# Patient Record
Sex: Female | Born: 1935
Health system: Southern US, Community
[De-identification: ages and names within clinical notes are randomized; demographics above are authoritative.]

## PROBLEM LIST (undated history)

## (undated) DIAGNOSIS — M545 Low back pain, unspecified: Secondary | ICD-10-CM

## (undated) DIAGNOSIS — K5790 Diverticulosis of intestine, part unspecified, without perforation or abscess without bleeding: Secondary | ICD-10-CM

## (undated) DIAGNOSIS — E039 Hypothyroidism, unspecified: Secondary | ICD-10-CM

## (undated) DIAGNOSIS — K219 Gastro-esophageal reflux disease without esophagitis: Secondary | ICD-10-CM

## (undated) DIAGNOSIS — H9193 Unspecified hearing loss, bilateral: Secondary | ICD-10-CM

## (undated) DIAGNOSIS — K7689 Other specified diseases of liver: Secondary | ICD-10-CM

## (undated) DIAGNOSIS — I5032 Chronic diastolic (congestive) heart failure: Secondary | ICD-10-CM

## (undated) DIAGNOSIS — I447 Left bundle-branch block, unspecified: Secondary | ICD-10-CM

## (undated) DIAGNOSIS — R0600 Dyspnea, unspecified: Secondary | ICD-10-CM

## (undated) DIAGNOSIS — J069 Acute upper respiratory infection, unspecified: Secondary | ICD-10-CM

## (undated) DIAGNOSIS — I509 Heart failure, unspecified: Secondary | ICD-10-CM

## (undated) DIAGNOSIS — K449 Diaphragmatic hernia without obstruction or gangrene: Secondary | ICD-10-CM

## (undated) DIAGNOSIS — I2510 Atherosclerotic heart disease of native coronary artery without angina pectoris: Secondary | ICD-10-CM

## (undated) DIAGNOSIS — K635 Polyp of colon: Secondary | ICD-10-CM

## (undated) DIAGNOSIS — R002 Palpitations: Secondary | ICD-10-CM

## (undated) DIAGNOSIS — I059 Rheumatic mitral valve disease, unspecified: Secondary | ICD-10-CM

## (undated) DIAGNOSIS — E785 Hyperlipidemia, unspecified: Secondary | ICD-10-CM

## (undated) DIAGNOSIS — I4891 Unspecified atrial fibrillation: Secondary | ICD-10-CM

## (undated) DIAGNOSIS — I10 Essential (primary) hypertension: Secondary | ICD-10-CM

## (undated) HISTORY — DX: Unspecified hearing loss, bilateral: H91.93

## (undated) HISTORY — DX: Low back pain: M54.5

## (undated) HISTORY — PX: TOTAL ABDOMINAL HYSTERECTOMY W/ BILATERAL SALPINGOOPHORECTOMY: SHX83

## (undated) HISTORY — DX: Hypothyroidism, unspecified: E03.9

## (undated) HISTORY — DX: Chronic diastolic (congestive) heart failure: I50.32

## (undated) HISTORY — DX: Low back pain, unspecified: M54.50

## (undated) HISTORY — PX: TRANSTHORACIC ECHOCARDIOGRAM: SHX275

## (undated) HISTORY — DX: Dyspnea, unspecified: R06.00

## (undated) HISTORY — DX: Hyperlipidemia, unspecified: E78.5

## (undated) HISTORY — DX: Palpitations: R00.2

## (undated) HISTORY — PX: THYROIDECTOMY: SHX17

## (undated) HISTORY — DX: Other specified diseases of liver: K76.89

## (undated) HISTORY — PX: HERNIA REPAIR: SHX51

## (undated) HISTORY — DX: Rheumatic mitral valve disease, unspecified: I05.9

## (undated) HISTORY — PX: TONSILLECTOMY AND ADENOIDECTOMY: SUR1326

## (undated) HISTORY — DX: Left bundle-branch block, unspecified: I44.7

## (undated) HISTORY — DX: Polyp of colon: K63.5

## (undated) HISTORY — DX: Diaphragmatic hernia without obstruction or gangrene: K44.9

## (undated) HISTORY — DX: Essential (primary) hypertension: I10

## (undated) HISTORY — PX: COLONOSCOPY: SHX174

## (undated) HISTORY — DX: Heart failure, unspecified: I50.9

## (undated) HISTORY — DX: Acute upper respiratory infection, unspecified: J06.9

## (undated) HISTORY — DX: Unspecified atrial fibrillation: I48.91

## (undated) HISTORY — DX: Diverticulosis of intestine, part unspecified, without perforation or abscess without bleeding: K57.90

## (undated) HISTORY — DX: Gastro-esophageal reflux disease without esophagitis: K21.9

## (undated) HISTORY — DX: Atherosclerotic heart disease of native coronary artery without angina pectoris: I25.10

---

## 1998-10-17 ENCOUNTER — Emergency Department (HOSPITAL_COMMUNITY): Admission: EM | Admit: 1998-10-17 | Discharge: 1998-10-17 | Payer: Self-pay | Admitting: Emergency Medicine

## 1998-10-17 ENCOUNTER — Encounter: Payer: Self-pay | Admitting: Emergency Medicine

## 1998-12-06 ENCOUNTER — Ambulatory Visit (HOSPITAL_COMMUNITY): Admission: RE | Admit: 1998-12-06 | Discharge: 1998-12-06 | Payer: Self-pay | Admitting: Gastroenterology

## 1998-12-06 ENCOUNTER — Encounter: Payer: Self-pay | Admitting: Gastroenterology

## 1998-12-06 ENCOUNTER — Encounter (INDEPENDENT_AMBULATORY_CARE_PROVIDER_SITE_OTHER): Payer: Self-pay | Admitting: *Deleted

## 1999-04-20 ENCOUNTER — Encounter: Payer: Self-pay | Admitting: Internal Medicine

## 1999-04-20 ENCOUNTER — Emergency Department (HOSPITAL_COMMUNITY): Admission: EM | Admit: 1999-04-20 | Discharge: 1999-04-20 | Payer: Self-pay | Admitting: *Deleted

## 1999-05-03 ENCOUNTER — Encounter: Admission: RE | Admit: 1999-05-03 | Discharge: 1999-06-28 | Payer: Self-pay | Admitting: Family Medicine

## 1999-05-04 ENCOUNTER — Encounter: Payer: Self-pay | Admitting: Family Medicine

## 1999-05-04 ENCOUNTER — Encounter: Admission: RE | Admit: 1999-05-04 | Discharge: 1999-05-04 | Payer: Self-pay | Admitting: Family Medicine

## 1999-06-23 ENCOUNTER — Ambulatory Visit (HOSPITAL_COMMUNITY): Admission: RE | Admit: 1999-06-23 | Discharge: 1999-06-23 | Payer: Self-pay | Admitting: Neurology

## 1999-06-23 ENCOUNTER — Encounter: Payer: Self-pay | Admitting: Neurology

## 1999-08-16 ENCOUNTER — Encounter: Admission: RE | Admit: 1999-08-16 | Discharge: 1999-08-16 | Payer: Self-pay | Admitting: Neurology

## 1999-08-16 ENCOUNTER — Encounter: Payer: Self-pay | Admitting: Neurology

## 1999-11-09 ENCOUNTER — Encounter: Admission: RE | Admit: 1999-11-09 | Discharge: 1999-12-07 | Payer: Self-pay | Admitting: Neurology

## 2000-04-17 ENCOUNTER — Encounter: Payer: Self-pay | Admitting: Orthopedic Surgery

## 2000-04-17 ENCOUNTER — Encounter: Admission: RE | Admit: 2000-04-17 | Discharge: 2000-04-17 | Payer: Self-pay | Admitting: Orthopedic Surgery

## 2001-04-05 DIAGNOSIS — I2510 Atherosclerotic heart disease of native coronary artery without angina pectoris: Secondary | ICD-10-CM

## 2001-04-05 HISTORY — DX: Atherosclerotic heart disease of native coronary artery without angina pectoris: I25.10

## 2001-04-15 ENCOUNTER — Encounter: Payer: Self-pay | Admitting: Cardiology

## 2001-04-15 ENCOUNTER — Ambulatory Visit (HOSPITAL_COMMUNITY): Admission: RE | Admit: 2001-04-15 | Discharge: 2001-04-15 | Payer: Self-pay | Admitting: Cardiology

## 2001-04-18 ENCOUNTER — Ambulatory Visit (HOSPITAL_COMMUNITY): Admission: RE | Admit: 2001-04-18 | Discharge: 2001-04-18 | Payer: Self-pay | Admitting: Cardiology

## 2001-04-18 HISTORY — PX: CARDIAC CATHETERIZATION: SHX172

## 2001-04-18 HISTORY — PX: LEFT HEART CATH AND CORONARY ANGIOGRAPHY: CATH118249

## 2002-08-05 ENCOUNTER — Encounter: Payer: Self-pay | Admitting: Internal Medicine

## 2002-10-13 ENCOUNTER — Encounter: Payer: Self-pay | Admitting: Internal Medicine

## 2002-11-17 ENCOUNTER — Encounter: Admission: RE | Admit: 2002-11-17 | Discharge: 2002-11-17 | Payer: Self-pay | Admitting: Family Medicine

## 2002-11-17 ENCOUNTER — Encounter: Payer: Self-pay | Admitting: Family Medicine

## 2003-05-04 ENCOUNTER — Emergency Department (HOSPITAL_COMMUNITY): Admission: EM | Admit: 2003-05-04 | Discharge: 2003-05-04 | Payer: Self-pay | Admitting: Emergency Medicine

## 2003-05-04 ENCOUNTER — Encounter (INDEPENDENT_AMBULATORY_CARE_PROVIDER_SITE_OTHER): Payer: Self-pay | Admitting: *Deleted

## 2003-11-19 ENCOUNTER — Encounter: Admission: RE | Admit: 2003-11-19 | Discharge: 2003-11-19 | Payer: Self-pay | Admitting: Family Medicine

## 2004-01-06 ENCOUNTER — Ambulatory Visit: Payer: Self-pay | Admitting: Family Medicine

## 2004-01-24 ENCOUNTER — Ambulatory Visit: Payer: Self-pay | Admitting: Family Medicine

## 2004-02-10 ENCOUNTER — Ambulatory Visit: Payer: Self-pay | Admitting: Family Medicine

## 2004-05-09 ENCOUNTER — Ambulatory Visit: Payer: Self-pay | Admitting: Family Medicine

## 2004-06-07 ENCOUNTER — Ambulatory Visit: Payer: Self-pay | Admitting: *Deleted

## 2004-06-14 ENCOUNTER — Ambulatory Visit: Payer: Self-pay | Admitting: *Deleted

## 2004-06-14 ENCOUNTER — Ambulatory Visit: Payer: Self-pay

## 2004-09-19 ENCOUNTER — Ambulatory Visit: Payer: Self-pay | Admitting: Family Medicine

## 2004-12-12 ENCOUNTER — Ambulatory Visit: Payer: Self-pay | Admitting: *Deleted

## 2004-12-18 ENCOUNTER — Ambulatory Visit: Payer: Self-pay | Admitting: *Deleted

## 2005-01-02 ENCOUNTER — Ambulatory Visit (HOSPITAL_COMMUNITY): Admission: RE | Admit: 2005-01-02 | Discharge: 2005-01-02 | Payer: Self-pay | Admitting: *Deleted

## 2005-01-02 ENCOUNTER — Ambulatory Visit: Payer: Self-pay | Admitting: Internal Medicine

## 2005-05-09 ENCOUNTER — Ambulatory Visit: Payer: Self-pay | Admitting: *Deleted

## 2005-05-23 ENCOUNTER — Ambulatory Visit: Payer: Self-pay | Admitting: Family Medicine

## 2005-11-21 ENCOUNTER — Ambulatory Visit: Payer: Self-pay | Admitting: *Deleted

## 2005-12-04 ENCOUNTER — Ambulatory Visit: Payer: Self-pay

## 2005-12-11 ENCOUNTER — Ambulatory Visit: Payer: Self-pay | Admitting: Family Medicine

## 2005-12-14 ENCOUNTER — Ambulatory Visit: Payer: Self-pay | Admitting: Family Medicine

## 2006-05-29 ENCOUNTER — Ambulatory Visit: Payer: Self-pay | Admitting: *Deleted

## 2006-05-29 LAB — CONVERTED CEMR LAB
AST: 23 units/L (ref 0–37)
Albumin: 4 g/dL (ref 3.5–5.2)
Bilirubin, Direct: 0.1 mg/dL (ref 0.0–0.3)
Chloride: 105 meq/L (ref 96–112)
Cholesterol: 235 mg/dL (ref 0–200)
GFR calc Af Amer: 104 mL/min
GFR calc non Af Amer: 86 mL/min
Glucose, Bld: 104 mg/dL — ABNORMAL HIGH (ref 70–99)
HDL: 40.9 mg/dL (ref >39.0)
Total Bilirubin: 0.8 mg/dL (ref 0.3–1.2)
Total Protein: 6.7 g/dL (ref 6.0–8.3)

## 2006-06-27 ENCOUNTER — Ambulatory Visit: Payer: Self-pay | Admitting: Family Medicine

## 2006-06-27 LAB — CONVERTED CEMR LAB
Basophils Relative: 0.3 % (ref 0.0–1.0)
Eosinophils Absolute: 0.1 10*3/uL (ref 0.0–0.6)
Hemoglobin: 14.1 g/dL (ref 12.0–15.0)
Monocytes Absolute: 0.4 10*3/uL (ref 0.2–0.7)
Monocytes Relative: 6.4 % (ref 3.0–11.0)
Neutro Abs: 2.7 10*3/uL (ref 1.4–7.7)
Platelets: 237 10*3/uL (ref 150–400)

## 2006-07-24 ENCOUNTER — Encounter: Admission: RE | Admit: 2006-07-24 | Discharge: 2006-07-24 | Payer: Self-pay | Admitting: Family Medicine

## 2006-07-30 ENCOUNTER — Ambulatory Visit: Payer: Self-pay | Admitting: *Deleted

## 2006-07-30 LAB — CONVERTED CEMR LAB
AST: 24 units/L (ref 0–37)
Albumin: 4.1 g/dL (ref 3.5–5.2)
Alkaline Phosphatase: 61 units/L (ref 39–117)
Total Bilirubin: 1.2 mg/dL (ref 0.3–1.2)

## 2006-10-24 ENCOUNTER — Ambulatory Visit: Payer: Self-pay | Admitting: Cardiology

## 2006-12-02 DIAGNOSIS — I5033 Acute on chronic diastolic (congestive) heart failure: Secondary | ICD-10-CM

## 2006-12-02 DIAGNOSIS — I5042 Chronic combined systolic (congestive) and diastolic (congestive) heart failure: Secondary | ICD-10-CM | POA: Insufficient documentation

## 2006-12-02 DIAGNOSIS — M545 Low back pain: Secondary | ICD-10-CM

## 2006-12-02 DIAGNOSIS — E039 Hypothyroidism, unspecified: Secondary | ICD-10-CM

## 2006-12-02 DIAGNOSIS — I2510 Atherosclerotic heart disease of native coronary artery without angina pectoris: Secondary | ICD-10-CM | POA: Insufficient documentation

## 2006-12-02 DIAGNOSIS — I5043 Acute on chronic combined systolic (congestive) and diastolic (congestive) heart failure: Secondary | ICD-10-CM | POA: Insufficient documentation

## 2007-02-21 ENCOUNTER — Telehealth (INDEPENDENT_AMBULATORY_CARE_PROVIDER_SITE_OTHER): Payer: Self-pay | Admitting: *Deleted

## 2007-02-25 ENCOUNTER — Ambulatory Visit: Payer: Self-pay | Admitting: Internal Medicine

## 2007-02-25 DIAGNOSIS — J069 Acute upper respiratory infection, unspecified: Secondary | ICD-10-CM | POA: Insufficient documentation

## 2007-04-22 ENCOUNTER — Ambulatory Visit: Payer: Self-pay | Admitting: Cardiology

## 2007-05-09 ENCOUNTER — Ambulatory Visit: Payer: Self-pay

## 2007-05-26 ENCOUNTER — Ambulatory Visit: Payer: Self-pay | Admitting: Cardiology

## 2007-05-29 ENCOUNTER — Ambulatory Visit: Payer: Self-pay

## 2007-08-12 ENCOUNTER — Ambulatory Visit: Payer: Self-pay | Admitting: Family Medicine

## 2007-08-12 DIAGNOSIS — E785 Hyperlipidemia, unspecified: Secondary | ICD-10-CM

## 2007-08-12 DIAGNOSIS — R0602 Shortness of breath: Secondary | ICD-10-CM | POA: Insufficient documentation

## 2007-08-12 LAB — CONVERTED CEMR LAB
ALT: 16 units/L (ref 0–35)
AST: 23 units/L (ref 0–37)
Alkaline Phosphatase: 61 units/L (ref 39–117)
Bilirubin, Direct: 0.1 mg/dL (ref 0.0–0.3)
CO2: 25 meq/L (ref 19–32)
Calcium: 9.5 mg/dL (ref 8.4–10.5)
Chloride: 106 meq/L (ref 96–112)
Cholesterol: 228 mg/dL (ref 0–200)
Glucose, Bld: 87 mg/dL (ref 70–99)
HDL: 42.7 mg/dL (ref >39.0)
Hemoglobin: 13.7 g/dL (ref 12.0–15.0)
Lymphocytes Relative: 35 % (ref 12.0–46.0)
Monocytes Relative: 6.4 % (ref 3.0–12.0)
Neutro Abs: 3.2 10*3/uL (ref 1.4–7.7)
Neutrophils Relative %: 55.6 % (ref 43.0–77.0)
RBC: 4.02 M/uL (ref 3.87–5.11)
RDW: 12.8 % (ref 11.5–14.6)
Sodium: 141 meq/L (ref 135–145)
TSH: 1.9 microintl units/mL (ref 0.35–5.50)
Total Bilirubin: 1.1 mg/dL (ref 0.3–1.2)
Total CHOL/HDL Ratio: 5.3
Total Protein: 6.9 g/dL (ref 6.0–8.3)
VLDL: 17 mg/dL (ref 0–40)

## 2007-08-14 ENCOUNTER — Telehealth: Payer: Self-pay | Admitting: Family Medicine

## 2007-09-04 ENCOUNTER — Encounter: Admission: RE | Admit: 2007-09-04 | Discharge: 2007-09-04 | Payer: Self-pay | Admitting: Family Medicine

## 2007-10-09 ENCOUNTER — Telehealth (INDEPENDENT_AMBULATORY_CARE_PROVIDER_SITE_OTHER): Payer: Self-pay | Admitting: *Deleted

## 2007-11-07 ENCOUNTER — Ambulatory Visit: Payer: Self-pay | Admitting: Cardiology

## 2008-05-06 ENCOUNTER — Ambulatory Visit: Payer: Self-pay | Admitting: Cardiology

## 2008-05-14 ENCOUNTER — Encounter: Payer: Self-pay | Admitting: Cardiology

## 2008-05-14 ENCOUNTER — Ambulatory Visit: Payer: Self-pay

## 2008-06-19 DIAGNOSIS — R002 Palpitations: Secondary | ICD-10-CM

## 2008-06-19 DIAGNOSIS — I059 Rheumatic mitral valve disease, unspecified: Secondary | ICD-10-CM | POA: Insufficient documentation

## 2008-06-19 DIAGNOSIS — I447 Left bundle-branch block, unspecified: Secondary | ICD-10-CM | POA: Insufficient documentation

## 2008-06-19 DIAGNOSIS — I10 Essential (primary) hypertension: Secondary | ICD-10-CM | POA: Insufficient documentation

## 2008-08-17 ENCOUNTER — Ambulatory Visit: Payer: Self-pay | Admitting: Internal Medicine

## 2008-08-17 ENCOUNTER — Encounter: Payer: Self-pay | Admitting: Family Medicine

## 2008-08-27 ENCOUNTER — Telehealth: Payer: Self-pay | Admitting: *Deleted

## 2008-09-15 ENCOUNTER — Telehealth: Payer: Self-pay | Admitting: Family Medicine

## 2008-10-01 ENCOUNTER — Encounter (INDEPENDENT_AMBULATORY_CARE_PROVIDER_SITE_OTHER): Payer: Self-pay | Admitting: *Deleted

## 2008-11-18 ENCOUNTER — Ambulatory Visit: Payer: Self-pay | Admitting: Cardiology

## 2008-12-13 ENCOUNTER — Ambulatory Visit: Payer: Self-pay | Admitting: Family Medicine

## 2008-12-13 DIAGNOSIS — H906 Mixed conductive and sensorineural hearing loss, bilateral: Secondary | ICD-10-CM | POA: Insufficient documentation

## 2008-12-13 LAB — CONVERTED CEMR LAB
Ketones, urine, test strip: NEGATIVE
Nitrite: NEGATIVE
Protein, U semiquant: NEGATIVE
Urobilinogen, UA: 0.2

## 2008-12-15 LAB — CONVERTED CEMR LAB
ALT: 18 units/L (ref 0–35)
AST: 23 units/L (ref 0–37)
Alkaline Phosphatase: 56 units/L (ref 39–117)
Basophils Absolute: 0 10*3/uL (ref 0.0–0.1)
Calcium: 9.4 mg/dL (ref 8.4–10.5)
Eosinophils Relative: 2.9 % (ref 0.0–5.0)
GFR calc non Af Amer: 63.68 mL/min (ref >60)
HCT: 39.6 % (ref 36.0–46.0)
HDL: 38.8 mg/dL — ABNORMAL LOW (ref >39.00)
Hemoglobin: 13.7 g/dL (ref 12.0–15.0)
Lymphocytes Relative: 37.1 % (ref 12.0–46.0)
Lymphs Abs: 1.9 10*3/uL (ref 0.7–4.0)
Monocytes Relative: 7.1 % (ref 3.0–12.0)
Neutro Abs: 2.7 10*3/uL (ref 1.4–7.7)
Platelets: 190 10*3/uL (ref 150.0–400.0)
Potassium: 4.3 meq/L (ref 3.5–5.1)
RDW: 12.7 % (ref 11.5–14.6)
Sodium: 141 meq/L (ref 135–145)
TSH: 3.8 microintl units/mL (ref 0.35–5.50)
Total Bilirubin: 0.9 mg/dL (ref 0.3–1.2)
Triglycerides: 142 mg/dL (ref 0.0–149.0)
VLDL: 28.4 mg/dL (ref 0.0–40.0)
WBC: 5.1 10*3/uL (ref 4.5–10.5)

## 2008-12-24 ENCOUNTER — Encounter: Admission: RE | Admit: 2008-12-24 | Discharge: 2008-12-24 | Payer: Self-pay | Admitting: Family Medicine

## 2008-12-30 ENCOUNTER — Telehealth (INDEPENDENT_AMBULATORY_CARE_PROVIDER_SITE_OTHER): Payer: Self-pay | Admitting: *Deleted

## 2009-01-18 ENCOUNTER — Telehealth (INDEPENDENT_AMBULATORY_CARE_PROVIDER_SITE_OTHER): Payer: Self-pay | Admitting: *Deleted

## 2009-01-19 ENCOUNTER — Ambulatory Visit: Payer: Self-pay

## 2009-01-19 ENCOUNTER — Encounter (HOSPITAL_COMMUNITY): Admission: RE | Admit: 2009-01-19 | Discharge: 2009-03-03 | Payer: Self-pay | Admitting: Cardiology

## 2009-01-19 ENCOUNTER — Ambulatory Visit: Payer: Self-pay | Admitting: Cardiology

## 2009-02-15 ENCOUNTER — Telehealth: Payer: Self-pay | Admitting: Cardiology

## 2009-05-20 ENCOUNTER — Telehealth (INDEPENDENT_AMBULATORY_CARE_PROVIDER_SITE_OTHER): Payer: Self-pay | Admitting: *Deleted

## 2009-06-27 ENCOUNTER — Ambulatory Visit (HOSPITAL_COMMUNITY): Admission: RE | Admit: 2009-06-27 | Discharge: 2009-06-27 | Payer: Self-pay | Admitting: Cardiology

## 2009-06-27 ENCOUNTER — Ambulatory Visit: Payer: Self-pay | Admitting: Cardiology

## 2009-06-27 ENCOUNTER — Encounter: Payer: Self-pay | Admitting: Cardiology

## 2009-06-27 ENCOUNTER — Ambulatory Visit: Payer: Self-pay

## 2009-06-28 ENCOUNTER — Ambulatory Visit: Payer: Self-pay | Admitting: Family Medicine

## 2009-06-28 DIAGNOSIS — R1033 Periumbilical pain: Secondary | ICD-10-CM | POA: Insufficient documentation

## 2009-06-29 ENCOUNTER — Encounter (INDEPENDENT_AMBULATORY_CARE_PROVIDER_SITE_OTHER): Payer: Self-pay | Admitting: *Deleted

## 2009-06-29 ENCOUNTER — Telehealth: Payer: Self-pay | Admitting: Internal Medicine

## 2009-07-06 DIAGNOSIS — K224 Dyskinesia of esophagus: Secondary | ICD-10-CM | POA: Insufficient documentation

## 2009-07-06 DIAGNOSIS — Z860100 Personal history of colon polyps, unspecified: Secondary | ICD-10-CM | POA: Insufficient documentation

## 2009-07-06 DIAGNOSIS — K5730 Diverticulosis of large intestine without perforation or abscess without bleeding: Secondary | ICD-10-CM | POA: Insufficient documentation

## 2009-07-06 DIAGNOSIS — K219 Gastro-esophageal reflux disease without esophagitis: Secondary | ICD-10-CM

## 2009-07-06 DIAGNOSIS — K7689 Other specified diseases of liver: Secondary | ICD-10-CM

## 2009-07-06 DIAGNOSIS — K449 Diaphragmatic hernia without obstruction or gangrene: Secondary | ICD-10-CM | POA: Insufficient documentation

## 2009-07-07 ENCOUNTER — Ambulatory Visit: Payer: Self-pay | Admitting: Internal Medicine

## 2009-07-07 LAB — CONVERTED CEMR LAB: Creatinine, Ser: 0.7 mg/dL (ref 0.4–1.2)

## 2009-07-11 ENCOUNTER — Ambulatory Visit: Payer: Self-pay | Admitting: Internal Medicine

## 2009-07-15 ENCOUNTER — Ambulatory Visit: Payer: Self-pay | Admitting: Internal Medicine

## 2009-07-19 ENCOUNTER — Encounter: Payer: Self-pay | Admitting: Internal Medicine

## 2009-08-31 ENCOUNTER — Telehealth: Payer: Self-pay | Admitting: Family Medicine

## 2009-09-06 ENCOUNTER — Telehealth: Payer: Self-pay | Admitting: Family Medicine

## 2009-09-13 ENCOUNTER — Telehealth: Payer: Self-pay | Admitting: Family Medicine

## 2010-01-05 ENCOUNTER — Ambulatory Visit: Payer: Self-pay | Admitting: Family Medicine

## 2010-01-05 DIAGNOSIS — J309 Allergic rhinitis, unspecified: Secondary | ICD-10-CM | POA: Insufficient documentation

## 2010-01-18 ENCOUNTER — Encounter: Admission: RE | Admit: 2010-01-18 | Discharge: 2010-01-18 | Payer: Self-pay | Admitting: Nurse Practitioner

## 2010-02-02 ENCOUNTER — Telehealth: Payer: Self-pay | Admitting: Family Medicine

## 2010-03-25 ENCOUNTER — Encounter: Payer: Self-pay | Admitting: Family Medicine

## 2010-04-04 NOTE — Progress Notes (Signed)
Summary: rx refax  Phone Note Refill Request Message from:  Fax from Pharmacy  Refills Requested: Medication #1:  SYNTHROID 100 MCG TABS 1 once daily Initial call taken by: Kern Reap CMA Duncan Dull),  September 13, 2009 12:34 PM    Prescriptions: SYNTHROID 100 MCG TABS (LEVOTHYROXINE SODIUM) 1 once daily  #100 x 1   Entered by:   Kern Reap CMA (AAMA)   Authorized by:   Roderick Pee MD   Signed by:   Kern Reap CMA (AAMA) on 09/13/2009   Method used:   Re-Faxed to ...       [facility]       [address]       [place], [place]  [address]       Ph: [phone]       Fax: [phone]   RxID:   [id number]

## 2010-04-04 NOTE — Procedures (Signed)
Summary: COLONOSCOPY   Colonoscopy  Procedure date:  10/13/2002  Findings:      Location:  [facility].     Patient Name: Beverly Kim, Beverly Kim MRN:  Procedure Procedures: Colonoscopy CPT: 6187260138.  Personnel: Endoscopist: Naomy Esham L. Juanda Chance, MD.  Referred By: Eugenio Hoes Tawanna Cooler, MD.  Exam Location: Exam performed in Outpatient Clinic. Outpatient  Patient Consent: Procedure, Alternatives, Risks and Benefits discussed, consent obtained, from patient. Consent was obtained by the RN.  Indications Symptoms: Diarrhea Patient is having increased frequency of stools. Abdominal pain / bloating.  History  Pre-Exam Physical: Performed Oct 13, 2002. Cardio-pulmonary exam, Rectal exam, HEENT exam , Abdominal exam, Extremity exam, Neurological exam, Mental status exam WNL.  Exam Exam: Extent of exam reached: Cecum, extent intended: Cecum.  The cecum was identified by appendiceal orifice and IC valve. Colon retroflexion performed. Images taken. ASA Classification: I. Tolerance: good.  Monitoring: Pulse and BP monitoring, Oximetry used. Supplemental O2 given.  Colon Prep Used Golytely for colon prep. Prep results: good.  Sedation Meds: Patient assessed and found to be appropriate for moderate (conscious) sedation. Fentanyl 50 mcg. given IV. Versed 3 mg. given IV.  Findings NORMAL EXAM: Cecum.  - DIVERTICULOSIS: Sigmoid Colon. ICD9: Diverticulosis: 562.10. Comments: moderately severe.   Assessment Abnormal examination, see findings above.  Diagnoses: 562.10: Diverticulosis.   Comments: no polyps Events  Unplanned Interventions: No intervention was required.  Unplanned Events: There were no complications. Plans Medication Plan: Fiber supplements: Methylcellulose 1 Tbsp QD, starting Oct 13, 2002  Antispasmodics: Hyoscyamine PO .375mg  BID, starting Oct 13, 2002   Patient Education: Patient given standard instructions for: Diverticulosis. Yearly hemoccult testing  recommended. Patient instructed to get routine colonoscopy every 10 years.  Disposition: After procedure patient sent to recovery. After recovery patient sent home.    CC: Tinnie Gens A.Todd,MD  This report was created from the original endoscopy report, which was reviewed and signed by the above listed endoscopist.

## 2010-04-04 NOTE — Letter (Signed)
Summary: [facility] Instructions  [facility]  [address] [place], [place] [address]   Phone: [phone]  Fax: [phone]       Beverly Kim    [birth year]    MRN: [id number]       Procedure Day /Date: Friday 07/15/09     Arrival Time: 12:30 pm     Procedure Time: 1:30 pm     Location of Procedure:                    _x _   Endoscopy Center (4th Floor)  PREPARATION FOR COLONOSCOPY WITH MIRALAX  Starting 5 days prior to your procedure (07/10/09) do not eat nuts, seeds, popcorn, corn, beans, peas,  salads, or any raw vegetables.  Do not take any fiber supplements (e.g. Metamucil, Citrucel, and Benefiber). ____________________________________________________________________________________________________   THE DAY BEFORE YOUR PROCEDURE         DATE: 07/14/09 DAY: Thursday  1   Drink clear liquids the entire day-NO SOLID FOOD  2   Do not drink anything colored red or purple.  Avoid juices with pulp.  No orange juice.  3   Drink at least 64 oz. (8 glasses) of fluid/clear liquids during the day to prevent dehydration and help the prep work efficiently.  CLEAR LIQUIDS INCLUDE: Water Jello Ice Popsicles Tea (sugar ok, no milk/cream) Powdered fruit flavored drinks Coffee (sugar ok, no milk/cream) Gatorade Juice: apple, white grape, white cranberry  Lemonade Clear bullion, consomm, broth Carbonated beverages (any kind) Strained chicken noodle soup Hard Candy  4   Mix the entire bottle of Miralax with 64 oz. of Gatorade/Powerade in the morning and put in the refrigerator to chill.  5   At 3:00 pm take 2 Dulcolax/Bisacodyl tablets.  6   At 4:30 pm take one Reglan/Metoclopramide tablet.  7  Starting at 5:00 pm drink one 8 oz glass of the Miralax mixture every 15-20 minutes until you have finished drinking the entire 64 oz.  You should finish drinking prep around 7:30 or 8:00 pm.  8   If you are nauseated, you may take the 2nd Reglan/Metoclopramide tablet  at 6:30 pm.        9    At 8:00 pm take 2 more DULCOLAX/Bisacodyl tablets.         THE DAY OF YOUR PROCEDURE      DATE:  07/15/09 DAY: Friday  You may drink clear liquids until 6:30 am  (2 HOURS BEFORE PROCEDURE).   MEDICATION INSTRUCTIONS  Unless otherwise instructed, you should take regular prescription medications with a small sip of water as early as possible the morning of your procedure.       OTHER INSTRUCTIONS  You will need a responsible adult at least 75 years of age to accompany you and drive you home.   This person must remain in the waiting room during your procedure.  Wear loose fitting clothing that is easily removed.  Leave jewelry and other valuables at home.  However, you may wish to bring a book to read or an iPod/MP3 player to listen to music as you wait for your procedure to start.  Remove all body piercing jewelry and leave at home.  Total time from sign-in until discharge is approximately 2-3 hours.  You should go home directly after your procedure and rest.  You can resume normal activities the day after your procedure.  The day of your procedure you should not:   Drive   Make  legal decisions   Operate machinery   Drink alcohol   Return to work  You will receive specific instructions about eating, activities and medications before you leave.   The above instructions have been reviewed and explained to me by   Lamona Curl CMA Duncan Dull)  Jul 07, 2009 10:07 AM    I fully understand and can verbalize these instructions _____________________________ Date 07/07/09

## 2010-04-04 NOTE — Procedures (Signed)
Summary: ENDOSCOPY   EGD  Procedure date:  10/13/2002  Findings:      Location: [facility]     Patient Name: Beverly Kim, Beverly Kim MRN:  Procedure Procedures: Panendoscopy (EGD) CPT: 43235.    with biopsy(s)/brushing(s). CPT: D1846139.  Personnel: Endoscopist: Lillien Petronio L. Juanda Chance, MD.  Referred By: Eugenio Hoes Tawanna Cooler, MD.  Exam Location: Exam performed in Outpatient Clinic. Outpatient  Patient Consent: Procedure, Alternatives, Risks and Benefits discussed, consent obtained, from patient. Consent was obtained by the RN.  Indications Symptoms: Reflux symptoms for <1 yr, occurring <3 times/wk.  History  Pre-Exam Physical: Performed Oct 13, 2002  Cardio-pulmonary exam, HEENT exam, Abdominal exam, Extremity exam, Neurological exam, Mental status exam WNL.  Exam Exam Info: Maximum depth of insertion Duodenum, intended Duodenum. Vocal cords visualized. Gastric retroflexion performed. Images taken. ASA Classification: I. Tolerance: good.  Sedation Meds: Patient assessed and found to be appropriate for moderate (conscious) sedation. Fentanyl 50 mcg. given IV. Versed 5 mg. given IV. Cetacaine Spray 2 sprays given aerosolized.  Monitoring: BP and pulse monitoring done. Oximetry used. Supplemental O2 given  Findings DIAGNOSTIC TEST: from Antrum. RUT done, results pending Reason: r/o H. Pylori.   Assessment Normal examination.  Comments: positive CLO test, c/w H.Pylori infection Events  Unplanned Intervention: No unplanned interventions were required.  Unplanned Events: There were no complications. Plans Medication(s): PPI: Aciphex 20 mg BID, starting Oct 13, 2002  Biaxin: 500mg  BID, starting Oct 13, 2002  Amoxacillin: 1000mg  BID, starting Oct 13, 2002   Comments: Take H.Pylori regimen for 1 week Disposition: After procedure patient sent to recovery. After recovery patient sent home.    CC: Tinnie Gens A.Todd,MD   This report was created from the original  endoscopy report, which was reviewed and signed by the above listed endoscopist.

## 2010-04-04 NOTE — Assessment & Plan Note (Signed)
Summary: ab pain//ccm   Vital Signs:  Patient profile:   77 year old female Weight:      172 pounds Temp:     97.9 degrees F oral BP sitting:   118 / 68  (left arm) Cuff size:   regular  Vitals Entered By: Kern Reap CMA Duncan Dull) (June 28, 2009 12:42 PM) CC: lower abd pain Is Patient Diabetic? No Pain Assessment Patient in pain? yes        Primary Care Provider:  Dr. Tawanna Cooler  CC:  lower abd pain.  History of Present Illness: Beverly Kim is a 77 year old female, who comes in today with a 3 to 4 month history of mid to be crampy abdominal pain and loose bowel movements.  She's had no fever, chills, nausea, vomiting, diarrhea, weight loss, blood in her stools, etc.  Last colonoscopy 11 years ago by Dr. Dickie La.  She states she has one to two maybe 3 episodes a day.  They last for about 30 minutes and then go away.  She's also noticed loose bowel movements normally she goes once or twice a day.  Now.  She is going 5 to 10 times per day.  Allergies: 1)  ! Pcn 2)  ! Codeine 3)  ! Lipitor 4)  ! * Latex  Past History:  Past medical, surgical, family and social histories (including risk factors) reviewed for relevance to current acute and chronic problems.  Past Medical History: Reviewed history from 06/19/2008 and no changes required. LEFT BUNDLE BRANCH BLOCK (ICD-426.3) HYPERTENSION (ICD-401.9) PALPITATIONS (ICD-785.1) MITRAL VALVE PROLAPSE (ICD-424.0) DYSPNEA (ICD-786.05) HYPERLIPIDEMIA (ICD-272.4) URI (ICD-465.9) FAMILY HISTORY DIABETES 1ST DEGREE RELATIVE (ICD-V18.0) FAMILY HISTORY OF CAD FEMALE 1ST DEGREE RELATIVE <50 (ICD-V17.3) LOW BACK PAIN (ICD-724.2) HYPOTHYROIDISM (ICD-244.9) CORONARY ARTERY DISEASE (ICD-414.00) CONGESTIVE HEART FAILURE (ICD-428.0)  Past Surgical History: Reviewed history from 06/19/2008 and no changes required. Colonoscopy CB x4 TAH/BSO T&A Thyroidectomy Angiogram Cardiac Catheterization  04/18/01 [name]. Riley Kill, M.D. [facility]  Family  History: Reviewed history from 12/02/2006 and no changes required. Family History of CAD Female 1st degree relative <50 Family History Diabetes 1st degree relative Family History of Cardiovascular disorder Family History of Neurological disorder Family History of Respiratory disease  Social History: Reviewed history from 08/12/2007 and no changes required. Retired Single Never Smoked Alcohol use-no Regular exercise-yes  Review of Systems      See HPI  Physical Exam  General:  Well-developed,well-nourished,in no acute distress; alert,appropriate and cooperative throughout examination Abdomen:  Bowel sounds positive,abdomen soft and non-tender without masses, organomegaly or hernias noted. Rectal:  No external abnormalities noted. Normal sphincter tone. No rectal masses or tenderness.   Impression & Recommendations:  Problem # 1:  ABDOMINAL PAIN, PERIUMBILIC (ICD-789.05) Assessment New  Orders: Gastroenterology Referral (GI)  Complete Medication List: 1)  Lopressor 50 Mg Tabs (Metoprolol tartrate) .Marland Kitchen.. 1tab once daily 2)  Synthroid 100 Mcg Tabs (Levothyroxine sodium) .Marland Kitchen.. 1 once daily 3)  Caltrate 600+d 600-400 Mg-unit Tabs (Calcium carbonate-vitamin d) .Marland Kitchen.. 1 tab two times a day 4)  Adult Aspirin Low Strength 81 Mg Tbdp (Aspirin) .Marland Kitchen.. 1 once daily 5)  Enalapril Maleate 20 Mg Tabs (Enalapril maleate) .Marland Kitchen.. 1 once daily 6)  Multivitamins Tabs (Multiple vitamin) .... Qd 7)  Vitamin C 500 Mg Tabs (Ascorbic acid) .... Once daily 8)  Biotin 1000 Mcg Tabs (Biotin) .... Once daily 9)  Chlorhexidine Gluconate 0.12 % Soln (Chlorhexidine gluconate) .... Mouth rinse  - use as directed  Patient Instructions: 1)  we will put in a call  to your gastroenterologist, for consultation. 2)  Stay on a complete lactose free diet until you see Dr. Dickie La

## 2010-04-04 NOTE — Progress Notes (Signed)
Summary: triage   Phone Note Call from Patient Call back at Home Phone [phone]   Caller: Patient Call For: Juanda Chance Reason for Call: Talk to Nurse Summary of Call: Patient has a lot of abd pain and loose stools, can't wait until first available appt 6-6 was seen by Dr Tawanna Cooler and was told she needed to see her gi doctor. Initial call taken by: Tawni Levy,  June 29, 2009 2:31 PM  Follow-up for Phone Call        Pt. saw Dr.Todd 06-28-09 for abd.pain/diarrhea. Wants to see Dr.Tevis Conger sooner than 1st available.  Pt. will see Dr.Rozelle Caudle on 07-07-09 at 9am. Pt. instructed to call back as needed.  Follow-up by: Laureen Ochs LPN,  June 29, 2009 2:44 PM

## 2010-04-04 NOTE — Op Note (Signed)
Summary: Gastroenterology  Beverly Kim  MR#:  [id number] Page #   [facility]   GASTROENTEROLOGY OFFICE NOTE  NAME:  Beverly Kim, Beverly Kim   OFFICE NO:  [id number]  DATE:  08/05/02  DOB:  [birth year]  SUBJECTIVE:  The patient is a very nice 77 year old patient of Dr. Nelida Meuse whom we saw in 1999 for evaluation of rectal bleeding and a history of colon polyps.  She had moderately severe diverticulosis of the left colon at that time and was put on Citrucel and a high-fiber diet.  She is having increasing lower abdominal pain, especially postprandially, with a change in bowel habits to having 5 to 6 small solid bowel movements a day.  Usually her bowel habits are 1 or 2 bowel movements a day.  She also on occasion had nocturnal stool.  There has been no gross blood per rectum.  Another complaint has been gastroesophageal reflux, especially after meals and when bending over, for instance, while doing yard work.  Her upper GI series in 1999 showed a small hiatal hernia with some esophageal spasm and tertiary contraction.  PAST MEDICAL HISTORY:  ILLNESSES:  She is followed by Dr. Glennon Hamilton for heart problems.  Significant for thyroid replacement and thyroid surgery.  OPERATIONS:  Tonsillectomy and hernia, as well as vaginal hysterectomy.  MEDICATIONS:  Metoprolol 25 mg p.o. b.i.d.; enalapril maleate 5 mg p.o. q.d.; Synthroid 0.1 mg q.d.; Premarin 0.625 mg q.d.; Lipitor 20 mg p.o. q.d.; aspirin 81 mg p.o. q.d.; Caltrate 600 mg b.i.d., and Fosamax once a week, which she just started two weeks ago.  ALLERGIES:  Penicillin and latex.  SOCIAL HISTORY:  No smoking.  No alcohol use.  She is widowed and has children.  FAMILY HISTORY:  Positive for heart disease.  REVIEW OF SYSTEMS:  Positive for severe fatigue, voice changes and headaches.  OBJECTIVE:  Blood pressure 102/82; pulse 74; weight 167 pounds.  She was alert, oriented and very pleasant.  Skin was warm and dry.  There was no palmar erythema.  Lungs were  clear to auscultation.  COR with normal S1; normal S2.  Abdomen was soft with decreased muscle tone and normoactive bowel sounds.  She has tenderness in both the left and right middle quadrants but no palpable mass or rebound.  The liver edge was at the costal margin.  Rectal exam showed normal rectal tone.  Stool was soft and Hemoccult negative.  IMPRESSION:   19.  A 77 year old white female with lower abdominal pain and history of moderately severe diverticulosis of the colon, as of colonoscopy five years ago. 2.  Change in bowel habits to frequent loose stools.  This could be related to symptomatic diverticulosis, to drug effect of Lipitor, or possibly due to irritable bowel syndrome. 3.  Gastroesophageal reflux.  PLAN:   1.  I have asked her to decrease fiber just in case she has a narrow left colon due to severe diverticulosis.  She may not be able to tolerate the amount of fiber she is trying to eat. 2.  Nu-Lev 0.125 mg to take sublingually p.r.n. abdominal pain. 3.  Colonoscopy is scheduled for September 08, 2002, in our office at 2:00 p.m.  She will also have upper endoscopy because of symptoms of reflux. 4.  Pepcid AC 10 mg taken at bed time, as needed.  Depending on the results of the endoscopy, she may need a stronger regimen combined with anti-reflux measures.    Hedwig Morton. Juanda Chance, M.D.  [id number] cc:  Dr. Kelle Darting  D:  08/05/02; T:  ; Job [phone]

## 2010-04-04 NOTE — Letter (Signed)
Summary: New Patient letter  [facility]  [address] [place], [place] [address]   Phone: [phone]  Fax: [phone]       06/29/2009 MRN: [id number]  [facility] [address] [place], [place]  [address]  Dear Ms. [name],  Welcome to the Gastroenterology Division at [facility].    You are scheduled to see Dr.  Lina Sar on 07-07-09 at 9am,  on the 3rd floor at [facility], [address]  We ask that you try to arrive at our office 15 minutes prior to your appointment time to allow for check-in.  We would like you to complete the enclosed self-administered evaluation form prior to your visit and bring it with you on the day of your appointment.  We will review it with you.  Also, please bring a complete list of all your medications or, if you prefer, bring the medication bottles and we will list them.  Please bring your insurance card so that we may make a copy of it.  If your insurance requires a referral to see a specialist, please bring your referral form from your primary care physician.  Co-payments are due at the time of your visit and may be paid by cash, check or credit card.     Your office visit will consist of a consult with your physician (includes a physical exam), any laboratory testing he/she may order, scheduling of any necessary diagnostic testing (e.g. x-ray, ultrasound, CT-scan), and scheduling of a procedure (e.g. Endoscopy, Colonoscopy) if required.  Please allow enough time on your schedule to allow for any/all of these possibilities.    If you cannot keep your appointment, please call [phone] to cancel or reschedule prior to your appointment date.  This allows [place] the opportunity to schedule an appointment for another patient in need of care.  If you do not cancel or reschedule by 5 p.m. the business day prior to your appointment date, you will be charged a $50.00 late cancellation/no-show fee.    Thank you for choosing   Gastroenterology for your medical needs.  We appreciate the opportunity to care for you.  Please visit [place] at our website  to learn more about our practice.                     Sincerely,                                                             The Gastroenterology Division   Appended Document: New Patient letter Letter mailed to patient.

## 2010-04-04 NOTE — Progress Notes (Signed)
Summary: synthroid  Phone Note Refill Request Message from:  Fax from Pharmacy on September 06, 2009 3:49 PM  Refills Requested: Medication #1:  SYNTHROID 100 MCG TABS 1 once daily Initial call taken by: Kern Reap CMA Duncan Dull),  September 06, 2009 3:49 PM    Prescriptions: SYNTHROID 100 MCG TABS (LEVOTHYROXINE SODIUM) 1 once daily  #100 x 1   Entered by:   Kern Reap CMA (AAMA)   Authorized by:   Roderick Pee MD   Signed by:   Kern Reap CMA (AAMA) on 09/06/2009   Method used:   Electronically to        [facility]       [address]       [place], [place]  [address]       Ph: [phone]       Fax: [phone]   RxID:   [id number]

## 2010-04-04 NOTE — Progress Notes (Signed)
Summary: refill  Phone Note Refill Request Message from:  Fax from Pharmacy on February 02, 2010 2:09 PM  Refills Requested: Medication #1:  ENALAPRIL MALEATE 20 MG TABS 1 once daily Initial call taken by: Kern Reap CMA Duncan Dull),  February 02, 2010 2:09 PM    Prescriptions: ENALAPRIL MALEATE 20 MG TABS (ENALAPRIL MALEATE) 1 once daily  #90 x 0   Entered by:   Kern Reap CMA (AAMA)   Authorized by:   Roderick Pee MD   Signed by:   Kern Reap CMA (AAMA) on 02/02/2010   Method used:   Faxed to ...       [facility] (mail-order)             , [place]         Ph: [phone]       Fax: [phone]   RxID:   [id number]

## 2010-04-04 NOTE — Progress Notes (Signed)
   Phone Note Outgoing Call   Call placed by: Scherrie Bateman, LPN,  May 20, 2009 12:43 PM Call placed to: Patient Summary of Call: PT DUE FOR ECHO AND APPT WITH DR WALL 6 MON DUE MARCH  NEED TO SCHEDULE. CALLED PT WAS DISCONNECTED  DURING CONVERSATION STATED DIFFER INS DID NOT THINK WAS ACCEPTED AT OUR OFF. LEFT MESSAGE FOR PT TO CALL BACK. Initial call taken by: Scherrie Bateman, LPN,  May 20, 2009 12:45 PM  Follow-up for Phone Call        [facility] Scherrie Bateman, LPN  May 25, 2009 12:41 PM   pt called back unable to reach you in POD Jamie Little  May 25, 2009 12:54 PM  PT TO CALL ON WED WITH INS INFO UNABLE TO LOCATE CARD AT THIS TIME. Follow-up by: Scherrie Bateman, LPN,  May 30, 2009 4:53 PM  Additional Follow-up for Phone Call Additional follow up Details #1::        SPOKE TO [facility] IN BILLING. OFFICE  DOES ACCEPT [employer]  .PLEASE SCHEDULE PT APPT AND ECHO WITH DR WALL DUE IN [facility] .UNABLE TO REACH PT BY PHONE WILL TRY LATER. Additional Follow-up by: Scherrie Bateman, LPN,  June 01, 2009 1:53 PM     Appended Document:  ECHO AND APPT WITH DR WALL SCHEDULED ON 06/27/09 AT 1:00 AND 1:45

## 2010-04-04 NOTE — Assessment & Plan Note (Signed)
Summary: ABD. PAIN/DIARRHEA               [facility]   History of Present Illness Visit Type: Follow-up Visit Primary GI MD: Lina Sar MD Primary Provider: Dr. Tawanna Cooler Requesting Provider: Kelle Darting, MD Chief Complaint: lower abdominal pain--"feels raw", feels like she wants to "expel a baby" History of Present Illness:   This is an 77 year old white female with lower abdominal pain predominantly in the left lower quadrant and frequent bowel movements up to 6 a day which are usually formed at the beginning and subsequently become loose. There has been no blood per rectum. She feels almost constant pressure in the pelvis. She has a history of irritable bowel syndrome and symptomatic diverticulosis. A colonoscopy in 1999 showed moderately severe diverticulosis. This was confirmed on a repeat colonoscopy in August 2004. She was then treated with Metamucil and Levbid 0.375 mg bid. Other medical problems include high blood pressure, hyperlipidemia, mitral valve prolapse, left bundle branch block, hypothyroidism. She is status post TAH/BSO. She denies any urinary symptoms.   GI Review of Systems    Reports abdominal pain, acid reflux, belching, dysphagia with liquids, dysphagia with solids, heartburn, and  nausea.     Location of  Abdominal pain: lower abdomen.    Denies bloating, chest pain, loss of appetite, vomiting, vomiting blood, weight loss, and  weight gain.      Reports black tarry stools, constipation, and  diarrhea.     Denies anal fissure, change in bowel habit, diverticulosis, fecal incontinence, heme positive stool, hemorrhoids, irritable bowel syndrome, jaundice, light color stool, liver problems, rectal bleeding, and  rectal pain.    Current Medications (verified): 1)  Lopressor 50 Mg Tabs (Metoprolol Tartrate) .Marland Kitchen.. 1tab Once Daily 2)  Synthroid 100 Mcg Tabs (Levothyroxine Sodium) .Marland Kitchen.. 1 Once Daily 3)  Caltrate 600+d 600-400 Mg-Unit  Tabs (Calcium Carbonate-Vitamin D) .Marland Kitchen.. 1 Tab Two  Times A Day 4)  Adult Aspirin Low Strength 81 Mg  Tbdp (Aspirin) .Marland Kitchen.. 1 Once Daily 5)  Enalapril Maleate 20 Mg Tabs (Enalapril Maleate) .Marland Kitchen.. 1 Once Daily 6)  Multivitamins   Tabs (Multiple Vitamin) .... Qd 7)  Vitamin C 500 Mg  Tabs (Ascorbic Acid) .... Once Daily 8)  Biotin 1000 Mcg Tabs (Biotin) .... Once Daily 9)  Chlorhexidine Gluconate 0.12 % Soln (Chlorhexidine Gluconate) .... Mouth Rinse  - Use As Directed  Allergies (verified): 1)  ! Pcn 2)  ! Codeine 3)  ! Lipitor 4)  ! Simvastatin 5)  ! Clindamycin 6)  ! * Latex  Past History:  Past Medical History: Reviewed history from 06/19/2008 and no changes required. LEFT BUNDLE BRANCH BLOCK (ICD-426.3) HYPERTENSION (ICD-401.9) PALPITATIONS (ICD-785.1) MITRAL VALVE PROLAPSE (ICD-424.0) DYSPNEA (ICD-786.05) HYPERLIPIDEMIA (ICD-272.4) URI (ICD-465.9) FAMILY HISTORY DIABETES 1ST DEGREE RELATIVE (ICD-V18.0) FAMILY HISTORY OF CAD FEMALE 1ST DEGREE RELATIVE <50 (ICD-V17.3) LOW BACK PAIN (ICD-724.2) HYPOTHYROIDISM (ICD-244.9) CORONARY ARTERY DISEASE (ICD-414.00) CONGESTIVE HEART FAILURE (ICD-428.0)  Past Surgical History: Reviewed history from 07/06/2009 and no changes required. Colonoscopy CB x4 TAH/BSO T&A Thyroidectomy Angiogram Cardiac Catheterization  04/18/01 [name]. Riley Kill, M.D. [facility] Hernia Repair  Family History: Reviewed history from 07/06/2009 and no changes required. Family History of CAD Female 1st degree relative <50 Family History Diabetes 1st degree relative Family History of Cardiovascular disorder Family History of Neurological disorder Family History of Respiratory disease No FH of Colon Cancer:  Social History: Widowed, 3 boys, 1 girl Retired Never Smoked Alcohol use-no Regular exercise-yes Daily Caffeine Use 2 cups  Review of  Systems       The patient complains of allergy/sinus, arthritis/joint pain, cough, fatigue, night sweats, shortness of breath, sleeping problems, and vision changes.  The  patient denies anemia, anxiety-new, back pain, blood in urine, breast changes/lumps, confusion, coughing up blood, depression-new, fainting, fever, headaches-new, hearing problems, heart murmur, heart rhythm changes, itching, menstrual pain, muscle pains/cramps, nosebleeds, pregnancy symptoms, skin rash, sore throat, swelling of feet/legs, swollen lymph glands, thirst - excessive, urination - excessive, urination changes/pain, urine leakage, and voice change.         Pertinent positive and negative review of systems were noted in the above HPI. All other ROS was otherwise negative.   Vital Signs:  Patient profile:   77 year old female Height:      62.5 inches Weight:      174 pounds BMI:     31.43 Pulse rate:   64 / minute Pulse rhythm:   regular BP sitting:   120 / 70  (left arm) Cuff size:   regular  Vitals Entered By: Francee Piccolo CMA Duncan Dull) (Jul 07, 2009 9:26 AM)  Physical Exam  General:  hard or hearing. Alert and oriented. Eyes:  PERRLA, no icterus. Mouth:  No deformity or lesions, dentition normal. Neck:  status post thyroidectomy. No bruit. Lungs:  Clear throughout to auscultation. Heart:  frequent premature heartbeats, no murmur. Abdomen:  soft mildly obese abdomen with mild tenderness in left lower quadrant without palpable mass or fullness. Post hysterectomy scar. Liver edge at costal margin. Rectal:  normal perianal area, normal rectal tone, small amount of soft Hemoccult negative stool. Extremities:  No clubbing, cyanosis, edema or deformities noted. Skin:  Intact without significant lesions or rashes. Psych:  Alert and cooperative. Normal mood and affect.   Impression & Recommendations:  Problem # 1:  DIVERTICULOSIS, COLON (ICD-562.10)  Patient has symptomatic diverticulosis of the left colon confirmed on 2 prior colonoscopies. We need to rule out diverticulitis or a partial colon obstruction due to progressive diverticular disease. We also need to rule out a  pelvic mass. We will proceed with a CT scan of the abdomen and pelvis and also with colonoscopy. I have discussed this and explained to the patient symptoms of severe diverticulosis. We will try to increase the fiber with Benefiber 1 tablespoon and Bentyl 10 mg 3 times a day before meals.  Orders: Colonoscopy (Colon) CT Abdomen/Pelvis with Contrast (CT Abd/Pelvis w/con)  Problem # 2:  Hx of ESOPHAGEAL SPASM (ICD-530.5) Patient has a history of gastroesophageal reflux. She has occasional burning and dysphagia. A barium esophagram in 2000 showed intermittent tertiary contractions. A 13 mm tablet passed without delay. She has a small hiatal hernia. An upper abdominal ultrasound in October 2000 showed a 1.5 cm liver cyst in the right lobe of the liver.  Other Orders: TLB-BUN (Urea Nitrogen) (84520-BUN) TLB-Creatinine, Blood (82565-CREA)  Patient Instructions: 1)  Bentyl 10 mg p.o. t.i.d. a.c. 2)  High-fiber diet. 3)  Benefiber 1 tablespoon daily. 4)  Colonoscopy with routine colonoscopy prep. 5)  CT Scan of the abdomen and pelvis to rule out pelvic mass. 6)  Renal profile prior to CT scan. 7)  Copy sent to : Dr Tawanna Cooler 8)  The medication list was reviewed and reconciled.  All changed / newly prescribed medications were explained.  A complete medication list was provided to the patient / caregiver. Prescriptions: DULCOLAX 5 MG  TBEC (BISACODYL) Day before procedure take 2 at 3pm and 2 at 8pm.  #4 x 0  Entered by:   Lamona Curl CMA (AAMA)   Authorized by:   Hart Carwin MD   Signed by:   Lamona Curl CMA (AAMA) on 07/07/2009   Method used:   Electronically to        [facility] (retail)       [address]       [place], [place]  [address]       Ph: [phone]       Fax: [phone]   RxID:   [id number] REGLAN 10 MG  TABS (METOCLOPRAMIDE HCL) As per prep instructions.  #2 x 0   Entered by:   Lamona Curl CMA (AAMA)   Authorized  by:   Hart Carwin MD   Signed by:   Lamona Curl CMA (AAMA) on 07/07/2009   Method used:   Electronically to        [facility] (retail)       [address]       [place], [place]  [address]       Ph: [phone]       Fax: [phone]   RxID:   [id number] MIRALAX   POWD (POLYETHYLENE GLYCOL 3350) As per prep  instructions.  #255gm x 0   Entered by:   Lamona Curl CMA (AAMA)   Authorized by:   Hart Carwin MD   Signed by:   Lamona Curl CMA (AAMA) on 07/07/2009   Method used:   Electronically to        [facility] (retail)       [address]       [place], [place]  [address]       Ph: [phone]       Fax: [phone]   RxID:   [id number] BENTYL 10 MG CAPS (DICYCLOMINE HCL) Take 1 capsule by mouth three times a day before meals  #90 x 2   Entered by:   Lamona Curl CMA (AAMA)   Authorized by:   Hart Carwin MD   Signed by:   Lamona Curl CMA (AAMA) on 07/07/2009   Method used:   Electronically to        [facility] (retail)       [address]       [place], [place]  [address]       Ph: [phone]       Fax: [phone]   RxID:   [id number]

## 2010-04-04 NOTE — Progress Notes (Signed)
Summary: synthroid refill  Phone Note Refill Request Message from:  Fax from Pharmacy on August 31, 2009 11:51 AM  Refills Requested: Medication #1:  SYNTHROID 100 MCG TABS 1 once daily Initial call taken by: Kern Reap CMA Duncan Dull),  August 31, 2009 11:51 AM    Prescriptions: SYNTHROID 100 MCG TABS (LEVOTHYROXINE SODIUM) 1 once daily  #100 x 2   Entered by:   Kern Reap CMA (AAMA)   Authorized by:   Roderick Pee MD   Signed by:   Kern Reap CMA (AAMA) on 08/31/2009   Method used:   Electronically to        [facility] (retail)       [address]       [place], [place]  [address]       Ph: [phone]       Fax: [phone]   RxID:   [id number]

## 2010-04-04 NOTE — Assessment & Plan Note (Signed)
Summary: f18m/jss  Medications Added LOPRESSOR 50 MG TABS (METOPROLOL TARTRATE) 1tab once daily CALTRATE 600+D 600-400 MG-UNIT  TABS (CALCIUM CARBONATE-VITAMIN D) 1 tab two times a day ENALAPRIL MALEATE 20 MG TABS (ENALAPRIL MALEATE) 1 once daily MULTIVITAMINS   TABS (MULTIPLE VITAMIN) qd VITAMIN C 500 MG  TABS (ASCORBIC ACID) once daily BIOTIN 1000 MCG TABS (BIOTIN) once daily      Allergies Added:   Visit Type:  Follow-up Primary Provider:  Dr. Tawanna Cooler   History of Present Illness: Beverly Kim comes in today for close followup of her hypertension. She did not increase her metoprolol as we ask her. She was quite honest about this today.  Nuclear stress study demonstrated normal left ventricular function with ejection fraction 64%, normal Beverly Kim motion except for the left bundle during infusion. There was no evidence of scar or ischemia. 2-D echocardiogram was done today and results are pending.  Her dyspnea on exertion is about the same. It is probably multifactorial.   Current Medications (verified): 1)  Lopressor 50 Mg Tabs (Metoprolol Tartrate) .Marland Kitchen.. 1tab Once Daily 2)  Synthroid 100 Mcg Tabs (Levothyroxine Sodium) .Marland Kitchen.. 1 Once Daily 3)  Caltrate 600+d 600-400 Mg-Unit  Tabs (Calcium Carbonate-Vitamin D) .Marland Kitchen.. 1 Tab Two Times A Day 4)  Adult Aspirin Low Strength 81 Mg  Tbdp (Aspirin) .Marland Kitchen.. 1 Once Daily 5)  Enalapril Maleate 10 Mg Tabs (Enalapril Maleate) .Marland Kitchen.. 1 Once Daily 6)  Multivitamins   Tabs (Multiple Vitamin) .... Qd 7)  Vitamin C 500 Mg  Tabs (Ascorbic Acid) .... Once Daily 8)  Biotin 1000 Mcg Tabs (Biotin) .... Once Daily  Allergies (verified): 1)  ! Pcn 2)  ! Codeine 3)  ! Lipitor 4)  ! * Latex  Past History:  Past Medical History: Last updated: 06/19/2008 LEFT BUNDLE BRANCH BLOCK (ICD-426.3) HYPERTENSION (ICD-401.9) PALPITATIONS (ICD-785.1) MITRAL VALVE PROLAPSE (ICD-424.0) DYSPNEA (ICD-786.05) HYPERLIPIDEMIA (ICD-272.4) URI (ICD-465.9) FAMILY HISTORY DIABETES  1ST DEGREE RELATIVE (ICD-V18.0) FAMILY HISTORY OF CAD FEMALE 1ST DEGREE RELATIVE <50 (ICD-V17.3) LOW BACK PAIN (ICD-724.2) HYPOTHYROIDISM (ICD-244.9) CORONARY ARTERY DISEASE (ICD-414.00) CONGESTIVE HEART FAILURE (ICD-428.0)  Past Surgical History: Last updated: 06/19/2008 Colonoscopy CB x4 TAH/BSO T&A Thyroidectomy Angiogram Cardiac Catheterization  04/18/01 Beverly Kim. Beverly Kim, M.D. [facility]  Family History: Last updated: 12/02/2006 Family History of CAD Female 1st degree relative <50 Family History Diabetes 1st degree relative Family History of Cardiovascular disorder Family History of Neurological disorder Family History of Respiratory disease  Social History: Last updated: 08/12/2007 Retired Single Never Smoked Alcohol use-no Regular exercise-yes  Risk Factors: Exercise: yes (08/12/2007)  Risk Factors: Smoking Status: never (12/02/2006)  Review of Systems       negative other than history of present illness  Vital Signs:  Patient profile:   77 year old female Height:      62.5 inches Weight:      170 pounds BMI:     30.71 Pulse rate:   75 / minute BP sitting:   138 / 70  (left arm)  Vitals Entered By: [name] CMA (June 27, 2009 2:04 PM)  Physical Exam  General:  obese.   Head:  normocephalic and atraumatic Eyes:  PERRLA/EOM intact; conjunctiva and lids normal. Neck:  Neck supple, no JVD. No masses, thyromegaly or abnormal cervical nodes. Chest Maciej Schweitzer:  no deformities or breast masses noted Lungs:  Clear bilaterally to auscultation and percussion. Heart:  Non-displaced PMI, chest non-tender; regular rate and rhythm, S1, S2 without murmurs, rubs or gallops. Carotid upstroke normal, no bruit. Normal abdominal aortic  size, no bruits. Femorals normal pulses, no bruits. Pedals normal pulses. No edema, no varicosities. Msk:  Back normal, normal gait. Muscle strength and tone normal. Pulses:  pulses normal in all 4 extremities Extremities:  No clubbing or  cyanosis. Neurologic:  Alert and oriented x 3. Skin:  Intact without lesions or rashes. Psych:  Normal affect.   Impression & Recommendations:  Problem # 1:  HYPERTENSION (ICD-401.9) Assessment Unchanged there has not been much change in her blood pressure. She is refused increase in metoprolol. A basket increase her enalapril to 20 mg once a day. Her updated medication list for this problem includes:    Lopressor 50 Mg Tabs (Metoprolol tartrate) .Marland Kitchen... 1tab once daily    Adult Aspirin Low Strength 81 Mg Tbdp (Aspirin) .Marland Kitchen... 1 once daily    Enalapril Maleate 20 Mg Tabs (Enalapril maleate) .Marland Kitchen... 1 once daily  Problem # 2:  LEFT BUNDLE BRANCH BLOCK (ICD-426.3) Assessment: Unchanged  Her updated medication list for this problem includes:    Lopressor 50 Mg Tabs (Metoprolol tartrate) .Marland Kitchen... 1tab once daily    Adult Aspirin Low Strength 81 Mg Tbdp (Aspirin) .Marland Kitchen... 1 once daily    Enalapril Maleate 20 Mg Tabs (Enalapril maleate) .Marland Kitchen... 1 once daily  Problem # 3:  DYSPNEA (ICD-786.05) Assessment: Unchanged  Her updated medication list for this problem includes:    Lopressor 50 Mg Tabs (Metoprolol tartrate) .Marland Kitchen... 1tab once daily    Adult Aspirin Low Strength 81 Mg Tbdp (Aspirin) .Marland Kitchen... 1 once daily    Enalapril Maleate 20 Mg Tabs (Enalapril maleate) .Marland Kitchen... 1 once daily  Problem # 4:  MITRAL VALVE PROLAPSE (ICD-424.0)  Her updated medication list for this problem includes:    Lopressor 50 Mg Tabs (Metoprolol tartrate) .Marland Kitchen... 1tab once daily    Enalapril Maleate 20 Mg Tabs (Enalapril maleate) .Marland Kitchen... 1 once daily  Problem # 5:  CORONARY ARTERY DISEASE (ICD-414.00) Assessment: Unchanged I have reviewed the findings of her stress nuclear study. At this point, no further workup of her coronary disease. Her blood pressure control and rate control emphasized. Her updated medication list for this problem includes:    Lopressor 50 Mg Tabs (Metoprolol tartrate) .Marland Kitchen... 1tab once daily    Adult Aspirin  Low Strength 81 Mg Tbdp (Aspirin) .Marland Kitchen... 1 once daily    Enalapril Maleate 20 Mg Tabs (Enalapril maleate) .Marland Kitchen... 1 once daily  CHF Assessment/Plan:      The patient's current weight is 170 pounds.  Her previous weight was 170 pounds.    Clinical Reports Reviewed:  Nuclear Study:  01/19/2009:  Excerise capacity: Lexiscan study  Blood Pressure response: Normal blood pressure response  Clinical symptoms: Stomach pain  ECG impression: IVCD, brief heart block during infusion  Overall impression: Normal stress nulcear study.  Marca Ancona, MD   05/29/2007:  exercise capacity:Adenosine study with no exercise blood pressure response: Normal BP response clinical symptoms: back and arm ache  ECG impression: No significant ST segment change suggestive of ischemia overall impression: Normal stress nuclear Study Dr. Willa Rough  12/04/2005:  exercise capacity: Adenosine study with no exercise blood pressure response: Normal BP response clinical symptoms chest tight ECG impression No significant ST Segment change suggestive of ischemia overall impression: There is mild decrease in activity at the apex with very slight reversibilty. There is very mild decrease in thickening at the apex. These findings have present on some of the studies in the past.I doubt any significant change. I doubt significant ischemia  Dr.Jeffrey Myrtis Ser  Patient Instructions: 1)  Your physician recommends that you schedule a follow-up appointment in: YEAR WITH DR Yarel Kilcrease 2)  Your physician has recommended you make the following change in your medication: INCREASE ENALAPRIL TO 20 MG once daily Prescriptions: ENALAPRIL MALEATE 20 MG TABS (ENALAPRIL MALEATE) 1 once daily  #30 x 11   Entered by:   Scherrie Bateman, LPN   Authorized by:   Gaylord Shih, MD, [facility]   Signed by:   Scherrie Bateman, LPN on 16/12/9602   Method used:   Electronically to        [facility] (retail)       [address]        [place], [place]  [address]       Ph: [phone]       Fax: [phone]   RxID:   [id number]

## 2010-04-04 NOTE — Assessment & Plan Note (Signed)
Summary: congestion/sob/dm   Vital Signs:  Patient profile:   77 year old female Weight:      174 pounds Temp:     98.1 degrees F oral BP sitting:   118 / 74  (left arm) Cuff size:   regular  Vitals Entered By: Kern Reap CMA Duncan Dull) (January 05, 2010 4:12 PM) CC: cough and drainage   Primary Care Provider:  Dr. Tawanna Cooler  CC:  cough and drainage.  History of Present Illness: Beverly Kim is an 77 year old single female, nonsmoker, who comes in today for evaluation of allergic rhinitis.  She has seasonal allergic rhinitis is usually worse in the fall.  She's been taking OTC Claritin, but only episodically.  She is concerned that regular Claritin will affect her blood pressure.  BP 118/74  Allergies: 1)  ! Pcn 2)  ! Codeine 3)  ! Lipitor 4)  ! Simvastatin 5)  ! Clindamycin 6)  ! * Latex  Past History:  Past medical, surgical, family and social histories (including risk factors) reviewed, and no changes noted (except as noted below).  Past Medical History: Reviewed history from 06/19/2008 and no changes required. LEFT BUNDLE BRANCH BLOCK (ICD-426.3) HYPERTENSION (ICD-401.9) PALPITATIONS (ICD-785.1) MITRAL VALVE PROLAPSE (ICD-424.0) DYSPNEA (ICD-786.05) HYPERLIPIDEMIA (ICD-272.4) URI (ICD-465.9) FAMILY HISTORY DIABETES 1ST DEGREE RELATIVE (ICD-V18.0) FAMILY HISTORY OF CAD FEMALE 1ST DEGREE RELATIVE <50 (ICD-V17.3) LOW BACK PAIN (ICD-724.2) HYPOTHYROIDISM (ICD-244.9) CORONARY ARTERY DISEASE (ICD-414.00) CONGESTIVE HEART FAILURE (ICD-428.0)  Past Surgical History: Reviewed history from 07/06/2009 and no changes required. Colonoscopy CB x4 TAH/BSO T&A Thyroidectomy Angiogram Cardiac Catheterization  04/18/01 [name]. Riley Kill, M.D. [facility] Hernia Repair  Family History: Reviewed history from 07/06/2009 and no changes required. Family History of CAD Female 1st degree relative <50 Family History Diabetes 1st degree relative Family History of Cardiovascular disorder Family  History of Neurological disorder Family History of Respiratory disease No FH of Colon Cancer:  Social History: Reviewed history from 07/07/2009 and no changes required. Widowed, 3 boys, 1 girl Retired Never Smoked Alcohol use-no Regular exercise-yes Daily Caffeine Use 2 cups  Review of Systems      See HPI  Physical Exam  General:  Well-developed,well-nourished,in no acute distress; alert,appropriate and cooperative throughout examination Head:  Normocephalic and atraumatic without obvious abnormalities. No apparent alopecia or balding. Eyes:  No corneal or conjunctival inflammation noted. EOMI. Perrla. Funduscopic exam benign, without hemorrhages, exudates or papilledema. Vision grossly normal. Ears:  External ear exam shows no significant lesions or deformities.  Otoscopic examination reveals clear canals, tympanic membranes are intact bilaterally without bulging, retraction, inflammation or discharge. Hearing is grossly normal bilaterally. Nose:  External nasal examination shows no deformity or inflammation. Nasal mucosa are pink and moist without lesions or exudates. Mouth:  Oral mucosa and oropharynx without lesions or exudates.  Teeth in good repair. Neck:  No deformities, masses, or tenderness noted. Chest Wall:  No deformities, masses, or tenderness noted. Lungs:  Normal respiratory effort, chest expands symmetrically. Lungs are clear to auscultation, no crackles or wheezes.   Problems:  Medical Problems Added: 1)  Dx of Rhinitis  (ICD-477.9)  Impression & Recommendations:  Problem # 1:  RHINITIS (ICD-477.9) Assessment New  Complete Medication List: 1)  Lopressor 50 Mg Tabs (Metoprolol tartrate) .Marland Kitchen.. 1tab once daily 2)  Synthroid 100 Mcg Tabs (Levothyroxine sodium) .Marland Kitchen.. 1 once daily 3)  Caltrate 600+d 600-400 Mg-unit Tabs (Calcium carbonate-vitamin d) .Marland Kitchen.. 1 tab two times a day 4)  Adult Aspirin Low Strength 81 Mg Tbdp (Aspirin) .Marland Kitchen.. 1 once  daily 5)  Enalapril  Maleate 20 Mg Tabs (Enalapril maleate) .Marland Kitchen.. 1 once daily 6)  Multivitamins Tabs (Multiple vitamin) .... Qd 7)  Vitamin C 500 Mg Tabs (Ascorbic acid) .... Once daily 8)  Biotin 1000 Mcg Tabs (Biotin) .... Once daily 9)  Chlorhexidine Gluconate 0.12 % Soln (Chlorhexidine gluconate) .... Mouth rinse  - use as directed 10)  Bentyl 10 Mg Caps (Dicyclomine hcl) .... Take 1 capsule by mouth three times a day before meals  Patient Instructions: 1)  take plain Claritin, 10 mg in the morning ........................or plain Zyrtec 10 mg at bedtime............. daily   Orders Added: 1)  Est. Patient Level III [28413]

## 2010-04-04 NOTE — Letter (Signed)
Summary: Patient Notice- Polyp Results  [facility]  [address] [place], [place] [address]   Phone: [phone]  Fax: [phone]        Jul 19, 2009 MRN: [id number]    Beverly Kim [address] [place], [place]  [address]    Dear Ms. [name],  I am pleased to inform you that the colon polyp(s) removed during your recent colonoscopy was (were) found to be benign (no cancer detected) upon pathologic examination.The polyp  consisted of normal tissue, no actual polyp tissue   Should you develop new or worsening symptoms of abdominal pain, bowel habit changes or bleeding from the rectum or bowels, please schedule an evaluation with either your primary care physician or with me.  Additional information/recommendations:  _x_ No further action with gastroenterology is needed at this time. Please      follow-up with your primary care physician for your other healthcare      needs.  __ Please call [phone] to schedule a return visit to review your      situation.  __ Please keep your follow-up visit as already scheduled.  __ Continue treatment plan as outlined the day of your exam.  Please call us if you are having persistent problems or have questions about your condition that have not been fully answered at this time.  Sincerely,  Hart Carwin MD  This letter has been electronically signed by your physician.  Appended Document: Patient Notice- Polyp Results letter mailed

## 2010-04-04 NOTE — Procedures (Signed)
Summary: Colonoscopy  Patient: [name] Note: All result statuses are Final unless otherwise noted.  Tests: (1) Colonoscopy (COL)   COL Colonoscopy           DONE     [facility]     [address]     [place], [place]  [address]           COLONOSCOPY PROCEDURE REPORT           PATIENT:  Beverly Kim, Beverly Kim  MR#:  [id number]     BIRTHDATE:  15-Feb-1925, 84 yrs. old  GENDER:  female     ENDOSCOPIST:  Hedwig Morton. Juanda Chance, MD     REF. BY:  Tinnie Gens A. Tawanna Cooler, M.D.     PROCEDURE DATE:  07/15/2009     PROCEDURE:  Colonoscopy 19147     ASA CLASS:  Class II     INDICATIONS:  abdominal pain, abnormal CT of abdomen rectal     pressure     hx of mod severe diverticulosis on colon 1999 and 2004     MEDICATIONS:   Versed 4 mg, Fentanyl 25 mcg           DESCRIPTION OF PROCEDURE:   After the risks benefits and     alternatives of the procedure were thoroughly explained, informed     consent was obtained.  Digital rectal exam was performed and     revealed no rectal masses.   The LB PCF-H180AL [id number] endoscope     was introduced through the anus and advanced to the cecum, which     was identified by both the appendix and ileocecal valve, without     limitations.  The quality of the prep was good, using MiraLax.     The instrument was then slowly withdrawn as the colon was fully     examined.     <<PROCEDUREIMAGES>>           FINDINGS:  Severe diverticulosis was found (see image1, image5,     image6, and image7). tortuous sigmoid colon with narroe lumen,     difficult to negotiate aaat 20 cm, due to sharp turn  A sessile     polyp was found. at 15 cm polyp vs fold With standard forceps,     biopsy was obtained and sent to pathology.  This was otherwise a     normal examination of the colon (see image8, image2, image3, and     image4).   Retroflexed views in the rectum revealed no     abnormalities.    The scope was then withdrawn from the patient     and the procedure completed.          COMPLICATIONS:  None     ENDOSCOPIC IMPRESSION:     1) Severe diverticulosis     2) Sessile polyp     3) Otherwise normal examination     partial sigmoid obstruction     RECOMMENDATIONS:     1) Await biopsy results     Metamucil 1 tsp dq, Levbid.375 mg po bid     REPEAT EXAM:  In 0 year(s) for.           ______________________________     Hedwig Morton. Juanda Chance, MD           CC:           n.     eSIGNED:   Hedwig Morton. Brodie at 07/15/2009 02:07 PM  [name], [name], [phone]  Note: An exclamation mark (!) indicates a result that was not dispersed into the flowsheet. Document Creation Date: 07/15/2009 2:07 PM _______________________________________________________________________  (1) Order result status: Final Collection or observation date-time: 07/15/2009 13:56 Requested date-time:  Receipt date-time:  Reported date-time:  Referring Physician:   Ordering Physician: Lina Sar [phone]) Specimen Source:  Source: Launa Grill Order Number: [id number] Lab site:   Appended Document: Colonoscopy no recall colon

## 2010-06-14 ENCOUNTER — Other Ambulatory Visit: Payer: Self-pay | Admitting: Cardiology

## 2010-06-22 ENCOUNTER — Encounter: Payer: Self-pay | Admitting: Cardiology

## 2010-06-23 ENCOUNTER — Encounter: Payer: Self-pay | Admitting: Cardiology

## 2010-06-23 ENCOUNTER — Ambulatory Visit (INDEPENDENT_AMBULATORY_CARE_PROVIDER_SITE_OTHER): Payer: 59 | Admitting: Cardiology

## 2010-06-23 VITALS — BP 118/68 | HR 67 | Ht 63.5 in | Wt 172.8 lb

## 2010-06-23 DIAGNOSIS — R0602 Shortness of breath: Secondary | ICD-10-CM

## 2010-06-23 MED ORDER — METOPROLOL TARTRATE 50 MG PO TABS: ORAL_TABLET | ORAL | Status: DC

## 2010-06-23 NOTE — Assessment & Plan Note (Signed)
Multifactorial. No change in recommendations. She will take lopressor 25mg  bid.

## 2010-06-23 NOTE — Progress Notes (Signed)
   Patient ID: Beverly Kim, female    DOB: [birth year], 77 y.o.   MRN: [id number]  HPI Beverly Kim returns for E and M of her dyspnea. This has been stable except for a recent bout of rhinitis evaluated by Beverly Kim. She does not take her Lopressor regularly. She wants to cut in two and take twice a day.  EKG shows NSR, first degree AVB, and NSIVCD.    Review of Systems  All other systems reviewed and are negative.      Physical Exam  Nursing note and vitals reviewed. Constitutional: She appears well-developed and well-nourished. No distress.       Obese, HOH  HENT:  Head: Normocephalic and atraumatic.  Eyes: EOM are normal.  Neck: Neck supple. No JVD present. No tracheal deviation present. No thyromegaly present.  Cardiovascular: Normal rate, regular rhythm, normal heart sounds and intact distal pulses.   Pulmonary/Chest: Breath sounds normal.  Abdominal: Soft. Bowel sounds are normal.  Musculoskeletal: She exhibits no edema.  Skin: Skin is warm and dry.  Psychiatric: She has a normal mood and affect.

## 2010-06-23 NOTE — Patient Instructions (Signed)
Your physician recommends that you schedule a follow-up appointment in: as needed with Dr. Daleen Squibb Your physician has recommended you make the following change in your medication: Decrease lopressor to 1/2 tablet twice a day

## 2010-07-18 NOTE — Assessment & Plan Note (Signed)
[facility] HEALTHCARE                            CARDIOLOGY OFFICE NOTE   Beverly Kim, Beverly Kim                      MRN:          [id number]  DATE:11/07/2007                            DOB:          31-Dec-1924    [name] returns today for further management of the following  problems.  1. Nonobstructive coronary artery disease.  2. Mitral valve prolapse.  3. History of tachy palpitation.  4. Hypertension.  5. Mixed hyperlipidemia.  6. Left bundle-branch block with a first-degree AV block.  She is      asymptomatic.   I saw her back in March.  We had a stress Myoview, which showed no  ischemia.  Normal ejection fraction.  She is having no angina at  present.   Her biggest complaint is she just feels weak and fatigued and achy, on  simvastatin.  She stopped it a week ago on her own, is already feeling  better.  She cannot take Lipitor because of leg cramps.  I doubt she  will tolerate any statin.   Her meds, otherwise, are unchanged.   Her exam today, her blood pressure is 120/70, pulse is 77 and regular.  Her weight is 171.  HEENT is normal.  She wears glasses.  Carotid  upstrokes are equal bilaterally without bruits.  No JVD.  Thyroid is not  enlarged.  Trachea is midline.  Lungs are clear.  Heart reveals a  regular rate and rhythm.  No gallop.  She has a paradoxically split S2.  Abdominal exam is soft.  Good bowel sounds.  No midline bruit.  Extremities reveal no edema.  Pulses are intact.  Neuro exam is intact.   It is unfortunate [name] cannot take a statin.  I do not think she  will tolerate any statin at this point in time.  I have asked her not to  resume her simvastatin.   She is concerned that she might be diabetic.  I told her to check with  Dr. Nelida Meuse since this is a major risk factor.  I do not see this in her  records.   I will plan on seeing her back in 6 months.     Thomas C. Daleen Squibb, MD, [facility]  Electronically Signed    TCW/MedQ  DD: 11/07/2007  DT: 11/08/2007  Job #: [id number]

## 2010-07-18 NOTE — Assessment & Plan Note (Signed)
[facility] HEALTHCARE                            CARDIOLOGY OFFICE NOTE   SAPPHIRE, TYGART                      MRN:          [id number]  DATE:04/22/2007                            DOB:          1925/02/26    Beverly Kim returns today.  I saw her initially in August 2008 to  become her cardiologist after Dr. Clarisa Kindred retirement.   Her problem list is listed on October 24, 2006.   She has been lately having some problems with pain in her legs below her  knees walking.  Her feet stay cold.   She has nonobstructive coronary disease with a 70% LAD stenosis.  Her  last stress Myoview in October 2007 showed no ischemia.  She will be due  a followup in 2009 in October.   She has mitral valve prolapse, palpitations, hypertension,  hyperlipidemia, left bundle branch block and first-degree AV block.  She  denies any presyncope or syncope.  She has really had no chest pain.   MEDICATIONS:  Her medications today are:  1. Metoprolol 50 mg a day.  2. Enalapril 5 mg a day.  3. Synthroid 100 mcg a day.  4. Aspirin 81 mg a day.  5. She carries sublingual nitroglycerin.   PHYSICAL EXAMINATION:  Her blood pressure today is 143/74.  Pulse is 63  and regular.  She has normal sinus rhythm with a first-degree AV block  and a left bundle, which is stable.  Her weight is 170.  HEENT:  Unchanged.  Carotid upstrokes are equal bilaterally without bruits.  No JVD.  Thyroid is not enlarged.  Trachea is midline.  There are no carotid  bruits.  LUNGS:  Clear.  HEART:  Reveals a normal S1-S2 without gallop.  ABDOMEN:  Soft, obese with good bowel sounds.  Organomegaly could not be  adequately assessed.  EXTREMITIES:  Reveal 1+ pitting edema.  Pulses are trace dorsalis pedis  bilaterally and 1+ posterior tibial.  She has very cold toes with a  decreased capillary refill.  She has significant dependent rubor.  She  has a lot of varicose veins.  There is no sign of DVT.  NEUROLOGIC:  Exam is intact.   ASSESSMENT AND PLAN:  Beverly Kim seems be stable from a cardiac  standpoint.  However, I am worried about her lower extremity findings  and her claudication.  She needs arterial Dopplers.   Will plan on obtaining those and see her back for further close followup  in a few weeks.  She will need a stress Myoview in October.     Thomas C. Daleen Squibb, MD, [facility]  Electronically Signed    TCW/MedQ  DD: 04/22/2007  DT: 04/23/2007  Job #: [id number]   cc:   Tinnie Gens A. Tawanna Cooler, MD

## 2010-07-18 NOTE — Assessment & Plan Note (Signed)
[facility] HEALTHCARE                            CARDIOLOGY OFFICE NOTE   Beverly Kim, Beverly Kim                      MRN:          [id number]  DATE:05/06/2008                            DOB:          06-11-1924    Beverly Kim comes in today.  Her biggest complaint is just shortness of  breath.  She denies orthopnea, PND, or peripheral edema.  Her blood  pressures have been under better control.   She is also wants to know she would tolerate a statin other than  simvastatin and Lipitor.  She has had severe problems with fatigue and  weakness and leg cramps in the past.  I doubt very serously if she would  tolerate it.  I think the risk and benefit is very low, also on over age  33.  We discussed this quite openly.   She had normal left ventricular function on a stress Myoview last year.  There was no ischemia and she had EF of 65%.  It has been quite some  time since we have done a 2-D echocardiogram, followup on her history of  mitral valve prolapse as well as her left bundle.  She probably has some  diastolic dysfunction.   She has had a dry cough, but I told there is no generic equivalent to  enalapril.   She is currently on;  1. Enalapril 5 mg per day.  2. Metoprolol 50 mg daily.  3. Synthroid 100 mcg daily.  4. Aspirin 81 mg a day.  5. Multivitamins and biotin.   Her blood pressure today is outstanding at 118/58, her heart rate is 66  and regular.  We did not repeat an EKG.  Her weight is 173 which is up 2  pounds.  It is fairly stable overall, however.  She is in no acute  distress.  Respiratory rate 18.  Skin color is normal.  There is no  cyanosis of her lips or fingertips.  HEENT:  Normal, otherwise.  Carotid  upstrokes were equal bilaterally without bruits.  Thyroid is not  enlarged.  Trachea is midline.  Lungs are clear to auscultation and  percussion.  There is no rhonchi or wheezes.  Heart  reveals a  nondisplaced PMI.  There is no obvious  click.  There is no major murmur,  S2 splits paradoxically with her left bundle.  Abdominal exam is soft,  good bowel sounds.  She is overweight.  There is no tenderness.  There  is no midline bruit.  Organomegaly was difficult to assess.  Extremities  reveal no cyanosis or clubbing.  There is absolutely no edema.  Pulses  are intact.  Neuro exam is intact.  Skin is unremarkable.   ASSESSMENT AND PLAN:  Beverly Kim major complaint is shortness of  breath.  This probably multifactorial, but she may have some diastolic  dysfunction.  For that reason, we repeated a 2-D echocardiogram.  She  would rather live with cough and had lower cost enalapril.  She had been  on it for a long time, and she was on a very low dose.  I have no  problem with  this.  I have told her, however, if the cough gets worse and the  shortness of breath is getting worse, we could try an ARB.  I will see  her back in 6 months.      Thomas C. Daleen Squibb, MD, [facility]  Electronically Signed    TCW/MedQ  DD: 05/06/2008  DT: 05/06/2008  Job #: [id number]

## 2010-07-18 NOTE — Assessment & Plan Note (Signed)
[facility] HEALTHCARE                            CARDIOLOGY OFFICE NOTE   STEFANNY, PIERI                      MRN:          [id number]  DATE:10/24/2006                            DOB:          [birth year]    [name] returns today to establish with me as her  cardiologist. She is a former patient of Dr. Clarisa Kindred.   Her problem list includes:  1. Non-obstructive coronary disease with 70% LAD. She has normal left      ventricular systolic function. She has some minimal dyspnea on      exertion, but this is unchanged. She is having no symptoms of      angina otherwise.  2. Mitral valve prolapse.  3. Palpitations.  4. Hypertension.  5. Hyperlipidemia.  6. Left bundle branch block.  7. First degree AV block.   She has a few palpitations, but otherwise no changes. She is a primary  care patient of Dr. Trey Paula Todd's.   CURRENT MEDICATIONS:  1. Metoprolol 50 mg daily.  2. Enalapril 5 mg daily.  3. Synthroid 100 mcg daily.  4. Aspirin 81 mg daily.  5. Multivitamin daily.  6. Vitamin C 500 mg daily.  7. Caltrate 600 mg daily.  8. Stannius fluoride.  9. Glucosamine.  10.Omega 3 fish oil.   VITAL SIGNS:  Blood pressure 142/86. She has not taken her medications  today. Her heart rate is 87. EKG shows sinus rhythm with a left bundle  and first degree AV block which is unchanged. Her weight is 168.  HEENT:  Normocephalic atraumatic, PERRLA, extraocular movements intact,  sclerae clear, facial symmetry is normal.  NECK:  Carotids are full. There is no JVD. Thyroid is not enlarged.  Trachea is midline.  LUNGS:  Clear.  HEART:  Reveals a paradoxical split S2.  ABDOMEN:  Protuberant with good bowel sounds.  EXTREMITIES:  No edema. Pulses are intact.  NEUROLOGIC:  Intact.   She had a stress Myoview on December 04, 2005 that showed no ischemia.   ASSESSMENT AND PLAN:  Beverly Kim is stable. I spent a considerable  amount of time talking to her  about her cardiac issues, particularly her  70% LAD lesion. We have prescribed sublingual nitroglycerin with  instructions on how to use it. We have also talked about acute coronary  syndrome symptoms and how to respond with 911. We will plan on seeing  her back in six months.     Thomas C. Daleen Squibb, MD, [facility]  Electronically Signed    TCW/MedQ  DD: 10/24/2006  DT: 10/25/2006  Job #: [id number]

## 2010-07-18 NOTE — Assessment & Plan Note (Signed)
[facility] HEALTHCARE                            CARDIOLOGY OFFICE NOTE   ANVITA, HIRATA                      MRN:          [id number]  DATE:05/26/2007                            DOB:          06/22/1924    Beverly Kim returns today for further manage of the following issues:  1. Nonobstructive coronary disease.  She has had as much as a 70% LAD      stenosis by cath in the past.  Her last stress Myoview was October      of 2007 with no ischemia.  Unfortunately, she is now having      episodes of chest tightness and shortness of breath.  Some of these      were exertion related, others were not.  She denies any orthopnea,      PND or peripheral edema.  2. Mitral valve prolapse.  3. History of palpitations.  4. Hypertension.  5. Hyperlipidemia.  6. Left bundle branch block with first-degree AV block.  She is having      no presyncope or syncope.   She is also complaining of her legs still hurting up to her calves.  Sometimes she gets horrendous cramps.  We performed arterial Dopplers on  May 09, 2007 because of pain in her legs.  Her ABIs were 1.1 and 1.1  bilaterally.  Her pulses are hard to feel, though her dorsalis pedis are  1+/4+ by my exam.   MEDICATIONS TODAY:  1. Enalapril 5 mg a day.  2. Synthroid 100 mcg a day.  3. Aspirin 81 mg a day.  4. B complex multivitamin.  5. Vitamin C.  6. Caltrate 600 a day.  7. Stannous fluoride.  8. Metoprolol extended release 50 mg a day.   She says that [employer] has changed her brand of metoprolol, and it  smells bad.  I told her that hopefully we would not have to switch her  to twice a day since there is a supply shortage with this particular  formula.   PHYSICAL EXAMINATION:  VITAL SIGNS:  Her blood pressure today is 151/84,  pulse 93 and regular.  Weight is 173, up 3.  HEENT:  Normocephalic, atraumatic.  Pupils are equal, round and reactive  to light and accommodation.  Extraocular movements  intact.  Sclerae are  clear.  Face symmetry is normal.  NECK:  Carotid upstrokes are equal bilaterally without bruits.  No JVD.  Thyroid is not enlarged.  Neck is supple.  LUNGS:  Clear to auscultation.  HEART:  A nondisplaced PMI.  Normal S1-S2 with a soft systolic murmur  along the left sternal border.  There is no gallop, there is no lift.  ABDOMEN:  Soft with good bowel sounds.  No midline bruit.  No  hepatomegaly.  No organomegaly.  EXTREMITIES:  No cyanosis, clubbing or edema.  Her dorsalis pedis is  1+/4+.  Posterior tibial was difficult to feel.  There were some venous  varicosities.  She has decreased capillary reflex and her toes are cool.  No sign of DVT.  NEUROLOGIC:  Intact.  SKIN:  Warm and dry.  Skin is unremarkable except for a few bruises.   ASSESSMENT/PLAN:  I had a long talk with [name] today.  I have  answered all questions which were numerous.  I have made the following  recommendations:  1. Adenosine rest stress to rule out any progressive obstructive      coronary disease.  2. Continue current medications.  3. Schedule visit which she is due with Dr. Tawanna Cooler for her annual      physical, blood work, as well as to discuss question of this being      neuropathy in her legs.  She says she is borderline diabetic on her      last blood sugar check.  4. Reassurance given about her arterial circulation in her legs.  We      spent a lot of time talking about this today.   I will plan on seeing her back in 6 months unless her stress Myoview  shows obstructive findings.     Thomas C. Daleen Squibb, MD, [facility]  Electronically Signed    TCW/MedQ  DD: 05/26/2007  DT: 05/26/2007  Job #: [id number]   cc:   Tinnie Gens A. Tawanna Cooler, MD

## 2010-07-21 NOTE — Assessment & Plan Note (Signed)
[facility] HEALTHCARE                              CARDIOLOGY OFFICE NOTE   KIASHA, Beverly Kim                      MRN:          [id number]  DATE:11/21/2005                            DOB:          04/27/1924    A very pleasant 77 year old white female with history of hypertension,  hyperlipidemia, mitral valve prolapse with bundle branch block.  Patient has  chronic dyspnea not related to exertion.  She states that she does have some  chest pressure when she carries her clothes after washing.  She also notes  this on occasion when initially starting her exercise but it resolves.  She  has known 70% lesion in the LAD and normal LV with 2+ mitral regurgitation  as per catheterization 2003.   She has a CPX which revealed normal functional capacity.  She had suggestion  of elevated filling pressures consistent with diastolic dysfunction.   MEDICATIONS:  1. Enalapril.  2. Synthroid.  3. Aspirin 81.  4. Caltrate.  5. Omega fish oil.  6. Vytorin 10/20 which she discontinued for a few months but restarted      recently because of elevated cholesterol.  7. Glucosamine.   PHYSICAL EXAMINATION:  GENERAL APPEARANCE:  Normal.  VITAL SIGNS:  Blood pressure 120/73, pulse 70 normal sinus rhythm.  NECK:  JVP is not elevated.  Carotid pulses palpable without bruits.  LUNGS:  Clear.  CARDIOVASCULAR:  No murmur.  EXTREMITIES:  No edema.   Patient does have known coronary disease and present symptoms certainly are  consistent with ischemia.  She is somewhat reluctant and I have suggested an  adenosine Myoview.  I should note that her EKG reveals first degree AV block  and left bundle branch block.   We plan to obtain the adenosine Myoview and I will see her back in 6 months  or p.r.n.                              E. Graceann Congress, MD, [facility]    EJL/MedQ  DD:  11/21/2005  DT:  11/22/2005  Job #:  [id number]

## 2010-07-21 NOTE — Cardiovascular Report (Signed)
[place]. [facility]  Patient:    Beverly Kim, Beverly Kim Visit Number: [id number] MRN: [id number]          Service Type: CAT Location: [facility] [id number] Attending Physician:  Ronaldo Miyamoto Dictated by:   [name] Riley Kill, M.D. [facility] Proc. Date: 04/18/01 Admit Date:  04/18/2001 Discharge Date: 04/18/2001   CC:         Evette Georges, M.D. [facility]  E. Graceann Congress, M.D. [facility]  Cardiac Catheterization Lab   Cardiac Catheterization  INDICATIONS:  Ms. Martorano is a 77 year old female who presents with some exertional chest discomfort.  She has had an abnormal Cardiolite and was seen by Dr. Corinda Gubler and set up for catheterization study.  PROCEDURES: 1. Left heart catheterization. 2. Selective coronary arteriography. 3. Selective left ventriculography.  DESCRIPTION OF PROCEDURE:  The patient was brought to the catheterization lab and prepped and draped in the usual fashion.  Through an anterior puncture, the right femoral artery was easily entered and a #6 Jamaica Daig sheath placed.  Ventriculography was then performed in the RAO projection.  With the standard Judkins JL4 catheter, there was damping in the left main and as a result of this, we pulled the catheter out and went ahead and took pictures of the right coronary artery.  Some nitroglycerin in the aorta was given to try to see if this would improve the situation.  Even with the diagnostic #6 Jamaica JL3.5 catheter, there was some damping in the left main.  We were never able to completely resolve the issue of damping in the left main and the use of a JL3.5 #6 [place] guide catheter was used to try to better opacify the vessel.  There was evidence of some tapered ostial narrowing with an angled takeoff of the left main of the aorta partially leading to the damping. Unfortunately, no sidehole guiding catheters, #3.5 [place] were available.  The patient overall tolerated the procedure well and there were no  complications.  Intravenous labetalol was given at the end of the procedure to try to bring the blood pressure down.  HEMODYNAMIC DATA: 1. Central aorta 190/89. 2. Left ventricle 182/21. 3. No gradient on pullback across the aortic valve.  ANGIOGRAPHIC DATA: 1. Left ventriculography was performed in the RAO projection.  There appeared    to be about 2+ mitral regurgitation.  Overall systolic function was    preserved.  Ejection fraction was calculated at about 63%. 2. The left main coronary artery had an upward takeoff with a 90-degree bend    just inside the ostium.  There was perhaps a 2-mm area at the ostium prior    to the bend.  This leaves a lumen of about just over 2 mm in size and    likely accounts for the cause of the damping. 3. The LAD has about a 70% area of smooth segmental narrowing in the mid    portion of the vessel after the diagonal.  The diagonal itself has a little    bit of irregularity and about 30% involves a bifurcational stenosis right    at this location.  The distal LAD wraps the apex and is free of critical    disease. 4. The circumflex proper consists of a large marginal branch and is free of    disease. 5. The right coronary artery provides a posterior descending and    posterolateral branches.  It is free of critical disease.  CONCLUSIONS: 1. Preserved overall left ventricular systolic  function. 2. ______ tapered narrowing of the left main partially due to angulation and    partially due to perhaps mild narrowing with a residual lumen of just over    2 mm. 3. A 50-70% stenosis in the mid portion of the left anterior descending    artery.  DISPOSITION:  I plan to review the films with Dr. Corinda Gubler.  We will need to make a decision as to whether intravascular ultrasound should be performed to better assess the left main and what should be the response.Dictated by: [name] Riley Kill, M.D. [facility] Attending Physician:  Ronaldo Miyamoto DD:   04/18/01 TD:  04/18/01 Job: [id number]

## 2010-07-21 NOTE — Assessment & Plan Note (Signed)
[facility]                            CARDIOLOGY OFFICE NOTE   MIEL, WISENER                      MRN:          [id number]  DATE:05/29/2006                            DOB:          Jul 10, 1924    Patient is a very pleasant 77 year old white female with history of  hypertension, hyperlipidemia, mitral valve prolapse, left bundle branch  block, 1st degree A-V block.  She has some chronic dyspnea.  Generally  has been feeling quite well.  She has had no symptoms of coronary  disease.  She does have known 70% lesion in the LAD with normal LV.  Two  plus mitral regurgitation at the time of catheterization 2003.   She had a stress Cardiolite December 04, 2005 revealing no significant  ischemia.  EF was 53%.   MEDICATIONS:  Include clindamycin, which she started prior to some  dental work.  She has a rash, and I suggested she discontinue it.  She  is on:  1. Metoprolol 50, which was changed Toprol 50.  2. Enalapril 5.  3. Synthroid 100.  4. Aspirin 81.   Blood pressure 137/72.  Pulse 67.  Normal sinus rhythm.  GENERAL APPEARANCE:  Normal.  JVP is not elevated.  Carotid pulses palpable without bruits.  LUNGS:  Clear.  CARDIAC EXAM:  Reveals no murmur or gallop.  ABDOMINAL EXAM:  Normal.  EXTREMITIES:  Reveal no edema.   IMPRESSION:  Diagnoses as above.  The patient is quite stable.  Her EKG  does reveal 1st degree A-V block and left bundle branch block, but this  has not changed in several years.   I suggested she continue on same therapy.  We will plan a BNP, lipids,  LFT.  I have suggested she follow up with Dr. Daleen Squibb in 4 to 5 months or  p.r.n.     E. Graceann Congress, MD, [facility]  Electronically Signed    EJL/MedQ  DD: 05/29/2006  DT: 05/29/2006  Job #: [id number]

## 2010-09-22 ENCOUNTER — Telehealth: Payer: Self-pay | Admitting: Cardiology

## 2010-09-22 NOTE — Telephone Encounter (Signed)
Pt needs refill on sinthroid qd and enalipril 20mg  qd faxed to [phone]. Pt is on vacation and needs this filled

## 2010-09-22 NOTE — Telephone Encounter (Signed)
Pt needs to obtain synthroid from primary md

## 2010-09-22 NOTE — Telephone Encounter (Signed)
Called pharmacy gave pt refills

## 2010-12-25 ENCOUNTER — Encounter: Payer: Self-pay | Admitting: Family Medicine

## 2010-12-25 ENCOUNTER — Ambulatory Visit (INDEPENDENT_AMBULATORY_CARE_PROVIDER_SITE_OTHER): Payer: 59 | Admitting: Family Medicine

## 2010-12-25 DIAGNOSIS — I10 Essential (primary) hypertension: Secondary | ICD-10-CM

## 2010-12-25 DIAGNOSIS — H906 Mixed conductive and sensorineural hearing loss, bilateral: Secondary | ICD-10-CM

## 2010-12-25 DIAGNOSIS — Z0000 Encounter for general adult medical examination without abnormal findings: Secondary | ICD-10-CM

## 2010-12-25 DIAGNOSIS — E039 Hypothyroidism, unspecified: Secondary | ICD-10-CM

## 2010-12-25 DIAGNOSIS — Z23 Encounter for immunization: Secondary | ICD-10-CM

## 2010-12-25 LAB — CBC WITH DIFFERENTIAL/PLATELET
Basophils Absolute: 0 10*3/uL (ref 0.0–0.1)
Eosinophils Absolute: 0.1 10*3/uL (ref 0.0–0.7)
Hemoglobin: 13.8 g/dL (ref 12.0–15.0)
Lymphocytes Relative: 31.2 % (ref 12.0–46.0)
Monocytes Relative: 7.4 % (ref 3.0–12.0)
Neutrophils Relative %: 59.3 % (ref 43.0–77.0)
Platelets: 242 10*3/uL (ref 150.0–400.0)
RDW: 14.1 % (ref 11.5–14.6)

## 2010-12-25 LAB — BASIC METABOLIC PANEL
Calcium: 9.6 mg/dL (ref 8.4–10.5)
GFR: 58.82 mL/min — ABNORMAL LOW (ref >60.00)
Potassium: 5.4 mEq/L — ABNORMAL HIGH (ref 3.5–5.1)
Sodium: 142 mEq/L (ref 135–145)

## 2010-12-25 LAB — POCT URINALYSIS DIPSTICK
Bilirubin, UA: NEGATIVE
Glucose, UA: NEGATIVE
Nitrite, UA: NEGATIVE
Urobilinogen, UA: 0.2

## 2010-12-25 LAB — LIPID PANEL
Cholesterol: 250 mg/dL — ABNORMAL HIGH (ref 0–200)
Total CHOL/HDL Ratio: 5
Triglycerides: 185 mg/dL — ABNORMAL HIGH (ref 0.0–149.0)
VLDL: 37 mg/dL (ref 0.0–40.0)

## 2010-12-25 LAB — HEPATIC FUNCTION PANEL
ALT: 20 U/L (ref 0–35)
AST: 27 U/L (ref 0–37)
Albumin: 4.6 g/dL (ref 3.5–5.2)
Alkaline Phosphatase: 56 U/L (ref 39–117)
Total Protein: 7.7 g/dL (ref 6.0–8.3)

## 2010-12-25 LAB — LDL CHOLESTEROL, DIRECT: Direct LDL: 165.4 mg/dL

## 2010-12-25 MED ORDER — METOPROLOL TARTRATE 50 MG PO TABS: 25.0000 mg | ORAL_TABLET | Freq: Two times a day (BID) | ORAL | Status: DC

## 2010-12-25 MED ORDER — ENALAPRIL MALEATE 20 MG PO TABS: 20.0000 mg | ORAL_TABLET | Freq: Every day | ORAL | Status: DC

## 2010-12-25 MED ORDER — LEVOTHYROXINE SODIUM 100 MCG PO TABS
100.0000 ug | ORAL_TABLET | Freq: Every day | ORAL | Status: DC
Start: 2010-12-25 — End: 2011-01-01

## 2010-12-25 NOTE — Progress Notes (Signed)
  Subjective:    Patient ID: Beverly Kim, female    DOB: 10/11/1924, 77 y.o.   MRN: [id number]  HPI Beverly Kim Is an 77 year old female, nonsmoker, who comes in today for Medicare wellness examination because of a history of hypertension, hypothyroidism, profound hearing loss, and a new problem of advanced bilateral cataracts with visual impairment.  Her cardiac status is stable.  She sees Dr. Daleen Squibb on a regular basis.  She takes Vasotec 20 mg daily and Lopressor 50 b.i.d. BP 110/68, pulse 60 and regular.  She takes Synthroid 100 mcg daily for hypothyroidism.  Check TSH level today.  She has a history of profound hearing loss.  She lost her hearing aid in her left ear.  Her cataracts are getting worse.  She now is having visual impairment, Dr. Roda Shutters is considering surgery.  This fall.  She does get routine eye care as noted above, hearing loss as noted above, regular dental care, BSE monthly, and a mammography, colonoscopy, normal, except for polyps.  Follow-up in GI, tetanus, 2005, Pneumovax, x 2, shingles 2009, seasonal flu shot today.  She functions independently.  Home health safety reviewed.  No issues identified, she walks on a daily basis, she does have a healthcare power of attorney, and a living well., cognitive function, normal,   Review of Systems  Constitutional: Negative.   HENT: Positive for hearing loss.   Eyes: Positive for visual disturbance.  Respiratory: Negative.   Cardiovascular: Negative.   Gastrointestinal: Negative.   Genitourinary: Negative.   Musculoskeletal: Negative.   Neurological: Negative.   Hematological: Negative.   Psychiatric/Behavioral: Negative.        Objective:   Physical Exam  Constitutional: She appears well-developed and well-nourished.  HENT:  Head: Normocephalic and atraumatic.  Right Ear: External ear normal.  Left Ear: External ear normal.  Nose: Nose normal.  Mouth/Throat: Oropharynx is clear and moist.  Eyes: EOM are normal.  Pupils are equal, round, and reactive to light.  Neck: Normal range of motion. Neck supple. No thyromegaly present.  Cardiovascular: Normal rate, regular rhythm, normal heart sounds and intact distal pulses.  Exam reveals no gallop and no friction rub.   No murmur heard. Pulmonary/Chest: Effort normal and breath sounds normal.  Abdominal: Soft. Bowel sounds are normal. She exhibits no distension and no mass. There is no tenderness. There is no rebound.  Genitourinary:       Bilateral breast exam normal  Musculoskeletal: Normal range of motion.  Lymphadenopathy:    She has no cervical adenopathy.  Neurological: She is alert. She has normal reflexes. No cranial nerve deficit. She exhibits normal muscle tone. Coordination normal.  Skin: Skin is warm and dry.  Psychiatric: She has a normal mood and affect. Her behavior is normal. Judgment and thought content normal.          Assessment & Plan:  Healthy female.  Hypertension continue the Vasotec 20 mg daily and Lopressor 50 b.i.d.  History of hypothyroidism.  Continue Synthroid 100 mcg daily.  Hearing loss purchase a new hearing aid for your left ear.  History of coronary disease, currently asymptomatic.  Continue aspirin tablet daily.  Bilateral cataracts followed by Dr. Eulah Pont.  Return in one year, sooner for any problems, seasonal flu shot today

## 2010-12-25 NOTE — Patient Instructions (Signed)
Continue your current medications.  Follow-up in one year or sooner if any problems 

## 2010-12-27 ENCOUNTER — Other Ambulatory Visit: Payer: Self-pay | Admitting: *Deleted

## 2010-12-27 DIAGNOSIS — E039 Hypothyroidism, unspecified: Secondary | ICD-10-CM

## 2010-12-27 MED ORDER — LEVOTHYROXINE SODIUM 25 MCG PO TABS: 25.0000 ug | ORAL_TABLET | Freq: Every day | ORAL | Status: DC

## 2011-01-01 ENCOUNTER — Other Ambulatory Visit: Payer: Self-pay | Admitting: *Deleted

## 2011-01-01 MED ORDER — LEVOTHYROXINE SODIUM 125 MCG PO CAPS: 1.0000 | ORAL_CAPSULE | Freq: Every day | ORAL | Status: DC

## 2011-04-10 ENCOUNTER — Ambulatory Visit (INDEPENDENT_AMBULATORY_CARE_PROVIDER_SITE_OTHER): Payer: 59 | Admitting: Family Medicine

## 2011-04-10 ENCOUNTER — Encounter: Payer: Self-pay | Admitting: Family Medicine

## 2011-04-10 DIAGNOSIS — E039 Hypothyroidism, unspecified: Secondary | ICD-10-CM

## 2011-04-10 DIAGNOSIS — M549 Dorsalgia, unspecified: Secondary | ICD-10-CM | POA: Insufficient documentation

## 2011-04-10 NOTE — Progress Notes (Signed)
  Subjective:    Patient ID: Beverly Kim, female    DOB: 1924/08/16, 78 y.o.   MRN: [id number]  HPI b. Is an 78 year old female who comes in today for evaluation of right lower back pain  She gives a very convoluted history. She states she was in [place] and aspirated something on December 5. She went to a low to urgent care had a chest x-ray and was told she had a pneumonia. She was given antibiotics. She felt well until 2 weeks ago when she noticed some discomfort in her right lower back. She is very vague about the type. She says it comes and goes then she'll say is constant. I think part of the difficulty is that her hearing aids don't work. She has no fever earache sore throat cough nausea vomiting or diarrhea. She otherwise felt well. Blood pressure today 170/80 she states she's taking her medication on a regular basis.  She has a history of congestive heart failure and coronary artery disease however she currently has no cardiac or pulmonary symptoms.    Review of Systems    general cardiovascular pulmonary as systems otherwise negative no history of trauma Objective:   Physical Exam Well-developed well-nourished female in no acute distress examination of the lungs shows the lungs to be clear. The soreness that she notes is in the right lumbar area. No palpable tenderness. Cardiac exam normal except for very distant heart sounds       Assessment & Plan:  Chest wall pain plan treat symptomatically with Motrin 600 mg twice a day  Elevated blood pressure with history of coronary disease and congestive heart failure referred back to cardiology

## 2011-04-10 NOTE — Patient Instructions (Signed)
Take 400 mg of Motrin twice daily with food  Call y   cardiologist today and set up an appointment for further evaluation of your blood pressure

## 2011-04-17 ENCOUNTER — Telehealth: Payer: Self-pay | Admitting: *Deleted

## 2011-04-17 DIAGNOSIS — I10 Essential (primary) hypertension: Secondary | ICD-10-CM

## 2011-04-17 DIAGNOSIS — E039 Hypothyroidism, unspecified: Secondary | ICD-10-CM

## 2011-04-17 DIAGNOSIS — H906 Mixed conductive and sensorineural hearing loss, bilateral: Secondary | ICD-10-CM

## 2011-04-17 NOTE — Telephone Encounter (Signed)
Patient is calling for her lab results.  She also would like a refill of her Synthroid (it has to be brand name) and her metoprolol 50 mg bid.

## 2011-04-18 MED ORDER — METOPROLOL TARTRATE 50 MG PO TABS: 50.0000 mg | ORAL_TABLET | Freq: Two times a day (BID) | ORAL | Status: DC

## 2011-04-18 MED ORDER — SYNTHROID 100 MCG PO TABS: 100.0000 ug | ORAL_TABLET | Freq: Every day | ORAL | Status: DC

## 2011-04-18 NOTE — Telephone Encounter (Signed)
Decrease the Synthroid to 100 mcg daily dispense 100 tabs 3 refills followup TSH level in 3 months okay to refill other meds

## 2011-04-18 NOTE — Telephone Encounter (Signed)
Spoke with patient and rx sent 

## 2012-01-04 HISTORY — PX: NM MYOVIEW LTD: HXRAD82

## 2012-01-29 ENCOUNTER — Encounter: Payer: Self-pay | Admitting: Physician Assistant

## 2012-01-29 ENCOUNTER — Ambulatory Visit (INDEPENDENT_AMBULATORY_CARE_PROVIDER_SITE_OTHER): Payer: 59 | Admitting: Physician Assistant

## 2012-01-29 VITALS — BP 110/70 | HR 62 | Ht 62.0 in | Wt 176.0 lb

## 2012-01-29 DIAGNOSIS — R0602 Shortness of breath: Secondary | ICD-10-CM

## 2012-01-29 DIAGNOSIS — R079 Chest pain, unspecified: Secondary | ICD-10-CM

## 2012-01-29 DIAGNOSIS — R002 Palpitations: Secondary | ICD-10-CM

## 2012-01-29 DIAGNOSIS — I2510 Atherosclerotic heart disease of native coronary artery without angina pectoris: Secondary | ICD-10-CM

## 2012-01-29 NOTE — Assessment & Plan Note (Addendum)
Patient is feeling like her heart is stopping her restarting and has associated shortness of breath with this. We will place a Holter monitor on her to see if she is having any arrhythmias. We'll also order a stress echo to further assess her LV function and rule out ischemia. Will also check labs.

## 2012-01-29 NOTE — Assessment & Plan Note (Signed)
Patient has history of nonobstructive coronary disease on catheter in 2003 with 50-70% stenosis in the mid portion of the left anterior descending.She has chronic exertional chest pressure and dyspnea that has not changed over the years. She is to have an dyspnea with some palpitations. We will order a stress echo to further assess

## 2012-01-29 NOTE — Progress Notes (Signed)
HPI:  This is a 78 year old white female patient who has history of nonobstructive coronary artery disease on catheter in 2003,50-70% stenosis in the mid portion of the left anterior descending artery. She had a 2-D echo in 2010 showed an ejection fraction of 45% with moderate LVH. Her last stress Myoview was in November 2010 which was normal,ejection fraction 64%. Her last 2-D echo 06/27/09 ejection fraction was 45-50% with grade 1 diastolic dysfunction.  The patient comes in today with multiple complaints. Her main one is she feels like her heart is stopping and her whole body shakes and restarts. She says this occurs when she is lying down or very quiet. It happens one time suddenly every couple days. She says this has been going on for about 2 months. She denies any rapid palpitations, irregularity, dizziness, or presyncope. She also states her blood pressure is been up and down when she checks it at home. Sometimes systolic is below 100 and other times it the 160s. She did not bring these readings with her. She also went to [place] for 15 days and says she had to use a wheelchair because she has chest tightness and shortness of breath if she overexerts herself. She's had this for years and it has not changed. She has been eating a lot of sauerkraut recently.  Allergies:  -- Atorvastatin    --  REACTION: Reaction not known  -- Clindamycin   -- Codeine   -- Latex   -- Penicillins   -- Simvastatin    --  REACTION: fatigue  Current Outpatient Prescriptions on File Prior to Visit: Ascorbic Acid (VITAMIN C) 500 MG tablet, Take 500 mg by mouth daily.  , Disp: , Rfl:  aspirin 81 MG tablet, Take 81 mg by mouth daily.  , Disp: , Rfl:  Biotin 1000 MCG tablet, Take 1,000 mcg by mouth daily.  , Disp: , Rfl:  Calcium Carbonate-Vitamin D (CALTRATE 600+D) 600-400 MG-UNIT per tablet, Take 1 tablet by mouth 2 (two) times daily.  , Disp: , Rfl:  enalapril (VASOTEC) 20 MG tablet, Take 1 tablet (20 mg total) by  mouth daily., Disp: 100 tablet, Rfl: 3 metoprolol (LOPRESSOR) 50 MG tablet, Take 1 tablet (50 mg total) by mouth 2 (two) times daily., Disp: 200 tablet, Rfl: 3 Multiple Vitamin (MULTIVITAMIN) tablet, Take 1 tablet by mouth daily.  , Disp: , Rfl:     Past Medical History:   Other left bundle branch block                               Hypertension                                                 Palpitations                                                 Mitral valve disorders                                       Dyspnea  Hyperlipidemia                                               URI (upper respiratory infection)                            Low back pain                                                Hypothyroidism                                               Coronary artery disease                                      CHF (congestive heart failure)                              Past Surgical History:   COLONOSCOPY                                                  cb                                                           cb                                                           TOTAL ABDOMINAL HYSTERECTOMY W/ BILATERAL SALP*              TONSILLECTOMY AND ADENOIDECTOMY                              THYROIDECTOMY                                                CARDIAC CATHETERIZATION                         04-18-01      HERNIA REPAIR  Review of patient's family history indicates:   Coronary artery disease                                   Comment: family hx of 1st degree relative <50   Diabetes                                                  Comment: family hx of   Other                                                     Comment: cardiovascular disorder family hx of   Other                                                     Comment: neurological disorder family hx of   Other                                                      Comment: respiratory disease family hx of   Social History   Marital Status: Widowed             Spouse Name:                      Years of Education:                 Number of children: 4           Occupational History Occupation          Associate Professor            Comment              retired                                   Social History Main Topics   Smoking Status: Never Smoker                     Smokeless Status: Not on file                      Alcohol Use: No             Drug Use: No             Sexual Activity: Not on file        Other Topics            Concern   None on file  Social History Narrative   None on file    ROS:see history of present illness otherwise negative   PHYSICAL EXAM: Well-nournished, in no acute distress. Neck: No JVD, HJR, Bruit, or thyroid enlargement  Lungs: No tachypnea,  clear without wheezing, rales, or rhonchi  Cardiovascular: RRR, PMI not displaced, positive S4, mid systolic click with 1/6 systolic murmur at the left sternal border and apex,no  bruit, thrill, or heave.  Abdomen: BS normal. Soft without organomegaly, masses, lesions or tenderness.  Extremities: without cyanosis, clubbing or edema. Good distal pulses bilateral  SKin: Warm, no lesions or rashes   Musculoskeletal: No deformities  Neuro: no focal signs  BP 110/70  Pulse 62  Ht 5\' 2"  (1.575 m)  Wt 176 lb (79.833 kg)  BMI 32.19 kg/m2  SpO2 97%   WUJ:WJXBJY sinus rhythm with first degree AV block, intraventricular block, poor R wave progression and nonspecific ST-T wave changes no acute change from prior tracing   Cath '03 . Left ventriculography was performed in the RAO projection.  There appeared    to be about 2+ mitral regurgitation.  Overall systolic function was    preserved.  Ejection fraction was calculated at about 63%. 2. The left main coronary artery had an upward takeoff with a 90-degree bend    just  inside the ostium.  There was perhaps a 2-mm area at the ostium prior    to the bend.  This leaves a lumen of about just over 2 mm in size and    likely accounts for the cause of the damping. 3. The LAD has about a 70% area of smooth segmental narrowing in the mid    portion of the vessel after the diagonal.  The diagonal itself has a little    bit of irregularity and about 30% involves a bifurcational stenosis right    at this location.  The distal LAD wraps the apex and is free of critical    disease. 4. The circumflex proper consists of a large marginal branch and is free of    disease. 5. The right coronary artery provides a posterior descending and    posterolateral branches.  It is free of critical disease.   CONCLUSIONS: 1. Preserved overall left ventricular systolic function. 2. ______ tapered narrowing of the left main partially due to angulation and    partially due to perhaps mild narrowing with a residual lumen of just over    2 mm. 3. A 50-70% stenosis in the mid portion of the left anterior descending    artery.

## 2012-01-29 NOTE — Patient Instructions (Addendum)
Your physician recommends that you schedule a follow-up appointment in: 1 month with Dr Daleen Squibb  Your physician recommends that you return for lab work when you have your stress echo (BMP, TSH, Lipid)  Your physician has requested that you have a stress echocardiogram. For further information please visit [email or web address] Please follow instruction sheet as given.  Your physician has recommended that you wear an event monitor. Event monitors are medical devices that record the heart's electrical activity. Doctors most often [place] these monitors to diagnose arrhythmias. Arrhythmias are problems with the speed or rhythm of the heartbeat. The monitor is a small, portable device. You can wear one while you do your normal daily activities. This is usually used to diagnose what is causing palpitations/syncope (passing out).    2 Gram Low Sodium Diet A 2 gram sodium diet restricts the amount of sodium in the diet to no more than 2 g or 2000 mg daily. Limiting the amount of sodium is often used to help lower blood pressure. It is important if you have heart, liver, or kidney problems. Many foods contain sodium for flavor and sometimes as a preservative. When the amount of sodium in a diet needs to be low, it is important to know what to look for when choosing foods and drinks. The following includes some information and guidelines to help make it easier for you to adapt to a low sodium diet. QUICK TIPS  Do not add salt to food.  Avoid convenience items and fast food.  Choose unsalted snack foods.  Buy lower sodium products, often labeled as "lower sodium" or "no salt added."  Check food labels to learn how much sodium is in 1 serving.  When eating at a restaurant, ask that your food be prepared with less salt or none, if possible. READING FOOD LABELS FOR SODIUM INFORMATION The nutrition facts label is a good place to find how much sodium is in foods. Look for products with no more than 500 to 600 mg  of sodium per meal and no more than 150 mg per serving. Remember that 2 g = 2000 mg. The food label may also list foods as:  Sodium-free: Less than 5 mg in a serving.  Very low sodium: 35 mg or less in a serving.  Low-sodium: 140 mg or less in a serving.  Light in sodium: 50% less sodium in a serving. For example, if a food that usually has 300 mg of sodium is changed to become light in sodium, it will have 150 mg of sodium.  Reduced sodium: 25% less sodium in a serving. For example, if a food that usually has 400 mg of sodium is changed to reduced sodium, it will have 300 mg of sodium. CHOOSING FOODS Grains  Avoid: Salted crackers and snack items. Some cereals, including instant hot cereals. Bread stuffing and biscuit mixes. Seasoned rice or pasta mixes.  Choose: Unsalted snack items. Low-sodium cereals, oats, puffed wheat and rice, shredded wheat. English muffins and bread. Pasta. Meats  Avoid: Salted, canned, smoked, spiced, pickled meats, including fish and poultry. Bacon, ham, sausage, cold cuts, hot dogs, anchovies.  Choose: Low-sodium canned tuna and salmon. Fresh or frozen meat, poultry, and fish. Dairy  Avoid: Processed cheese and spreads. Cottage cheese. Buttermilk and condensed milk. Regular cheese.  Choose: Milk. Low-sodium cottage cheese. Yogurt. Sour cream. Low-sodium cheese. Fruits and Vegetables  Avoid: Regular canned vegetables. Regular canned tomato sauce and paste. Frozen vegetables in sauces. Olives. Rosita Fire. Relishes. Sauerkraut.  Choose: Low-sodium canned  vegetables. Low-sodium tomato sauce and paste. Frozen or fresh vegetables. Fresh and frozen fruit. Condiments  Avoid: Canned and packaged gravies. Worcestershire sauce. Tartar sauce. Barbecue sauce. Soy sauce. Steak sauce. Ketchup. Onion, garlic, and table salt. Meat flavorings and tenderizers.  Choose: Fresh and dried herbs and spices. Low-sodium varieties of mustard and ketchup. Lemon juice. Tabasco  sauce. Horseradish. SAMPLE 2 GRAM SODIUM MEAL PLAN Breakfast / Sodium (mg)  1 cup low-fat milk / 143 mg  2 slices whole-wheat toast / 270 mg  1 tbs heart-healthy margarine / 153 mg  1 hard-boiled egg / 139 mg  1 small orange / 0 mg Lunch / Sodium (mg)  1 cup raw carrots / 76 mg   cup hummus / 298 mg  1 cup low-fat milk / 143 mg   cup red grapes / 2 mg  1 whole-wheat pita bread / 356 mg Dinner / Sodium (mg)  1 cup whole-wheat pasta / 2 mg  1 cup low-sodium tomato sauce / 73 mg  3 oz lean ground beef / 57 mg  1 small side salad (1 cup raw spinach leaves,  cup cucumber,  cup yellow bell pepper) with 1 tsp olive oil and 1 tsp red wine vinegar / 25 mg Snack / Sodium (mg)  1 container low-fat vanilla yogurt / 107 mg  3 graham cracker squares / 127 mg Nutrient Analysis  Calories: 2033  Protein: 77 g  Carbohydrate: 282 g  Fat: 72 g  Sodium: 1971 mg Document Released: 02/19/2005 Document Revised: 05/14/2011 Document Reviewed: 05/23/2009 [facility] Patient Information 2013 [place], [place].

## 2012-01-29 NOTE — Addendum Note (Signed)
Addended by: Reine Just on: 01/29/2012 01:46 PM   Modules accepted: Orders

## 2012-01-30 ENCOUNTER — Other Ambulatory Visit (INDEPENDENT_AMBULATORY_CARE_PROVIDER_SITE_OTHER): Payer: 59

## 2012-01-30 ENCOUNTER — Ambulatory Visit (HOSPITAL_COMMUNITY): Payer: Medicare PPO | Attending: Cardiovascular Disease | Admitting: Radiology

## 2012-01-30 VITALS — BP 124/50 | Ht 62.5 in | Wt 176.0 lb

## 2012-01-30 DIAGNOSIS — R0602 Shortness of breath: Secondary | ICD-10-CM

## 2012-01-30 DIAGNOSIS — R079 Chest pain, unspecified: Secondary | ICD-10-CM

## 2012-01-30 DIAGNOSIS — R0609 Other forms of dyspnea: Secondary | ICD-10-CM | POA: Insufficient documentation

## 2012-01-30 DIAGNOSIS — I447 Left bundle-branch block, unspecified: Secondary | ICD-10-CM | POA: Insufficient documentation

## 2012-01-30 DIAGNOSIS — I509 Heart failure, unspecified: Secondary | ICD-10-CM | POA: Insufficient documentation

## 2012-01-30 DIAGNOSIS — I10 Essential (primary) hypertension: Secondary | ICD-10-CM | POA: Insufficient documentation

## 2012-01-30 DIAGNOSIS — R002 Palpitations: Secondary | ICD-10-CM | POA: Insufficient documentation

## 2012-01-30 DIAGNOSIS — R0989 Other specified symptoms and signs involving the circulatory and respiratory systems: Secondary | ICD-10-CM | POA: Insufficient documentation

## 2012-01-30 DIAGNOSIS — R0789 Other chest pain: Secondary | ICD-10-CM | POA: Insufficient documentation

## 2012-01-30 DIAGNOSIS — Z8249 Family history of ischemic heart disease and other diseases of the circulatory system: Secondary | ICD-10-CM | POA: Insufficient documentation

## 2012-01-30 DIAGNOSIS — R42 Dizziness and giddiness: Secondary | ICD-10-CM | POA: Insufficient documentation

## 2012-01-30 LAB — LIPID PANEL
Cholesterol: 221 mg/dL — ABNORMAL HIGH (ref 0–200)
HDL: 39.3 mg/dL (ref >39.00)
Total CHOL/HDL Ratio: 6
Triglycerides: 177 mg/dL — ABNORMAL HIGH (ref 0.0–149.0)

## 2012-01-30 LAB — BASIC METABOLIC PANEL
CO2: 23 mEq/L (ref 19–32)
Chloride: 107 mEq/L (ref 96–112)
Creatinine, Ser: 0.8 mg/dL (ref 0.4–1.2)
Potassium: 4.1 mEq/L (ref 3.5–5.1)
Sodium: 137 mEq/L (ref 135–145)

## 2012-01-30 LAB — TSH: TSH: 1.79 u[IU]/mL (ref 0.35–5.50)

## 2012-01-30 LAB — LDL CHOLESTEROL, DIRECT: Direct LDL: 163.3 mg/dL

## 2012-01-30 MED ORDER — TECHNETIUM TC 99M SESTAMIBI GENERIC - CARDIOLITE
30.0000 | Freq: Once | INTRAVENOUS | Status: AC | PRN
  Administered 2012-01-30: 30 via INTRAVENOUS

## 2012-01-30 MED ORDER — ADENOSINE (DIAGNOSTIC) 3 MG/ML IV SOLN
0.5600 mg/kg | Freq: Once | INTRAVENOUS | Status: AC
  Administered 2012-01-30: 44.7 mg via INTRAVENOUS

## 2012-01-30 MED ORDER — TECHNETIUM TC 99M SESTAMIBI GENERIC - CARDIOLITE
10.0000 | Freq: Once | INTRAVENOUS | Status: AC | PRN
  Administered 2012-01-30: 10 via INTRAVENOUS

## 2012-01-30 NOTE — Progress Notes (Signed)
[facility] SITE 3 NUCLEAR MED [address] [id number] [place] [address] [phone]  Cardiology Nuclear Med Study  Beverly Kim is a 78 y.o. female     MRN : [id number]     DOB: 1924-10-05  Procedure Date: 01/30/2012  Nuclear Med Background Indication for Stress Test:  Evaluation for Ischemia History:  CHF, 2003, Heart Cath, N/O Dz LAD EF: 63%, 2010 MPS: NL EF: 64%, 2011 ECHO: EF; 45-50% with grade I diastolic dysfunction Cardiac Risk Factors: Family History - CAD, Hypertension, LBBB and Lipids  Symptoms:  Chest Pressure with Exertion ( chronic for years), Chest Tightness with Exertion ( chronic for years), Dizziness, DOE, Fatigue, Light-Headedness, Palpitations, SOB and "heart burn"   Nuclear Pre-Procedure Caffeine/Decaff Intake:  None NPO After: 7:00pm   Lungs:  clear O2 Sat: 97% on room air. IV 0.9% NS with Angio Cath:  22g  IV Site: L Wrist  IV Started by:  Frederick Peers, EMT-P  Chest Size (in):  38 Cup Size: C/D  Height: 5' 2.5" (1.588 m)  Weight:  176 lb (79.833 kg)  BMI:  Body mass index is 31.68 kg/(m^2). Tech Comments:  Rx this am    Nuclear Med Study 1 or 2 day study: 1 day  Stress Test Type:  Adenosine  Reading MD: Kristeen Miss, MD  Order Authorizing Provider:  M.Lenze/T.Wall MD  Resting Radionuclide: Technetium 70m Sestamibi  Resting Radionuclide Dose: 9.8 mCi   Stress Radionuclide:  Technetium 57m Sestamibi  Stress Radionuclide Dose: 31.3 mCi           Stress Protocol Rest HR: 59 Stress HR: 72  Rest BP: 124/50 Stress BP: 130/58  Exercise Time (min): n/a METS: n/a   Predicted Max HR: 135 bpm % Max HR: 53.33 bpm Rate Pressure Product: 9360   Dose of Adenosine (mg):  44.8 Dose of Lexiscan: n/a mg  Dose of Atropine (mg): n/a Dose of Dobutamine: n/a mcg/kg/min (at max HR)  Stress Test Technologist: Frederick Peers, EMT-P  Nuclear Technologist:  Doyne Keel, CNMT     Rest Procedure:  Myocardial perfusion imaging was  performed at rest 45 minutes following the intravenous administration of Technetium 54m Sestamibi Rest ECG: 1AVB/LBBB  Stress Procedure:  The patient received IV adenosine at 140 mcg/kg/min for 4 minutes.  There were no significant changes with infusion.  Technetium 43m Sestamibi was injected at the 2 minute mark and quantitative spect images were obtained after a 45 minute delay. Pt blocked with infusion at the 2/3 minute mark and recovered.  Stress ECG: Uninteretable due to baseline LBBB  QPS Raw Data Images:  Normal; no motion artifact; normal heart/lung ratio. Stress Images:  Normal homogeneous uptake in all areas of the myocardium. Rest Images:  Normal homogeneous uptake in all areas of the myocardium. Subtraction (SDS):  No evidence of ischemia. Transient Ischemic Dilatation (Normal <1.22):  1.12 Lung/Heart Ratio (Normal <0.45):  0.30  Quantitative Gated Spect Images QGS EDV:  70 ml QGS ESV:  28 ml  Impression Exercise Capacity:  Adenosine study with no exercise. BP Response:  Normal blood pressure response. Clinical Symptoms:  No significant symptoms noted. ECG Impression:  No significant ST segment change suggestive of ischemia. Comparison with Prior Nuclear Study: No images to compare  Overall Impression:  Normal stress nuclear study.  No evidence of ischemia.  Normal LV function.  LV Ejection Fraction: 60%.  LV Wall Motion:  NL LV Function; NL Wall Motion.    Alvia Grove., MD,  [facility] 01/30/2012, 5:39 PM Office - [phone] Pager [phone]

## 2012-02-05 ENCOUNTER — Encounter (INDEPENDENT_AMBULATORY_CARE_PROVIDER_SITE_OTHER): Payer: 59

## 2012-02-05 ENCOUNTER — Ambulatory Visit (HOSPITAL_COMMUNITY): Payer: Medicare PPO | Attending: Cardiovascular Disease | Admitting: Radiology

## 2012-02-05 ENCOUNTER — Other Ambulatory Visit: Payer: 59

## 2012-02-05 ENCOUNTER — Other Ambulatory Visit (HOSPITAL_COMMUNITY): Payer: 59

## 2012-02-05 DIAGNOSIS — E785 Hyperlipidemia, unspecified: Secondary | ICD-10-CM | POA: Insufficient documentation

## 2012-02-05 DIAGNOSIS — R079 Chest pain, unspecified: Secondary | ICD-10-CM | POA: Insufficient documentation

## 2012-02-05 DIAGNOSIS — R0989 Other specified symptoms and signs involving the circulatory and respiratory systems: Secondary | ICD-10-CM | POA: Insufficient documentation

## 2012-02-05 DIAGNOSIS — R002 Palpitations: Secondary | ICD-10-CM

## 2012-02-05 DIAGNOSIS — R0602 Shortness of breath: Secondary | ICD-10-CM

## 2012-02-05 DIAGNOSIS — I079 Rheumatic tricuspid valve disease, unspecified: Secondary | ICD-10-CM | POA: Insufficient documentation

## 2012-02-05 DIAGNOSIS — I059 Rheumatic mitral valve disease, unspecified: Secondary | ICD-10-CM | POA: Insufficient documentation

## 2012-02-05 DIAGNOSIS — R0609 Other forms of dyspnea: Secondary | ICD-10-CM | POA: Insufficient documentation

## 2012-02-05 DIAGNOSIS — I10 Essential (primary) hypertension: Secondary | ICD-10-CM | POA: Insufficient documentation

## 2012-02-05 DIAGNOSIS — I447 Left bundle-branch block, unspecified: Secondary | ICD-10-CM | POA: Insufficient documentation

## 2012-02-05 DIAGNOSIS — I517 Cardiomegaly: Secondary | ICD-10-CM | POA: Insufficient documentation

## 2012-02-05 NOTE — Progress Notes (Signed)
Echocardiogram performed.  

## 2012-02-06 ENCOUNTER — Other Ambulatory Visit: Payer: Self-pay

## 2012-02-06 DIAGNOSIS — E785 Hyperlipidemia, unspecified: Secondary | ICD-10-CM

## 2012-02-06 MED ORDER — PRAVASTATIN SODIUM 10 MG PO TABS: 10.0000 mg | ORAL_TABLET | Freq: Every evening | ORAL | Status: DC

## 2012-02-18 ENCOUNTER — Telehealth: Payer: Self-pay | Admitting: Cardiology

## 2012-02-18 NOTE — Telephone Encounter (Signed)
New problem:   Monitor service called patient on yesterday. Was told to call the office

## 2012-02-18 NOTE — Telephone Encounter (Signed)
Pt has e-cardio monitor. I called and spoke with tech Arlys John) at e-cardio who reports they called pt yesterday at 7:04 AM because she was having symptoms and entered that she passed out. E-cardio called pt and she had not passed out.  She was describing shaking and skipped beats. Per Arlys John monitor report at that time showed SR with one PVC.  I called pt and left message to call back

## 2012-02-19 ENCOUNTER — Telehealth: Payer: Self-pay | Admitting: Cardiology

## 2012-02-19 NOTE — Telephone Encounter (Signed)
Pt was given ecardio report from yesterday.  (see phone note for details).

## 2012-02-19 NOTE — Telephone Encounter (Signed)
Pt was notified.  

## 2012-02-19 NOTE — Telephone Encounter (Signed)
Pt rtn call from yesterday, pls call [phone]

## 2012-02-22 HISTORY — PX: TRANSTHORACIC ECHOCARDIOGRAM: SHX275

## 2012-03-03 ENCOUNTER — Telehealth: Payer: Self-pay

## 2012-03-04 ENCOUNTER — Other Ambulatory Visit: Payer: Self-pay | Admitting: Family Medicine

## 2012-03-10 ENCOUNTER — Other Ambulatory Visit: Payer: Self-pay | Admitting: *Deleted

## 2012-03-10 DIAGNOSIS — H906 Mixed conductive and sensorineural hearing loss, bilateral: Secondary | ICD-10-CM

## 2012-03-10 DIAGNOSIS — E039 Hypothyroidism, unspecified: Secondary | ICD-10-CM

## 2012-03-10 DIAGNOSIS — I10 Essential (primary) hypertension: Secondary | ICD-10-CM

## 2012-03-10 MED ORDER — LEVOTHYROXINE SODIUM 100 MCG PO TABS: 100.0000 ug | ORAL_TABLET | Freq: Every day | ORAL | Status: DC

## 2012-03-10 MED ORDER — METOPROLOL TARTRATE 50 MG PO TABS: 50.0000 mg | ORAL_TABLET | Freq: Two times a day (BID) | ORAL | Status: DC

## 2012-03-13 ENCOUNTER — Telehealth: Payer: Self-pay | Admitting: *Deleted

## 2012-03-13 NOTE — Telephone Encounter (Signed)
No indication to refer to Pulmonary. Has chronic dyspnea. Refer back to Dr Tawanna Cooler of Primary Care.

## 2012-03-13 NOTE — Telephone Encounter (Signed)
Pt given results of Monitor report per Dr. Daleen Squibb.  States she has a hard time breathing and wants to see a "lung doctor".  Will forward to Dr. Daleen Squibb for approval to refer to pulmonology.

## 2012-03-21 ENCOUNTER — Encounter: Payer: Self-pay | Admitting: Cardiology

## 2012-03-25 ENCOUNTER — Encounter: Payer: Self-pay | Admitting: Family Medicine

## 2012-03-25 ENCOUNTER — Ambulatory Visit (INDEPENDENT_AMBULATORY_CARE_PROVIDER_SITE_OTHER): Payer: Medicare Other | Admitting: Family Medicine

## 2012-03-25 VITALS — BP 130/70 | Temp 97.7°F | Wt 180.0 lb

## 2012-03-25 DIAGNOSIS — T44905A Adverse effect of unspecified drugs primarily affecting the autonomic nervous system, initial encounter: Secondary | ICD-10-CM

## 2012-03-25 DIAGNOSIS — D239 Other benign neoplasm of skin, unspecified: Secondary | ICD-10-CM

## 2012-03-25 DIAGNOSIS — R05 Cough: Secondary | ICD-10-CM

## 2012-03-25 DIAGNOSIS — T464X5A Adverse effect of angiotensin-converting-enzyme inhibitors, initial encounter: Secondary | ICD-10-CM | POA: Insufficient documentation

## 2012-03-25 DIAGNOSIS — D229 Melanocytic nevi, unspecified: Secondary | ICD-10-CM

## 2012-03-25 NOTE — Patient Instructions (Signed)
Stop the Vasotec  Followup blood pressure here in 6 weeks  Call Dr. Maryelizabeth Kaufmann for consultation concerning the lesion on your left upper eyelid

## 2012-03-25 NOTE — Progress Notes (Signed)
  Subjective:    Patient ID: Beverly Kim, female    DOB: 1924-10-24, 79 y.o.   MRN: [id number]  HPI Beverly Kim is a 79 year old female who comes in today for evaluation of 2 problems  First she says for couple years she's had a cough the won't go away. She's been on Vasotec 20 mg daily  6 she has a bump on her left upper eyelid she would like to see an ophthalmologist about. She is considering seeing Dr. Orbie Pyo.   Review of Systems Review of systems otherwise negative     Objective:   Physical Exam Well-developed well nourished female no acute distress pulmonary exam normal  Examination the upper eyelids shows a rectangular cystic lesion 2 mm x 1 mm left upper eyelid       Assessment & Plan:Cough probably secondary to ACE inhibitors,,,,,, DC ACE followup in 4 weeks  Cystic lesion left upper eyelid refer to Dr. Dagoberto Ligas ophthalmologist   ,,

## 2012-03-27 ENCOUNTER — Telehealth: Payer: Self-pay | Admitting: Cardiology

## 2012-03-27 NOTE — Telephone Encounter (Signed)
New problem:   Discuss with nurse regarding medication

## 2012-03-28 ENCOUNTER — Telehealth: Payer: Self-pay | Admitting: Family Medicine

## 2012-03-28 NOTE — Telephone Encounter (Signed)
[facility] Triage Call Report Triage Record Num: [id number] Operator: Alphonsa Overall Patient Name: Beverly Kim Call Date & Time: 03/27/2012 5:37:21PM Patient Phone: [phone] PCP: Eugenio Hoes. Todd Patient Gender: Female PCP Fax : [phone] Patient DOB: Oct 09, 1924 Practice Name: [facility] Reason for Call: [place] calling about beginning Elanapril again 03/27/12 for increase in blood pressure without medication and was having headaches. Enalapril discontinued 03/25/12. 147/90 blood pressure this am before taking medication. Dr Daleen Squibb recommended beginning medication again. Symptoms began after change in medication. Care advice given per Headache Protocol. Pt aware needs to f/u with practice in am. Protocol(s) Used: Headache Recommended Outcome per Protocol: See Provider within 24 hours Reason for Outcome: Symptoms began after starting or changing dose of prescription, nonprescription, alternative medication, or illicit drug Care Advice: ~ If diagnosed with hypertension, do not take aspirin for headache. Call EMS 911 immediately if any of the following occur: any loss of consciousness; new confusion, drowsiness or agitation; difficulty speaking; new weakness or paralysis, severe numbness, or difficulty moving. ~ ~ SYMPTOM / CONDITION MANAGEMENT Medication Advice: - Discontinue all nonprescription and alternative medications, especially stimulants, until evaluated by provider. - Take prescribed medications as directed, following label instructions for the medication. - Do not change medications or dosing regimen until provider is consulted. - Know possible side effects of medication and what to do if they occur. - Tell provider all prescription, nonprescription or alternative medications that you take ~ Call provider during regular office hours for an appointment and to get instructions regarding continuing or changing medication or changing your treatment plan. ~ 03/27/2012  5:51:08PM Page 1 of 1 [email or web address]

## 2012-04-02 NOTE — Telephone Encounter (Signed)
Spoke with pt on 03/27/12.   Spoke with pt again today. She states the pravastatin is bothering her arms, knees, and her teeth. "I don't feel like it is for me I really don't"  Pt does not feel she can tolerate the pravastatin.  She will stop the medication today.  She will call back if symptoms resolve.  Mylo Red RN

## 2012-04-08 ENCOUNTER — Ambulatory Visit: Payer: 59 | Admitting: Cardiology

## 2012-04-19 ENCOUNTER — Other Ambulatory Visit: Payer: Self-pay

## 2012-05-06 ENCOUNTER — Ambulatory Visit: Payer: Medicare Other | Admitting: Family Medicine

## 2012-05-20 ENCOUNTER — Other Ambulatory Visit: Payer: Self-pay | Admitting: Ophthalmology

## 2012-06-02 ENCOUNTER — Encounter: Payer: Self-pay | Admitting: Cardiology

## 2012-06-02 ENCOUNTER — Ambulatory Visit (INDEPENDENT_AMBULATORY_CARE_PROVIDER_SITE_OTHER): Payer: Medicare Other | Admitting: Cardiology

## 2012-06-02 VITALS — BP 124/78 | HR 69 | Ht 62.5 in | Wt 182.0 lb

## 2012-06-02 DIAGNOSIS — I2510 Atherosclerotic heart disease of native coronary artery without angina pectoris: Secondary | ICD-10-CM

## 2012-06-02 DIAGNOSIS — I10 Essential (primary) hypertension: Secondary | ICD-10-CM

## 2012-06-02 DIAGNOSIS — R05 Cough: Secondary | ICD-10-CM

## 2012-06-02 DIAGNOSIS — T44905A Adverse effect of unspecified drugs primarily affecting the autonomic nervous system, initial encounter: Secondary | ICD-10-CM

## 2012-06-02 DIAGNOSIS — I5032 Chronic diastolic (congestive) heart failure: Secondary | ICD-10-CM

## 2012-06-02 DIAGNOSIS — E785 Hyperlipidemia, unspecified: Secondary | ICD-10-CM

## 2012-06-02 DIAGNOSIS — I447 Left bundle-branch block, unspecified: Secondary | ICD-10-CM

## 2012-06-02 MED ORDER — LOSARTAN POTASSIUM 50 MG PO TABS: 50.0000 mg | ORAL_TABLET | Freq: Every day | ORAL | Status: DC

## 2012-06-02 NOTE — Patient Instructions (Addendum)
Your physician has recommended you make the following change in your medication: stop taking Enalapril and start taking Losartan 50 mg daily  Your physician wants you to follow-up in: 1 year. You will receive a reminder letter in the mail two months in advance. If you don't receive a letter, please call our office to schedule the follow-up appointment.

## 2012-06-02 NOTE — Assessment & Plan Note (Signed)
Stable. Continue good blood pressure control.

## 2012-06-02 NOTE — Progress Notes (Signed)
HPI [name] returns today for evaluation and management of her history of palpitations, coronary artery disease, chronic diastolic heart failure, left bundle branch block, hypertension. She recently had a stress Myoview which showed no ischemia with normal left ventricular function. She also had a monitor which I reviewed today which shows only normal sinus rhythm.  She continues to have numerous complaints as outlined above. She also has lots of questions.  Noted sounds cardiac. Please see recent note  from our office.  Past Medical History  Diagnosis Date  . Other left bundle branch block   . Hypertension   . Palpitations   . Mitral valve disorders   . Dyspnea   . Hyperlipidemia   . URI (upper respiratory infection)   . Low back pain   . Hypothyroidism   . Coronary artery disease   . CHF (congestive heart failure)     Current Outpatient Prescriptions  Medication Sig Dispense Refill  . Ascorbic Acid (VITAMIN C) 500 MG tablet Take 500 mg by mouth daily.        Marland Kitchen aspirin 81 MG tablet Take 81 mg by mouth daily.        . Biotin 1000 MCG tablet Take 1,000 mcg by mouth daily.        . Calcium Carbonate-Vitamin D (CALTRATE 600+D) 600-400 MG-UNIT per tablet Take 1 tablet by mouth 2 (two) times daily.        Marland Kitchen levothyroxine (SYNTHROID, LEVOTHROID) 100 MCG tablet Take 1 tablet (100 mcg total) by mouth daily.  100 tablet  3  . metoprolol (LOPRESSOR) 50 MG tablet Take 1 tablet (50 mg total) by mouth 2 (two) times daily.  200 tablet  3  . Multiple Vitamin (MULTIVITAMIN) tablet Take 1 tablet by mouth daily.        . Multiple Vitamins-Minerals (CENTRUM PO) Take by mouth daily.      Marland Kitchen losartan (COZAAR) 50 MG tablet Take 1 tablet (50 mg total) by mouth daily.  90 tablet  3   No current facility-administered medications for this visit.    Allergies  Allergen Reactions  . Adhesive (Tape) Itching, Dermatitis and Rash    Blisters and "skin bubbles"  . Atorvastatin     REACTION: Reaction not  known  . Clindamycin   . Codeine   . Latex   . Penicillins   . Simvastatin     REACTION: fatigue    Family History  Problem Relation Age of Onset  . Coronary artery disease      family hx of 1st degree relative <50  . Diabetes      family hx of  . Other      cardiovascular disorder family hx of  . Other      neurological disorder family hx of  . Other      respiratory disease family hx of  . Heart attack Daughter   . Heart Problems      all children  . Sudden death Son     History   Social History  . Marital Status: Widowed    Spouse Name: N/A    Number of Children: 4  . Years of Education: N/A   Occupational History  . retired    Social History Main Topics  . Smoking status: Never Smoker   . Smokeless tobacco: Not on file  . Alcohol Use: No  . Drug Use: No  . Sexually Active: Not on file   Other Topics Concern  . Not on file  Social History Narrative  . No narrative on file    ROS ALL NEGATIVE EXCEPT THOSE NOTED IN HPI  PE  General Appearance: well developed, well nourished in no acute distress, morbidly obese HEENT: symmetrical face, PERRLA, good dentition  Neck: no JVD, thyromegaly, or adenopathy, trachea midline Chest: symmetric without deformity Cardiac: PMI non-displaced, RRR, normal S1, S2, no gallop or murmur Lung: clear to ausculation and percussion Vascular: all pulses full without bruits  Abdominal: nondistended, nontender, good bowel sounds, no HSM, no bruits Extremities: no cyanosis, clubbing or edema, no sign of DVT, no varicosities  Skin: normal color, no rashes Neuro: alert and oriented x 3, non-focal Pysch: normal affect  EKG Not repeated  BMET    Component Value Date/Time   NA 137 01/30/2012 1016   K 4.1 01/30/2012 1016   CL 107 01/30/2012 1016   CO2 23 01/30/2012 1016   GLUCOSE 122* 01/30/2012 1016   BUN 28* 01/30/2012 1016   CREATININE 0.8 01/30/2012 1016   CALCIUM 9.2 01/30/2012 1016   GFRNONAA 63.68 12/13/2008  1108   GFRAA 89 08/12/2007 1110    Lipid Panel     Component Value Date/Time   CHOL 221* 01/30/2012 1016   TRIG 177.0* 01/30/2012 1016   HDL 39.30 01/30/2012 1016   CHOLHDL 6 01/30/2012 1016   VLDL 35.4 01/30/2012 1016   LDLCALC 122* 07/30/2006 0858    CBC    Component Value Date/Time   WBC 6.7 12/25/2010 1211   RBC 4.15 12/25/2010 1211   HGB 13.8 12/25/2010 1211   HCT 40.4 12/25/2010 1211   PLT 242.0 12/25/2010 1211   MCV 97.3 12/25/2010 1211   MCHC 34.1 12/25/2010 1211   RDW 14.1 12/25/2010 1211   LYMPHSABS 2.1 12/25/2010 1211   MONOABS 0.5 12/25/2010 1211   EOSABS 0.1 12/25/2010 1211   BASOSABS 0.0 12/25/2010 1211

## 2012-06-02 NOTE — Assessment & Plan Note (Signed)
Stable. Continue secondary preventative therapy. 

## 2012-06-02 NOTE — Assessment & Plan Note (Signed)
I have switched her to losartan 50 mg a day. She'll monitor her blood pressures. She will stop her enalapril.

## 2012-11-06 ENCOUNTER — Encounter: Payer: Self-pay | Admitting: Cardiology

## 2012-11-06 ENCOUNTER — Telehealth: Payer: Self-pay | Admitting: Cardiology

## 2012-11-06 ENCOUNTER — Ambulatory Visit (INDEPENDENT_AMBULATORY_CARE_PROVIDER_SITE_OTHER): Payer: Medicare Other | Admitting: Cardiology

## 2012-11-06 VITALS — BP 160/72 | HR 73 | Ht 64.0 in | Wt 186.2 lb

## 2012-11-06 DIAGNOSIS — R609 Edema, unspecified: Secondary | ICD-10-CM

## 2012-11-06 DIAGNOSIS — R0602 Shortness of breath: Secondary | ICD-10-CM

## 2012-11-06 MED ORDER — FUROSEMIDE 20 MG PO TABS: 20.0000 mg | ORAL_TABLET | Freq: Every day | ORAL | Status: DC

## 2012-11-06 NOTE — Telephone Encounter (Signed)
I spoke with the pt and a few weeks ago she felt weak and decided to stop her losartan at that time.  Since then the pt has developed SOB and bilateral lower extremity edema. The pt said she has seen her PCP and had her thyroid medication adjusted recently. The pt does not weigh herself on a daily basis. I have arranged for the pt to come into the office today for further evaluation with Dr Antoine Poche. The pt is planning to go to [facility] on Monday.

## 2012-11-06 NOTE — Progress Notes (Signed)
HPI The patient presents for evaluation of dyspnea. She was previously seen by Dr. Daleen Squibb.  She has a past history of nonobstructive coronary disease with a 70% LAD stenosis in 2003. I reviewed previous records. When she was seen last fall she had an echo which was normal LV function but mild mitral regurgitation. In November she had a stress perfusion study with no evidence of ischemia or infarct. She's had management of her blood pressure and was on losartan. However, after being on this she developed leg weakness. This was subsequently discontinued. She's now on the medications as listed and has noted that she's had some feet swelling. This really happened she thinks only a few days ago. She's had some increasing shortness of breath which she notices with activities or even at rest. She has not describing classic PND or orthopnea. She's not noticing any new palpitations, presyncope or syncope.  Allergies  Allergen Reactions  . Adhesive [Tape] Itching, Dermatitis and Rash    Blisters and "skin bubbles"  . Atorvastatin     REACTION: Reaction not known  . Clindamycin   . Codeine   . Latex   . Penicillins   . Simvastatin     REACTION: fatigue    Current Outpatient Prescriptions  Medication Sig Dispense Refill  . levothyroxine (SYNTHROID, LEVOTHROID) 100 MCG tablet Take 1 tablet (100 mcg total) by mouth daily.  100 tablet  3  . metoprolol (LOPRESSOR) 50 MG tablet Take 1 tablet (50 mg total) by mouth 2 (two) times daily.  200 tablet  3   No current facility-administered medications for this visit.    Past Medical History  Diagnosis Date  . Other left bundle branch block   . Hypertension   . Palpitations   . Mitral valve disorders   . Dyspnea   . Hyperlipidemia   . URI (upper respiratory infection)   . Low back pain   . Hypothyroidism   . Coronary artery disease   . CHF (congestive heart failure)     Past Surgical History  Procedure Laterality Date  . Colonoscopy    . Cb      . Cb    . Total abdominal hysterectomy w/ bilateral salpingoophorectomy    . Tonsillectomy and adenoidectomy    . Thyroidectomy    . Cardiac catheterization  04-18-01  . Hernia repair      ROS: PHYSICAL EXAM BP 160/72  Pulse 73  Ht 5\' 4"  (1.626 m)  Wt 186 lb 3.2 oz (84.46 kg)  BMI 31.95 kg/m2 GENERAL:  Well appearing HEENT:  Pupils equal round and reactive, fundi not visualized, oral mucosa unremarkable NECK:  No jugular venous distention, waveform within normal limits, carotid upstroke brisk and symmetric, no bruits, no thyromegaly LYMPHATICS:  No cervical, inguinal adenopathy LUNGS:  Clear to auscultation bilaterally BACK:  No CVA tenderness CHEST:  Unremarkable HEART:  PMI not displaced or sustained,S1 and S2 within normal limits, no S3, no S4, no clicks, no rubs, no murmurs ABD:  Flat, positive bowel sounds normal in frequency in pitch, no bruits, no rebound, no guarding, no midline pulsatile mass, no hepatomegaly, no splenomegaly EXT:  2 plus pulses throughout, mild edema, no cyanosis no clubbing SKIN:  No rashes no nodules NEURO:  Cranial nerves II through XII grossly intact, motor grossly intact throughout PSYCH:  Cognitively intact, oriented to person place and time   EKG:  Normal sinus rhythm, left bundle branch block, left axis deviation  ASSESSMENT AND PLAN  DYSPNEA:  There certainly could be some element of diastolic dysfunction. She has a lower extremity swelling as well. I am going to give her a low dose of diuretic. We will see if this helps symptomatically. I will check a basic metabolic profile in about 10 days.  EDEMA:  As above.  CHEST PAIN:  Given the negative stress perfusion study last winter do not think further cardiovascular testing is indicated. Her pain is atypical.

## 2012-11-06 NOTE — Patient Instructions (Addendum)
START LASIX (FUROSEMIDE) 20 MG DAILY, RX SENT TO PHARMACY  INCREASE YOUR FOODS HIGH IN POTASSIUM  Your physician recommends that you schedule a follow-up appointment in: 6 WEEKS  YOU NEED A BMET IN 10 DAYS, OK TO GET AT YOUR PRIMARY CARE PHYSICIAN

## 2012-11-06 NOTE — Telephone Encounter (Signed)
Pt of walls having SOB, wants appt tomorrow, pls advise [phone]

## 2012-11-19 ENCOUNTER — Telehealth: Payer: Self-pay | Admitting: Cardiology

## 2012-11-19 ENCOUNTER — Other Ambulatory Visit: Payer: Self-pay | Admitting: *Deleted

## 2012-11-19 DIAGNOSIS — R0600 Dyspnea, unspecified: Secondary | ICD-10-CM

## 2012-11-19 DIAGNOSIS — Z79899 Other long term (current) drug therapy: Secondary | ICD-10-CM

## 2012-11-19 NOTE — Telephone Encounter (Signed)
Called stating she wants to come get lab work here instead of PCP.  Scheduled her for [facility] for Friday 9/19 at 11:00.

## 2012-11-19 NOTE — Telephone Encounter (Signed)
Patient wants to know if we can put lab orders in for her to have them drawn here.  She has an appt here in October with [facility]

## 2012-11-21 ENCOUNTER — Other Ambulatory Visit: Payer: Medicare Other

## 2012-11-21 ENCOUNTER — Other Ambulatory Visit (INDEPENDENT_AMBULATORY_CARE_PROVIDER_SITE_OTHER): Payer: Medicare Other

## 2012-11-21 ENCOUNTER — Telehealth: Payer: Self-pay | Admitting: Cardiology

## 2012-11-21 DIAGNOSIS — R0989 Other specified symptoms and signs involving the circulatory and respiratory systems: Secondary | ICD-10-CM

## 2012-11-21 DIAGNOSIS — Z79899 Other long term (current) drug therapy: Secondary | ICD-10-CM

## 2012-11-21 DIAGNOSIS — R0600 Dyspnea, unspecified: Secondary | ICD-10-CM

## 2012-11-21 DIAGNOSIS — R0609 Other forms of dyspnea: Secondary | ICD-10-CM

## 2012-11-21 LAB — BASIC METABOLIC PANEL
BUN: 15 mg/dL (ref 6–23)
CO2: 26 mEq/L (ref 19–32)
Chloride: 104 mEq/L (ref 96–112)
Creatinine, Ser: 0.8 mg/dL (ref 0.4–1.2)
Potassium: 4.1 mEq/L (ref 3.5–5.1)

## 2012-11-21 NOTE — Telephone Encounter (Signed)
Left message for pt - I would not know if she needs an inhaler or not.  Requested she call back to discuss what benefit she has had from the diuretic.  Also suggested she call for PCP to see if she needs to see a pulmonologist.

## 2012-11-21 NOTE — Telephone Encounter (Signed)
Having problems with SOB.  Need to know if I will need a inhaler call Proventral.   When walking from house to mail box I get out of breath.

## 2012-11-21 NOTE — Telephone Encounter (Signed)
Spoke with pt who states she hasn't seen much if any improvement in her SOB since being on the diuretic.  Advised to call her PCP to follow up

## 2012-11-25 ENCOUNTER — Telehealth: Payer: Self-pay | Admitting: Cardiology

## 2012-11-25 NOTE — Telephone Encounter (Signed)
Follow up:  Pt states she is returning Pam's call.

## 2012-11-25 NOTE — Telephone Encounter (Signed)
Pt aware of results 

## 2012-12-16 ENCOUNTER — Ambulatory Visit (INDEPENDENT_AMBULATORY_CARE_PROVIDER_SITE_OTHER): Payer: Medicare Other | Admitting: Family Medicine

## 2012-12-16 ENCOUNTER — Encounter: Payer: Self-pay | Admitting: Family Medicine

## 2012-12-16 VITALS — BP 130/78 | HR 80 | Temp 97.9°F | Wt 184.0 lb

## 2012-12-16 DIAGNOSIS — Z23 Encounter for immunization: Secondary | ICD-10-CM

## 2012-12-16 DIAGNOSIS — I5032 Chronic diastolic (congestive) heart failure: Secondary | ICD-10-CM

## 2012-12-16 DIAGNOSIS — I10 Essential (primary) hypertension: Secondary | ICD-10-CM

## 2012-12-16 NOTE — Patient Instructions (Signed)
Continue your current medications  1 she see Dr. Leta Jungling H. a week from now he and I will discuss further evaluation

## 2012-12-16 NOTE — Progress Notes (Signed)
  Subjective:    Patient ID: Beverly Kim, female    DOB: 30-Jan-1925, 79 y.o.   MRN: [id number]  HPI  Beverly Kim is a 79 year old female who comes in today for evaluation of shortness of breath  Her last cardiac evaluation with Dr. Nickie Retort. on September 5 was normal. He felt like she had some mild diastolic dysfunction and start on Lasix 20 mg daily. She states she's been taking a medication but doesn't feel any better. She denies any chest pain. It's extremely difficult to pinpoint her symptoms. She does not wake up at night short of breath the shortness of breath she states has been going on for years it is not any better nor he worse.  She does have sleep dysfunction. She states she goes to bed at 9:00 sleeps for 4 hours and is awake the rest at night entangle back to sleep. Her caffeine consumption is minimal. I wonder if it could be an element of depression and.  She states Dr. Leta Jungling H. set her to see me to see if she needed see a pulmonologist for further evaluation of her shortness of breath???????????????????  Review of Systems Review of systems otherwise negative    Objective:   Physical Exam Well-developed well-nourished female no acute distress and 47 there is no JVD lungs are clear cardiac exam I can appreciate no murmur although she has a history of MVP. Extremities show trace edema bilaterally  I reviewed all her nodes from Dr. Nickie Retort. and lab work.       Assessment & Plan:  The shortness of breath............. it does not appear to be cardiac. She does have an element of diastolic dysfunction which should be well treated with the Lasix 20 mg daily. She still is fixated on her symptoms. I would recommend we go ahead and send her for pulmonary evaluation if that's okay with Dr. Nickie Retort.  With the sleep dysfunction also must consider an element of depression. Therefore for pulmonary evaluation is negative I would consider starting on an antidepressant

## 2012-12-23 ENCOUNTER — Encounter: Payer: Self-pay | Admitting: Cardiology

## 2012-12-23 ENCOUNTER — Ambulatory Visit (INDEPENDENT_AMBULATORY_CARE_PROVIDER_SITE_OTHER): Payer: Medicare Other | Admitting: Cardiology

## 2012-12-23 VITALS — BP 120/60 | HR 71 | Ht 64.0 in | Wt 183.8 lb

## 2012-12-23 DIAGNOSIS — R0602 Shortness of breath: Secondary | ICD-10-CM

## 2012-12-23 NOTE — Patient Instructions (Signed)
The current medical regimen is effective;  continue present plan and medications.  A chest x-ray takes a picture of the organs and structures inside the chest, including the heart, lungs, and blood vessels. This test can show several things, including, whether the heart is enlarges; whether fluid is building up in the lungs; and whether pacemaker / defibrillator leads are still in place.  Your physician has recommended that you have a pulmonary function test. Pulmonary Function Tests are a group of tests that measure how well air moves in and out of your lungs.  Follow up in one month with Dr Antoine Poche.

## 2012-12-23 NOTE — Progress Notes (Signed)
HPI The patient presents for evaluation of dyspnea. She was previously seen by Dr. Daleen Squibb.  She has a past history of nonobstructive coronary disease with a 70% LAD stenosis in 2003. I reviewed previous records. When she was seen last fall she had an echo which was normal LV function but mild mitral regurgitation. In November of last year she had a stress perfusion study with no evidence of ischemia or infarct. She's had management of her blood pressure and was on losartan. However, after being on this she developed leg weakness. This was subsequently discontinued.   Most recently I treated her with a low dose of diuretic. There may have been some slight improvement in her dyspnea. However, she still having this complaint as described above. She has less swelling in her feet and she did previously.notices with activities or even at rest. She has not describing classic PND or orthopnea. She's not noticing any new palpitations, presyncope or syncope.   She says she is quite limited in her activities. Of note he did walk her around the office today and she was dyspneic but had normal saturations.  Allergies  Allergen Reactions  . Adhesive [Tape] Itching, Dermatitis and Rash    Blisters and "skin bubbles"  . Atorvastatin     REACTION: Reaction not known  . Clindamycin   . Codeine   . Latex   . Penicillins   . Simvastatin     REACTION: fatigue    Current Outpatient Prescriptions  Medication Sig Dispense Refill  . aspirin 81 MG tablet Take 81 mg by mouth daily.      Marland Kitchen BIOTIN PO Take 500 mg by mouth daily.      . furosemide (LASIX) 20 MG tablet Take 1 tablet (20 mg total) by mouth daily.  90 tablet  3  . levothyroxine (SYNTHROID, LEVOTHROID) 100 MCG tablet Take 1 tablet (100 mcg total) by mouth daily.  100 tablet  3  . metoprolol (LOPRESSOR) 50 MG tablet Take 1 tablet (50 mg total) by mouth 2 (two) times daily.  200 tablet  3   No current facility-administered medications for this visit.     Past Medical History  Diagnosis Date  . Other left bundle branch block   . Hypertension   . Palpitations   . Mitral valve disorders   . Dyspnea   . Hyperlipidemia   . URI (upper respiratory infection)   . Low back pain   . Hypothyroidism   . Coronary artery disease   . CHF (congestive heart failure)     Past Surgical History  Procedure Laterality Date  . Colonoscopy    . Cb    . Cb    . Total abdominal hysterectomy w/ bilateral salpingoophorectomy    . Tonsillectomy and adenoidectomy    . Thyroidectomy    . Cardiac catheterization  04-18-01  . Hernia repair      ROS:  As stated in the HPI and negative for all other systems.  PHYSICAL EXAM BP 120/60  Pulse 71  Ht 5\' 4"  (1.626 m)  Wt 183 lb 12.8 oz (83.371 kg)  BMI 31.53 kg/m2  SpO2 95% GENERAL:  Well appearing HEENT:  Pupils equal round and reactive, fundi not visualized, oral mucosa unremarkable NECK:  No jugular venous distention, waveform within normal limits, carotid upstroke brisk and symmetric, no bruits, no thyromegaly LYMPHATICS:  No cervical, inguinal adenopathy LUNGS:  Clear to auscultation bilaterally BACK:  No CVA tenderness CHEST:  Unremarkable HEART:  PMI not  displaced or sustained,S1 and S2 within normal limits, no S3, no S4, no clicks, no rubs, no murmurs ABD:  Flat, positive bowel sounds normal in frequency in pitch, no bruits, no rebound, no guarding, no midline pulsatile mass, no hepatomegaly, no splenomegaly EXT:  2 plus pulses throughout, trace edema, no cyanosis no clubbing    ASSESSMENT AND PLAN  DYSPNEA:  There certainly could be some element of diastolic dysfunction.  However, LC another cardiac etiology. Some of this may be functional. I am goingto start with a chest x-ray and pulmonary function testing and possibly a pulmonary consult.  EDEMA:  As above.  CHEST PAIN:  Given the negative stress perfusion study last winter do not think further cardiovascular testing is indicated. Her  pain is atypical.

## 2013-01-12 ENCOUNTER — Ambulatory Visit (INDEPENDENT_AMBULATORY_CARE_PROVIDER_SITE_OTHER)
Admission: RE | Admit: 2013-01-12 | Discharge: 2013-01-12 | Disposition: A | Discharge status: Home / Self Care | Payer: Medicare Other | Source: Ambulatory Visit | Attending: Cardiology | Admitting: Cardiology

## 2013-01-12 ENCOUNTER — Ambulatory Visit (INDEPENDENT_AMBULATORY_CARE_PROVIDER_SITE_OTHER): Payer: Medicare Other | Admitting: Internal Medicine

## 2013-01-12 DIAGNOSIS — R0602 Shortness of breath: Secondary | ICD-10-CM

## 2013-01-12 LAB — PULMONARY FUNCTION TEST

## 2013-01-12 NOTE — Progress Notes (Signed)
PFT done today. 

## 2013-01-22 ENCOUNTER — Encounter: Payer: Self-pay | Admitting: Cardiology

## 2013-01-26 ENCOUNTER — Ambulatory Visit: Payer: Medicare Other | Admitting: Cardiology

## 2013-02-03 ENCOUNTER — Ambulatory Visit (INDEPENDENT_AMBULATORY_CARE_PROVIDER_SITE_OTHER): Payer: Medicare Other | Admitting: Cardiology

## 2013-02-03 ENCOUNTER — Encounter: Payer: Self-pay | Admitting: Cardiology

## 2013-02-03 VITALS — BP 139/70 | HR 74 | Ht 62.0 in | Wt 181.0 lb

## 2013-02-03 DIAGNOSIS — R0609 Other forms of dyspnea: Secondary | ICD-10-CM

## 2013-02-03 DIAGNOSIS — R0600 Dyspnea, unspecified: Secondary | ICD-10-CM | POA: Insufficient documentation

## 2013-02-03 DIAGNOSIS — I2510 Atherosclerotic heart disease of native coronary artery without angina pectoris: Secondary | ICD-10-CM

## 2013-02-03 DIAGNOSIS — I5032 Chronic diastolic (congestive) heart failure: Secondary | ICD-10-CM

## 2013-02-03 MED ORDER — OMEPRAZOLE 20 MG PO CPDR: 20.0000 mg | DELAYED_RELEASE_CAPSULE | Freq: Every day | ORAL | Status: DC

## 2013-02-03 NOTE — Progress Notes (Signed)
HPI The patient presents for evaluation of dyspnea.  She has a past history of nonobstructive coronary disease with a 70% LAD stenosis in 2003. Last fall she had an echo which was normal LV function but mild mitral regurgitation. In November of last year she had a stress perfusion study with no evidence of ischemia or infarct. She has had some suggestion of diastolic dysfunction. She might of had some improvement in her breathing with diuretics. I sent her at the last visit for pulmonary function tests which demonstrated normal lung volumes and only a mildly reduced diffusion capacity. Chest x-ray was unremarkable. However, she says she still much more short of breath than she thinks she should be. She denies any chest pressure, neck or arm discomfort. She's not describing any new palpitations, presyncope or syncope. She does report a hiatal hernia.  She reportsa sensation of not being able to get her breath in   Allergies  Allergen Reactions  . Adhesive [Tape] Itching, Dermatitis and Rash    Blisters and "skin bubbles"  . Atorvastatin     REACTION: Reaction not known  . Clindamycin   . Codeine   . Latex   . Penicillins   . Simvastatin     REACTION: fatigue    Current Outpatient Prescriptions  Medication Sig Dispense Refill  . aspirin 81 MG tablet Take 81 mg by mouth daily.      Marland Kitchen BIOTIN PO Take 500 mg by mouth daily.      . furosemide (LASIX) 20 MG tablet Take 1 tablet (20 mg total) by mouth daily.  90 tablet  3  . levothyroxine (SYNTHROID, LEVOTHROID) 100 MCG tablet Take 1 tablet (100 mcg total) by mouth daily.  100 tablet  3  . metoprolol (LOPRESSOR) 50 MG tablet Take 1 tablet (50 mg total) by mouth 2 (two) times daily.  200 tablet  3  . Multiple Vitamins-Minerals (MULTIVITAMIN PO) Take by mouth daily.      . vitamin C (ASCORBIC ACID) 500 MG tablet Take 500 mg by mouth daily.       No current facility-administered medications for this visit.    Past Medical History  Diagnosis  Date  . Other left bundle branch block   . Hypertension   . Palpitations   . Mitral valve disorders   . Dyspnea   . Hyperlipidemia   . URI (upper respiratory infection)   . Low back pain   . Hypothyroidism   . Coronary artery disease   . CHF (congestive heart failure)     Past Surgical History  Procedure Laterality Date  . Colonoscopy    . Cb    . Cb    . Total abdominal hysterectomy w/ bilateral salpingoophorectomy    . Tonsillectomy and adenoidectomy    . Thyroidectomy    . Cardiac catheterization  04-18-01  . Hernia repair      ROS:  As stated in the HPI and negative for all other systems.  PHYSICAL EXAM BP 139/70  Pulse 74  Ht 5\' 2"  (1.575 m)  Wt 181 lb (82.101 kg)  BMI 33.10 kg/m2 GENERAL:  Well appearing NECK:  No jugular venous distention, waveform within normal limits, carotid upstroke brisk and symmetric, no bruits, no thyromegaly LUNGS:  Clear to auscultation bilaterally BACK:  No CVA tenderness CHEST:  Unremarkable HEART:  PMI not displaced or sustained,S1 and S2 within normal limits, no S3, no S4, no clicks, no rubs, no murmurs ABD:  Flat, positive bowel sounds  normal in frequency in pitch, no bruits, no rebound, no guarding, no midline pulsatile mass, no hepatomegaly, no splenomegaly EXT:  2 plus pulses throughout, trace edema, no cyanosis no clubbing   ASSESSMENT AND PLAN   DYSPNEA:  I did not see a cardiac etiology. However, I think there is still a small possibility that he could be related to her previous diagnosis of nonobstructive coronary disease. I discussed with her the possibility of getting more data with a right and left heart catheterization. She would like to defer doing this. On the outside chance that this is related to reflux I will go and give her a trial prescription of Prilosec. However, she will otherwise followup with Dr. Tawanna Cooler and get back to me in the future if she wants to pursue further invasive cardiac testing.  EDEMA:  She will  continue the meds as above.

## 2013-02-03 NOTE — Patient Instructions (Addendum)
Please take Prilosec 20 mg a day. Continue all other medications as listed.  Follow up in 4 months with Dr Antoine Poche.

## 2013-06-15 ENCOUNTER — Ambulatory Visit: Payer: Medicare Other | Admitting: Cardiology

## 2013-07-03 ENCOUNTER — Other Ambulatory Visit: Payer: Self-pay | Admitting: Family Medicine

## 2013-07-17 ENCOUNTER — Ambulatory Visit (INDEPENDENT_AMBULATORY_CARE_PROVIDER_SITE_OTHER): Payer: Commercial Managed Care - HMO | Admitting: Cardiology

## 2013-07-17 ENCOUNTER — Encounter: Payer: Self-pay | Admitting: Cardiology

## 2013-07-17 VITALS — BP 150/70 | HR 68 | Ht 62.5 in | Wt 184.8 lb

## 2013-07-17 DIAGNOSIS — R0600 Dyspnea, unspecified: Secondary | ICD-10-CM

## 2013-07-17 DIAGNOSIS — R0989 Other specified symptoms and signs involving the circulatory and respiratory systems: Secondary | ICD-10-CM

## 2013-07-17 DIAGNOSIS — R0609 Other forms of dyspnea: Secondary | ICD-10-CM

## 2013-07-17 DIAGNOSIS — I2510 Atherosclerotic heart disease of native coronary artery without angina pectoris: Secondary | ICD-10-CM

## 2013-07-17 NOTE — Patient Instructions (Addendum)
The current medical regimen is effective;  continue present plan and medications.  Follow up in 6 months with Dr Hochrein.  You will receive a letter in the mail 2 months before you are due.  Please call us when you receive this letter to schedule your follow up appointment.  

## 2013-07-17 NOTE — Progress Notes (Signed)
HPI The patient presents for evaluation of dyspnea.  She has a past history of nonobstructive coronary disease with a 70% LAD stenosis in 2003. Last fall she had an echo which was normal LV function but mild mitral regurgitation. In November of 2013 she had a stress perfusion study with no evidence of ischemia or infarct. She has had some suggestion of diastolic dysfunction. She might of had some improvement in her breathing with diuretics. She has had pulmonary function tests which demonstrated normal lung volumes and only a mildly reduced diffusion capacity. Chest x-ray was unremarkable. I have suggested that she take a trial of Prilosec but she didn't want to do this because she was afraid of potential side effects. We have discussed possible right and left heart catheterization she would prefer to defer.  She reports that her symptoms are worse.  She is dyspneic particularly after eating and walking.  She is not describing PND or orthopnea.  She has very mild ankle edema unchanged.  In the office we walked her around and she felt SOB.  However, her sats stayed at 93 -94%.     Allergies  Allergen Reactions  . Adhesive [Tape] Itching, Dermatitis and Rash    Blisters and "skin bubbles"  . Atorvastatin     REACTION: Reaction not known  . Clindamycin   . Codeine   . Latex   . Penicillins   . Simvastatin     REACTION: fatigue    Current Outpatient Prescriptions  Medication Sig Dispense Refill  . aspirin 81 MG tablet Take 81 mg by mouth daily.      Marland Kitchen BIOTIN PO Take 500 mg by mouth daily.      . furosemide (LASIX) 20 MG tablet Take 1 tablet (20 mg total) by mouth daily.  90 tablet  3  . metoprolol (LOPRESSOR) 50 MG tablet TAKE ONE TABLET BY MOUTH TWICE DAILY  200 tablet  0  . Multiple Vitamins-Minerals (MULTIVITAMIN PO) Take by mouth daily.      Marland Kitchen SYNTHROID 100 MCG tablet TAKE ONE TABLET BY MOUTH EVERY DAY  100 tablet  0  . vitamin C (ASCORBIC ACID) 500 MG tablet Take 500 mg by mouth  daily.       No current facility-administered medications for this visit.    Past Medical History  Diagnosis Date  . Other left bundle branch block   . Hypertension   . Palpitations   . Mitral valve disorders   . Dyspnea   . Hyperlipidemia   . URI (upper respiratory infection)   . Low back pain   . Hypothyroidism   . Coronary artery disease   . CHF (congestive heart failure)     Past Surgical History  Procedure Laterality Date  . Colonoscopy    . Cb    . Cb    . Total abdominal hysterectomy w/ bilateral salpingoophorectomy    . Tonsillectomy and adenoidectomy    . Thyroidectomy    . Cardiac catheterization  04-18-01  . Hernia repair      ROS:  As stated in the HPI and negative for all other systems.  PHYSICAL EXAM BP 150/70  Pulse 68  Ht 5' 2.5" (1.588 m)  Wt 184 lb 12.8 oz (83.825 kg)  BMI 33.24 kg/m2  SpO2 95% GENERAL:  Well appearing NECK:  No jugular venous distention, waveform within normal limits, carotid upstroke brisk and symmetric, no bruits, no thyromegaly LUNGS:  Clear to auscultation bilaterally BACK:  No CVA tenderness  CHEST:  Unremarkable HEART:  PMI not displaced or sustained,S1 and S2 within normal limits, no S3, no S4, no clicks, no rubs, no murmurs ABD:  Flat, positive bowel sounds normal in frequency in pitch, no bruits, no rebound, no guarding, no midline pulsatile mass, no hepatomegaly, no splenomegaly EXT:  2 plus pulses throughout, trace edema, no cyanosis no clubbing  EKG:  Sinus rhythm, rate 68, left bundle branch block, left axis deviation .  No change.  07/17/2013  ASSESSMENT AND PLAN   DYSPNEA:  I did not see a cardiac etiology. However, I think there is still a small possibility that he could be related to her previous diagnosis of nonobstructive coronary disease. I discussed with her the possibility of getting more data with a right and left heart catheterization again,  . She would still like to defer doing this. On the outside chance  that this is related to reflux she now agrees to try Prilosec. However, she will otherwise followup with Dr. Sherren Mocha and get back to me in the future if she wants to pursue further invasive cardiac testing.  EDEMA:  She will continue the meds as above.

## 2013-10-16 ENCOUNTER — Other Ambulatory Visit: Payer: Self-pay | Admitting: Family Medicine

## 2013-10-16 DIAGNOSIS — R0602 Shortness of breath: Secondary | ICD-10-CM

## 2013-10-19 ENCOUNTER — Encounter: Payer: Self-pay | Admitting: *Deleted

## 2013-10-19 ENCOUNTER — Other Ambulatory Visit: Payer: Self-pay | Admitting: *Deleted

## 2013-10-19 ENCOUNTER — Telehealth: Payer: Self-pay | Admitting: Family Medicine

## 2013-10-19 DIAGNOSIS — R609 Edema, unspecified: Secondary | ICD-10-CM

## 2013-10-19 DIAGNOSIS — R0602 Shortness of breath: Secondary | ICD-10-CM

## 2013-10-19 MED ORDER — SYNTHROID 100 MCG PO TABS: ORAL_TABLET | ORAL | Status: DC

## 2013-10-19 MED ORDER — FUROSEMIDE 20 MG PO TABS: 20.0000 mg | ORAL_TABLET | Freq: Every day | ORAL | Status: DC

## 2013-10-19 NOTE — Telephone Encounter (Signed)
Rx sent 

## 2013-10-19 NOTE — Telephone Encounter (Signed)
Pt is needing new rx SYNTHROID 100 MCG tablet pt is completely out, send to [facility] on [address].

## 2013-10-27 ENCOUNTER — Encounter: Payer: Self-pay | Admitting: Internal Medicine

## 2013-10-27 ENCOUNTER — Ambulatory Visit (INDEPENDENT_AMBULATORY_CARE_PROVIDER_SITE_OTHER): Payer: Commercial Managed Care - HMO | Admitting: Internal Medicine

## 2013-10-27 ENCOUNTER — Other Ambulatory Visit (INDEPENDENT_AMBULATORY_CARE_PROVIDER_SITE_OTHER): Payer: Commercial Managed Care - HMO

## 2013-10-27 VITALS — BP 104/72 | HR 70 | Temp 97.9°F | Ht 62.5 in | Wt 184.4 lb

## 2013-10-27 DIAGNOSIS — R0600 Dyspnea, unspecified: Secondary | ICD-10-CM

## 2013-10-27 DIAGNOSIS — R058 Other specified cough: Secondary | ICD-10-CM

## 2013-10-27 DIAGNOSIS — R0989 Other specified symptoms and signs involving the circulatory and respiratory systems: Secondary | ICD-10-CM

## 2013-10-27 DIAGNOSIS — R0609 Other forms of dyspnea: Secondary | ICD-10-CM

## 2013-10-27 DIAGNOSIS — R059 Cough, unspecified: Secondary | ICD-10-CM

## 2013-10-27 DIAGNOSIS — R05 Cough: Secondary | ICD-10-CM

## 2013-10-27 LAB — BASIC METABOLIC PANEL
BUN: 19 mg/dL (ref 6–23)
CO2: 26 meq/L (ref 19–32)
CREATININE: 1 mg/dL (ref 0.4–1.2)
Calcium: 9.8 mg/dL (ref 8.4–10.5)
Chloride: 101 mEq/L (ref 96–112)
GFR: 59.13 mL/min — ABNORMAL LOW (ref >60.00)
Glucose, Bld: 95 mg/dL (ref 70–99)
Potassium: 4.7 mEq/L (ref 3.5–5.1)
Sodium: 138 mEq/L (ref 135–145)

## 2013-10-27 LAB — CBC WITH DIFFERENTIAL/PLATELET
BASOS ABS: 0 10*3/uL (ref 0.0–0.1)
Basophils Relative: 0.5 % (ref 0.0–3.0)
EOS PCT: 2.1 % (ref 0.0–5.0)
Eosinophils Absolute: 0.2 10*3/uL (ref 0.0–0.7)
HCT: 42.6 % (ref 36.0–46.0)
Hemoglobin: 14.4 g/dL (ref 12.0–15.0)
Lymphocytes Relative: 34.6 % (ref 12.0–46.0)
Lymphs Abs: 2.9 10*3/uL (ref 0.7–4.0)
MCHC: 33.8 g/dL (ref 30.0–36.0)
MCV: 94.3 fl (ref 78.0–100.0)
MONOS PCT: 9.8 % (ref 3.0–12.0)
Monocytes Absolute: 0.8 10*3/uL (ref 0.1–1.0)
NEUTROS ABS: 4.5 10*3/uL (ref 1.4–7.7)
Neutrophils Relative %: 53 % (ref 43.0–77.0)
Platelets: 254 10*3/uL (ref 150.0–400.0)
RBC: 4.52 Mil/uL (ref 3.87–5.11)
RDW: 14.5 % (ref 11.5–15.5)
WBC: 8.4 10*3/uL (ref 4.0–10.5)

## 2013-10-27 LAB — TSH: TSH: 1.04 u[IU]/mL (ref 0.35–4.50)

## 2013-10-27 LAB — BRAIN NATRIURETIC PEPTIDE: PRO B NATRI PEPTIDE: 94 pg/mL (ref 0.0–100.0)

## 2013-10-27 NOTE — Assessment & Plan Note (Addendum)
The most common causes of chronic cough in immunocompetent adults include the following: upper airway cough syndrome (UACS), previously referred to as postnasal drip syndrome (PNDS), which is caused by variety of rhinosinus conditions; (2) asthma; (3) GERD; (4) chronic bronchitis from cigarette smoking or other inhaled environmental irritants; (5) nonasthmatic eosinophilic bronchitis; and (6) bronchiectasis.   These conditions, singly or in combination, have accounted for up to 94% of the causes of chronic cough in prospective studies.   Other conditions have constituted no >6% of the causes in prospective studies These have included bronchogenic carcinoma, chronic interstitial pneumonia, sarcoidosis, left ventricular failure, ACEI-induced cough, and aspiration from a condition associated with pharyngeal dysfunction.    Chronic cough is often simultaneously caused by more than one condition. A single cause has been found from 38 to 82% of the time, multiple causes from 18 to 62%. Multiply caused cough has been the result of three diseases up to 42% of the time.       Based on hx and exam, this is most likely:  Classic Upper airway cough syndrome, so named because it's frequently impossible to sort out how much is  CR/sinusitis with freq throat clearing (which can be related to primary GERD)   vs  causing  secondary (" extra esophageal")  GERD from wide swings in gastric pressure that occur with throat clearing, often  promoting self use of mint and menthol lozenges that reduce the lower esophageal sphincter tone and exacerbate the problem further in a cyclical fashion.   These are the same pts (now being labeled as having "irritable larynx syndrome" by some cough centers) who not infrequently have a history of having failed to tolerate ace inhibitors(as was the case here),  dry powder inhalers or biphosphonates or report having atypical reflux symptoms that don't respond to standard doses of PPI , and  are easily confused as having aecopd or asthma flares by even experienced allergists/ pulmonologists.   The first step is to maximize acid suppression and  then regroup in 6 wk  See instructions for specific recommendations which were reviewed directly with the patient who was given a copy with highlighter outlining the key components.

## 2013-10-27 NOTE — Patient Instructions (Addendum)
Pantoprazole (protonix) 40 mg   Take 30-60 min before first meal of the day and Pepcid 20 mg one bedtime until return to office - this is the best way to tell whether stomach acid is contributing to your problem.   GERD (REFLUX)  is an extremely common cause of respiratory symptoms, many times with no significant heartburn at all.    It can be treated with medication, but also with lifestyle changes including avoidance of late meals, excessive alcohol, smoking cessation, and avoid fatty foods, chocolate, peppermint, colas, red wine, and acidic juices such as orange juice.  NO MINT OR MENTHOL PRODUCTS SO NO COUGH DROPS  USE SUGARLESS CANDY INSTEAD (jolley ranchers or Stover's)  NO OIL BASED VITAMINS - use powdered substitutes.  Please remember to go to the lab and x-ray department downstairs for your tests - we will call you with the results when they are available.       Please schedule a follow up office visit in 6 weeks, call sooner if needed

## 2013-10-27 NOTE — Progress Notes (Signed)
Subjective:    Patient ID: Beverly Kim, female    DOB: 09/03/1924,   MRN: [id number]  HPI  89 yowf never smoker with ? Dust allergy as child no trouble with activities/ asthma until as an adult indolent onset while uphill to house noted doe x decades worse x 2012 referred 10/27/2013 to pulmonary clinic by Dr Alonza Smoker.    10/27/2013 1st [facility] office visit/ Kemya Shed  Chief Complaint  Patient presents with  . Pulmonary Consult    Referred by Dr. Tawanna Cooler. Pt c/o SOB "for years"- progressively getting worse. She states that she gets SOB when walking to her mailbox and breathing is esp worse after she eats. She also c/o non prod cough "for years".   worse after meals or if talking  Ok sitting still except also after meals  Sleeps propped up at least 30 degrees with immediate cough/sob at lower angles Baseline 20 lb x 15 y ago  Assoc with active hb/ not on maint ppi  No obvious other patterns in day to day or daytime variabilty or a  cp or chest tightness, subjective wheeze overt sinus or hb symptoms. No unusual exp hx or h/o childhood pna/ asthma or knowledge of premature birth.  Sleeping ok without nocturnal  or early am exacerbation  of respiratory  c/o's or need for noct saba. Also denies any obvious fluctuation of symptoms with weather or environmental changes or other aggravating or alleviating factors except as outlined above   Current Medications, Allergies, Complete Past Medical History, Past Surgical History, Family History, and Social History were reviewed in [employer] record.               Review of Systems  Constitutional: Negative for fever, chills and unexpected weight change.  HENT: Positive for congestion, sneezing, sore throat and trouble swallowing. Negative for dental problem, ear pain, nosebleeds, postnasal drip, rhinorrhea, sinus pressure and voice change.   Eyes: Negative for visual disturbance.  Respiratory: Positive for cough  and shortness of breath. Negative for choking.   Cardiovascular: Negative for chest pain and leg swelling.  Gastrointestinal: Negative for vomiting, abdominal pain and diarrhea.  Genitourinary: Negative for difficulty urinating.       Acid heartburn Indigestion  Musculoskeletal: Positive for arthralgias.  Skin: Negative for rash.  Neurological: Negative for tremors, syncope and headaches.  Hematological: Does not bruise/bleed easily.       Objective:   Physical Exam  Wt Readings from Last 3 Encounters:  10/27/13 184 lb 6.4 oz (83.643 kg)  07/17/13 184 lb 12.8 oz (83.825 kg)  02/03/13 181 lb (82.101 kg)      HEENT: nl dentition, turbinates, and orophanx. Nl external ear canals without cough reflex   NECK :  without JVD/Nodes/TM/ nl carotid upstrokes bilaterally   LUNGS: no acc muscle use, clear to A and P bilaterally without cough on insp or exp maneuvers   CV:  RRR  no s3 or murmur or increase in P2, no edema   ABD:  soft and nontender with nl excursion in the supine position. No bruits or organomegaly, bowel sounds nl  MS:  warm without deformities, calf tenderness, cyanosis or clubbing  SKIN: warm and dry without lesions    NEURO:  alert, approp, no deficits    12/23/12 No active cardiopulmonary disease.   CXR  10/27/2013 :  did not go for cxr as requested  Lab Results  Component Value Date   PROBNP 94.0 10/27/2013  Lab Results  Component Value Date   TSH 1.04 10/27/2013      Recent Labs Lab 10/27/13 1444  NA 138  K 4.7  CL 101  CO2 26  BUN 19  CREATININE 1.0  GLUCOSE 95    Recent Labs Lab 10/27/13 1444  HGB 14.4  HCT 42.6  WBC 8.4  PLT 254.0          Assessment & Plan:

## 2013-10-27 NOTE — Assessment & Plan Note (Addendum)
Symptoms are markedly disproportionate to objective findings and not clear this is a lung problem but pt does appear to have difficult airway management issues.   DDX of  difficult airways management all start with A and  include Adherence, Ace Inhibitors, Acid Reflux, Active Sinus Disease, Alpha 1 Antitripsin deficiency, Anxiety masquerading as Airways dz,  ABPA,  allergy(esp in young), Aspiration (esp in elderly), Adverse effects of DPI,  Active smokers, plus two Bs  = Bronchiectasis and Beta blocker use..and one C= CHF  Adherence is always the initial "prime suspect" and is a multilayered concern that requires a "trust but verify" approach in every patient - starting with knowing how to use medications, especially inhalers, correctly, keeping up with refills and understanding the fundamental difference between maintenance and prns vs those medications only taken for a very short course and then stopped and not refilled.   ? Acid (or non-acid) GERD > always difficult to exclude as up to 75% of pts in some series report no assoc GI/ Heartburn symptoms> rec max (24h)  acid suppression and diet restrictions/ reviewed and instructions given in writing.   ? chf > excluded by bnp < 100  ? Anxiety/ deconditioning dx of exclusion.

## 2013-10-28 NOTE — Progress Notes (Signed)
Quick Note:  Spoke with pt and notified of results per Dr. Wert. Pt verbalized understanding and denied any questions.  ______ 

## 2013-10-29 ENCOUNTER — Telehealth: Payer: Self-pay | Admitting: Internal Medicine

## 2013-10-29 MED ORDER — PANTOPRAZOLE SODIUM 40 MG PO TBEC: 40.0000 mg | DELAYED_RELEASE_TABLET | Freq: Every day | ORAL | Status: DC

## 2013-10-29 MED ORDER — FAMOTIDINE 20 MG PO TABS: 20.0000 mg | ORAL_TABLET | Freq: Every day | ORAL | Status: DC

## 2013-10-29 NOTE — Telephone Encounter (Signed)
Called and spoke with pt and she is aware of meds that have been sent to the pharmacy.  Nothing further is needed.  

## 2013-12-01 ENCOUNTER — Ambulatory Visit (INDEPENDENT_AMBULATORY_CARE_PROVIDER_SITE_OTHER): Payer: Commercial Managed Care - HMO | Admitting: Family Medicine

## 2013-12-01 ENCOUNTER — Encounter: Payer: Self-pay | Admitting: Family Medicine

## 2013-12-01 ENCOUNTER — Telehealth: Payer: Self-pay | Admitting: Internal Medicine

## 2013-12-01 VITALS — BP 130/80 | HR 83 | Temp 97.9°F | Wt 183.0 lb

## 2013-12-01 DIAGNOSIS — R058 Other specified cough: Secondary | ICD-10-CM

## 2013-12-01 DIAGNOSIS — R059 Cough, unspecified: Secondary | ICD-10-CM

## 2013-12-01 DIAGNOSIS — R05 Cough: Secondary | ICD-10-CM

## 2013-12-01 MED ORDER — PREDNISONE 20 MG PO TABS: ORAL_TABLET | ORAL | Status: DC

## 2013-12-01 NOTE — Patient Instructions (Signed)
Prednisone 20 mg............ 2 tabs x3 days then taper as outlined  Return when necessary

## 2013-12-01 NOTE — Telephone Encounter (Signed)
Called spoke with pt. She reports she has been working in the yard and has noticed increased chest congestion.  Pt saw PCP today and was given a course of prednisone.  Pt thinks she may have asthma but has never been tested for it per pt. She was told by PCP to have her tested. She thinks she may need an inhaler to help with her and times when she can't breathe. Please advise MW thanks

## 2013-12-01 NOTE — Progress Notes (Signed)
Pre visit review using our clinic review tool, if applicable. No additional management support is needed unless otherwise documented below in the visit note. 

## 2013-12-01 NOTE — Telephone Encounter (Signed)
Called spoke with pt. Aware of recs. She has appt scheduled for 12/08/13. Pt reports she has done the reflux regimen MW rcommended and reports she has not had any since starting this.  Nothing further needed

## 2013-12-01 NOTE — Telephone Encounter (Signed)
Yes it certainly is possible but wanted to see how she did with reflux rx since this is easier than teaching inhalers.  Let's see how she responds to prednisone and then plan on teaching her how to use an inhaler when she returns depending on response (if it's asthma the prednisone will knock it out quickly but then come back in a week or two and we should be seeing her by then to regroup)

## 2013-12-01 NOTE — Progress Notes (Signed)
   Subjective:    Patient ID: Beverly Kim, female    DOB: 08-26-1924, 80 y.o.   MRN: [id number]  HPI Abe People is a 80 year old female who is a nonsmoker who comes in today with a cold for one week and wheezing  History of allergic rhinitis and asthma. She said she developed a cold week ago and has been wheezing. No fever chills sputum production et Ronney Asters. She's able to lie flat. Pulse ox today on room air was 98%   Review of Systems    review of systems negative Objective:   Physical Exam  Well-developed well-nourished female no acute distress vital signs stable she is afebrile HEENT negative neck was supple no adenopathy lungs are clear except for some mildly expiratory wheezing on forced expiration symmetrical      Assessment & Plan:  Mild asthma plan prednisone burst and taper

## 2013-12-08 ENCOUNTER — Ambulatory Visit (INDEPENDENT_AMBULATORY_CARE_PROVIDER_SITE_OTHER): Payer: Commercial Managed Care - HMO | Admitting: Internal Medicine

## 2013-12-08 ENCOUNTER — Encounter: Payer: Self-pay | Admitting: Internal Medicine

## 2013-12-08 VITALS — BP 135/74 | HR 62 | Temp 98.0°F | Ht 62.0 in | Wt 185.0 lb

## 2013-12-08 DIAGNOSIS — I10 Essential (primary) hypertension: Secondary | ICD-10-CM

## 2013-12-08 DIAGNOSIS — R0600 Dyspnea, unspecified: Secondary | ICD-10-CM

## 2013-12-08 DIAGNOSIS — J45991 Cough variant asthma: Secondary | ICD-10-CM | POA: Insufficient documentation

## 2013-12-08 MED ORDER — BISOPROLOL FUMARATE 5 MG PO TABS: 5.0000 mg | ORAL_TABLET | Freq: Every day | ORAL | Status: DC

## 2013-12-08 NOTE — Progress Notes (Signed)
Subjective:    Patient ID: Beverly Kim, female    DOB: 15-Nov-1924,   MRN: [id number]    Brief patient profile:  52 yowf never smoker with ? Dust allergy as child no trouble with activities/ asthma until as an adult indolent onset   uphill to house noted doe x decades worse x 2012 referred 10/27/2013 to pulmonary clinic by Dr Alonza Smoker with variable FEV1 and ? pred responsive suggestive of asthma.   History of Present Illness  10/27/2013 1st [facility] office visit/ Beverly Kim  Chief Complaint  Patient presents with  . Pulmonary Consult    Referred by Dr. Tawanna Cooler. Pt c/o SOB "for years"- progressively getting worse. She states that she gets SOB when walking to her mailbox and breathing is esp worse after she eats. She also c/o non prod cough "for years".   worse after meals or if talking  Ok sitting still except also after meals  Sleeps propped up at least 30 degrees with immediate cough/sob at lower angles Baseline 20 lb x 15 y ago  Assoc with active hb/ not on maint ppi rec Pantoprazole (protonix) 40 mg   Take 30-60 min before first meal of the day and Pepcid 20 mg one bedtime until return to office - this is the best way to tell whether stomach acid is contributing to your problem.  GERD diet   12/08/2013 f/u ov/Beverly Kim re: ? Asthma > note Dr Tawanna Cooler documented wheeze on exam 11/04/13  Chief Complaint  Patient presents with  . Follow-up    Pt c/o increased SOB and cough for the past 2 wks- was seen by Dr Tawanna Cooler on 12/01/13 and was txed with prednisone and she states that this has helped some.   No response to acid suppression then worse cough x 2 weeks and better p pre rx  Mailbox to house easier p prednisone  Last night could not lie flat s coughing but better on prednisone also    No obvious   patterns in day to day or daytime variabilty or excessive/ purulent mucus or cp or chest tightness, subjective wheeze overt sinus or hb symptoms. No unusual exp hx or h/o childhood pna/ asthma or  knowledge of premature birth.   Also denies any obvious fluctuation of symptoms with weather or environmental changes or other aggravating or alleviating factors except as outlined above   Current Medications, Allergies, Complete Past Medical History, Past Surgical History, Family History, and Social History were reviewed in [employer] record.     ROS  The following are not active complaints unless bolded sore throat, dysphagia, dental problems, itching, sneezing,  nasal congestion or excess/ purulent secretions, ear ache,   fever, chills, sweats, unintended wt loss, pleuritic or exertional cp, hemoptysis,  orthopnea pnd or leg swelling, presyncope, palpitations, heartburn, abdominal pain, anorexia, nausea, vomiting, diarrhea  or change in bowel or urinary habits, change in stools or urine, dysuria,hematuria,  rash, arthralgias, visual complaints, headache, numbness weakness or ataxia or problems with walking or coordination,  change in mood/affect or memory.                   Objective:   Physical Exam  12/08/13           185 Wt Readings from Last 3 Encounters:  10/27/13 184 lb 6.4 oz (83.643 kg)  07/17/13 184 lb 12.8 oz (83.825 kg)  02/03/13 181 lb (82.101 kg)      HEENT: nl dentition, turbinates, and  orophanx. Nl external ear canals without cough reflex   NECK :  without JVD/Nodes/TM/ nl carotid upstrokes bilaterally   LUNGS: no acc muscle use, clear to A and P bilaterally without cough on insp or exp maneuvers   CV:  RRR  no s3 or murmur or increase in P2, no edema   ABD:  soft and nontender with nl excursion in the supine position. No bruits or organomegaly, bowel sounds nl  MS:  warm without deformities, calf tenderness, cyanosis or clubbing  SKIN: warm and dry without lesions    NEURO:  alert, approp, no deficits    12/23/12 No active cardiopulmonary disease.   CXR  10/27/2013 :  did not go for cxr as requested  Lab Results  Component  Value Date   PROBNP 94.0 10/27/2013     Lab Results  Component Value Date   TSH 1.04 10/27/2013      Recent Labs Lab 10/27/13 1444  NA 138  K 4.7  CL 101  CO2 26  BUN 19  CREATININE 1.0  GLUCOSE 95    Recent Labs Lab 10/27/13 1444  HGB 14.4  HCT 42.6  WBC 8.4  PLT 254.0          Assessment & Plan:

## 2013-12-08 NOTE — Patient Instructions (Addendum)
Bisoprolol 5 mg daily and stop lopressor  For cough > delsym 2 tsp every 12 hours as needed  For drainage take chlortrimeton (chlorpheniramine) 4 mg every 4 hours available over the counter (may cause drowsiness)  Pantoprazole (protonix) 40 mg   Take 30-60 min before first meal of the day and Pepcid 20 mg one bedtime until return to office - this is the best way to tell whether stomach acid is contributing to your problem.    GERD (REFLUX)  is an extremely common cause of respiratory symptoms, many times with no significant heartburn at all.    It can be treated with medication, but also with lifestyle changes including avoidance of late meals, excessive alcohol, smoking cessation, and avoid fatty foods, chocolate, peppermint, colas, red wine, and acidic juices such as orange juice.  NO MINT OR MENTHOL PRODUCTS SO NO COUGH DROPS  USE SUGARLESS CANDY INSTEAD (jolley ranchers or Stover's)  NO OIL BASED VITAMINS - use powdered substitutes.    Please schedule a follow up office visit in 2 weeks, sooner if needed to see Tammy with all medications including over counter meds  Late add needs cxr on return (never went as requested) Try singulair if not better next ov as doubt can teach hfa or and dpi tend to make cough worse    .

## 2013-12-09 NOTE — Assessment & Plan Note (Addendum)
Strongly prefer in this setting: Bystolic, the most beta -1  selective Beta blocker available in sample form, with bisoprolol the most selective generic choice  on the market.   Try bisoprolol 5 mg daily, rec break if half if too strong.

## 2013-12-09 NOTE — Assessment & Plan Note (Addendum)
Failed to control symptoms on GERD rx as of 12/07/13 so rec change lopressor to bisoprolol  If this corrects her problem ok to try off gerd rx next ov - if not will try singulair next

## 2013-12-09 NOTE — Assessment & Plan Note (Addendum)
-   PFT's 01/22/13  FEV1  1.57 (121%) ratio 72  - Spirometry 10/27/13  FEV1  1.04 (64%) ratio 67 - 10/27/2013  Walked RA x 3 laps @ nl pace/ @  185 ft each stopped due to  End of study no sob or desat    We have not been able to reproduce her symptoms here but I suspect she does have asthma by Dr Honor Junes eval 2 weeks ago and apparent improvement on prednisone - the problem is she says always symptomatic   and yet her problem is not reproducible in the office   Therefore Symptoms are markedly disproportionate to objective findings and not clear this is a lung problem but pt does appear to have difficult airway management issues. DDX of  difficult airways management all start with A and  include Adherence, Ace Inhibitors, Acid Reflux, Active Sinus Disease, Alpha 1 Antitripsin deficiency, Anxiety masquerading as Airways dz,  ABPA,  allergy(esp in young), Aspiration (esp in elderly), Adverse effects of DPI,  Active smokers, plus two Bs  = Bronchiectasis and Beta blocker use..and one C= CHF  Adherence is always the initial "prime suspect" and is a multilayered concern that requires a "trust but verify" approach in every patient - starting with knowing how to use medications, especially inhalers, correctly, keeping up with refills and understanding the fundamental difference between maintenance and prns vs those medications only taken for a very short course and then stopped and not refilled.  - needs to return for med reconciliation.  To keep things simple, I have asked the patient to first separate medicines that are perceived as maintenance, that is to be taken daily "no matter what", from those medicines that are taken on only on an as-needed basis and I have given the patient examples of both, and then return to see our NP to generate a  detailed  medication calendar which should be followed until the next physician sees the patient and updates it.   ? BB effect > see hbp  ? Allergy / consider trial of  singulair as it may be very difficult to teach adequate mdi or hfa here.  ? chf > excluded with bnp << 100   See instructions for specific recommendations which were reviewed directly with the patient who was given a copy with highlighter outlining the key components.

## 2013-12-23 ENCOUNTER — Ambulatory Visit (INDEPENDENT_AMBULATORY_CARE_PROVIDER_SITE_OTHER): Payer: Commercial Managed Care - HMO | Admitting: Adult Health

## 2013-12-23 ENCOUNTER — Ambulatory Visit (INDEPENDENT_AMBULATORY_CARE_PROVIDER_SITE_OTHER)
Admission: RE | Admit: 2013-12-23 | Discharge: 2013-12-23 | Disposition: A | Discharge status: Home / Self Care | Payer: Commercial Managed Care - HMO | Source: Ambulatory Visit | Attending: Internal Medicine | Admitting: Internal Medicine

## 2013-12-23 ENCOUNTER — Encounter: Payer: Self-pay | Admitting: Adult Health

## 2013-12-23 VITALS — BP 166/86 | HR 65 | Temp 97.0°F | Ht 62.0 in | Wt 189.2 lb

## 2013-12-23 DIAGNOSIS — R0600 Dyspnea, unspecified: Secondary | ICD-10-CM

## 2013-12-23 DIAGNOSIS — R05 Cough: Secondary | ICD-10-CM

## 2013-12-23 DIAGNOSIS — R058 Other specified cough: Secondary | ICD-10-CM

## 2013-12-23 DIAGNOSIS — I10 Essential (primary) hypertension: Secondary | ICD-10-CM

## 2013-12-23 DIAGNOSIS — J45991 Cough variant asthma: Secondary | ICD-10-CM

## 2013-12-23 DIAGNOSIS — R059 Cough, unspecified: Secondary | ICD-10-CM

## 2013-12-23 MED ORDER — MONTELUKAST SODIUM 10 MG PO TABS: 10.0000 mg | ORAL_TABLET | Freq: Every day | ORAL | Status: DC

## 2013-12-23 NOTE — Patient Instructions (Addendum)
Chest Xray today .   Begin Singulair 10mg  At bedtime    Continue on Bisoprolol 5 mg daily   For cough > delsym 2 tsp every 12 hours as needed  For drainage take chlortrimeton (chlorpheniramine) 4 mg every 4 hours available over the counter (may cause drowsiness)  Pantoprazole (protonix) 40 mg   Take 30-60 min before first meal of the day and Pepcid 20 mg one bedtime until return to office - this is the best way to tell whether stomach acid is contributing to your problem.    GERD (REFLUX)  is an extremely common cause of respiratory symptoms, many times with no significant heartburn at all.    It can be treated with medication, but also with lifestyle changes including avoidance of late meals, excessive alcohol, smoking cessation, and avoid fatty foods, chocolate, peppermint, colas, red wine, and acidic juices such as orange juice.  NO MINT OR MENTHOL PRODUCTS SO NO COUGH DROPS  USE SUGARLESS CANDY INSTEAD (jolley ranchers or Stover's)  NO OIL BASED VITAMINS - use powdered substitutes.   Follow up Dr. Melvyn Novas  In 6 weeks and As needed  .  Please contact office for sooner follow up if symptoms do not improve or worsen or seek emergency care   Late add : Increase Bisoprolol 10mg  daily  Follow up with PCP for b/p in 2 weeks

## 2013-12-24 NOTE — Progress Notes (Signed)
Quick Note:  Spoke with pt and notified of results per Dr. Wert. Pt verbalized understanding and denied any questions.  ______ 

## 2013-12-25 NOTE — Progress Notes (Signed)
Quick Note:  LMTCB ______ 

## 2013-12-25 NOTE — Progress Notes (Signed)
Quick Note:  Spoke with pt and notified of results per Dr. Wert. Pt verbalized understanding and denied any questions.  ______ 

## 2013-12-28 MED ORDER — BISOPROLOL FUMARATE 10 MG PO TABS: 10.0000 mg | ORAL_TABLET | Freq: Every day | ORAL | Status: DC

## 2013-12-28 NOTE — Assessment & Plan Note (Signed)
Possible atypical asthma , tx cough triggers   Plan  Begin Singulair 10mg  At bedtime    For cough > delsym 2 tsp every 12 hours as needed  For drainage take chlortrimeton (chlorpheniramine) 4 mg every 4 hours available over the counter (may cause drowsiness)  Pantoprazole (protonix) 40 mg   Take 30-60 min before first meal of the day and Pepcid 20 mg one bedtime until return to office - this is the best way to tell whether stomach acid is contributing to your problem.    GERD (REFLUX)  is an extremely common cause of respiratory symptoms, many times with no significant heartburn at all.    It can be treated with medication, but also with lifestyle changes including avoidance of late meals, excessive alcohol, smoking cessation, and avoid fatty foods, chocolate, peppermint, colas, red wine, and acidic juices such as orange juice.  NO MINT OR MENTHOL PRODUCTS SO NO COUGH DROPS  USE SUGARLESS CANDY INSTEAD (jolley ranchers or Stover's)  NO OIL BASED VITAMINS - use powdered substitutes.   Follow up Dr. Melvyn Novas  In 6 weeks and As needed  .  Please contact office for sooner follow up if symptoms do not improve or worsen or seek emergency care   Late add : Increase Bisoprolol 10mg  daily  Follow up with PCP for b/p in 2 weeks

## 2013-12-28 NOTE — Assessment & Plan Note (Addendum)
?  etiology  Workup thus far has been unrevealing w/ nml cxr , labs and echo w/ mild DD dysfunction with no sign of acute decompensation  Cont to control for upper airway irritation from reflux and rhinitis  Advised pt will need to follow up back with Cardiology to determine if further testing on their end is necessary .

## 2013-12-28 NOTE — Assessment & Plan Note (Signed)
Cont to control for triggers such as GERD and AR  CXR w/ no acute process.  Advised to remain off lopressor and avoid ACE inhibitor as may contribute to upper airway symptoms and shortness of breath.

## 2013-12-28 NOTE — Assessment & Plan Note (Addendum)
follow up with PCP /cardiology for b/p control  Advised on low salt diet  Remain off lopressor  Increase bisoprolol 10mg  daily

## 2013-12-28 NOTE — Progress Notes (Signed)
Subjective:    Patient ID: Beverly Kim, female    DOB: [birth year],   MRN: [id number]    Brief patient profile:  68 yowf never smoker with ? Dust allergy as child no trouble with activities/ asthma until as an adult indolent onset   uphill to house noted doe x decades worse x 2012 referred 10/27/2013 to pulmonary clinic by Dr Christie Nottingham with variable FEV1 and ? pred responsive suggestive of asthma.   History of Present Illness  10/27/2013 1st [facility] office visit/ Wert  Chief Complaint  Patient presents with  . Pulmonary Consult    Referred by Dr. Sherren Mocha. Pt c/o SOB "for years"- progressively getting worse. She states that she gets SOB when walking to her mailbox and breathing is esp worse after she eats. She also c/o non prod cough "for years".   worse after meals or if talking  Ok sitting still except also after meals  Sleeps propped up at least 30 degrees with immediate cough/sob at lower angles Baseline 20 lb x 15 y ago  Assoc with active hb/ not on maint ppi rec Pantoprazole (protonix) 40 mg   Take 30-60 min before first meal of the day and Pepcid 20 mg one bedtime until return to office - this is the best way to tell whether stomach acid is contributing to your problem.  GERD diet   12/08/2013 f/u ov/Wert re: ? Asthma > note Dr Sherren Mocha documented wheeze on exam 11/04/13  Chief Complaint  Patient presents with  . Follow-up    Pt c/o increased SOB and cough for the past 2 wks- was seen by Dr Sherren Mocha on 12/01/13 and was txed with prednisone and she states that this has helped some.   No response to acid suppression then worse cough x 2 weeks and better p pre rx  Mailbox to house easier p prednisone  Last night could not lie flat s coughing but better on prednisone also  >>stop lopressor , begin bisoprolol , deslym for cough  , PPI /pepcid for GERD , chlortrimeton for drainage      12/23/13 Follow up (Atypical Asthma-cough variant asthma)  Pt returns for a 2 week follow up for  dyspnea and cough.  Last ov was recommended to use Protonix and pepcid for reflux , changed lopressor to bisoprolol and delsym and chlortrimeton for cough and drainage.  Does not feel she is getting much better , feels short of breath and still has reflux. She is worried the the GERD is hurting her esophagus.   She has not seen much change in last 2 weeks.  CXR today with no acute findings.  Previous labs were unrevealing with nml cbc , tsh, and bnp  She has known nonobstructive CAD followed by Dr. Percival Spanish in [place] .  Echo 2013 with nml EF , Gr 1 DD and stresss myoview w/ no evidence of ischemia  PFT 2014 showed nml lung volumes , no sign BD response and mildly reduced diffucing capacity  No ambulatory desaturations previously.  She denies chest pain, exertional chest pain, fever , discolored mucus or increased edema.  B/p at home has been 130/70 .     Current Medications, Allergies, Complete Past Medical History, Past Surgical History, Family History, and Social History were reviewed in [employer] record.     ROS  The following are not active complaints unless bolded sore throat, dysphagia, dental problems, itching, sneezing,  nasal congestion or excess/ purulent secretions, ear ache,  fever, chills, sweats, unintended wt loss, pleuritic or exertional cp, hemoptysis,  orthopnea pnd or leg swelling, presyncope, palpitations, heartburn, abdominal pain, anorexia, nausea, vomiting, diarrhea  or change in bowel or urinary habits, change in stools or urine, dysuria,hematuria,  rash, arthralgias, visual complaints, headache, numbness weakness or ataxia or problems with walking or coordination,  change in mood/affect or memory.                   Objective:   Physical Exam  12/08/13           185>   189 12/23/13   HEENT: nl dentition, turbinates, and orophanx. Nl external ear canals without cough reflex   NECK :  without JVD/Nodes/TM/ nl carotid upstrokes  bilaterally   LUNGS: no acc muscle use, clear to A and P bilaterally without cough on insp or exp maneuvers   CV:  RRR  no s3 or murmur or increase in P2, no edema   ABD:  soft and nontender with nl excursion in the supine position. No bruits or organomegaly, bowel sounds nl  MS:  warm without deformities, calf tenderness, cyanosis or clubbing  SKIN: warm and dry without lesions    NEURO:  alert, approp, no deficits    12/23/12 No active cardiopulmonary disease.    Lab Results  Component Value Date   PROBNP 94.0 10/27/2013     Lab Results  Component Value Date   TSH 1.04 10/27/2013      Recent Labs Lab 10/27/13 1444  NA 138  K 4.7  CL 101  CO2 26  BUN 19  CREATININE 1.0  GLUCOSE 95    Recent Labs Lab 10/27/13 1444  HGB 14.4  HCT 42.6  WBC 8.4  PLT 254.0          Assessment & Plan:

## 2013-12-28 NOTE — Progress Notes (Signed)
Chart/ ov reviewed and agree with a/p as outlined

## 2013-12-29 ENCOUNTER — Telehealth: Payer: Self-pay | Admitting: Internal Medicine

## 2013-12-29 NOTE — Telephone Encounter (Signed)
Spoke with the pt  She is calling to verify that the bisoprolol is 10 mg and not just 5 mg  I advised that it should be the 10 mg tablets once per day  She verbalized understanding and has already picked up rx for this  Pt states nothing further needed and denies any problems at this time

## 2014-01-20 ENCOUNTER — Encounter: Payer: Self-pay | Admitting: Cardiology

## 2014-01-20 ENCOUNTER — Ambulatory Visit (INDEPENDENT_AMBULATORY_CARE_PROVIDER_SITE_OTHER): Payer: Commercial Managed Care - HMO | Admitting: Cardiology

## 2014-01-20 VITALS — BP 137/70 | HR 61 | Ht 62.0 in | Wt 184.6 lb

## 2014-01-20 DIAGNOSIS — R0600 Dyspnea, unspecified: Secondary | ICD-10-CM

## 2014-01-20 NOTE — Progress Notes (Signed)
HPI The patient presents for evaluation of dyspnea.  She has a past history of nonobstructive coronary disease with a 70% LAD stenosis in 2003. In 2013 she had an echo which was low normal LV function but mild mitral regurgitation. In November of 2013 she had a stress perfusion study with no evidence of ischemia or infarct. She has had some suggestion of diastolic dysfunction on echo. She might of had some improvement in her breathing with diuretics. She has had pulmonary function tests which demonstrated normal lung volumes and only a mildly reduced diffusion capacity. Chest x-ray was unremarkable. She did not want to take Prilosec for treatment of possible reflux.  Of note her sats were 93 - 94% walking around despite describing significant SOB.   Since last saw her she Wert.   He change her beta blocker to bisoprolol. She's not taking medications for reflux. He has treated her with Singulair. Unfortunately she doesn't think her breathing is particularly better. She says she is weak in the legs. She she still dyspneic with any activities. She denies any chest pressure, neck or arm discomfort. She denies any weight gain. Again walking around the office she was quite dyspneic and fatigued but her saturation stayed at 95% on room air.  Allergies  Allergen Reactions  . Adhesive [Tape] Itching, Dermatitis and Rash    Blisters and "skin bubbles"  . Atorvastatin     REACTION: Reaction not known  . Clindamycin   . Codeine   . Latex   . Penicillins   . Simvastatin     REACTION: fatigue    Current Outpatient Prescriptions  Medication Sig Dispense Refill  . aspirin 81 MG tablet Take 81 mg by mouth daily.    . bisoprolol (ZEBETA) 10 MG tablet Take 1 tablet (10 mg total) by mouth daily. 90 tablet 0  . famotidine (PEPCID) 20 MG tablet Take 1 tablet (20 mg total) by mouth at bedtime. 30 tablet 5  . furosemide (LASIX) 20 MG tablet Take 1 tablet (20 mg total) by mouth daily. 90 tablet 1  . montelukast  (SINGULAIR) 10 MG tablet Take 1 tablet (10 mg total) by mouth at bedtime. 30 tablet 11  . Multiple Vitamins-Minerals (MULTIVITAMIN PO) Take by mouth daily.    . pantoprazole (PROTONIX) 40 MG tablet Take 1 tablet (40 mg total) by mouth daily. 30 minutes before a meal 30 tablet 5  . SYNTHROID 100 MCG tablet TAKE ONE TABLET BY MOUTH EVERY DAY 100 tablet 0  . vitamin C (ASCORBIC ACID) 500 MG tablet Take 500 mg by mouth daily.     No current facility-administered medications for this visit.    Past Medical History  Diagnosis Date  . Other left bundle branch block   . Hypertension   . Palpitations   . Mitral valve disorders   . Dyspnea   . Hyperlipidemia   . URI (upper respiratory infection)   . Low back pain   . Hypothyroidism   . Coronary artery disease   . CHF (congestive heart failure)     Past Surgical History  Procedure Laterality Date  . Colonoscopy    . Cb    . Cb    . Total abdominal hysterectomy w/ bilateral salpingoophorectomy    . Tonsillectomy and adenoidectomy    . Thyroidectomy    . Cardiac catheterization  04-18-01  . Hernia repair      ROS:  As stated in the HPI and negative for all other systems.  PHYSICAL EXAM BP 137/70 mmHg  Pulse 61  Ht 5\' 2"  (1.575 m)  Wt 184 lb 9.6 oz (83.734 kg)  BMI 33.76 kg/m2 GENERAL:  Well appearing NECK:  No jugular venous distention, waveform within normal limits, carotid upstroke brisk and symmetric, no bruits, no thyromegaly LUNGS:  Clear to auscultation bilaterally BACK:  No CVA tenderness CHEST:  Unremarkable HEART:  PMI not displaced or sustained,S1 and S2 within normal limits, no S3, no S4, no clicks, no rubs, no murmurs ABD:  Flat, positive bowel sounds normal in frequency in pitch, no bruits, no rebound, no guarding, no midline pulsatile mass, no hepatomegaly, no splenomegaly EXT:  2 plus pulses throughout, trace edema, no cyanosis no clubbing   ASSESSMENT AND PLAN   DYSPNEA:  I did not see a cardiac etiology.  However, this is quite disabling to her. I will repeat another echocardiogram as it has been 2 years. If there is no suggestion of right heart dysfunction pulmonary hypertension which I suspect on physical exam I don't think that right heart catheterization would be helpful. I will consider doing another stress perfusion study. For now I think the medications are okay.  HTN:  This seems to be well treated on the bisoprolol. She will continue with this.  EDEMA:  This is only trace.  No change in therapy is indicated.

## 2014-01-20 NOTE — Patient Instructions (Signed)
Your physician recommends that you schedule a follow-up appointment in: one month  We are ordering an Echo for you to get done

## 2014-01-25 ENCOUNTER — Ambulatory Visit (HOSPITAL_COMMUNITY)
Admission: RE | Admit: 2014-01-25 | Discharge: 2014-01-25 | Disposition: A | Discharge status: Home / Self Care | Payer: Medicare HMO | Source: Ambulatory Visit | Attending: Cardiology | Admitting: Cardiology

## 2014-01-25 DIAGNOSIS — R0602 Shortness of breath: Secondary | ICD-10-CM | POA: Insufficient documentation

## 2014-01-25 DIAGNOSIS — R0600 Dyspnea, unspecified: Secondary | ICD-10-CM

## 2014-01-25 DIAGNOSIS — I059 Rheumatic mitral valve disease, unspecified: Secondary | ICD-10-CM

## 2014-01-25 NOTE — Progress Notes (Signed)
2D Echo Performed 01/25/2014    Marygrace Drought, RCS

## 2014-02-03 ENCOUNTER — Ambulatory Visit: Payer: Commercial Managed Care - HMO | Admitting: Internal Medicine

## 2014-02-04 ENCOUNTER — Other Ambulatory Visit: Payer: Self-pay | Admitting: *Deleted

## 2014-02-04 MED ORDER — LEVOTHYROXINE SODIUM 100 MCG PO TABS: 100.0000 ug | ORAL_TABLET | Freq: Every day | ORAL | Status: DC

## 2014-02-04 NOTE — Telephone Encounter (Signed)
Patient would like to try generic synthroid.  New rx sent.

## 2014-02-09 ENCOUNTER — Encounter: Payer: Self-pay | Admitting: Internal Medicine

## 2014-02-09 ENCOUNTER — Other Ambulatory Visit (INDEPENDENT_AMBULATORY_CARE_PROVIDER_SITE_OTHER): Payer: Commercial Managed Care - HMO

## 2014-02-09 ENCOUNTER — Ambulatory Visit (INDEPENDENT_AMBULATORY_CARE_PROVIDER_SITE_OTHER): Payer: Commercial Managed Care - HMO | Admitting: Internal Medicine

## 2014-02-09 ENCOUNTER — Ambulatory Visit (INDEPENDENT_AMBULATORY_CARE_PROVIDER_SITE_OTHER)
Admission: RE | Admit: 2014-02-09 | Discharge: 2014-02-09 | Disposition: A | Discharge status: Home / Self Care | Payer: Commercial Managed Care - HMO | Source: Ambulatory Visit | Attending: Internal Medicine | Admitting: Internal Medicine

## 2014-02-09 VITALS — BP 130/80 | HR 65 | Ht 62.0 in | Wt 185.0 lb

## 2014-02-09 DIAGNOSIS — R0600 Dyspnea, unspecified: Secondary | ICD-10-CM

## 2014-02-09 DIAGNOSIS — I5032 Chronic diastolic (congestive) heart failure: Secondary | ICD-10-CM

## 2014-02-09 DIAGNOSIS — R058 Other specified cough: Secondary | ICD-10-CM

## 2014-02-09 DIAGNOSIS — R05 Cough: Secondary | ICD-10-CM

## 2014-02-09 LAB — CBC WITH DIFFERENTIAL/PLATELET
BASOS ABS: 0 10*3/uL (ref 0.0–0.1)
Basophils Relative: 0.6 % (ref 0.0–3.0)
EOS ABS: 0.2 10*3/uL (ref 0.0–0.7)
EOS PCT: 2.6 % (ref 0.0–5.0)
HCT: 40.1 % (ref 36.0–46.0)
Hemoglobin: 13.4 g/dL (ref 12.0–15.0)
Lymphocytes Relative: 30.6 % (ref 12.0–46.0)
Lymphs Abs: 2 10*3/uL (ref 0.7–4.0)
MCHC: 33.4 g/dL (ref 30.0–36.0)
MCV: 94.1 fl (ref 78.0–100.0)
MONOS PCT: 8.8 % (ref 3.0–12.0)
Monocytes Absolute: 0.6 10*3/uL (ref 0.1–1.0)
NEUTROS ABS: 3.8 10*3/uL (ref 1.4–7.7)
NEUTROS PCT: 57.4 % (ref 43.0–77.0)
Platelets: 234 10*3/uL (ref 150.0–400.0)
RBC: 4.26 Mil/uL (ref 3.87–5.11)
RDW: 14.7 % (ref 11.5–15.5)
WBC: 6.6 10*3/uL (ref 4.0–10.5)

## 2014-02-09 LAB — BASIC METABOLIC PANEL
BUN: 19 mg/dL (ref 6–23)
CHLORIDE: 103 meq/L (ref 96–112)
CO2: 28 mEq/L (ref 19–32)
CREATININE: 1.1 mg/dL (ref 0.4–1.2)
Calcium: 9.7 mg/dL (ref 8.4–10.5)
GFR: 49.89 mL/min — ABNORMAL LOW (ref >60.00)
Glucose, Bld: 115 mg/dL — ABNORMAL HIGH (ref 70–99)
Potassium: 4.7 mEq/L (ref 3.5–5.1)
Sodium: 139 mEq/L (ref 135–145)

## 2014-02-09 LAB — TSH: TSH: 1.11 u[IU]/mL (ref 0.35–4.50)

## 2014-02-09 LAB — BRAIN NATRIURETIC PEPTIDE: PRO B NATRI PEPTIDE: 328 pg/mL — AB (ref 0.0–100.0)

## 2014-02-09 MED ORDER — PREDNISONE 10 MG PO TABS: ORAL_TABLET | ORAL | Status: DC

## 2014-02-09 NOTE — Patient Instructions (Addendum)
Stop singulair (monteklukast) to see what difference this makes   Protonix / synthroid can be taken 30-60 min before breakfast daily together   Prednisone 10 mg take  4 each am x 2 days,   2 each am x 2 days,  1 each am x 2 days and stop   Please see patient coordinator before you leave today  to schedule diagnostic esophagram   Please remember to go to the lab and x-ray department downstairs for your tests - we will call you with the results when they are available.    See Tammy NP w/in 2 weeks with all your pillboxes and  your medications, even over the counter meds, separated in two separate bags, the ones you take no matter what vs the ones you stop once you feel better and take only as needed when you feel you need them.   Tammy  will generate for you a new user friendly medication calendar that will put [place] all on the same page re: your medication use.     Without this process, it simply isn't possible to assure that we are providing  your outpatient care  with  the attention to detail we feel you deserve.   If we cannot assure that you're getting that kind of care,  then we cannot manage your problem effectively from this clinic.  Once you have seen Tammy and we are sure that we're all on the same page with your medication use she will arrange follow up with me.

## 2014-02-09 NOTE — Assessment & Plan Note (Signed)
01/25/14 Echo Normal LV size with mild LV hypertrophy. EF 45-50% with inferior and inferoseptal hypokinesis. Moderate diastolic dysfunction. Normal RV size and systolic function. Moderate mitral regurgitation, possibly due to restriction of posterior leaflet   rx per Dr Percival Spanish

## 2014-02-09 NOTE — Assessment & Plan Note (Addendum)
-  Max gerd rx 10/27/2013 >>> -singulair trial > d/c 02/09/2014  - Allergy profile 02/09/2014 >>>  - Dg Es 02/09/2014 >>>  Cough has improved though not completely resolved  Reviewed rx / plan to complete w/u as above

## 2014-02-09 NOTE — Assessment & Plan Note (Addendum)
-   PFT's 01/22/13  FEV1  1.57 (121%) ratio 72  - Spirometry 10/27/13  FEV1  1.04 (64%) ratio 67 - 10/27/2013  Walked RA x 3 laps @ nl pace/ @  185 ft each stopped due to  End of study no sob or desat  - 02/09/2014  Walked RA  2 laps @ 185 ft each stopped due to  Legs weak, short of breath, lowest sat 96%, nl pace, HR 87 peak  I had an extended discussion with the patient and family reviewing all relevant studies completed to date and  lasting35 minutes of a 45 minute visit on the following ongoing concerns:   The noct choking/coughing have responding to rx for gerd with pepcid and diet  The daytime symptoms are not reproducible even though her symptoms are reported to be daily / refractory and disproportionate to present findings  DDX of  difficult airways management all start with A and  include Adherence, Ace Inhibitors, Acid Reflux, Active Sinus Disease, Alpha 1 Antitripsin deficiency, Anxiety masquerading as Airways dz,  ABPA,  allergy(esp in young), Aspiration (esp in elderly), Adverse effects of DPI,  Active smokers, plus two Bs  = Bronchiectasis and Beta blocker use..and one C= CHF   ? Acid (or non-acid) GERD > always difficult to exclude as up to 75% of pts in some series report no assoc GI/ Heartburn symptoms> rec cont max (24h)  acid suppression and diet restrictions/ reviewed and instructions given in writing. > add Dg esophagram  ? Allergy/asthma > she's convinced singulair making her worse so d/c it and rx  Prednisone 10 mg take  4 each am x 2 days,   2 each am x 2 days,  1 each am x 2 days and stop   ? Anxiety > usually dx of exclusion  ? chf > BNP intermediate and c/w diastolic chf reflected in echo 01/25/14 reviewed and  already addresseed by dr Percival Spanish with BB/ diuretic  Next step is full/accurate med reconciliation.  To keep things simple, I have asked the patient to first separate medicines that are perceived as maintenance, that is to be taken daily "no matter what", from those  medicines that are taken on only on an as-needed basis and I have given the patient examples of both, and then return to see our NP to generate a  detailed  medication calendar which should be followed until the next physician sees the patient and updates it.

## 2014-02-09 NOTE — Progress Notes (Signed)
Subjective:    Patient ID: Beverly Kim, female    DOB: 25-Aug-1924,   MRN: [id number]    Brief patient profile:  81 yowf never smoker with ? Dust allergy as child no trouble with activities/ asthma until as an adult indolent onset  uphill to house from mailbox 45 degrees noted doe x decades worse x 2012 referred 10/27/2013 to pulmonary clinic by Dr Alonza Smoker with variable FEV1 and ? pred responsive suggestive of asthma.   History of Present Illness  10/27/2013 1st [facility] office visit/ Beverly Kim  Chief Complaint  Patient presents with  . Pulmonary Consult    Referred by Dr. Tawanna Cooler. Pt c/o SOB "for years"- progressively getting worse. She states that she gets SOB when walking to her mailbox and breathing is esp worse after she eats. She also c/o non prod cough "for years".   worse after meals or if talking  Ok sitting still except also after meals  Sleeps propped up at least 30 degrees with immediate cough/sob at lower angles Baseline 20 lb x 15 y ago  Assoc with active hb/ not on maint ppi rec Pantoprazole (protonix) 40 mg   Take 30-60 min before first meal of the day and Pepcid 20 mg one bedtime  GERD diet   12/08/2013 f/u ov/Beverly Kim re: ? Asthma > note Dr Tawanna Cooler documented wheeze on exam 11/04/13  Chief Complaint  Patient presents with  . Follow-up    Pt c/o increased SOB and cough for the past 2 wks- was seen by Dr Tawanna Cooler on 12/01/13 and was txed with prednisone and she states that this has helped some.   No response to acid suppression then worse cough x 2 weeks and better p pre rx  Mailbox to house easier p prednisone  Last night could not lie flat s coughing but better on prednisone also  >>stop lopressor , begin bisoprolol , deslym for cough  , PPI /pepcid for GERD , chlortrimeton for drainage      12/23/13 Follow up (Atypical Asthma-cough variant asthma)  Pt returns for a 2 week follow up for dyspnea and cough.  Last ov was recommended to use Protonix and pepcid for reflux ,  changed lopressor to bisoprolol and delsym and chlortrimeton for cough and drainage.  Does not feel she is getting much better , feels short of breath and still has reflux. She is worried the the GERD is hurting her esophagus.   She has not seen much change in last 2 weeks.  CXR today with no acute findings.  Previous labs were unrevealing with nml cbc , tsh, and bnp  She has known nonobstructive CAD followed [place] Dr. Antoine Poche in [place] .  Echo 2013 with nml EF , Gr 1 DD and stresss myoview w/ no evidence of ischemia  PFT 2014 showed nml lung volumes , no sign BD response and mildly reduced diffucing capacity  No ambulatory desaturations previously.   rec Chest Xray today .  Begin Singulair 10mg  At bedtime   Continue on Bisoprolol 5 mg daily  For cough > delsym 2 tsp every 12 hours as needed For drainage take chlortrimeton (chlorpheniramine) 4 mg every 4 hours available over the counter (may cause drowsiness) Pantoprazole (protonix) 40 mg   Take 30-60 min before first meal of the day and Pepcid 20 mg one bedtime until return to office -  GERD diet  Late add : Increase Bisoprolol 10mg  daily  Follow up with PCP for b/p in 2 weeks  02/09/2014 extended  ov/Beverly Kim re: multiple complaints sob, dysphagia  > cough Chief Complaint  Patient presents with  . Follow-up    Pt states that her breathing is no better since the last visit. She states that she is not couging as much.  No new co's today.   now able to lie flat most nights s choking/ coughing Most of her symptoms occur walking up from mb to house or after eating  singulair makes nose close up  clariton makes bp go up Severe nasal congestion/ watery drainage/ did not apparently try 1st gen H1 as prev rec  Prednisone has always worked in past but afraid to take it Much worse x 6 weeks per fm but still able to get up from mb s stopping but can't take a shower s panting for breath per fm "I know my meds"  (doesn't know any of the names)   No  obvious day to day or daytime variabilty or assoc chronic cough or cp or chest tightness, subjective wheeze overt sinus or hb symptoms. No unusual exp hx or h/o childhood pna/ asthma or knowledge of premature birth.  Sleeping ok without nocturnal  or early am exacerbation  of respiratory  c/o's or need for noct saba. Also denies any obvious fluctuation of symptoms with weather or environmental changes or other aggravating or alleviating factors except as outlined above   Current Medications, Allergies, Complete Past Medical History, Past Surgical History, Family History, and Social History were reviewed in [employer] record.  ROS  The following are not active complaints unless bolded sore throat, dysphagia, dental problems, itching, sneezing,  nasal congestion or excess/ purulent secretions, ear ache,   fever, chills, sweats, unintended wt loss, pleuritic or exertional cp, hemoptysis,  orthopnea pnd or leg swelling, presyncope, palpitations, heartburn, abdominal pain, anorexia, nausea, vomiting, diarrhea  or change in bowel or urinary habits, change in stools or urine, dysuria,hematuria,  rash, arthralgias, visual complaints, headache, numbness weakness or ataxia or problems with walking or coordination,  change in mood/affect or memory.           Objective:   Physical Exam  Elderly wf Patient failed to answer a single question asked in a straightforward manner, tending to go off on tangents or answer questions with ambiguous medical terms or diagnoses and seemed aggravated  when asked the same question more than once for clarification.  "I've got esophageal spasms when I eat"  - points to suprasternal notch - "it's my allergies"  12/08/13           185>   189 12/23/13 > 02/09/2014 186   HEENT: nl dentition, turbinates, and orophanx. Nl external ear canals without cough reflex   NECK :  without JVD/Nodes/TM/ nl carotid upstrokes bilaterally   LUNGS: no acc muscle use,  trace insp crackles  bilaterally without cough on insp or exp maneuvers   CV:  RRR  no s3 or murmur or increase in P2, no edema   ABD:  soft and nontender with nl excursion in the supine position. No bruits or organomegaly, bowel sounds nl  MS:  warm without deformities, calf tenderness, cyanosis or clubbing  SKIN: warm and dry without lesions    NEURO:  alert, approp, no deficits    CXR PA and Lateral:   02/09/2014 :  Cardiomegarly/ mild T kyphosis s chf/ effusions   Recent Labs Lab 02/09/14 1618  NA 139  K 4.7  CL 103  CO2 28  BUN  19  CREATININE 1.1  GLUCOSE 115*    Recent Labs Lab 02/09/14 1618  HGB 13.4  HCT 40.1  WBC 6.6  PLT 234.0     Lab Results  Component Value Date   TSH 1.11 02/09/2014     Lab Results  Component Value Date   PROBNP 328.0* 02/09/2014                 Assessment & Plan:

## 2014-02-10 NOTE — Progress Notes (Signed)
Quick Note:  Spoke with pt and notified of results per Dr. Wert. Pt verbalized understanding and denied any questions.  ______ 

## 2014-02-15 ENCOUNTER — Ambulatory Visit (HOSPITAL_COMMUNITY): Admission: RE | Admit: 2014-02-15 | Payer: Medicare HMO | Source: Ambulatory Visit

## 2014-02-17 ENCOUNTER — Ambulatory Visit (HOSPITAL_COMMUNITY)
Admission: RE | Admit: 2014-02-17 | Discharge: 2014-02-17 | Disposition: A | Discharge status: Home / Self Care | Payer: Commercial Managed Care - HMO | Source: Ambulatory Visit | Attending: Internal Medicine | Admitting: Internal Medicine

## 2014-02-17 DIAGNOSIS — K449 Diaphragmatic hernia without obstruction or gangrene: Secondary | ICD-10-CM | POA: Diagnosis not present

## 2014-02-17 DIAGNOSIS — R05 Cough: Secondary | ICD-10-CM | POA: Insufficient documentation

## 2014-02-17 DIAGNOSIS — R058 Other specified cough: Secondary | ICD-10-CM

## 2014-02-17 DIAGNOSIS — K219 Gastro-esophageal reflux disease without esophagitis: Secondary | ICD-10-CM | POA: Insufficient documentation

## 2014-02-18 ENCOUNTER — Other Ambulatory Visit: Payer: Self-pay | Admitting: Internal Medicine

## 2014-02-18 DIAGNOSIS — K219 Gastro-esophageal reflux disease without esophagitis: Secondary | ICD-10-CM

## 2014-02-18 NOTE — Progress Notes (Signed)
Quick Note:  Spoke with pt and notified of results per Dr. Wert. Pt verbalized understanding and denied any questions.  ______ 

## 2014-02-19 ENCOUNTER — Encounter: Payer: Self-pay | Admitting: Gastroenterology

## 2014-02-23 ENCOUNTER — Encounter: Payer: Self-pay | Admitting: Adult Health

## 2014-02-23 ENCOUNTER — Ambulatory Visit (INDEPENDENT_AMBULATORY_CARE_PROVIDER_SITE_OTHER): Payer: Commercial Managed Care - HMO | Admitting: Adult Health

## 2014-02-23 VITALS — BP 124/72 | HR 67 | Temp 98.4°F | Wt 186.8 lb

## 2014-02-23 DIAGNOSIS — J45991 Cough variant asthma: Secondary | ICD-10-CM

## 2014-02-23 NOTE — Assessment & Plan Note (Signed)
Cough with triggers of AR /GERD  Improving with trigger control   Plan  Follow med calendar closely and bring to each visit Continue current regimen. May use of Allegra 180 mg daily for drainage May add Chlor-Trimeton 4 mg at bedtime as needed. For postnasal drip and throat clearing Follow-up with GI as planned this week. Follow up Dr. Melvyn Novas  In 6 weeks and As needed  .

## 2014-02-23 NOTE — Progress Notes (Signed)
Subjective:    Patient ID: Beverly Kim, female    DOB: 06/01/1924,   MRN: [id number]    Brief patient profile:  66 yowf never smoker with ? Dust allergy as child no trouble with activities/ asthma until as an adult indolent onset  uphill to house from [place] 45 degrees noted doe x decades worse x 2012 referred 10/27/2013 to pulmonary clinic by Dr Christie Nottingham with variable FEV1 and ? pred responsive suggestive of asthma.   History of Present Illness  10/27/2013 1st [facility] office visit/ Wert  Chief Complaint  Patient presents with  . Pulmonary Consult    Referred by Dr. Sherren Mocha. Pt c/o SOB "for years"- progressively getting worse. She states that she gets SOB when walking to her mailbox and breathing is esp worse after she eats. She also c/o non prod cough "for years".   worse after meals or if talking  Ok sitting still except also after meals  Sleeps propped up at least 30 degrees with immediate cough/sob at lower angles Baseline 20 lb x 15 y ago  Assoc with active hb/ not on maint ppi rec Pantoprazole (protonix) 40 mg   Take 30-60 min before first meal of the day and Pepcid 20 mg one bedtime  GERD diet   12/08/2013 f/u ov/Wert re: ? Asthma > note Dr Sherren Mocha documented wheeze on exam 11/04/13  Chief Complaint  Patient presents with  . Follow-up    Pt c/o increased SOB and cough for the past 2 wks- was seen by Dr Sherren Mocha on 12/01/13 and was txed with prednisone and she states that this has helped some.   No response to acid suppression then worse cough x 2 weeks and better p pre rx  Mailbox to house easier p prednisone  Last night could not lie flat s coughing but better on prednisone also  >>stop lopressor , begin bisoprolol , deslym for cough  , PPI /pepcid for GERD , chlortrimeton for drainage      12/23/13 Follow up (Atypical Asthma-cough variant asthma)  Pt returns for a 2 week follow up for dyspnea and cough.  Last ov was recommended to use Protonix and pepcid for reflux ,  changed lopressor to bisoprolol and delsym and chlortrimeton for cough and drainage.  Does not feel she is getting much better , feels short of breath and still has reflux. She is worried the the GERD is hurting her esophagus.   She has not seen much change in last 2 weeks.  CXR today with no acute findings.  Previous labs were unrevealing with nml cbc , tsh, and bnp  She has known nonobstructive CAD followed [place] Dr. Percival Spanish [place] .  Echo 2013 with nml EF , Gr 1 DD and stresss myoview w/ no evidence of ischemia  PFT 2014 showed nml lung volumes , no sign BD response and mildly reduced diffucing capacity  No ambulatory desaturations previously.   rec Chest Xray today .  Begin Singulair 10mg  At bedtime   Continue on Bisoprolol 5 mg daily  For cough > delsym 2 tsp every 12 hours as needed For drainage take chlortrimeton (chlorpheniramine) 4 mg every 4 hours available over the counter (may cause drowsiness) Pantoprazole (protonix) 40 mg   Take 30-60 min before first meal of the day and Pepcid 20 mg one bedtime until return to office -  GERD diet  Late add : Increase Bisoprolol 10mg  daily  Follow up with PCP for b/p in 2 weeks  02/09/2014 extended  ov/Wert re: multiple complaints sob, dysphagia  > cough Chief Complaint  Patient presents with  . Follow-up    Pt states that her breathing is no better since the last visit. She states that she is not couging as much.  No new co's today.   now able to lie flat most nights s choking/ coughing Most of her symptoms occur walking up from mb to house or after eating  singulair makes nose close up  clariton makes bp go up Severe nasal congestion/ watery drainage/ did not apparently try 1st gen H1 as prev rec  Prednisone has always worked in past but afraid to take it Much worse x 6 weeks per fm but still able to get up from mb s stopping but can't take a shower s panting for breath per fm "I know my meds"  (doesn't know any of the names)  >d/c  singulair , PPI rx , pred taper .      02/23/2014 follow-up and medication review Patient returns for a follow-up and medication review We reviewed all her medications organize them into a medication count with patient education. It appears that she is taking her medications correctly. Last visit with flare of her cough. She was treated with a prednisone taper. Advised to take Protonix daily. And was recommended to discontinue Singulair. Since last visit. Patient reports that her cough is improved Still has drippy nose and drainage .  No fever ,chest pain,orthopnea , or edema.       Current Medications, Allergies, Complete Past Medical History, Past Surgical History, Family History, and Social History were reviewed in [employer] record.  ROS  The following are not active complaints unless bolded sore throat, dysphagia, dental problems, itching, sneezing,  nasal congestion or excess/ purulent secretions, ear ache,   fever, chills, sweats, unintended wt loss, pleuritic or exertional cp, hemoptysis,  orthopnea pnd or leg swelling, presyncope, palpitations, heartburn, abdominal pain, anorexia, nausea, vomiting, diarrhea  or change in bowel or urinary habits, change in stools or urine, dysuria,hematuria,  rash, arthralgias, visual complaints, headache, numbness weakness or ataxia or problems with walking or coordination,  change in mood/affect or memory.           Objective:   Physical Exam   12/08/13           185>   189 12/23/13 > 02/09/2014 186 >186 02/23/2014   HEENT: nl dentition, turbinates, and orophanx. Nl external ear canals without cough reflex   NECK :  without JVD/Nodes/TM/ nl carotid upstrokes bilaterally   LUNGS: CTA , no wheezing    CV:  RRR  no s3 or murmur or increase in P2, no edema   ABD:  soft and nontender with nl excursion in the supine position. No bruits or organomegaly, bowel sounds nl  MS:  warm without deformities, calf  tenderness, cyanosis or clubbing  SKIN: warm and dry without lesions    NEURO:  alert, approp, no deficits    CXR PA and Lateral:   02/09/2014 :  Cardiomegarly/ mild T kyphosis s chf/ effusions   Recent Labs Lab 02/09/14 1618  NA 139  K 4.7  CL 103  CO2 28  BUN 19  CREATININE 1.1  GLUCOSE 115*    Recent Labs Lab 02/09/14 1618  HGB 13.4  HCT 40.1  WBC 6.6  PLT 234.0     Lab Results  Component Value Date   TSH 1.11 02/09/2014     Lab  Results  Component Value Date   PROBNP 328.0* 02/09/2014                 Assessment & Plan:

## 2014-02-23 NOTE — Patient Instructions (Signed)
Follow med calendar closely and bring to each visit Continue current regimen. May use of Allegra 180 mg daily for drainage May add Chlor-Trimeton 4 mg at bedtime as needed. For postnasal drip and throat clearing Follow-up with GI as planned this week Follow up Dr. Melvyn Novas  In 6 weeks and As needed

## 2014-02-24 NOTE — Addendum Note (Signed)
Addended by: Parke Poisson E on: 02/24/2014 01:32 PM   Modules accepted: Orders, Medications

## 2014-02-25 ENCOUNTER — Ambulatory Visit (INDEPENDENT_AMBULATORY_CARE_PROVIDER_SITE_OTHER): Payer: Commercial Managed Care - HMO | Admitting: Nurse Practitioner

## 2014-02-25 ENCOUNTER — Encounter: Payer: Self-pay | Admitting: Nurse Practitioner

## 2014-02-25 VITALS — BP 132/62 | HR 64 | Ht 62.0 in | Wt 184.0 lb

## 2014-02-25 DIAGNOSIS — K219 Gastro-esophageal reflux disease without esophagitis: Secondary | ICD-10-CM

## 2014-02-25 DIAGNOSIS — R062 Wheezing: Secondary | ICD-10-CM

## 2014-02-25 MED ORDER — PANTOPRAZOLE SODIUM 40 MG PO TBEC: 40.0000 mg | DELAYED_RELEASE_TABLET | Freq: Every day | ORAL | Status: DC

## 2014-02-25 NOTE — Patient Instructions (Addendum)
Today you have been given information on GERD to read and follow.  We have sent the following medications to your pharmacy for you to pick up at your convenience: Pantoprazole sent in for #30 and 5 refills.  Increase your pantoprazole to twice a day, take one 30 minutes before breakfast and supper daily.  Continue your famotidine daily at bedtime.   Follow up with [place] as needed.   I appreciate the opportunity to care for you.

## 2014-02-25 NOTE — Progress Notes (Addendum)
HPI :  Patient is an 80 year old female known to Dr. Olevia Perches. She is referred by PCP for evaluation of GERD. Patient has been undergoing pulmonary evaluation of dyspnea /cough. Patient gives a history of severe GERD. She was started on a PPI couple of months back and her pyrosis and reflux have significantly improved. Patient tells me she continues to be short of breath and had problems with a nonproductive cough throughout the day. Recent esophagram shows a hiatal hernia with extensive gastroesophageal reflux barium tablet passed without difficulty, no stricture seen  Past Medical History  Diagnosis Date  . Other left bundle branch block   . Hypertension   . Palpitations   . Mitral valve disorders   . Hyperlipidemia   . URI (upper respiratory infection)   . Hypothyroidism   . Coronary artery disease   . CHF (congestive heart failure)   . Hearing loss of both ears   . GERD (gastroesophageal reflux disease)   . Hiatal hernia   . Diverticulosis   . Hepatic cyst   . Colon polyps     Family History  Problem Relation Age of Onset  . Coronary artery disease      family hx of 1st degree relative <50  . Diabetes      family hx of  . Other      cardiovascular disorder family hx of  . Other      neurological disorder family hx of  . Other      respiratory disease family hx of  . Heart attack Daughter   . Heart Problems      all children  . Sudden death Son   . Heart disease Brother   . Heart disease Sister   . Colon cancer Neg Hx   . Colon polyps Neg Hx    History  Substance Use Topics  . Smoking status: Never Smoker   . Smokeless tobacco: Never Used  . Alcohol Use: No   Current Outpatient Prescriptions  Medication Sig Dispense Refill  . aspirin 81 MG tablet Take 81 mg by mouth daily.    . bisoprolol (ZEBETA) 10 MG tablet Take 1 tablet (10 mg total) by mouth daily. 90 tablet 0  . Calcium Citrate (CITRACAL PO) Take 1 tablet by mouth daily.    . chlorpheniramine  (CHLOR-TRIMETON) 4 MG tablet Take 4 mg by mouth at bedtime as needed (drainage, drippy nose, throat clearing).    Marland Kitchen dextromethorphan (DELSYM) 30 MG/5ML liquid 2 tsp twice daily as needed for cough    . famotidine (PEPCID) 20 MG tablet Take 1 tablet (20 mg total) by mouth at bedtime. 30 tablet 5  . furosemide (LASIX) 20 MG tablet Take 1 tablet (20 mg total) by mouth daily. 90 tablet 1  . levothyroxine (SYNTHROID, LEVOTHROID) 100 MCG tablet Take 1 tablet (100 mcg total) by mouth daily. 90 tablet 3  . loratadine (CLARITIN) 10 MG tablet Take 10 mg by mouth daily.    . Multiple Vitamins-Minerals (MULTIVITAMIN PO) Take by mouth daily.    . Multiple Vitamins-Minerals (PRESERVISION AREDS PO) Take 1 tablet by mouth daily.    . pantoprazole (PROTONIX) 40 MG tablet Take 1 tablet (40 mg total) by mouth daily. 30 minutes before a meal 30 tablet 5  . vitamin C (ASCORBIC ACID) 500 MG tablet Take 500 mg by mouth daily.     No current facility-administered medications for this visit.   Allergies  Allergen Reactions  . Adhesive [Tape] Itching, Dermatitis and  Rash    Blisters and "skin bubbles"  . Atorvastatin     REACTION: Reaction not known  . Clindamycin   . Codeine   . Latex   . Penicillins   . Simvastatin     REACTION: fatigue   Review of Systems: All systems reviewed and negative except where noted in HPI.    Dg Esophagus  02/17/2014   CLINICAL DATA:  Upper airway cough syndrome.  History of GERD  EXAM: ESOPHOGRAM / BARIUM SWALLOW / BARIUM TABLET STUDY  TECHNIQUE: Combined double contrast and single contrast examination performed using effervescent crystals, thick barium liquid, and thin barium liquid. The patient was observed with fluoroscopy swallowing a 52mm barium sulphate tablet.  FLUOROSCOPY TIME:  One Min and 6 seconds  COMPARISON:  None.  FINDINGS: Mild decrease in esophageal motility. No esophageal stricture or mass lesion.  There is a hiatal hernia with extensive gastroesophageal reflux.   Negative for stricture. Barium tablet passed readily into the stomach without delay.  IMPRESSION: Hiatal hernia with extensive gastroesophageal reflux  Negative for stricture.   Electronically Signed   By: Franchot Gallo M.D.   On: 02/17/2014 14:38    Physical Exam: BP 132/62 mmHg  Pulse 64  Ht 5\' 2"  (1.575 m)  Wt 184 lb (83.462 kg)  BMI 33.65 kg/m2 Constitutional: Pleasant,well-developed, white female in no acute distress. HEENT: Normocephalic and atraumatic. Conjunctivae are normal. No scleral icterus. Neck supple.  Cardiovascular: Normal rate, regular rhythm.  Pulmonary/chest: Effort normal. Expiratory wheezes through bilateral upper and lower lung fields.  Abdominal: Soft, nondistended, nontender. Bowel sounds active throughout. There are no masses palpable. No hepatomegaly. Extremities: no edema Lymphadenopathy: No cervical noted. Neurological: Alert and oriented to person place and time. Skin: Skin is warm and dry. No rashes noted. Psychiatric: Normal mood and affect. Behavior is normal.   ASSESSMENT AND PLAN:  23. 80 year old female with long-standing GERD. Pyrosis and reflux significantly improved after initiation of PPI  two months ago.  2. Cough and shortness of breath. Patient has already undergone cardiac evaluation. Most recently she has been undergoing pulmonary evaluation. Etiology of her symptoms undetermined at this point. Patient has a history of GERD and there is question of whether above symptoms secondary to GERD.   Will increase Protonix to twice daily.   Continue H2 blocker at bedtime.   GERD literature given.   She needs pulmonary follow up regarding wheezing. If shortness of breath/cough persists then we could pursue a 24-hour pH / impedence study to correlate pulmonary symptoms with reflux events  Hiatal hernia repair not good option given advanced age  Addendum: Reviewed and agree with management. Jerene Bears, MD

## 2014-02-26 DIAGNOSIS — R062 Wheezing: Secondary | ICD-10-CM | POA: Insufficient documentation

## 2014-03-08 ENCOUNTER — Ambulatory Visit (INDEPENDENT_AMBULATORY_CARE_PROVIDER_SITE_OTHER): Payer: Commercial Managed Care - HMO | Admitting: Cardiology

## 2014-03-08 ENCOUNTER — Encounter: Payer: Self-pay | Admitting: Cardiology

## 2014-03-08 VITALS — BP 142/74 | HR 64 | Ht 62.0 in | Wt 182.4 lb

## 2014-03-08 DIAGNOSIS — R0602 Shortness of breath: Secondary | ICD-10-CM

## 2014-03-08 NOTE — Progress Notes (Signed)
HPI The patient presents for evaluation of dyspnea.  She has a past history of nonobstructive coronary disease with a 70% LAD stenosis in 2003. In 2013 she had an echo which was low normal LV function but mild mitral regurgitation. In November of 2013 she had a stress perfusion study with no evidence of ischemia or infarct. She has had some suggestion of diastolic dysfunction on echo. She might of had some improvement in her breathing with diuretics. She has had pulmonary function tests which demonstrated normal lung volumes and only a mildly reduced diffusion capacity. Chest x-ray was unremarkable.  Recently she has been treated for GERD. She thinks that eating small meals has helped with her dyspnea. She was able to walk further today because she small breakfast.  Her proton was increased to twice a day. She's not had any acute symptoms.  She denies any PND or orthopnea. She denies any chest pressure, neck or arm discomfort.  I did send her for an echo which demonstrates a mildly reduced ejection fraction but no significant pulmonary hypertension.  Of note her BNP recently was normal suggesting that elevated but ventricular end-diastolic pressure or wall stress was not the issue.  Allergies  Allergen Reactions  . Adhesive [Tape] Itching, Dermatitis and Rash    Blisters and "skin bubbles"  . Atorvastatin     REACTION: Reaction not known  . Clindamycin   . Codeine   . Latex   . Penicillins   . Simvastatin     REACTION: fatigue    Current Outpatient Prescriptions  Medication Sig Dispense Refill  . aspirin 81 MG tablet Take 81 mg by mouth daily.    . bisoprolol (ZEBETA) 10 MG tablet Take 1 tablet (10 mg total) by mouth daily. 90 tablet 0  . Calcium Citrate (CITRACAL PO) Take 1 tablet by mouth daily.    . chlorpheniramine (CHLOR-TRIMETON) 4 MG tablet Take 4 mg by mouth at bedtime as needed (drainage, drippy nose, throat clearing).    Marland Kitchen dextromethorphan (DELSYM) 30 MG/5ML liquid 2 tsp  twice daily as needed for cough    . famotidine (PEPCID) 20 MG tablet Take 1 tablet (20 mg total) by mouth at bedtime. 30 tablet 5  . furosemide (LASIX) 20 MG tablet Take 1 tablet (20 mg total) by mouth daily. 90 tablet 1  . levothyroxine (SYNTHROID, LEVOTHROID) 100 MCG tablet Take 1 tablet (100 mcg total) by mouth daily. 90 tablet 3  . loratadine (CLARITIN) 10 MG tablet Take 10 mg by mouth daily.    . Multiple Vitamins-Minerals (MULTIVITAMIN PO) Take by mouth daily.    . Multiple Vitamins-Minerals (PRESERVISION AREDS PO) Take 1 tablet by mouth daily.    . vitamin C (ASCORBIC ACID) 500 MG tablet Take 500 mg by mouth daily.     No current facility-administered medications for this visit.    Past Medical History  Diagnosis Date  . Other left bundle branch block   . Hypertension   . Palpitations   . Mitral valve disorders   . Dyspnea   . Hyperlipidemia   . URI (upper respiratory infection)   . Low back pain   . Hypothyroidism   . Coronary artery disease   . CHF (congestive heart failure)   . Hearing loss of both ears   . GERD (gastroesophageal reflux disease)   . Hiatal hernia   . Diverticulosis   . Hepatic cyst   . Colon polyps     Past Surgical History  Procedure Laterality  Date  . Colonoscopy    . Cb    . Cb    . Total abdominal hysterectomy w/ bilateral salpingoophorectomy    . Tonsillectomy and adenoidectomy    . Thyroidectomy    . Cardiac catheterization  04-18-01  . Hernia repair      ROS:  As stated in the HPI and negative for all other systems.  PHYSICAL EXAM BP 142/74 mmHg  Pulse 64  Ht 5\' 2"  (1.575 m)  Wt 182 lb 6.4 oz (82.736 kg)  BMI 33.35 kg/m2 GENERAL:  Well appearing NECK:  No jugular venous distention, waveform within normal limits, carotid upstroke brisk and symmetric, no bruits, no thyromegaly LUNGS:  Clear to auscultation bilaterally BACK:  No CVA tenderness CHEST:  Unremarkable HEART:  PMI not displaced or sustained,S1 and S2 within normal  limits, no S3, no S4, no clicks, no rubs, no murmurs ABD:  Flat, positive bowel sounds normal in frequency in pitch, no bruits, no rebound, no guarding, no midline pulsatile mass, no hepatomegaly, no splenomegaly EXT:  2 plus pulses throughout, trace edema, no cyanosis no clubbing SKIN:  Rash on face   ASSESSMENT AND PLAN   DYSPNEA:  This seems to be improved with reflux treatment. However, she is concerned that the medications might be causing facial rash and she stopped them. I told her to discuss this with her pharmacist and the GI doctors. Given the fact she's had improvement in her symptoms however I am not planning any further cardiovascular testing.  Her Botox was increased to twice a day she currently says she stopped taking this because of the rash.  Of note she would like to participate in cardiac rehabilitation and I will try to make this referral.  HTN:  This seems to be well treated on the bisoprolol. She will continue with this.  EDEMA:  This is only trace.  No change in therapy is indicated.

## 2014-03-08 NOTE — Patient Instructions (Addendum)
Your physician recommends that you schedule a follow-up appointment in: 6 months with Dr. Percival Spanish  We are getting a referral to cardiac rehab

## 2014-04-06 ENCOUNTER — Ambulatory Visit: Payer: Commercial Managed Care - HMO | Admitting: Internal Medicine

## 2014-04-22 ENCOUNTER — Telehealth: Payer: Self-pay | Admitting: Cardiology

## 2014-04-23 NOTE — Telephone Encounter (Signed)
Close encounter 

## 2014-07-20 ENCOUNTER — Telehealth: Payer: Self-pay | Admitting: Cardiology

## 2014-07-20 MED ORDER — BISOPROLOL FUMARATE 10 MG PO TABS: 10.0000 mg | ORAL_TABLET | Freq: Every day | ORAL | Status: DC

## 2014-07-20 NOTE — Telephone Encounter (Signed)
Returned call to patient she stated she would like a 90 day refill for Bisoprolol 10 mg.Refill sent to pharmacy.

## 2014-07-20 NOTE — Telephone Encounter (Signed)
Please call,wants to talk to you about her Bisoprolol.

## 2014-08-30 ENCOUNTER — Other Ambulatory Visit: Payer: Self-pay

## 2014-09-20 ENCOUNTER — Encounter: Payer: Self-pay | Admitting: Adult Health

## 2014-09-20 ENCOUNTER — Ambulatory Visit (INDEPENDENT_AMBULATORY_CARE_PROVIDER_SITE_OTHER): Payer: Commercial Managed Care - HMO | Admitting: Adult Health

## 2014-09-20 ENCOUNTER — Telehealth: Payer: Self-pay | Admitting: Family Medicine

## 2014-09-20 VITALS — BP 108/70 | HR 62 | Temp 98.1°F | Ht 62.0 in | Wt 180.5 lb

## 2014-09-20 DIAGNOSIS — J381 Polyp of vocal cord and larynx: Secondary | ICD-10-CM | POA: Diagnosis not present

## 2014-09-20 DIAGNOSIS — J387 Other diseases of larynx: Secondary | ICD-10-CM

## 2014-09-20 NOTE — Progress Notes (Addendum)
   Subjective:    Patient ID: Beverly Kim, female    DOB: Nov 25, 1924, 81 y.o.   MRN: [id number]  HPI  81 year old female who presents to the office with the complaint of " my voice is getting worse."   She was seen in [facility] [place] at a walk-in clinic for cough and losing her voice. She had an ultrasound done and she was found to have two nodules on her right and 1 nodule on her left side of her" throat".   Per her records from [facility] ( scanned into her file).   US soft tissue neck revealed ' 1.Potentially abnormal node seen at level 4 on the right.  2. Small heterogeneous appearance of the thyroid gland with a single small nodule on the right. No dominant thyroid nodule seen.'  Per CT  1. Thyroid tissue is not visualized CT imaging. Findings may indicate hypothyroidism 2. Multiple bilateral parotid gland nodules. These would most commonly represent either Warthin's tumors, lymph nodes, or pleomorphic adenomas.   She needs to be referred to ENT in [facility]  Review of Systems  Constitutional: Negative.   HENT: Positive for voice change. Negative for congestion, ear discharge, ear pain, rhinorrhea and sinus pressure.   Respiratory: Positive for cough.   Cardiovascular: Negative.   Gastrointestinal: Negative.   Neurological: Negative.   Hematological: Negative.   All other systems reviewed and are negative.      Objective:   Physical Exam  Constitutional: She is oriented to person, place, and time. She appears well-developed and well-nourished. No distress.  HENT:  Head: Normocephalic and atraumatic.  Right Ear: External ear normal.  Left Ear: External ear normal.  Nose: Nose normal.  Mouth/Throat: Oropharynx is clear and moist.  Hoarseness noted  Eyes: Conjunctivae and EOM are normal. Pupils are equal, round, and reactive to light. Right eye exhibits no discharge. Left eye exhibits no discharge.  Neck: Normal range of motion. Neck supple.  Cardiovascular:  Normal rate, regular rhythm, normal heart sounds and intact distal pulses.  Exam reveals no gallop and no friction rub.   No murmur heard. Pulmonary/Chest: Effort normal and breath sounds normal. No respiratory distress. She has no wheezes. She has no rales. She exhibits no tenderness.  Musculoskeletal: Normal range of motion.  Normal gait. Able to get out of chair and onto exam table without difficulty.   Neurological: She is alert and oriented to person, place, and time.  Skin: Skin is warm and dry. No rash noted. She is not diaphoretic. No erythema. No pallor.  Psychiatric: She has a normal mood and affect. Her behavior is normal. Judgment and thought content normal.  Nursing note and vitals reviewed.     Assessment & Plan:  1. Laryngeal nodule - Ambulatory referral to ENT - Follow up as needed - Will attempt to get records from [facility].

## 2014-09-20 NOTE — Patient Instructions (Signed)
It was a pleasure meeting you today.   I have placed the referral for you and someone will call you to set up an appointment.   Please let me know if you need anything in the meantime.   Have a great birthday!

## 2014-09-20 NOTE — Addendum Note (Signed)
Addended by: Colleen Can on: 09/20/2014 04:46 PM   Modules accepted: Orders

## 2014-09-20 NOTE — Telephone Encounter (Signed)
The name of place [facility] [phone] and ask for ruth if possible

## 2014-10-05 ENCOUNTER — Other Ambulatory Visit: Payer: Self-pay | Admitting: Cardiology

## 2015-01-03 ENCOUNTER — Encounter: Payer: Self-pay | Admitting: Family Medicine

## 2015-01-03 ENCOUNTER — Ambulatory Visit (INDEPENDENT_AMBULATORY_CARE_PROVIDER_SITE_OTHER): Payer: Commercial Managed Care - HMO | Admitting: Family Medicine

## 2015-01-03 VITALS — BP 114/74 | Temp 98.1°F | Wt 180.0 lb

## 2015-01-03 DIAGNOSIS — M7502 Adhesive capsulitis of left shoulder: Secondary | ICD-10-CM

## 2015-01-03 DIAGNOSIS — M7500 Adhesive capsulitis of unspecified shoulder: Secondary | ICD-10-CM | POA: Insufficient documentation

## 2015-01-03 NOTE — Progress Notes (Signed)
   Subjective:    Patient ID: Beverly Kim, female    DOB: 1924-03-06, 81 y.o.   MRN: [id number]  HPI Abe People is a 81 year old female who comes in today for evaluation of soreness in her left shoulder for 3 months plus  She states she fell in June when she was on a trip to [place]. Over the last couple months her shoulder has become more stiff and now she can hardly raise it   Review of Systems    review of systems otherwise negative Objective:   Physical Exam  Well-developed well-nourished female no acute distress vital signs stable she's afebrile the left shoulder in the sitting position is only able to be abducted about 10.      Assessment & Plan:  Frozen shoulder left............ advised to go back and see an orthopedist for further evaluation ASAP

## 2015-01-03 NOTE — Progress Notes (Signed)
Pre visit review using our clinic review tool, if applicable. No additional management support is needed unless otherwise documented below in the visit note. 

## 2015-01-03 NOTE — Patient Instructions (Signed)
Call your orthopedist and asked to be seen ASAP,,,,,,,,,,, you have a condition called a frozen shoulder  Almyra Free........... is our new adult Designer, jewellery........... please set up an appointment sometime this winter to see her and get established

## 2015-01-24 ENCOUNTER — Other Ambulatory Visit: Payer: Self-pay | Admitting: Family Medicine

## 2015-01-25 ENCOUNTER — Ambulatory Visit (INDEPENDENT_AMBULATORY_CARE_PROVIDER_SITE_OTHER): Payer: Commercial Managed Care - HMO | Admitting: Cardiology

## 2015-01-25 ENCOUNTER — Encounter: Payer: Self-pay | Admitting: Cardiology

## 2015-01-25 ENCOUNTER — Telehealth: Payer: Self-pay

## 2015-01-25 VITALS — BP 158/72 | HR 71 | Ht 62.0 in | Wt 181.0 lb

## 2015-01-25 DIAGNOSIS — I10 Essential (primary) hypertension: Secondary | ICD-10-CM

## 2015-01-25 MED ORDER — BISOPROLOL FUMARATE 10 MG PO TABS: 10.0000 mg | ORAL_TABLET | Freq: Every day | ORAL | Status: DC

## 2015-01-25 MED ORDER — FUROSEMIDE 20 MG PO TABS: 20.0000 mg | ORAL_TABLET | Freq: Every day | ORAL | Status: DC

## 2015-01-25 NOTE — Telephone Encounter (Signed)
Pt's pharmacy is requesting refills of Pantoprazole. Patient needs office visit. Left vm for pt to call back schedule office visit to get refills.

## 2015-01-25 NOTE — Progress Notes (Signed)
HPI The patient presents for evaluation of dyspnea.  She has a past history of nonobstructive coronary disease with a 70% LAD stenosis in 2003. In 2013 she had an echo which was low normal LV function but mild mitral regurgitation. In November of 2013 she had a stress perfusion study with no evidence of ischemia or infarct. She has had some suggestion of diastolic dysfunction on echo. She has had pulmonary function tests which demonstrated normal lung volumes and only a mildly reduced diffusion capacity.  She is being treated for GERD.   Since I last saw her she has had no change in her symptoms. She does get still some shortness of breath like she needs to take a deep breath. However, this pattern is unchanged. She has any PND or orthopnea. She's had no weight gain or edema. She doesn't have chest pain. She is active and drives to [place] where her [place] farm [place] the [place]  Allergies  Allergen Reactions  . Adhesive [Tape] Itching, Dermatitis and Rash    Blisters and "skin bubbles"  . Atorvastatin     REACTION: Reaction not known  . Clindamycin   . Codeine   . Latex   . Penicillins   . Simvastatin     REACTION: fatigue    Current Outpatient Prescriptions  Medication Sig Dispense Refill  . bisoprolol (ZEBETA) 10 MG tablet Take 1 tablet (10 mg total) by mouth daily. 90 tablet 3  . chlorpheniramine (CHLOR-TRIMETON) 4 MG tablet Take 4 mg by mouth at bedtime as needed (drainage, drippy nose, throat clearing).    Marland Kitchen dextromethorphan (DELSYM) 30 MG/5ML liquid 2 tsp twice daily as needed for cough    . furosemide (LASIX) 20 MG tablet TAKE ONE TABLET BY MOUTH ONCE DAILY 90 tablet 0  . levothyroxine (SYNTHROID, LEVOTHROID) 100 MCG tablet TAKE ONE TABLET BY MOUTH ONCE DAILY 90 tablet 0  . loratadine (CLARITIN) 10 MG tablet Take 10 mg by mouth daily.    . pantoprazole (PROTONIX) 40 MG tablet Take 40 mg by mouth 2 (two) times daily.    . vitamin C  (ASCORBIC ACID) 500 MG tablet Take 500 mg by mouth daily.     No current facility-administered medications for this visit.    Past Medical History  Diagnosis Date  . Other left bundle branch block   . Hypertension   . Palpitations   . Mitral valve disorders   . Dyspnea   . Hyperlipidemia   . URI (upper respiratory infection)   . Low back pain   . Hypothyroidism   . Coronary artery disease   . CHF (congestive heart failure) ([place])   . Hearing loss of both ears   . GERD (gastroesophageal reflux disease)   . Hiatal hernia   . Diverticulosis   . Hepatic cyst   . Colon polyps     Past Surgical History  Procedure Laterality Date  . Colonoscopy    . Cb    . Cb    . Total abdominal hysterectomy w/ bilateral salpingoophorectomy    . Tonsillectomy and adenoidectomy    . Thyroidectomy    . Cardiac catheterization  04-18-01  . Hernia repair      ROS:  As stated in the HPI and negative for all other systems.  PHYSICAL EXAM BP 158/72 mmHg  Pulse 71  Ht 5\' 2"  (1.575 m)  Wt 181 lb (82.101 kg)  BMI 33.10 kg/m2 GENERAL:  Well appearing  NECK:  No jugular venous distention, waveform within normal limits, carotid upstroke brisk and symmetric, no bruits, no thyromegaly LUNGS:  Clear to auscultation bilaterally BACK:  No CVA tenderness CHEST:  Unremarkable HEART:  PMI not displaced or sustained,S1 and S2 within normal limits, no S3, no S4, no clicks, no rubs, no murmurs ABD:  Flat, positive bowel sounds normal in frequency in pitch, no bruits, no rebound, no guarding, no midline pulsatile mass, no hepatomegaly, no splenomegaly EXT:  2 plus pulses throughout, trace edema, no cyanosis no clubbing SKIN:  Rash on face   EKG:  NSR, rate 71, LBBB.  No change.  01/25/2015   ASSESSMENT AND PLAN   DYSPNEA:  This is baseline. No change in therapy is indicated. No further workup is indicated.  HTN:  This seems to be well treated on the bisoprolol. It is a little bit elevated today but it  fluctuates and is usually well controlled. No change in therapy is indicated.

## 2015-01-25 NOTE — Patient Instructions (Signed)
Your physician wants you to follow-up in: 1 Year You will receive a reminder letter in the mail two months in advance. If you don't receive a letter, please call our office to schedule the follow-up appointment.  If you need a refill on your cardiac medications before your next appointment, please call your pharmacy.      HAPPY THANKSGIVING!!!!

## 2015-04-06 ENCOUNTER — Ambulatory Visit (INDEPENDENT_AMBULATORY_CARE_PROVIDER_SITE_OTHER): Payer: Medicare Other | Admitting: Family Medicine

## 2015-04-06 ENCOUNTER — Encounter: Payer: Self-pay | Admitting: Family Medicine

## 2015-04-06 VITALS — BP 110/80 | Temp 97.8°F | Wt 182.0 lb

## 2015-04-06 DIAGNOSIS — M25512 Pain in left shoulder: Secondary | ICD-10-CM

## 2015-04-06 DIAGNOSIS — D229 Melanocytic nevi, unspecified: Secondary | ICD-10-CM

## 2015-04-06 NOTE — Progress Notes (Signed)
Pre visit review using our clinic review tool, if applicable. No additional management support is needed unless otherwise documented below in the visit note. 

## 2015-04-06 NOTE — Patient Instructions (Signed)
Call [facility] orthopedics and asked to be seen by Dr. Lennette Bihari supple    establish at [facility] with one of the new physicians ASAP........... [phone]

## 2015-04-06 NOTE — Progress Notes (Signed)
   Subjective:    Patient ID: Beverly Kim, female    DOB: 1924-11-13, 82 y.o.   MRN: [id number]  HPI  Beverly Kim is an 82 year old female nonsmoker who comes in today for evaluation 2 problems  She states that over the Christmas holiday her family noticed a dark lesion on the anterior portion of her chest and wanted that evaluated  She tells me she went to an orthopedist had her shoulder injected. She was told she had bone rubbing on bone. There was not a frozen shoulder like she's had in the past. She's not getting any improvement. She would like another evaluation   Review of Systems     Review of systems otherwise negative,,,,,,, except she wishes to establish with one of the new physicians at [facility] which is closer to her home Objective:   Physical Exam   well-developed well-nourished female no acute distress vital signs stable she's afebrile examination skin the anterior chest wall shows some freckles moles some hyperpigmented spots but nothing abnormal      Assessment & Plan:   normal skin exam.........Marland Kitchen Reassured  Left shoulder pain..........Marland Kitchen Refer to Dr. supple

## 2015-04-20 DIAGNOSIS — M19012 Primary osteoarthritis, left shoulder: Secondary | ICD-10-CM | POA: Diagnosis not present

## 2015-04-26 ENCOUNTER — Other Ambulatory Visit: Payer: Self-pay | Admitting: Family Medicine

## 2015-06-03 ENCOUNTER — Encounter: Payer: Self-pay | Admitting: Internal Medicine

## 2015-06-23 ENCOUNTER — Encounter: Payer: Self-pay | Admitting: Internal Medicine

## 2015-06-23 ENCOUNTER — Ambulatory Visit (INDEPENDENT_AMBULATORY_CARE_PROVIDER_SITE_OTHER): Payer: Medicare Other | Admitting: Internal Medicine

## 2015-06-23 VITALS — BP 130/60 | HR 66 | Temp 97.7°F | Resp 16 | Wt 182.0 lb

## 2015-06-23 DIAGNOSIS — K219 Gastro-esophageal reflux disease without esophagitis: Secondary | ICD-10-CM

## 2015-06-23 DIAGNOSIS — E785 Hyperlipidemia, unspecified: Secondary | ICD-10-CM

## 2015-06-23 DIAGNOSIS — I2510 Atherosclerotic heart disease of native coronary artery without angina pectoris: Secondary | ICD-10-CM

## 2015-06-23 DIAGNOSIS — E038 Other specified hypothyroidism: Secondary | ICD-10-CM

## 2015-06-23 DIAGNOSIS — Z0000 Encounter for general adult medical examination without abnormal findings: Secondary | ICD-10-CM | POA: Diagnosis not present

## 2015-06-23 DIAGNOSIS — I10 Essential (primary) hypertension: Secondary | ICD-10-CM

## 2015-06-23 MED ORDER — PANTOPRAZOLE SODIUM 20 MG PO TBEC: 20.0000 mg | DELAYED_RELEASE_TABLET | Freq: Every day | ORAL | Status: DC

## 2015-06-23 NOTE — Progress Notes (Signed)
Pre visit review using our clinic review tool, if applicable. No additional management support is needed unless otherwise documented below in the visit note. 

## 2015-06-23 NOTE — Assessment & Plan Note (Signed)
BP well controlled Current regimen effective and well tolerated Continue current medications at current doses cmp  

## 2015-06-23 NOTE — Assessment & Plan Note (Signed)
Following with cardiology Low exercise tolerance, which may be deconditioning, obesity or partially related to coronary artery disease, heart failure Encouraged her to try increasing her activity slowly 9 continue current medications

## 2015-06-23 NOTE — Assessment & Plan Note (Signed)
Fairly compliant with a GERD diet GERD currently not controlled Restart pantoprazole and 20 mg daily-advised her to let me know if her symptoms are not controlled-we'll need to increase medication

## 2015-06-23 NOTE — Progress Notes (Signed)
Subjective:    Patient ID: Beverly Kim, female    DOB: [birth year], 82 y.o.   MRN: [id number]  HPI She is here to establish with a new pcp.    Left shoulder pain:  She fell and injured her left shoulder last June.  She was diagnosed with a frozen shoulder and arthritis.  The posterior left upper arm is very painful and she does not sleep well due to the pain. She feels the arm/shoulder is becoming atrophied.  She has decreased range of motion of the shoulder.  She has not done PT.    GERD, throat feels swollen, jaw area feels swollen at times.  She modified her diet and does not eat late in the evening.  Her throat has improved and her reflux has improved.  She was on medication, but stopped it.  She does get burning or stinging sensation near her left breast or in her throat frequently and can tell that her voice is not normal.    Her hair is falling out.  She wonders if that is related to her thyroid.   Her balance is not good. She is very careful and has only fallen once in the past year.  She feels a little unsteady and wants to be more steady on her feet. She is not currently exercising.      Here for medicare wellness exam.   I have personally reviewed and have noted 1.The patient's medical and social history 2.Their use of alcohol, tobacco or illicit drugs 3.Their current medications and supplements 4.The patient's functional ability including ADL's, fall risks, home safety risks and hearing or visual impairment. 5.Diet and physical activities 6.Evidence for depression or mood disorders    Are there smokers in your home (other than you)? No, lives alone  Risk Factors Exercise: none Dietary issues discussed: eats at least 2 fruits a day, 2 veges a day, eats more than she should, but healthy diet  Cardiac risk factors: advanced age (older than 46 for men, 58 for women), hypertension, sedentary and obesity (BMI >= 30  kg/m2).  Depression Screen  Have you felt down, depressed or hopeless? No  Have you felt little interest or pleasure in doing things?  No Activities of Daily Living In your present state of health, do you have any difficulty performing the following activities?:  Driving? No - still drives Managing money?  No Feeding yourself? No Getting from bed to chair? No Climbing a flight of stairs? No Preparing food and eating?: No Bathing or showering? No Getting dressed: No Getting to/using the toilet? No Moving around from place to place: No In the past year have you fallen or had a near fall?: yes, last June injured her left shoulder  - slipped after losing her balance after reaching for something   Are you sexually active?  No  Do you have more than one partner?  N/A  Hearing Difficulties: yes - wears hearing aids Do you often ask people to speak up or repeat themselves? yes Do you experience ringing or noises in your ears? No Do you have difficulty understanding soft or whispered voices? yes Vision:              Any change in vision: no             Up to date with eye exam: yes Memory:  Do you feel that you have a problem with memory? No  Do you often misplace items? No  Do  you feel safe at home?  Yes  Cognitive Testing  Alert, Orientated? Yes  Normal Appearance? Yes  Recall of three objects?  Yes  Can perform simple calculations? Yes  Displays appropriate judgment? Yes  Can read the correct time from a watch face? Yes   Advanced Directives have been discussed with the patient? Yes   Medications and allergies reviewed with patient and updated if appropriate.  Patient Active Problem List   Diagnosis Date Noted  . Left shoulder pain 04/06/2015  . Frozen shoulder 01/03/2015  . Wheezing 02/26/2014  . Cough variant asthma 12/08/2013  . Upper airway cough syndrome 10/27/2013  . Dyspnea 02/03/2013  . Cough due to ACE inhibitor 03/25/2012  . Benign mole 03/25/2012  . GERD  07/06/2009  . HIATAL HERNIA 07/06/2009  . DIVERTICULOSIS, COLON 07/06/2009  . HEPATIC CYST 07/06/2009  . COLONIC POLYPS, HX OF 07/06/2009  . MIXED HEARING LOSS BILATERAL 12/13/2008  . Essential hypertension 06/19/2008  . MITRAL VALVE PROLAPSE 06/19/2008  . LEFT BUNDLE BRANCH BLOCK 06/19/2008  . HYPERLIPIDEMIA 08/12/2007  . HYPOTHYROIDISM 12/02/2006  . CORONARY ARTERY DISEASE 12/02/2006  . Chronic diastolic heart failure ([place]) 12/02/2006    Current Outpatient Prescriptions on File Prior to Visit  Medication Sig Dispense Refill  . bisoprolol (ZEBETA) 10 MG tablet Take 1 tablet (10 mg total) by mouth daily. 90 tablet 3  . chlorpheniramine (CHLOR-TRIMETON) 4 MG tablet Take 4 mg by mouth at bedtime as needed (drainage, drippy nose, throat clearing).    Marland Kitchen dextromethorphan (DELSYM) 30 MG/5ML liquid 2 tsp twice daily as needed for cough    . furosemide (LASIX) 20 MG tablet Take 1 tablet (20 mg total) by mouth daily. 90 tablet 3  . levothyroxine (SYNTHROID, LEVOTHROID) 100 MCG tablet TAKE ONE TABLET BY MOUTH ONCE DAILY 90 tablet 0  . vitamin C (ASCORBIC ACID) 500 MG tablet Take 500 mg by mouth daily.     No current facility-administered medications on file prior to visit.    Past Medical History  Diagnosis Date  . Other left bundle branch block   . Hypertension   . Palpitations   . Mitral valve disorders   . Dyspnea   . Hyperlipidemia   . URI (upper respiratory infection)   . Low back pain   . Hypothyroidism   . Coronary artery disease   . CHF (congestive heart failure) ([place])   . Hearing loss of both ears   . GERD (gastroesophageal reflux disease)   . Hiatal hernia   . Diverticulosis   . Hepatic cyst   . Colon polyps     Past Surgical History  Procedure Laterality Date  . Colonoscopy    . Total abdominal hysterectomy w/ bilateral salpingoophorectomy    . Tonsillectomy and adenoidectomy    . Thyroidectomy    . Cardiac catheterization  04-18-01  . Hernia repair       Social History   Social History  . Marital Status: Widowed    Spouse Name: N/A  . Number of Children: 4  . Years of Education: N/A   Occupational History  . retired    Social History Main Topics  . Smoking status: Never Smoker   . Smokeless tobacco: Never Used  . Alcohol Use: No  . Drug Use: No  . Sexual Activity: Not Asked   Other Topics Concern  . None   Social History Narrative    Family History  Problem Relation Age of Onset  . Coronary artery disease  family hx of 1st degree relative <50  . Diabetes      family hx of  . Other      cardiovascular disorder family hx of  . Other      neurological disorder family hx of  . Other      respiratory disease family hx of  . Heart attack Daughter   . Heart Problems      all children  . Sudden death Son   . Heart disease Brother   . Heart disease Sister   . Colon cancer Neg Hx   . Colon polyps Neg Hx     Review of Systems  Constitutional: Negative for fever and appetite change.  HENT: Positive for hearing loss. Negative for tinnitus.   Eyes: Negative for visual disturbance.  Respiratory: Positive for cough (occasional) and shortness of breath (with exertion). Negative for wheezing.   Cardiovascular: Positive for leg swelling (mild on left). Negative for chest pain and palpitations.  Gastrointestinal: Negative for abdominal pain, diarrhea, constipation and blood in stool.       Gerd  Genitourinary: Negative for dysuria and hematuria.  Musculoskeletal: Positive for arthralgias.  Neurological: Positive for light-headedness (occasional) and headaches (occasional).  Psychiatric/Behavioral: Negative for dysphoric mood. The patient is not nervous/anxious.        Objective:   Filed Vitals:   06/23/15 0952  BP: 130/60  Pulse: 66  Temp: 97.7 F (36.5 C)  Resp: 16   Filed Weights   06/23/15 0952  Weight: 182 lb (82.555 kg)   Body mass index is 33.28 kg/(m^2).   Physical Exam Constitutional: She  appears well-developed and well-nourished. No distress.  HENT:  Head: Normocephalic and atraumatic.  Right Ear: External ear normal. Normal ear canal and TM Left Ear: External ear normal.  Normal ear canal and TM Mouth/Throat: Oropharynx is clear and moist.  Eyes: Conjunctivae and EOM are normal.  Neck: Neck supple. No tracheal deviation present. No thyromegaly present.  No carotid bruit  Cardiovascular: Normal rate, regular rhythm and normal heart sounds.   2/6 systolic murmur heard.  trace edema. Pulmonary/Chest: Effort normal and breath sounds normal. No respiratory distress. She has no wheezes. She has no rales.  Breast: deferred by patient Abdominal: Soft. Obese. She exhibits no distension. There is mild tenderness near umbilicus that she was not aware that was present.  Lymphadenopathy: She has no cervical adenopathy.  Musculoskeletal: Decreased range of motion left shoulder, full range of motion right shoulder Skin: Skin is warm and dry. She is not diaphoretic.  Psychiatric: She has a normal mood and affect. Her behavior is normal.         Assessment & Plan:   Wellness Exam: Immunizations - discussed Td - not covered by insurance Colonoscopy - no longer needed at her age 68 - no longer needed at her age dexa - deferred today Gyn - no longer needed at her age Eye exam - up to date, vision is better than ever Hearing loss - yes, wears hearing aids Memory concerns/difficulties - none Independent of ADLs - fully independent  Physical exam: Screening blood work ordered Immunizations - discussed Td - not covered by insurance Colonoscopy - no longer needed at her age 69 - no longer needed at her age 46 - no longer needed at her age Dexa-she deferred Eye exams-up-to-date EKG - done by cardiology Exercise-not currently exercising. Stressed the importance of regular exercise and ideally balance exercises. She could join Silver sneakers and I encouraged her to  look into that [facility] ideally work on weight loss. Decreased portions and increase activity Skin -no concerns Substance abuse  -none  Left shoulder pain and decreased range of motion Was told in the past she had a frozen shoulder pain and arthritis We'll have her see Dr. Tamala [name] for further evaluation and treatment  See Problem List for Assessment and Plan of chronic medical problems.  Follow-up in 6 months, sooner if needed

## 2015-06-23 NOTE — Patient Instructions (Addendum)
  Beverly Kim , Thank you for taking time to come for your Medicare Wellness Visit. I appreciate your ongoing commitment to your health goals. Please review the following plan we discussed and let me know if I can assist you in the future.   These are the goals we discussed: Goals    Work on Control and instrumentation engineer.  Start balance exercises      This is a list of the screening recommended for you and due dates:  Health Maintenance  Topic Date Due  . Tetanus Vaccine  03/05/2013  . Pneumonia vaccines (2 of 2 - PCV13) 07/20/2015*  . DEXA scan (bone density measurement)  12/26/2015*  . Flu Shot  10/04/2015  . Shingles Vaccine  Completed  *Topic was postponed. The date shown is not the original due date.    Test(s) ordered today. Your results will be released to [place] (or called to you) after review, usually within 72hours after test completion. If any changes need to be made, you will be notified at that same time.  All other Health Maintenance issues reviewed.   All recommended immunizations and age-appropriate screenings are up-to-date or discussed.  No immunizations administered today.   Medications reviewed and updated.  Changes include restarting the pantoprazole at a low dose of 20 mg daily  Your prescription(s) have been submitted to your pharmacy. Please take as directed and contact our office if you believe you are having problem(s) with the medication(s).   Please followup in 6 months

## 2015-06-23 NOTE — Assessment & Plan Note (Signed)
Check tsh  Titrate med dose if needed  

## 2015-06-24 ENCOUNTER — Other Ambulatory Visit: Payer: Self-pay | Admitting: Family Medicine

## 2015-06-28 ENCOUNTER — Other Ambulatory Visit (INDEPENDENT_AMBULATORY_CARE_PROVIDER_SITE_OTHER): Payer: Medicare Other

## 2015-06-28 DIAGNOSIS — E038 Other specified hypothyroidism: Secondary | ICD-10-CM | POA: Diagnosis not present

## 2015-06-28 DIAGNOSIS — E785 Hyperlipidemia, unspecified: Secondary | ICD-10-CM | POA: Diagnosis not present

## 2015-06-28 DIAGNOSIS — I10 Essential (primary) hypertension: Secondary | ICD-10-CM

## 2015-06-28 DIAGNOSIS — Z0000 Encounter for general adult medical examination without abnormal findings: Secondary | ICD-10-CM

## 2015-06-28 LAB — CBC WITH DIFFERENTIAL/PLATELET
BASOS PCT: 0.2 % (ref 0.0–3.0)
Basophils Absolute: 0 10*3/uL (ref 0.0–0.1)
EOS PCT: 2 % (ref 0.0–5.0)
Eosinophils Absolute: 0.2 10*3/uL (ref 0.0–0.7)
HCT: 42.1 % (ref 36.0–46.0)
Hemoglobin: 14.3 g/dL (ref 12.0–15.0)
LYMPHS ABS: 2.8 10*3/uL (ref 0.7–4.0)
Lymphocytes Relative: 35.5 % (ref 12.0–46.0)
MCHC: 33.8 g/dL (ref 30.0–36.0)
MCV: 92.9 fl (ref 78.0–100.0)
MONO ABS: 0.7 10*3/uL (ref 0.1–1.0)
Monocytes Relative: 8.4 % (ref 3.0–12.0)
NEUTROS ABS: 4.3 10*3/uL (ref 1.4–7.7)
NEUTROS PCT: 53.9 % (ref 43.0–77.0)
PLATELETS: 235 10*3/uL (ref 150.0–400.0)
RBC: 4.53 Mil/uL (ref 3.87–5.11)
RDW: 13.9 % (ref 11.5–15.5)
WBC: 8 10*3/uL (ref 4.0–10.5)

## 2015-06-28 LAB — COMPREHENSIVE METABOLIC PANEL
ALK PHOS: 65 U/L (ref 39–117)
ALT: 19 U/L (ref 0–35)
AST: 21 U/L (ref 0–37)
Albumin: 4.2 g/dL (ref 3.5–5.2)
BUN: 19 mg/dL (ref 6–23)
CHLORIDE: 105 meq/L (ref 96–112)
CO2: 28 meq/L (ref 19–32)
Calcium: 9.9 mg/dL (ref 8.4–10.5)
Creatinine, Ser: 0.91 mg/dL (ref 0.40–1.20)
GFR: 61.9 mL/min (ref >60.00)
GLUCOSE: 120 mg/dL — AB (ref 70–99)
POTASSIUM: 4.9 meq/L (ref 3.5–5.1)
SODIUM: 142 meq/L (ref 135–145)
Total Bilirubin: 1 mg/dL (ref 0.2–1.2)
Total Protein: 7.2 g/dL (ref 6.0–8.3)

## 2015-06-28 LAB — VITAMIN B12: Vitamin B-12: 355 pg/mL (ref 211–911)

## 2015-06-28 LAB — LIPID PANEL
CHOLESTEROL: 218 mg/dL — AB (ref 0–200)
HDL: 42.8 mg/dL (ref >39.00)
LDL CALC: 144 mg/dL — AB (ref 0–99)
NonHDL: 175.13
Total CHOL/HDL Ratio: 5
Triglycerides: 154 mg/dL — ABNORMAL HIGH (ref 0.0–149.0)
VLDL: 30.8 mg/dL (ref 0.0–40.0)

## 2015-06-28 LAB — TSH: TSH: 0.24 u[IU]/mL — AB (ref 0.35–4.50)

## 2015-06-30 ENCOUNTER — Other Ambulatory Visit: Payer: Self-pay | Admitting: Internal Medicine

## 2015-06-30 MED ORDER — LEVOTHYROXINE SODIUM 88 MCG PO TABS: 88.0000 ug | ORAL_TABLET | Freq: Every day | ORAL | Status: DC

## 2015-07-01 ENCOUNTER — Encounter: Payer: Self-pay | Admitting: Emergency Medicine

## 2015-07-08 ENCOUNTER — Telehealth: Payer: Self-pay | Admitting: Internal Medicine

## 2015-07-08 NOTE — Telephone Encounter (Signed)
Pt has questions about her thyroid results .  Can you call her when you get chance ?

## 2015-07-11 NOTE — Telephone Encounter (Signed)
Spoke with pt to clarify.  

## 2015-07-12 ENCOUNTER — Ambulatory Visit (INDEPENDENT_AMBULATORY_CARE_PROVIDER_SITE_OTHER): Payer: Medicare Other | Admitting: Family Medicine

## 2015-07-12 ENCOUNTER — Other Ambulatory Visit: Payer: Self-pay

## 2015-07-12 ENCOUNTER — Encounter: Payer: Self-pay | Admitting: Family Medicine

## 2015-07-12 VITALS — BP 114/82 | HR 62 | Ht 62.0 in | Wt 182.0 lb

## 2015-07-12 DIAGNOSIS — M12512 Traumatic arthropathy, left shoulder: Secondary | ICD-10-CM | POA: Diagnosis not present

## 2015-07-12 DIAGNOSIS — M12812 Other specific arthropathies, not elsewhere classified, left shoulder: Secondary | ICD-10-CM | POA: Insufficient documentation

## 2015-07-12 DIAGNOSIS — M25512 Pain in left shoulder: Secondary | ICD-10-CM | POA: Diagnosis not present

## 2015-07-12 DIAGNOSIS — M75102 Unspecified rotator cuff tear or rupture of left shoulder, not specified as traumatic: Secondary | ICD-10-CM

## 2015-07-12 NOTE — Patient Instructions (Addendum)
I am sorry for the bad news  Ice 20 minutes 2 times daily. Usually after activity and before bed. Avoid any heavy lifting at all times Vitamin D 2000 IU daily  Turmeric 500mg  daily.  May upset stomach so watch for that and if so then stop Glucosamine 1500mg  daily slows progression  Tylenol 500mg  3 times daily scheduled Tart cherry extract any dose at night.  pennsaid pinkie amount topically 2 times daily as needed.  See me gaain in 4-6 weeks to make sure we are making progress.

## 2015-07-12 NOTE — Progress Notes (Signed)
Pre visit review using our clinic review tool, if applicable. No additional management support is needed unless otherwise documented below in the visit note. 

## 2015-07-12 NOTE — Progress Notes (Signed)
[facility] [address], [place] [address] Phone: [phone] Subjective:    I'm seeing this patient by the request  of:  [name], MD  CC: left shoulder pain   [id number] Beverly Kim is a 82 y.o. female coming in with complaint of left shoulder pain. Patient is had this pain for multiple years. Drinks approximately 2 years ago she did have a fall where she attempted to catch herself. Patient started having worsening symptoms since then. Patient has seen other providers and has been told once aforementioned frozen shoulder. Patient states though that it seems to be getting worse. Unable to lift her arm above her head. Pain is unrelenting at night. Cannot do any lifting with this arm. Rates the severity of 9 out of 10. Not responding to any heat over-the-counter medications.     Past Medical History  Diagnosis Date  . Other left bundle branch block   . Hypertension   . Palpitations   . Mitral valve disorders   . Dyspnea   . Hyperlipidemia   . URI (upper respiratory infection)   . Low back pain   . Hypothyroidism   . Coronary artery disease   . CHF (congestive heart failure) ([place])   . Hearing loss of both ears   . GERD (gastroesophageal reflux disease)   . Hiatal hernia   . Diverticulosis   . Hepatic cyst   . Colon polyps    Past Surgical History  Procedure Laterality Date  . Colonoscopy    . Total abdominal hysterectomy w/ bilateral salpingoophorectomy    . Tonsillectomy and adenoidectomy    . Thyroidectomy    . Cardiac catheterization  04-18-01  . Hernia repair     Social History   Social History  . Marital Status: Widowed    Spouse Name: N/A  . Number of Children: 4  . Years of Education: N/A   Occupational History  . retired    Social History Main Topics  . Smoking status: Never Smoker   . Smokeless tobacco: Never Used  . Alcohol Use: No  . Drug Use: No  . Sexual Activity: Not Asked   Other Topics  Concern  . None   Social History Narrative   Allergies  Allergen Reactions  . Adhesive [Tape] Itching, Dermatitis and Rash    Blisters and "skin bubbles"  . Atorvastatin     REACTION: Reaction not known  . Clindamycin   . Codeine   . Latex   . Penicillins   . Simvastatin     REACTION: fatigue   Family History  Problem Relation Age of Onset  . Coronary artery disease      family hx of 1st degree relative <50  . Diabetes      family hx of  . Other      cardiovascular disorder family hx of  . Other      neurological disorder family hx of  . Other      respiratory disease family hx of  . Heart attack Daughter   . Heart Problems      all children  . Sudden death Son   . Heart disease Brother   . Heart disease Sister   . Colon cancer Neg Hx   . Colon polyps Neg Hx     Past medical history, social, surgical and family history all reviewed in electronic medical record.  No pertanent information unless stated regarding to the chief complaint.  Review of Systems: No headache, visual changes, nausea, vomiting, diarrhea, constipation, dizziness, abdominal pain, skin rash, fevers, chills, night sweats, weight loss, swollen lymph nodes, body aches, joint swelling, muscle aches, chest pain, shortness of breath, mood changes.   Objective Blood pressure 114/82, pulse 62, height 5\' 2"  (1.575 m), weight 182 lb (82.555 kg).  General: No apparent distress alert and oriented x3 mood and affect normal, dressed appropriately.  HEENT: Pupils equal, extraocular movements intact  Respiratory: Patient's speak in full sentences and does not appear short of breath  Cardiovascular: No lower extremity edema, non tender, no erythema  Skin: Warm dry intact with no signs of infection or rash on extremities or on axial skeleton.  Abdomen: Soft nontender  Neuro: Cranial nerves II through XII are intact, neurovascularly intact in all extremities with 2+ DTRs and 2+ pulses.  Lymph: No lymphadenopathy  of posterior or anterior cervical chain or axillae bilaterally.  Gait normal with good balance and coordination.  MSK:  Non tender with full range of motion and good stability and symmetric strength and tone of , elbows, wrist, hip, knee and ankles bilaterally. Significant arthritic changes of multiple joints  Shoulder: left InspectiAtrophy of the musculature of the shoulder girdle or less tenderness noted of the shoulder itself passively range of motion is decreased with forward flexion of 95 with severe crepitus and pain with minimal internal and external range of motion. Rotator cuff strength 3 out of 5  signs of impingement with positive Neer and Hawkin's tests, positive  empty can sign. Contralateral shoulder unremarkable   MSK US performed of: left This study was ordered, performed, and interpreted by Charlann Boxer D.O.  Shoulder:   Severe degenerative changes of the rotator cuff with significant atrophy . AC joint:  Severe arthritis Glenohumeral severe narrowing noted  Impression : severe arthritis      Impression and Recommendations:     This case required medical decision making of moderate complexity.      Note: This dictation was prepared with Dragon dictation along with smaller phrase technology. Any transcriptional errors that result from this process are unintentional.

## 2015-07-12 NOTE — Assessment & Plan Note (Signed)
Patient can have severe arthritis. Declined injection today. Discussed with revealing curative measure for the pain would be a shoulder replacement which patient will not do at her age she states. We discussed icing regimen and over-the-counter natural supplementations a could be beneficial. Trial of topical anti-inflammatories given. Avoid any heavy lifting. Follow-up in 4-6 weeks. If not better would encourage patient to try the injection. Do not feel that patient would be able to participate in formal physical therapy secondary to the loss of motion.

## 2015-08-23 ENCOUNTER — Ambulatory Visit (INDEPENDENT_AMBULATORY_CARE_PROVIDER_SITE_OTHER): Payer: Medicare Other | Admitting: Family Medicine

## 2015-08-23 ENCOUNTER — Encounter: Payer: Self-pay | Admitting: Family Medicine

## 2015-08-23 VITALS — BP 118/76 | HR 66 | Wt 183.0 lb

## 2015-08-23 DIAGNOSIS — M5136 Other intervertebral disc degeneration, lumbar region: Secondary | ICD-10-CM | POA: Diagnosis not present

## 2015-08-23 DIAGNOSIS — M12512 Traumatic arthropathy, left shoulder: Secondary | ICD-10-CM | POA: Diagnosis not present

## 2015-08-23 DIAGNOSIS — M75102 Unspecified rotator cuff tear or rupture of left shoulder, not specified as traumatic: Principal | ICD-10-CM

## 2015-08-23 DIAGNOSIS — M5030 Other cervical disc degeneration, unspecified cervical region: Secondary | ICD-10-CM | POA: Diagnosis not present

## 2015-08-23 DIAGNOSIS — M12812 Other specific arthropathies, not elsewhere classified, left shoulder: Secondary | ICD-10-CM

## 2015-08-23 MED ORDER — GABAPENTIN 100 MG PO CAPS: 100.0000 mg | ORAL_CAPSULE | Freq: Every day | ORAL | Status: DC

## 2015-08-23 NOTE — Assessment & Plan Note (Signed)
Moderate to severe osteophytic changes noted of the neck. We will continue to monitor. If worsening radicular symptoms we'll consider increasing the gabapentin but we will start 100 mg first. Encourage her to continue to work on some posture that could be beneficial in the range of motion exercises for the shoulder. Follow-up again in 2 months or sooner if worsening symptoms.

## 2015-08-23 NOTE — Progress Notes (Signed)
Beverly Kim, Beverly Kim Phone: [phone] Subjective:    I'm seeing this patient by the request  of:  [name], MD  CC: left shoulder pain Follow-up  [id number] Beverly Kim is a 82 y.o. female coming in with complaint of left shoulder pain. Doesn't have severe osteophytic changes of the shoulder. Patient elected to try conservative therapy. Has been trying to increase her range of motion recently. States that it seems to be better but still has a dull aching pain in the arm. Patient states that this is much improved Achilles by 50-60%. Is taking the vitamins regularly. States that she is sleeping a little more comfortably as well. Still having some difficulty with certain daily activities but more manageable than what it was previously.  Patient is also complaining of axial skeletal pain. States that from the neck down to her lower back she can have some tightness. Seems to give her sometimes having his to her legs. Reviewing patient's chart showed that in 2000 x-rays that were independently visualized showing moderate to severe facet arthropathy of both the lumbar spine in the cervical spine. Patient also had significant osteophytic changes at L4-L5. Patient states this in the morning she seemed to have some difficulty getting up and going secondary to heaviness in her legs. Denies any bowel or bladder incontinence. Denies any numbness. Would not even consider it a weakness in her legs.    Past Medical History  Diagnosis Date  . Other left bundle branch block   . Hypertension   . Palpitations   . Mitral valve disorders   . Dyspnea   . Hyperlipidemia   . URI (upper respiratory infection)   . Low back pain   . Hypothyroidism   . Coronary artery disease   . CHF (congestive heart failure) ([place])   . Hearing loss of both ears   . GERD (gastroesophageal reflux disease)   . Hiatal hernia   . Diverticulosis   . Hepatic  cyst   . Colon polyps    Past Surgical History  Procedure Laterality Date  . Colonoscopy    . Total abdominal hysterectomy w/ bilateral salpingoophorectomy    . Tonsillectomy and adenoidectomy    . Thyroidectomy    . Cardiac catheterization  04-18-01  . Hernia repair     Social History   Social History  . Marital Status: Widowed    Spouse Name: N/A  . Number of Children: 4  . Years of Education: N/A   Occupational History  . retired    Social History Main Topics  . Smoking status: Never Smoker   . Smokeless tobacco: Never Used  . Alcohol Use: No  . Drug Use: No  . Sexual Activity: Not Asked   Other Topics Concern  . None   Social History Narrative   Allergies  Allergen Reactions  . Adhesive [Tape] Itching, Dermatitis and Rash    Blisters and "skin bubbles"  . Atorvastatin     REACTION: Reaction not known  . Clindamycin   . Codeine   . Latex   . Penicillins   . Simvastatin     REACTION: fatigue   Family History  Problem Relation Age of Onset  . Coronary artery disease      family hx of 1st degree relative <50  . Diabetes      family hx of  . Other      cardiovascular disorder family hx of  .  Other      neurological disorder family hx of  . Other      respiratory disease family hx of  . Heart attack Daughter   . Heart Problems      all children  . Sudden death Son   . Heart disease Brother   . Heart disease Sister   . Colon cancer Neg Hx   . Colon polyps Neg Hx     Past medical history, social, surgical and family history all reviewed in electronic medical record.  No pertanent information unless stated regarding to the chief complaint.   Review of Systems: No headache, visual changes, nausea, vomiting, diarrhea, constipation, dizziness, abdominal pain, skin rash, fevers, chills, chest pain, shortness of breath, mood changes.   Objective Blood pressure 118/76, pulse 66, weight 183 lb (83.008 kg).  General: No apparent distress alert and  oriented x3 mood and affect normal, dressed appropriately.  HEENT: Pupils equal, extraocular movements intact  Respiratory: Patient's speak in full sentences and does not appear short of breath  Cardiovascular: No lower extremity edema, non tender, no erythema  Skin: Warm dry intact with no signs of infection or rash on extremities or on axial skeleton.  Abdomen: Soft nontender  Neuro: Cranial nerves II through XII are intact, neurovascularly intact in all extremities with 2+ DTRs and 2+ pulses.  Lymph: No lymphadenopathy of posterior or anterior cervical chain or axillae bilaterally.  Gait normal with good balance and coordination.  MSK:  Non tender with full range of motion and good stability and symmetric strength and tone of , elbows, wrist, hip, knee and ankles bilaterally. Significant arthritic changes of multiple joints  Neck: Inspection unremarkable. No palpable stepoffs. Negative Spurling's maneuver. Patient only has 5 of extension and 10 side bending bilaterally with only 20 of rotation. Mild crepitus noted Grip strength and sensation normal in bilateral hands Strength good C4 to T1 distribution No sensory change to C4 to T1 Negative Hoffman sign bilaterally Reflexes normal Shoulder: left Inspection reveals atrophy of the musculature of the shoulder girdle Less tenderness than previous exam passively range of motion is decreased with forward flexion of 95 with severe crepitus and pain with minimal internal and external range of motion. Rotator cuff strength 3 out of 5 seems stable Moderate impingement sign still remaining with crepitus noted Contralateral shoulder unremarkable   Back Exam:  Inspection: Unremarkable  Motion: Flexion 30 deg, Extension 15 deg, Side Bending to 25 deg bilaterally,  Rotation to 25 deg bilaterally  SLR laying: Negative  XSLR laying: Negative  Palpable tenderness: Diffuse tenderness of the paraspinal musculature of the lumbar spine FABER:  Unable to do secondary to tightness Sensory change: Gross sensation intact to all lumbar and sacral dermatomes.  Reflexes: 2+ at both patellar tendons, 2+ at achilles tendons, Babinski's downgoing.  Strength at foot  Plantar-flexion: 5/5 Dorsi-flexion: 5/5 Eversion: 5/5 Inversion: 5/5  Leg strength  4-5 strength but symmetric Gait unremarkable.      Impression and Recommendations:     This case required medical decision making of moderate complexity.      Note: This dictation was prepared with Dragon dictation along with smaller phrase technology. Any transcriptional errors that result from this process are unintentional.

## 2015-08-23 NOTE — Assessment & Plan Note (Signed)
Patient doesn't more tender degenerative disc the lumbar spine noted on previous x-rays. Patient's symptoms of some leg heaviness is consistent with some spinal stenosis. We discussed icing regimen. Patient will start on gabapentin. We will continue to monitor. Worsening symptoms repeat imaging would be warranted. Follow-up 2 months

## 2015-08-23 NOTE — Assessment & Plan Note (Signed)
Patient does have more of a rotator cuff arthropathy and likely will have flares from time to time. We discussed with patient and likely the range of motion will not improve significantly. Patient could improve with injections if necessary. Patient went to avoid these as long as possible as well as any significant prescriptions. I do believe that there is some cervical radiculopathy from underlying degenerative disc disease that could be contributing to the continued arm pain and patient will be started on a very low dose of gabapentin. I do not feel that we will need to titrate up in this individual. Encourage her to continue to remain active. We discussed the icing regimen. Follow-up again in 2 months.

## 2015-08-23 NOTE — Patient Instructions (Signed)
Good to see you  Ice still is a good idea when you need it.  Gabapentin 100mg  at night is a prescription to help with the leg heaviness.  Iron 65mg  daily with 500mg  of vitamin C.  If constipation then decrease to 3 times a week.  Increase fish oil to 2 pills daily  Increase calcium to 2 times a day  I love the rest of the vitamins.  I think that is enough changes for now See me again in 2 months.

## 2015-10-24 NOTE — Progress Notes (Signed)
[facility] [address] [place], [place] [address] Phone: [phone] Subjective:    I'm seeing this patient by the request  of:  [name], MD  CC: left shoulder pain Follow-up  [id number]  Beverly Kim is a 82 y.o. female coming in with complaint of left shoulder pain. Doesn't have severe osteophytic changes of the shoulder. Patient elected to try conservative therapy. Patient was doing 50-60% better. Patient was encouraged to continue home exercises in the regular routine so we have discussed previously. Patient states She did not take the gabapentin. Continues to stay active. Patient states that it continues to have pain mostly in the left shoulder. States that the back seems to be a little bit better. States sleeping on the left side gives her significant amount of pain. Some mild radiation down the arm as well. No new symptoms. Some mild worsening of the left shoulder.  Patient was also complaining of axial skeletal pain. States that from the neck down to her lower back she can have some tightness. Seems to give her sometimes having his to her legs. Reviewing patient's chart showed that in 2000 x-rays that were independently visualized showing moderate to severe facet arthropathy of both the lumbar spine in the cervical spine. Patient also had significant osteophytic changes at L4-L5. Patient sent have degenerative disc disease of the cervical and lumbar spine. Patient was to work on Personal assistant, was started on a low dose of gabapentin.  Patient is not taking the gabapentin. Insulin the pain is not very severe. Seems to be the shoulder that is the most debilitating.    Past Medical History:  Diagnosis Date  . CHF (congestive heart failure) ([place])   . Colon polyps   . Coronary artery disease   . Diverticulosis   . Dyspnea   . GERD (gastroesophageal reflux disease)   . Hearing loss of both ears   . Hepatic cyst   . Hiatal hernia   .  Hyperlipidemia   . Hypertension   . Hypothyroidism   . Low back pain   . Mitral valve disorders   . Other left bundle branch block   . Palpitations   . URI (upper respiratory infection)    Past Surgical History:  Procedure Laterality Date  . CARDIAC CATHETERIZATION  04-18-01  . COLONOSCOPY    . HERNIA REPAIR    . THYROIDECTOMY    . TONSILLECTOMY AND ADENOIDECTOMY    . TOTAL ABDOMINAL HYSTERECTOMY W/ BILATERAL SALPINGOOPHORECTOMY     Social History   Social History  . Marital status: Widowed    Spouse name: N/A  . Number of children: 4  . Years of education: N/A   Occupational History  . retired    Social History Main Topics  . Smoking status: Never Smoker  . Smokeless tobacco: Never Used  . Alcohol use No  . Drug use: No  . Sexual activity: Not Asked   Other Topics Concern  . None   Social History Narrative  . None   Allergies  Allergen Reactions  . Adhesive [Tape] Itching, Dermatitis and Rash    Blisters and "skin bubbles"  . Atorvastatin     REACTION: Reaction not known  . Clindamycin   . Codeine   . Latex   . Penicillins   . Simvastatin     REACTION: fatigue   Family History  Problem Relation Age of Onset  . Coronary artery disease      family hx  of 1st degree relative <50  . Diabetes      family hx of  . Other      cardiovascular disorder family hx of  . Other      neurological disorder family hx of  . Other      respiratory disease family hx of  . Heart attack Daughter   . Heart Problems      all children  . Sudden death Son   . Heart disease Brother   . Heart disease Sister   . Colon cancer Neg Hx   . Colon polyps Neg Hx     Past medical history, social, surgical and family history all reviewed in electronic medical record.  No pertanent information unless stated regarding to the chief complaint.   Review of Systems: No headache, visual changes, nausea, vomiting, diarrhea, constipation, dizziness, abdominal pain, skin rash, fevers,  chills, chest pain, shortness of breath, mood changes.   Objective  Blood pressure 122/72, weight 183 lb (83 kg).  General: No apparent distress alert and oriented x3 mood and affect normal, dressed appropriately.  HEENT: Pupils equal, extraocular movements intact  Respiratory: Patient's speak in full sentences and does not appear short of breath  Cardiovascular: No lower extremity edema, non tender, no erythema  Skin: Warm dry intact with no signs of infection or rash on extremities or on axial skeleton.  Abdomen: Soft nontender  Neuro: Cranial nerves II through XII are intact, neurovascularly intact in all extremities with 2+ DTRs and 2+ pulses.  Lymph: No lymphadenopathy of posterior or anterior cervical chain or axillae bilaterally.  Gait normal with good balance and coordination.  MSK:  Non tender with full range of motion and good stability and symmetric strength and tone of , elbows, wrist, hip, knee and ankles bilaterally. Significant arthritic changes of multiple joints  Neck: Inspection unremarkable. No palpable stepoffs. Negative Spurling's maneuver. Patient only has 5 of extension and 10 side bending bilaterally with only 20 of rotation. Mild crepitus noted Grip strength and sensation normal in bilateral hands Strength good C4 to T1 distribution No sensory change to C4 to T1 Negative Hoffman sign bilaterally Reflexes normal Stable from previous exam.  Shoulder: left Inspection reveals atrophy of the musculature of the shoulder girdle Worsening tenderness to even light palpation. passively range of motion is decreased with forward flexion of 95 with severe crepitus and pain with minimal internal and external range of motion. Rotator cuff strength 3 out of 5 Moderate impingement sign still remaining with crepitus noted Contralateral shoulder unremarkable  Mild worsening from previous exam.  Back Exam:  Inspection: Unremarkable  Motion: Flexion 30 deg, Extension 15  deg, Side Bending to 25 deg bilaterally,  Rotation to 25 deg bilaterally  SLR laying: Negative  XSLR laying: Negative  Palpable tenderness: Continued tenderness of the paraspinal musculature of the lumbar spine FABER: Unable to do secondary to tightness Sensory change: Gross sensation intact to all lumbar and sacral dermatomes.  Reflexes: 2+ at both patellar tendons, 2+ at achilles tendons, Babinski's downgoing.  Strength at foot  Plantar-flexion: 5/5 Dorsi-flexion: 5/5 Eversion: 5/5 Inversion: 5/5  Leg strength  4-5 strength but symmetric Gait unremarkable.      Impression and Recommendations:     This case required medical decision making of moderate complexity.      Note: This dictation was prepared with Dragon dictation along with smaller phrase technology. Any transcriptional errors that result from this process are unintentional.

## 2015-10-25 ENCOUNTER — Ambulatory Visit (INDEPENDENT_AMBULATORY_CARE_PROVIDER_SITE_OTHER): Payer: Medicare Other | Admitting: Family Medicine

## 2015-10-25 ENCOUNTER — Encounter: Payer: Self-pay | Admitting: Family Medicine

## 2015-10-25 DIAGNOSIS — M12512 Traumatic arthropathy, left shoulder: Secondary | ICD-10-CM

## 2015-10-25 DIAGNOSIS — M12812 Other specific arthropathies, not elsewhere classified, left shoulder: Secondary | ICD-10-CM

## 2015-10-25 DIAGNOSIS — M5136 Other intervertebral disc degeneration, lumbar region: Secondary | ICD-10-CM

## 2015-10-25 DIAGNOSIS — M75102 Unspecified rotator cuff tear or rupture of left shoulder, not specified as traumatic: Principal | ICD-10-CM

## 2015-10-25 NOTE — Assessment & Plan Note (Addendum)
She is having more of a rotator cuff arthropathy. Seems to be doing relatively well. We discussed icing regimen and home exercises. We discussed which objective is doing which was potentially avoid. Patient declined another injection today. We discussed if worsening symptoms that would be our next possibility or formal physical therapy. Patient is in agreement. Otherwise will follow-up in 2-3 months for further evaluation. Encourage her to take the gabapentin and regular basis. Spent  25 minutes with patient face-to-face and had greater than 50% of counseling including as described above in assessment and plan.

## 2015-10-25 NOTE — Patient Instructions (Signed)
Good to see you  Ice 20 minutes 2 times daily. Usually after activity and before bed. Gabapentin 100mg  at night will help with the sleep and the pain  We went over the vitamins and did timing.  Keep staying active and if shoulder worsens come back and we can do injection.  See me again in 3-4 months.

## 2015-10-25 NOTE — Assessment & Plan Note (Signed)
Discussed home exercises, and discuss the gabapentin could be beneficial. We discussed avoiding certain activities such as lifting heavy things. Patient as long as doing well we'll continue to monitor. Patient does well she can follow-up as needed and otherwise every 2-3 months for further evaluation would be more of a recommendation

## 2015-12-19 DIAGNOSIS — Z961 Presence of intraocular lens: Secondary | ICD-10-CM | POA: Diagnosis not present

## 2015-12-19 DIAGNOSIS — H26492 Other secondary cataract, left eye: Secondary | ICD-10-CM | POA: Diagnosis not present

## 2015-12-19 DIAGNOSIS — H52202 Unspecified astigmatism, left eye: Secondary | ICD-10-CM | POA: Diagnosis not present

## 2015-12-19 DIAGNOSIS — H04123 Dry eye syndrome of bilateral lacrimal glands: Secondary | ICD-10-CM | POA: Diagnosis not present

## 2016-01-04 ENCOUNTER — Encounter: Payer: Self-pay | Admitting: Internal Medicine

## 2016-01-04 ENCOUNTER — Other Ambulatory Visit (INDEPENDENT_AMBULATORY_CARE_PROVIDER_SITE_OTHER): Payer: Medicare Other

## 2016-01-04 ENCOUNTER — Ambulatory Visit (INDEPENDENT_AMBULATORY_CARE_PROVIDER_SITE_OTHER): Payer: Medicare Other | Admitting: Internal Medicine

## 2016-01-04 VITALS — BP 136/73 | HR 62 | Temp 97.9°F | Resp 16 | Ht 62.0 in | Wt 182.0 lb

## 2016-01-04 DIAGNOSIS — K219 Gastro-esophageal reflux disease without esophagitis: Secondary | ICD-10-CM | POA: Diagnosis not present

## 2016-01-04 DIAGNOSIS — E7800 Pure hypercholesterolemia, unspecified: Secondary | ICD-10-CM

## 2016-01-04 DIAGNOSIS — R739 Hyperglycemia, unspecified: Secondary | ICD-10-CM

## 2016-01-04 DIAGNOSIS — I2510 Atherosclerotic heart disease of native coronary artery without angina pectoris: Secondary | ICD-10-CM

## 2016-01-04 DIAGNOSIS — I5032 Chronic diastolic (congestive) heart failure: Secondary | ICD-10-CM | POA: Diagnosis not present

## 2016-01-04 DIAGNOSIS — I10 Essential (primary) hypertension: Secondary | ICD-10-CM

## 2016-01-04 DIAGNOSIS — E038 Other specified hypothyroidism: Secondary | ICD-10-CM

## 2016-01-04 LAB — LIPID PANEL
CHOL/HDL RATIO: 4
CHOLESTEROL: 208 mg/dL — AB (ref 0–200)
HDL: 47.2 mg/dL (ref >39.00)
LDL Cholesterol: 134 mg/dL — ABNORMAL HIGH (ref 0–99)
NonHDL: 161.19
TRIGLYCERIDES: 136 mg/dL (ref 0.0–149.0)
VLDL: 27.2 mg/dL (ref 0.0–40.0)

## 2016-01-04 LAB — COMPREHENSIVE METABOLIC PANEL
ALBUMIN: 4.4 g/dL (ref 3.5–5.2)
ALT: 17 U/L (ref 0–35)
AST: 19 U/L (ref 0–37)
Alkaline Phosphatase: 64 U/L (ref 39–117)
BUN: 15 mg/dL (ref 6–23)
CALCIUM: 10.3 mg/dL (ref 8.4–10.5)
CHLORIDE: 104 meq/L (ref 96–112)
CO2: 29 mEq/L (ref 19–32)
CREATININE: 0.88 mg/dL (ref 0.40–1.20)
GFR: 64.27 mL/min (ref >60.00)
Glucose, Bld: 109 mg/dL — ABNORMAL HIGH (ref 70–99)
POTASSIUM: 5.1 meq/L (ref 3.5–5.1)
Sodium: 141 mEq/L (ref 135–145)
Total Bilirubin: 0.8 mg/dL (ref 0.2–1.2)
Total Protein: 7.4 g/dL (ref 6.0–8.3)

## 2016-01-04 LAB — T4, FREE: FREE T4: 0.84 ng/dL (ref 0.60–1.60)

## 2016-01-04 LAB — TSH: TSH: 1.87 u[IU]/mL (ref 0.35–4.50)

## 2016-01-04 LAB — HEMOGLOBIN A1C: HEMOGLOBIN A1C: 6.2 % (ref 4.6–6.5)

## 2016-01-04 NOTE — Assessment & Plan Note (Signed)
Feels tired and her thyroid dose needs to be adjusted Check tsh  Titrate med dose if needed

## 2016-01-04 NOTE — Patient Instructions (Addendum)
  Test(s) ordered today. Your results will be released to MyChart (or called to you) after review, usually within 72hours after test completion. If any changes need to be made, you will be notified at that same time.   No immunizations administered today.   Medications reviewed and updated.  No changes recommended at this time.  Your prescription(s) have been submitted to your pharmacy. Please take as directed and contact our office if you believe you are having problem(s) with the medication(s).   Please followup in 6 months  

## 2016-01-04 NOTE — Assessment & Plan Note (Signed)
No chest pain, no change in chronic SOB Continue BB, asa and lasix Following with cardiology

## 2016-01-04 NOTE — Assessment & Plan Note (Addendum)
No chest pain, no change in chronic SOB euvolemic on exam Continue BB, asa and lasix Following with cardiology

## 2016-01-04 NOTE — Progress Notes (Signed)
Subjective:    Patient ID: Beverly Kim, female    DOB: 04-14-1924, 82 y.o.   MRN: [id number]  HPI The patient is here for follow up.  She has not been feeling well.  She thinks it is her thyroid and needs to go back to 100 mcg.  She feels very tired.  Her hips ache when she gets up and walks.  This is similar to how she has felt in the past when her thyroid has been underactive. She is concerned the pantoprazole is affecting the medication absorption. She takes pantoprazole approximately 3 times a week.  Hypothyroidism:  She is taking her medication daily.  She feels more fatigued.  Chronic diastolic dysfunction, CAD, Hypertension: She is taking her medication daily. She is compliant with a low sodium diet.  She denies chest pain, palpitations, and regular headaches. She does have some shortness of breath, but this is stable. She is not exercising regularly.  She does not monitor her blood pressure at home.    GERD:  She is taking her medication as needed-approximately 3 times a week.  She denies frequent GERD symptoms and feels her GERD is well controlled.   Left arm and shoulder pain:  She has pain in her left upper arm and shoulder and she has pain with movement. The arm goes numb at night when she sleeps with her arm bent.  She has seen Dr Tamala [name] and has seen orthopedics.  She has a follow up with Dr Tamala [name].  She thinks exercises will help.    Medications and allergies reviewed with patient and updated if appropriate.  Patient Active Problem List   Diagnosis Date Noted  . Degenerative disc disease, cervical 08/23/2015  . Degenerative disc disease, lumbar 08/23/2015  . Left rotator cuff tear arthropathy 07/12/2015  . Left shoulder pain 04/06/2015  . Frozen shoulder 01/03/2015  . Wheezing 02/26/2014  . Cough variant asthma 12/08/2013  . Upper airway cough syndrome 10/27/2013  . Dyspnea 02/03/2013  . Cough due to ACE inhibitor 03/25/2012  . GERD 07/06/2009  . HIATAL HERNIA  07/06/2009  . DIVERTICULOSIS, COLON 07/06/2009  . HEPATIC CYST 07/06/2009  . COLONIC POLYPS, HX OF 07/06/2009  . MIXED HEARING LOSS BILATERAL 12/13/2008  . Essential hypertension 06/19/2008  . MITRAL VALVE PROLAPSE 06/19/2008  . LEFT BUNDLE BRANCH BLOCK 06/19/2008  . Hyperlipidemia 08/12/2007  . Hypothyroidism 12/02/2006  . Coronary atherosclerosis 12/02/2006  . Chronic diastolic heart failure ([place]) 12/02/2006    Current Outpatient Prescriptions on File Prior to Visit  Medication Sig Dispense Refill  . aspirin 81 MG tablet Take 81 mg by mouth daily.    . bisoprolol (ZEBETA) 10 MG tablet Take 1 tablet (10 mg total) by mouth daily. 90 tablet 3  . chlorpheniramine (CHLOR-TRIMETON) 4 MG tablet Take 4 mg by mouth at bedtime as needed (drainage, drippy nose, throat clearing).    Marland Kitchen dextromethorphan (DELSYM) 30 MG/5ML liquid 2 tsp twice daily as needed for cough    . furosemide (LASIX) 20 MG tablet Take 1 tablet (20 mg total) by mouth daily. 90 tablet 3  . levothyroxine (SYNTHROID, LEVOTHROID) 88 MCG tablet Take 1 tablet (88 mcg total) by mouth daily. 90 tablet 3  . pantoprazole (PROTONIX) 20 MG tablet Take 1 tablet (20 mg total) by mouth daily. 90 tablet 1  . vitamin C (ASCORBIC ACID) 500 MG tablet Take 500 mg by mouth daily.    Marland Kitchen gabapentin (NEURONTIN) 100 MG capsule Take 1 capsule (100 mg total)  by mouth at bedtime. (Patient not taking: Reported on 01/04/2016) 30 capsule 3   No current facility-administered medications on file prior to visit.     Past Medical History:  Diagnosis Date  . CHF (congestive heart failure) ([place])   . Colon polyps   . Coronary artery disease   . Diverticulosis   . Dyspnea   . GERD (gastroesophageal reflux disease)   . Hearing loss of both ears   . Hepatic cyst   . Hiatal hernia   . Hyperlipidemia   . Hypertension   . Hypothyroidism   . Low back pain   . Mitral valve disorders   . Other left bundle branch block   . Palpitations   . URI (upper  respiratory infection)     Past Surgical History:  Procedure Laterality Date  . CARDIAC CATHETERIZATION  04-18-01  . COLONOSCOPY    . HERNIA REPAIR    . THYROIDECTOMY    . TONSILLECTOMY AND ADENOIDECTOMY    . TOTAL ABDOMINAL HYSTERECTOMY W/ BILATERAL SALPINGOOPHORECTOMY      Social History   Social History  . Marital status: Widowed    Spouse name: N/A  . Number of children: 4  . Years of education: N/A   Occupational History  . retired    Social History Main Topics  . Smoking status: Never Smoker  . Smokeless tobacco: Never Used  . Alcohol use No  . Drug use: No  . Sexual activity: Not on file   Other Topics Concern  . Not on file   Social History Narrative  . No narrative on file    Family History  Problem Relation Age of Onset  . Coronary artery disease      family hx of 1st degree relative <50  . Diabetes      family hx of  . Other      cardiovascular disorder family hx of  . Other      neurological disorder family hx of  . Other      respiratory disease family hx of  . Heart attack Daughter   . Heart Problems      all children  . Sudden death Son   . Heart disease Brother   . Heart disease Sister   . Colon cancer Neg Hx   . Colon polyps Neg Hx     Review of Systems  Constitutional: Positive for fatigue.  Respiratory: Positive for shortness of breath (when she walks, after she eats or when she sits down sometimes ). Negative for cough and wheezing.   Cardiovascular: Positive for leg swelling (left ankle). Negative for chest pain and palpitations.  Musculoskeletal: Positive for arthralgias.  Neurological: Positive for headaches. Negative for light-headedness.       Objective:   Vitals:   01/04/16 1005  BP: 136/73  Pulse: 62  Resp: 16  Temp: 97.9 F (36.6 C)   Filed Weights   01/04/16 1005  Weight: 182 lb (82.6 kg)   Body mass index is 33.29 kg/m.   Physical Exam    Constitutional: Appears well-developed and well-nourished. No  distress.  HENT:  Head: Normocephalic and atraumatic.  Neck: Neck supple. No tracheal deviation present. No thyromegaly present.  No cervical lymphadenopathy Cardiovascular: Normal rate, regular rhythm and normal heart sounds.   No murmur heard. No carotid bruit .  No edema Pulmonary/Chest: Effort normal and breath sounds normal. No respiratory distress. No has no wheezes. No rales.  Skin: Skin is warm and dry. Not diaphoretic.  Psychiatric: Normal mood and affect. Behavior is normal.      Assessment & Plan:    See Problem List for Assessment and Plan of chronic medical problems.

## 2016-01-04 NOTE — Assessment & Plan Note (Signed)
GERD controlled Continue medication as needed - takes preventatively - takes it about 3/week

## 2016-01-04 NOTE — Progress Notes (Signed)
Pre visit review using our clinic review tool, if applicable. No additional management support is needed unless otherwise documented below in the visit note. 

## 2016-01-04 NOTE — Assessment & Plan Note (Signed)
Check lipid panel Has not tolerated statins in the past

## 2016-01-04 NOTE — Assessment & Plan Note (Signed)
BP well controlled Current regimen effective and well tolerated Continue current medications at current doses cmp  

## 2016-01-04 NOTE — Assessment & Plan Note (Signed)
Check a1c 

## 2016-01-05 ENCOUNTER — Encounter: Payer: Self-pay | Admitting: Internal Medicine

## 2016-01-05 DIAGNOSIS — E119 Type 2 diabetes mellitus without complications: Secondary | ICD-10-CM | POA: Insufficient documentation

## 2016-01-05 DIAGNOSIS — R7303 Prediabetes: Secondary | ICD-10-CM

## 2016-01-11 ENCOUNTER — Telehealth: Payer: Self-pay | Admitting: *Deleted

## 2016-01-11 NOTE — Telephone Encounter (Signed)
Left msg on triage requesting refill on thyroid medication, also she states she haven't received lab results from 11/2...Johny Chess

## 2016-01-11 NOTE — Telephone Encounter (Signed)
Called pt back inform her MD has release the results to the [facility] system. She stated she does not know how to get results. Gave her MD response on email. Pt is wanting MD to increase the Levothyroxine back to 100 mcg. She states she felt a lot better when she was on that strength. Also read that the pantoprazole med decrease the TSH...Johny Chess

## 2016-01-11 NOTE — Telephone Encounter (Signed)
Please remove mychart so I do not send results there.  When she was taking 100 mcg daily her thyroid was overactive which can be dangerous for her heart.  We can try having her take 88 mcg daily which she is on now and once a week she can take an extra pill or two.  This may help a little, but 100 mcg daily is too much.    [if she decides to do the extra 88 mcg once a week adjust medication list}

## 2016-01-12 NOTE — Telephone Encounter (Signed)
Spoke with pt to inform. Pt states that she will take the 88 mcg daily except on sundays she will take 2 tablets. At this time she will hold off on the Endo referral.

## 2016-03-01 ENCOUNTER — Ambulatory Visit: Payer: Self-pay

## 2016-03-01 ENCOUNTER — Encounter: Payer: Self-pay | Admitting: Family Medicine

## 2016-03-01 ENCOUNTER — Ambulatory Visit (INDEPENDENT_AMBULATORY_CARE_PROVIDER_SITE_OTHER): Payer: Medicare Other | Admitting: Family Medicine

## 2016-03-01 VITALS — BP 138/72 | HR 63 | Ht 62.0 in | Wt 183.0 lb

## 2016-03-01 DIAGNOSIS — M79602 Pain in left arm: Secondary | ICD-10-CM

## 2016-03-01 DIAGNOSIS — M12812 Other specific arthropathies, not elsewhere classified, left shoulder: Secondary | ICD-10-CM

## 2016-03-01 DIAGNOSIS — M75102 Unspecified rotator cuff tear or rupture of left shoulder, not specified as traumatic: Secondary | ICD-10-CM

## 2016-03-01 MED ORDER — GABAPENTIN 100 MG PO CAPS: 100.0000 mg | ORAL_CAPSULE | Freq: Every day | ORAL | 3 refills | Status: DC

## 2016-03-01 NOTE — Progress Notes (Signed)
[facility] [address], [place] [address] Phone: [phone] Subjective:    I'm seeing this patient by the request  of:  [name], MD  CC: left shoulder pain Follow-up  [id number]  Beverly Kim is a 82 y.o. female coming in with complaint of left shoulder pain. Doesn't have severe osteophytic changes of the shoulder. Patient elected to try conservative therapy. Patient was doing relatively well but has had worsening symptoms. Continues to have decreased range of motion. Pain seems to be in the lateral aspect of the arm mostly. States that it almost feels like a knife in her arm. Wakes her up at night. Doing more and more activities with the contralateral side.     Past Medical History:  Diagnosis Date  . CHF (congestive heart failure) ([place])   . Colon polyps   . Coronary artery disease   . Diverticulosis   . Dyspnea   . GERD (gastroesophageal reflux disease)   . Hearing loss of both ears   . Hepatic cyst   . Hiatal hernia   . Hyperlipidemia   . Hypertension   . Hypothyroidism   . Low back pain   . Mitral valve disorders(424.0)   . Other left bundle branch block   . Palpitations   . URI (upper respiratory infection)    Past Surgical History:  Procedure Laterality Date  . CARDIAC CATHETERIZATION  04-18-01  . COLONOSCOPY    . HERNIA REPAIR    . THYROIDECTOMY    . TONSILLECTOMY AND ADENOIDECTOMY    . TOTAL ABDOMINAL HYSTERECTOMY W/ BILATERAL SALPINGOOPHORECTOMY     Social History   Social History  . Marital status: Widowed    Spouse name: N/A  . Number of children: 4  . Years of education: N/A   Occupational History  . retired    Social History Main Topics  . Smoking status: Never Smoker  . Smokeless tobacco: Never Used  . Alcohol use No  . Drug use: No  . Sexual activity: Not Asked   Other Topics Concern  . None   Social History Narrative  . None   Allergies  Allergen Reactions  . Adhesive [Tape]  Itching, Dermatitis and Rash    Blisters and "skin bubbles"  . Atorvastatin     REACTION: Reaction not known  . Clindamycin   . Codeine   . Latex   . Penicillins   . Simvastatin     REACTION: fatigue   Family History  Problem Relation Age of Onset  . Coronary artery disease      family hx of 1st degree relative <50  . Diabetes      family hx of  . Other      cardiovascular disorder family hx of  . Other      neurological disorder family hx of  . Other      respiratory disease family hx of  . Heart attack Daughter   . Heart Problems      all children  . Sudden death Son   . Heart disease Brother   . Heart disease Sister   . Colon cancer Neg Hx   . Colon polyps Neg Hx     Past medical history, social, surgical and family history all reviewed in electronic medical record.  No pertanent information unless stated regarding to the chief complaint.   Review of Systems: No headache, visual changes, nausea, vomiting, diarrhea, constipation, dizziness, abdominal pain, skin rash, fevers,  chills, chest pain, shortness of breath, mood changes.   Objective  Blood pressure 138/72, pulse 63, height 5\' 2"  (1.575 m), weight 183 lb (83 kg), SpO2 96 %.  Systems examined below as of 03/01/16 General: NAD A&O x3 mood, affect normal  HEENT: Pupils equal, extraocular movements intact no nystagmus Respiratory: not short of breath at rest or with speaking Cardiovascular: No lower extremity edema, non tender Skin: Warm dry intact with no signs of infection or rash on extremities or on axial skeleton. Abdomen: Soft nontender, no masses Neuro: Cranial nerves  intact, neurovascularly intact in all extremities with 2+ DTRs and 2+ pulses. Lymph: No lymphadenopathy appreciated today  Gait Antalgic gait  MSK:  Non tender with full range of motion and good stability and symmetric strength and tone of , elbows, wrist, hip, knee and ankles bilaterally. Significant arthritic changes of multiple joints    Neck: Inspection unremarkable. No palpable stepoffs. Negative Spurling's maneuver. Patient only has 5 of extension and 10 side bending bilaterally with only 20 of rotation. Mild crepitus noted stable from previous exam Grip strength and sensation normal in bilateral hands Strength good C4 to T1 distribution No sensory change to C4 to T1 Negative Hoffman sign bilaterally Reflexes normal.  Shoulder: left Inspection reveals atrophy of the musculature of the shoulder girdle Diffusely tender passively range of motion is decreased with forward flexion of 95 with severe crepitus and pain with minimal internal and external range of motion. Rotator cuff strength 3 out of 5 Worsening crepitus noted Contralateral shoulder unremarkable  Continued worsening symptoms        Impression and Recommendations:     This case required medical decision making of moderate complexity.      Note: This dictation was prepared with Dragon dictation along with smaller phrase technology. Any transcriptional errors that result from this process are unintentional.

## 2016-03-01 NOTE — Patient Instructions (Addendum)
Good to see you  Physical therapy will call you  Continue the vitamins Vitamin D 2000 IU daily  Calcium 600mg  daily  Use turmeric 500mg  more as needed.  New prescriptions, gabapentin 100mg  at night  See me again in 6 weeks to make sure you are doing well.

## 2016-03-01 NOTE — Assessment & Plan Note (Signed)
Discussed with patient again at great length. We discussed with her that I do feel that this is more secondary to arthritic changes. I do not believe that she'll do reclining a significant amount of the range of motion or function of the shoulder. Patient would like to try formal physical therapy and we will try for range of motion and learning home exercises. Patient given a sheet to work on at home. We discussed icing regimen. Patient declined any other medication but I encourage her to take the gabapentin on a more regular basis to help her with sleep. Patient would not want any surgical intervention at this time. Declined any type of injection. Discussed with her that if she changes her mind to follow-up with [place] sooner otherwise we can check an again in 6 weeks.  Spent  25 minutes with patient face-to-face and had greater than 50% of counseling including as described above in assessment and plan.

## 2016-03-13 ENCOUNTER — Ambulatory Visit: Payer: Medicare Other | Admitting: Physical Therapy

## 2016-03-15 ENCOUNTER — Ambulatory Visit: Payer: Medicare Other

## 2016-03-16 ENCOUNTER — Ambulatory Visit (INDEPENDENT_AMBULATORY_CARE_PROVIDER_SITE_OTHER)
Admission: RE | Admit: 2016-03-16 | Discharge: 2016-03-16 | Disposition: A | Discharge status: Home / Self Care | Payer: Medicare Other | Source: Ambulatory Visit | Attending: Nurse Practitioner | Admitting: Nurse Practitioner

## 2016-03-16 ENCOUNTER — Telehealth: Payer: Self-pay | Admitting: Nurse Practitioner

## 2016-03-16 ENCOUNTER — Encounter: Payer: Self-pay | Admitting: Nurse Practitioner

## 2016-03-16 ENCOUNTER — Ambulatory Visit (INDEPENDENT_AMBULATORY_CARE_PROVIDER_SITE_OTHER): Payer: Medicare Other | Admitting: Nurse Practitioner

## 2016-03-16 VITALS — BP 130/62 | HR 73 | Temp 98.3°F | Resp 20 | Ht 62.0 in | Wt 184.0 lb

## 2016-03-16 DIAGNOSIS — R05 Cough: Secondary | ICD-10-CM | POA: Diagnosis not present

## 2016-03-16 DIAGNOSIS — J209 Acute bronchitis, unspecified: Secondary | ICD-10-CM | POA: Diagnosis not present

## 2016-03-16 DIAGNOSIS — R0989 Other specified symptoms and signs involving the circulatory and respiratory systems: Secondary | ICD-10-CM

## 2016-03-16 DIAGNOSIS — R0602 Shortness of breath: Secondary | ICD-10-CM | POA: Diagnosis not present

## 2016-03-16 DIAGNOSIS — J111 Influenza due to unidentified influenza virus with other respiratory manifestations: Secondary | ICD-10-CM

## 2016-03-16 LAB — POCT INFLUENZA B: Rapid Influenza B Ag: POSITIVE

## 2016-03-16 MED ORDER — OSELTAMIVIR PHOSPHATE 75 MG PO CAPS: 75.0000 mg | ORAL_CAPSULE | Freq: Two times a day (BID) | ORAL | 0 refills | Status: DC

## 2016-03-16 MED ORDER — IPRATROPIUM BROMIDE 0.03 % NA SOLN: 2.0000 | Freq: Two times a day (BID) | NASAL | 0 refills | Status: DC

## 2016-03-16 MED ORDER — ALBUTEROL SULFATE HFA 108 (90 BASE) MCG/ACT IN AERS: 2.0000 | INHALATION_SPRAY | Freq: Four times a day (QID) | RESPIRATORY_TRACT | 0 refills | Status: DC | PRN

## 2016-03-16 MED ORDER — GUAIFENESIN ER 600 MG PO TB12: 600.0000 mg | ORAL_TABLET | Freq: Two times a day (BID) | ORAL | 0 refills | Status: DC | PRN

## 2016-03-16 MED ORDER — ONDANSETRON HCL 4 MG PO TABS: 4.0000 mg | ORAL_TABLET | Freq: Three times a day (TID) | ORAL | 0 refills | Status: DC | PRN

## 2016-03-16 NOTE — Progress Notes (Signed)
Reviewed with patient in office. See office note

## 2016-03-16 NOTE — Telephone Encounter (Signed)
Noted  

## 2016-03-16 NOTE — Progress Notes (Signed)
Pre visit review using our clinic review tool, if applicable. No additional management support is needed unless otherwise documented below in the visit note. 

## 2016-03-16 NOTE — Progress Notes (Signed)
Subjective:  Patient ID: Beverly Kim, female    DOB: 1925-01-17  Age: 83 y.o. MRN: [id number]  CC: Acute Visit (chest congestion and cough, upset stomach x 2 days)   URI   This is a new problem. The current episode started in the past 7 days. The problem has been gradually worsening. The maximum temperature recorded prior to her arrival was 100.4 - 100.9 F. Associated symptoms include congestion, coughing, headaches, joint pain, nausea, rhinorrhea, sinus pain, a sore throat and wheezing. Pertinent negatives include no abdominal pain, chest pain, diarrhea, dysuria, ear pain, joint swelling, rash, sneezing, swollen glands or vomiting. She has tried acetaminophen and increased fluids for the symptoms. The treatment provided no relief.    Outpatient Medications Prior to Visit  Medication Sig Dispense Refill  . aspirin 81 MG tablet Take 81 mg by mouth daily.    . bisoprolol (ZEBETA) 10 MG tablet Take 1 tablet (10 mg total) by mouth daily. 90 tablet 3  . dextromethorphan (DELSYM) 30 MG/5ML liquid 2 tsp twice daily as needed for cough    . furosemide (LASIX) 20 MG tablet Take 1 tablet (20 mg total) by mouth daily. 90 tablet 3  . gabapentin (NEURONTIN) 100 MG capsule Take 1 capsule (100 mg total) by mouth at bedtime. 30 capsule 3  . levothyroxine (SYNTHROID, LEVOTHROID) 88 MCG tablet Take 1 tablet (88 mcg total) by mouth daily. 90 tablet 3  . pantoprazole (PROTONIX) 20 MG tablet Take 1 tablet (20 mg total) by mouth daily. 90 tablet 1  . vitamin C (ASCORBIC ACID) 500 MG tablet Take 500 mg by mouth daily.    . chlorpheniramine (CHLOR-TRIMETON) 4 MG tablet Take 4 mg by mouth at bedtime as needed (drainage, drippy nose, throat clearing).     No facility-administered medications prior to visit.     ROS See HPI  Objective:  BP 130/62   Pulse 73   Temp 98.3 F (36.8 C) (Oral)   Resp 20   Ht 5\' 2"  (1.575 m)   Wt 184 lb (83.5 kg)   SpO2 96%   BMI 33.65 kg/m   BP Readings from Last 3  Encounters:  03/16/16 130/62  03/01/16 138/72  01/04/16 136/73    Wt Readings from Last 3 Encounters:  03/16/16 184 lb (83.5 kg)  03/01/16 183 lb (83 kg)  01/04/16 182 lb (82.6 kg)    Physical Exam  Constitutional: She is oriented to person, place, and time.  HENT:  Right Ear: Tympanic membrane, external ear and ear canal normal.  Left Ear: Tympanic membrane, external ear and ear canal normal.  Nose: Mucosal edema and rhinorrhea present. Right sinus exhibits maxillary sinus tenderness and frontal sinus tenderness. Left sinus exhibits maxillary sinus tenderness and frontal sinus tenderness.  Mouth/Throat: Uvula is midline. No trismus in the jaw. Posterior oropharyngeal erythema present. No oropharyngeal exudate.  Eyes: No scleral icterus.  Neck: Normal range of motion. Neck supple.  Cardiovascular: Normal rate and normal heart sounds.   Pulmonary/Chest: Effort normal. No respiratory distress. She has wheezes. She has rales.  Musculoskeletal: She exhibits no edema.  Lymphadenopathy:    She has no cervical adenopathy.  Neurological: She is alert and oriented to person, place, and time.  Vitals reviewed.   Lab Results  Component Value Date   WBC 8.0 06/28/2015   HGB 14.3 06/28/2015   HCT 42.1 06/28/2015   PLT 235.0 06/28/2015   GLUCOSE 109 (H) 01/04/2016   CHOL 208 (H) 01/04/2016   TRIG  136.0 01/04/2016   HDL 47.20 01/04/2016   LDLDIRECT 163.3 01/30/2012   LDLCALC 134 (H) 01/04/2016   ALT 17 01/04/2016   AST 19 01/04/2016   NA 141 01/04/2016   K 5.1 01/04/2016   CL 104 01/04/2016   CREATININE 0.88 01/04/2016   BUN 15 01/04/2016   CO2 29 01/04/2016   TSH 1.87 01/04/2016   HGBA1C 6.2 01/04/2016    Dg Esophagus  Result Date: 02/17/2014 CLINICAL DATA:  Upper airway cough syndrome.  History of GERD EXAM: ESOPHOGRAM / BARIUM SWALLOW / BARIUM TABLET STUDY TECHNIQUE: Combined double contrast and single contrast examination performed using effervescent crystals, thick  barium liquid, and thin barium liquid. The patient was observed with fluoroscopy swallowing a 47mm barium sulphate tablet. FLUOROSCOPY TIME:  One Min and 6 seconds COMPARISON:  None. FINDINGS: Mild decrease in esophageal motility. No esophageal stricture or mass lesion. There is a hiatal hernia with extensive gastroesophageal reflux. Negative for stricture. Barium tablet passed readily into the stomach without delay. IMPRESSION: Hiatal hernia with extensive gastroesophageal reflux Negative for stricture. Electronically Signed   By: Franchot Gallo M.D.   On: 02/17/2014 14:38    Assessment & Plan:   Beverly Kim was seen today for acute visit.  Diagnoses and all orders for this visit:  Influenza -     POCT Influenza B -     DG Chest 2 View; Future -     ipratropium (ATROVENT) 0.03 % nasal spray; Place 2 sprays into both nostrils 2 (two) times daily. Do not use for more than 5days. -     guaiFENesin (MUCINEX) 600 MG 12 hr tablet; Take 1 tablet (600 mg total) by mouth 2 (two) times daily as needed for cough or to loosen phlegm. -     oseltamivir (TAMIFLU) 75 MG capsule; Take 1 capsule (75 mg total) by mouth 2 (two) times daily. -     ondansetron (ZOFRAN) 4 MG tablet; Take 1 tablet (4 mg total) by mouth every 8 (eight) hours as needed for nausea or vomiting.  Acute bronchitis, unspecified organism -     DG Chest 2 View; Future -     albuterol (PROVENTIL HFA;VENTOLIN HFA) 108 (90 Base) MCG/ACT inhaler; Inhale 2 puffs into the lungs every 6 (six) hours as needed for wheezing or shortness of breath.   I am having Beverly Kim start on ipratropium, guaiFENesin, oseltamivir, albuterol, and ondansetron. I am also having her maintain her vitamin C, chlorpheniramine, dextromethorphan, furosemide, bisoprolol, aspirin, pantoprazole, levothyroxine, and gabapentin.  Meds ordered this encounter  Medications  . ipratropium (ATROVENT) 0.03 % nasal spray    Sig: Place 2 sprays into both nostrils 2 (two) times daily.  Do not use for more than 5days.    Dispense:  30 mL    Refill:  0    Order Specific Question:   Supervising Provider    Answer:   Cassandria Anger [[id number]]  . guaiFENesin (MUCINEX) 600 MG 12 hr tablet    Sig: Take 1 tablet (600 mg total) by mouth 2 (two) times daily as needed for cough or to loosen phlegm.    Dispense:  14 tablet    Refill:  0    Order Specific Question:   Supervising Provider    Answer:   Cassandria Anger [[id number]]  . oseltamivir (TAMIFLU) 75 MG capsule    Sig: Take 1 capsule (75 mg total) by mouth 2 (two) times daily.    Dispense:  10 capsule    Refill:  0    Order Specific Question:   Supervising Provider    Answer:   Cassandria Anger [[id number]]  . albuterol (PROVENTIL HFA;VENTOLIN HFA) 108 (90 Base) MCG/ACT inhaler    Sig: Inhale 2 puffs into the lungs every 6 (six) hours as needed for wheezing or shortness of breath.    Dispense:  1 Inhaler    Refill:  0    Order Specific Question:   Supervising Provider    Answer:   Cassandria Anger [[id number]]  . ondansetron (ZOFRAN) 4 MG tablet    Sig: Take 1 tablet (4 mg total) by mouth every 8 (eight) hours as needed for nausea or vomiting.    Dispense:  20 tablet    Refill:  0    Order Specific Question:   Supervising Provider    Answer:   Cassandria Anger [[id number]]    Follow-up: Return if symptoms worsen or fail to improve.  Wilfred Lacy, NP

## 2016-03-16 NOTE — Telephone Encounter (Signed)
Pt's Dtr, Mickel Baas, stopped at the front desk on their way out of the office today and Mickel Baas requested that she receive the call regarding the chest x-ray results because the pt is hard of hearing.  Mickel Baas is on the pt's designated party release.  Her ph # is [phone].

## 2016-03-16 NOTE — Patient Instructions (Addendum)
No acute on CXR today. Return to office if no improvement in 1week  Influenza, Adult Influenza ("the flu") is an infection in the lungs, nose, and throat (respiratory tract). It is caused by a virus. The flu causes many common cold symptoms, as well as a high fever and body aches. It can make you feel very sick. The flu spreads easily from person to person (is contagious). Getting a flu shot (influenza vaccination) every year is the best way to prevent the flu. Follow these instructions at home:  Take over-the-counter and prescription medicines only as told by your doctor.  Use a cool mist humidifier to add moisture (humidity) to the air in your home. This can make it easier to breathe.  Rest as needed.  Drink enough fluid to keep your pee (urine) clear or pale yellow.  Cover your mouth and nose when you cough or sneeze.  Wash your hands with soap and water often, especially after you cough or sneeze. If you cannot use soap and water, use hand sanitizer.  Stay home from work or school as told by your doctor. Unless you are visiting your doctor, try to avoid leaving home until your fever has been gone for 24 hours without the use of medicine.  Keep all follow-up visits as told by your doctor. This is important. How is this prevented?  Getting a yearly (annual) flu shot is the best way to avoid getting the flu. You may get the flu shot in late summer, fall, or winter. Ask your doctor when you should get your flu shot.  Wash your hands often or use hand sanitizer often.  Avoid contact with people who are sick during cold and flu season.  Eat healthy foods.  Drink plenty of fluids.  Get enough sleep.  Exercise regularly. Contact a doctor if:  You get new symptoms.  You have:  Chest pain.  Watery poop (diarrhea).  A fever.  Your cough gets worse.  You start to have more mucus.  You feel sick to your stomach (nauseous).  You throw up (vomit). Get help right away  if:  You start to be short of breath or have trouble breathing.  Your skin or nails turn a bluish color.  You have very bad pain or stiffness in your neck.  You get a sudden headache.  You get sudden pain in your face or ear.  You cannot stop throwing up. This information is not intended to replace advice given to you by your health care provider. Make sure you discuss any questions you have with your health care provider. Document Released: 11/29/2007 Document Revised: 07/28/2015 Document Reviewed: 12/14/2014 Elsevier Interactive Patient Education  2017 [employer].

## 2016-03-20 ENCOUNTER — Telehealth: Payer: Self-pay | Admitting: Internal Medicine

## 2016-03-20 ENCOUNTER — Encounter: Payer: Self-pay | Admitting: Internal Medicine

## 2016-03-20 ENCOUNTER — Ambulatory Visit (INDEPENDENT_AMBULATORY_CARE_PROVIDER_SITE_OTHER): Payer: Medicare Other | Admitting: Internal Medicine

## 2016-03-20 ENCOUNTER — Ambulatory Visit: Payer: Medicare Other | Admitting: Physical Therapy

## 2016-03-20 VITALS — BP 144/90 | HR 68 | Temp 98.3°F | Resp 20 | Wt 181.0 lb

## 2016-03-20 DIAGNOSIS — J0100 Acute maxillary sinusitis, unspecified: Secondary | ICD-10-CM | POA: Diagnosis not present

## 2016-03-20 DIAGNOSIS — J111 Influenza due to unidentified influenza virus with other respiratory manifestations: Secondary | ICD-10-CM | POA: Diagnosis not present

## 2016-03-20 DIAGNOSIS — R062 Wheezing: Secondary | ICD-10-CM | POA: Insufficient documentation

## 2016-03-20 MED ORDER — BENZONATATE 200 MG PO CAPS: 200.0000 mg | ORAL_CAPSULE | Freq: Three times a day (TID) | ORAL | 0 refills | Status: DC | PRN

## 2016-03-20 MED ORDER — SYNTHROID 88 MCG PO TABS: 88.0000 ug | ORAL_TABLET | Freq: Every day | ORAL | 0 refills | Status: DC

## 2016-03-20 MED ORDER — PREDNISONE 20 MG PO TABS: 40.0000 mg | ORAL_TABLET | Freq: Every day | ORAL | 0 refills | Status: DC

## 2016-03-20 MED ORDER — AZITHROMYCIN 250 MG PO TABS
ORAL_TABLET | ORAL | 0 refills | Status: DC
Start: 2016-03-20 — End: 2016-05-16

## 2016-03-20 MED ORDER — PANTOPRAZOLE SODIUM 20 MG PO TBEC: 20.0000 mg | DELAYED_RELEASE_TABLET | Freq: Every day | ORAL | 1 refills | Status: DC

## 2016-03-20 MED ORDER — LEVOTHYROXINE SODIUM 88 MCG PO TABS: 88.0000 ug | ORAL_TABLET | Freq: Every day | ORAL | 3 refills | Status: DC

## 2016-03-20 NOTE — Patient Instructions (Signed)
Start prednisone - take as prescribed  Take the zpak as prescribed  Use the cough pills as needed.  Continue the other medications for symptom relief.    If no improvement call.

## 2016-03-20 NOTE — Assessment & Plan Note (Signed)
Concern for possible bacterial infection - sinus infection, ? Early bronchitis Start zpak Continue supportive measure

## 2016-03-20 NOTE — Progress Notes (Signed)
Pre visit review using our clinic review tool, if applicable. No additional management support is needed unless otherwise documented below in the visit note. 

## 2016-03-20 NOTE — Telephone Encounter (Signed)
PLEASE NOTE: All timestamps contained within this report are represented as [place] Standard Time. CONFIDENTIALTY NOTICE: This fax transmission is intended only for the addressee. It contains information that is legally privileged, confidential or otherwise protected from use or disclosure. If you are not the intended recipient, you are strictly prohibited from reviewing, disclosing, copying using or disseminating any of this information or taking any action in reliance on or regarding this information. If you have received this fax in error, please notify us immediately by telephone so that we can arrange for its return to [place]. Phone: [phone], Toll-Free: [phone], Fax: [phone] Page: 1 of 1 Call Id: [id number] [facility] [place] Patient Name: Beverly Kim DOB: 1925/02/08 Initial Comment Caller states her mother was Dx with the flu on Friday. Was given X ray on Friday for pneumonia but did come back clear but now cough has become much worse. Coughing clear mucus up. Nurse Assessment Nurse: Dimas Chyle, RN, Dellis Filbert Date/Time Eilene Ghazi Time): 03/20/2016 10:55:32 AM Confirm and document reason for call. If symptomatic, describe symptoms. ---Caller states her mother was Dx with the flu on Friday. Was given X ray on Friday for pneumonia but did come back clear but now cough has become much worse. Coughing clear mucus up. Has been on Tamiflu since Friday. No fever. Does the patient have any new or worsening symptoms? ---Yes Will a triage be completed? ---Yes Related visit to physician within the last 2 weeks? ---Yes Does the PT have any chronic conditions? (i.e. diabetes, asthma, etc.) ---Yes List chronic conditions. ---CHF Is this a behavioral health or substance abuse call? ---No Guidelines Guideline Title Affirmed Question Affirmed Notes Influenza Follow-up Call [1] HIGH RISK (e.g., age > 72 years, pregnant,  HIV+, or chronic medical condition) AND [2] > 72 hours (3 days) since evaluated by HCP AND [3] symptoms not improved Final Disposition User Call PCP within 24 Hours Dimas Chyle, RN, [employer] Referrals REFERRED TO PCP OFFICE Disagree/Comply: Leta Baptist

## 2016-03-20 NOTE — Assessment & Plan Note (Signed)
Influenza positive Will finish tamiflu tomorrow - ? Helped a little Continue support care and close monitoring by her daughter

## 2016-03-20 NOTE — Progress Notes (Signed)
Subjective:    Patient ID: Beverly Kim, female    DOB: March 03, 1925, 83 y.o.   MRN: [id number]  HPI  She is here for follow up of the flu.    She was here 03/16/16 and was having symptoms for one week.  Her symptoms were worsening.  Her temp at that time was elevated up to 100.9 and she had several cold/flu symptoms.  She was positive for the flu and started on tamiflu and other symptomatic medications.   She has two pills left of the tamiflu.  She is taking all the symptomatic medications. Feels better but still feels terrible.  She is drinking pedialyte, juice, and is eating, but is eating lighter than usual.   Her legs are weak and ache, they are tired. Her legs feel like they are pulling apart when she lays down.   She is taking tylenol sometimes and it helps.    She is still coughing and it is worse at night.  She hears wheezing and a rattle in her chest.  She coughs up something on occasion and it is clear/foamy.  Her wheeze and rattle are worse at night. She has SOB.     She is still low grade fevers, fatigue, ear pain, nasal congestion, runny nose, nausea, and lightheadedness.    Medications and allergies reviewed with patient and updated if appropriate.  Patient Active Problem List   Diagnosis Date Noted  . Prediabetes 01/05/2016  . Hyperglycemia 01/04/2016  . Degenerative disc disease, cervical 08/23/2015  . Degenerative disc disease, lumbar 08/23/2015  . Left rotator cuff tear arthropathy 07/12/2015  . Left shoulder pain 04/06/2015  . Frozen shoulder 01/03/2015  . Cough variant asthma 12/08/2013  . Upper airway cough syndrome 10/27/2013  . Dyspnea 02/03/2013  . Cough due to ACE inhibitor 03/25/2012  . GERD 07/06/2009  . HIATAL HERNIA 07/06/2009  . DIVERTICULOSIS, COLON 07/06/2009  . HEPATIC CYST 07/06/2009  . COLONIC POLYPS, HX OF 07/06/2009  . MIXED HEARING LOSS BILATERAL 12/13/2008  . Essential hypertension 06/19/2008  . MITRAL VALVE PROLAPSE 06/19/2008  .  LEFT BUNDLE BRANCH BLOCK 06/19/2008  . Hyperlipidemia 08/12/2007  . Hypothyroidism 12/02/2006  . Coronary atherosclerosis 12/02/2006  . Chronic diastolic heart failure ([place]) 12/02/2006    Current Outpatient Prescriptions on File Prior to Visit  Medication Sig Dispense Refill  . albuterol (PROVENTIL HFA;VENTOLIN HFA) 108 (90 Base) MCG/ACT inhaler Inhale 2 puffs into the lungs every 6 (six) hours as needed for wheezing or shortness of breath. 1 Inhaler 0  . aspirin 81 MG tablet Take 81 mg by mouth daily.    . bisoprolol (ZEBETA) 10 MG tablet Take 1 tablet (10 mg total) by mouth daily. 90 tablet 3  . chlorpheniramine (CHLOR-TRIMETON) 4 MG tablet Take 4 mg by mouth at bedtime as needed (drainage, drippy nose, throat clearing).    Marland Kitchen dextromethorphan (DELSYM) 30 MG/5ML liquid 2 tsp twice daily as needed for cough    . furosemide (LASIX) 20 MG tablet Take 1 tablet (20 mg total) by mouth daily. 90 tablet 3  . gabapentin (NEURONTIN) 100 MG capsule Take 1 capsule (100 mg total) by mouth at bedtime. 30 capsule 3  . guaiFENesin (MUCINEX) 600 MG 12 hr tablet Take 1 tablet (600 mg total) by mouth 2 (two) times daily as needed for cough or to loosen phlegm. 14 tablet 0  . ipratropium (ATROVENT) 0.03 % nasal spray Place 2 sprays into both nostrils 2 (two) times daily. Do not use for  more than 5days. 30 mL 0  . levothyroxine (SYNTHROID, LEVOTHROID) 88 MCG tablet Take 1 tablet (88 mcg total) by mouth daily. 90 tablet 3  . ondansetron (ZOFRAN) 4 MG tablet Take 1 tablet (4 mg total) by mouth every 8 (eight) hours as needed for nausea or vomiting. 20 tablet 0  . oseltamivir (TAMIFLU) 75 MG capsule Take 1 capsule (75 mg total) by mouth 2 (two) times daily. 10 capsule 0  . pantoprazole (PROTONIX) 20 MG tablet Take 1 tablet (20 mg total) by mouth daily. 90 tablet 1  . vitamin C (ASCORBIC ACID) 500 MG tablet Take 500 mg by mouth daily.     No current facility-administered medications on file prior to visit.      Past Medical History:  Diagnosis Date  . CHF (congestive heart failure) ([place])   . Colon polyps   . Coronary artery disease   . Diverticulosis   . Dyspnea   . GERD (gastroesophageal reflux disease)   . Hearing loss of both ears   . Hepatic cyst   . Hiatal hernia   . Hyperlipidemia   . Hypertension   . Hypothyroidism   . Low back pain   . Mitral valve disorders(424.0)   . Other left bundle branch block   . Palpitations   . URI (upper respiratory infection)     Past Surgical History:  Procedure Laterality Date  . CARDIAC CATHETERIZATION  04-18-01  . COLONOSCOPY    . HERNIA REPAIR    . THYROIDECTOMY    . TONSILLECTOMY AND ADENOIDECTOMY    . TOTAL ABDOMINAL HYSTERECTOMY W/ BILATERAL SALPINGOOPHORECTOMY      Social History   Social History  . Marital status: Widowed    Spouse name: N/A  . Number of children: 4  . Years of education: N/A   Occupational History  . retired    Social History Main Topics  . Smoking status: Never Smoker  . Smokeless tobacco: Never Used  . Alcohol use No  . Drug use: No  . Sexual activity: Not on file   Other Topics Concern  . Not on file   Social History Narrative  . No narrative on file    Family History  Problem Relation Age of Onset  . Coronary artery disease      family hx of 1st degree relative <50  . Diabetes      family hx of  . Other      cardiovascular disorder family hx of  . Other      neurological disorder family hx of  . Other      respiratory disease family hx of  . Heart attack Daughter   . Heart Problems      all children  . Sudden death Son   . Heart disease Brother   . Heart disease Sister   . Colon cancer Neg Hx   . Colon polyps Neg Hx     Review of Systems  Constitutional: Positive for appetite change (decreased), fatigue and fever (low grade).  HENT: Positive for congestion (yellow mucus), ear pain and rhinorrhea. Negative for sinus pain and sore throat.   Respiratory: Positive for cough  (productive of clear-foamy phlegm), shortness of breath and wheezing.   Gastrointestinal: Positive for nausea. Negative for abdominal pain and diarrhea.  Musculoskeletal: Positive for myalgias.  Neurological: Positive for light-headedness. Negative for headaches.       Objective:   Vitals:   03/20/16 1638  BP: (!) 144/90  Pulse: 68  Resp: 20  Temp: 98.3 F (36.8 C)   Filed Weights   03/20/16 1638  Weight: 181 lb (82.1 kg)   Body mass index is 33.11 kg/m.  Wt Readings from Last 3 Encounters:  03/20/16 181 lb (82.1 kg)  03/16/16 184 lb (83.5 kg)  03/01/16 183 lb (83 kg)     Physical Exam GENERAL APPEARANCE: Appears stated age, well appearing, NAD EYES: conjunctiva clear, no icterus HEENT: bilateral tympanic membranes and ear canals normal, oropharynx with mild erythema, no thyromegaly, trachea midline, no cervical or supraclavicular lymphadenopathy LUNGS: Unlabored breathing, good air entry bilaterally, diffuse expiratory wheeze, no obvious crackles HEART: Normal S1,S2 without murmurs EXTREMITIES: Without clubbing, cyanosis, or edema        Assessment & Plan:   See Problem List for Assessment and Plan of chronic medical problems.

## 2016-03-20 NOTE — Assessment & Plan Note (Addendum)
Diffuse wheeze on exam, asthma exacerbation from flu, possible early bacterial infection CXR normal a few days ago so will not repeat today Prednisone 40 mg daily x 5 days zpak Albuterol inhaler prn Tessalon perles Continue symptomatic medications Continue good hydration  If her symptoms are not improving or worsen she and her daughter were instructed to call immediately

## 2016-03-21 ENCOUNTER — Ambulatory Visit: Payer: Self-pay | Admitting: Internal Medicine

## 2016-04-12 ENCOUNTER — Ambulatory Visit: Payer: Medicare Other | Admitting: Family Medicine

## 2016-04-26 ENCOUNTER — Other Ambulatory Visit: Payer: Self-pay | Admitting: Nurse Practitioner

## 2016-04-26 DIAGNOSIS — J209 Acute bronchitis, unspecified: Secondary | ICD-10-CM

## 2016-05-03 DIAGNOSIS — Z950 Presence of cardiac pacemaker: Secondary | ICD-10-CM

## 2016-05-03 HISTORY — DX: Presence of cardiac pacemaker: Z95.0

## 2016-05-09 ENCOUNTER — Other Ambulatory Visit: Payer: Self-pay | Admitting: Cardiology

## 2016-05-14 ENCOUNTER — Encounter (HOSPITAL_BASED_OUTPATIENT_CLINIC_OR_DEPARTMENT_OTHER): Payer: Self-pay | Admitting: *Deleted

## 2016-05-14 ENCOUNTER — Emergency Department (HOSPITAL_BASED_OUTPATIENT_CLINIC_OR_DEPARTMENT_OTHER): Payer: Medicare Other

## 2016-05-14 ENCOUNTER — Inpatient Hospital Stay (HOSPITAL_BASED_OUTPATIENT_CLINIC_OR_DEPARTMENT_OTHER)
Admission: EM | Admit: 2016-05-14 | Discharge: 2016-05-16 | DRG: 242 | Disposition: A | Discharge status: Home / Self Care | Payer: Medicare Other | Attending: Cardiology | Admitting: Cardiology

## 2016-05-14 ENCOUNTER — Encounter: Payer: Self-pay | Admitting: Cardiology

## 2016-05-14 DIAGNOSIS — I2510 Atherosclerotic heart disease of native coronary artery without angina pectoris: Secondary | ICD-10-CM | POA: Diagnosis present

## 2016-05-14 DIAGNOSIS — I495 Sick sinus syndrome: Secondary | ICD-10-CM | POA: Diagnosis not present

## 2016-05-14 DIAGNOSIS — Z86010 Personal history of colonic polyps: Secondary | ICD-10-CM

## 2016-05-14 DIAGNOSIS — H9193 Unspecified hearing loss, bilateral: Secondary | ICD-10-CM | POA: Diagnosis present

## 2016-05-14 DIAGNOSIS — M79622 Pain in left upper arm: Secondary | ICD-10-CM | POA: Diagnosis present

## 2016-05-14 DIAGNOSIS — Z880 Allergy status to penicillin: Secondary | ICD-10-CM | POA: Diagnosis not present

## 2016-05-14 DIAGNOSIS — R0602 Shortness of breath: Secondary | ICD-10-CM | POA: Diagnosis not present

## 2016-05-14 DIAGNOSIS — K219 Gastro-esophageal reflux disease without esophagitis: Secondary | ICD-10-CM | POA: Diagnosis present

## 2016-05-14 DIAGNOSIS — Z79899 Other long term (current) drug therapy: Secondary | ICD-10-CM

## 2016-05-14 DIAGNOSIS — Z91040 Latex allergy status: Secondary | ICD-10-CM

## 2016-05-14 DIAGNOSIS — Z91048 Other nonmedicinal substance allergy status: Secondary | ICD-10-CM

## 2016-05-14 DIAGNOSIS — M25512 Pain in left shoulder: Secondary | ICD-10-CM | POA: Diagnosis not present

## 2016-05-14 DIAGNOSIS — I5033 Acute on chronic diastolic (congestive) heart failure: Secondary | ICD-10-CM | POA: Diagnosis present

## 2016-05-14 DIAGNOSIS — Z888 Allergy status to other drugs, medicaments and biological substances status: Secondary | ICD-10-CM | POA: Diagnosis not present

## 2016-05-14 DIAGNOSIS — I459 Conduction disorder, unspecified: Secondary | ICD-10-CM | POA: Diagnosis not present

## 2016-05-14 DIAGNOSIS — I110 Hypertensive heart disease with heart failure: Secondary | ICD-10-CM | POA: Diagnosis present

## 2016-05-14 DIAGNOSIS — I452 Bifascicular block: Secondary | ICD-10-CM | POA: Diagnosis present

## 2016-05-14 DIAGNOSIS — I442 Atrioventricular block, complete: Principal | ICD-10-CM

## 2016-05-14 DIAGNOSIS — Z7952 Long term (current) use of systemic steroids: Secondary | ICD-10-CM | POA: Diagnosis not present

## 2016-05-14 DIAGNOSIS — Z8249 Family history of ischemic heart disease and other diseases of the circulatory system: Secondary | ICD-10-CM | POA: Diagnosis not present

## 2016-05-14 DIAGNOSIS — G8929 Other chronic pain: Secondary | ICD-10-CM | POA: Diagnosis not present

## 2016-05-14 DIAGNOSIS — E785 Hyperlipidemia, unspecified: Secondary | ICD-10-CM | POA: Diagnosis present

## 2016-05-14 DIAGNOSIS — Z7982 Long term (current) use of aspirin: Secondary | ICD-10-CM

## 2016-05-14 DIAGNOSIS — R001 Bradycardia, unspecified: Secondary | ICD-10-CM | POA: Diagnosis not present

## 2016-05-14 DIAGNOSIS — I712 Thoracic aortic aneurysm, without rupture: Secondary | ICD-10-CM | POA: Diagnosis not present

## 2016-05-14 DIAGNOSIS — Z959 Presence of cardiac and vascular implant and graft, unspecified: Secondary | ICD-10-CM

## 2016-05-14 DIAGNOSIS — Z885 Allergy status to narcotic agent status: Secondary | ICD-10-CM

## 2016-05-14 DIAGNOSIS — I517 Cardiomegaly: Secondary | ICD-10-CM | POA: Diagnosis not present

## 2016-05-14 LAB — CBC
HCT: 40.4 % (ref 36.0–46.0)
HEMATOCRIT: 37.8 % (ref 36.0–46.0)
HEMOGLOBIN: 12.6 g/dL (ref 12.0–15.0)
Hemoglobin: 13.7 g/dL (ref 12.0–15.0)
MCH: 32.4 pg (ref 26.0–34.0)
MCH: 31.7 pg (ref 26.0–34.0)
MCHC: 33.9 g/dL (ref 30.0–36.0)
MCHC: 33.3 g/dL (ref 30.0–36.0)
MCV: 95.5 fL (ref 78.0–100.0)
MCV: 95.2 fL (ref 78.0–100.0)
PLATELETS: 202 10*3/uL (ref 150–400)
Platelets: 192 10*3/uL (ref 150–400)
RBC: 4.23 MIL/uL (ref 3.87–5.11)
RBC: 3.97 MIL/uL (ref 3.87–5.11)
RDW: 14.2 % (ref 11.5–15.5)
RDW: 14.1 % (ref 11.5–15.5)
WBC: 8.6 10*3/uL (ref 4.0–10.5)
WBC: 8.6 10*3/uL (ref 4.0–10.5)

## 2016-05-14 LAB — CREATININE, SERUM
Creatinine, Ser: 1.04 mg/dL — ABNORMAL HIGH (ref 0.44–1.00)
GFR calc Af Amer: 54 mL/min — ABNORMAL LOW (ref >60)
GFR calc non Af Amer: 46 mL/min — ABNORMAL LOW (ref >60)

## 2016-05-14 LAB — COMPREHENSIVE METABOLIC PANEL
ALBUMIN: 4 g/dL (ref 3.5–5.0)
ALT: 34 U/L (ref 14–54)
AST: 39 U/L (ref 15–41)
Alkaline Phosphatase: 60 U/L (ref 38–126)
Anion gap: 10 (ref 5–15)
BUN: 25 mg/dL — ABNORMAL HIGH (ref 6–20)
CHLORIDE: 103 mmol/L (ref 101–111)
CO2: 20 mmol/L — ABNORMAL LOW (ref 22–32)
Calcium: 9.2 mg/dL (ref 8.9–10.3)
Creatinine, Ser: 1.16 mg/dL — ABNORMAL HIGH (ref 0.44–1.00)
GFR calc Af Amer: 47 mL/min — ABNORMAL LOW (ref >60)
GFR, EST NON AFRICAN AMERICAN: 40 mL/min — AB (ref >60)
GLUCOSE: 165 mg/dL — AB (ref 65–99)
POTASSIUM: 4.5 mmol/L (ref 3.5–5.1)
SODIUM: 133 mmol/L — AB (ref 135–145)
Total Bilirubin: 0.8 mg/dL (ref 0.3–1.2)
Total Protein: 7.3 g/dL (ref 6.5–8.1)

## 2016-05-14 LAB — MRSA PCR SCREENING: MRSA by PCR: NEGATIVE

## 2016-05-14 LAB — TROPONIN I: Troponin I: <0.03 ng/mL (ref <0.03)

## 2016-05-14 LAB — TSH: TSH: 1.398 u[IU]/mL (ref 0.350–4.500)

## 2016-05-14 LAB — MAGNESIUM: MAGNESIUM: 2.3 mg/dL (ref 1.7–2.4)

## 2016-05-14 MED ORDER — ATROPINE SULFATE 1 MG/ML IJ SOLN
0.5000 mg | INTRAMUSCULAR | Status: DC | PRN
  Filled 2016-05-14: qty 1

## 2016-05-14 MED ORDER — LEVOTHYROXINE SODIUM 88 MCG PO TABS
88.0000 ug | ORAL_TABLET | Freq: Every day | ORAL | Status: DC
  Administered 2016-05-15 – 2016-05-16 (×2): 88 ug via ORAL
  Filled 2016-05-14 (×2): qty 1

## 2016-05-14 MED ORDER — ATROPINE SULFATE 1 MG/10ML IJ SOSY
PREFILLED_SYRINGE | INTRAMUSCULAR | Status: AC
  Filled 2016-05-14: qty 10

## 2016-05-14 MED ORDER — ALBUTEROL SULFATE (2.5 MG/3ML) 0.083% IN NEBU: 3.0000 mL | INHALATION_SOLUTION | Freq: Four times a day (QID) | RESPIRATORY_TRACT | Status: DC | PRN

## 2016-05-14 MED ORDER — HEPARIN SODIUM (PORCINE) 5000 UNIT/ML IJ SOLN
5000.0000 [IU] | Freq: Three times a day (TID) | INTRAMUSCULAR | Status: DC
  Administered 2016-05-14 – 2016-05-15 (×2): 5000 [IU] via SUBCUTANEOUS
  Filled 2016-05-14 (×2): qty 1

## 2016-05-14 MED ORDER — FUROSEMIDE 20 MG PO TABS: 20.0000 mg | ORAL_TABLET | Freq: Every day | ORAL | Status: DC

## 2016-05-14 MED ORDER — ASPIRIN EC 81 MG PO TBEC
81.0000 mg | DELAYED_RELEASE_TABLET | Freq: Every day | ORAL | Status: DC
  Administered 2016-05-15: 81 mg via ORAL

## 2016-05-14 MED ORDER — SODIUM CHLORIDE 0.9% FLUSH: 3.0000 mL | INTRAVENOUS | Status: DC | PRN

## 2016-05-14 MED ORDER — FUROSEMIDE 20 MG PO TABS
20.0000 mg | ORAL_TABLET | Freq: Every day | ORAL | Status: DC
  Administered 2016-05-16: 20 mg via ORAL
  Filled 2016-05-14: qty 1

## 2016-05-14 MED ORDER — ONDANSETRON HCL 4 MG/2ML IJ SOLN: 4.0000 mg | Freq: Four times a day (QID) | INTRAMUSCULAR | Status: DC | PRN

## 2016-05-14 MED ORDER — FUROSEMIDE 10 MG/ML IJ SOLN
40.0000 mg | Freq: Once | INTRAMUSCULAR | Status: AC
  Administered 2016-05-14: 40 mg via INTRAVENOUS
  Filled 2016-05-14: qty 4

## 2016-05-14 MED ORDER — SODIUM CHLORIDE 0.9 % IV SOLN
250.0000 mL | INTRAVENOUS | Status: DC | PRN
Start: 2016-05-14 — End: 2016-05-15

## 2016-05-14 MED ORDER — PANTOPRAZOLE SODIUM 20 MG PO TBEC
20.0000 mg | DELAYED_RELEASE_TABLET | Freq: Every day | ORAL | Status: DC
  Administered 2016-05-15 – 2016-05-16 (×2): 20 mg via ORAL
  Filled 2016-05-14 (×2): qty 1

## 2016-05-14 MED ORDER — ASPIRIN EC 81 MG PO TBEC
81.0000 mg | DELAYED_RELEASE_TABLET | Freq: Every day | ORAL | Status: DC
  Filled 2016-05-14: qty 1

## 2016-05-14 MED ORDER — ACETAMINOPHEN 325 MG PO TABS: 650.0000 mg | ORAL_TABLET | ORAL | Status: DC | PRN

## 2016-05-14 MED ORDER — GABAPENTIN 100 MG PO CAPS
100.0000 mg | ORAL_CAPSULE | Freq: Every day | ORAL | Status: DC
  Administered 2016-05-14: 100 mg via ORAL
  Filled 2016-05-14 (×2): qty 1

## 2016-05-14 MED ORDER — SODIUM CHLORIDE 0.9% FLUSH
3.0000 mL | Freq: Two times a day (BID) | INTRAVENOUS | Status: DC
  Administered 2016-05-14: 3 mL via INTRAVENOUS

## 2016-05-14 NOTE — H&P (Signed)
Physician History and Physical    Beverly Kim MRN: [id number] DOB/AGE: 09-Dec-1924 83 y.o. Admit date: 05/14/2016  Primary Care Physician: [name] Primary Cardiologist: Percival Spanish Referring: Leonette Monarch  HPI: 83 yo with history of nonobstructive CAD, chronic dyspnea/diastolic CHF, HTN, and chronic LBBB presented with increased dyspnea and was found to be in complete heart block.  Her baseline ECG shows LBBB.  Tonight's ECG showed complete heart block with RBBB escape with rate 29.  HR currently about 30 with preserved BP and normal mentation.   Patient has had chronic dyspnea with exertion for several years, she has had workup of this in the past without marked abnormalities found.  About 2 weeks ago, she began to note mild lightheaded spells when walking.  She also noted increased exertional dyspnea (short of breath with walking around the house).  She noted orthopnea as well which was new.  Symptoms were worst last night, unable to lie down to sleep.  No falls, syncope or presyncope.  She has not had any chest pain.  She has had chronic left shoulder and upper arm pain that has been presents for months, she thinks it started after a fall on her shoulder.  This left shoulder pain continues to be present.  She came to ER for evaluation given ongoing dyspnea and as above was noted to be in complete heart block.   Review of systems complete and found to be negative unless listed above   PMH: 1. Chronic LBBB 2. Diastolic CHF: Echo (77/41) with EF 45-50%, inferior and inferoseptal hypokinesis, normal RV, moderate MR. She has a history of chronic dyspnea.  3. CAD: LHC in 2003 with 70% LAD stenosis, treated medically.  - Cardiolite in 11/13 with no ischemia/infarction.  4. GERD 5. Hypothyroidism 6. HTN  Family History  Problem Relation Age of Onset  . Coronary artery disease      family hx of 1st degree relative <50  . Diabetes      family hx of  . Other      cardiovascular disorder  family hx of  . Other      neurological disorder family hx of  . Other      respiratory disease family hx of  . Heart attack Daughter   . Heart Problems      all children  . Sudden death Son   . Heart disease Brother   . Heart disease Sister   . Colon cancer Neg Hx   . Colon polyps Neg Hx     Social History   Social History  . Marital status: Widowed    Spouse name: N/A  . Number of children: 4  . Years of education: N/A   Occupational History  . retired    Social History Main Topics  . Smoking status: Never Smoker  . Smokeless tobacco: Never Used  . Alcohol use No  . Drug use: No  . Sexual activity: Not on file   Other Topics Concern  . Not on file   Social History Narrative  . No narrative on file     Prescriptions Prior to Admission  Medication Sig Dispense Refill Last Dose  . aspirin 81 MG tablet Take 81 mg by mouth daily.   Taking  . azithromycin (ZITHROMAX) 250 MG tablet Take two tabs the first day and then one tab daily for four days 6 tablet 0   . benzonatate (TESSALON) 200 MG capsule Take 1 capsule (200 mg total) by mouth 3 (  three) times daily as needed for cough. 30 capsule 0   . bisoprolol (ZEBETA) 10 MG tablet TAKE ONE TABLET BY MOUTH ONCE DAILY 30 tablet 0   . chlorpheniramine (CHLOR-TRIMETON) 4 MG tablet Take 4 mg by mouth at bedtime as needed (drainage, drippy nose, throat clearing).   Taking  . dextromethorphan (DELSYM) 30 MG/5ML liquid 2 tsp twice daily as needed for cough   Taking  . furosemide (LASIX) 20 MG tablet Take 1 tablet (20 mg total) by mouth daily. 90 tablet 3 Taking  . gabapentin (NEURONTIN) 100 MG capsule Take 1 capsule (100 mg total) by mouth at bedtime. 30 capsule 3 Taking  . guaiFENesin (MUCINEX) 600 MG 12 hr tablet Take 1 tablet (600 mg total) by mouth 2 (two) times daily as needed for cough or to loosen phlegm. 14 tablet 0 Taking  . ipratropium (ATROVENT) 0.03 % nasal spray Place 2 sprays into both nostrils 2 (two) times daily. Do  not use for more than 5days. 30 mL 0 Taking  . ondansetron (ZOFRAN) 4 MG tablet Take 1 tablet (4 mg total) by mouth every 8 (eight) hours as needed for nausea or vomiting. 20 tablet 0 Taking  . oseltamivir (TAMIFLU) 75 MG capsule Take 1 capsule (75 mg total) by mouth 2 (two) times daily. 10 capsule 0 Taking  . pantoprazole (PROTONIX) 20 MG tablet Take 1 tablet (20 mg total) by mouth daily. 90 tablet 1   . predniSONE (DELTASONE) 20 MG tablet Take 2 tablets (40 mg total) by mouth daily with breakfast. 10 tablet 0   . PROAIR HFA 108 (90 Base) MCG/ACT inhaler INHALE TWO PUFFS BY MOUTH EVERY 6 HOURS AS NEEDED FOR WHEEZING OR SHORTNESS OF BREATH 18 g 0   . SYNTHROID 88 MCG tablet Take 1 tablet (88 mcg total) by mouth daily before breakfast. 90 tablet 0   . vitamin C (ASCORBIC ACID) 500 MG tablet Take 500 mg by mouth daily.   Taking    Physical Exam: Blood pressure (!) 133/50, pulse (!) 30, temperature 97.4 F (36.3 C), temperature source Oral, resp. rate 13, height 5\' 2"  (1.575 m), weight 175 lb (79.4 kg), SpO2 100 %.  General: NAD Neck: JVP 10 cm, no thyromegaly or thyroid nodule.  Lungs: Mild crackles at bases. CV: Nondisplaced PMI.  Heart bradycadic, regular S1/S2, no S3/S4, 1/6 SEM RUSB.  No peripheral edema.  No carotid bruit.  Normal pedal pulses.  Abdomen: Soft, nontender, no hepatosplenomegaly, no distention.  Skin: Intact without lesions or rashes.  Neurologic: Alert and oriented x 3.  Psych: Normal affect. Extremities: No clubbing or cyanosis.  HEENT: Normal.   Labs:   Lab Results  Component Value Date   WBC 8.6 05/14/2016   HGB 13.7 05/14/2016   HCT 40.4 05/14/2016   MCV 95.5 05/14/2016   PLT 202 05/14/2016    Recent Labs Lab 05/14/16 1718  NA 133*  K 4.5  CL 103  CO2 20*  BUN 25*  CREATININE 1.16*  CALCIUM 9.2  PROT 7.3  BILITOT 0.8  ALKPHOS 60  ALT 34  AST 39  GLUCOSE 165*   Lab Results  Component Value Date   TROPONINI <0.03 05/14/2016       Radiology: - CXR: Cardiomegaly, no overt edema  EKG (reviewed personally): complete heart block with RBBB escape, rate 29  ASSESSMENT AND PLAN:  83 yo with history of nonobstructive CAD, chronic dyspnea/diastolic CHF, HTN, and chronic LBBB presented with increased dyspnea and was found to be in  complete heart block. 1. Complete heart block: She had baseline conduction disease as evidenced by chronic LBBB.  Now with CHB rate around 30 with RBBB escape.  Suspect progressive conduction system degeneration.  She has preserved BP and mentation.  She has been on bisoprolol 10 mg daily at home, took this morning.  - Stop bisoprolol, but do not think this is likely to be a sufficient step to avoid a PPM.  - Will keep NPO.  Plan PPM unless significant improvement in rhythm by tomorrow morning.  If she develops mental status changes, worsening rate, or fall in BP would plan to place temporary transvenous pacer tonight.  Otherwise, will keep pacing pads on her and continue to observe. EP to see in am.  - No chest pain, doubt ACS as cause of CHB.  However, will cycle troponin.  Continue ASA 81.  2. Acute on chronic diastolic CHF: She does have JVD on exam.  Has baseline exertional dyspnea but worse now with orthopnea.  Suspect bradycardia may be triggering CHF.  - Will give Lasix 40 mg IV x 1 and follow response.  - Will get echo in am.  3. Hypothyroidism: Continue Synthroid, check TSH.   Signed: Loralie Champagne 05/14/2016, 9:53 PM

## 2016-05-14 NOTE — ED Provider Notes (Signed)
[facility] DEPT [facility] Provider Note   CSN: [id number] Arrival date & time: 05/14/16  1705  By signing my name below, I, Jeanell Sparrow, attest that this documentation has been prepared under the direction and in the presence of Fatima Blank, MD. Electronically Signed: Jeanell Sparrow, Scribe. 05/14/2016. 5:22 PM.  History   Chief Complaint Chief Complaint  Patient presents with  . Weakness  . Shortness of Breath   The history is provided by the patient. No language interpreter was used.   HPI Comments: Beverly Kim is a 83 y.o. female with a PMHx of CHF CAD, and LBBB who presents to the Emergency Department complaining of worsening SOB since this morning. She states she has chronic SOB that started a few years ago that has been worsening for several days to weeks, but today was associated with generalized weakness. No treatment PTA. She further reports associated dizziness, "lump in throat", "being smothered" sensation, and left shoulder pain (chronic, currently followed by PCP). She is currently on bisoprolol. Reports recent influenza 2 months ago. She denies any other complaints.     Cardiologist: Dr. Percival Spanish.  PCP: [name], MD  Past Medical History:  Diagnosis Date  . CHF (congestive heart failure) ([place])   . Colon polyps   . Coronary artery disease   . Diverticulosis   . Dyspnea   . GERD (gastroesophageal reflux disease)   . Hearing loss of both ears   . Hepatic cyst   . Hiatal hernia   . Hyperlipidemia   . Hypertension   . Hypothyroidism   . Low back pain   . Mitral valve disorders(424.0)   . Other left bundle branch block   . Palpitations   . URI (upper respiratory infection)     Patient Active Problem List   Diagnosis Date Noted  . Complete heart block ([place]) 05/14/2016  . Wheezing 03/20/2016  . Influenza with respiratory manifestation 03/20/2016  . Acute non-recurrent maxillary sinusitis 03/20/2016  . Prediabetes 01/05/2016  . Hyperglycemia  01/04/2016  . Degenerative disc disease, cervical 08/23/2015  . Degenerative disc disease, lumbar 08/23/2015  . Left rotator cuff tear arthropathy 07/12/2015  . Left shoulder pain 04/06/2015  . Frozen shoulder 01/03/2015  . Cough variant asthma 12/08/2013  . Upper airway cough syndrome 10/27/2013  . Dyspnea 02/03/2013  . Cough due to ACE inhibitor 03/25/2012  . GERD 07/06/2009  . HIATAL HERNIA 07/06/2009  . DIVERTICULOSIS, COLON 07/06/2009  . HEPATIC CYST 07/06/2009  . COLONIC POLYPS, HX OF 07/06/2009  . MIXED HEARING LOSS BILATERAL 12/13/2008  . Essential hypertension 06/19/2008  . MITRAL VALVE PROLAPSE 06/19/2008  . LBBB (left bundle branch block) 06/19/2008  . Hyperlipidemia 08/12/2007  . Hypothyroidism 12/02/2006  . Coronary atherosclerosis 12/02/2006  . Chronic diastolic heart failure ([place]) 12/02/2006    Past Surgical History:  Procedure Laterality Date  . CARDIAC CATHETERIZATION  04-18-01  . COLONOSCOPY    . HERNIA REPAIR    . THYROIDECTOMY    . TONSILLECTOMY AND ADENOIDECTOMY    . TOTAL ABDOMINAL HYSTERECTOMY W/ BILATERAL SALPINGOOPHORECTOMY      OB History    No data available       Home Medications    Prior to Admission medications   Medication Sig Start Date End Date Taking? Authorizing Provider  aspirin 81 MG tablet Take 81 mg by mouth daily.    Historical Provider, MD  azithromycin (ZITHROMAX) 250 MG tablet Take two tabs the first day and then one tab daily for four  days 03/20/16   [name], MD  benzonatate (TESSALON) 200 MG capsule Take 1 capsule (200 mg total) by mouth 3 (three) times daily as needed for cough. 03/20/16   [name], MD  bisoprolol (ZEBETA) 10 MG tablet TAKE ONE TABLET BY MOUTH ONCE DAILY 05/09/16   Minus Breeding, MD  chlorpheniramine (CHLOR-TRIMETON) 4 MG tablet Take 4 mg by mouth at bedtime as needed (drainage, drippy nose, throat clearing).    Historical Provider, MD  dextromethorphan (DELSYM) 30 MG/5ML liquid 2 tsp twice daily  as needed for cough    Historical Provider, MD  furosemide (LASIX) 20 MG tablet Take 1 tablet (20 mg total) by mouth daily. 01/25/15   Minus Breeding, MD  gabapentin (NEURONTIN) 100 MG capsule Take 1 capsule (100 mg total) by mouth at bedtime. 03/01/16   Lyndal Pulley, DO  guaiFENesin (MUCINEX) 600 MG 12 hr tablet Take 1 tablet (600 mg total) by mouth 2 (two) times daily as needed for cough or to loosen phlegm. 03/16/16   Flossie Buffy, NP  ipratropium (ATROVENT) 0.03 % nasal spray Place 2 sprays into both nostrils 2 (two) times daily. Do not use for more than 5days. 03/16/16   Flossie Buffy, NP  ondansetron (ZOFRAN) 4 MG tablet Take 1 tablet (4 mg total) by mouth every 8 (eight) hours as needed for nausea or vomiting. 03/16/16   Flossie Buffy, NP  oseltamivir (TAMIFLU) 75 MG capsule Take 1 capsule (75 mg total) by mouth 2 (two) times daily. 03/16/16   Flossie Buffy, NP  pantoprazole (PROTONIX) 20 MG tablet Take 1 tablet (20 mg total) by mouth daily. 03/20/16   [name], MD  predniSONE (DELTASONE) 20 MG tablet Take 2 tablets (40 mg total) by mouth daily with breakfast. 03/20/16   [name], MD  PROAIR HFA 108 (90 Base) MCG/ACT inhaler INHALE TWO PUFFS BY MOUTH EVERY 6 HOURS AS NEEDED FOR WHEEZING OR SHORTNESS OF BREATH 04/26/16   [name], MD  SYNTHROID 88 MCG tablet Take 1 tablet (88 mcg total) by mouth daily before breakfast. 03/20/16   [name], MD  vitamin C (ASCORBIC ACID) 500 MG tablet Take 500 mg by mouth daily.    Historical Provider, MD    Family History Family History  Problem Relation Age of Onset  . Coronary artery disease      family hx of 1st degree relative <50  . Diabetes      family hx of  . Other      cardiovascular disorder family hx of  . Other      neurological disorder family hx of  . Other      respiratory disease family hx of  . Heart attack Daughter   . Heart Problems      all children  . Sudden death Son   . Heart disease  Brother   . Heart disease Sister   . Colon cancer Neg Hx   . Colon polyps Neg Hx     Social History Social History  Substance Use Topics  . Smoking status: Never Smoker  . Smokeless tobacco: Never Used  . Alcohol use No     Allergies   Adhesive [tape]; Atorvastatin; Clindamycin; Codeine; Latex; Penicillins; and Simvastatin   Review of Systems Review of Systems A complete 10 system review of systems was obtained and all systems are negative except as noted in the HPI and PMH.   Physical Exam Updated Vital Signs  BP 134/73   Pulse 80   Resp 13   Ht 5\' 2"  (1.575 m)   Wt 175 lb (79.4 kg)   SpO2 100%   BMI 32.01 kg/m   Physical Exam  Constitutional: She is oriented to person, place, and time. She appears well-developed and well-nourished. No distress.  HENT:  Head: Normocephalic and atraumatic.  Nose: Nose normal.  Eyes: Conjunctivae and EOM are normal. Pupils are equal, round, and reactive to light. Right eye exhibits no discharge. Left eye exhibits no discharge. No scleral icterus.  Neck: Normal range of motion. Neck supple.  Cardiovascular: Regular rhythm.  Bradycardia present.  Exam reveals no gallop and no friction rub.   No murmur heard. Pulmonary/Chest: Effort normal and breath sounds normal. No stridor. No respiratory distress. She has no rales.  Abdominal: Soft. She exhibits no distension. There is no tenderness.  Musculoskeletal: She exhibits no edema or tenderness.  No extremity edema  Neurological: She is alert and oriented to person, place, and time.  Skin: Skin is warm and dry. No rash noted. She is not diaphoretic. No erythema.  Psychiatric: She has a normal mood and affect.  Vitals reviewed.   ED Treatments / Results  DIAGNOSTIC STUDIES: Oxygen Saturation is 100% on 2L/min O2, normal by my interpretation.    COORDINATION OF CARE: 5:26 PM- Pt advised of plan for treatment and pt agrees.  Labs (all labs ordered are listed, but only abnormal results  are displayed) Labs Reviewed  COMPREHENSIVE METABOLIC PANEL - Abnormal; Notable for the following:       Result Value   Sodium 133 (*)    CO2 20 (*)    Glucose, Bld 165 (*)    BUN 25 (*)    Creatinine, Ser 1.16 (*)    GFR calc non Af Amer 40 (*)    GFR calc Af Amer 47 (*)    All other components within normal limits  CBC  MAGNESIUM  TROPONIN I  TSH  T4    EKG  EKG Interpretation  Date/Time:  Monday May 14 2016 17:18:46 EDT Ventricular Rate:  29 PR Interval:    QRS Duration: 146 QT Interval:  602 QTC Calculation: 418 R Axis:   36 Text Interpretation:  Complete AV block with wide QRS complex Right bundle branch block Baseline wander in lead(s) V2 Confirmed by [facility] MD, Cayce Paschal ([id number]) on 05/14/2016 5:36:20 PM       Radiology Dg Chest Port 1 View  Result Date: 05/14/2016 CLINICAL DATA:  Increased shortness of breath with weakness EXAM: PORTABLE CHEST 1 VIEW COMPARISON:  03/16/2016 FINDINGS: There is mild cardiomegaly without overt failure. No consolidation or pleural effusion. No pneumothorax. Atherosclerosis. Stable calcified lung nodules consistent with granuloma. IMPRESSION: Mild cardiomegaly without overt failure Electronically Signed   By: Donavan Foil M.D.   On: 05/14/2016 18:02    Procedures Procedures (including critical care time) CRITICAL CARE Performed by: Grayce Sessions Shareta Fishbaugh Total critical care time: 45 minutes Critical care time was exclusive of separately billable procedures and treating other patients. Critical care was necessary to treat or prevent imminent or life-threatening deterioration. Critical care was time spent personally by me on the following activities: development of treatment plan with patient and/or surrogate as well as nursing, discussions with consultants, evaluation of patient's response to treatment, examination of patient, obtaining history from patient or surrogate, ordering and performing treatments and interventions, ordering and  review of laboratory studies, ordering and review of radiographic studies, pulse oximetry and re-evaluation of  patient's condition.   Medications Ordered in ED Medications  atropine injection 0.5 mg (not administered)  atropine 1 MG/10ML injection (not administered)     Initial Impression / Assessment and Plan / ED Course  I have reviewed the triage vital signs and the nursing notes.  Pertinent labs & imaging results that were available during my care of the patient were reviewed by me and considered in my medical decision making (see chart for details).     Complete heart block. Patient is on beta blocker. Currently she is hemodynamically stable and mentating well, but short of breath. No acute respiratory distress, however. Patient is attached to the pacer pads. When necessary atropine ordered. Consulted with Dr. Debara Pickett who accepted the patient to cardiology stepdown unit.  1815: Unchanged and HDS.  1845: Unchanged and HDS.  1945: Unchanged and HDS.  2000: Transportation here for transfer. Stable for transfer.  Final Clinical Impressions(s) / ED Diagnoses   Final diagnoses:  SOB (shortness of breath)  Complete heart block ([place])   I personally performed the services described in this documentation, which was scribed in my presence. The recorded information has been reviewed and is accurate.        Fatima Blank, MD 05/14/16 2031

## 2016-05-14 NOTE — ED Notes (Signed)
Patient is alert and oriented x 4 - patient tolerating decreased HR - Code cart at bedside, pads on patient. MD at bedside confirmed patient was a full code.

## 2016-05-14 NOTE — ED Triage Notes (Signed)
Patient comes in with weakness to her lower extremities and feeling like a lump in her throat

## 2016-05-14 NOTE — ED Notes (Signed)
ED Provider at bedside. Dr. Leonette Monarch given ekg to review

## 2016-05-14 NOTE — ED Notes (Signed)
ED Provider at bedside. 

## 2016-05-15 ENCOUNTER — Inpatient Hospital Stay (HOSPITAL_COMMUNITY)
Admission: EM | Disposition: A | Discharge status: Home / Self Care | Payer: Self-pay | Source: Home / Self Care | Attending: Cardiology

## 2016-05-15 ENCOUNTER — Encounter (HOSPITAL_COMMUNITY): Payer: Self-pay | Admitting: Internal Medicine

## 2016-05-15 ENCOUNTER — Inpatient Hospital Stay (HOSPITAL_COMMUNITY): Payer: Medicare Other

## 2016-05-15 DIAGNOSIS — I495 Sick sinus syndrome: Secondary | ICD-10-CM

## 2016-05-15 HISTORY — PX: PACEMAKER IMPLANT: EP1218

## 2016-05-15 LAB — BASIC METABOLIC PANEL
Anion gap: 10 (ref 5–15)
BUN: 23 mg/dL — AB (ref 6–20)
CALCIUM: 9.2 mg/dL (ref 8.9–10.3)
CO2: 26 mmol/L (ref 22–32)
Chloride: 103 mmol/L (ref 101–111)
Creatinine, Ser: 1.12 mg/dL — ABNORMAL HIGH (ref 0.44–1.00)
GFR calc non Af Amer: 42 mL/min — ABNORMAL LOW (ref >60)
GFR, EST AFRICAN AMERICAN: 49 mL/min — AB (ref >60)
Glucose, Bld: 111 mg/dL — ABNORMAL HIGH (ref 65–99)
Potassium: 4.8 mmol/L (ref 3.5–5.1)
SODIUM: 139 mmol/L (ref 135–145)

## 2016-05-15 LAB — CBC
HCT: 38.5 % (ref 36.0–46.0)
Hemoglobin: 12.6 g/dL (ref 12.0–15.0)
MCH: 31.2 pg (ref 26.0–34.0)
MCHC: 32.7 g/dL (ref 30.0–36.0)
MCV: 95.3 fL (ref 78.0–100.0)
PLATELETS: 191 10*3/uL (ref 150–400)
RBC: 4.04 MIL/uL (ref 3.87–5.11)
RDW: 14.2 % (ref 11.5–15.5)
WBC: 9.5 10*3/uL (ref 4.0–10.5)

## 2016-05-15 LAB — SURGICAL PCR SCREEN
MRSA, PCR: NEGATIVE
STAPHYLOCOCCUS AUREUS: NEGATIVE

## 2016-05-15 LAB — TROPONIN I: Troponin I: <0.03 ng/mL (ref <0.03)

## 2016-05-15 SURGERY — PACEMAKER IMPLANT
Laterality: Left | Wound class: Clean

## 2016-05-15 MED ORDER — VANCOMYCIN HCL IN DEXTROSE 1-5 GM/200ML-% IV SOLN
INTRAVENOUS | Status: AC
  Filled 2016-05-15: qty 200

## 2016-05-15 MED ORDER — VANCOMYCIN HCL IN DEXTROSE 1-5 GM/200ML-% IV SOLN
1000.0000 mg | INTRAVENOUS | Status: AC
  Administered 2016-05-15: 1000 mg via INTRAVENOUS

## 2016-05-15 MED ORDER — SODIUM CHLORIDE 0.9 % IV SOLN: INTRAVENOUS | Status: DC

## 2016-05-15 MED ORDER — IOPAMIDOL (ISOVUE-370) INJECTION 76%
INTRAVENOUS | Status: DC | PRN
  Administered 2016-05-15: 15 mL via INTRAVENOUS

## 2016-05-15 MED ORDER — HYDROCODONE-ACETAMINOPHEN 5-325 MG PO TABS
1.0000 | ORAL_TABLET | ORAL | Status: DC | PRN
  Administered 2016-05-15: 1 via ORAL
  Filled 2016-05-15 (×2): qty 1

## 2016-05-15 MED ORDER — SODIUM CHLORIDE 0.9 % IR SOLN: 80.0000 mg | Status: DC

## 2016-05-15 MED ORDER — HEPARIN (PORCINE) IN NACL 2-0.9 UNIT/ML-% IJ SOLN
INTRAMUSCULAR | Status: DC | PRN
  Administered 2016-05-15: 11:00:00

## 2016-05-15 MED ORDER — ACETAMINOPHEN 325 MG PO TABS
325.0000 mg | ORAL_TABLET | ORAL | Status: DC | PRN
  Administered 2016-05-15 (×2): 650 mg via ORAL
  Filled 2016-05-15 (×2): qty 2

## 2016-05-15 MED ORDER — ONDANSETRON HCL 4 MG/2ML IJ SOLN: 4.0000 mg | Freq: Four times a day (QID) | INTRAMUSCULAR | Status: DC | PRN

## 2016-05-15 MED ORDER — HEPARIN (PORCINE) IN NACL 2-0.9 UNIT/ML-% IJ SOLN
INTRAMUSCULAR | Status: AC
  Filled 2016-05-15: qty 500

## 2016-05-15 MED ORDER — LIDOCAINE HCL (PF) 1 % IJ SOLN
INTRAMUSCULAR | Status: AC
  Filled 2016-05-15: qty 30

## 2016-05-15 MED ORDER — VANCOMYCIN HCL IN DEXTROSE 1-5 GM/200ML-% IV SOLN
1000.0000 mg | Freq: Two times a day (BID) | INTRAVENOUS | Status: AC
  Administered 2016-05-15: 1000 mg via INTRAVENOUS
  Filled 2016-05-15: qty 200

## 2016-05-15 MED ORDER — GENTAMICIN SULFATE 40 MG/ML IJ SOLN
INTRAMUSCULAR | Status: AC
  Filled 2016-05-15: qty 2

## 2016-05-15 MED ORDER — CHLORHEXIDINE GLUCONATE 4 % EX LIQD: 60.0000 mL | Freq: Once | CUTANEOUS | Status: DC

## 2016-05-15 MED ORDER — SODIUM CHLORIDE 0.9% FLUSH
3.0000 mL | Freq: Two times a day (BID) | INTRAVENOUS | Status: DC
  Administered 2016-05-15 – 2016-05-16 (×2): 3 mL via INTRAVENOUS

## 2016-05-15 MED ORDER — SODIUM CHLORIDE 0.9 % IV SOLN: 250.0000 mL | INTRAVENOUS | Status: DC | PRN

## 2016-05-15 MED ORDER — SODIUM CHLORIDE 0.9% FLUSH: 3.0000 mL | INTRAVENOUS | Status: DC | PRN

## 2016-05-15 MED ORDER — CHLORHEXIDINE GLUCONATE 4 % EX LIQD
60.0000 mL | Freq: Once | CUTANEOUS | Status: AC
  Administered 2016-05-15: 4 via TOPICAL

## 2016-05-15 MED ORDER — LIDOCAINE HCL (PF) 1 % IJ SOLN
INTRAMUSCULAR | Status: DC | PRN
Start: 2016-05-15 — End: 2016-05-15
  Administered 2016-05-15: 35 mL via INTRADERMAL

## 2016-05-15 SURGICAL SUPPLY — 7 items
CABLE SURGICAL S-101-97-12 (CABLE) ×2 IMPLANT
LEAD TENDRIL SDX 1688TC-52CM (Lead) ×2 IMPLANT
LEAD TENDRIL SDX 2088TC-58CM (Lead) ×2 IMPLANT
PACEMAKER ASSURITY DR-RF (Pacemaker) ×2 IMPLANT
PAD DEFIB LIFELINK (PAD) ×2 IMPLANT
SHEATH CLASSIC 7F (SHEATH) ×4 IMPLANT
TRAY PACEMAKER INSERTION (PACKS) ×2 IMPLANT

## 2016-05-15 NOTE — Interval H&P Note (Signed)
History and Physical Interval Note:  05/15/2016 10:13 AM  Beverly Kim  has presented today for surgery, with the diagnosis of hb  The various methods of treatment have been discussed with the patient and family. After consideration of risks, benefits and other options for treatment, the patient has consented to  Procedure(s): Pacemaker Implant (N/A) as a surgical intervention .  The patient's history has been reviewed, patient examined, no change in status, stable for surgery.  I have reviewed the patient's chart and labs.  Questions were answered to the patient's satisfaction.     Beverly Kim

## 2016-05-15 NOTE — Discharge Summary (Signed)
ELECTROPHYSIOLOGY PROCEDURE DISCHARGE SUMMARY    Patient ID: Beverly Kim,  MRN: [id number], DOB/AGE: [birth year] 83 y.o.  Admit date: 05/14/2016 Discharge date: 05/16/16  Primary Care Physician: Pincus Sanes, MD Primary Cardiologist: [place] Electrophysiologist: Kerrick Miler  Primary Discharge Diagnosis:  Symptomatic complete heart block status post pacemaker implantation this admission  Secondary Discharge Diagnosis:  1.  Non obstructive CAD 2.  Hypothyroidism 3.  Chronic diastolic heart failure 4.  HTN 5.  LBBB  Allergies  Allergen Reactions  . Adhesive [Tape] Itching, Dermatitis, Rash and Other (See Comments)    Blisters and "skin bubbles"  . Atorvastatin Other (See Comments)    REACTION: Reaction not known  . Clindamycin Other (See Comments)    Unknown  . Codeine Other (See Comments)    Unknown  . Latex Other (See Comments)    Unknown  . Penicillins Other (See Comments)    Unknown  . Simvastatin Other (See Comments)    REACTION: fatigue     Procedures This Admission:  1.  Implantation of a STJ dual chamber PPM on 05/15/16 by Dr Johney Frame.  The patient received a STJ model number Assurity MRI PPM with model number 2088 right atrial lead and 2088 right ventricular lead. There were no immediate post procedure complications. 2.  CXR on 05/16/16 demonstrated no pneumothorax status post device implantation.   Brief HPI/Hospital Course:  Beverly Kim is a 83 y.o. female with a past medical history significant for non-obstructive CAD, chronic diastolic heart failure, hypertension and chronic left bundle branch block.  She has had progressive fatigue and shortness of breath as well as intermittent dizziness for the last 2 weeks. Yesterday, she was unable to vacuum her floor without stopping 5 times and presented to the ER for further evaluation. She was found to be in complete heart block and admitted.  She was seen by Dr Johney Frame who recommended pacemaker implantation.  Risks, benefits, and alternatives to PPM implantation were reviewed with the patient who wished to proceed.  The patient underwent implantation of a STJ dual chamber pacemaker with details as outlined above.  She  was monitored on telemetry overnight which demonstrated AV paced rhythm.  Left chest was without hematoma or ecchymosis.  The device was interrogated and found to be functioning normally.  CXR was obtained and demonstrated no pneumothorax status post device implantation.  Wound care, arm mobility, and restrictions were reviewed with the patient.  The patient was examined by Dr. Johney Frame and considered stable for discharge to home.    Physical Exam: Vitals:   05/15/16 2350 05/16/16 0526 05/16/16 0616 05/16/16 0750  BP: (!) 158/65 (!) 148/46    Pulse: 61 (!) 59  65  Resp: 18 14  17   Temp: 97.7 F (36.5 C) 97.4 F (36.3 C)    TempSrc: Oral Oral    SpO2: 99% 95%  97%  Weight:   180 lb 4.8 oz (81.8 kg)   Height:        GEN- The patient is well appearing, alert and oriented x 3 today.   HEENT: normocephalic, atraumatic; sclera clear, conjunctiva pink; hearing intact; oropharynx clear; neck supple  Lungs- CTA b/l, normal work of breathing.  No wheezes, rales, rhonchi Heart- Regular rate and rhythm (paced) GI- soft, non-tender, non-distended Extremities- no clubbing, cyanosis, or edema  MS- no significant deformity or atrophy Skin- warm and dry, no rash or lesion, left chest without hematoma/ecchymosis Psych- euthymic mood, full affect Neuro- strength and sensation are intact  Labs:   Lab Results  Component Value Date   WBC 9.5 05/15/2016   HGB 12.6 05/15/2016   HCT 38.5 05/15/2016   MCV 95.3 05/15/2016   PLT 191 05/15/2016    Recent Labs Lab 05/14/16 1718  05/16/16 0344  NA 133*  < > 136  K 4.5  < > 4.1  CL 103  < > 105  CO2 20*  < > 24  BUN 25*  < > 23*  CREATININE 1.16*  < > 0.86  CALCIUM 9.2  < > 8.8*  PROT 7.3  --   --   BILITOT 0.8  --   --   ALKPHOS 60  --    --   ALT 34  --   --   AST 39  --   --   GLUCOSE 165*  < > 125*  < > = values in this interval not displayed.  Discharge Medications:  Allergies as of 05/16/2016      Reactions   Adhesive [tape] Itching, Dermatitis, Rash, Other (See Comments)   Blisters and "skin bubbles"   Atorvastatin Other (See Comments)   REACTION: Reaction not known   Clindamycin Other (See Comments)   Unknown   Codeine Other (See Comments)   Unknown   Latex Other (See Comments)   Unknown   Penicillins Other (See Comments)   Unknown   Simvastatin Other (See Comments)   REACTION: fatigue      Medication List    STOP taking these medications   azithromycin 250 MG tablet Commonly known as:  ZITHROMAX   benzonatate 200 MG capsule Commonly known as:  TESSALON   oseltamivir 75 MG capsule Commonly known as:  TAMIFLU   predniSONE 20 MG tablet Commonly known as:  DELTASONE     TAKE these medications   aspirin 81 MG tablet Take 81 mg by mouth daily.   BIOTIN PO Take 1 tablet by mouth daily.   bisoprolol 10 MG tablet Commonly known as:  ZEBETA TAKE ONE TABLET BY MOUTH ONCE DAILY   CENTRUM SILVER PO Take 1 tablet by mouth daily.   FISH OIL PO Take 1 capsule by mouth daily.   furosemide 20 MG tablet Commonly known as:  LASIX Take 1 tablet (20 mg total) by mouth every Monday, Wednesday, and Friday.   gabapentin 100 MG capsule Commonly known as:  NEURONTIN Take 1 capsule (100 mg total) by mouth at bedtime.   guaiFENesin 600 MG 12 hr tablet Commonly known as:  MUCINEX Take 1 tablet (600 mg total) by mouth 2 (two) times daily as needed for cough or to loosen phlegm.   ipratropium 0.03 % nasal spray Commonly known as:  ATROVENT Place 2 sprays into both nostrils 2 (two) times daily. Do not use for more than 5days.   ondansetron 4 MG tablet Commonly known as:  ZOFRAN Take 1 tablet (4 mg total) by mouth every 8 (eight) hours as needed for nausea or vomiting.   pantoprazole 20 MG  tablet Commonly known as:  PROTONIX Take 1 tablet (20 mg total) by mouth daily.   PROAIR HFA 108 (90 Base) MCG/ACT inhaler Generic drug:  albuterol INHALE TWO PUFFS BY MOUTH EVERY 6 HOURS AS NEEDED FOR WHEEZING OR SHORTNESS OF BREATH   SYNTHROID 88 MCG tablet Generic drug:  levothyroxine Take 1 tablet (88 mcg total) by mouth daily before breakfast.   vitamin C 500 MG tablet Commonly known as:  ASCORBIC ACID Take 500 mg by mouth daily.  VITAMIN D3 PO Take 2 capsules by mouth daily.       Disposition:  Discharge Instructions    Diet - low sodium heart healthy    Complete by:  As directed    Increase activity slowly    Complete by:  As directed      Follow-up Information    [facility] [employer] [facility] Follow up on 05/30/2016.   Specialty:  Cardiology Why:  at [facility] for wound check  Contact information: [address] [place] [address] [phone]       Hillis Range, MD Follow up on 08/16/2016.   Specialty:  Cardiology Why:  at 10:45AM  Contact information: [address] [place] [address] [phone]           Duration of Discharge Encounter: Greater than 30 minutes including physician time.  Signed, Gypsy Balsam, NP 05/16/2016 10:00 AM  Hillis Range MD, [facility] 05/16/2016 5:04 PM

## 2016-05-15 NOTE — H&P (View-Only) (Signed)
ELECTROPHYSIOLOGY CONSULT NOTE    Patient ID: Beverly Kim MRN: [id number], DOB/AGE: [birth year] 83 y.o.  Admit date: 05/14/2016 Date of Consult: 05/15/2016  Primary Physician: [name], MD Primary Cardiologist: Percival Spanish Requesting MD: Aundra Dubin  Reason for Consultation: complete heart block  HPI:  Beverly Kim is a 83 y.o. female with a past medical history significant for non-obstructive CAD, chronic diastolic heart failure, hypertension and chronic left bundle branch block.  She has had progressive fatigue and shortness of breath as well as intermittent dizziness for the last 2 weeks. Yesterday, she was unable to vacuum her floor without stopping 5 times and presented to the ER for further evaluation. She was found to be in complete heart block.  She currently denies chest pain, shortness of breath at rest, recent fevers, chills, nausea or vomiting.  Last echo 01/2014 demonstrated EF 45-50%, inferior and inferoseptal hypokinesis  Past Medical History:  Diagnosis Date  . CHF (congestive heart failure) ([place])   . Colon polyps   . Coronary artery disease   . Diverticulosis   . Dyspnea   . GERD (gastroesophageal reflux disease)   . Hearing loss of both ears   . Hepatic cyst   . Hiatal hernia   . Hyperlipidemia   . Hypertension   . Hypothyroidism   . Low back pain   . Mitral valve disorders(424.0)   . Other left bundle branch block   . Palpitations   . URI (upper respiratory infection)      Surgical History:  Past Surgical History:  Procedure Laterality Date  . CARDIAC CATHETERIZATION  04-18-01  . COLONOSCOPY    . HERNIA REPAIR    . THYROIDECTOMY    . TONSILLECTOMY AND ADENOIDECTOMY    . TOTAL ABDOMINAL HYSTERECTOMY W/ BILATERAL SALPINGOOPHORECTOMY       Prescriptions Prior to Admission  Medication Sig Dispense Refill Last Dose  . aspirin 81 MG tablet Take 81 mg by mouth daily.   05/14/2016 at Unknown time  . BIOTIN PO Take 1 tablet by mouth daily.   Past Week  at Unknown time  . bisoprolol (ZEBETA) 10 MG tablet TAKE ONE TABLET BY MOUTH ONCE DAILY 30 tablet 0 05/14/2016 at ?  Marland Kitchen Cholecalciferol (VITAMIN D3 PO) Take 2 capsules by mouth daily.   Past Week at Unknown time  . furosemide (LASIX) 20 MG tablet Take 1 tablet (20 mg total) by mouth daily. (Patient taking differently: Take 20 mg by mouth every Monday, Wednesday, and Friday. ) 90 tablet 3 Past Week at Unknown time  . Multiple Vitamins-Minerals (CENTRUM SILVER PO) Take 1 tablet by mouth daily.   Past Week at Unknown time  . Omega-3 Fatty Acids (FISH OIL PO) Take 1 capsule by mouth daily.   Past Week at Unknown time  . SYNTHROID 88 MCG tablet Take 1 tablet (88 mcg total) by mouth daily before breakfast. 90 tablet 0 05/14/2016 at Unknown time  . vitamin C (ASCORBIC ACID) 500 MG tablet Take 500 mg by mouth daily.   Past Week at Unknown time  . azithromycin (ZITHROMAX) 250 MG tablet Take two tabs the first day and then one tab daily for four days (Patient not taking: Reported on 05/15/2016) 6 tablet 0 Completed Course at Unknown time  . benzonatate (TESSALON) 200 MG capsule Take 1 capsule (200 mg total) by mouth 3 (three) times daily as needed for cough. (Patient not taking: Reported on 05/15/2016) 30 capsule 0 Completed Course at Unknown time  . gabapentin (NEURONTIN)  100 MG capsule Take 1 capsule (100 mg total) by mouth at bedtime. (Patient not taking: Reported on 05/15/2016) 30 capsule 3 Not Taking at Unknown time  . guaiFENesin (MUCINEX) 600 MG 12 hr tablet Take 1 tablet (600 mg total) by mouth 2 (two) times daily as needed for cough or to loosen phlegm. (Patient not taking: Reported on 05/15/2016) 14 tablet 0 Not Taking at Unknown time  . ipratropium (ATROVENT) 0.03 % nasal spray Place 2 sprays into both nostrils 2 (two) times daily. Do not use for more than 5days. (Patient not taking: Reported on 05/15/2016) 30 mL 0 Completed Course at Unknown time  . ondansetron (ZOFRAN) 4 MG tablet Take 1 tablet (4 mg total)  by mouth every 8 (eight) hours as needed for nausea or vomiting. 20 tablet 0 Unknown at Unknown  . oseltamivir (TAMIFLU) 75 MG capsule Take 1 capsule (75 mg total) by mouth 2 (two) times daily. (Patient not taking: Reported on 05/15/2016) 10 capsule 0 Completed Course at Unknown time  . pantoprazole (PROTONIX) 20 MG tablet Take 1 tablet (20 mg total) by mouth daily. (Patient not taking: Reported on 05/15/2016) 90 tablet 1 Not Taking at Unknown time  . predniSONE (DELTASONE) 20 MG tablet Take 2 tablets (40 mg total) by mouth daily with breakfast. (Patient not taking: Reported on 05/15/2016) 10 tablet 0 Completed Course at Unknown time  . PROAIR HFA 108 (90 Base) MCG/ACT inhaler INHALE TWO PUFFS BY MOUTH EVERY 6 HOURS AS NEEDED FOR WHEEZING OR SHORTNESS OF BREATH 18 g 0 Unknown at [facility]    Inpatient Medications:  . aspirin EC  81 mg Oral Daily  . aspirin EC  81 mg Oral Daily  . chlorhexidine  60 mL Topical Once  . chlorhexidine  60 mL Topical Once  . furosemide  20 mg Oral Daily  . gabapentin  100 mg Oral QHS  . gentamicin irrigation  80 mg Irrigation On Call  . levothyroxine  88 mcg Oral QAC breakfast  . pantoprazole  20 mg Oral Daily  . sodium chloride flush  3 mL Intravenous Q12H  . vancomycin  1,000 mg Intravenous On Call    Allergies:  Allergies  Allergen Reactions  . Adhesive [Tape] Itching, Dermatitis, Rash and Other (See Comments)    Blisters and "skin bubbles"  . Atorvastatin Other (See Comments)    REACTION: Reaction not known  . Clindamycin Other (See Comments)    Unknown  . Codeine Other (See Comments)    Unknown  . Latex Other (See Comments)    Unknown  . Penicillins Other (See Comments)    Unknown  . Simvastatin Other (See Comments)    REACTION: fatigue    Social History   Social History  . Marital status: Widowed    Spouse name: N/A  . Number of children: 4  . Years of education: N/A   Occupational History  . retired    Social History Main Topics  .  Smoking status: Never Smoker  . Smokeless tobacco: Never Used  . Alcohol use No  . Drug use: No  . Sexual activity: Not on file   Other Topics Concern  . Not on file   Social History Narrative  . No narrative on file     Family History  Problem Relation Age of Onset  . Coronary artery disease      family hx of 1st degree relative <50  . Diabetes      family hx of  . Other  cardiovascular disorder family hx of  . Other      neurological disorder family hx of  . Other      respiratory disease family hx of  . Heart attack Daughter   . Heart Problems      all children  . Sudden death Son   . Heart disease Brother   . Heart disease Sister   . Colon cancer Neg Hx   . Colon polyps Neg Hx      Review of Systems: All other systems reviewed and are otherwise negative except as noted above.  Physical Exam: Vitals:   05/14/16 1915 05/14/16 1948 05/14/16 2100 05/15/16 0500  BP: (!) 127/52 140/59 (!) 133/50   Pulse: (!) 30 (!) 38 (!) 30 (!) 28  Resp: 17 13  18   Temp:   97.4 F (36.3 C) 98 F (36.7 C)  TempSrc:   Oral Oral  SpO2: 100% 95% 100% 97%  Weight:    180 lb 12.8 oz (82 kg)  Height:        GEN- The patient is well appearing, alert and oriented x 3 today.   HEENT: normocephalic, atraumatic; sclera clear, conjunctiva pink; hearing intact; oropharynx clear; neck supple  Lungs- Clear to ausculation bilaterally, normal work of breathing.  No wheezes, rales, rhonchi Heart- Bradycardic regular rate and rhythm  GI- soft, non-tender, non-distended, bowel sounds present Extremities- no clubbing, cyanosis, or edema MS- no significant deformity or atrophy Skin- warm and dry, no rash or lesion Psych- euthymic mood, full affect Neuro- strength and sensation are intact  Labs:   Lab Results  Component Value Date   WBC 9.5 05/15/2016   HGB 12.6 05/15/2016   HCT 38.5 05/15/2016   MCV 95.3 05/15/2016   PLT 191 05/15/2016    Recent Labs Lab 05/14/16 1718   05/15/16 0327  NA 133*  --  139  K 4.5  --  4.8  CL 103  --  103  CO2 20*  --  26  BUN 25*  --  23*  CREATININE 1.16*  < > 1.12*  CALCIUM 9.2  --  9.2  PROT 7.3  --   --   BILITOT 0.8  --   --   ALKPHOS 60  --   --   ALT 34  --   --   AST 39  --   --   GLUCOSE 165*  --  111*  < > = values in this interval not displayed.    Radiology/Studies: Dg Chest Port 1 View Result Date: 05/14/2016 CLINICAL DATA:  Increased shortness of breath with weakness EXAM: PORTABLE CHEST 1 VIEW COMPARISON:  03/16/2016 FINDINGS: There is mild cardiomegaly without overt failure. No consolidation or pleural effusion. No pneumothorax. Atherosclerosis. Stable calcified lung nodules consistent with granuloma. IMPRESSION: Mild cardiomegaly without overt failure Electronically Signed   By: Donavan Foil M.D.   On: 05/14/2016 18:02    YIF:[id number] rhythm with complete heart block, RBBB Previously sinus rhythm with LBBB  TELEMETRY: complete heart block, ventricular rates 20-30  Assessment/Plan: 1.  Complete heart block The patient presented with symptomatic complete heart block.  She is on Bisoprolol at home with last dose yesterday morning  Pacemaker implant is recommended at this time.  Risks, benefits to pacemaker implantation reviewed with patient and family who wish to proceed. Will plan for later today  2.  HTN Stable No change required today  3.  Acute on chronic diastolic heart failure Should improve with pacing  Signed, Chanetta Marshall, NP 05/15/2016 10:01 AM    I have seen, examined the patient, and reviewed the above assessment and plan. On exam, bradycardic rhythm.  Changes to above are made where necessary.  The patient has symptomatic AV block which is not improving off of bisoprolol.  She has a h/o advanced conduction system disease which is likely degenerative in nature.  I do not feel that her conduction will normalize at this point.  I would therefore recommend pacemaker implantation at  this time.  Risks, benefits, alternatives to pacemaker implantation were discussed in detail with the patient and her family today. The patient and family understand that the risks include but are not limited to bleeding, infection, pneumothorax, perforation, tamponade, vascular damage, renal failure, MI, stroke, death,  and lead dislodgement and wishes to proceed. We will therefore schedule the procedure at the next available time.   Co Sign: [name], MD 05/15/2016 10:10 AM

## 2016-05-15 NOTE — Discharge Instructions (Signed)
° ° °  Supplemental Discharge Instructions for  Pacemaker/Defibrillator Patients  Activity No heavy lifting or vigorous activity with your left/right arm for 6 to 8 weeks.  Do not raise your left/right arm above your head for one week.  Gradually raise your affected arm as drawn below.           __         05/19/16                 05/20/16                  05/21/16                   05/22/16  NO DRIVING for   1 week  ; you may begin driving on  4/97/02   .  WOUND CARE - Keep the wound area clean and dry.  Do not get this area wet for one week. No showers for one week; you may shower on  05/22/16   . - The tape/steri-strips on your wound will fall off; do not pull them off.  No bandage is needed on the site.  DO  NOT apply any creams, oils, or ointments to the wound area. - If you notice any drainage or discharge from the wound, any swelling or bruising at the site, or you develop a fever > 101? F after you are discharged home, call the office at once.  Special Instructions - You are still able to use cellular telephones; use the ear opposite the side where you have your pacemaker/defibrillator.  Avoid carrying your cellular phone near your device. - When traveling through airports, show security personnel your identification card to avoid being screened in the metal detectors.  Ask the security personnel to use the hand wand. - Avoid arc welding equipment, MRI testing (magnetic resonance imaging), TENS units (transcutaneous nerve stimulators).  Call the office for questions about other devices. - Avoid electrical appliances that are in poor condition or are not properly grounded. - Microwave ovens are safe to be near or to operate.

## 2016-05-15 NOTE — Consult Note (Signed)
ELECTROPHYSIOLOGY CONSULT NOTE    Patient ID: Beverly Kim MRN: [id number], DOB/AGE: 1924/06/08 83 y.o.  Admit date: 05/14/2016 Date of Consult: 05/15/2016  Primary Physician: [name], MD Primary Cardiologist: Percival Spanish Requesting MD: Aundra Dubin  Reason for Consultation: complete heart block  HPI:  Beverly Kim is a 83 y.o. female with a past medical history significant for non-obstructive CAD, chronic diastolic heart failure, hypertension and chronic left bundle branch block.  She has had progressive fatigue and shortness of breath as well as intermittent dizziness for the last 2 weeks. Yesterday, she was unable to vacuum her floor without stopping 5 times and presented to the ER for further evaluation. She was found to be in complete heart block.  She currently denies chest pain, shortness of breath at rest, recent fevers, chills, nausea or vomiting.  Last echo 01/2014 demonstrated EF 45-50%, inferior and inferoseptal hypokinesis  Past Medical History:  Diagnosis Date  . CHF (congestive heart failure) ([place])   . Colon polyps   . Coronary artery disease   . Diverticulosis   . Dyspnea   . GERD (gastroesophageal reflux disease)   . Hearing loss of both ears   . Hepatic cyst   . Hiatal hernia   . Hyperlipidemia   . Hypertension   . Hypothyroidism   . Low back pain   . Mitral valve disorders(424.0)   . Other left bundle branch block   . Palpitations   . URI (upper respiratory infection)      Surgical History:  Past Surgical History:  Procedure Laterality Date  . CARDIAC CATHETERIZATION  04-18-01  . COLONOSCOPY    . HERNIA REPAIR    . THYROIDECTOMY    . TONSILLECTOMY AND ADENOIDECTOMY    . TOTAL ABDOMINAL HYSTERECTOMY W/ BILATERAL SALPINGOOPHORECTOMY       Prescriptions Prior to Admission  Medication Sig Dispense Refill Last Dose  . aspirin 81 MG tablet Take 81 mg by mouth daily.   05/14/2016 at Unknown time  . BIOTIN PO Take 1 tablet by mouth daily.   Past Week  at Unknown time  . bisoprolol (ZEBETA) 10 MG tablet TAKE ONE TABLET BY MOUTH ONCE DAILY 30 tablet 0 05/14/2016 at ?  Marland Kitchen Cholecalciferol (VITAMIN D3 PO) Take 2 capsules by mouth daily.   Past Week at Unknown time  . furosemide (LASIX) 20 MG tablet Take 1 tablet (20 mg total) by mouth daily. (Patient taking differently: Take 20 mg by mouth every Monday, Wednesday, and Friday. ) 90 tablet 3 Past Week at Unknown time  . Multiple Vitamins-Minerals (CENTRUM SILVER PO) Take 1 tablet by mouth daily.   Past Week at Unknown time  . Omega-3 Fatty Acids (FISH OIL PO) Take 1 capsule by mouth daily.   Past Week at Unknown time  . SYNTHROID 88 MCG tablet Take 1 tablet (88 mcg total) by mouth daily before breakfast. 90 tablet 0 05/14/2016 at Unknown time  . vitamin C (ASCORBIC ACID) 500 MG tablet Take 500 mg by mouth daily.   Past Week at Unknown time  . azithromycin (ZITHROMAX) 250 MG tablet Take two tabs the first day and then one tab daily for four days (Patient not taking: Reported on 05/15/2016) 6 tablet 0 Completed Course at Unknown time  . benzonatate (TESSALON) 200 MG capsule Take 1 capsule (200 mg total) by mouth 3 (three) times daily as needed for cough. (Patient not taking: Reported on 05/15/2016) 30 capsule 0 Completed Course at Unknown time  . gabapentin (NEURONTIN)  100 MG capsule Take 1 capsule (100 mg total) by mouth at bedtime. (Patient not taking: Reported on 05/15/2016) 30 capsule 3 Not Taking at Unknown time  . guaiFENesin (MUCINEX) 600 MG 12 hr tablet Take 1 tablet (600 mg total) by mouth 2 (two) times daily as needed for cough or to loosen phlegm. (Patient not taking: Reported on 05/15/2016) 14 tablet 0 Not Taking at Unknown time  . ipratropium (ATROVENT) 0.03 % nasal spray Place 2 sprays into both nostrils 2 (two) times daily. Do not use for more than 5days. (Patient not taking: Reported on 05/15/2016) 30 mL 0 Completed Course at Unknown time  . ondansetron (ZOFRAN) 4 MG tablet Take 1 tablet (4 mg total)  by mouth every 8 (eight) hours as needed for nausea or vomiting. 20 tablet 0 Unknown at Unknown  . oseltamivir (TAMIFLU) 75 MG capsule Take 1 capsule (75 mg total) by mouth 2 (two) times daily. (Patient not taking: Reported on 05/15/2016) 10 capsule 0 Completed Course at Unknown time  . pantoprazole (PROTONIX) 20 MG tablet Take 1 tablet (20 mg total) by mouth daily. (Patient not taking: Reported on 05/15/2016) 90 tablet 1 Not Taking at Unknown time  . predniSONE (DELTASONE) 20 MG tablet Take 2 tablets (40 mg total) by mouth daily with breakfast. (Patient not taking: Reported on 05/15/2016) 10 tablet 0 Completed Course at Unknown time  . PROAIR HFA 108 (90 Base) MCG/ACT inhaler INHALE TWO PUFFS BY MOUTH EVERY 6 HOURS AS NEEDED FOR WHEEZING OR SHORTNESS OF BREATH 18 g 0 Unknown at [facility]    Inpatient Medications:  . aspirin EC  81 mg Oral Daily  . aspirin EC  81 mg Oral Daily  . chlorhexidine  60 mL Topical Once  . chlorhexidine  60 mL Topical Once  . furosemide  20 mg Oral Daily  . gabapentin  100 mg Oral QHS  . gentamicin irrigation  80 mg Irrigation On Call  . levothyroxine  88 mcg Oral QAC breakfast  . pantoprazole  20 mg Oral Daily  . sodium chloride flush  3 mL Intravenous Q12H  . vancomycin  1,000 mg Intravenous On Call    Allergies:  Allergies  Allergen Reactions  . Adhesive [Tape] Itching, Dermatitis, Rash and Other (See Comments)    Blisters and "skin bubbles"  . Atorvastatin Other (See Comments)    REACTION: Reaction not known  . Clindamycin Other (See Comments)    Unknown  . Codeine Other (See Comments)    Unknown  . Latex Other (See Comments)    Unknown  . Penicillins Other (See Comments)    Unknown  . Simvastatin Other (See Comments)    REACTION: fatigue    Social History   Social History  . Marital status: Widowed    Spouse name: N/A  . Number of children: 4  . Years of education: N/A   Occupational History  . retired    Social History Main Topics  .  Smoking status: Never Smoker  . Smokeless tobacco: Never Used  . Alcohol use No  . Drug use: No  . Sexual activity: Not on file   Other Topics Concern  . Not on file   Social History Narrative  . No narrative on file     Family History  Problem Relation Age of Onset  . Coronary artery disease      family hx of 1st degree relative <50  . Diabetes      family hx of  . Other  cardiovascular disorder family hx of  . Other      neurological disorder family hx of  . Other      respiratory disease family hx of  . Heart attack Daughter   . Heart Problems      all children  . Sudden death Son   . Heart disease Brother   . Heart disease Sister   . Colon cancer Neg Hx   . Colon polyps Neg Hx      Review of Systems: All other systems reviewed and are otherwise negative except as noted above.  Physical Exam: Vitals:   05/14/16 1915 05/14/16 1948 05/14/16 2100 05/15/16 0500  BP: (!) 127/52 140/59 (!) 133/50   Pulse: (!) 30 (!) 38 (!) 30 (!) 28  Resp: 17 13  18   Temp:   97.4 F (36.3 C) 98 F (36.7 C)  TempSrc:   Oral Oral  SpO2: 100% 95% 100% 97%  Weight:    180 lb 12.8 oz (82 kg)  Height:        GEN- The patient is well appearing, alert and oriented x 3 today.   HEENT: normocephalic, atraumatic; sclera clear, conjunctiva pink; hearing intact; oropharynx clear; neck supple  Lungs- Clear to ausculation bilaterally, normal work of breathing.  No wheezes, rales, rhonchi Heart- Bradycardic regular rate and rhythm  GI- soft, non-tender, non-distended, bowel sounds present Extremities- no clubbing, cyanosis, or edema MS- no significant deformity or atrophy Skin- warm and dry, no rash or lesion Psych- euthymic mood, full affect Neuro- strength and sensation are intact  Labs:   Lab Results  Component Value Date   WBC 9.5 05/15/2016   HGB 12.6 05/15/2016   HCT 38.5 05/15/2016   MCV 95.3 05/15/2016   PLT 191 05/15/2016    Recent Labs Lab 05/14/16 1718   05/15/16 0327  NA 133*  --  139  K 4.5  --  4.8  CL 103  --  103  CO2 20*  --  26  BUN 25*  --  23*  CREATININE 1.16*  < > 1.12*  CALCIUM 9.2  --  9.2  PROT 7.3  --   --   BILITOT 0.8  --   --   ALKPHOS 60  --   --   ALT 34  --   --   AST 39  --   --   GLUCOSE 165*  --  111*  < > = values in this interval not displayed.    Radiology/Studies: Dg Chest Port 1 View Result Date: 05/14/2016 CLINICAL DATA:  Increased shortness of breath with weakness EXAM: PORTABLE CHEST 1 VIEW COMPARISON:  03/16/2016 FINDINGS: There is mild cardiomegaly without overt failure. No consolidation or pleural effusion. No pneumothorax. Atherosclerosis. Stable calcified lung nodules consistent with granuloma. IMPRESSION: Mild cardiomegaly without overt failure Electronically Signed   By: Donavan Foil M.D.   On: 05/14/2016 18:02    STM:[id number] rhythm with complete heart block, RBBB Previously sinus rhythm with LBBB  TELEMETRY: complete heart block, ventricular rates 20-30  Assessment/Plan: 1.  Complete heart block The patient presented with symptomatic complete heart block.  She is on Bisoprolol at home with last dose yesterday morning  Pacemaker implant is recommended at this time.  Risks, benefits to pacemaker implantation reviewed with patient and family who wish to proceed. Will plan for later today  2.  HTN Stable No change required today  3.  Acute on chronic diastolic heart failure Should improve with pacing  Signed, Chanetta Marshall, NP 05/15/2016 10:01 AM    I have seen, examined the patient, and reviewed the above assessment and plan. On exam, bradycardic rhythm.  Changes to above are made where necessary.  The patient has symptomatic AV block which is not improving off of bisoprolol.  She has a h/o advanced conduction system disease which is likely degenerative in nature.  I do not feel that her conduction will normalize at this point.  I would therefore recommend pacemaker implantation at  this time.  Risks, benefits, alternatives to pacemaker implantation were discussed in detail with the patient and her family today. The patient and family understand that the risks include but are not limited to bleeding, infection, pneumothorax, perforation, tamponade, vascular damage, renal failure, MI, stroke, death,  and lead dislodgement and wishes to proceed. We will therefore schedule the procedure at the next available time.   Co Sign: [name], MD 05/15/2016 10:10 AM

## 2016-05-15 NOTE — Plan of Care (Signed)
Problem: Cardiac: Goal: Ability to achieve and maintain adequate cardiopulmonary perfusion will improve Outcome: Progressing Pt following arm restrictions, AV pacing at 60, cont to monitor. Site stable  Problem: Health Behavior/Discharge Planning: Goal: Ability to safely manage health related needs after discharge will improve Outcome: Progressing Began teaching on discharge instructions and follow up

## 2016-05-16 ENCOUNTER — Inpatient Hospital Stay (HOSPITAL_COMMUNITY): Payer: Medicare Other

## 2016-05-16 ENCOUNTER — Telehealth: Payer: Self-pay | Admitting: Internal Medicine

## 2016-05-16 ENCOUNTER — Telehealth: Payer: Self-pay | Admitting: *Deleted

## 2016-05-16 LAB — BASIC METABOLIC PANEL
Anion gap: 7 (ref 5–15)
BUN: 23 mg/dL — ABNORMAL HIGH (ref 6–20)
CHLORIDE: 105 mmol/L (ref 101–111)
CO2: 24 mmol/L (ref 22–32)
Calcium: 8.8 mg/dL — ABNORMAL LOW (ref 8.9–10.3)
Creatinine, Ser: 0.86 mg/dL (ref 0.44–1.00)
GFR calc Af Amer: >60 mL/min (ref >60)
GFR calc non Af Amer: 58 mL/min — ABNORMAL LOW (ref >60)
Glucose, Bld: 125 mg/dL — ABNORMAL HIGH (ref 65–99)
POTASSIUM: 4.1 mmol/L (ref 3.5–5.1)
SODIUM: 136 mmol/L (ref 135–145)

## 2016-05-16 MED ORDER — HYDROCORTISONE 1 % EX CREA
TOPICAL_CREAM | Freq: Three times a day (TID) | CUTANEOUS | Status: DC | PRN
  Administered 2016-05-16: 10:00:00 via TOPICAL
  Filled 2016-05-16: qty 28

## 2016-05-16 MED ORDER — FUROSEMIDE 20 MG PO TABS: 20.0000 mg | ORAL_TABLET | ORAL | Status: DC

## 2016-05-16 NOTE — Plan of Care (Signed)
Problem: Health Behavior/Discharge Planning: Goal: Ability to safely manage health related needs after discharge will improve Outcome: Completed/Met Date Met: 05/16/16 Pt & family educated on pt's f/u appointments as well as her arm activity. Pt educated on when she could lift her left arm, site care, when she could shower as well as when she could start back driving.

## 2016-05-16 NOTE — Plan of Care (Signed)
Problem: Health Behavior/Discharge Planning: Goal: Ability to safely manage health-related needs after discharge will improve Outcome: Completed/Met Date Met: 05/16/16 Pt educated on discharge medications, follow-up appointments, pacer mgt & activity limitations. All questions were answered with the pt as well as her 2 children.   Problem: Activity: Goal: Risk for activity intolerance will decrease Outcome: Completed/Met Date Met: 05/16/16 Pt & family both educated in depth about pt's activity limitations & when she could resume things.   Problem: Fluid Volume: Goal: Ability to maintain a balanced intake and output will improve Outcome: Completed/Met Date Met: 05/16/16 Pt & family both educated on the importance of fluid balance & daily weights with heart failure.

## 2016-05-16 NOTE — Progress Notes (Signed)
Doing well s/p  PPM  CXR reveals stable leads, no ptx Device interrogation is reviewed and normal  Routine wound care and follow-up   DC to home today Resume home medicines  [name] MD, [facility] 05/16/2016 9:17 AM

## 2016-05-16 NOTE — Telephone Encounter (Signed)
Patient has just been discharged from the hospital.  Patient needs post hospital fu.  Would also like to know if patient is appropriate for [facility].

## 2016-05-16 NOTE — Telephone Encounter (Signed)
Pt was on TCM list admitted 05/14/16 for Symptomatic complete heart block status post pacemaker implantation. Pt was D/c 3/14, and will f/u w/cardiology on 05/30/16...Beverly Kim

## 2016-05-16 NOTE — Telephone Encounter (Signed)
Called pt no answer will call tomorrow to set-up TCM appt...Beverly Kim

## 2016-05-16 NOTE — Consult Note (Signed)
[facility] CM Primary Care Navigator  05/16/2016  Beverly Kim May 11, 1924 [id number]   Went to see patient at the bedside to identify possible discharge needs but she was just discharged home per staff.  Primary care provider's office called (Tammy) to notify of patient's discharge and need for post hospital follow-up and transition of care.   Made aware to refer patient to [facility] care management if deemed appropriate for services.  For additional questions please contact:  Karin Golden A. Derrel Moore, BSN, RN-BC [facility] PRIMARY CARE Navigator Cell: [phone]

## 2016-05-16 NOTE — Plan of Care (Signed)
Problem: Pain Managment: Goal: General experience of comfort will improve Outcome: Completed/Met Date Met: 05/16/16 Pt educated on the pain or discomfort that she may experience due to her procedure. Pt aware of the options she has for pain mgmt as well as when to call RN or MD in regards to this matter.  Problem: Physical Regulation: Goal: Ability to maintain clinical measurements within normal limits will improve Outcome: Progressing Pt educated on normal limits for LA use post cath. Will go over again with pt & family upon discharge.

## 2016-05-17 MED FILL — Sodium Chloride Irrigation Soln 0.9%: Qty: 500 | Status: AC

## 2016-05-17 MED FILL — Gentamicin Sulfate Inj 40 MG/ML: INTRAMUSCULAR | Qty: 2 | Status: AC

## 2016-05-18 NOTE — Telephone Encounter (Signed)
Called pt again still no answer LMOM RTC to make hosp f/u appt...Beverly Kim

## 2016-05-21 NOTE — Telephone Encounter (Signed)
Pt called to make hosp f/u appt completed TCM call below.../lmb  Transition Care Management Follow-up Telephone Call   Date discharged? 05/16/16  How have you been since you were released from the hospital? Pt states she is doing ok   Do you understand why you were in the hospital? YES   Do you understand the discharge instructions? YES  Where were you discharged to? Home  Items Reviewed:  Medications reviewed: YES  Allergies reviewed: YES  Dietary changes reviewed: NO  Referrals reviewed: YES, already have appt to see cardiology on 05/30/16   Functional Questionnaire:   Activities of Daily Living (ADLs):   She states she are independent in the following: ambulation, bathing and hygiene, feeding, continence, grooming, toileting and dressing States she require assistance with the following: ambulation   Any transportation issues/concerns?: NO   Any patient concerns? NO   Confirmed importance and date/time of follow-up visits scheduled YES, appt 05/29/16  Provider Appointment booked with Dr. Quay Burow  Confirmed with patient if condition begins to worsen call PCP or go to the ER.  Patient was given the office number and encouraged to call back with question or concerns.  : YES

## 2016-05-27 NOTE — Progress Notes (Signed)
Subjective:    Patient ID: Beverly Kim, female    DOB: 04-06-1924, 83 y.o.   MRN: [id number]  HPI The patient is here for follow up from the hospital.  She was admitted 05/14/16 - 05/16/16.   She went to the ED for worsening SOB, fatigue and dizziness for two weeks.  She was not able to perform her ADL's.  She was in complete heart block in the ED and Dr Rayann Heman saw her and recommended a pacemaker.  She had it implanted and was monitored on telemetry.  CXR after wards was normal.  Device was functioning normally.    She has non obstructive CAD, hypothyroidism, chronic diastolic dysfunction, Htn, LBBB - these were stable in her hospitalization.    Walking into the office her legs felt weak and she feels SOB.  These are chronic problems that are not related to the pacemaker.  She has seen pulmonary in the past.  She feels like her esophagus is swollen and she feels like that is affecting her breathing.  She denies frequent GERD.  GERD:  She does not take the Protonix daily.  If she avoid large meals and certain foods she does not have GERD.  She does not feel GERD often.   She is not active and does not exercise regularly.    Medications and allergies reviewed with patient and updated if appropriate.  Patient Active Problem List   Diagnosis Date Noted  . Complete heart block ([place]) 05/14/2016  . Bradycardia 05/14/2016  . Wheezing 03/20/2016  . Influenza with respiratory manifestation 03/20/2016  . Acute non-recurrent maxillary sinusitis 03/20/2016  . Prediabetes 01/05/2016  . Hyperglycemia 01/04/2016  . Degenerative disc disease, cervical 08/23/2015  . Degenerative disc disease, lumbar 08/23/2015  . Left rotator cuff tear arthropathy 07/12/2015  . Left shoulder pain 04/06/2015  . Frozen shoulder 01/03/2015  . Cough variant asthma 12/08/2013  . Upper airway cough syndrome 10/27/2013  . Dyspnea 02/03/2013  . GERD 07/06/2009  . HIATAL HERNIA 07/06/2009  . DIVERTICULOSIS, COLON  07/06/2009  . HEPATIC CYST 07/06/2009  . COLONIC POLYPS, HX OF 07/06/2009  . MIXED HEARING LOSS BILATERAL 12/13/2008  . Essential hypertension 06/19/2008  . MITRAL VALVE PROLAPSE 06/19/2008  . LBBB (left bundle branch block) 06/19/2008  . Hyperlipidemia 08/12/2007  . Hypothyroidism 12/02/2006  . Coronary atherosclerosis 12/02/2006  . Chronic diastolic heart failure ([place]) 12/02/2006    Current Outpatient Prescriptions on File Prior to Visit  Medication Sig Dispense Refill  . aspirin 81 MG tablet Take 81 mg by mouth daily.    Marland Kitchen BIOTIN PO Take 1 tablet by mouth daily.    . bisoprolol (ZEBETA) 10 MG tablet TAKE ONE TABLET BY MOUTH ONCE DAILY 30 tablet 0  . Cholecalciferol (VITAMIN D3 PO) Take 2 capsules by mouth daily.    . furosemide (LASIX) 20 MG tablet Take 1 tablet (20 mg total) by mouth every Monday, Wednesday, and Friday.    Marland Kitchen ipratropium (ATROVENT) 0.03 % nasal spray Place 2 sprays into both nostrils 2 (two) times daily. Do not use for more than 5days. 30 mL 0  . Multiple Vitamins-Minerals (CENTRUM SILVER PO) Take 1 tablet by mouth daily.    . Omega-3 Fatty Acids (FISH OIL PO) Take 1 capsule by mouth daily.    . pantoprazole (PROTONIX) 20 MG tablet Take 1 tablet (20 mg total) by mouth daily. 90 tablet 1  . PROAIR HFA 108 (90 Base) MCG/ACT inhaler INHALE TWO PUFFS BY MOUTH EVERY 6  HOURS AS NEEDED FOR WHEEZING OR SHORTNESS OF BREATH 18 g 0  . SYNTHROID 88 MCG tablet Take 1 tablet (88 mcg total) by mouth daily before breakfast. 90 tablet 0  . vitamin C (ASCORBIC ACID) 500 MG tablet Take 500 mg by mouth daily.    Marland Kitchen gabapentin (NEURONTIN) 100 MG capsule Take 1 capsule (100 mg total) by mouth at bedtime. (Patient not taking: Reported on 05/15/2016) 30 capsule 3  . guaiFENesin (MUCINEX) 600 MG 12 hr tablet Take 1 tablet (600 mg total) by mouth 2 (two) times daily as needed for cough or to loosen phlegm. (Patient not taking: Reported on 05/15/2016) 14 tablet 0  . ondansetron (ZOFRAN) 4 MG  tablet Take 1 tablet (4 mg total) by mouth every 8 (eight) hours as needed for nausea or vomiting. (Patient not taking: Reported on 05/29/2016) 20 tablet 0   No current facility-administered medications on file prior to visit.     Past Medical History:  Diagnosis Date  . CHF (congestive heart failure) ([place])   . Colon polyps   . Coronary artery disease   . Diverticulosis   . Dyspnea   . GERD (gastroesophageal reflux disease)   . Hearing loss of both ears   . Hepatic cyst   . Hiatal hernia   . Hyperlipidemia   . Hypertension   . Hypothyroidism   . Low back pain   . Mitral valve disorders(424.0)   . Other left bundle branch block   . Palpitations   . URI (upper respiratory infection)     Past Surgical History:  Procedure Laterality Date  . CARDIAC CATHETERIZATION  04-18-01  . COLONOSCOPY    . HERNIA REPAIR    . PACEMAKER IMPLANT Left 05/15/2016   Procedure: Pacemaker Implant;  Surgeon: [name], MD;  Location: [facility];  Service: Cardiovascular;  Laterality: Left;  . THYROIDECTOMY    . TONSILLECTOMY AND ADENOIDECTOMY    . TOTAL ABDOMINAL HYSTERECTOMY W/ BILATERAL SALPINGOOPHORECTOMY      Social History   Social History  . Marital status: Widowed    Spouse name: N/A  . Number of children: 4  . Years of education: N/A   Occupational History  . retired    Social History Main Topics  . Smoking status: Never Smoker  . Smokeless tobacco: Never Used  . Alcohol use No  . Drug use: No  . Sexual activity: Not on file   Other Topics Concern  . Not on file   Social History Narrative  . No narrative on file    Family History  Problem Relation Age of Onset  . Coronary artery disease      family hx of 1st degree relative <50  . Diabetes      family hx of  . Other      cardiovascular disorder family hx of  . Other      neurological disorder family hx of  . Other      respiratory disease family hx of  . Heart attack Daughter   . Heart Problems       all children  . Sudden death Son   . Heart disease Brother   . Heart disease Sister   . Colon cancer Neg Hx   . Colon polyps Neg Hx     Review of Systems  Constitutional: Positive for chills (occ down back). Negative for fever.  Respiratory: Positive for cough (occ, dry) and shortness of breath (chronic - at rest and with exertion).  Negative for wheezing.   Cardiovascular: Negative for chest pain, palpitations and leg swelling.  Gastrointestinal:       Occ GERD  Musculoskeletal: Positive for back pain.  Neurological: Positive for headaches (occ, mild). Negative for light-headedness.       Objective:   Vitals:   05/29/16 1611  BP: 138/62  Pulse: 67  Temp: 98 F (36.7 C)   Wt Readings from Last 3 Encounters:  05/29/16 178 lb 1.9 oz (80.8 kg)  05/16/16 180 lb 4.8 oz (81.8 kg)  03/20/16 181 lb (82.1 kg)   Body mass index is 32.58 kg/m.   Physical Exam    Constitutional: Appears well-developed and well-nourished. No distress.  HENT:  Head: Normocephalic and atraumatic.  Neck: Neck supple. No tracheal deviation present. No thyromegaly present.  No cervical lymphadenopathy Cardiovascular: Normal rate, regular rhythm and normal heart sounds.   No murmur heard. No carotid bruit .  Trace edema LLE Pulmonary/Chest: Effort normal and breath sounds normal. No respiratory distress. No has no wheezes. No rales. site of PPM healing well - minimal ecchymosis, no pain, no swelling Skin: Skin is warm and dry. Not diaphoretic.  Psychiatric: Normal mood and affect. Behavior is normal.      Assessment & Plan:    See Problem List for Assessment and Plan of chronic medical problems.

## 2016-05-29 ENCOUNTER — Encounter: Payer: Self-pay | Admitting: Internal Medicine

## 2016-05-29 ENCOUNTER — Ambulatory Visit (INDEPENDENT_AMBULATORY_CARE_PROVIDER_SITE_OTHER): Payer: Medicare Other | Admitting: Internal Medicine

## 2016-05-29 VITALS — BP 138/62 | HR 67 | Temp 98.0°F | Ht 62.0 in | Wt 178.1 lb

## 2016-05-29 DIAGNOSIS — R0600 Dyspnea, unspecified: Secondary | ICD-10-CM

## 2016-05-29 DIAGNOSIS — I442 Atrioventricular block, complete: Secondary | ICD-10-CM

## 2016-05-29 DIAGNOSIS — K219 Gastro-esophageal reflux disease without esophagitis: Secondary | ICD-10-CM | POA: Diagnosis not present

## 2016-05-29 DIAGNOSIS — I10 Essential (primary) hypertension: Secondary | ICD-10-CM | POA: Diagnosis not present

## 2016-05-29 DIAGNOSIS — E038 Other specified hypothyroidism: Secondary | ICD-10-CM

## 2016-05-29 MED ORDER — BISOPROLOL FUMARATE 10 MG PO TABS: 10.0000 mg | ORAL_TABLET | Freq: Every day | ORAL | 0 refills | Status: DC

## 2016-05-29 NOTE — Assessment & Plan Note (Signed)
BP well controlled Current regimen effective and well tolerated Continue current medications at current doses  

## 2016-05-29 NOTE — Assessment & Plan Note (Signed)
Chronic in nature, not related to CHB / PPM Has seen pulmonary in past, following with cardiology Will follow up with Dr Percival Spanish Consider referral back to pulmonary - she wants to see cardiology first Increase exercise - her deconditioning and weight are likely contributing Start protonix daily to eliminate GERD as a cause

## 2016-05-29 NOTE — Assessment & Plan Note (Signed)
Lab Results  Component Value Date   TSH 1.398 05/14/2016   Continue current dose of medication

## 2016-05-29 NOTE — Progress Notes (Signed)
Pre visit review using our clinic review tool, if applicable. No additional management support is needed unless otherwise documented below in the visit note. 

## 2016-05-29 NOTE — Patient Instructions (Addendum)
  All other Health Maintenance issues reviewed.   All recommended immunizations and age-appropriate screenings are up-to-date or discussed.  No immunizations administered today.   Medications reviewed and updated.  No changes recommended at this time.  Your prescription(s) have been submitted to your pharmacy. Please take as directed and contact our office if you believe you are having problem(s) with the medication(s).   Follow up in May.

## 2016-05-29 NOTE — Assessment & Plan Note (Signed)
?   Well controlled ? Silent GERD Start taking protonix daily

## 2016-05-30 ENCOUNTER — Ambulatory Visit: Payer: Medicare Other

## 2016-06-01 ENCOUNTER — Ambulatory Visit (INDEPENDENT_AMBULATORY_CARE_PROVIDER_SITE_OTHER): Payer: Medicare Other | Admitting: *Deleted

## 2016-06-01 DIAGNOSIS — I442 Atrioventricular block, complete: Secondary | ICD-10-CM | POA: Diagnosis not present

## 2016-06-01 LAB — CUP PACEART INCLINIC DEVICE CHECK
Battery Voltage: 3.05 V
Brady Statistic RA Percent Paced: 77 %
Brady Statistic RV Percent Paced: 97 %
Date Time Interrogation Session: 20180330155330
Implantable Lead Implant Date: 20180313
Implantable Lead Implant Date: 20180313
Implantable Lead Location: 753859
Lead Channel Pacing Threshold Amplitude: 0.5 V
Lead Channel Pacing Threshold Pulse Width: 0.5 ms
Lead Channel Pacing Threshold Pulse Width: 0.5 ms
MDC IDC LEAD LOCATION: 753860
MDC IDC MSMT LEADCHNL RA IMPEDANCE VALUE: 462.5 Ohm
MDC IDC MSMT LEADCHNL RA PACING THRESHOLD AMPLITUDE: 0.5 V
MDC IDC MSMT LEADCHNL RA SENSING INTR AMPL: 2.8 mV
MDC IDC MSMT LEADCHNL RV IMPEDANCE VALUE: 537.5 Ohm
MDC IDC MSMT LEADCHNL RV PACING THRESHOLD AMPLITUDE: 0.75 V
MDC IDC MSMT LEADCHNL RV PACING THRESHOLD AMPLITUDE: 0.75 V
MDC IDC MSMT LEADCHNL RV PACING THRESHOLD PULSEWIDTH: 0.5 ms
MDC IDC MSMT LEADCHNL RV PACING THRESHOLD PULSEWIDTH: 0.5 ms
MDC IDC PG IMPLANT DT: 20180313
MDC IDC SET LEADCHNL RA PACING AMPLITUDE: 1.75 V
MDC IDC SET LEADCHNL RV PACING AMPLITUDE: 0.875
MDC IDC SET LEADCHNL RV PACING PULSEWIDTH: 0.5 ms
MDC IDC SET LEADCHNL RV SENSING SENSITIVITY: 4 mV
Pulse Gen Model: 2272
Pulse Gen Serial Number: 8005625

## 2016-06-01 NOTE — Progress Notes (Signed)
Wound check appointment. Steri-strips removed. Wound without redness or edema. Incision edges approximated, wound well healed. Normal device function. Thresholds, sensing, and impedances consistent with implant measurements. RA/RV auto capture programmed on at implant. Histogram distribution appropriate for patient and level of activity. No mode switches or high ventricular rates noted. Patient educated about wound care, arm mobility, lifting restrictions. ROV in 3 months with JA.

## 2016-06-12 ENCOUNTER — Encounter: Payer: Self-pay | Admitting: Cardiology

## 2016-06-12 NOTE — Progress Notes (Signed)
HPI The patient presents for evaluation of CHB status post recent admission.  She has a past history of nonobstructive coronary disease with a 70% LAD stenosis in 2003. In 2013 she had an echo which was low normal LV function but mild mitral regurgitation. In November of 2013 she had a stress perfusion study with no evidence of ischemia or infarct. She has had some suggestion of diastolic dysfunction on echo. She has had pulmonary function tests which demonstrated normal lung volumes and only a mildly reduced diffusion capacity.  EF on echo in 2015 was mildly reduced at 45 - 50%.   Since I last saw her she was in the hospital with CHB.  I reviewed these records for this visit.  She presented in mid March with fatigue and dyspnea and was found to have heart block.   Because of the previous reduced EF she was given CRT.     Since discharge from the hospital she's been fatigued and she still had some shortness of breath with activities. This is not her baseline. She's not describing PND or orthopnea. She's not been having any palpitations. She's had no chest pressure, neck or arm discomfort. She's had no weight gain or edema.  She wants to drive to [facility] in [place].    Allergies  Allergen Reactions  . Adhesive [Tape] Itching, Dermatitis, Rash and Other (See Comments)    Blisters and "skin bubbles"  . Ace Inhibitors Cough  . Atorvastatin Other (See Comments)    REACTION: Reaction not known  . Clindamycin Other (See Comments)    Unknown  . Codeine Other (See Comments)    Unknown  . Latex Other (See Comments)    Unknown  . Penicillins Other (See Comments)    Unknown  . Simvastatin Other (See Comments)    REACTION: fatigue    Current Outpatient Prescriptions  Medication Sig Dispense Refill  . aspirin 81 MG tablet Take 81 mg by mouth daily.    Marland Kitchen BIOTIN PO Take 1 tablet by mouth daily.    . bisoprolol (ZEBETA) 10 MG tablet Take 1 tablet (10 mg total) by mouth daily. 90 tablet 0  .  Cholecalciferol (VITAMIN D3 PO) Take 2 capsules by mouth daily.    . furosemide (LASIX) 20 MG tablet Take 1 tablet (20 mg total) by mouth every Monday, Wednesday, and Friday.    Marland Kitchen ipratropium (ATROVENT) 0.03 % nasal spray Place 2 sprays into both nostrils 2 (two) times daily. Do not use for more than 5days. 30 mL 0  . Multiple Vitamins-Minerals (CENTRUM SILVER PO) Take 1 tablet by mouth daily.    . Omega-3 Fatty Acids (FISH OIL PO) Take 1 capsule by mouth daily.    . pantoprazole (PROTONIX) 20 MG tablet Take 1 tablet (20 mg total) by mouth daily. 90 tablet 1  . PROAIR HFA 108 (90 Base) MCG/ACT inhaler INHALE TWO PUFFS BY MOUTH EVERY 6 HOURS AS NEEDED FOR WHEEZING OR SHORTNESS OF BREATH 18 g 0  . SYNTHROID 88 MCG tablet Take 1 tablet (88 mcg total) by mouth daily before breakfast. 90 tablet 0  . vitamin C (ASCORBIC ACID) 500 MG tablet Take 500 mg by mouth daily.     No current facility-administered medications for this visit.     Past Medical History:  Diagnosis Date  . CHF (congestive heart failure) ([place])   . Colon polyps   . Coronary artery disease   . Diverticulosis   . Dyspnea   . GERD (  gastroesophageal reflux disease)   . Hearing loss of both ears   . Hepatic cyst   . Hiatal hernia   . Hyperlipidemia   . Hypertension   . Hypothyroidism   . Low back pain   . Mitral valve disorders(424.0)   . Other left bundle branch block   . Palpitations   . URI (upper respiratory infection)     Past Surgical History:  Procedure Laterality Date  . CARDIAC CATHETERIZATION  04-18-01  . COLONOSCOPY    . HERNIA REPAIR    . PACEMAKER IMPLANT Left 05/15/2016   Procedure: Pacemaker Implant;  Surgeon: [name], MD;  Location: [facility];  Service: Cardiovascular;  Laterality: Left;  . THYROIDECTOMY    . TONSILLECTOMY AND ADENOIDECTOMY    . TOTAL ABDOMINAL HYSTERECTOMY W/ BILATERAL SALPINGOOPHORECTOMY      ROS:    As stated in the HPI and negative for all other systems.  PHYSICAL  EXAM BP (!) 142/74   Pulse 76   Ht 5\' 2"  (1.575 m)   Wt 180 lb (81.6 kg)   BMI 32.92 kg/m  GENERAL:  Well appearing NECK:  No jugular venous distention, waveform within normal limits, carotid upstroke brisk and symmetric, no bruits, no thyromegaly LUNGS:  Clear to auscultation bilaterally BACK:  No CVA tenderness CHEST:  Well healed pacemaker site.   HEART:  PMI not displaced or sustained,S1 and S2 within normal limits, no S3, no S4, no clicks, no rubs, no murmurs ABD:  Flat, positive bowel sounds normal in frequency in pitch, no bruits, no rebound, no guarding, no midline pulsatile mass, no hepatomegaly, no splenomegaly EXT:  2 plus pulses throughout, no edema, no cyanosis no clubbing SKIN:   Unremarkable.   ASSESSMENT AND PLAN   MILD CARDIOMYOPATHY:   I will check a BNP given her dyspnea.    No change in meds today.  HTN:    BP is slightly elevated.    No change in therapy at this point however.   MR:  Moderate on echo in 2015.  She needs a repeat echocardiogram.  I will order this.    CHB S/P CRT:  This is to be followed by Dr. Rayann Heman in June.

## 2016-06-13 ENCOUNTER — Ambulatory Visit (INDEPENDENT_AMBULATORY_CARE_PROVIDER_SITE_OTHER): Payer: Medicare Other | Admitting: Cardiology

## 2016-06-13 ENCOUNTER — Encounter: Payer: Self-pay | Admitting: Cardiology

## 2016-06-13 VITALS — BP 142/74 | HR 76 | Ht 62.0 in | Wt 180.0 lb

## 2016-06-13 DIAGNOSIS — I340 Nonrheumatic mitral (valve) insufficiency: Secondary | ICD-10-CM | POA: Diagnosis not present

## 2016-06-13 DIAGNOSIS — I10 Essential (primary) hypertension: Secondary | ICD-10-CM

## 2016-06-13 DIAGNOSIS — R0602 Shortness of breath: Secondary | ICD-10-CM | POA: Diagnosis not present

## 2016-06-13 NOTE — Patient Instructions (Addendum)
Medication Instructions:  Continue current medications  Labwork: BNP Today  Testing/Procedures: Your physician has requested that you have an echocardiogram. Echocardiography is a painless test that uses sound waves to create images of your heart. It provides your doctor with information about the size and shape of your heart and how well your heart's chambers and valves are working. This procedure takes approximately one hour. There are no restrictions for this procedure.  Follow-Up: Your physician recommends that you schedule a follow-up appointment in: 1 Month   Any Other Special Instructions Will Be Listed Below (If Applicable).   If you need a refill on your cardiac medications before your next appointment, please call your pharmacy.

## 2016-06-14 LAB — BRAIN NATRIURETIC PEPTIDE: Brain Natriuretic Peptide: 291.4 pg/mL — ABNORMAL HIGH (ref <100)

## 2016-06-15 DIAGNOSIS — M25512 Pain in left shoulder: Secondary | ICD-10-CM | POA: Diagnosis not present

## 2016-06-15 DIAGNOSIS — M7502 Adhesive capsulitis of left shoulder: Secondary | ICD-10-CM | POA: Diagnosis not present

## 2016-06-15 DIAGNOSIS — G8929 Other chronic pain: Secondary | ICD-10-CM | POA: Diagnosis not present

## 2016-06-19 ENCOUNTER — Telehealth: Payer: Self-pay | Admitting: *Deleted

## 2016-06-19 DIAGNOSIS — Z79899 Other long term (current) drug therapy: Secondary | ICD-10-CM

## 2016-06-19 NOTE — Telephone Encounter (Signed)
Spoke with pt about her blood work, told her to take Lasix daily and I will be mailing a lab slip for her to get blood work in 2 week.. Pt voice understanding

## 2016-06-19 NOTE — Telephone Encounter (Signed)
-----   Message from Minus Breeding, MD sent at 06/14/2016  2:40 PM EDT ----- BNP is increased.  Please ask her to start taking the Lasix daily.  Check BMET in two weeks.  Call Ms. Destin with the results and send results to [name], MD

## 2016-06-21 ENCOUNTER — Telehealth: Payer: Self-pay | Admitting: Internal Medicine

## 2016-06-21 ENCOUNTER — Encounter: Payer: Self-pay | Admitting: Cardiology

## 2016-06-21 NOTE — Telephone Encounter (Signed)
Called and did a PAD screening on pt and her left foot has abnormal finding 0.17 Right foot 1.13

## 2016-06-23 NOTE — Telephone Encounter (Signed)
Call her - see if she would like a referral to a vascular doctor for further evaluation

## 2016-06-27 ENCOUNTER — Ambulatory Visit (HOSPITAL_COMMUNITY): Payer: Medicare Other | Attending: Cardiology

## 2016-06-27 ENCOUNTER — Other Ambulatory Visit: Payer: Self-pay

## 2016-06-27 DIAGNOSIS — I2510 Atherosclerotic heart disease of native coronary artery without angina pectoris: Secondary | ICD-10-CM | POA: Diagnosis not present

## 2016-06-27 DIAGNOSIS — I340 Nonrheumatic mitral (valve) insufficiency: Secondary | ICD-10-CM

## 2016-06-27 DIAGNOSIS — E785 Hyperlipidemia, unspecified: Secondary | ICD-10-CM | POA: Insufficient documentation

## 2016-06-27 DIAGNOSIS — I110 Hypertensive heart disease with heart failure: Secondary | ICD-10-CM | POA: Diagnosis not present

## 2016-06-27 DIAGNOSIS — I081 Rheumatic disorders of both mitral and tricuspid valves: Secondary | ICD-10-CM | POA: Insufficient documentation

## 2016-06-27 DIAGNOSIS — I509 Heart failure, unspecified: Secondary | ICD-10-CM | POA: Diagnosis not present

## 2016-06-27 DIAGNOSIS — Z6832 Body mass index (BMI) 32.0-32.9, adult: Secondary | ICD-10-CM | POA: Insufficient documentation

## 2016-06-27 DIAGNOSIS — E669 Obesity, unspecified: Secondary | ICD-10-CM | POA: Diagnosis not present

## 2016-06-27 HISTORY — PX: TRANSTHORACIC ECHOCARDIOGRAM: SHX275

## 2016-06-27 NOTE — Telephone Encounter (Signed)
Patient called back and states she would like to wait until she comes in for her appointment on May 1 for her 6 month fu to discuss this.

## 2016-06-27 NOTE — Telephone Encounter (Signed)
Ok

## 2016-06-27 NOTE — Telephone Encounter (Signed)
LVM for pt to call back and discuss.  

## 2016-06-28 DIAGNOSIS — M25512 Pain in left shoulder: Secondary | ICD-10-CM | POA: Diagnosis not present

## 2016-06-28 DIAGNOSIS — G8929 Other chronic pain: Secondary | ICD-10-CM | POA: Diagnosis not present

## 2016-07-01 NOTE — Progress Notes (Deleted)
Subjective:    Patient ID: Beverly Kim, female    DOB: [birth year], 83 y.o.   MRN: [id number]  HPI The patient is here for follow up.  Hypothyroidism:  She is taking her medication daily.  She denies any recent changes in energy or weight that are unexplained.   ?PAD:    GERD:  She is taking her medication daily as prescribed.  She denies any GERD symptoms and feels her GERD is well controlled.   CAD, chronic diastolic dysfunction, Hypertension: She is following with cardiology.  She is taking her medication daily. She is compliant with a low sodium diet.  She denies chest pain, palpitations, edema, shortness of breath and regular headaches. She is exercising regularly.  She does not monitor her blood pressure at home.     Medications and allergies reviewed with patient and updated if appropriate.  Patient Active Problem List   Diagnosis Date Noted  . Complete heart block ([place]) 05/14/2016  . Bradycardia 05/14/2016  . Wheezing 03/20/2016  . Prediabetes 01/05/2016  . Hyperglycemia 01/04/2016  . Degenerative disc disease, cervical 08/23/2015  . Degenerative disc disease, lumbar 08/23/2015  . Left rotator cuff tear arthropathy 07/12/2015  . Left shoulder pain 04/06/2015  . Frozen shoulder 01/03/2015  . Cough variant asthma 12/08/2013  . Upper airway cough syndrome 10/27/2013  . Dyspnea 02/03/2013  . GERD 07/06/2009  . HIATAL HERNIA 07/06/2009  . DIVERTICULOSIS, COLON 07/06/2009  . HEPATIC CYST 07/06/2009  . COLONIC POLYPS, HX OF 07/06/2009  . MIXED HEARING LOSS BILATERAL 12/13/2008  . Essential hypertension 06/19/2008  . MITRAL VALVE PROLAPSE 06/19/2008  . LBBB (left bundle branch block) 06/19/2008  . Hyperlipidemia 08/12/2007  . Hypothyroidism 12/02/2006  . Coronary atherosclerosis 12/02/2006  . Chronic diastolic heart failure ([place]) 12/02/2006    Current Outpatient Prescriptions on File Prior to Visit  Medication Sig Dispense Refill  . aspirin 81 MG tablet Take  81 mg by mouth daily.    Marland Kitchen BIOTIN PO Take 1 tablet by mouth daily.    . bisoprolol (ZEBETA) 10 MG tablet Take 1 tablet (10 mg total) by mouth daily. 90 tablet 0  . Cholecalciferol (VITAMIN D3 PO) Take 2 capsules by mouth daily.    . furosemide (LASIX) 20 MG tablet Take 1 tablet (20 mg total) by mouth every Monday, Wednesday, and Friday.    Marland Kitchen ipratropium (ATROVENT) 0.03 % nasal spray Place 2 sprays into both nostrils 2 (two) times daily. Do not use for more than 5days. 30 mL 0  . Multiple Vitamins-Minerals (CENTRUM SILVER PO) Take 1 tablet by mouth daily.    . Omega-3 Fatty Acids (FISH OIL PO) Take 1 capsule by mouth daily.    . pantoprazole (PROTONIX) 20 MG tablet Take 1 tablet (20 mg total) by mouth daily. 90 tablet 1  . PROAIR HFA 108 (90 Base) MCG/ACT inhaler INHALE TWO PUFFS BY MOUTH EVERY 6 HOURS AS NEEDED FOR WHEEZING OR SHORTNESS OF BREATH 18 g 0  . SYNTHROID 88 MCG tablet Take 1 tablet (88 mcg total) by mouth daily before breakfast. 90 tablet 0  . vitamin C (ASCORBIC ACID) 500 MG tablet Take 500 mg by mouth daily.     No current facility-administered medications on file prior to visit.     Past Medical History:  Diagnosis Date  . CHF (congestive heart failure) ([place])   . Colon polyps   . Coronary artery disease   . Diverticulosis   . Dyspnea   . GERD (gastroesophageal  reflux disease)   . Hearing loss of both ears   . Hepatic cyst   . Hiatal hernia   . Hyperlipidemia   . Hypertension   . Hypothyroidism   . Low back pain   . Mitral valve disorders(424.0)   . Other left bundle branch block   . Palpitations   . URI (upper respiratory infection)     Past Surgical History:  Procedure Laterality Date  . CARDIAC CATHETERIZATION  04-18-01  . COLONOSCOPY    . HERNIA REPAIR    . PACEMAKER IMPLANT Left 05/15/2016   Procedure: Pacemaker Implant;  Surgeon: [name], MD;  Location: [facility];  Service: Cardiovascular;  Laterality: Left;  . THYROIDECTOMY    .  TONSILLECTOMY AND ADENOIDECTOMY    . TOTAL ABDOMINAL HYSTERECTOMY W/ BILATERAL SALPINGOOPHORECTOMY      Social History   Social History  . Marital status: Widowed    Spouse name: N/A  . Number of children: 4  . Years of education: N/A   Occupational History  . retired    Social History Main Topics  . Smoking status: Never Smoker  . Smokeless tobacco: Never Used  . Alcohol use No  . Drug use: No  . Sexual activity: Not on file   Other Topics Concern  . Not on file   Social History Narrative  . No narrative on file    Family History  Problem Relation Age of Onset  . Coronary artery disease      family hx of 1st degree relative <50  . Diabetes      family hx of  . Other      cardiovascular disorder family hx of  . Other      neurological disorder family hx of  . Other      respiratory disease family hx of  . Heart attack Daughter   . Heart Problems      all children  . Sudden death Son   . Heart disease Brother   . Heart disease Sister   . Colon cancer Neg Hx   . Colon polyps Neg Hx     Review of Systems     Objective:  There were no vitals filed for this visit. Wt Readings from Last 3 Encounters:  06/13/16 180 lb (81.6 kg)  05/29/16 178 lb 1.9 oz (80.8 kg)  05/16/16 180 lb 4.8 oz (81.8 kg)   There is no height or weight on file to calculate BMI.   Physical Exam    Constitutional: Appears well-developed and well-nourished. No distress.  HENT:  Head: Normocephalic and atraumatic.  Neck: Neck supple. No tracheal deviation present. No thyromegaly present.  No cervical lymphadenopathy Cardiovascular: Normal rate, regular rhythm and normal heart sounds.   No murmur heard. No carotid bruit .  No edema Pulmonary/Chest: Effort normal and breath sounds normal. No respiratory distress. No has no wheezes. No rales.  Skin: Skin is warm and dry. Not diaphoretic.  Psychiatric: Normal mood and affect. Behavior is normal.      Assessment & Plan:    See  Problem List for Assessment and Plan of chronic medical problems.

## 2016-07-02 ENCOUNTER — Telehealth: Payer: Self-pay | Admitting: Cardiology

## 2016-07-02 DIAGNOSIS — Z79899 Other long term (current) drug therapy: Secondary | ICD-10-CM

## 2016-07-02 DIAGNOSIS — G8929 Other chronic pain: Secondary | ICD-10-CM | POA: Diagnosis not present

## 2016-07-02 DIAGNOSIS — M25512 Pain in left shoulder: Secondary | ICD-10-CM | POA: Diagnosis not present

## 2016-07-02 NOTE — Telephone Encounter (Signed)
Advised patient of results Lab scheduled at [facility] office   Notes recorded by Minus Breeding, MD on 07/01/2016 at 7:22 PM EDT MR is still mild. No change in therapy. Call Ms. Schriever with the results and send results to [name], MD

## 2016-07-02 NOTE — Telephone Encounter (Signed)
New message    Pt is returning call about her echo results.

## 2016-07-03 ENCOUNTER — Ambulatory Visit: Payer: Medicare Other | Admitting: Internal Medicine

## 2016-07-06 ENCOUNTER — Other Ambulatory Visit: Payer: Medicare Other | Admitting: *Deleted

## 2016-07-06 DIAGNOSIS — Z79899 Other long term (current) drug therapy: Secondary | ICD-10-CM

## 2016-07-06 DIAGNOSIS — M25512 Pain in left shoulder: Secondary | ICD-10-CM | POA: Diagnosis not present

## 2016-07-06 DIAGNOSIS — G8929 Other chronic pain: Secondary | ICD-10-CM | POA: Diagnosis not present

## 2016-07-07 LAB — BASIC METABOLIC PANEL
BUN/Creatinine Ratio: 23 (ref 12–28)
BUN: 23 mg/dL (ref 8–27)
CO2: 21 mmol/L (ref 18–29)
CREATININE: 0.98 mg/dL (ref 0.57–1.00)
Calcium: 10.1 mg/dL (ref 8.7–10.3)
Chloride: 98 mmol/L (ref 96–106)
GFR calc Af Amer: 59 mL/min/{1.73_m2} — ABNORMAL LOW (ref >59)
GFR, EST NON AFRICAN AMERICAN: 51 mL/min/{1.73_m2} — AB (ref >59)
GLUCOSE: 105 mg/dL — AB (ref 65–99)
Potassium: 5.6 mmol/L — ABNORMAL HIGH (ref 3.5–5.2)
SODIUM: 141 mmol/L (ref 134–144)

## 2016-07-09 DIAGNOSIS — M25512 Pain in left shoulder: Secondary | ICD-10-CM | POA: Diagnosis not present

## 2016-07-09 DIAGNOSIS — G8929 Other chronic pain: Secondary | ICD-10-CM | POA: Diagnosis not present

## 2016-07-10 NOTE — Progress Notes (Signed)
HPI The patient presents for evaluation of CHB status post recent admission.  She has a past history of nonobstructive coronary disease with a 70% LAD stenosis in 2003. In 2013 she had an echo which was low normal LV function but mild mitral regurgitation. In November of 2013 she had a stress perfusion study with no evidence of ischemia or infarct. She has had some suggestion of diastolic dysfunction on echo. She has had pulmonary function tests which demonstrated normal lung volumes and only a mildly reduced diffusion capacity.  EF on echo in 2015 was mildly reduced at 45 - 50%.  She presented in mid March with fatigue and dyspnea and was found to have heart block.   Because of the previous reduced EF she was given CRT.   At the last visit her BNP was mildly elevated and she was told to take increased Lasix.  She had an echo which demonstrated mild MR.   Since that visit she thinks that her breathing is slightly better.  She is walking up the driveway incline slightly better.  The patient denies any new symptoms such as chest discomfort, neck or arm discomfort. There has been no new shortness of breath, PND or orthopnea. There have been no reported palpitations, presyncope or syncope.  She is disappointed because she thought she would have more energy than she had before her heart block issue. She is clearly back to her previous baseline.  Allergies  Allergen Reactions  . Adhesive [Tape] Itching, Dermatitis, Rash and Other (See Comments)    Blisters and "skin bubbles"  . Ace Inhibitors Cough  . Atorvastatin Other (See Comments)    REACTION: Reaction not known  . Clindamycin Other (See Comments)    Unknown  . Codeine Other (See Comments)    Unknown  . Latex Other (See Comments)    Unknown  . Penicillins Other (See Comments)    Unknown  . Simvastatin Other (See Comments)    REACTION: fatigue    Current Outpatient Prescriptions  Medication Sig Dispense Refill  . aspirin 81 MG tablet  Take 81 mg by mouth daily.    Marland Kitchen BIOTIN PO Take 1 tablet by mouth daily.    . Cholecalciferol (VITAMIN D3 PO) Take 2 capsules by mouth daily.    . furosemide (LASIX) 20 MG tablet Take 1 tablet (20 mg total) by mouth every Monday, Wednesday, and Friday. (Patient taking differently: Take 20 mg by mouth daily. )    . ipratropium (ATROVENT) 0.03 % nasal spray Place 2 sprays into both nostrils 2 (two) times daily. Do not use for more than 5days. 30 mL 0  . Multiple Vitamins-Minerals (CENTRUM SILVER PO) Take 1 tablet by mouth daily.    . Omega-3 Fatty Acids (FISH OIL PO) Take 1 capsule by mouth daily.    . pantoprazole (PROTONIX) 20 MG tablet Take 1 tablet (20 mg total) by mouth daily. 90 tablet 1  . PROAIR HFA 108 (90 Base) MCG/ACT inhaler INHALE TWO PUFFS BY MOUTH EVERY 6 HOURS AS NEEDED FOR WHEEZING OR SHORTNESS OF BREATH 18 g 0  . vitamin C (ASCORBIC ACID) 500 MG tablet Take 500 mg by mouth daily.    . bisoprolol (ZEBETA) 10 MG tablet Take 1 tablet (10 mg total) by mouth daily. 90 tablet 1  . SYNTHROID 88 MCG tablet Take 1 tablet (88 mcg total) by mouth daily before breakfast. 90 tablet 1   No current facility-administered medications for this visit.     Past  Medical History:  Diagnosis Date  . CHF (congestive heart failure) ([place])   . Colon polyps   . Coronary artery disease   . Diverticulosis   . Dyspnea   . GERD (gastroesophageal reflux disease)   . Hearing loss of both ears   . Hepatic cyst   . Hiatal hernia   . Hyperlipidemia   . Hypertension   . Hypothyroidism   . Low back pain   . Mitral valve disorders(424.0)   . Other left bundle branch block   . Palpitations   . URI (upper respiratory infection)     Past Surgical History:  Procedure Laterality Date  . CARDIAC CATHETERIZATION  04-18-01  . COLONOSCOPY    . HERNIA REPAIR    . PACEMAKER IMPLANT Left 05/15/2016   Procedure: Pacemaker Implant;  Surgeon: [name], MD;  Location: [facility];  Service:  Cardiovascular;  Laterality: Left;  . THYROIDECTOMY    . TONSILLECTOMY AND ADENOIDECTOMY    . TOTAL ABDOMINAL HYSTERECTOMY W/ BILATERAL SALPINGOOPHORECTOMY      ROS:   As stated in the HPI and negative for all other systems.  PHYSICAL EXAM BP 120/62   Pulse 72   Ht 5\' 2"  (1.575 m)   Wt 177 lb (80.3 kg)   BMI 32.37 kg/m  GENERAL:  Well appearing NECK:  No jugular venous distention, waveform within normal limits, carotid upstroke brisk and symmetric, no bruits, no thyromegaly LUNGS:  Clear to auscultation bilaterally CHEST:  Unremarkable, healed pacer pocket.  HEART:  PMI not displaced or sustained,S1 and S2 within normal limits, no S3, no S4, no clicks, no rubs, no murmurs ABD:  Flat, positive bowel sounds normal in frequency in pitch, no bruits, no rebound, no guarding, no midline pulsatile mass, no hepatomegaly, no splenomegaly EXT:  2 plus pulses throughout, no edema, no cyanosis no clubbing    ASSESSMENT AND PLAN   MILD CARDIOMYOPATHY:   EF was normal with mild MR.  I did give her Lasix after the last appt since her BNP was elevated.   Today she will have no change in her meds.   HTN:    BP is normal.  She will continue on meds as listed.   MR:  Mild as above.  I will follow this clinically.    CHB S/P CRT:  This is to be followed by Dr. Rayann Heman in June.

## 2016-07-11 ENCOUNTER — Ambulatory Visit (INDEPENDENT_AMBULATORY_CARE_PROVIDER_SITE_OTHER): Payer: Medicare Other | Admitting: Cardiology

## 2016-07-11 ENCOUNTER — Encounter: Payer: Self-pay | Admitting: Cardiology

## 2016-07-11 VITALS — BP 120/62 | HR 72 | Ht 62.0 in | Wt 177.0 lb

## 2016-07-11 DIAGNOSIS — I10 Essential (primary) hypertension: Secondary | ICD-10-CM

## 2016-07-11 DIAGNOSIS — I340 Nonrheumatic mitral (valve) insufficiency: Secondary | ICD-10-CM

## 2016-07-11 NOTE — Patient Instructions (Signed)

## 2016-07-12 ENCOUNTER — Ambulatory Visit (INDEPENDENT_AMBULATORY_CARE_PROVIDER_SITE_OTHER): Payer: Medicare Other | Admitting: Internal Medicine

## 2016-07-12 ENCOUNTER — Encounter: Payer: Self-pay | Admitting: Cardiology

## 2016-07-12 ENCOUNTER — Encounter: Payer: Self-pay | Admitting: Internal Medicine

## 2016-07-12 VITALS — BP 136/62 | HR 76 | Temp 97.7°F | Resp 16 | Wt 179.0 lb

## 2016-07-12 DIAGNOSIS — R7303 Prediabetes: Secondary | ICD-10-CM | POA: Diagnosis not present

## 2016-07-12 DIAGNOSIS — E038 Other specified hypothyroidism: Secondary | ICD-10-CM

## 2016-07-12 DIAGNOSIS — K219 Gastro-esophageal reflux disease without esophagitis: Secondary | ICD-10-CM

## 2016-07-12 DIAGNOSIS — I10 Essential (primary) hypertension: Secondary | ICD-10-CM | POA: Diagnosis not present

## 2016-07-12 DIAGNOSIS — I340 Nonrheumatic mitral (valve) insufficiency: Secondary | ICD-10-CM | POA: Insufficient documentation

## 2016-07-12 MED ORDER — BISOPROLOL FUMARATE 10 MG PO TABS: 10.0000 mg | ORAL_TABLET | Freq: Every day | ORAL | 1 refills | Status: DC

## 2016-07-12 MED ORDER — SYNTHROID 88 MCG PO TABS: 88.0000 ug | ORAL_TABLET | Freq: Every day | ORAL | 1 refills | Status: DC

## 2016-07-12 NOTE — Assessment & Plan Note (Signed)
tsh normal two months ago Continue current dose of medication

## 2016-07-12 NOTE — Assessment & Plan Note (Signed)
BP well controlled Current regimen effective and well tolerated Continue current medications at current doses  

## 2016-07-12 NOTE — Progress Notes (Signed)
Subjective:    Patient ID: Beverly Kim, female    DOB: May 29, 1924, 83 y.o.   MRN: [id number]  HPI The patient is here for follow up.  Hypothyroidism:  She is taking her medication daily.  She denies any recent changes in energy or weight that are unexplained.   ?PAD:  She had a home visit and the nurse did a PAD screening.  Her right foot had a reading of 1.13 and the left foot was 0.17.  She does get cramps in her legs.  She walks a little.  She does get SOB and that limits her walking.  She wants to monitor her symtpoms.    GERD:  She is taking her medication most days, but occasionally forgets.  Her throat does feel better - she has been on the medication for about 6 weeks. She will try taking the medication in the evening.    CAD, chronic diastolic dysfunction, Hypertension: She is following with cardiology.  She is taking her medication daily. She is compliant with a low sodium diet.  She denies chest pain, palpitations, edema, and regular headaches. She is exercising regularly.  She does not monitor her blood pressure at home.     Medications and allergies reviewed with patient and updated if appropriate.  Patient Active Problem List   Diagnosis Date Noted  . Complete heart block ([place]) 05/14/2016  . Bradycardia 05/14/2016  . Wheezing 03/20/2016  . Prediabetes 01/05/2016  . Degenerative disc disease, cervical 08/23/2015  . Degenerative disc disease, lumbar 08/23/2015  . Left rotator cuff tear arthropathy 07/12/2015  . Left shoulder pain 04/06/2015  . Frozen shoulder 01/03/2015  . Cough variant asthma 12/08/2013  . Upper airway cough syndrome 10/27/2013  . Dyspnea 02/03/2013  . GERD 07/06/2009  . HIATAL HERNIA 07/06/2009  . DIVERTICULOSIS, COLON 07/06/2009  . HEPATIC CYST 07/06/2009  . COLONIC POLYPS, HX OF 07/06/2009  . MIXED HEARING LOSS BILATERAL 12/13/2008  . Essential hypertension 06/19/2008  . MITRAL VALVE PROLAPSE 06/19/2008  . LBBB (left bundle branch  block) 06/19/2008  . Hyperlipidemia 08/12/2007  . Hypothyroidism 12/02/2006  . Coronary atherosclerosis 12/02/2006  . Chronic diastolic heart failure ([place]) 12/02/2006    Current Outpatient Prescriptions on File Prior to Visit  Medication Sig Dispense Refill  . aspirin 81 MG tablet Take 81 mg by mouth daily.    Marland Kitchen BIOTIN PO Take 1 tablet by mouth daily.    . bisoprolol (ZEBETA) 10 MG tablet Take 1 tablet (10 mg total) by mouth daily. 90 tablet 0  . Cholecalciferol (VITAMIN D3 PO) Take 2 capsules by mouth daily.    . furosemide (LASIX) 20 MG tablet Take 1 tablet (20 mg total) by mouth every Monday, Wednesday, and Friday. (Patient taking differently: Take 20 mg by mouth daily. )    . ipratropium (ATROVENT) 0.03 % nasal spray Place 2 sprays into both nostrils 2 (two) times daily. Do not use for more than 5days. 30 mL 0  . Multiple Vitamins-Minerals (CENTRUM SILVER PO) Take 1 tablet by mouth daily.    . Omega-3 Fatty Acids (FISH OIL PO) Take 1 capsule by mouth daily.    . pantoprazole (PROTONIX) 20 MG tablet Take 1 tablet (20 mg total) by mouth daily. 90 tablet 1  . PROAIR HFA 108 (90 Base) MCG/ACT inhaler INHALE TWO PUFFS BY MOUTH EVERY 6 HOURS AS NEEDED FOR WHEEZING OR SHORTNESS OF BREATH 18 g 0  . SYNTHROID 88 MCG tablet Take 1 tablet (88 mcg total)  by mouth daily before breakfast. 90 tablet 0  . vitamin C (ASCORBIC ACID) 500 MG tablet Take 500 mg by mouth daily.     No current facility-administered medications on file prior to visit.     Past Medical History:  Diagnosis Date  . CHF (congestive heart failure) ([place])   . Colon polyps   . Coronary artery disease   . Diverticulosis   . Dyspnea   . GERD (gastroesophageal reflux disease)   . Hearing loss of both ears   . Hepatic cyst   . Hiatal hernia   . Hyperlipidemia   . Hypertension   . Hypothyroidism   . Low back pain   . Mitral valve disorders(424.0)   . Other left bundle branch block   . Palpitations   . URI (upper  respiratory infection)     Past Surgical History:  Procedure Laterality Date  . CARDIAC CATHETERIZATION  04-18-01  . COLONOSCOPY    . HERNIA REPAIR    . PACEMAKER IMPLANT Left 05/15/2016   Procedure: Pacemaker Implant;  Surgeon: [name], MD;  Location: [facility];  Service: Cardiovascular;  Laterality: Left;  . THYROIDECTOMY    . TONSILLECTOMY AND ADENOIDECTOMY    . TOTAL ABDOMINAL HYSTERECTOMY W/ BILATERAL SALPINGOOPHORECTOMY      Social History   Social History  . Marital status: Widowed    Spouse name: N/A  . Number of children: 4  . Years of education: N/A   Occupational History  . retired    Social History Main Topics  . Smoking status: Never Smoker  . Smokeless tobacco: Never Used  . Alcohol use No  . Drug use: No  . Sexual activity: Not on file   Other Topics Concern  . Not on file   Social History Narrative  . No narrative on file    Family History  Problem Relation Age of Onset  . Coronary artery disease Unknown        family hx of 1st degree relative <50  . Diabetes Unknown        family hx of  . Other Unknown        cardiovascular disorder family hx of  . Other Unknown        neurological disorder family hx of  . Other Unknown        respiratory disease family hx of  . Heart attack Daughter   . Heart Problems Unknown        all children  . Sudden death Son   . Heart disease Brother   . Heart disease Sister   . Colon cancer Neg Hx   . Colon polyps Neg Hx     Review of Systems  Constitutional: Negative for chills and fever.  Respiratory: Positive for cough (occ - tickle in throat) and shortness of breath (does not feel she is getting enough air - worse after eating or exercising). Negative for wheezing.   Neurological: Negative for light-headedness and headaches.       Objective:   Vitals:   07/12/16 0858  BP: 136/62  Pulse: 76  Resp: 16  Temp: 97.7 F (36.5 C)   Wt Readings from Last 3 Encounters:  07/12/16 179 lb  (81.2 kg)  07/11/16 177 lb (80.3 kg)  06/13/16 180 lb (81.6 kg)   Body mass index is 32.74 kg/m.   Physical Exam    Constitutional: Appears well-developed and well-nourished. No distress.  HENT:  Head: Normocephalic and atraumatic.  Neck: Neck  supple. No tracheal deviation present. No thyromegaly present.  No cervical lymphadenopathy Cardiovascular: Normal rate, regular rhythm and normal heart sounds.   No murmur heard. No carotid bruit .  No edema Pulmonary/Chest: Effort normal and breath sounds normal. No respiratory distress. No has no wheezes. No rales.  Skin: Skin is warm and dry. Not diaphoretic.  Psychiatric: Normal mood and affect. Behavior is normal.      Assessment & Plan:    See Problem List for Assessment and Plan of chronic medical problems.

## 2016-07-12 NOTE — Assessment & Plan Note (Signed)
GERD and throat better Continue protonix 20 mg daily - take in evening - if not controlled -- will increase to 40 mg daily

## 2016-07-12 NOTE — Assessment & Plan Note (Addendum)
Lab Results  Component Value Date   HGBA1C 6.2 01/04/2016   Prediabetic Walking - will try to start silver sneakers and increase walking Limit sugars/carbs Recheck at next visit

## 2016-07-12 NOTE — Patient Instructions (Addendum)
  Try using the albuterol before exercise.     Medications reviewed and updated.  No changes recommended at this time.  Your prescription(s) have been submitted to your pharmacy. Please take as directed and contact our office if you believe you are having problem(s) with the medication(s).   Please followup in 6 months; schedule a wellness visit with Sharee Pimple

## 2016-07-13 DIAGNOSIS — G8929 Other chronic pain: Secondary | ICD-10-CM | POA: Diagnosis not present

## 2016-07-13 DIAGNOSIS — M25512 Pain in left shoulder: Secondary | ICD-10-CM | POA: Diagnosis not present

## 2016-07-16 DIAGNOSIS — M7502 Adhesive capsulitis of left shoulder: Secondary | ICD-10-CM | POA: Diagnosis not present

## 2016-07-16 DIAGNOSIS — M25512 Pain in left shoulder: Secondary | ICD-10-CM | POA: Diagnosis not present

## 2016-07-16 DIAGNOSIS — G8929 Other chronic pain: Secondary | ICD-10-CM | POA: Diagnosis not present

## 2016-07-20 DIAGNOSIS — M25512 Pain in left shoulder: Secondary | ICD-10-CM | POA: Diagnosis not present

## 2016-07-20 DIAGNOSIS — G8929 Other chronic pain: Secondary | ICD-10-CM | POA: Diagnosis not present

## 2016-07-27 DIAGNOSIS — M25512 Pain in left shoulder: Secondary | ICD-10-CM | POA: Diagnosis not present

## 2016-07-27 DIAGNOSIS — G8929 Other chronic pain: Secondary | ICD-10-CM | POA: Diagnosis not present

## 2016-08-03 DIAGNOSIS — M25512 Pain in left shoulder: Secondary | ICD-10-CM | POA: Diagnosis not present

## 2016-08-03 DIAGNOSIS — G8929 Other chronic pain: Secondary | ICD-10-CM | POA: Diagnosis not present

## 2016-08-07 DIAGNOSIS — M25512 Pain in left shoulder: Secondary | ICD-10-CM | POA: Diagnosis not present

## 2016-08-07 DIAGNOSIS — M7502 Adhesive capsulitis of left shoulder: Secondary | ICD-10-CM | POA: Diagnosis not present

## 2016-08-07 DIAGNOSIS — M19012 Primary osteoarthritis, left shoulder: Secondary | ICD-10-CM | POA: Diagnosis not present

## 2016-08-07 DIAGNOSIS — G8929 Other chronic pain: Secondary | ICD-10-CM | POA: Diagnosis not present

## 2016-08-10 DIAGNOSIS — M25512 Pain in left shoulder: Secondary | ICD-10-CM | POA: Diagnosis not present

## 2016-08-10 DIAGNOSIS — G8929 Other chronic pain: Secondary | ICD-10-CM | POA: Diagnosis not present

## 2016-08-13 ENCOUNTER — Telehealth: Payer: Self-pay | Admitting: Internal Medicine

## 2016-08-13 NOTE — Telephone Encounter (Signed)
Pt called about a no show fee that she received for a missed appointment on 07/03/2016. She said that she had her calendar on April and had not flipped it to May yet. She did this on May 1st and saw the appointment and knew that she would not be able to make it to the appointment on time. She said that she called and went ahead and rescheduled the appointment as soon as she realized she would not make it. She wanted to know if the no show fee could be waved for this honest mistake. She can be reached at [phone] and we can leave a message if she is not at home.

## 2016-08-15 DIAGNOSIS — G8929 Other chronic pain: Secondary | ICD-10-CM | POA: Diagnosis not present

## 2016-08-15 DIAGNOSIS — M25512 Pain in left shoulder: Secondary | ICD-10-CM | POA: Diagnosis not present

## 2016-08-15 NOTE — Telephone Encounter (Signed)
Please let the patient know that we have already removed this charge from her account as a courtesy.

## 2016-08-16 ENCOUNTER — Encounter: Payer: Self-pay | Admitting: Internal Medicine

## 2016-08-16 ENCOUNTER — Ambulatory Visit (INDEPENDENT_AMBULATORY_CARE_PROVIDER_SITE_OTHER): Payer: Medicare Other | Admitting: Internal Medicine

## 2016-08-16 VITALS — BP 142/70 | HR 74 | Ht 62.0 in | Wt 180.0 lb

## 2016-08-16 DIAGNOSIS — I480 Paroxysmal atrial fibrillation: Secondary | ICD-10-CM

## 2016-08-16 DIAGNOSIS — I442 Atrioventricular block, complete: Secondary | ICD-10-CM | POA: Diagnosis not present

## 2016-08-16 HISTORY — DX: Paroxysmal atrial fibrillation: I48.0

## 2016-08-16 LAB — CUP PACEART INCLINIC DEVICE CHECK
Battery Remaining Longevity: 114 mo
Battery Voltage: 3.01 V
Brady Statistic RA Percent Paced: 65 %
Brady Statistic RV Percent Paced: 99 %
Date Time Interrogation Session: 20180614141536
Implantable Lead Implant Date: 20180313
Implantable Lead Implant Date: 20180313
Implantable Lead Location: 753859
Implantable Lead Location: 753860
Lead Channel Impedance Value: 625 Ohm
Lead Channel Pacing Threshold Amplitude: 1 V
Lead Channel Pacing Threshold Pulse Width: 0.5 ms
Lead Channel Pacing Threshold Pulse Width: 0.5 ms
Lead Channel Setting Pacing Amplitude: 1.25 V
Lead Channel Setting Pacing Pulse Width: 0.5 ms
Lead Channel Setting Sensing Sensitivity: 4 mV
MDC IDC MSMT LEADCHNL RA IMPEDANCE VALUE: 487.5 Ohm
MDC IDC MSMT LEADCHNL RA PACING THRESHOLD AMPLITUDE: 1 V
MDC IDC MSMT LEADCHNL RA SENSING INTR AMPL: 2.4 mV
MDC IDC MSMT LEADCHNL RV PACING THRESHOLD AMPLITUDE: 1 V
MDC IDC MSMT LEADCHNL RV PACING THRESHOLD AMPLITUDE: 1 V
MDC IDC MSMT LEADCHNL RV PACING THRESHOLD PULSEWIDTH: 0.5 ms
MDC IDC MSMT LEADCHNL RV PACING THRESHOLD PULSEWIDTH: 0.5 ms
MDC IDC MSMT LEADCHNL RV SENSING INTR AMPL: 12 mV
MDC IDC PG IMPLANT DT: 20180313
MDC IDC SET LEADCHNL RA PACING AMPLITUDE: 3.125
Pulse Gen Model: 2272
Pulse Gen Serial Number: 8005625

## 2016-08-16 NOTE — Telephone Encounter (Signed)
LM informing pt.

## 2016-08-16 NOTE — Progress Notes (Signed)
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 female who presents today for routine electrophysiology followup.  Since recent PPM implant,  the patient reports doing very well.  She has chronic SOB which is neither better nor worse since her PPM.  She denies procedure related complications and is pleased with results.  Today, she denies symptoms of palpitations, chest pain, shortness of breath,  lower extremity edema, dizziness, presyncope, or syncope.  The patient is otherwise without complaint today.   Past Medical History:  Diagnosis Date  . CHF (congestive heart failure) ([place])   . Colon polyps   . Coronary artery disease   . Diverticulosis   . Dyspnea   . GERD (gastroesophageal reflux disease)   . Hearing loss of both ears   . Hepatic cyst   . Hiatal hernia   . Hyperlipidemia   . Hypertension   . Hypothyroidism   . Low back pain   . Mitral valve disorders(424.0)   . Other left bundle branch block   . Palpitations   . URI (upper respiratory infection)    Past Surgical History:  Procedure Laterality Date  . CARDIAC CATHETERIZATION  04-18-01  . COLONOSCOPY    . HERNIA REPAIR    . PACEMAKER IMPLANT Left 05/15/2016   Procedure: Pacemaker Implant;  Surgeon: [name], MD;  Location: [facility];  Service: Cardiovascular;  Laterality: Left;  . THYROIDECTOMY    . TONSILLECTOMY AND ADENOIDECTOMY    . TOTAL ABDOMINAL HYSTERECTOMY W/ BILATERAL SALPINGOOPHORECTOMY      ROS- all systems are reviewed and negative except as per HPI above  Current Outpatient Prescriptions  Medication Sig Dispense Refill  . aspirin 81 MG tablet Take 81 mg by mouth daily.    Marland Kitchen BIOTIN PO Take 1 tablet by mouth daily.    . bisoprolol (ZEBETA) 10 MG tablet Take 1 tablet (10 mg total) by mouth daily. 90 tablet 1  . Cholecalciferol (VITAMIN D3 PO) Take 2 capsules by mouth daily.    . furosemide (LASIX) 20 MG tablet Take 20 mg by mouth daily.    Marland Kitchen  ipratropium (ATROVENT) 0.03 % nasal spray Place 2 sprays into both nostrils 2 (two) times daily. Do not use for more than 5days. 30 mL 0  . Multiple Vitamins-Minerals (CENTRUM SILVER PO) Take 1 tablet by mouth daily.    . Omega-3 Fatty Acids (FISH OIL PO) Take 1 capsule by mouth daily.    . pantoprazole (PROTONIX) 20 MG tablet Take 1 tablet (20 mg total) by mouth daily. 90 tablet 1  . PROAIR HFA 108 (90 Base) MCG/ACT inhaler INHALE TWO PUFFS BY MOUTH EVERY 6 HOURS AS NEEDED FOR WHEEZING OR SHORTNESS OF BREATH 18 g 0  . SYNTHROID 88 MCG tablet Take 1 tablet (88 mcg total) by mouth daily before breakfast. 90 tablet 1  . vitamin C (ASCORBIC ACID) 500 MG tablet Take 500 mg by mouth daily.     No current facility-administered medications for this visit.     Physical Exam: Vitals:   08/16/16 1100  BP: (!) 142/70  Pulse: 74  SpO2: 97%  Weight: 180 lb (81.6 kg)  Height: 5\' 2"  (1.575 m)    GEN- The patient is well appearing, alert and oriented x 3 today.   Head- normocephalic, atraumatic Eyes-  Sclera clear, conjunctiva pink Ears- hearing intact Oropharynx- clear Lungs- Clear to ausculation bilaterally, normal work of breathing Chest- pacemaker pocket is well healed  Heart- Regular rate and rhythm, no murmurs, rubs or gallops, PMI not laterally displaced GI- soft, NT, ND, + BS Extremities- no clubbing, cyanosis, or edema  Pacemaker interrogation- reviewed in detail today,  See PACEART report  Assessment and Plan:  1. Complete heart block Normal pacemaker function See Pace Art report No changes today  2. HTN Stable No change required today  3. Paroxysmal atrial fibrillation Observed on PPM interrogation Longest episode was 59 minutes <1% overall burdon Asymptomatic Chad2vasc score is 4.  will consider anticoagulation if burden increases  carelink Return to see EP NP in 1 year  [name] MD, [facility] 08/16/2016 11:17 AM

## 2016-08-16 NOTE — Patient Instructions (Signed)
Medication Instructions:  Your physician recommends that you continue on your current medications as directed. Please refer to the Current Medication list given to you today.    Labwork: None ordered   Testing/Procedures: None ordered   Follow-Up: Your physician wants you to follow-up in: 12 months with Dr Rayann Heman Dennis Bast will receive a reminder letter in the mail two months in advance. If you don't receive a letter, please call our office to schedule the follow-up appointment.  Remote monitoring is used to monitor your Pacemaker from home. This monitoring reduces the number of office visits required to check your device to one time per year. It allows [place] to keep an eye on the functioning of your device to ensure it is working properly. You are scheduled for a device check from home on 11/15/16. You may send your transmission at any time that day. If you have a wireless device, the transmission will be sent automatically. After your physician reviews your transmission, you will receive a postcard with your next transmission date.     Any Other Special Instructions Will Be Listed Below (If Applicable).     If you need a refill on your cardiac medications before your next appointment, please call your pharmacy.

## 2016-08-24 DIAGNOSIS — G8929 Other chronic pain: Secondary | ICD-10-CM | POA: Diagnosis not present

## 2016-08-24 DIAGNOSIS — M25512 Pain in left shoulder: Secondary | ICD-10-CM | POA: Diagnosis not present

## 2016-08-31 DIAGNOSIS — G8929 Other chronic pain: Secondary | ICD-10-CM | POA: Diagnosis not present

## 2016-08-31 DIAGNOSIS — M25512 Pain in left shoulder: Secondary | ICD-10-CM | POA: Diagnosis not present

## 2016-09-07 DIAGNOSIS — M25512 Pain in left shoulder: Secondary | ICD-10-CM | POA: Diagnosis not present

## 2016-09-07 DIAGNOSIS — G8929 Other chronic pain: Secondary | ICD-10-CM | POA: Diagnosis not present

## 2016-09-28 DIAGNOSIS — G8929 Other chronic pain: Secondary | ICD-10-CM | POA: Diagnosis not present

## 2016-09-28 DIAGNOSIS — M25512 Pain in left shoulder: Secondary | ICD-10-CM | POA: Diagnosis not present

## 2016-11-06 ENCOUNTER — Other Ambulatory Visit: Payer: Self-pay | Admitting: Internal Medicine

## 2016-11-06 ENCOUNTER — Other Ambulatory Visit: Payer: Self-pay | Admitting: Cardiology

## 2016-11-06 NOTE — Telephone Encounter (Signed)
Rx(s) sent to pharmacy electronically.  

## 2016-11-15 ENCOUNTER — Ambulatory Visit (INDEPENDENT_AMBULATORY_CARE_PROVIDER_SITE_OTHER): Payer: Medicare Other | Admitting: *Deleted

## 2016-11-15 DIAGNOSIS — I442 Atrioventricular block, complete: Secondary | ICD-10-CM

## 2016-11-15 NOTE — Progress Notes (Signed)
Remote pacemaker transmission.   

## 2016-11-16 ENCOUNTER — Encounter: Payer: Self-pay | Admitting: Cardiology

## 2016-11-26 LAB — CUP PACEART REMOTE DEVICE CHECK
Battery Remaining Longevity: 113 mo
Brady Statistic AP VP Percent: 62 %
Brady Statistic AP VS Percent: 1 %
Brady Statistic AS VS Percent: 1 %
Brady Statistic RA Percent Paced: 60 %
Date Time Interrogation Session: 20180913060013
Implantable Lead Implant Date: 20180313
Implantable Lead Implant Date: 20180313
Implantable Lead Location: 753859
Implantable Pulse Generator Implant Date: 20180313
Lead Channel Impedance Value: 590 Ohm
Lead Channel Pacing Threshold Amplitude: 0.75 V
Lead Channel Pacing Threshold Pulse Width: 0.5 ms
Lead Channel Sensing Intrinsic Amplitude: 1.8 mV
Lead Channel Sensing Intrinsic Amplitude: 12 mV
Lead Channel Setting Pacing Amplitude: 1 V
Lead Channel Setting Sensing Sensitivity: 4 mV
MDC IDC LEAD LOCATION: 753860
MDC IDC MSMT BATTERY REMAINING PERCENTAGE: 95.5 %
MDC IDC MSMT BATTERY VOLTAGE: 3.01 V
MDC IDC MSMT LEADCHNL RA IMPEDANCE VALUE: 480 Ohm
MDC IDC MSMT LEADCHNL RA PACING THRESHOLD AMPLITUDE: 1.375 V
MDC IDC MSMT LEADCHNL RA PACING THRESHOLD PULSEWIDTH: 0.5 ms
MDC IDC PG SERIAL: 8005625
MDC IDC SET LEADCHNL RA PACING AMPLITUDE: 2.375
MDC IDC SET LEADCHNL RV PACING PULSEWIDTH: 0.5 ms
MDC IDC STAT BRADY AS VP PERCENT: 35 %
MDC IDC STAT BRADY RV PERCENT PACED: 97 %
Pulse Gen Model: 2272

## 2017-01-13 NOTE — Progress Notes (Signed)
Subjective:    Patient ID: Beverly Kim, female    DOB: [birth year], 83 y.o.   MRN: [id number]  HPI The patient is here for follow up.  SOB:  She has been having SOB with exertion.  At rest she feels ok.  This started years ago, but has gotten worse since March.  She sees cardiology next on 12/13.  She thinks it got worse after her PPM was put in.  She has to rest when doing her household activities.    CAD, chronic diastolic dysfunction, Hypertension: She is taking her medication daily. She is compliant with a low sodium diet.  She denies chest pain, and regular headaches.  She does not monitor her blood pressure at home.    Hypothyroidism:  She is taking her medication daily.  She feels more tired with exertion only.   She denies generalized fatigue.   GERD:  She is taking her medication daily as prescribed.  She denies any GERD symptoms daily and feels her GERD is well controlled.   Prediabetes:  She is compliant with a low sugar/carbohydrate diet.  She is not exercising regularly.  Right 5th toe pain:  It is painful. It started a few months ago.  It is not hurting now. The pain comes and goes.  The pain is localized to the distal lateral aspect of her toe.  She has been putting on benadryl cream on it, which helps.  Sometimes at night she can't stand the bed sheet on it.    Medications and allergies reviewed with patient and updated if appropriate.  Patient Active Problem List   Diagnosis Date Noted  . Non-rheumatic mitral regurgitation 07/12/2016  . Complete heart block ([place]) 05/14/2016  . Bradycardia 05/14/2016  . Wheezing 03/20/2016  . Prediabetes 01/05/2016  . Degenerative disc disease, cervical 08/23/2015  . Degenerative disc disease, lumbar 08/23/2015  . Left rotator cuff tear arthropathy 07/12/2015  . Left shoulder pain 04/06/2015  . Frozen shoulder 01/03/2015  . Cough variant asthma 12/08/2013  . Upper airway cough syndrome 10/27/2013  . Dyspnea 02/03/2013  .  GERD 07/06/2009  . HIATAL HERNIA 07/06/2009  . DIVERTICULOSIS, COLON 07/06/2009  . HEPATIC CYST 07/06/2009  . COLONIC POLYPS, HX OF 07/06/2009  . MIXED HEARING LOSS BILATERAL 12/13/2008  . Essential hypertension 06/19/2008  . MITRAL VALVE PROLAPSE 06/19/2008  . LBBB (left bundle branch block) 06/19/2008  . Hyperlipidemia 08/12/2007  . Hypothyroidism 12/02/2006  . Coronary atherosclerosis 12/02/2006  . Chronic diastolic heart failure ([place]) 12/02/2006    Current Outpatient Medications on File Prior to Visit  Medication Sig Dispense Refill  . aspirin 81 MG tablet Take 81 mg by mouth daily.    Marland Kitchen BIOTIN PO Take 1 tablet by mouth daily.    . bisoprolol (ZEBETA) 10 MG tablet Take 1 tablet (10 mg total) by mouth daily. 90 tablet 1  . Cholecalciferol (VITAMIN D3 PO) Take 2 capsules by mouth daily.    . furosemide (LASIX) 20 MG tablet TAKE ONE TABLET BY MOUTH ONCE DAILY 90 tablet 2  . ipratropium (ATROVENT) 0.03 % nasal spray Place 2 sprays into both nostrils 2 (two) times daily. Do not use for more than 5days. 30 mL 0  . Multiple Vitamins-Minerals (CENTRUM SILVER PO) Take 1 tablet by mouth daily.    . Omega-3 Fatty Acids (FISH OIL PO) Take 1 capsule by mouth daily.    . pantoprazole (PROTONIX) 20 MG tablet TAKE ONE TABLET BY MOUTH ONCE DAILY 90 tablet 0  .  PROAIR HFA 108 (90 Base) MCG/ACT inhaler INHALE TWO PUFFS BY MOUTH EVERY 6 HOURS AS NEEDED FOR WHEEZING OR SHORTNESS OF BREATH 18 g 0  . SYNTHROID 88 MCG tablet Take 1 tablet (88 mcg total) by mouth daily before breakfast. 90 tablet 1  . vitamin C (ASCORBIC ACID) 500 MG tablet Take 500 mg by mouth daily.     No current facility-administered medications on file prior to visit.     Past Medical History:  Diagnosis Date  . Atrial fibrillation ([place]) 08/16/2016   observed on PPM interrogation, asymptomatic, chad2vasc score is 4.  will consider anticoagulation  . CHF (congestive heart failure) ([place])   . Colon polyps   . Coronary artery  disease   . Diverticulosis   . Dyspnea   . GERD (gastroesophageal reflux disease)   . Hearing loss of both ears   . Hepatic cyst   . Hiatal hernia   . Hyperlipidemia   . Hypertension   . Hypothyroidism   . Low back pain   . Mitral valve disorders(424.0)   . Other left bundle branch block   . Palpitations   . URI (upper respiratory infection)     Past Surgical History:  Procedure Laterality Date  . CARDIAC CATHETERIZATION  04-18-01  . COLONOSCOPY    . HERNIA REPAIR    . THYROIDECTOMY    . TONSILLECTOMY AND ADENOIDECTOMY    . TOTAL ABDOMINAL HYSTERECTOMY W/ BILATERAL SALPINGOOPHORECTOMY      Social History   Socioeconomic History  . Marital status: Widowed    Spouse name: Not on file  . Number of children: 4  . Years of education: Not on file  . Highest education level: Not on file  Social Needs  . Financial resource strain: Not on file  . Food insecurity - worry: Not on file  . Food insecurity - inability: Not on file  . Transportation needs - medical: Not on file  . Transportation needs - non-medical: Not on file  Occupational History  . Occupation: retired  Tobacco Use  . Smoking status: Never Smoker  . Smokeless tobacco: Never Used  Substance and Sexual Activity  . Alcohol use: No  . Drug use: No  . Sexual activity: Not on file  Other Topics Concern  . Not on file  Social History Narrative  . Not on file    Family History  Problem Relation Age of Onset  . Coronary artery disease Unknown        family hx of 1st degree relative <50  . Diabetes Unknown        family hx of  . Other Unknown        cardiovascular disorder family hx of  . Other Unknown        neurological disorder family hx of  . Other Unknown        respiratory disease family hx of  . Heart attack Daughter   . Heart Problems Unknown        all children  . Sudden death Son   . Heart disease Brother   . Heart disease Sister   . Colon cancer Neg Hx   . Colon polyps Neg Hx      Review of Systems  Constitutional: Negative for chills and fever.  Respiratory: Positive for shortness of breath (with exertion). Negative for cough and wheezing.   Cardiovascular: Positive for palpitations (occ) and leg swelling (mild edema). Negative for chest pain.  Musculoskeletal:  Hips achy and tired  Neurological: Positive for headaches (4-5 migraines in the past month). Negative for light-headedness.       Objective:   Vitals:   01/15/17 1128  BP: (!) 150/72  Pulse: 72  Resp: 16  Temp: 97.9 F (36.6 C)  SpO2: 96%   Wt Readings from Last 3 Encounters:  01/15/17 183 lb (83 kg)  08/16/16 180 lb (81.6 kg)  07/12/16 179 lb (81.2 kg)   Body mass index is 33.47 kg/m.   Physical Exam    Constitutional: Appears well-developed and well-nourished. No distress.  HENT:  Head: Normocephalic and atraumatic.  Neck: Neck supple. No tracheal deviation present. No thyromegaly present.  No cervical lymphadenopathy Cardiovascular: Normal rate, regular rhythm and normal heart sounds.   No murmur heard. No carotid bruit .  Mild B/L LE edema Pulmonary/Chest: Effort normal and breath sounds normal. No respiratory distress. No has no wheezes. No rales.  Skin: Skin is warm and dry. Not diaphoretic.  Psychiatric: Normal mood and affect. Behavior is normal.      Assessment & Plan:    See Problem List for Assessment and Plan of chronic medical problems.

## 2017-01-13 NOTE — Patient Instructions (Addendum)
  Test(s) ordered today. Your results will be released to [place] (or called to you) after review, usually within 72hours after test completion. If any changes need to be made, you will be notified at that same time.  Flu immunization administered today.   Medications reviewed and updated. No changes recommended at this time.   Please followup in 6 months  Continue doing brain stimulating activities (puzzles, reading, adult coloring books, staying active) to keep memory sharp.   Continue to eat heart healthy diet (full of fruits, vegetables, whole grains, lean protein, water--limit salt, fat, and sugar intake) and increase physical activity as tolerated.  Take calcium and vitamin D-3 or caltrate  [name] , Thank you for taking time to come for your Medicare Wellness Visit. I appreciate your ongoing commitment to your health goals. Please review the following plan we discussed and let me know if I can assist you in the future.   These are the goals we discussed: Stay as healthy and as independent as possible. Enjoy life, family and worship God.  Goals    None      This is a list of the screening recommended for you and due dates:  Health Maintenance  Topic Date Due  . DEXA scan (bone density measurement)  09/28/1991  . Pneumonia vaccines (2 of 2 - PCV13) 08/11/2008  . Tetanus Vaccine  03/05/2013  . Flu Shot  03/16/2017*  *Topic was postponed. The date shown is not the original due date.

## 2017-01-14 NOTE — Progress Notes (Signed)
Subjective:   Beverly Kim is a 83 y.o. female who presents for Medicare Annual (Subsequent) preventive examination.  Review of Systems:  No ROS.  Medicare Wellness Visit. Additional risk factors are reflected in the social history.  Cardiac Risk Factors include: advanced age (>32men, >40 women);dyslipidemia;hypertension Sleep patterns: does not get up to void, gets up 1-2 times nightly to void and sleeps 7 hours nightly.   Home Safety/Smoke Alarms: Feels safe in home. Smoke alarms in place.  Living environment; residence and Firearm Safety: 1-story house/ trailer, equipment: Walkers, Type: Conservation officer, nature, no firearms. Lives alone, no needs for DME, good support system Seat Belt Safety/Bike Helmet: Wears seat belt.     Objective:     Vitals: BP (!) 150/72   Pulse 72   Temp 97.9 F (36.6 C) (Oral)   Resp 16   Wt 183 lb (83 kg)   SpO2 96%   BMI 33.47 kg/m   Body mass index is 33.47 kg/m.   Tobacco Social History   Tobacco Use  Smoking Status Never Smoker  Smokeless Tobacco Never Used     Counseling given: Not Answered   Past Medical History:  Diagnosis Date  . Atrial fibrillation ([place]) 08/16/2016   observed on PPM interrogation, asymptomatic, chad2vasc score is 4.  will consider anticoagulation  . CHF (congestive heart failure) ([place])   . Colon polyps   . Coronary artery disease   . Diverticulosis   . Dyspnea   . GERD (gastroesophageal reflux disease)   . Hearing loss of both ears   . Hepatic cyst   . Hiatal hernia   . Hyperlipidemia   . Hypertension   . Hypothyroidism   . Low back pain   . Mitral valve disorders(424.0)   . Other left bundle branch block   . Palpitations   . URI (upper respiratory infection)    Past Surgical History:  Procedure Laterality Date  . CARDIAC CATHETERIZATION  04-18-01  . COLONOSCOPY    . HERNIA REPAIR    . THYROIDECTOMY    . TONSILLECTOMY AND ADENOIDECTOMY    . TOTAL ABDOMINAL HYSTERECTOMY W/ BILATERAL  SALPINGOOPHORECTOMY     Family History  Problem Relation Age of Onset  . Coronary artery disease Unknown        family hx of 1st degree relative <50  . Diabetes Unknown        family hx of  . Other Unknown        cardiovascular disorder family hx of  . Other Unknown        neurological disorder family hx of  . Other Unknown        respiratory disease family hx of  . Heart attack Daughter   . Heart Problems Unknown        all children  . Sudden death Son   . Heart disease Brother   . Heart disease Sister   . Colon cancer Neg Hx   . Colon polyps Neg Hx    Social History   Substance and Sexual Activity  Sexual Activity Not on file    Outpatient Encounter Medications as of 01/15/2017  Medication Sig  . aspirin 81 MG tablet Take 81 mg by mouth daily.  Marland Kitchen BIOTIN PO Take 1 tablet by mouth daily.  . bisoprolol (ZEBETA) 10 MG tablet Take 1 tablet (10 mg total) by mouth daily.  . Cholecalciferol (VITAMIN D3 PO) Take 2 capsules by mouth daily.  . furosemide (LASIX) 20 MG tablet TAKE ONE  TABLET BY MOUTH ONCE DAILY  . ipratropium (ATROVENT) 0.03 % nasal spray Place 2 sprays into both nostrils 2 (two) times daily. Do not use for more than 5days.  . Multiple Vitamins-Minerals (CENTRUM SILVER PO) Take 1 tablet by mouth daily.  . Omega-3 Fatty Acids (FISH OIL PO) Take 1 capsule by mouth daily.  . pantoprazole (PROTONIX) 20 MG tablet TAKE ONE TABLET BY MOUTH ONCE DAILY  . PROAIR HFA 108 (90 Base) MCG/ACT inhaler INHALE TWO PUFFS BY MOUTH EVERY 6 HOURS AS NEEDED FOR WHEEZING OR SHORTNESS OF BREATH  . SYNTHROID 88 MCG tablet Take 1 tablet (88 mcg total) by mouth daily before breakfast.  . vitamin C (ASCORBIC ACID) 500 MG tablet Take 500 mg by mouth daily.  . [DISCONTINUED] furosemide (LASIX) 20 MG tablet Take 20 mg by mouth daily.   No facility-administered encounter medications on file as of 01/15/2017.     Activities of Daily Living In your present state of health, do you have any  difficulty performing the following activities: 01/15/2017 05/14/2016  Hearing? Y N  Vision? N N  Difficulty concentrating or making decisions? N N  Walking or climbing stairs? N N  Dressing or bathing? N N  Doing errands, shopping? N N  Preparing Food and eating ? N -  Using the Toilet? N -  In the past six months, have you accidently leaked urine? N -  Do you have problems with loss of bowel control? N -  Managing your Medications? N -  Managing your Finances? N -  Housekeeping or managing your Housekeeping? N -  Some recent data might be hidden    Patient Care Team: [name], MD as PCP - General (Internal Medicine) Minus Breeding, MD as Consulting Physician (Cardiology)    Assessment:    Physical assessment deferred to PCP.  Exercise Activities and Dietary recommendations Current Exercise Habits: The patient does not participate in regular exercise at present, Exercise limited by: cardiac condition(s)  Diet (meal preparation, eat out, water intake, caffeinated beverages, dairy products, fruits and vegetables): in general, a "healthy" diet  , well balanced  Reviewed heart healthy diet, encouraged patient to increase daily water intake.  Goals    None     Fall Risk Fall Risk  01/15/2017 03/16/2016 01/03/2015 01/03/2015 12/01/2013  Falls in the past year? No No Yes No No  Number falls in past yr: - - 1 - -  Injury with Fall? - - No - -   Depression Screen PHQ 2/9 Scores 01/15/2017 03/16/2016 01/03/2015 01/03/2015  PHQ - 2 Score 0 0 0 0     Cognitive Function MMSE - Mini Mental State Exam 01/15/2017  Orientation to time 5  Orientation to Place 5  Registration 3  Attention/ Calculation 4  Recall 1  Language- name 2 objects 2  Language- repeat 1  Language- follow 3 step command 3  Language- read & follow direction 1  Write a sentence 1  Copy design 1  Total score 27        Immunization History  Administered Date(s) Administered  . Influenza Split  12/25/2010, 12/25/2011  . Influenza Whole 03/05/2005, 12/13/2008  . Influenza, High Dose Seasonal PF 12/16/2012  . Pneumococcal Polysaccharide-23 03/05/2002, 08/12/2007  . Td 03/06/2003  . Zoster 08/12/2007   Screening Tests Health Maintenance  Topic Date Due  . PNA vac Low Risk Adult (2 of 2 - PCV13) 08/11/2008  . TETANUS/TDAP  03/05/2013  . INFLUENZA VACCINE  03/16/2017 (Originally 10/03/2016)  .  DEXA SCAN  01/17/2018 (Originally 09/28/1991)      Plan:    I have personally reviewed and noted the following in the patient's chart:   . Medical and social history . Use of alcohol, tobacco or illicit drugs  . Current medications and supplements . Functional ability and status . Nutritional status . Physical activity . Advanced directives . List of other physicians . Vitals . Screenings to include cognitive, depression, and falls . Referrals and appointments  In addition, I have reviewed and discussed with patient certain preventive protocols, quality metrics, and best practice recommendations. A written personalized care plan for preventive services as well as general preventive health recommendations were provided to patient.     Michiel Cowboy, RN  01/15/2017    Medical screening examination/treatment/procedure(s) were performed by non-physician practitioner and as supervising physician I was immediately available for consultation/collaboration. I agree with above. [name], MD

## 2017-01-15 ENCOUNTER — Encounter: Payer: Self-pay | Admitting: Internal Medicine

## 2017-01-15 ENCOUNTER — Ambulatory Visit (INDEPENDENT_AMBULATORY_CARE_PROVIDER_SITE_OTHER): Payer: Medicare Other | Admitting: Internal Medicine

## 2017-01-15 ENCOUNTER — Other Ambulatory Visit (INDEPENDENT_AMBULATORY_CARE_PROVIDER_SITE_OTHER): Payer: Medicare Other

## 2017-01-15 VITALS — BP 150/72 | HR 72 | Temp 97.9°F | Resp 16 | Wt 183.0 lb

## 2017-01-15 DIAGNOSIS — R7303 Prediabetes: Secondary | ICD-10-CM

## 2017-01-15 DIAGNOSIS — Z23 Encounter for immunization: Secondary | ICD-10-CM

## 2017-01-15 DIAGNOSIS — K219 Gastro-esophageal reflux disease without esophagitis: Secondary | ICD-10-CM

## 2017-01-15 DIAGNOSIS — I5032 Chronic diastolic (congestive) heart failure: Secondary | ICD-10-CM | POA: Diagnosis not present

## 2017-01-15 DIAGNOSIS — R0600 Dyspnea, unspecified: Secondary | ICD-10-CM

## 2017-01-15 DIAGNOSIS — Z0000 Encounter for general adult medical examination without abnormal findings: Secondary | ICD-10-CM

## 2017-01-15 DIAGNOSIS — I10 Essential (primary) hypertension: Secondary | ICD-10-CM | POA: Diagnosis not present

## 2017-01-15 DIAGNOSIS — E038 Other specified hypothyroidism: Secondary | ICD-10-CM | POA: Diagnosis not present

## 2017-01-15 LAB — CBC WITH DIFFERENTIAL/PLATELET
BASOS PCT: 0.7 % (ref 0.0–3.0)
Basophils Absolute: 0.1 10*3/uL (ref 0.0–0.1)
EOS PCT: 2.6 % (ref 0.0–5.0)
Eosinophils Absolute: 0.2 10*3/uL (ref 0.0–0.7)
HCT: 43.1 % (ref 36.0–46.0)
Hemoglobin: 14.3 g/dL (ref 12.0–15.0)
Lymphocytes Relative: 36.7 % (ref 12.0–46.0)
Lymphs Abs: 3.3 10*3/uL (ref 0.7–4.0)
MCHC: 33.1 g/dL (ref 30.0–36.0)
MCV: 97.4 fl (ref 78.0–100.0)
MONO ABS: 0.6 10*3/uL (ref 0.1–1.0)
Monocytes Relative: 7.1 % (ref 3.0–12.0)
Neutro Abs: 4.8 10*3/uL (ref 1.4–7.7)
Neutrophils Relative %: 52.9 % (ref 43.0–77.0)
Platelets: 222 10*3/uL (ref 150.0–400.0)
RBC: 4.43 Mil/uL (ref 3.87–5.11)
RDW: 13.9 % (ref 11.5–15.5)
WBC: 9.1 10*3/uL (ref 4.0–10.5)

## 2017-01-15 LAB — COMPREHENSIVE METABOLIC PANEL
ALBUMIN: 4.3 g/dL (ref 3.5–5.2)
ALT: 16 U/L (ref 0–35)
AST: 18 U/L (ref 0–37)
Alkaline Phosphatase: 67 U/L (ref 39–117)
BUN: 15 mg/dL (ref 6–23)
CHLORIDE: 104 meq/L (ref 96–112)
CO2: 28 meq/L (ref 19–32)
Calcium: 10.1 mg/dL (ref 8.4–10.5)
Creatinine, Ser: 0.9 mg/dL (ref 0.40–1.20)
GFR: 62.48 mL/min (ref >60.00)
GLUCOSE: 119 mg/dL — AB (ref 70–99)
POTASSIUM: 5.4 meq/L — AB (ref 3.5–5.1)
Sodium: 139 mEq/L (ref 135–145)
Total Bilirubin: 0.9 mg/dL (ref 0.2–1.2)
Total Protein: 7.4 g/dL (ref 6.0–8.3)

## 2017-01-15 LAB — HEMOGLOBIN A1C: HEMOGLOBIN A1C: 6.3 % (ref 4.6–6.5)

## 2017-01-15 LAB — TSH: TSH: 0.75 u[IU]/mL (ref 0.35–4.50)

## 2017-01-15 LAB — BRAIN NATRIURETIC PEPTIDE: PRO B NATRI PEPTIDE: 515 pg/mL — AB (ref 0.0–100.0)

## 2017-01-15 NOTE — Assessment & Plan Note (Addendum)
Having increased SOB with exertion Chronic edema - no change, lungs clear - does not appear to be fluid overloaded but will check BNP Sees cardiology next month - advised her to call cardio sooner if SOB worsens

## 2017-01-15 NOTE — Assessment & Plan Note (Signed)
Check a1c Low sugar / carb diet Stressed regular exercise   

## 2017-01-15 NOTE — Assessment & Plan Note (Signed)
Check tsh  Titrate med dose if needed  

## 2017-01-15 NOTE — Assessment & Plan Note (Addendum)
Continues to have DOE which is chronic, but she feels it has gotten worse Sees cardiology next month She has seen pulmonary in the past - can consider referring back to pulm Does ADLs and household activities, no regular exercise Check BNP given chronic diastolic /systolic HF

## 2017-01-15 NOTE — Assessment & Plan Note (Signed)
GERD controlled Continue daily medication  

## 2017-01-15 NOTE — Assessment & Plan Note (Signed)
BP Readings from Last 3 Encounters:  01/15/17 (!) 150/72  08/16/16 (!) 142/70  07/12/16 136/62   Slightly elevated here today, but appears controlled No change in medications Sees cardiology next month

## 2017-02-01 DIAGNOSIS — H43813 Vitreous degeneration, bilateral: Secondary | ICD-10-CM | POA: Diagnosis not present

## 2017-02-01 DIAGNOSIS — H26492 Other secondary cataract, left eye: Secondary | ICD-10-CM | POA: Diagnosis not present

## 2017-02-01 DIAGNOSIS — H04123 Dry eye syndrome of bilateral lacrimal glands: Secondary | ICD-10-CM | POA: Diagnosis not present

## 2017-02-01 DIAGNOSIS — H52202 Unspecified astigmatism, left eye: Secondary | ICD-10-CM | POA: Diagnosis not present

## 2017-02-07 DIAGNOSIS — H26492 Other secondary cataract, left eye: Secondary | ICD-10-CM | POA: Diagnosis not present

## 2017-02-14 ENCOUNTER — Ambulatory Visit (INDEPENDENT_AMBULATORY_CARE_PROVIDER_SITE_OTHER): Payer: Medicare Other | Admitting: *Deleted

## 2017-02-14 DIAGNOSIS — I442 Atrioventricular block, complete: Secondary | ICD-10-CM

## 2017-02-14 NOTE — Progress Notes (Signed)
Remote pacemaker transmission.   

## 2017-02-15 ENCOUNTER — Other Ambulatory Visit: Payer: Self-pay | Admitting: Internal Medicine

## 2017-02-19 ENCOUNTER — Encounter: Payer: Self-pay | Admitting: Cardiology

## 2017-02-21 ENCOUNTER — Telehealth: Payer: Self-pay | Admitting: Cardiology

## 2017-02-21 NOTE — Telephone Encounter (Signed)
Pt of Dr. Percival Spanish C/o SOB, fatigue  Returned call. Pt notes progression of symptoms, states she's been feeling more short of breath over the last couple of weeks. Family members requested she call cardiology to be seen. She is unsure about weight gain, denies leg swelling, reports she has had some increase in fatigue and shortness of breath over the last week or so. No HR or BP findings reported. She's aware to call back if her symptoms worsen, or go to ED if symptoms become urgent/worsen rapidly, but o/w OK to follow up tomorrow as scheduled. Pt voiced thanks for f/u call.  Will see Dr. Percival Spanish tomorrow 3pm.

## 2017-02-21 NOTE — Telephone Encounter (Signed)
Pt says she having some problems breathing,I made an appointment for tomorrow.Please call to evaluate.

## 2017-02-22 ENCOUNTER — Encounter: Payer: Self-pay | Admitting: Cardiology

## 2017-02-22 ENCOUNTER — Ambulatory Visit (INDEPENDENT_AMBULATORY_CARE_PROVIDER_SITE_OTHER): Payer: Medicare Other | Admitting: Cardiology

## 2017-02-22 VITALS — BP 130/66 | HR 68 | Ht 62.0 in | Wt 184.0 lb

## 2017-02-22 DIAGNOSIS — R0602 Shortness of breath: Secondary | ICD-10-CM

## 2017-02-22 DIAGNOSIS — R188 Other ascites: Secondary | ICD-10-CM

## 2017-02-22 DIAGNOSIS — I5033 Acute on chronic diastolic (congestive) heart failure: Secondary | ICD-10-CM | POA: Diagnosis not present

## 2017-02-22 MED ORDER — FUROSEMIDE 40 MG PO TABS: 40.0000 mg | ORAL_TABLET | Freq: Every day | ORAL | 11 refills | Status: DC

## 2017-02-22 NOTE — Patient Instructions (Addendum)
Medication Instructions:  INCREASE- Lasix 40 mg daily  If you need a refill on your cardiac medications before your next appointment, please call your pharmacy.  Labwork: BNP in 2 weeks HERE IN OUR OFFICE AT [facility]  Take the provided lab slips for you to take with you to the lab for you blood draw.   You will NOT need to fast   You may go to any [facility] lab that is convenient for you however, we do have a lab in our office that is able to assist you. You do NOT need an appointment for our lab. Once in our office lobby there is a podium to the right of the check-in desk where you are to sign-in and ring a doorbell to alert [place] you are here. Lab is open Monday-Friday from 8:00am to 4:00pm; and is closed for lunch from 12:45p-1:45pm   Testing/Procedures: Your physician has requested that you have an abdominal aorta duplex. During this test, an ultrasound is used to evaluate the aorta. Allow 30 minutes for this exam. Do not eat after midnight the day before and avoid carbonated beverages   Special Instructions:  Happy Holidays!!  Follow-Up: Your physician wants you to follow-up in: 6 Week with Lurena Joiner or [place].  Thank you for choosing [facility]!!

## 2017-02-22 NOTE — Progress Notes (Signed)
HPI The patient presents for evaluation of CHB status post recent admission.  She has a past history of nonobstructive coronary disease with a 70% LAD stenosis in 2003. In 2013 she had an echo which was low normal LV function but mild mitral regurgitation. In November of 2013 she had a stress perfusion study with no evidence of ischemia or infarct. She has had some suggestion of diastolic dysfunction on echo. She has had pulmonary function tests which demonstrated normal lung volumes and only a mildly reduced diffusion capacity.  EF on echo in 2015 was mildly reduced at 45 - 50%.  She presented in mid March with fatigue and dyspnea and was found to have heart block.   Because of the previous reduced EF she was given CRT.   Earlier this year her ejection fraction was 60-65% with mild mitral regurgitation.  In November she saw her primary provider and was complaining of shortness of breath and her BNP was greater than 500 she was given a couple of days of diuretic.  She returns today with several somatic complaints including shortness of breath with activity, abdominal fullness,  Bendopathy.  Her weights have been stable.  She watches her salt but she does drink lots of fluid.  She denies any chest pressure, neck or arm discomfort.  She is not describing palpitations, presyncope or syncope.  She has some mild lower extremity swelling.     Allergies  Allergen Reactions  . Adhesive [Tape] Itching, Dermatitis, Rash and Other (See Comments)    Blisters and "skin bubbles"  . Ace Inhibitors Cough  . Atorvastatin Other (See Comments)    REACTION: Reaction not known  . Clindamycin Other (See Comments)    Unknown  . Codeine Other (See Comments)    Unknown  . Latex Other (See Comments)    Unknown  . Penicillins Other (See Comments)    Unknown  . Simvastatin Other (See Comments)    REACTION: fatigue    Current Outpatient Medications  Medication Sig Dispense Refill  . aspirin 81 MG tablet Take 81 mg  by mouth daily.    Marland Kitchen BIOTIN PO Take 1 tablet by mouth daily.    . bisoprolol (ZEBETA) 10 MG tablet Take 1 tablet (10 mg total) by mouth daily. 90 tablet 1  . Cholecalciferol (VITAMIN D3 PO) Take 2 capsules by mouth daily.    . furosemide (LASIX) 40 MG tablet Take 1 tablet (40 mg total) by mouth daily. 30 tablet 11  . ipratropium (ATROVENT) 0.03 % nasal spray Place 2 sprays into both nostrils 2 (two) times daily. Do not use for more than 5days. 30 mL 0  . Multiple Vitamins-Minerals (CENTRUM SILVER PO) Take 1 tablet by mouth daily.    . Omega-3 Fatty Acids (FISH OIL PO) Take 1 capsule by mouth daily.    . pantoprazole (PROTONIX) 20 MG tablet TAKE 1 TABLET BY MOUTH ONCE DAILY 90 tablet 3  . PROAIR HFA 108 (90 Base) MCG/ACT inhaler INHALE TWO PUFFS BY MOUTH EVERY 6 HOURS AS NEEDED FOR WHEEZING OR SHORTNESS OF BREATH 18 g 0  . SYNTHROID 88 MCG tablet Take 1 tablet (88 mcg total) by mouth daily before breakfast. 90 tablet 1  . vitamin C (ASCORBIC ACID) 500 MG tablet Take 500 mg by mouth daily.     No current facility-administered medications for this visit.     Past Medical History:  Diagnosis Date  . Atrial fibrillation ([place]) 08/16/2016   observed on PPM interrogation, asymptomatic,  chad2vasc score is 4.  will consider anticoagulation  . CHF (congestive heart failure) ([place])   . Colon polyps   . Coronary artery disease   . Diverticulosis   . Dyspnea   . GERD (gastroesophageal reflux disease)   . Hearing loss of both ears   . Hepatic cyst   . Hiatal hernia   . Hyperlipidemia   . Hypertension   . Hypothyroidism   . Low back pain   . Mitral valve disorders(424.0)   . Other left bundle branch block   . Palpitations   . URI (upper respiratory infection)     Past Surgical History:  Procedure Laterality Date  . CARDIAC CATHETERIZATION  04-18-01  . COLONOSCOPY    . HERNIA REPAIR    . PACEMAKER IMPLANT Left 05/15/2016   SJM Assirity MRI dual chamber PPM implanted by Dr Rayann Heman for CHB  .  THYROIDECTOMY    . TONSILLECTOMY AND ADENOIDECTOMY    . TOTAL ABDOMINAL HYSTERECTOMY W/ BILATERAL SALPINGOOPHORECTOMY      ROS:   As stated in the HPI and negative for all other systems.  PHYSICAL EXAM BP 130/66   Pulse 68   Ht 5\' 2"  (1.575 m)   Wt 184 lb (83.5 kg)   BMI 33.65 kg/m   GENERAL:  Well appearing NECK:  No jugular venous distention, waveform within normal limits, carotid upstroke brisk and symmetric, no bruits, no thyromegaly LUNGS:  Clear to auscultation bilaterally CHEST:  Unremarkable HEART:  PMI not displaced or sustained,S1 and S2 within normal limits, no S3, no S4, no clicks, no rubs, no murmurs ABD:  Flat, positive bowel sounds normal in frequency in pitch, no bruits, no rebound, no guarding, no midline pulsatile mass, no hepatomegaly, no splenomegaly EXT:  2 plus pulses throughout, no edema, no cyanosis no clubbing   EKG: Sinus rhythm with ventricular pacing with premature atrial contractions  ASSESSMENT AND PLAN   ACUTE ON CHRONIC DIASTOLIC HF:    Her symptoms are out of proportion to her oxygen saturation.  She walked around the office today and though she was very dyspneic her saturations were  97%.  However, she has had an elevated BNP.  I am going to increase her Lasix to 40 mg daily and check a basic metabolic profile in 2 weeks.  She is convinced she has abdominal fluid and so I will check an ultrasound for ascites.  She was cautioned and talked to her about decreasing her fluid intake which I think is been excessive.    HTN:     The blood pressure is at target. No change in medications is indicated. We will continue with therapeutic lifestyle changes (TLC).  MR:  Mild as above.  I will follow this clinically.   CHB S/P CRT:  This is to be followed by Dr. Rayann Heman in June.    ATRIAL FIB: We called over to the office and she does not have any increased atrial fibrillation.  She had some very infrequent readings and per Dr. Rayann Heman if he was going to wait and  see if there was an increase in this prior to considering anticoagulation.  There has been no further fibrillation.

## 2017-03-01 ENCOUNTER — Telehealth: Payer: Self-pay | Admitting: *Deleted

## 2017-03-01 DIAGNOSIS — R188 Other ascites: Secondary | ICD-10-CM

## 2017-03-01 NOTE — Telephone Encounter (Signed)
-----   Message from Vennie Homans sent at 03/01/2017  3:58 PM EST ----- Regarding: FW: Ultrasound of the abdomen   ----- Message ----- From: Freida Busman, RDMS Sent: 03/01/2017  10:26 AM To: Minus Breeding, MD Subject: Ultrasound of the abdomen                      Office notes on 02/22/17 states that you want an U/s of the abdomen to check for ascites. This is not a vascular study and needs to be set up with General U/s. There is no indication for a vascular study and is not billable.   Thank you  Wilkie Aye RVT

## 2017-03-01 NOTE — Telephone Encounter (Signed)
U/S abdomen was ordered will cancel AAA

## 2017-03-05 ENCOUNTER — Other Ambulatory Visit: Payer: Self-pay

## 2017-03-05 ENCOUNTER — Emergency Department (HOSPITAL_BASED_OUTPATIENT_CLINIC_OR_DEPARTMENT_OTHER)
Admission: EM | Admit: 2017-03-05 | Discharge: 2017-03-05 | Disposition: A | Discharge status: Home / Self Care | Payer: Medicare Other | Attending: Emergency Medicine | Admitting: Emergency Medicine

## 2017-03-05 ENCOUNTER — Emergency Department (HOSPITAL_BASED_OUTPATIENT_CLINIC_OR_DEPARTMENT_OTHER): Payer: Medicare Other

## 2017-03-05 ENCOUNTER — Encounter (HOSPITAL_BASED_OUTPATIENT_CLINIC_OR_DEPARTMENT_OTHER): Payer: Self-pay | Admitting: Emergency Medicine

## 2017-03-05 DIAGNOSIS — I4891 Unspecified atrial fibrillation: Secondary | ICD-10-CM | POA: Diagnosis not present

## 2017-03-05 DIAGNOSIS — Z7982 Long term (current) use of aspirin: Secondary | ICD-10-CM | POA: Diagnosis not present

## 2017-03-05 DIAGNOSIS — E039 Hypothyroidism, unspecified: Secondary | ICD-10-CM | POA: Diagnosis not present

## 2017-03-05 DIAGNOSIS — J209 Acute bronchitis, unspecified: Secondary | ICD-10-CM

## 2017-03-05 DIAGNOSIS — I2510 Atherosclerotic heart disease of native coronary artery without angina pectoris: Secondary | ICD-10-CM | POA: Insufficient documentation

## 2017-03-05 DIAGNOSIS — I442 Atrioventricular block, complete: Secondary | ICD-10-CM | POA: Diagnosis not present

## 2017-03-05 DIAGNOSIS — I5032 Chronic diastolic (congestive) heart failure: Secondary | ICD-10-CM | POA: Insufficient documentation

## 2017-03-05 DIAGNOSIS — E785 Hyperlipidemia, unspecified: Secondary | ICD-10-CM | POA: Insufficient documentation

## 2017-03-05 DIAGNOSIS — Z91040 Latex allergy status: Secondary | ICD-10-CM | POA: Insufficient documentation

## 2017-03-05 DIAGNOSIS — I110 Hypertensive heart disease with heart failure: Secondary | ICD-10-CM | POA: Diagnosis not present

## 2017-03-05 DIAGNOSIS — R0981 Nasal congestion: Secondary | ICD-10-CM | POA: Diagnosis not present

## 2017-03-05 DIAGNOSIS — R0602 Shortness of breath: Secondary | ICD-10-CM | POA: Diagnosis not present

## 2017-03-05 DIAGNOSIS — R05 Cough: Secondary | ICD-10-CM | POA: Diagnosis not present

## 2017-03-05 DIAGNOSIS — Z79899 Other long term (current) drug therapy: Secondary | ICD-10-CM | POA: Insufficient documentation

## 2017-03-05 DIAGNOSIS — R0789 Other chest pain: Secondary | ICD-10-CM | POA: Diagnosis not present

## 2017-03-05 MED ORDER — IPRATROPIUM-ALBUTEROL 0.5-2.5 (3) MG/3ML IN SOLN
3.0000 mL | Freq: Once | RESPIRATORY_TRACT | Status: AC
  Administered 2017-03-05: 3 mL via RESPIRATORY_TRACT
  Filled 2017-03-05: qty 3

## 2017-03-05 MED ORDER — AZITHROMYCIN 250 MG PO TABS: 250.0000 mg | ORAL_TABLET | Freq: Every day | ORAL | 0 refills | Status: DC

## 2017-03-05 MED ORDER — ALBUTEROL SULFATE HFA 108 (90 BASE) MCG/ACT IN AERS: 1.0000 | INHALATION_SPRAY | Freq: Four times a day (QID) | RESPIRATORY_TRACT | 0 refills | Status: DC | PRN

## 2017-03-05 NOTE — ED Provider Notes (Signed)
[facility] EMERGENCY DEPARTMENT Provider Note   CSN: [id number] Arrival date & time: 03/05/17  1320     History   Chief Complaint Chief Complaint  Patient presents with  . Cough    HPI Beverly Kim is a 84 y.o. female.  HPI  84 year old female with a history of congestive heart failure, atrial fibrillation, pacemaker for heart block presents with cough for 1 week.  She has been having chest tightness for the whole week as well.  She feels like she has a fever because her temperature is 98 and typically her temperature is 97.  She has been coughing up a mixture of green and brown sputum and occasionally streaks of blood.  No new leg swelling.  Feels somewhat short of breath.  She denies any chronic lung disease such as COPD or chronic bronchitis.  She has been feeling head and chest congestion as well.  Mucinex has been helpful but when it wears off she feels sick again.  Past Medical History:  Diagnosis Date  . Atrial fibrillation ([place]) 08/16/2016   observed on PPM interrogation, asymptomatic, chad2vasc score is 4.  will consider anticoagulation  . CHF (congestive heart failure) ([place])   . Colon polyps   . Coronary artery disease   . Diverticulosis   . Dyspnea   . GERD (gastroesophageal reflux disease)   . Hearing loss of both ears   . Hepatic cyst   . Hiatal hernia   . Hyperlipidemia   . Hypertension   . Hypothyroidism   . Low back pain   . Mitral valve disorders(424.0)   . Other left bundle branch block   . Palpitations   . URI (upper respiratory infection)     Patient Active Problem List   Diagnosis Date Noted  . Non-rheumatic mitral regurgitation 07/12/2016  . Complete heart block ([place]) 05/14/2016  . Bradycardia 05/14/2016  . Wheezing 03/20/2016  . Prediabetes 01/05/2016  . Degenerative disc disease, cervical 08/23/2015  . Degenerative disc disease, lumbar 08/23/2015  . Left rotator cuff tear arthropathy 07/12/2015  . Left shoulder pain  04/06/2015  . Frozen shoulder 01/03/2015  . Cough variant asthma 12/08/2013  . Upper airway cough syndrome 10/27/2013  . Dyspnea 02/03/2013  . GERD 07/06/2009  . HIATAL HERNIA 07/06/2009  . DIVERTICULOSIS, COLON 07/06/2009  . HEPATIC CYST 07/06/2009  . COLONIC POLYPS, HX OF 07/06/2009  . MIXED HEARING LOSS BILATERAL 12/13/2008  . Essential hypertension 06/19/2008  . MITRAL VALVE PROLAPSE 06/19/2008  . LBBB (left bundle branch block) 06/19/2008  . Hyperlipidemia 08/12/2007  . Hypothyroidism 12/02/2006  . Coronary atherosclerosis 12/02/2006  . Chronic diastolic heart failure ([place]) 12/02/2006    Past Surgical History:  Procedure Laterality Date  . CARDIAC CATHETERIZATION  04-18-01  . COLONOSCOPY    . HERNIA REPAIR    . PACEMAKER IMPLANT Left 05/15/2016   SJM Assirity MRI dual chamber PPM implanted by Dr Rayann Heman for CHB  . THYROIDECTOMY    . TONSILLECTOMY AND ADENOIDECTOMY    . TOTAL ABDOMINAL HYSTERECTOMY W/ BILATERAL SALPINGOOPHORECTOMY      OB History    No data available       Home Medications    Prior to Admission medications   Medication Sig Start Date End Date Taking? Authorizing Provider  albuterol (PROVENTIL HFA;VENTOLIN HFA) 108 (90 Base) MCG/ACT inhaler Inhale 1-2 puffs into the lungs every 6 (six) hours as needed for wheezing or shortness of breath. 03/05/17   Sherwood Gambler, MD  aspirin 81 MG tablet  Take 81 mg by mouth daily.    [provider]  azithromycin (ZITHROMAX) 250 MG tablet Take 1 tablet (250 mg total) by mouth daily. Take first 2 tablets together, then 1 every day until finished. 03/05/17   Sherwood Gambler, MD  BIOTIN PO Take 1 tablet by mouth daily.    [provider]  bisoprolol (ZEBETA) 10 MG tablet Take 1 tablet (10 mg total) by mouth daily. 07/12/16   [name], MD  Cholecalciferol (VITAMIN D3 PO) Take 2 capsules by mouth daily.    [provider]  furosemide (LASIX) 40 MG tablet Take 1 tablet (40 mg total) by mouth  daily. 02/22/17   Minus Breeding, MD  ipratropium (ATROVENT) 0.03 % nasal spray Place 2 sprays into both nostrils 2 (two) times daily. Do not use for more than 5days. 03/16/16   Nche, Charlene Brooke, NP  Multiple Vitamins-Minerals (CENTRUM SILVER PO) Take 1 tablet by mouth daily.    [provider]  Omega-3 Fatty Acids (FISH OIL PO) Take 1 capsule by mouth daily.    [provider]  pantoprazole (PROTONIX) 20 MG tablet TAKE 1 TABLET BY MOUTH ONCE DAILY 02/15/17   [name], MD  PROAIR HFA 108 [phone] Base) MCG/ACT inhaler INHALE TWO PUFFS BY MOUTH EVERY 6 HOURS AS NEEDED FOR WHEEZING OR SHORTNESS OF BREATH 04/26/16   [name], MD  SYNTHROID 88 MCG tablet Take 1 tablet (88 mcg total) by mouth daily before breakfast. 07/12/16   Burns, Claudina Lick, MD  vitamin C (ASCORBIC ACID) 500 MG tablet Take 500 mg by mouth daily.    [provider]    Family History Family History  Problem Relation Age of Onset  . Coronary artery disease Unknown        family hx of 1st degree relative <50  . Diabetes Unknown        family hx of  . Other Unknown        cardiovascular disorder family hx of  . Other Unknown        neurological disorder family hx of  . Other Unknown        respiratory disease family hx of  . Heart attack Daughter   . Heart Problems Unknown        all children  . Sudden death Son   . Heart disease Brother   . Heart disease Sister   . Colon cancer Neg Hx   . Colon polyps Neg Hx     Social History Social History   Tobacco Use  . Smoking status: Never Smoker  . Smokeless tobacco: Never Used  Substance Use Topics  . Alcohol use: No  . Drug use: No     Allergies   Adhesive [tape]; Ace inhibitors; Atorvastatin; Clindamycin; Codeine; Latex; Penicillins; and Simvastatin   Review of Systems Review of Systems  HENT: Positive for congestion.   Respiratory: Positive for cough, chest tightness and shortness of breath.   Cardiovascular: Positive for leg  swelling (chronic LLE, for 2-3 years). Negative for chest pain.  Gastrointestinal: Negative for abdominal pain and vomiting.  All other systems reviewed and are negative.    Physical Exam Updated Vital Signs BP (!) 130/58   Pulse 69   Temp 98.2 F (36.8 C) (Oral)   Resp 18   Ht 5\' 2"  (1.575 m)   Wt 81.2 kg (179 lb)   SpO2 99%   BMI 32.74 kg/m   Physical Exam  Constitutional: She is  oriented to person, place, and time. She appears well-developed and well-nourished. No distress.  HENT:  Head: Normocephalic and atraumatic.  Right Ear: External ear normal.  Left Ear: External ear normal.  Nose: Nose normal.  Eyes: Right eye exhibits no discharge. Left eye exhibits no discharge.  Neck: Neck supple.  Cardiovascular: Normal rate, regular rhythm and normal heart sounds.  Pulmonary/Chest: Effort normal. No accessory muscle usage or stridor. No tachypnea. No respiratory distress. She has no decreased breath sounds. She has wheezes (diffuse, expiratory, mild).  Speaks in complete sentences without difficulty.  Abdominal: Soft. There is no tenderness.  Musculoskeletal: She exhibits no edema.  Slight left ankle swelling compared to right  Neurological: She is alert and oriented to person, place, and time.  Skin: Skin is warm and dry. She is not diaphoretic.  Nursing note and vitals reviewed.    ED Treatments / Results  Labs (all labs ordered are listed, but only abnormal results are displayed) Labs Reviewed - No data to display  EKG  EKG Interpretation  Date/Time:  Tuesday March 05 2017 15:26:19 EST Ventricular Rate:  72 PR Interval:    QRS Duration: 158 QT Interval:  482 QTC Calculation: 528 R Axis:   -82 Text Interpretation:  Sinus rhythm Ventricular premature complex Prolonged PR interval Nonspecific IVCD with LAD LVH with secondary repolarization abnormality Anterolateral infarct, old no significant change since Mar 2018 Confirmed by Sherwood Gambler [phone]) on 03/05/2017  3:44:54 PM       Radiology Dg Chest 2 View  Result Date: 03/05/2017 CLINICAL DATA:  Cough with bloody sputum x1 week. EXAM: CHEST  2 VIEW COMPARISON:  [facility] dating back through 03/16/2016 FINDINGS: Stable borderline cardiomegaly. Minimal aortic atherosclerosis. Left-sided pacemaker apparatus with right atrial and right ventricular leads appear stable. Calcified granulomata noted as before within the left lung. Both lungs are clear. The visualized skeletal structures are unremarkable. IMPRESSION: 1. No active cardiopulmonary disease. 2. Stable pulmonary granulomata. Electronically Signed   By: Ashley Royalty M.D.   On: 03/05/2017 14:36    Procedures Procedures (including critical care time)  Medications Ordered in ED Medications  ipratropium-albuterol (DUONEB) 0.5-2.5 (3) MG/3ML nebulizer solution 3 mL (3 mLs Nebulization Given 03/05/17 1459)     Initial Impression / Assessment and Plan / ED Course  I have reviewed the triage vital signs and the nursing notes.  Pertinent labs & imaging results that were available during my care of the patient were reviewed by me and considered in my medical decision making (see chart for details).     Patient overall appears well.  She has diffuse mild wheezing and states she feels dramatically better after a DuoNeb.  She does not have any further increased work of breathing or hypoxia.  She does not have a known history of COPD or asthma.  This is likely acute bronchitis.  She has an albuterol inhaler at home, I will refill this for further treatment.  Instructed on how to use the inhaler and that she can use this up to every 4-6 hours.  Treat with azithromycin given length of symptoms. F/u with PCP. Return precautions  Final Clinical Impressions(s) / ED Diagnoses   Final diagnoses:  Acute bronchitis, unspecified organism    ED Discharge Orders        Ordered    albuterol (PROVENTIL HFA;VENTOLIN HFA) 108 (90 Base) MCG/ACT inhaler  Every 6 hours PRN       03/05/17 1558    azithromycin (ZITHROMAX) 250 MG tablet  Daily  03/05/17 Speed, MD 03/05/17 1601

## 2017-03-05 NOTE — ED Triage Notes (Signed)
Onset 1 week   Cough head congestion  Fever

## 2017-03-06 ENCOUNTER — Telehealth: Payer: Self-pay | Admitting: Cardiology

## 2017-03-06 ENCOUNTER — Telehealth: Payer: Self-pay | Admitting: *Deleted

## 2017-03-06 NOTE — Telephone Encounter (Signed)
-----   Message from Corinna Lines sent at 03/04/2017 11:19 AM EST -----  Hebert Soho  I have cancelled the AAA and faxed the information to [facility] to schedule the ultrasound.  [place] ----- Message ----- From: Vennie Homans Sent: 03/01/2017   4:02 PM To: [facility] Admin Pool  Please cancel AAA on pt for January and schedule U/S abdomen  Thanks

## 2017-03-06 NOTE — Telephone Encounter (Signed)
Faxed insurance cards and order to [facility] to schedule abdominal ultrasound.

## 2017-03-06 NOTE — Telephone Encounter (Signed)
Call and spoke with pt letting her know someone from [facility] will call and schedule test, pt voice understanding.

## 2017-03-07 ENCOUNTER — Telehealth: Payer: Self-pay | Admitting: Cardiology

## 2017-03-07 NOTE — Telephone Encounter (Signed)
Called and spoke with Denton Ar at [facility] regarding an abdominal ultrasound for this patient.  She stated that they will be calling the patient today to schedule this.

## 2017-03-08 LAB — CUP PACEART REMOTE DEVICE CHECK
Battery Remaining Percentage: 95.5 %
Battery Voltage: 2.99 V
Brady Statistic RA Percent Paced: 60 %
Brady Statistic RV Percent Paced: 96 %
Date Time Interrogation Session: 20181213103101
Implantable Lead Implant Date: 20180313
Implantable Lead Location: 753860
Implantable Pulse Generator Implant Date: 20180313
Lead Channel Pacing Threshold Amplitude: 1.5 V
Lead Channel Pacing Threshold Pulse Width: 0.5 ms
Lead Channel Sensing Intrinsic Amplitude: 2.4 mV
Lead Channel Setting Pacing Pulse Width: 0.5 ms
Lead Channel Setting Sensing Sensitivity: 4 mV
MDC IDC LEAD IMPLANT DT: 20180313
MDC IDC LEAD LOCATION: 753859
MDC IDC MSMT BATTERY REMAINING LONGEVITY: 108 mo
MDC IDC MSMT LEADCHNL RA IMPEDANCE VALUE: 460 Ohm
MDC IDC MSMT LEADCHNL RV IMPEDANCE VALUE: 580 Ohm
MDC IDC MSMT LEADCHNL RV PACING THRESHOLD AMPLITUDE: 0.75 V
MDC IDC MSMT LEADCHNL RV PACING THRESHOLD PULSEWIDTH: 0.5 ms
MDC IDC MSMT LEADCHNL RV SENSING INTR AMPL: 12 mV
MDC IDC SET LEADCHNL RA PACING AMPLITUDE: 2.5 V
MDC IDC SET LEADCHNL RV PACING AMPLITUDE: 1 V
MDC IDC STAT BRADY AP VP PERCENT: 63 %
MDC IDC STAT BRADY AP VS PERCENT: 1 %
MDC IDC STAT BRADY AS VP PERCENT: 33 %
MDC IDC STAT BRADY AS VS PERCENT: 1 %
Pulse Gen Model: 2272
Pulse Gen Serial Number: 8005625

## 2017-03-18 NOTE — Progress Notes (Signed)
Subjective:    Patient ID: Beverly Kim, female    DOB: 1924/09/16, 84 y.o.   MRN: [id number]  HPI The patient is here for follow up from the hospital.   ED 03/05/17 for cough for 1 week.  She had chest tightness, subjective fever, cough that was productive of green-brown sputum and streaks of blood, mild sob, head congestion.  She was taking mucinex. She had diffuse wheezing on exam.  cxr showed no acute disease, EKG showed no acute change. She received a neb treatment and had improvement in her wheezing.  She was diagnosed with acute bronchitis and prescribed an albuterol inhaler and a zpak.  She is here for follow up.   She completed the antibiotic.  She denies any fever or chills.  She denies cough, wheeze and SOB.  She denies cold symptoms.    She intermittently feels like she can't breath. This started several months ago, but it is more obvious since the bronchitis.  She wonders if her pacemaker is working or not.  She noticed this more over the past few weeks.  She may be sitting when this occurs - it just feels like her breath is going to stop - like she is not getting oxygen.  She notices this every day.  She notices when she is at rest.  She feels like she can't breath for 15-30 seconds.  All of a sudden it goes away.  It feels like something closes off her breathing.  She denies pain.  She wonders if it is related to her GERD.   GERD:  She rarely feels gerd.  She is taking protonix daily and wants to get off of it.  She wonders if she can take something else.  She typically only has heartburn if she eats certain foods and she tries to avoid them.  She knows taking the pantoprazole for a long period of time is not good for her.  Overall she does not feel great.  She knows she is approaching 74 and that is likely influencing how she feels.  She knows because cardiology has told her that her heart may be contributing to some of why she does not feel well.  Hypertension: She is taking  her medication daily as prescribed.  She is compliant with a low-sodium diet.  She denies any chest pain and palpitations.  She does have some lower extremity edema.  She is following with cardiology for her fluid overload and her water pill was increased.  She will have follow-up blood work done for them.  Medications and allergies reviewed with patient and updated if appropriate.  Patient Active Problem List   Diagnosis Date Noted  . Non-rheumatic mitral regurgitation 07/12/2016  . Complete heart block ([place]) 05/14/2016  . Bradycardia 05/14/2016  . Wheezing 03/20/2016  . Prediabetes 01/05/2016  . Degenerative disc disease, cervical 08/23/2015  . Degenerative disc disease, lumbar 08/23/2015  . Left rotator cuff tear arthropathy 07/12/2015  . Left shoulder pain 04/06/2015  . Frozen shoulder 01/03/2015  . Cough variant asthma 12/08/2013  . Upper airway cough syndrome 10/27/2013  . Dyspnea 02/03/2013  . GERD 07/06/2009  . HIATAL HERNIA 07/06/2009  . DIVERTICULOSIS, COLON 07/06/2009  . HEPATIC CYST 07/06/2009  . COLONIC POLYPS, HX OF 07/06/2009  . MIXED HEARING LOSS BILATERAL 12/13/2008  . Essential hypertension 06/19/2008  . MITRAL VALVE PROLAPSE 06/19/2008  . LBBB (left bundle branch block) 06/19/2008  . Hyperlipidemia 08/12/2007  . Hypothyroidism 12/02/2006  . Coronary atherosclerosis  12/02/2006  . Chronic diastolic heart failure ([place]) 12/02/2006    Current Outpatient Medications on File Prior to Visit  Medication Sig Dispense Refill  . albuterol (PROVENTIL HFA;VENTOLIN HFA) 108 (90 Base) MCG/ACT inhaler Inhale 1-2 puffs into the lungs every 6 (six) hours as needed for wheezing or shortness of breath. 1 Inhaler 0  . aspirin 81 MG tablet Take 81 mg by mouth daily.    Marland Kitchen BIOTIN PO Take 1 tablet by mouth daily.    . bisoprolol (ZEBETA) 10 MG tablet Take 1 tablet (10 mg total) by mouth daily. 90 tablet 1  . Cholecalciferol (VITAMIN D3 PO) Take 2 capsules by mouth daily.    .  furosemide (LASIX) 40 MG tablet Take 1 tablet (40 mg total) by mouth daily. 30 tablet 11  . ipratropium (ATROVENT) 0.03 % nasal spray Place 2 sprays into both nostrils 2 (two) times daily. Do not use for more than 5days. 30 mL 0  . Multiple Vitamins-Minerals (CENTRUM SILVER PO) Take 1 tablet by mouth daily.    . Omega-3 Fatty Acids (FISH OIL PO) Take 1 capsule by mouth daily.    . pantoprazole (PROTONIX) 20 MG tablet TAKE 1 TABLET BY MOUTH ONCE DAILY 90 tablet 3  . SYNTHROID 88 MCG tablet Take 1 tablet (88 mcg total) by mouth daily before breakfast. 90 tablet 1  . vitamin C (ASCORBIC ACID) 500 MG tablet Take 500 mg by mouth daily.     No current facility-administered medications on file prior to visit.     Past Medical History:  Diagnosis Date  . Atrial fibrillation ([place]) 08/16/2016   observed on PPM interrogation, asymptomatic, chad2vasc score is 4.  will consider anticoagulation  . CHF (congestive heart failure) ([place])   . Colon polyps   . Coronary artery disease   . Diverticulosis   . Dyspnea   . GERD (gastroesophageal reflux disease)   . Hearing loss of both ears   . Hepatic cyst   . Hiatal hernia   . Hyperlipidemia   . Hypertension   . Hypothyroidism   . Low back pain   . Mitral valve disorders(424.0)   . Other left bundle branch block   . Palpitations   . URI (upper respiratory infection)     Past Surgical History:  Procedure Laterality Date  . CARDIAC CATHETERIZATION  04-18-01  . COLONOSCOPY    . HERNIA REPAIR    . PACEMAKER IMPLANT Left 05/15/2016   SJM Assirity MRI dual chamber PPM implanted by Dr Rayann Heman for CHB  . THYROIDECTOMY    . TONSILLECTOMY AND ADENOIDECTOMY    . TOTAL ABDOMINAL HYSTERECTOMY W/ BILATERAL SALPINGOOPHORECTOMY      Social History   Socioeconomic History  . Marital status: Widowed    Spouse name: Not on file  . Number of children: 4  . Years of education: Not on file  . Highest education level: Not on file  Social Needs  . Financial  resource strain: Not on file  . Food insecurity - worry: Not on file  . Food insecurity - inability: Not on file  . Transportation needs - medical: Not on file  . Transportation needs - non-medical: Not on file  Occupational History  . Occupation: retired  Tobacco Use  . Smoking status: Never Smoker  . Smokeless tobacco: Never Used  Substance and Sexual Activity  . Alcohol use: No  . Drug use: No  . Sexual activity: Not on file  Other Topics Concern  . Not on file  Social History Narrative  . Not on file    Family History  Problem Relation Age of Onset  . Coronary artery disease Unknown        family hx of 1st degree relative <50  . Diabetes Unknown        family hx of  . Other Unknown        cardiovascular disorder family hx of  . Other Unknown        neurological disorder family hx of  . Other Unknown        respiratory disease family hx of  . Heart attack Daughter   . Heart Problems Unknown        all children  . Sudden death Son   . Heart disease Brother   . Heart disease Sister   . Colon cancer Neg Hx   . Colon polyps Neg Hx     Review of Systems  Constitutional: Positive for fatigue (low energy after taking medication). Negative for appetite change, chills and fever.  HENT: Negative for congestion and sore throat.   Respiratory: Negative for cough, shortness of breath and wheezing (family members feel it. ).   Cardiovascular: Positive for leg swelling (occ, left > right). Negative for chest pain and palpitations.  Neurological: Positive for headaches (migraines).       Objective:   Vitals:   03/19/17 1555  BP: 140/68  Pulse: 65  Resp: 16  Temp: 97.6 F (36.4 C)  SpO2: 97%   Wt Readings from Last 3 Encounters:  03/19/17 181 lb (82.1 kg)  03/05/17 179 lb (81.2 kg)  02/22/17 184 lb (83.5 kg)   Body mass index is 33.11 kg/m.   Physical Exam    Constitutional: Appears well-developed and well-nourished. No distress.  HENT:  Head: Normocephalic  and atraumatic.  Neck: Neck supple. No tracheal deviation present. No thyromegaly present.  No cervical lymphadenopathy Cardiovascular: Normal rate, regular rhythm and normal heart sounds.   No murmur heard. No carotid bruit .  Trace b/l LE edema Pulmonary/Chest: Effort normal and breath sounds normal. No respiratory distress. No has no wheezes. No rales.  Skin: Skin is warm and dry. Not diaphoretic.  Psychiatric: Normal mood and affect. Behavior is normal.      Assessment & Plan:    See Problem List for Assessment and Plan of chronic medical problems.

## 2017-03-19 ENCOUNTER — Encounter: Payer: Self-pay | Admitting: Internal Medicine

## 2017-03-19 ENCOUNTER — Ambulatory Visit (INDEPENDENT_AMBULATORY_CARE_PROVIDER_SITE_OTHER): Payer: Medicare Other | Admitting: Internal Medicine

## 2017-03-19 DIAGNOSIS — J209 Acute bronchitis, unspecified: Secondary | ICD-10-CM | POA: Diagnosis not present

## 2017-03-19 DIAGNOSIS — K219 Gastro-esophageal reflux disease without esophagitis: Secondary | ICD-10-CM

## 2017-03-19 DIAGNOSIS — I10 Essential (primary) hypertension: Secondary | ICD-10-CM

## 2017-03-19 DIAGNOSIS — R0600 Dyspnea, unspecified: Secondary | ICD-10-CM | POA: Diagnosis not present

## 2017-03-19 MED ORDER — RANITIDINE HCL 150 MG PO TABS: 150.0000 mg | ORAL_TABLET | Freq: Two times a day (BID) | ORAL | 5 refills | Status: DC

## 2017-03-19 NOTE — Assessment & Plan Note (Addendum)
Controlled Wants to get off of the protonix Start zantac 150 mg BID - then stop protonix Advised her to let me know if her heartburn is not controlled-may still need PPI

## 2017-03-19 NOTE — Assessment & Plan Note (Signed)
Here for follow-up in the emergency room Completed Z-Pak Symptoms resolved No further treatment or evaluation needed

## 2017-03-19 NOTE — Patient Instructions (Signed)
  Medications reviewed and updated.  Changes include stopping pantoprazole and starting ranitidine 150 mg twice daily.  Your prescription(s) have been submitted to your pharmacy. Please take as directed and contact our office if you believe you are having problem(s) with the medication(s).   Please followup in May as scheduled.

## 2017-03-19 NOTE — Assessment & Plan Note (Signed)
She does have some chronic dyspnea on exertion, which is likely multifactorial-cardiac related, deconditioning, obesity She is following with cardiology She is experiencing shortness of breath at rest that lasts 15-30 seconds where she just feels she is not getting enough oxygen-difficult to say what is causing the symptoms-possible laryngeal spasm, atypical GERD She deferred pulmonary referral at this time She will try to make sure her heartburn is well controlled She will discussed with cardiology

## 2017-03-19 NOTE — Assessment & Plan Note (Signed)
Blood pressure reasonably controlled Following with cardiology for heart failure Continue current medications

## 2017-03-21 ENCOUNTER — Inpatient Hospital Stay (HOSPITAL_COMMUNITY): Admission: RE | Admit: 2017-03-21 | Payer: Medicare Other | Source: Ambulatory Visit

## 2017-04-01 ENCOUNTER — Ambulatory Visit
Admission: RE | Admit: 2017-04-01 | Discharge: 2017-04-01 | Disposition: A | Discharge status: Home / Self Care | Payer: Medicare Other | Source: Ambulatory Visit | Attending: Cardiology | Admitting: Cardiology

## 2017-04-01 DIAGNOSIS — R188 Other ascites: Secondary | ICD-10-CM

## 2017-04-01 DIAGNOSIS — K8020 Calculus of gallbladder without cholecystitis without obstruction: Secondary | ICD-10-CM | POA: Diagnosis not present

## 2017-04-08 ENCOUNTER — Ambulatory Visit: Payer: Medicare Other | Admitting: Cardiology

## 2017-04-08 ENCOUNTER — Encounter: Payer: Self-pay | Admitting: Cardiology

## 2017-04-08 VITALS — BP 146/70 | HR 86 | Ht 62.0 in | Wt 183.0 lb

## 2017-04-08 DIAGNOSIS — Z79899 Other long term (current) drug therapy: Secondary | ICD-10-CM | POA: Diagnosis not present

## 2017-04-08 DIAGNOSIS — I2510 Atherosclerotic heart disease of native coronary artery without angina pectoris: Secondary | ICD-10-CM

## 2017-04-08 DIAGNOSIS — I5032 Chronic diastolic (congestive) heart failure: Secondary | ICD-10-CM

## 2017-04-08 DIAGNOSIS — M79605 Pain in left leg: Secondary | ICD-10-CM | POA: Diagnosis not present

## 2017-04-08 DIAGNOSIS — R0989 Other specified symptoms and signs involving the circulatory and respiratory systems: Secondary | ICD-10-CM | POA: Diagnosis not present

## 2017-04-08 DIAGNOSIS — R0600 Dyspnea, unspecified: Secondary | ICD-10-CM | POA: Diagnosis not present

## 2017-04-08 DIAGNOSIS — I442 Atrioventricular block, complete: Secondary | ICD-10-CM | POA: Diagnosis not present

## 2017-04-08 DIAGNOSIS — M79604 Pain in right leg: Secondary | ICD-10-CM | POA: Diagnosis not present

## 2017-04-08 DIAGNOSIS — I779 Disorder of arteries and arterioles, unspecified: Secondary | ICD-10-CM | POA: Diagnosis not present

## 2017-04-08 DIAGNOSIS — M79606 Pain in leg, unspecified: Secondary | ICD-10-CM

## 2017-04-08 DIAGNOSIS — G8929 Other chronic pain: Secondary | ICD-10-CM | POA: Insufficient documentation

## 2017-04-08 DIAGNOSIS — I739 Peripheral vascular disease, unspecified: Secondary | ICD-10-CM

## 2017-04-08 NOTE — Assessment & Plan Note (Signed)
Chronic, unclear etiology with unremarkable cardiac and pulmonary work up. No ascites on abdominal US.

## 2017-04-08 NOTE — Assessment & Plan Note (Signed)
Echo April 2018- mild MR. EF 60-65%

## 2017-04-08 NOTE — Patient Instructions (Signed)
Your physician has requested that you have a lower extremity arterial duplex. This test is an ultrasound of the arteries in the legs. It looks at arterial blood flow in the legs. Allow one hour for Lower Arterial scans. There are no restrictions or special instructions  Your physician recommends that you schedule a follow-up appointment with Dr. Percival Spanish - first available (after doppler study)

## 2017-04-08 NOTE — Assessment & Plan Note (Signed)
The pt read the PV poster in the exam room and feels she has those symptoms. On exam she has diminished pulses though the exam was difficult secondary to her moving her foot. Check LEA dopplers.

## 2017-04-08 NOTE — Assessment & Plan Note (Signed)
Incidental finding on neck CT for dental work. No history of stroke or TIA. No bruit on exam

## 2017-04-08 NOTE — Assessment & Plan Note (Signed)
70% LAD at cath 2003. Myoview low risk 2013.

## 2017-04-08 NOTE — Progress Notes (Signed)
04/08/2017 Beverly Kim   1924/03/24  [id number]  Primary Physician [name], MD Primary Cardiologist: Dr Hochrein/ Dr Rayann Heman  HPI:  84 y/o (looks younger!) female with a long history of dyspnea. She has been followed by cardiology and pulmonary. Remote cath showed non critical CADS with a 70% LAD. <Myoview in 2013 was low risk. She had a pacemaker implanted for CHB in the past. Dr Percival Spanish notes she has had PAF but it not been sustained or frequent so she is not anticoagulated. Dr Percival Spanish recently saw her in the office. The pt complained of dyspnea. She was ambulated and her O2 sat monitored. Though her O2 sat remained stable the pt did have dyspnea. She had a BNP in Nov 2018 that was 500. Dr Percival Spanish added Lasix and ordered an abdominal US to r/o ascites.   She is in the office today for follow up. She is not sure the Lasix helped an. Her wgt is about the same. She read the PV signs and symptoms poster and felt she had those symptoms. She c/o leg and calf pain both at rest and with walking. Exam was difficult because she was unable to keep her foot still but she clearly did not have bounding LE pulses.    Current Outpatient Medications  Medication Sig Dispense Refill  . albuterol (PROVENTIL HFA;VENTOLIN HFA) 108 (90 Base) MCG/ACT inhaler Inhale 1-2 puffs into the lungs every 6 (six) hours as needed for wheezing or shortness of breath. 1 Inhaler 0  . aspirin 81 MG tablet Take 81 mg by mouth daily.    Marland Kitchen BIOTIN PO Take 1 tablet by mouth daily.    . bisoprolol (ZEBETA) 10 MG tablet Take 1 tablet (10 mg total) by mouth daily. 90 tablet 1  . Cholecalciferol (VITAMIN D3 PO) Take 2 capsules by mouth daily.    . furosemide (LASIX) 40 MG tablet Take 1 tablet (40 mg total) by mouth daily. 30 tablet 11  . ipratropium (ATROVENT) 0.03 % nasal spray Place 2 sprays into both nostrils 2 (two) times daily. Do not use for more than 5days. 30 mL 0  . Multiple Vitamins-Minerals (CENTRUM SILVER PO)  Take 1 tablet by mouth daily.    . Omega-3 Fatty Acids (FISH OIL PO) Take 1 capsule by mouth daily.    . ranitidine (ZANTAC) 150 MG tablet Take 1 tablet (150 mg total) by mouth 2 (two) times daily. 60 tablet 5  . SYNTHROID 88 MCG tablet Take 1 tablet (88 mcg total) by mouth daily before breakfast. 90 tablet 1  . vitamin C (ASCORBIC ACID) 500 MG tablet Take 500 mg by mouth daily.     No current facility-administered medications for this visit.     Allergies  Allergen Reactions  . Adhesive [Tape] Itching, Dermatitis, Rash and Other (See Comments)    Blisters and "skin bubbles"  . Ace Inhibitors Cough  . Atorvastatin Other (See Comments)    REACTION: Reaction not known  . Clindamycin Other (See Comments)    Unknown  . Codeine Other (See Comments)    Unknown  . Latex Other (See Comments)    Unknown  . Penicillins Other (See Comments)    Unknown  . Simvastatin Other (See Comments)    REACTION: fatigue    Past Medical History:  Diagnosis Date  . Atrial fibrillation ([place]) 08/16/2016   observed on PPM interrogation, asymptomatic, chad2vasc score is 4.  will consider anticoagulation  . CHF (congestive heart failure) ([place])   .  Colon polyps   . Coronary artery disease   . Diverticulosis   . Dyspnea   . GERD (gastroesophageal reflux disease)   . Hearing loss of both ears   . Hepatic cyst   . Hiatal hernia   . Hyperlipidemia   . Hypertension   . Hypothyroidism   . Low back pain   . Mitral valve disorders(424.0)   . Other left bundle branch block   . Palpitations   . URI (upper respiratory infection)     Social History   Socioeconomic History  . Marital status: Widowed    Spouse name: Not on file  . Number of children: 4  . Years of education: Not on file  . Highest education level: Not on file  Social Needs  . Financial resource strain: Not on file  . Food insecurity - worry: Not on file  . Food insecurity - inability: Not on file  . Transportation needs - medical:  Not on file  . Transportation needs - non-medical: Not on file  Occupational History  . Occupation: retired  Tobacco Use  . Smoking status: Never Smoker  . Smokeless tobacco: Never Used  Substance and Sexual Activity  . Alcohol use: No  . Drug use: No  . Sexual activity: Not on file  Other Topics Concern  . Not on file  Social History Narrative  . Not on file     Family History  Problem Relation Age of Onset  . Coronary artery disease Unknown        family hx of 1st degree relative <50  . Diabetes Unknown        family hx of  . Other Unknown        cardiovascular disorder family hx of  . Other Unknown        neurological disorder family hx of  . Other Unknown        respiratory disease family hx of  . Heart attack Daughter   . Heart Problems Unknown        all children  . Sudden death Son   . Heart disease Brother   . Heart disease Sister   . Colon cancer Neg Hx   . Colon polyps Neg Hx      Review of Systems: General: negative for chills, fever, night sweats or weight changes.  Cardiovascular: negative for chest pain, dyspnea on exertion, edema, orthopnea, palpitations, paroxysmal nocturnal dyspnea or shortness of breath Dermatological: negative for rash Respiratory: negative for cough or wheezing Urologic: negative for hematuria Abdominal: negative for nausea, vomiting, diarrhea, bright red blood per rectum, melena, or hematemesis Neurologic: negative for visual changes, syncope, or dizziness All other systems reviewed and are otherwise negative except as noted above.    Blood pressure (!) 146/70, pulse 86, height 5\' 2"  (1.575 m), weight 183 lb (83 kg), SpO2 97 %.  General appearance: alert, cooperative, no distress, moderately obese and looks younger than stated age Neck: no carotid bruit and no JVD Lungs: clear to auscultation bilaterally Heart: regular rate and rhythm Extremities: no edema Pulses: diminnished LEA pulses but difficult exam Skin: Skin  color, texture, turgor normal. No rashes or lesions Neurologic: Grossly normal  ASSESSMENT AND PLAN:   Dyspnea Chronic, unclear etiology with unremarkable cardiac and pulmonary work up. No ascites on abdominal US.  Coronary atherosclerosis 70% LAD at cath 2003. Myoview low risk 2013.  Complete heart block ([facility]) s/p PPM 7/32/20  Chronic diastolic heart failure ([place]) Echo April 2018- mild MR.  EF 60-65%   Carotid disease, bilateral ([place]) Incidental finding on neck CT for dental work. No history of stroke or TIA. No bruit on exam  Leg pain The pt read the PV poster in the exam room and feels she has those symptoms. On exam she has diminished pulses though the exam was difficult secondary to her moving her foot. Check LEA dopplers.    PLAN  Check f/u BNP and BMP. I also ordered LEA dopplers. F/U Dr Percival Spanish.   Kerin Ransom PA-C 04/08/2017 2:31 PM

## 2017-04-08 NOTE — Assessment & Plan Note (Signed)
s/p PPM 05/15/16

## 2017-04-09 ENCOUNTER — Other Ambulatory Visit: Payer: Self-pay | Admitting: Cardiology

## 2017-04-09 DIAGNOSIS — R0989 Other specified symptoms and signs involving the circulatory and respiratory systems: Secondary | ICD-10-CM

## 2017-04-09 DIAGNOSIS — M79606 Pain in leg, unspecified: Secondary | ICD-10-CM

## 2017-04-09 LAB — BASIC METABOLIC PANEL
BUN/Creatinine Ratio: 22 (ref 12–28)
BUN: 19 mg/dL (ref 10–36)
CO2: 20 mmol/L (ref 20–29)
Calcium: 10.1 mg/dL (ref 8.7–10.3)
Chloride: 101 mmol/L (ref 96–106)
Creatinine, Ser: 0.88 mg/dL (ref 0.57–1.00)
GFR calc Af Amer: 67 mL/min/{1.73_m2} (ref >59)
GFR calc non Af Amer: 58 mL/min/{1.73_m2} — ABNORMAL LOW (ref >59)
Glucose: 119 mg/dL — ABNORMAL HIGH (ref 65–99)
Potassium: 4.8 mmol/L (ref 3.5–5.2)
Sodium: 142 mmol/L (ref 134–144)

## 2017-04-09 LAB — PRO B NATRIURETIC PEPTIDE: NT-Pro BNP: 1634 pg/mL — ABNORMAL HIGH (ref 0–738)

## 2017-04-10 ENCOUNTER — Telehealth: Payer: Self-pay | Admitting: Cardiology

## 2017-04-10 NOTE — Telephone Encounter (Signed)
Patient states that she is returning call from office

## 2017-04-10 NOTE — Telephone Encounter (Signed)
Returned the call to the patient. She was not sure who had called. She was informed that there was no note of someone trying to call her at this time.

## 2017-04-16 ENCOUNTER — Telehealth: Payer: Self-pay | Admitting: Internal Medicine

## 2017-04-16 ENCOUNTER — Ambulatory Visit (HOSPITAL_COMMUNITY)
Admission: RE | Admit: 2017-04-16 | Discharge: 2017-04-16 | Disposition: A | Discharge status: Home / Self Care | Payer: Medicare Other | Source: Ambulatory Visit | Attending: Internal Medicine | Admitting: Internal Medicine

## 2017-04-16 DIAGNOSIS — R0989 Other specified symptoms and signs involving the circulatory and respiratory systems: Secondary | ICD-10-CM | POA: Insufficient documentation

## 2017-04-16 DIAGNOSIS — M79606 Pain in leg, unspecified: Secondary | ICD-10-CM | POA: Insufficient documentation

## 2017-04-16 DIAGNOSIS — R9439 Abnormal result of other cardiovascular function study: Secondary | ICD-10-CM | POA: Insufficient documentation

## 2017-04-16 DIAGNOSIS — I714 Abdominal aortic aneurysm, without rupture: Secondary | ICD-10-CM | POA: Insufficient documentation

## 2017-04-16 NOTE — Telephone Encounter (Signed)
Spoke with pt to clarify that she is having Vas US done today not echo and that will tell us if she has a blood clot that could be causing the swelling.

## 2017-04-16 NOTE — Telephone Encounter (Signed)
Copied from [place] [id number]. Topic: Quick Communication - See Telephone Encounter >> Apr 16, 2017 10:23 AM Arletha Grippe wrote: CRM for notification. See Telephone encounter for:   04/16/17. Pt asking for call back from cma. She is having echo today and would like call back before then.  She would not share any other details.  Cb is [phone]

## 2017-04-19 ENCOUNTER — Telehealth: Payer: Self-pay | Admitting: Cardiology

## 2017-04-19 NOTE — Telephone Encounter (Signed)
Beverly Kim is calling to get her test results . Thanks

## 2017-04-19 NOTE — Telephone Encounter (Signed)
Patient made aware of results and verbalized her understanding. She will increase her lasix to 80 mg for three days and then go back to 40 mg daily. She will keep her appointment on 3/8 with Dr. Percival Spanish.  Notes recorded by Beverly Quan, PA-C on 04/10/2017 at 3:35 PM EST I reviewed Beverly Kim's labs. I would suggest she increase her Lasix to 80 mg daily for 3 days, then back to 40 mg daily. Keep f/u with Dr Percival Spanish 05/10/17.

## 2017-04-20 NOTE — Telephone Encounter (Signed)
Patient called and made aware to hold off on increasing her lasix until the patient's instructions could be verified.  Name on result note needs to be verified. Message routed to provider.

## 2017-04-22 NOTE — Telephone Encounter (Signed)
Yes the message was ment for Ms maiolo- increase Lasix to 80 mg daily x 3 days then back to 40 mg daily- keep f/u with Dr Percival Spanish.  Kerin Ransom PA-C 04/22/2017 7:50 AM

## 2017-04-22 NOTE — Telephone Encounter (Addendum)
Patient made aware to go ahead and increase the lasix to 80 mg for three days and then go back to 40 mg daily. She will keep her follow up with Dr. Percival Spanish on 3/8.   She has also been made aware of the doppler results:  Notes recorded by Erlene Quan, PA-C on 04/18/2017 at 3:34 PM EST Please let the patient know her dopplers were normal, circulation to her legs is fine.   She has verbalized her understanding.

## 2017-04-23 ENCOUNTER — Other Ambulatory Visit: Payer: Self-pay | Admitting: Internal Medicine

## 2017-05-09 NOTE — Progress Notes (Signed)
HPI The patient presents for evaluation of CHB.  She has a past history of nonobstructive coronary disease with a 70% LAD stenosis in 2003. In 2013 she had an echo which was low normal LV function but mild mitral regurgitation. In November of 2013 she had a stress perfusion study with no evidence of ischemia or infarct. She has had some suggestion of diastolic dysfunction on echo. She has had pulmonary function tests which demonstrated normal lung volumes and only a mildly reduced diffusion capacity.  EF on echo in 2015 was mildly reduced at 45 - 50%.  She presented in mid March with fatigue and dyspnea and was found to have heart block.   Because of the previous reduced EF she was given CRT.   In 2018 her ejection fraction was 60-65% with mild mitral regurgitation.   Late last year she had increased SOB but did not improve with diuresis.  She did have a mildly increased BNP recently.  Lasix was increased after this lab came back.     She thinks she does breathe a little bit better.  She is had a little bit less swelling in her feet.  She is not having any new presyncope or syncope.  She is still short of breath with activities.  She is not describing chest pressure, neck or arm discomfort.  She is trying to cut back on her fluids more although she is not specifically counting her fluid intake.    Allergies  Allergen Reactions  . Adhesive [Tape] Itching, Dermatitis, Rash and Other (See Comments)    Blisters and "skin bubbles"  . Ace Inhibitors Cough  . Atorvastatin Other (See Comments)    REACTION: Reaction not known  . Clindamycin Other (See Comments)    Unknown  . Codeine Other (See Comments)    Unknown  . Latex Other (See Comments)    Unknown  . Penicillins Other (See Comments)    Unknown  . Simvastatin Other (See Comments)    REACTION: fatigue    Current Outpatient Medications  Medication Sig Dispense Refill  . albuterol (PROVENTIL HFA;VENTOLIN HFA) 108 (90 Base) MCG/ACT inhaler  Inhale 1-2 puffs into the lungs every 6 (six) hours as needed for wheezing or shortness of breath. 1 Inhaler 0  . aspirin 81 MG tablet Take 81 mg by mouth daily.    Marland Kitchen BIOTIN PO Take 1 tablet by mouth daily.    . bisoprolol (ZEBETA) 10 MG tablet TAKE 1 TABLET BY MOUTH ONCE DAILY 90 tablet 1  . Cholecalciferol (VITAMIN D3 PO) Take 2 capsules by mouth daily.    . furosemide (LASIX) 40 MG tablet Take 1 tablet (40 mg total) by mouth daily. 30 tablet 11  . ipratropium (ATROVENT) 0.03 % nasal spray Place 2 sprays into both nostrils 2 (two) times daily. Do not use for more than 5days. 30 mL 0  . Multiple Vitamins-Minerals (CENTRUM SILVER PO) Take 1 tablet by mouth daily.    . Omega-3 Fatty Acids (FISH OIL PO) Take 1 capsule by mouth daily.    . ranitidine (ZANTAC) 150 MG tablet Take 1 tablet (150 mg total) by mouth 2 (two) times daily. 60 tablet 5  . SYNTHROID 88 MCG tablet Take 1 tablet (88 mcg total) by mouth daily before breakfast. 90 tablet 1  . vitamin C (ASCORBIC ACID) 500 MG tablet Take 500 mg by mouth daily.    . metolazone (ZAROXOLYN) 2.5 MG tablet Take 1 tablet (2.5 mg total) by mouth 3 (three)  times a week. 30 tablet 6   No current facility-administered medications for this visit.     Past Medical History:  Diagnosis Date  . Atrial fibrillation ([place]) 08/16/2016   observed on PPM interrogation, asymptomatic, chad2vasc score is 4.  will consider anticoagulation  . CHF (congestive heart failure) ([place])   . Colon polyps   . Coronary artery disease   . Diverticulosis   . Dyspnea   . GERD (gastroesophageal reflux disease)   . Hearing loss of both ears   . Hepatic cyst   . Hiatal hernia   . Hyperlipidemia   . Hypertension   . Hypothyroidism   . Low back pain   . Mitral valve disorders(424.0)   . Other left bundle branch block   . Palpitations   . URI (upper respiratory infection)     Past Surgical History:  Procedure Laterality Date  . CARDIAC CATHETERIZATION  04-18-01  .  COLONOSCOPY    . HERNIA REPAIR    . PACEMAKER IMPLANT Left 05/15/2016   SJM Assirity MRI dual chamber PPM implanted by Dr Rayann Heman for CHB  . THYROIDECTOMY    . TONSILLECTOMY AND ADENOIDECTOMY    . TOTAL ABDOMINAL HYSTERECTOMY W/ BILATERAL SALPINGOOPHORECTOMY      ROS:   As stated in the HPI and negative for all other systems.  PHYSICAL EXAM BP 126/74   Pulse 71   Ht 5' 2.5" (1.588 m)   Wt 185 lb 3.2 oz (84 kg)   SpO2 97%   BMI 33.33 kg/m   GENERAL:  Well appearing NECK:  No jugular venous distention, waveform within normal limits, carotid upstroke brisk and symmetric, no bruits, no thyromegaly LUNGS:  Clear to auscultation bilaterally CHEST:  Unremarkable HEART:  PMI not displaced or sustained,S1 and S2 within normal limits, no S3, no S4, no clicks, no rubs, no murmurs ABD:  Flat, positive bowel sounds normal in frequency in pitch, no bruits, no rebound, no guarding, no midline pulsatile mass, no hepatomegaly, no splenomegaly EXT:  2 plus pulses throughout, no edema, no cyanosis no clubbing   EKG: NA  ASSESSMENT AND PLAN   ACUTE ON CHRONIC DIASTOLIC HF:     Today I am going to add Zaroxolyn 2.5 mg 3 times per week.  She will get a basic metabolic profile today.  She will get a basic metabolic profile on the 35KT when she comes back for a device check.  I would like her to come back in 1 month to be seen in clinic as well.  We again talked about fluid and salt restriction.   HTN:     The blood pressure is at target. No change in medications is indicated. We will continue with therapeutic lifestyle changes (TLC).  MR:   This was mild.  No change in therapy.   CHB S/P CRT:    She is being seen in clinic next week.   ATRIAL FIB: At the last visit she did not have increased fibrillation burden.  we can reevaluate this at the time of her next device interrogation.  W for now she is not on anticoagulation.

## 2017-05-10 ENCOUNTER — Encounter: Payer: Self-pay | Admitting: Cardiology

## 2017-05-10 ENCOUNTER — Ambulatory Visit: Payer: Medicare Other | Admitting: Cardiology

## 2017-05-10 VITALS — BP 126/74 | HR 71 | Ht 62.5 in | Wt 185.2 lb

## 2017-05-10 DIAGNOSIS — R0602 Shortness of breath: Secondary | ICD-10-CM | POA: Diagnosis not present

## 2017-05-10 DIAGNOSIS — I5033 Acute on chronic diastolic (congestive) heart failure: Secondary | ICD-10-CM | POA: Diagnosis not present

## 2017-05-10 DIAGNOSIS — I10 Essential (primary) hypertension: Secondary | ICD-10-CM | POA: Diagnosis not present

## 2017-05-10 DIAGNOSIS — Z79899 Other long term (current) drug therapy: Secondary | ICD-10-CM

## 2017-05-10 MED ORDER — METOLAZONE 2.5 MG PO TABS: 2.5000 mg | ORAL_TABLET | ORAL | 6 refills | Status: DC

## 2017-05-10 NOTE — Patient Instructions (Signed)
Medication Instructions:  START- Metolazone 2.5 mg three times a week  If you need a refill on your cardiac medications before your next appointment, please call your pharmacy.  Labwork: BMP Today and BMP in 14 days HERE IN OUR OFFICE AT [facility]  Take the provided lab slips for you to take with you to the lab for you blood draw.   You will NOT need to fast   Testing/Procedures: None Ordered  Follow-Up: Your physician wants you to follow-up in: 1 Month with Kerin Ransom.    Thank you for choosing [facility] at [facility]!!

## 2017-05-11 LAB — BASIC METABOLIC PANEL
BUN / CREAT RATIO: 24 (ref 12–28)
BUN: 21 mg/dL (ref 10–36)
CO2: 22 mmol/L (ref 20–29)
CREATININE: 0.86 mg/dL (ref 0.57–1.00)
Calcium: 9.3 mg/dL (ref 8.7–10.3)
Chloride: 106 mmol/L (ref 96–106)
GFR calc Af Amer: 69 mL/min/{1.73_m2} (ref >59)
GFR, EST NON AFRICAN AMERICAN: 60 mL/min/{1.73_m2} (ref >59)
GLUCOSE: 101 mg/dL — AB (ref 65–99)
Potassium: 4.7 mmol/L (ref 3.5–5.2)
SODIUM: 142 mmol/L (ref 134–144)

## 2017-05-13 ENCOUNTER — Other Ambulatory Visit: Payer: Self-pay | Admitting: Internal Medicine

## 2017-05-16 ENCOUNTER — Ambulatory Visit (INDEPENDENT_AMBULATORY_CARE_PROVIDER_SITE_OTHER): Payer: Medicare Other | Admitting: *Deleted

## 2017-05-16 DIAGNOSIS — I442 Atrioventricular block, complete: Secondary | ICD-10-CM

## 2017-05-16 NOTE — Progress Notes (Signed)
Remote pacemaker transmission.   

## 2017-05-17 ENCOUNTER — Encounter: Payer: Self-pay | Admitting: Cardiology

## 2017-06-02 LAB — CUP PACEART REMOTE DEVICE CHECK
Battery Remaining Longevity: 112 mo
Battery Remaining Percentage: 95.5 %
Battery Voltage: 2.99 V
Brady Statistic AP VP Percent: 65 %
Brady Statistic AS VS Percent: 1 %
Brady Statistic RA Percent Paced: 62 %
Implantable Lead Implant Date: 20180313
Implantable Lead Implant Date: 20180313
Implantable Lead Location: 753859
Implantable Pulse Generator Implant Date: 20180313
Lead Channel Impedance Value: 410 Ohm
Lead Channel Impedance Value: 530 Ohm
Lead Channel Pacing Threshold Amplitude: 1.125 V
Lead Channel Pacing Threshold Pulse Width: 0.5 ms
Lead Channel Pacing Threshold Pulse Width: 0.5 ms
Lead Channel Sensing Intrinsic Amplitude: 3.1 mV
Lead Channel Setting Pacing Amplitude: 0.875
Lead Channel Setting Pacing Amplitude: 2.125
MDC IDC LEAD LOCATION: 753860
MDC IDC MSMT LEADCHNL RV PACING THRESHOLD AMPLITUDE: 0.625 V
MDC IDC MSMT LEADCHNL RV SENSING INTR AMPL: 12 mV
MDC IDC PG SERIAL: 8005625
MDC IDC SESS DTM: 20190314060023
MDC IDC SET LEADCHNL RV PACING PULSEWIDTH: 0.5 ms
MDC IDC SET LEADCHNL RV SENSING SENSITIVITY: 4 mV
MDC IDC STAT BRADY AP VS PERCENT: 1 %
MDC IDC STAT BRADY AS VP PERCENT: 30 %
MDC IDC STAT BRADY RV PERCENT PACED: 95 %

## 2017-06-18 ENCOUNTER — Ambulatory Visit: Payer: Medicare Other | Admitting: Cardiology

## 2017-06-18 ENCOUNTER — Encounter: Payer: Self-pay | Admitting: Cardiology

## 2017-06-18 DIAGNOSIS — Z79899 Other long term (current) drug therapy: Secondary | ICD-10-CM | POA: Diagnosis not present

## 2017-06-18 DIAGNOSIS — I5033 Acute on chronic diastolic (congestive) heart failure: Secondary | ICD-10-CM | POA: Diagnosis not present

## 2017-06-18 DIAGNOSIS — I442 Atrioventricular block, complete: Secondary | ICD-10-CM | POA: Diagnosis not present

## 2017-06-18 DIAGNOSIS — I2510 Atherosclerotic heart disease of native coronary artery without angina pectoris: Secondary | ICD-10-CM | POA: Diagnosis not present

## 2017-06-18 DIAGNOSIS — I10 Essential (primary) hypertension: Secondary | ICD-10-CM | POA: Diagnosis not present

## 2017-06-18 NOTE — Assessment & Plan Note (Signed)
70% LAD at cath 2003. Myoview low risk 2013.

## 2017-06-18 NOTE — Assessment & Plan Note (Signed)
Echo April 2018- mild MR. EF 60-65%  Pro BNP elevated

## 2017-06-18 NOTE — Assessment & Plan Note (Signed)
s/p PPM 05/15/16

## 2017-06-18 NOTE — Assessment & Plan Note (Signed)
Changed from lopressor 100 mg daily to bisoprolol 5 mg daily for suspected asthma  B/P controlled

## 2017-06-18 NOTE — Patient Instructions (Addendum)
Medication Instructions:  TAKE BOTH Zaroxolyn (Metolazone) 2.5mg  Three times a week (Monday, Wednesday, Friday) TAKE 30 minutes BEFORE taking Lasix TAKE LASIX EVERYDAY  Labwork: None   Testing/Procedures: None   Follow-Up: FOLLOW UP WITH LUKE IN 10 DAYS   Any Other Special Instructions Will Be Listed Below (If Applicable).  If you need a refill on your cardiac medications before your next appointment, please call your pharmacy.

## 2017-06-18 NOTE — Progress Notes (Signed)
06/18/2017 Beverly Kim   19-Apr-1924  [id number]  Primary Physician [name], MD Primary Cardiologist: Dr Percival Spanish  HPI:  Delightful 84 y/o (looks younger!) female from [place] with a long history of dyspnea. She has been followed by cardiology and pulmonary. Remote cath showed non critical CADS with a 70% LAD.  A Myoview in 2013 was low risk. She had a pacemaker implanted for CHB in the past. Dr Percival Spanish notes she has had PAF but it has not been sustained or frequent so she is not anticoagulated. She has noticed DOE and edema. Her proBNP on 04/08/17 was 1634. Her Lasix was increased. Dr Percival Spanish recently saw her in the office 05/10/17 and added Zaroxolyn 2.5 mg. The pt is seeing me in follow up. Unfortunately she misunderstood the instructions and added the Zaroxolyn but she stopped her lasix. She feels "OK" some good days some bad days. Occasional abdominal bloating and dyspnea. Occasional LE edema.    Current Outpatient Medications  Medication Sig Dispense Refill  . albuterol (PROVENTIL HFA;VENTOLIN HFA) 108 (90 Base) MCG/ACT inhaler Inhale 1-2 puffs into the lungs every 6 (six) hours as needed for wheezing or shortness of breath. 1 Inhaler 0  . aspirin 81 MG tablet Take 81 mg by mouth daily.    Marland Kitchen BIOTIN PO Take 1 tablet by mouth daily.    . bisoprolol (ZEBETA) 10 MG tablet TAKE 1 TABLET BY MOUTH ONCE DAILY 90 tablet 1  . Cholecalciferol (VITAMIN D3 PO) Take 2 capsules by mouth daily.    Marland Kitchen ipratropium (ATROVENT) 0.03 % nasal spray Place 2 sprays into both nostrils 2 (two) times daily. Do not use for more than 5days. 30 mL 0  . metolazone (ZAROXOLYN) 2.5 MG tablet Take 1 tablet (2.5 mg total) by mouth 3 (three) times a week. 30 tablet 6  . Multiple Vitamins-Minerals (CENTRUM SILVER PO) Take 1 tablet by mouth daily.    . Omega-3 Fatty Acids (FISH OIL PO) Take 1 capsule by mouth daily.    . ranitidine (ZANTAC) 150 MG tablet Take 1 tablet (150 mg total) by mouth 2 (two) times daily. 60  tablet 5  . SYNTHROID 88 MCG tablet TAKE 1 TABLET BY MOUTH DAILY BEFORE BREAKFAST 90 tablet 1  . vitamin C (ASCORBIC ACID) 500 MG tablet Take 500 mg by mouth daily.    . furosemide (LASIX) 40 MG tablet Take 1 tablet (40 mg total) by mouth daily. (Patient not taking: Reported on 06/18/2017) 30 tablet 11   No current facility-administered medications for this visit.     Allergies  Allergen Reactions  . Adhesive [Tape] Itching, Dermatitis, Rash and Other (See Comments)    Blisters and "skin bubbles"  . Ace Inhibitors Cough  . Atorvastatin Other (See Comments)    REACTION: Reaction not known  . Clindamycin Other (See Comments)    Unknown  . Codeine Other (See Comments)    Unknown  . Latex Other (See Comments)    Unknown  . Penicillins Other (See Comments)    Unknown  . Simvastatin Other (See Comments)    REACTION: fatigue    Past Medical History:  Diagnosis Date  . Atrial fibrillation ([place]) 08/16/2016   observed on PPM interrogation, asymptomatic, chad2vasc score is 4.  will consider anticoagulation  . CHF (congestive heart failure) ([place])   . Colon polyps   . Coronary artery disease   . Diverticulosis   . Dyspnea   . GERD (gastroesophageal reflux disease)   . Hearing loss  of both ears   . Hepatic cyst   . Hiatal hernia   . Hyperlipidemia   . Hypertension   . Hypothyroidism   . Low back pain   . Mitral valve disorders(424.0)   . Other left bundle branch block   . Palpitations   . URI (upper respiratory infection)     Social History   Socioeconomic History  . Marital status: Widowed    Spouse name: Not on file  . Number of children: 4  . Years of education: Not on file  . Highest education level: Not on file  Occupational History  . Occupation: retired  Scientific laboratory technician  . Financial resource strain: Not on file  . Food insecurity:    Worry: Not on file    Inability: Not on file  . Transportation needs:    Medical: Not on file    Non-medical: Not on file    Tobacco Use  . Smoking status: Never Smoker  . Smokeless tobacco: Never Used  Substance and Sexual Activity  . Alcohol use: No  . Drug use: No  . Sexual activity: Not on file  Lifestyle  . Physical activity:    Days per week: Not on file    Minutes per session: Not on file  . Stress: Not on file  Relationships  . Social connections:    Talks on phone: Not on file    Gets together: Not on file    Attends religious service: Not on file    Active member of club or organization: Not on file    Attends meetings of clubs or organizations: Not on file    Relationship status: Not on file  . Intimate partner violence:    Fear of current or ex partner: Not on file    Emotionally abused: Not on file    Physically abused: Not on file    Forced sexual activity: Not on file  Other Topics Concern  . Not on file  Social History Narrative  . Not on file     Family History  Problem Relation Age of Onset  . Coronary artery disease Unknown        family hx of 1st degree relative <50  . Diabetes Unknown        family hx of  . Other Unknown        cardiovascular disorder family hx of  . Other Unknown        neurological disorder family hx of  . Other Unknown        respiratory disease family hx of  . Heart attack Daughter   . Heart Problems Unknown        all children  . Sudden death Son   . Heart disease Brother   . Heart disease Sister   . Colon cancer Neg Hx   . Colon polyps Neg Hx      Review of Systems: General: negative for chills, fever, night sweats or weight changes.  Cardiovascular: negative for chest pain, dyspnea on exertion, edema, orthopnea, palpitations, paroxysmal nocturnal dyspnea or shortness of breath Dermatological: negative for rash Respiratory: negative for cough or wheezing Urologic: negative for hematuria Abdominal: negative for nausea, vomiting, diarrhea, bright red blood per rectum, melena, or hematemesis Neurologic: negative for visual changes,  syncope, or dizziness All other systems reviewed and are otherwise negative except as noted above.    Blood pressure 122/62, pulse 74, height 5' 2.5" (1.588 m), weight 181 lb 12.8 oz (82.5 kg), SpO2  96 %.  General appearance: alert, cooperative, no distress and looks younger than her stated age Neck: no carotid bruit and no JVD Lungs: clear to auscultation bilaterally Extremities: trace edema Skin: Skin color, texture, turgor normal. No rashes or lesions Neurologic: Grossly normal   ASSESSMENT AND PLAN:   Acute on chronic diastolic heart failure ([place]) Echo April 2018- mild MR. EF 60-65%  Pro BNP elevated  CAD (coronary artery disease) 70% LAD at cath 2003. Myoview low risk 2013.  Complete heart block ([facility]) s/p PPM 05/15/16  Essential hypertension Changed from lopressor 100 mg daily to bisoprolol 5 mg daily for suspected asthma  B/P controlled   PLAN  I suggested we try both the Zaroxolyn and Lasix as Dr Percival Spanish intended. I'll see her back in 7-10 days for follow up- labs at that time.   Kerin Ransom PA-C 06/18/2017 2:00 PM

## 2017-06-19 LAB — BASIC METABOLIC PANEL
BUN/Creatinine Ratio: 24 (ref 12–28)
BUN: 24 mg/dL (ref 10–36)
CALCIUM: 10 mg/dL (ref 8.7–10.3)
CHLORIDE: 96 mmol/L (ref 96–106)
CO2: 25 mmol/L (ref 20–29)
CREATININE: 0.99 mg/dL (ref 0.57–1.00)
GFR calc Af Amer: 58 mL/min/{1.73_m2} — ABNORMAL LOW (ref >59)
GFR calc non Af Amer: 50 mL/min/{1.73_m2} — ABNORMAL LOW (ref >59)
Glucose: 125 mg/dL — ABNORMAL HIGH (ref 65–99)
Potassium: 4.5 mmol/L (ref 3.5–5.2)
Sodium: 138 mmol/L (ref 134–144)

## 2017-06-26 ENCOUNTER — Ambulatory Visit: Payer: Medicare Other | Admitting: Cardiology

## 2017-06-26 ENCOUNTER — Encounter: Payer: Self-pay | Admitting: Cardiology

## 2017-06-26 VITALS — BP 156/74 | HR 87 | Ht 62.5 in | Wt 182.0 lb

## 2017-06-26 DIAGNOSIS — I2510 Atherosclerotic heart disease of native coronary artery without angina pectoris: Secondary | ICD-10-CM

## 2017-06-26 DIAGNOSIS — I10 Essential (primary) hypertension: Secondary | ICD-10-CM

## 2017-06-26 DIAGNOSIS — R0609 Other forms of dyspnea: Secondary | ICD-10-CM

## 2017-06-26 DIAGNOSIS — I5033 Acute on chronic diastolic (congestive) heart failure: Secondary | ICD-10-CM

## 2017-06-26 DIAGNOSIS — I442 Atrioventricular block, complete: Secondary | ICD-10-CM | POA: Diagnosis not present

## 2017-06-26 DIAGNOSIS — R0600 Dyspnea, unspecified: Secondary | ICD-10-CM

## 2017-06-26 NOTE — Progress Notes (Signed)
06/26/2017 Beverly Kim   1924-06-01  [id number]  Primary Physician [name], MD Primary Cardiologist: Dr Percival Spanish  HPI:  84 y/o (looks younger!) female I'm seeing today as a one week follow up for comppliants of dyspnea. She has been followed by cardiology and pulmonary. Remote cath showed non critical CADS with a 70% LAD.  A Myoview in 2013 was low risk. She had a pacemaker implanted for CHB in the past. Dr Percival Spanish notes she has had PAF but it has not been sustained or frequent so she is not anticoagulated. She had noticed DOE and edema in her ankles. Her proBNP on 04/08/17 was 1634. Her Lasix was increased. Dr Percival Spanish saw her in the office 05/10/17 and added Zaroxolyn 2.5 mg. The pt saw me in follow up 06/18/17. Unfortunately she misunderstood the instructions from dr Percival Spanish and added the [facility] but she stopped her lasix. I suggested we try both the Zaroxolyn and Lasix and I'm seeing her back today in follow up.   She has been taking the Zaroxolyn and Lasix as prescribed. She weighs herself at home and today weighed 176 lbs. She still has DOE. She has no edema. She complains she carries her excess weight in her abdomin. We talked about a low salt diet and she admits she has been eating ham. She denies orthopnea and has no edema today.    Current Outpatient Medications  Medication Sig Dispense Refill  . albuterol (PROVENTIL HFA;VENTOLIN HFA) 108 (90 Base) MCG/ACT inhaler Inhale 1-2 puffs into the lungs every 6 (six) hours as needed for wheezing or shortness of breath. 1 Inhaler 0  . aspirin 81 MG tablet Take 81 mg by mouth daily.    Marland Kitchen BIOTIN PO Take 1 tablet by mouth daily.    . bisoprolol (ZEBETA) 10 MG tablet TAKE 1 TABLET BY MOUTH ONCE DAILY 90 tablet 1  . Cholecalciferol (VITAMIN D3 PO) Take 2 capsules by mouth daily.    . furosemide (LASIX) 40 MG tablet Take 1 tablet (40 mg total) by mouth daily. 30 tablet 11  . ipratropium (ATROVENT) 0.03 % nasal spray Place 2 sprays into  both nostrils 2 (two) times daily. Do not use for more than 5days. 30 mL 0  . metolazone (ZAROXOLYN) 2.5 MG tablet Take 1 tablet (2.5 mg total) by mouth 3 (three) times a week. 30 tablet 6  . Multiple Vitamins-Minerals (CENTRUM SILVER PO) Take 1 tablet by mouth daily.    . Omega-3 Fatty Acids (FISH OIL PO) Take 1 capsule by mouth daily.    . ranitidine (ZANTAC) 150 MG tablet Take 1 tablet (150 mg total) by mouth 2 (two) times daily. 60 tablet 5  . SYNTHROID 88 MCG tablet TAKE 1 TABLET BY MOUTH DAILY BEFORE BREAKFAST 90 tablet 1  . vitamin C (ASCORBIC ACID) 500 MG tablet Take 500 mg by mouth daily.     No current facility-administered medications for this visit.     Allergies  Allergen Reactions  . Adhesive [Tape] Itching, Dermatitis, Rash and Other (See Comments)    Blisters and "skin bubbles"  . Ace Inhibitors Cough  . Atorvastatin Other (See Comments)    REACTION: Reaction not known  . Clindamycin Other (See Comments)    Unknown  . Codeine Other (See Comments)    Unknown  . Latex Other (See Comments)    Unknown  . Penicillins Other (See Comments)    Unknown  . Simvastatin Other (See Comments)    REACTION: fatigue  Past Medical History:  Diagnosis Date  . Atrial fibrillation ([place]) 08/16/2016   observed on PPM interrogation, asymptomatic, chad2vasc score is 4.  will consider anticoagulation  . CHF (congestive heart failure) ([place])   . Colon polyps   . Coronary artery disease   . Diverticulosis   . Dyspnea   . GERD (gastroesophageal reflux disease)   . Hearing loss of both ears   . Hepatic cyst   . Hiatal hernia   . Hyperlipidemia   . Hypertension   . Hypothyroidism   . Low back pain   . Mitral valve disorders(424.0)   . Other left bundle branch block   . Palpitations   . URI (upper respiratory infection)     Social History   Socioeconomic History  . Marital status: Widowed    Spouse name: Not on file  . Number of children: 4  . Years of education: Not on  file  . Highest education level: Not on file  Occupational History  . Occupation: retired  Scientific laboratory technician  . Financial resource strain: Not on file  . Food insecurity:    Worry: Not on file    Inability: Not on file  . Transportation needs:    Medical: Not on file    Non-medical: Not on file  Tobacco Use  . Smoking status: Never Smoker  . Smokeless tobacco: Never Used  Substance and Sexual Activity  . Alcohol use: No  . Drug use: No  . Sexual activity: Not on file  Lifestyle  . Physical activity:    Days per week: Not on file    Minutes per session: Not on file  . Stress: Not on file  Relationships  . Social connections:    Talks on phone: Not on file    Gets together: Not on file    Attends religious service: Not on file    Active member of club or organization: Not on file    Attends meetings of clubs or organizations: Not on file    Relationship status: Not on file  . Intimate partner violence:    Fear of current or ex partner: Not on file    Emotionally abused: Not on file    Physically abused: Not on file    Forced sexual activity: Not on file  Other Topics Concern  . Not on file  Social History Narrative  . Not on file     Family History  Problem Relation Age of Onset  . Coronary artery disease Unknown        family hx of 1st degree relative <50  . Diabetes Unknown        family hx of  . Other Unknown        cardiovascular disorder family hx of  . Other Unknown        neurological disorder family hx of  . Other Unknown        respiratory disease family hx of  . Heart attack Daughter   . Heart Problems Unknown        all children  . Sudden death Son   . Heart disease Brother   . Heart disease Sister   . Colon cancer Neg Hx   . Colon polyps Neg Hx      Review of Systems: General: negative for chills, fever, night sweats or weight changes.  Cardiovascular: negative for chest pain, edema, orthopnea, palpitations, paroxysmal nocturnal dyspnea or  shortness of breath Dermatological: negative for rash Respiratory: negative for cough  or wheezing Urologic: negative for hematuria Abdominal: negative for nausea, vomiting, diarrhea, bright red blood per rectum, melena, or hematemesis Neurologic: negative for visual changes, syncope, or dizziness All other systems reviewed and are otherwise negative except as noted above.    Blood pressure (!) 156/74, pulse 87, height 5' 2.5" (1.588 m), weight 182 lb (82.6 kg), SpO2 96 %.  General appearance: alert, cooperative, no distress, mildly obese and looks younger than 84 y/o Neck: no carotid bruit and no JVD Lungs: clear to auscultation bilaterally Heart: regular rate and rhythm Extremities: extremities normal, atraumatic, no cyanosis or edema Skin: Skin color, texture, turgor normal. No rashes or lesions Neurologic: Grossly normal   ASSESSMENT AND PLAN:   Acute on chronic diastolic heart failure ([place]) Echo April 2018- mild MR. EF 60-65%  Pro BNP was elevated-1634 in Feb 2019  CAD (coronary artery disease) 70% LAD at cath 2003. Myoview low risk 2013.  Complete heart block ([facility]) s/p PPM 05/15/16  Essential hypertension Changed from lopressor 100 mg daily to bisoprolol 5 mg daily for suspected asthma  B/P controlled   PLAN  I suggested she continue her current medications unless her home weight drops below 172 lbs, then she should stop the Metolazone. I did order a BMP and BNP today and will make further adjustments if these labs indicate that is neccessary.   Kerin Ransom PA-C 06/26/2017 1:05 PM

## 2017-06-26 NOTE — Patient Instructions (Signed)
Medication Instructions: Your physician recommends that you continue on your current medications as directed. Please refer to the Current Medication list given to you today.  Labwork: Your physician recommends that you return for lab work today: BMET, BNP   Follow-Up: Your physician recommends that you schedule a follow-up appointment in: 3 months with Dr. Percival Spanish.   Any Other Special Instructions are listed below:  If your weight drops below 172 on your home scale, discontinue Metolazone.   If you need a refill on your cardiac medications before your next appointment, please call your pharmacy.

## 2017-06-27 LAB — BASIC METABOLIC PANEL
BUN/Creatinine Ratio: 20 (ref 12–28)
BUN: 18 mg/dL (ref 10–36)
CO2: 27 mmol/L (ref 20–29)
Calcium: 10.1 mg/dL (ref 8.7–10.3)
Chloride: 97 mmol/L (ref 96–106)
Creatinine, Ser: 0.88 mg/dL (ref 0.57–1.00)
GFR calc Af Amer: 67 mL/min/{1.73_m2} (ref >59)
GFR calc non Af Amer: 58 mL/min/{1.73_m2} — ABNORMAL LOW (ref >59)
Glucose: 110 mg/dL — ABNORMAL HIGH (ref 65–99)
Potassium: 4.4 mmol/L (ref 3.5–5.2)
Sodium: 142 mmol/L (ref 134–144)

## 2017-06-27 LAB — BRAIN NATRIURETIC PEPTIDE: BNP: 176.4 pg/mL — ABNORMAL HIGH (ref 0.0–100.0)

## 2017-07-01 ENCOUNTER — Telehealth: Payer: Self-pay | Admitting: Internal Medicine

## 2017-07-01 NOTE — Telephone Encounter (Signed)
There is no need for antibiotics prior to dental work.

## 2017-07-01 NOTE — Telephone Encounter (Signed)
Copied from [place] [phone]. Topic: General - Other >> Jul 01, 2017  1:02 PM Carolyn Stare wrote:  Pt call to ask if she still need to get an antibiotic when going to the dentist and id so what anticbiotic would she need. She has an appt on 07/04/17 You She changed dentist offices Dr Jarrett Soho [address]

## 2017-07-02 NOTE — Telephone Encounter (Signed)
Spoke with pt to inform.  

## 2017-07-18 NOTE — Progress Notes (Addendum)
Subjective:   Beverly Kim is a 84 y.o. female who presents for Medicare Annual (Subsequent) preventive examination.  Review of Systems:  No ROS.  Medicare Wellness Visit. Additional risk factors are reflected in the social history.  Cardiac Risk Factors include: advanced age (>52men, >45 women);dyslipidemia;hypertension Sleep patterns: feels rested on waking, gets up 1 times nightly to void and sleeps 7 hours nightly.    Home Safety/Smoke Alarms: Feels safe in home. Smoke alarms in place.  Living environment; residence and Firearm Safety: 1-story house/ trailer, equipment: Radio producer, Type: [place], no firearms.Lives alone, denies needs for DME, good support system Seat Belt Safety/Bike Helmet: Wears seat belt.      Objective:     Vitals: BP (!) 142/68   Pulse 63   Resp 18   Ht 5\' 3"  (1.6 m)   Wt 182 lb (82.6 kg)   SpO2 98%   BMI 32.24 kg/m   Body mass index is 32.24 kg/m.  Advanced Directives 07/19/2017 03/05/2017 01/15/2017 05/14/2016  Does Patient Have a Medical Advance Directive? No No No No  Does patient want to make changes to medical advance directive? No - Patient declined - - -  Would patient like information on creating a medical advance directive? - - No - Patient declined No - Patient declined    Tobacco Social History   Tobacco Use  Smoking Status Never Smoker  Smokeless Tobacco Never Used     Counseling given: Not Answered   Past Medical History:  Diagnosis Date  . Atrial fibrillation ([place]) 08/16/2016   observed on PPM interrogation, asymptomatic, chad2vasc score is 4.  will consider anticoagulation  . CHF (congestive heart failure) ([place])   . Colon polyps   . Coronary artery disease   . Diverticulosis   . Dyspnea   . GERD (gastroesophageal reflux disease)   . Hearing loss of both ears   . Hepatic cyst   . Hiatal hernia   . Hyperlipidemia   . Hypertension   . Hypothyroidism   . Low back pain   . Mitral valve disorders(424.0)   . Other  left bundle branch block   . Palpitations   . URI (upper respiratory infection)    Past Surgical History:  Procedure Laterality Date  . CARDIAC CATHETERIZATION  04-18-01  . COLONOSCOPY    . HERNIA REPAIR    . PACEMAKER IMPLANT Left 05/15/2016   SJM Assirity MRI dual chamber PPM implanted by Dr Rayann Heman for CHB  . THYROIDECTOMY    . TONSILLECTOMY AND ADENOIDECTOMY    . TOTAL ABDOMINAL HYSTERECTOMY W/ BILATERAL SALPINGOOPHORECTOMY     Family History  Problem Relation Age of Onset  . Coronary artery disease Unknown        family hx of 1st degree relative <50  . Diabetes Unknown        family hx of  . Other Unknown        cardiovascular disorder family hx of  . Other Unknown        neurological disorder family hx of  . Other Unknown        respiratory disease family hx of  . Heart attack Daughter   . Heart Problems Unknown        all children  . Sudden death Son   . Heart disease Brother   . Heart disease Sister   . Colon cancer Neg Hx   . Colon polyps Neg Hx    Social History   Socioeconomic History  .  Marital status: Widowed    Spouse name: Not on file  . Number of children: 4  . Years of education: Not on file  . Highest education level: Not on file  Occupational History  . Occupation: retired  Scientific laboratory technician  . Financial resource strain: Not hard at all  . Food insecurity:    Worry: Never true    Inability: Never true  . Transportation needs:    Medical: No    Non-medical: No  Tobacco Use  . Smoking status: Never Smoker  . Smokeless tobacco: Never Used  Substance and Sexual Activity  . Alcohol use: No  . Drug use: No  . Sexual activity: Never  Lifestyle  . Physical activity:    Days per week: 0 days    Minutes per session: 0 min  . Stress: Not at all  Relationships  . Social connections:    Talks on phone: More than three times a week    Gets together: More than three times a week    Attends religious service: More than 4 times per year    Active  member of club or organization: Yes    Attends meetings of clubs or organizations: More than 4 times per year    Relationship status: Widowed  Other Topics Concern  . Not on file  Social History Narrative  . Not on file    Outpatient Encounter Medications as of 07/19/2017  Medication Sig  . albuterol (PROVENTIL HFA;VENTOLIN HFA) 108 (90 Base) MCG/ACT inhaler Inhale 1-2 puffs into the lungs every 6 (six) hours as needed for wheezing or shortness of breath.  Marland Kitchen aspirin 81 MG tablet Take 81 mg by mouth daily.  Marland Kitchen BIOTIN PO Take 1 tablet by mouth daily.  . bisoprolol (ZEBETA) 10 MG tablet TAKE 1 TABLET BY MOUTH ONCE DAILY  . Cholecalciferol (VITAMIN D3 PO) Take 2 capsules by mouth daily.  . furosemide (LASIX) 40 MG tablet Take 1 tablet (40 mg total) by mouth daily.  . metolazone (ZAROXOLYN) 2.5 MG tablet Take 1 tablet (2.5 mg total) by mouth 3 (three) times a week.  . Multiple Vitamins-Minerals (CENTRUM SILVER PO) Take 1 tablet by mouth daily.  . Omega-3 Fatty Acids (FISH OIL PO) Take 1 capsule by mouth daily.  . ranitidine (ZANTAC) 150 MG tablet Take 1 tablet (150 mg total) by mouth 2 (two) times daily.  Marland Kitchen SYNTHROID 88 MCG tablet TAKE 1 TABLET BY MOUTH DAILY BEFORE BREAKFAST  . vitamin C (ASCORBIC ACID) 500 MG tablet Take 500 mg by mouth daily.  . [DISCONTINUED] ipratropium (ATROVENT) 0.03 % nasal spray Place 2 sprays into both nostrils 2 (two) times daily. Do not use for more than 5days. (Patient not taking: Reported on 07/19/2017)   No facility-administered encounter medications on file as of 07/19/2017.     Activities of Daily Living In your present state of health, do you have any difficulty performing the following activities: 07/19/2017 01/15/2017  Hearing? [name]  Vision? N N  Difficulty concentrating or making decisions? N N  Walking or climbing stairs? Y N  Dressing or bathing? N N  Doing errands, shopping? N N  Preparing Food and eating ? N N  Using the Toilet? N N  In the past  six months, have you accidently leaked urine? N N  Do you have problems with loss of bowel control? N N  Managing your Medications? N N  Managing your Finances? N N  Housekeeping or managing your Housekeeping? Aggie Moats  Some recent data might be hidden    Patient Care Team: [name], MD as PCP - General (Internal Medicine) Minus Breeding, MD as Consulting Physician (Cardiology) Tanda Rockers, MD as Consulting Physician (Pulmonary Disease)    Assessment:   This is a routine wellness examination for Beverly Kim. Physical assessment deferred to PCP.   Exercise Activities and Dietary recommendations Current Exercise Habits: Home exercise routine(chair exercise pamphlets provided), Type of exercise: calisthenics;stretching(exercise in bed), Time (Minutes): 30, Frequency (Times/Week): 5, Weekly Exercise (Minutes/Week): 150, Intensity: Mild, Exercise limited by: cardiac condition(s);orthopedic condition(s)  Diet (meal preparation, eat out, water intake, caffeinated beverages, dairy products, fruits and vegetables): in general, a "healthy" diet  , well balanced  Reviewed heart healthy diet, encouraged patient to increase daily water intake.  Goals    . Patient Stated     Stay as healthy and as independent possible. Worship God, enjoy life and family.       Fall Risk Fall Risk  07/19/2017 01/15/2017 03/16/2016 01/03/2015 01/03/2015  Falls in the past year? No No No Yes No  Number falls in past yr: - - - 1 -  Injury with Fall? - - - No -  Risk for fall due to : Impaired balance/gait;Impaired mobility - - - -   Depression Screen PHQ 2/9 Scores 07/19/2017 01/15/2017 03/16/2016 01/03/2015  PHQ - 2 Score 0 0 0 0  PHQ- 9 Score 3 - - -     Cognitive Function MMSE - Mini Mental State Exam 07/19/2017 01/15/2017  Orientation to time 5 5  Orientation to Place 5 5  Registration 3 3  Attention/ Calculation 4 4  Recall 1 1  Language- name 2 objects 2 2  Language- repeat 1 1  Language- follow  3 step command 3 3  Language- read & follow direction 1 1  Write a sentence 1 1  Copy design 1 1  Total score 27 27        Immunization History  Administered Date(s) Administered  . Influenza Split 12/25/2010, 12/25/2011  . Influenza Whole 03/05/2005, 12/13/2008  . Influenza, High Dose Seasonal PF 12/16/2012, 01/15/2017  . Pneumococcal Polysaccharide-23 03/05/2002, 08/12/2007  . Td 03/06/2003  . Zoster 08/12/2007   Screening Tests Health Maintenance  Topic Date Due  . PNA vac Low Risk Adult (2 of 2 - PCV13) 08/11/2008  . TETANUS/TDAP  03/05/2013  . DEXA SCAN  01/17/2018 (Originally 09/28/1991)  . INFLUENZA VACCINE  10/03/2017       Plan:    Continue doing brain stimulating activities (puzzles, reading, adult coloring books, staying active) to keep memory sharp.   Continue to eat heart healthy diet (full of fruits, vegetables, whole grains, lean protein, water--limit salt, fat, and sugar intake) and increase physical activity as tolerated.   I have personally reviewed and noted the following in the patient's chart:   . Medical and social history . Use of alcohol, tobacco or illicit drugs  . Current medications and supplements . Functional ability and status . Nutritional status . Physical activity . Advanced directives . List of other physicians . Vitals . Screenings to include cognitive, depression, and falls . Referrals and appointments  In addition, I have reviewed and discussed with patient certain preventive protocols, quality metrics, and best practice recommendations. A written personalized care plan for preventive services as well as general preventive health recommendations were provided to patient.     Michiel Cowboy, RN  07/19/2017   Medical screening examination/treatment/procedure(s) were performed by non-physician  practitioner and as supervising physician I was immediately available for consultation/collaboration. I agree with above. [name],  MD

## 2017-07-19 ENCOUNTER — Ambulatory Visit: Payer: Medicare Other | Admitting: Internal Medicine

## 2017-07-19 ENCOUNTER — Ambulatory Visit (INDEPENDENT_AMBULATORY_CARE_PROVIDER_SITE_OTHER): Payer: Medicare Other | Admitting: *Deleted

## 2017-07-19 VITALS — BP 142/68 | HR 63 | Resp 18 | Ht 63.0 in | Wt 182.0 lb

## 2017-07-19 DIAGNOSIS — Z0000 Encounter for general adult medical examination without abnormal findings: Secondary | ICD-10-CM | POA: Diagnosis not present

## 2017-07-19 NOTE — Patient Instructions (Addendum)
Continue doing brain stimulating activities (puzzles, reading, adult coloring books, staying active) to keep memory sharp.   Continue to eat heart healthy diet (full of fruits, vegetables, whole grains, lean protein, water--limit salt, fat, and sugar intake) and increase physical activity as tolerated.  Beverly Kim , Thank you for taking time to come for your Medicare Wellness Visit. I appreciate your ongoing commitment to your health goals. Please review the following plan we discussed and let me know if I can assist you in the future.   These are the goals we discussed: Goals    . Patient Stated     Stay as healthy and as independent possible. Worship God, enjoy life and family.       This is a list of the screening recommended for you and due dates:  Health Maintenance  Topic Date Due  . Pneumonia vaccines (2 of 2 - PCV13) 08/11/2008  . Tetanus Vaccine  03/05/2013  . DEXA scan (bone density measurement)  01/17/2018*  . Flu Shot  10/03/2017  *Topic was postponed. The date shown is not the original due date.     It is important to avoid accidents which may result in broken bones.  Here are a few ideas on how to make your home safer so you will be less likely to trip or fall.  1. Use nonskid mats or non slip strips in your shower or tub, on your bathroom floor and around sinks.  If you know that you have spilled water, wipe it up! 2. In the bathroom, it is important to have properly installed grab bars on the walls or on the edge of the tub.  Towel racks are NOT strong enough for you to hold onto or to pull on for support. 3. Stairs and hallways should have enough light.  Add lamps or night lights if you need ore light. 4. It is good to have handrails on both sides of the stairs if possible.  Always fix broken handrails right away. 5. It is important to see the edges of steps.  Paint the edges of outdoor steps white so you can see them better.  Put colored tape on the edge of inside  steps. 6. Throw-rugs are dangerous because they can slide.  Removing the rugs is the best idea, but if they must stay, add adhesive carpet tape to prevent slipping. 7. Do not keep things on stairs or in the halls.  Remove small furniture that blocks the halls as it may cause you to trip.  Keep telephone and electrical cords out of the way where you walk. 8. Always were sturdy, rubber-soled shoes for good support.  Never wear just socks, especially on the stairs.  Socks may cause you to slip or fall.  Do not wear full-length housecoats as you can easily trip on the bottom.  9. Place the things you use the most on the shelves that are the easiest to reach.  If you use a stepstool, make sure it is in good condition.  If you feel unsteady, DO NOT climb, ask for help. 10. If a health professional advises you to use a cane or walker, do not be ashamed.  These items can keep you from falling and breaking your bones.  Insomnia Insomnia is a sleep disorder that makes it difficult to fall asleep or to stay asleep. Insomnia can cause tiredness (fatigue), low energy, difficulty concentrating, mood swings, and poor performance at work or school. There are three different ways to  classify insomnia:  Difficulty falling asleep.  Difficulty staying asleep.  Waking up too early in the morning.  Any type of insomnia can be long-term (chronic) or short-term (acute). Both are common. Short-term insomnia usually lasts for three months or less. Chronic insomnia occurs at least three times a week for longer than three months. What are the causes? Insomnia may be caused by another condition, situation, or substance, such as:  Anxiety.  Certain medicines.  Gastroesophageal reflux disease (GERD) or other gastrointestinal conditions.  Asthma or other breathing conditions.  Restless legs syndrome, sleep apnea, or other sleep disorders.  Chronic pain.  Menopause. This may include hot flashes.  Stroke.  Abuse  of alcohol, tobacco, or illegal drugs.  Depression.  Caffeine.  Neurological disorders, such as Alzheimer disease.  An overactive thyroid (hyperthyroidism).  The cause of insomnia may not be known. What increases the risk? Risk factors for insomnia include:  Gender. Women are more commonly affected than men.  Age. Insomnia is more common as you get older.  Stress. This may involve your professional or personal life.  Income. Insomnia is more common in people with lower income.  Lack of exercise.  Irregular work schedule or night shifts.  Traveling between different time zones.  What are the signs or symptoms? If you have insomnia, trouble falling asleep or trouble staying asleep is the main symptom. This may lead to other symptoms, such as:  Feeling fatigued.  Feeling nervous about going to sleep.  Not feeling rested in the morning.  Having trouble concentrating.  Feeling irritable, anxious, or depressed.  How is this treated? Treatment for insomnia depends on the cause. If your insomnia is caused by an underlying condition, treatment will focus on addressing the condition. Treatment may also include:  Medicines to help you sleep.  Counseling or therapy.  Lifestyle adjustments.  Follow these instructions at home:  Take medicines only as directed by your health care provider.  Keep regular sleeping and waking hours. Avoid naps.  Keep a sleep diary to help you and your health care provider figure out what could be causing your insomnia. Include: ? When you sleep. ? When you wake up during the night. ? How well you sleep. ? How rested you feel the next day. ? Any side effects of medicines you are taking. ? What you eat and drink.  Make your bedroom a comfortable place where it is easy to fall asleep: ? Put up shades or special blackout curtains to block light from outside. ? Use a white noise machine to block noise. ? Keep the temperature  cool.  Exercise regularly as directed by your health care provider. Avoid exercising right before bedtime.  Use relaxation techniques to manage stress. Ask your health care provider to suggest some techniques that may work well for you. These may include: ? Breathing exercises. ? Routines to release muscle tension. ? Visualizing peaceful scenes.  Cut back on alcohol, caffeinated beverages, and cigarettes, especially close to bedtime. These can disrupt your sleep.  Do not overeat or eat spicy foods right before bedtime. This can lead to digestive discomfort that can make it hard for you to sleep.  Limit screen use before bedtime. This includes: ? Watching TV. ? Using your smartphone, tablet, and computer.  Stick to a routine. This can help you fall asleep faster. Try to do a quiet activity, brush your teeth, and go to bed at the same time each night.  Get out of bed if you are  still awake after 15 minutes of trying to sleep. Keep the lights down, but try reading or doing a quiet activity. When you feel sleepy, go back to bed.  Make sure that you drive carefully. Avoid driving if you feel very sleepy.  Keep all follow-up appointments as directed by your health care provider. This is important. Contact a health care provider if:  You are tired throughout the day or have trouble in your daily routine due to sleepiness.  You continue to have sleep problems or your sleep problems get worse. Get help right away if:  You have serious thoughts about hurting yourself or someone else. This information is not intended to replace advice given to you by your health care provider. Make sure you discuss any questions you have with your health care provider. Document Released: 02/17/2000 Document Revised: 07/22/2015 Document Reviewed: 11/20/2013 Elsevier Interactive Patient Education  [employer].

## 2017-07-25 ENCOUNTER — Emergency Department (HOSPITAL_COMMUNITY): Payer: Medicare Other

## 2017-07-25 ENCOUNTER — Encounter (HOSPITAL_COMMUNITY): Payer: Self-pay

## 2017-07-25 ENCOUNTER — Emergency Department (HOSPITAL_COMMUNITY)
Admission: EM | Admit: 2017-07-25 | Discharge: 2017-07-25 | Disposition: A | Discharge status: Home / Self Care | Payer: Medicare Other | Attending: Emergency Medicine | Admitting: Emergency Medicine

## 2017-07-25 DIAGNOSIS — K8020 Calculus of gallbladder without cholecystitis without obstruction: Secondary | ICD-10-CM | POA: Insufficient documentation

## 2017-07-25 DIAGNOSIS — I110 Hypertensive heart disease with heart failure: Secondary | ICD-10-CM | POA: Diagnosis not present

## 2017-07-25 DIAGNOSIS — R109 Unspecified abdominal pain: Secondary | ICD-10-CM

## 2017-07-25 DIAGNOSIS — N12 Tubulo-interstitial nephritis, not specified as acute or chronic: Secondary | ICD-10-CM | POA: Insufficient documentation

## 2017-07-25 DIAGNOSIS — Z91040 Latex allergy status: Secondary | ICD-10-CM | POA: Diagnosis not present

## 2017-07-25 DIAGNOSIS — Z7982 Long term (current) use of aspirin: Secondary | ICD-10-CM | POA: Insufficient documentation

## 2017-07-25 DIAGNOSIS — Z950 Presence of cardiac pacemaker: Secondary | ICD-10-CM | POA: Diagnosis not present

## 2017-07-25 DIAGNOSIS — I2510 Atherosclerotic heart disease of native coronary artery without angina pectoris: Secondary | ICD-10-CM | POA: Insufficient documentation

## 2017-07-25 DIAGNOSIS — N10 Acute tubulo-interstitial nephritis: Secondary | ICD-10-CM | POA: Diagnosis not present

## 2017-07-25 DIAGNOSIS — I509 Heart failure, unspecified: Secondary | ICD-10-CM | POA: Diagnosis not present

## 2017-07-25 DIAGNOSIS — E039 Hypothyroidism, unspecified: Secondary | ICD-10-CM | POA: Diagnosis not present

## 2017-07-25 DIAGNOSIS — R1013 Epigastric pain: Secondary | ICD-10-CM | POA: Diagnosis not present

## 2017-07-25 LAB — BASIC METABOLIC PANEL
Anion gap: 14 (ref 5–15)
BUN: 22 mg/dL — ABNORMAL HIGH (ref 6–20)
CHLORIDE: 100 mmol/L — AB (ref 101–111)
CO2: 24 mmol/L (ref 22–32)
Calcium: 9.8 mg/dL (ref 8.9–10.3)
Creatinine, Ser: 1.05 mg/dL — ABNORMAL HIGH (ref 0.44–1.00)
GFR calc non Af Amer: 45 mL/min — ABNORMAL LOW (ref >60)
GFR, EST AFRICAN AMERICAN: 53 mL/min — AB (ref >60)
Glucose, Bld: 148 mg/dL — ABNORMAL HIGH (ref 65–99)
POTASSIUM: 3.7 mmol/L (ref 3.5–5.1)
SODIUM: 138 mmol/L (ref 135–145)

## 2017-07-25 LAB — URINALYSIS, ROUTINE W REFLEX MICROSCOPIC
Bilirubin Urine: NEGATIVE
Glucose, UA: NEGATIVE mg/dL
Hgb urine dipstick: NEGATIVE
Ketones, ur: NEGATIVE mg/dL
Nitrite: NEGATIVE
Protein, ur: 30 mg/dL — AB
SPECIFIC GRAVITY, URINE: 1.019 (ref 1.005–1.030)
WBC, UA: >50 WBC/hpf — ABNORMAL HIGH (ref 0–5)
pH: 5 (ref 5.0–8.0)

## 2017-07-25 LAB — CBC
HEMATOCRIT: 42.4 % (ref 36.0–46.0)
HEMOGLOBIN: 13.9 g/dL (ref 12.0–15.0)
MCH: 31.2 pg (ref 26.0–34.0)
MCHC: 32.8 g/dL (ref 30.0–36.0)
MCV: 95.3 fL (ref 78.0–100.0)
Platelets: 223 10*3/uL (ref 150–400)
RBC: 4.45 MIL/uL (ref 3.87–5.11)
RDW: 13.2 % (ref 11.5–15.5)
WBC: 11 10*3/uL — AB (ref 4.0–10.5)

## 2017-07-25 LAB — HEPATIC FUNCTION PANEL
ALT: 22 U/L (ref 14–54)
AST: 24 U/L (ref 15–41)
Albumin: 3.8 g/dL (ref 3.5–5.0)
Alkaline Phosphatase: 57 U/L (ref 38–126)
BILIRUBIN DIRECT: 0.2 mg/dL (ref 0.1–0.5)
BILIRUBIN TOTAL: 0.9 mg/dL (ref 0.3–1.2)
Indirect Bilirubin: 0.7 mg/dL (ref 0.3–0.9)
Total Protein: 6.7 g/dL (ref 6.5–8.1)

## 2017-07-25 LAB — LIPASE, BLOOD: Lipase: 25 U/L (ref 11–51)

## 2017-07-25 MED ORDER — CEPHALEXIN 250 MG PO CAPS
500.0000 mg | ORAL_CAPSULE | Freq: Once | ORAL | Status: AC
  Administered 2017-07-25: 500 mg via ORAL
  Filled 2017-07-25: qty 2

## 2017-07-25 MED ORDER — IBUPROFEN 400 MG PO TABS
400.0000 mg | ORAL_TABLET | Freq: Once | ORAL | Status: AC | PRN
  Administered 2017-07-25: 400 mg via ORAL
  Filled 2017-07-25: qty 1

## 2017-07-25 MED ORDER — CEPHALEXIN 500 MG PO CAPS: 500.0000 mg | ORAL_CAPSULE | Freq: Two times a day (BID) | ORAL | 0 refills | Status: AC

## 2017-07-25 NOTE — ED Notes (Signed)
D/c reviewed with patient and son 

## 2017-07-25 NOTE — Progress Notes (Signed)
Subjective:    Patient ID: Beverly Kim, female    DOB: Apr 07, 1924, 84 y.o.   MRN: [id number]  HPI The patient is here for follow up.  Hypertension: She is taking her medication daily. She is compliant with a low sodium diet.  She denies chest pain, and regular headaches. She is exercising minimally - stretching exercises in bed.     Hypothyroidism:  She is taking her medication daily.  She denies any recent changes in energy or weight that are unexplained.   Prediabetes:  She is compliant with a low sugar/carbohydrate diet.  She is exercising some.  GERD:  She is taking her medication daily as prescribed.  She denies any GERD symptoms and feels her GERD is well controlled.    ED 5/23 for right flank pain.  The pain started the night before and was constant.  She states she ate dinner around 6:30 at night and the pain started in her right upper quadrant at 8:30 at night.  At 4 AM her son took her to the emergency room.  Her pain was 7/10.  Ibuprofen in the waiting room resolved the pain.  She denied other symptoms.  Her abdomen was tender in the epigastric region and she had right CVA tenderness.  UA showed probable UTI.  She did not have any urinary symptoms.  Received keflex in ED.  CT of Ab/P showed no kidney stone.  There was some GB swelling and she did have RUQ tenderness on reevaluation.  Abdominal US was ordered and showed small stones and sludge w/o pericholecystic fluid or wall thickening.  She was asymptomatic so unlikely cholecystitis. She was discharged home on keflex.  She was advised f/u with PCP and surgery.  She still has some tenderness when she pushes on the right upper quadrant.  She denies any fevers.   Medications and allergies reviewed with patient and updated if appropriate.  Patient Active Problem List   Diagnosis Date Noted  . Calculus of gallbladder without cholecystitis without obstruction 07/26/2017  . Pyelonephritis 07/25/2017  . Carotid disease,  bilateral ([place]) 04/08/2017  . Leg pain 04/08/2017  . Non-rheumatic mitral regurgitation 07/12/2016  . Complete heart block ([place]) 05/14/2016  . Prediabetes 01/05/2016  . Degenerative disc disease, cervical 08/23/2015  . Degenerative disc disease, lumbar 08/23/2015  . Left rotator cuff tear arthropathy 07/12/2015  . Left shoulder pain 04/06/2015  . Frozen shoulder 01/03/2015  . Cough variant asthma 12/08/2013  . Upper airway cough syndrome 10/27/2013  . Dyspnea 02/03/2013  . GERD 07/06/2009  . HIATAL HERNIA 07/06/2009  . DIVERTICULOSIS, COLON 07/06/2009  . HEPATIC CYST 07/06/2009  . COLONIC POLYPS, HX OF 07/06/2009  . MIXED HEARING LOSS BILATERAL 12/13/2008  . Essential hypertension 06/19/2008  . MITRAL VALVE PROLAPSE 06/19/2008  . LBBB (left bundle branch block) 06/19/2008  . Hyperlipidemia 08/12/2007  . Hypothyroidism 12/02/2006  . CAD (coronary artery disease) 12/02/2006  . Acute on chronic diastolic heart failure ([place]) 12/02/2006    Current Outpatient Medications on File Prior to Visit  Medication Sig Dispense Refill  . albuterol (PROVENTIL HFA;VENTOLIN HFA) 108 (90 Base) MCG/ACT inhaler Inhale 1-2 puffs into the lungs every 6 (six) hours as needed for wheezing or shortness of breath. 1 Inhaler 0  . aspirin 81 MG tablet Take 81 mg by mouth daily.    Marland Kitchen BIOTIN PO Take 1 tablet by mouth daily.    . bisoprolol (ZEBETA) 10 MG tablet TAKE 1 TABLET BY MOUTH ONCE DAILY 90 tablet  1  . calcium carbonate (TUMS - DOSED IN MG ELEMENTAL CALCIUM) 500 MG chewable tablet Chew 1 tablet by mouth daily as needed for indigestion or heartburn.    . cephALEXin (KEFLEX) 500 MG capsule Take 1 capsule (500 mg total) by mouth 2 (two) times daily for 10 days. 20 capsule 0  . Cholecalciferol (VITAMIN D3 PO) Take 2 capsules by mouth daily.    . furosemide (LASIX) 40 MG tablet Take 1 tablet (40 mg total) by mouth daily. 30 tablet 11  . metolazone (ZAROXOLYN) 2.5 MG tablet Take 1 tablet (2.5 mg total) by  mouth 3 (three) times a week. (Patient taking differently: Take 2.5 mg by mouth 3 (three) times a week. Monday, Wednesday, Friday. Take 30 minutes prior to lasix.) 30 tablet 6  . Multiple Vitamins-Minerals (CENTRUM SILVER PO) Take 1 tablet by mouth daily.    . Omega-3 Fatty Acids (FISH OIL PO) Take 1 capsule by mouth daily.    . ranitidine (ZANTAC) 150 MG tablet Take 1 tablet (150 mg total) by mouth 2 (two) times daily. 60 tablet 5  . SYNTHROID 88 MCG tablet TAKE 1 TABLET BY MOUTH DAILY BEFORE BREAKFAST 90 tablet 1  . vitamin C (ASCORBIC ACID) 500 MG tablet Take 500 mg by mouth daily.     No current facility-administered medications on file prior to visit.     Past Medical History:  Diagnosis Date  . Atrial fibrillation ([place]) 08/16/2016   observed on PPM interrogation, asymptomatic, chad2vasc score is 4.  will consider anticoagulation  . CHF (congestive heart failure) ([place])   . Colon polyps   . Coronary artery disease   . Diverticulosis   . Dyspnea   . GERD (gastroesophageal reflux disease)   . Hearing loss of both ears   . Hepatic cyst   . Hiatal hernia   . Hyperlipidemia   . Hypertension   . Hypothyroidism   . Low back pain   . Mitral valve disorders(424.0)   . Other left bundle branch block   . Palpitations   . URI (upper respiratory infection)     Past Surgical History:  Procedure Laterality Date  . CARDIAC CATHETERIZATION  04-18-01  . COLONOSCOPY    . HERNIA REPAIR    . PACEMAKER IMPLANT Left 05/15/2016   SJM Assirity MRI dual chamber PPM implanted by Dr Rayann Heman for CHB  . THYROIDECTOMY    . TONSILLECTOMY AND ADENOIDECTOMY    . TOTAL ABDOMINAL HYSTERECTOMY W/ BILATERAL SALPINGOOPHORECTOMY      Social History   Socioeconomic History  . Marital status: Widowed    Spouse name: Not on file  . Number of children: 4  . Years of education: Not on file  . Highest education level: Not on file  Occupational History  . Occupation: retired  Scientific laboratory technician  . Financial  resource strain: Not hard at all  . Food insecurity:    Worry: Never true    Inability: Never true  . Transportation needs:    Medical: No    Non-medical: No  Tobacco Use  . Smoking status: Never Smoker  . Smokeless tobacco: Never Used  Substance and Sexual Activity  . Alcohol use: No  . Drug use: No  . Sexual activity: Never  Lifestyle  . Physical activity:    Days per week: 0 days    Minutes per session: 0 min  . Stress: Not at all  Relationships  . Social connections:    Talks on phone: More than three times  a week    Gets together: More than three times a week    Attends religious service: More than 4 times per year    Active member of club or organization: Yes    Attends meetings of clubs or organizations: More than 4 times per year    Relationship status: Widowed  Other Topics Concern  . Not on file  Social History Narrative  . Not on file    Family History  Problem Relation Age of Onset  . Coronary artery disease Unknown        family hx of 1st degree relative <50  . Diabetes Unknown        family hx of  . Other Unknown        cardiovascular disorder family hx of  . Other Unknown        neurological disorder family hx of  . Other Unknown        respiratory disease family hx of  . Heart attack Daughter   . Heart Problems Unknown        all children  . Sudden death Son   . Heart disease Brother   . Heart disease Sister   . Colon cancer Neg Hx   . Colon polyps Neg Hx     Review of Systems  Constitutional: Positive for chills. Negative for fever.  Respiratory: Positive for shortness of breath (chronic at times). Negative for cough and wheezing.   Cardiovascular: Positive for palpitations (occasional) and leg swelling (L > R). Negative for chest pain.  Gastrointestinal: Positive for abdominal pain (RUQ tenderness to palpation).  Genitourinary: Negative for dysuria, frequency and hematuria.  Neurological: Positive for light-headedness (when she takes teh  zaroxolyn). Negative for headaches.       Objective:   Vitals:   07/26/17 1059  BP: (!) 124/56  Pulse: 77  Resp: 16  Temp: 98.1 F (36.7 C)  SpO2: 96%   BP Readings from Last 3 Encounters:  07/26/17 (!) 124/56  07/25/17 (!) 146/85  07/19/17 (!) 142/68   Wt Readings from Last 3 Encounters:  07/26/17 179 lb (81.2 kg)  07/19/17 182 lb (82.6 kg)  06/26/17 182 lb (82.6 kg)   Body mass index is 31.71 kg/m.   Physical Exam    Constitutional: Appears well-developed and well-nourished. No distress.  HENT:  Head: Normocephalic and atraumatic.  Neck: Neck supple. No tracheal deviation present. No thyromegaly present.  No cervical lymphadenopathy Cardiovascular: Normal rate, regular rhythm and normal heart sounds.   1/6 systolic murmur heard. No carotid bruit .  LLE mild, none in RLE edema Pulmonary/Chest: Effort normal and breath sounds normal. No respiratory distress. No has no wheezes. No rales.  Abdomen: soft, mild tenderness in RUQ w/o rebound or guarding, no distention  Skin: Skin is warm and dry. Not diaphoretic.  Psychiatric: Normal mood and affect. Behavior is normal.      Assessment & Plan:    See Problem List for Assessment and Plan of chronic medical problems.

## 2017-07-25 NOTE — Discharge Instructions (Signed)
If your pain worsens, recurs and does not go away, or you develop fevers, vomiting, or other new/concerning symptoms then return to the ER for evaluation.  The ultrasound showed stones in your gallbladder which could be contributing to your pain.  You also appear to have a urinary tract infection and thus need to take the antibiotics to completion, even if you are feeling better.

## 2017-07-25 NOTE — ED Provider Notes (Signed)
[facility] EMERGENCY DEPARTMENT Provider Note   CSN: [id number] Arrival date & time: 07/25/17  0359     History   Chief Complaint Chief Complaint  Patient presents with  . Flank Pain    HPI Beverly Kim is a 84 y.o. female.  HPI  84 year old female presents with right flank pain.  Started last night around 8 PM and has been constant since.  She is unable to describe exactly what it felt like except that it was quite painful and about a 7/10 at its worst.  She was given ibuprofen in the waiting room and states that about 30 minutes later, her pain has resolved.  No fevers, vomiting, diarrhea.  No urinary symptoms.  She often has urinary frequency when taking her diuretics but has not noticed any new urinary symptoms.  Currently has 0 pain. No similar prior symptoms. Pain did not radiate.  Past Medical History:  Diagnosis Date  . Atrial fibrillation ([place]) 08/16/2016   observed on PPM interrogation, asymptomatic, chad2vasc score is 4.  will consider anticoagulation  . CHF (congestive heart failure) ([place])   . Colon polyps   . Coronary artery disease   . Diverticulosis   . Dyspnea   . GERD (gastroesophageal reflux disease)   . Hearing loss of both ears   . Hepatic cyst   . Hiatal hernia   . Hyperlipidemia   . Hypertension   . Hypothyroidism   . Low back pain   . Mitral valve disorders(424.0)   . Other left bundle branch block   . Palpitations   . URI (upper respiratory infection)     Patient Active Problem List   Diagnosis Date Noted  . Carotid disease, bilateral ([place]) 04/08/2017  . Leg pain 04/08/2017  . Acute bronchitis 03/19/2017  . Non-rheumatic mitral regurgitation 07/12/2016  . Complete heart block ([place]) 05/14/2016  . Bradycardia 05/14/2016  . Wheezing 03/20/2016  . Prediabetes 01/05/2016  . Degenerative disc disease, cervical 08/23/2015  . Degenerative disc disease, lumbar 08/23/2015  . Left rotator cuff tear arthropathy 07/12/2015  .  Left shoulder pain 04/06/2015  . Frozen shoulder 01/03/2015  . Cough variant asthma 12/08/2013  . Upper airway cough syndrome 10/27/2013  . Dyspnea 02/03/2013  . GERD 07/06/2009  . HIATAL HERNIA 07/06/2009  . DIVERTICULOSIS, COLON 07/06/2009  . HEPATIC CYST 07/06/2009  . COLONIC POLYPS, HX OF 07/06/2009  . MIXED HEARING LOSS BILATERAL 12/13/2008  . Essential hypertension 06/19/2008  . MITRAL VALVE PROLAPSE 06/19/2008  . LBBB (left bundle branch block) 06/19/2008  . Hyperlipidemia 08/12/2007  . Hypothyroidism 12/02/2006  . CAD (coronary artery disease) 12/02/2006  . Acute on chronic diastolic heart failure ([place]) 12/02/2006    Past Surgical History:  Procedure Laterality Date  . CARDIAC CATHETERIZATION  04-18-01  . COLONOSCOPY    . HERNIA REPAIR    . PACEMAKER IMPLANT Left 05/15/2016   SJM Assirity MRI dual chamber PPM implanted by Dr Rayann Heman for CHB  . THYROIDECTOMY    . TONSILLECTOMY AND ADENOIDECTOMY    . TOTAL ABDOMINAL HYSTERECTOMY W/ BILATERAL SALPINGOOPHORECTOMY       OB History   None      Home Medications    Prior to Admission medications   Medication Sig Start Date End Date Taking? Authorizing Provider  albuterol (PROVENTIL HFA;VENTOLIN HFA) 108 (90 Base) MCG/ACT inhaler Inhale 1-2 puffs into the lungs every 6 (six) hours as needed for wheezing or shortness of breath. 03/05/17  Yes Sherwood Gambler, MD  aspirin  81 MG tablet Take 81 mg by mouth daily.   Yes [provider]  BIOTIN PO Take 1 tablet by mouth daily.   Yes [provider]  bisoprolol (ZEBETA) 10 MG tablet TAKE 1 TABLET BY MOUTH ONCE DAILY 04/24/17  Yes Burns, Claudina Lick, MD  calcium carbonate (TUMS - DOSED IN MG ELEMENTAL CALCIUM) 500 MG chewable tablet Chew 1 tablet by mouth daily as needed for indigestion or heartburn.   Yes [provider]  Cholecalciferol (VITAMIN D3 PO) Take 2 capsules by mouth daily.   Yes [provider]  furosemide (LASIX) 40 MG tablet Take 1 tablet  (40 mg total) by mouth daily. 02/22/17  Yes Minus Breeding, MD  metolazone (ZAROXOLYN) 2.5 MG tablet Take 1 tablet (2.5 mg total) by mouth 3 (three) times a week. Patient taking differently: Take 2.5 mg by mouth 3 (three) times a week. Monday, Wednesday, Friday. Take 30 minutes prior to lasix. 05/10/17 08/08/17 Yes Minus Breeding, MD  Multiple Vitamins-Minerals (CENTRUM SILVER PO) Take 1 tablet by mouth daily.   Yes [provider]  Omega-3 Fatty Acids (FISH OIL PO) Take 1 capsule by mouth daily.   Yes [provider]  SYNTHROID 88 MCG tablet TAKE 1 TABLET BY MOUTH DAILY BEFORE BREAKFAST 05/13/17  Yes Burns, Claudina Lick, MD  vitamin C (ASCORBIC ACID) 500 MG tablet Take 500 mg by mouth daily.   Yes [provider]  cephALEXin (KEFLEX) 500 MG capsule Take 1 capsule (500 mg total) by mouth 2 (two) times daily for 10 days. 07/25/17 08/04/17  Sherwood Gambler, MD  ranitidine (ZANTAC) 150 MG tablet Take 1 tablet (150 mg total) by mouth 2 (two) times daily. 03/19/17   [name], MD    Family History Family History  Problem Relation Age of Onset  . Coronary artery disease Unknown        family hx of 1st degree relative <50  . Diabetes Unknown        family hx of  . Other Unknown        cardiovascular disorder family hx of  . Other Unknown        neurological disorder family hx of  . Other Unknown        respiratory disease family hx of  . Heart attack Daughter   . Heart Problems Unknown        all children  . Sudden death Son   . Heart disease Brother   . Heart disease Sister   . Colon cancer Neg Hx   . Colon polyps Neg Hx     Social History Social History   Tobacco Use  . Smoking status: Never Smoker  . Smokeless tobacco: Never Used  Substance Use Topics  . Alcohol use: No  . Drug use: No     Allergies   Adhesive [tape]; Ace inhibitors; Atorvastatin; Clindamycin; Codeine; Latex; Shellfish allergy; Simvastatin; and Penicillins   Review of Systems Review  of Systems  Constitutional: Negative for fever.  Respiratory: Negative for shortness of breath.   Cardiovascular: Negative for chest pain.  Gastrointestinal: Negative for abdominal pain and vomiting.  Genitourinary: Positive for flank pain. Negative for dysuria and hematuria.  All other systems reviewed and are negative.    Physical Exam Updated Vital Signs BP (!) 146/85   Pulse 65   Temp 97.7 F (36.5 C) (Oral)   Resp 12   SpO2 97%   Physical Exam  Constitutional: She is oriented to person, place, and  time. She appears well-developed and well-nourished. No distress.  HENT:  Head: Normocephalic and atraumatic.  Right Ear: External ear normal.  Left Ear: External ear normal.  Nose: Nose normal.  Eyes: Right eye exhibits no discharge. Left eye exhibits no discharge.  Cardiovascular: Normal rate, regular rhythm and normal heart sounds.  Pulmonary/Chest: Effort normal and breath sounds normal.  Abdominal: Soft. There is tenderness (mild) in the epigastric area. There is CVA tenderness (mild, right).  Neurological: She is alert and oriented to person, place, and time.  Skin: Skin is warm and dry. She is not diaphoretic.  Nursing note and vitals reviewed.    ED Treatments / Results  Labs (all labs ordered are listed, but only abnormal results are displayed) Labs Reviewed  URINALYSIS, ROUTINE W REFLEX MICROSCOPIC - Abnormal; Notable for the following components:      Result Value   APPearance HAZY (*)    Protein, ur 30 (*)    Leukocytes, UA MODERATE (*)    WBC, UA >50 (*)    Bacteria, UA RARE (*)    Non Squamous Epithelial 0-5 (*)    All other components within normal limits  BASIC METABOLIC PANEL - Abnormal; Notable for the following components:   Chloride 100 (*)    Glucose, Bld 148 (*)    BUN 22 (*)    Creatinine, Ser 1.05 (*)    GFR calc non Af Amer 45 (*)    GFR calc Af Amer 53 (*)    All other components within normal limits  CBC - Abnormal; Notable for the  following components:   WBC 11.0 (*)    All other components within normal limits  URINE CULTURE  HEPATIC FUNCTION PANEL  LIPASE, BLOOD    EKG None  Radiology Ct Renal Stone Study  Result Date: 07/25/2017 CLINICAL DATA:  Right flank pain EXAM: CT ABDOMEN AND PELVIS WITHOUT CONTRAST TECHNIQUE: Multidetector CT imaging of the abdomen and pelvis was performed following the standard protocol without IV contrast. COMPARISON:  07/11/2009 FINDINGS: Lower chest: Cardiomegaly. Pacer wires noted in the right heart. No acute abnormality. Hepatobiliary: Gallbladder is mildly distended. No visible stones. No focal hepatic abnormality. Pancreas: No focal abnormality or ductal dilatation. Spleen: Calcifications throughout the spleen.  Normal size. Adrenals/Urinary Tract: No renal or ureteral stones. No hydronephrosis. Urinary bladder and adrenal glands unremarkable. Stomach/Bowel: Descending colonic and sigmoid diverticulosis. No active diverticulitis. Stomach, large and small bowel grossly unremarkable. Vascular/Lymphatic: Aortic atherosclerosis. No enlarged abdominal or pelvic lymph nodes. Reproductive: Prior hysterectomy.  No adnexal masses. Other: No free fluid or free air. Musculoskeletal: No acute bony abnormality. Degenerative changes in the lumbar spine. Grade 1 anterolisthesis of L5 on S1 related to facet disease. IMPRESSION: No renal or ureteral stones.  No hydronephrosis. Gallbladder is mildly distended without visible stones or wall thickening. Recommend clinical correlation. Consider right upper quadrant ultrasound if further evaluation is felt warranted. Old granulomatous disease in the spleen. Left colonic diverticulosis.  No active diverticulitis. Aortic atherosclerosis. Electronically Signed   By: Rolm Baptise M.D.   On: 07/25/2017 09:43   US Abdomen Limited Ruq  Result Date: 07/25/2017 CLINICAL DATA:  Right flank pain.  Follow-up CT scan from today. EXAM: ULTRASOUND ABDOMEN LIMITED RIGHT UPPER  QUADRANT COMPARISON:  CT scan 07/25/2017 FINDINGS: Gallbladder: Dependent layering small gallstones and echogenic sludge in the gallbladder. No gallbladder wall thickening. Equivocal sonographic Murphy sign. No pericholecystic fluid. Common bile duct: Diameter: 6.0 mm Liver: No focal lesion identified. Within normal limits in parenchymal  echogenicity. Portal vein is patent on color Doppler imaging with normal direction of blood flow towards the liver. IMPRESSION: Cholelithiasis and gallbladder sludge without definite sonographic findings for acute cholecystitis. Equivocal sonographic Murphy sign. Normal caliber common bile duct. Electronically Signed   By: Marijo Sanes M.D.   On: 07/25/2017 11:35    Procedures Procedures (including critical care time)  Medications Ordered in ED Medications  ibuprofen (ADVIL,MOTRIN) tablet 400 mg (400 mg Oral Given 07/25/17 0416)  cephALEXin (KEFLEX) capsule 500 mg (500 mg Oral Given 07/25/17 0913)     Initial Impression / Assessment and Plan / ED Course  I have reviewed the triage vital signs and the nursing notes.  Pertinent labs & imaging results that were available during my care of the patient were reviewed by me and considered in my medical decision making (see chart for details).  Clinical Course as of Jul 26 1251  Thu Jul 25, 2017  1001 Patient denies active pain at this time.  CT scan shows no obvious ureteral or kidney stone.  Urine is concerning for a UTI which would make this pyelonephritis.  However there is some gallbladder swelling on the CT and on reevaluation she does have tenderness in her right upper quadrant.  I will add LFTs, lipase, and do a right upper quadrant ultrasound.   [SG]    Clinical Course User Index [SG] Sherwood Gambler, MD    Patient's right flank pain could be from gallbladder versus pyelonephritis.  Given the urine findings, I will cover for kidney infection although she does not have any specific urinary tract symptoms.   Right upper quadrant is mildly tender and ultrasound shows small stones and sludge but no pericholecystic fluid or wall thickening.  Given she is currently asymptomatic, I highly doubt this is cholecystitis.  Cover with Keflex for the urine and we have discussed strict return precautions with her and her son.  Plan for discharge home with outpatient follow-up with PCP, general surgery, and we discussed the return precautions to bring her back to the ER.  Final Clinical Impressions(s) / ED Diagnoses   Final diagnoses:  Right flank pain  Calculus of gallbladder without cholecystitis without obstruction  Pyelonephritis    ED Discharge Orders        Ordered    cephALEXin (KEFLEX) 500 MG capsule  2 times daily     07/25/17 1154       Sherwood Gambler, MD 07/25/17 1254

## 2017-07-25 NOTE — ED Triage Notes (Signed)
Pt reports that since yesterday evening she has been having R sided flank pain, denies urinary symptoms, denies nausea/diarrhea/fevers or abd pain.

## 2017-07-25 NOTE — ED Notes (Signed)
Patient transported to CT 

## 2017-07-25 NOTE — ED Notes (Signed)
ED Provider at bedside. 

## 2017-07-26 ENCOUNTER — Ambulatory Visit (INDEPENDENT_AMBULATORY_CARE_PROVIDER_SITE_OTHER): Payer: Medicare Other | Admitting: Internal Medicine

## 2017-07-26 ENCOUNTER — Encounter: Payer: Self-pay | Admitting: Internal Medicine

## 2017-07-26 VITALS — BP 124/56 | HR 77 | Temp 98.1°F | Resp 16 | Wt 179.0 lb

## 2017-07-26 DIAGNOSIS — E038 Other specified hypothyroidism: Secondary | ICD-10-CM | POA: Diagnosis not present

## 2017-07-26 DIAGNOSIS — I10 Essential (primary) hypertension: Secondary | ICD-10-CM

## 2017-07-26 DIAGNOSIS — N12 Tubulo-interstitial nephritis, not specified as acute or chronic: Secondary | ICD-10-CM | POA: Diagnosis not present

## 2017-07-26 DIAGNOSIS — K8020 Calculus of gallbladder without cholecystitis without obstruction: Secondary | ICD-10-CM | POA: Insufficient documentation

## 2017-07-26 DIAGNOSIS — R7303 Prediabetes: Secondary | ICD-10-CM | POA: Diagnosis not present

## 2017-07-26 DIAGNOSIS — K219 Gastro-esophageal reflux disease without esophagitis: Secondary | ICD-10-CM | POA: Diagnosis not present

## 2017-07-26 LAB — URINE CULTURE: Culture: NO GROWTH

## 2017-07-26 NOTE — Assessment & Plan Note (Signed)
Symptoms yesterday consistent with cholelithiasis and gallbladder dyskinesia- She still has some mild tenderness in the right upper quadrant Advised low-fat, bland diet Will refer to surgery for further evaluation-consideration of cholecystectomy

## 2017-07-26 NOTE — Assessment & Plan Note (Signed)
Diagnosed with pyelonephritis in the ED-I am not so sure if she truly had pyelonephritis, but UTI possible.  Her CVA tenderness was likely related to her gallbladder She did not have any urinary symptoms and denies urinary symptoms today UTI being treated with Keflex Complete Keflex

## 2017-07-26 NOTE — Assessment & Plan Note (Signed)
Compliant with low sugar diet Last A1c well controlled We will recheck in 6 months

## 2017-07-26 NOTE — Assessment & Plan Note (Signed)
GERD controlled Continue daily medication  

## 2017-07-26 NOTE — Assessment & Plan Note (Signed)
BP well controlled Current regimen effective and well tolerated Continue current medications at current doses  

## 2017-07-26 NOTE — Assessment & Plan Note (Signed)
She just had blood work done in the emergency room yesterday and would like to avoid medication if possible Euthyroid clinically We will hold off on TSH until her next visit Continue current dose of medication

## 2017-07-26 NOTE — Patient Instructions (Addendum)
No immunizations administered today.   Medications reviewed and updated.  No changes recommended at this time.  Complete the antibiotic.     A referral was ordered for surgery.  They will call you to schedule an appointment.    Please followup in 6 months, sooner if needed

## 2017-08-09 DIAGNOSIS — K8010 Calculus of gallbladder with chronic cholecystitis without obstruction: Secondary | ICD-10-CM | POA: Diagnosis not present

## 2017-08-15 ENCOUNTER — Ambulatory Visit (INDEPENDENT_AMBULATORY_CARE_PROVIDER_SITE_OTHER): Payer: Medicare Other | Admitting: *Deleted

## 2017-08-15 DIAGNOSIS — I442 Atrioventricular block, complete: Secondary | ICD-10-CM | POA: Diagnosis not present

## 2017-08-15 NOTE — Progress Notes (Signed)
Remote pacemaker transmission.   

## 2017-08-23 ENCOUNTER — Other Ambulatory Visit: Payer: Self-pay | Admitting: Emergency Medicine

## 2017-08-23 MED ORDER — BISOPROLOL FUMARATE 10 MG PO TABS: 10.0000 mg | ORAL_TABLET | Freq: Every day | ORAL | 1 refills | Status: DC

## 2017-08-23 MED ORDER — SYNTHROID 88 MCG PO TABS: ORAL_TABLET | ORAL | 1 refills | Status: DC

## 2017-08-28 ENCOUNTER — Encounter: Payer: Medicare Other | Admitting: Internal Medicine

## 2017-08-30 ENCOUNTER — Encounter: Payer: Self-pay | Admitting: Internal Medicine

## 2017-09-06 LAB — CUP PACEART REMOTE DEVICE CHECK
Battery Remaining Longevity: 112 mo
Brady Statistic AP VS Percent: 1 %
Brady Statistic AS VP Percent: 31 %
Implantable Lead Location: 753860
Lead Channel Impedance Value: 410 Ohm
Lead Channel Pacing Threshold Amplitude: 0.75 V
Lead Channel Sensing Intrinsic Amplitude: 12 mV
Lead Channel Setting Pacing Amplitude: 2.5 V
Lead Channel Setting Pacing Pulse Width: 0.5 ms
MDC IDC LEAD IMPLANT DT: 20180313
MDC IDC LEAD IMPLANT DT: 20180313
MDC IDC LEAD LOCATION: 753859
MDC IDC MSMT BATTERY REMAINING PERCENTAGE: 95.5 %
MDC IDC MSMT BATTERY VOLTAGE: 2.99 V
MDC IDC MSMT LEADCHNL RA PACING THRESHOLD AMPLITUDE: 1.5 V
MDC IDC MSMT LEADCHNL RA PACING THRESHOLD PULSEWIDTH: 0.5 ms
MDC IDC MSMT LEADCHNL RA SENSING INTR AMPL: 2.5 mV
MDC IDC MSMT LEADCHNL RV IMPEDANCE VALUE: 510 Ohm
MDC IDC MSMT LEADCHNL RV PACING THRESHOLD PULSEWIDTH: 0.5 ms
MDC IDC PG IMPLANT DT: 20180313
MDC IDC SESS DTM: 20190613060030
MDC IDC SET LEADCHNL RV PACING AMPLITUDE: 1 V
MDC IDC SET LEADCHNL RV SENSING SENSITIVITY: 4 mV
MDC IDC STAT BRADY AP VP PERCENT: 64 %
MDC IDC STAT BRADY AS VS PERCENT: 1 %
MDC IDC STAT BRADY RA PERCENT PACED: 62 %
MDC IDC STAT BRADY RV PERCENT PACED: 95 %
Pulse Gen Serial Number: 8005625

## 2017-09-09 ENCOUNTER — Encounter: Payer: Self-pay | Admitting: Internal Medicine

## 2017-09-09 ENCOUNTER — Ambulatory Visit: Payer: Medicare Other | Admitting: Internal Medicine

## 2017-09-09 ENCOUNTER — Encounter (INDEPENDENT_AMBULATORY_CARE_PROVIDER_SITE_OTHER): Payer: Self-pay

## 2017-09-09 VITALS — BP 132/76 | HR 64 | Ht 63.0 in | Wt 185.0 lb

## 2017-09-09 DIAGNOSIS — Z950 Presence of cardiac pacemaker: Secondary | ICD-10-CM | POA: Diagnosis not present

## 2017-09-09 DIAGNOSIS — I442 Atrioventricular block, complete: Secondary | ICD-10-CM

## 2017-09-09 DIAGNOSIS — I480 Paroxysmal atrial fibrillation: Secondary | ICD-10-CM

## 2017-09-09 MED ORDER — BISOPROLOL FUMARATE 5 MG PO TABS: 5.0000 mg | ORAL_TABLET | Freq: Every day | ORAL | 3 refills | Status: DC

## 2017-09-09 NOTE — Progress Notes (Signed)
PCP: [name], MD Primary Cardiologist: Dr Percival Spanish Primary EP:  Dr Rayann Heman  Beverly Kim is a 84 y.o. female who presents today for routine electrophysiology followup.  Since last being seen in our clinic, the patient reports doing reasonably well.  She has some fatigue.   Today, she denies symptoms of palpitations, chest pain, shortness of breath,  lower extremity edema, dizziness, presyncope, or syncope.  The patient is otherwise without complaint today.   Past Medical History:  Diagnosis Date  . Atrial fibrillation ([place]) 08/16/2016   observed on PPM interrogation, asymptomatic, chad2vasc score is 4.  will consider anticoagulation  . CHF (congestive heart failure) ([place])   . Colon polyps   . Coronary artery disease   . Diverticulosis   . Dyspnea   . GERD (gastroesophageal reflux disease)   . Hearing loss of both ears   . Hepatic cyst   . Hiatal hernia   . Hyperlipidemia   . Hypertension   . Hypothyroidism   . Low back pain   . Mitral valve disorders(424.0)   . Other left bundle branch block   . Palpitations   . URI (upper respiratory infection)    Past Surgical History:  Procedure Laterality Date  . CARDIAC CATHETERIZATION  04-18-01  . COLONOSCOPY    . HERNIA REPAIR    . PACEMAKER IMPLANT Left 05/15/2016   SJM Assirity MRI dual chamber PPM implanted by Dr Rayann Heman for CHB  . THYROIDECTOMY    . TONSILLECTOMY AND ADENOIDECTOMY    . TOTAL ABDOMINAL HYSTERECTOMY W/ BILATERAL SALPINGOOPHORECTOMY      ROS- all systems are reviewed and negative except as per HPI above  Current Outpatient Medications  Medication Sig Dispense Refill  . albuterol (PROVENTIL HFA;VENTOLIN HFA) 108 (90 Base) MCG/ACT inhaler Inhale 1-2 puffs into the lungs every 6 (six) hours as needed for wheezing or shortness of breath. 1 Inhaler 0  . aspirin 81 MG tablet Take 81 mg by mouth daily.    Marland Kitchen BIOTIN PO Take 1 tablet by mouth daily.    . bisoprolol (ZEBETA) 10 MG tablet Take 1 tablet (10 mg  total) by mouth daily. 90 tablet 1  . calcium carbonate (TUMS - DOSED IN MG ELEMENTAL CALCIUM) 500 MG chewable tablet Chew 1 tablet by mouth daily as needed for indigestion or heartburn.    . Cholecalciferol (VITAMIN D3 PO) Take 2 capsules by mouth daily.    . furosemide (LASIX) 40 MG tablet Take 1 tablet (40 mg total) by mouth daily. 30 tablet 11  . Multiple Vitamins-Minerals (CENTRUM SILVER PO) Take 1 tablet by mouth daily.    . Omega-3 Fatty Acids (FISH OIL PO) Take 1 capsule by mouth daily.    . ranitidine (ZANTAC) 150 MG tablet Take 1 tablet (150 mg total) by mouth 2 (two) times daily. 60 tablet 5  . SYNTHROID 88 MCG tablet TAKE 1 TABLET BY MOUTH DAILY BEFORE BREAKFAST 90 tablet 1  . vitamin C (ASCORBIC ACID) 500 MG tablet Take 500 mg by mouth daily.    . metolazone (ZAROXOLYN) 2.5 MG tablet Take 1 tablet (2.5 mg total) by mouth 3 (three) times a week. (Patient taking differently: Take 2.5 mg by mouth 3 (three) times a week. Monday, Wednesday, Friday. Take 30 minutes prior to lasix.) 30 tablet 6   No current facility-administered medications for this visit.     Physical Exam: Vitals:   09/09/17 1458  BP: 132/76  Pulse: 64  SpO2: 97%  Weight: 185  lb (83.9 kg)  Height: 5\' 3"  (1.6 m)    GEN- The patient is well appearing, alert and oriented x 3 today.   Head- normocephalic, atraumatic Eyes-  Sclera clear, conjunctiva pink Ears- hearing intact Oropharynx- clear Lungs- Clear to ausculation bilaterally, normal work of breathing Chest- pacemaker pocket is well healed Heart- Regular rate and rhythm, no murmurs, rubs or gallops, PMI not laterally displaced GI- soft, NT, ND, + BS Extremities- no clubbing, cyanosis, or edema  Pacemaker interrogation- reviewed in detail today,  See PACEART report  ekg tracing ordered today is personally reviewed and shows AV paced rhythm  Assessment and Plan:  1. Symptomatic complete heart block Normal pacemaker function See Pace Art  report Histogram is age appropriate No changes today Given fatigue, will reduce bisoprolol to 5mg  daily  2. HTN Stable No change required today  3. Paroxysmal atrial fibrillation Doing well, overall afib burden remains < 1%. Given low burden and advanced age, will not advise [place] unless her afib burden increases  4. Chronic diastolic dysfunction Stable No change required today  5. CAD No ischemic symptoms No changes  [place] Return to see EP PA in a year  [name] MD, [facility] 09/09/2017 3:17 PM

## 2017-09-09 NOTE — Patient Instructions (Addendum)
Medication Instructions:  Your physician has recommended you make the following change in your medication:  1.  Reduce your bisoprolol 10 mg- Take 1/2 tablet by mouth daily.  When you run out of your 10 mg tablets your next prescription was sent in as Bisoprolol 5 mg tablets-then you will take one tablet daily.  Labwork: None ordered.  Testing/Procedures: None ordered.  Follow-Up: Your physician wants you to follow-up in: one year with Beverly Standard, PA.   You will receive a reminder letter in the mail two months in advance. If you don't receive a letter, please call our office to schedule the follow-up appointment.  Remote monitoring is used to monitor your Pacemaker from home. This monitoring reduces the number of office visits required to check your device to one time per year. It allows [place] to keep an eye on the functioning of your device to ensure it is working properly. You are scheduled for a device check from home on 11/14/2017. You may send your transmission at any time that day. If you have a wireless device, the transmission will be sent automatically. After your physician reviews your transmission, you will receive a postcard with your next transmission date.  Any Other Special Instructions Will Be Listed Below (If Applicable).  If you need a refill on your cardiac medications before your next appointment, please call your pharmacy.

## 2017-09-21 LAB — CUP PACEART INCLINIC DEVICE CHECK
Battery Voltage: 2.99 V
Brady Statistic RA Percent Paced: 61 %
Brady Statistic RV Percent Paced: 95 %
Implantable Lead Implant Date: 20180313
Lead Channel Impedance Value: 425 Ohm
Lead Channel Impedance Value: 512.5 Ohm
Lead Channel Pacing Threshold Amplitude: 0.75 V
Lead Channel Pacing Threshold Pulse Width: 0.5 ms
Lead Channel Sensing Intrinsic Amplitude: 2.3 mV
Lead Channel Setting Pacing Amplitude: 1 V
MDC IDC LEAD IMPLANT DT: 20180313
MDC IDC LEAD LOCATION: 753859
MDC IDC LEAD LOCATION: 753860
MDC IDC MSMT BATTERY REMAINING LONGEVITY: 112 mo
MDC IDC MSMT LEADCHNL RV PACING THRESHOLD AMPLITUDE: 0.75 V
MDC IDC MSMT LEADCHNL RV PACING THRESHOLD PULSEWIDTH: 0.5 ms
MDC IDC PG IMPLANT DT: 20180313
MDC IDC SESS DTM: 20190708192125
MDC IDC SET LEADCHNL RA PACING AMPLITUDE: 2.5 V
MDC IDC SET LEADCHNL RV PACING PULSEWIDTH: 0.5 ms
MDC IDC SET LEADCHNL RV SENSING SENSITIVITY: 4 mV
Pulse Gen Serial Number: 8005625

## 2017-10-07 ENCOUNTER — Ambulatory Visit: Payer: Medicare Other | Admitting: Cardiology

## 2017-10-10 ENCOUNTER — Ambulatory Visit: Payer: Medicare Other | Admitting: Cardiology

## 2017-10-20 NOTE — Progress Notes (Signed)
HPI The patient presents for evaluation of CHB.  She has a past history of nonobstructive coronary disease with a 70% LAD stenosis in 2003. In 2013 she had an echo which was low normal LV function but mild mitral regurgitation. In November of 2013 she had a stress perfusion study with no evidence of ischemia or infarct. She has had some suggestion of diastolic dysfunction on echo. She has had pulmonary function tests which demonstrated normal lung volumes and only a mildly reduced diffusion capacity.  EF on echo in 2015 was mildly reduced at 45 - 50%.  She presented in mid March with fatigue and dyspnea and was found to have heart block.   Because of the previous reduced EF she was given CRT.   In 2018 her ejection fraction was 60-65% with mild mitral regurgitation.   She returns for follow up.  She saw Dr. Rayann Heman last month and her beta blocker reduced secondary to fatigue.  She had been switched from metoprolol to bisoprolol earlier in the year secondary to suspected asthma.   She had normal device function.  I reviewed these notes for this visit.  Today, she feels increasing fatigue and weakness. States yesterday she felt the worse in the last couple of months regarding her shortness of breath and weakness with her activities. She does describe feeling short of breath while sleeping at night where she takes several breaths in and is able to breath normally again. States she barely sleeps from the pain of her frozen shoulder. Reports feeling "off balance" when she moves her head into a different position, she attributes this to her headaches from her mechanical fall where she hit her head 6 weeks ago. Denies chest pain, tightness, or pressure. No leg swelling. No heart palpitations or feeling she is in afib.      Allergies  Allergen Reactions  . Adhesive [Tape] Itching, Dermatitis, Rash and Other (See Comments)    Blisters and "skin bubbles"  . Ace Inhibitors Cough  . Atorvastatin Other (See  Comments)    REACTION: Reaction not known  . Clindamycin Other (See Comments)    Unknown  . Codeine Other (See Comments)    hallucinations   . Latex Other (See Comments)    blisters  . Shellfish Allergy Other (See Comments)    "gallblater attack"  . Simvastatin Other (See Comments)    REACTION: fatigue  . Penicillins Rash    Has patient had a PCN reaction causing immediate rash, facial/tongue/throat swelling, SOB or lightheadedness with hypotension: Unknown Has patient had a PCN reaction causing severe rash involving mucus membranes or skin necrosis: Unknown Has patient had a PCN reaction that required hospitalization: Unknown Has patient had a PCN reaction occurring within the last 10 years: No If all of the above answers are "NO", then may proceed with Cephalosporin use.     Current Outpatient Medications  Medication Sig Dispense Refill  . albuterol (PROVENTIL HFA;VENTOLIN HFA) 108 (90 Base) MCG/ACT inhaler Inhale 1-2 puffs into the lungs every 6 (six) hours as needed for wheezing or shortness of breath. 1 Inhaler 0  . aspirin 81 MG tablet Take 81 mg by mouth daily.    Marland Kitchen BIOTIN PO Take 1 tablet by mouth daily.    . bisoprolol (ZEBETA) 5 MG tablet Take 1 tablet (5 mg total) by mouth daily. 90 tablet 3  . calcium carbonate (TUMS - DOSED IN MG ELEMENTAL CALCIUM) 500 MG chewable tablet Chew 1 tablet by mouth daily as needed  for indigestion or heartburn.    . Cholecalciferol (VITAMIN D3 PO) Take 2 capsules by mouth daily.    . furosemide (LASIX) 40 MG tablet Take 1 tablet (40 mg total) by mouth daily. 30 tablet 11  . Multiple Vitamins-Minerals (CENTRUM SILVER PO) Take 1 tablet by mouth daily.    . Omega-3 Fatty Acids (FISH OIL PO) Take 1 capsule by mouth daily.    . ranitidine (ZANTAC) 150 MG tablet Take 1 tablet (150 mg total) by mouth 2 (two) times daily. 60 tablet 5  . SYNTHROID 88 MCG tablet TAKE 1 TABLET BY MOUTH DAILY BEFORE BREAKFAST 90 tablet 1  . vitamin C (ASCORBIC ACID) 500  MG tablet Take 500 mg by mouth daily.    . metolazone (ZAROXOLYN) 2.5 MG tablet Take 1 tablet (2.5 mg total) by mouth 3 (three) times a week. (Patient taking differently: Take 2.5 mg by mouth 3 (three) times a week. Monday, Wednesday, Friday. Take 30 minutes prior to lasix.) 30 tablet 6   No current facility-administered medications for this visit.     Past Medical History:  Diagnosis Date  . Atrial fibrillation ([place]) 08/16/2016   observed on PPM interrogation, asymptomatic, chad2vasc score is 4.  will consider anticoagulation  . CHF (congestive heart failure) ([place])   . Colon polyps   . Coronary artery disease   . Diverticulosis   . Dyspnea   . GERD (gastroesophageal reflux disease)   . Hearing loss of both ears   . Hepatic cyst   . Hiatal hernia   . Hyperlipidemia   . Hypertension   . Hypothyroidism   . Low back pain   . Mitral valve disorders(424.0)   . Other left bundle branch block   . Palpitations   . URI (upper respiratory infection)     Past Surgical History:  Procedure Laterality Date  . CARDIAC CATHETERIZATION  04-18-01  . COLONOSCOPY    . HERNIA REPAIR    . PACEMAKER IMPLANT Left 05/15/2016   SJM Assirity MRI dual chamber PPM implanted by Dr Rayann Heman for CHB  . THYROIDECTOMY    . TONSILLECTOMY AND ADENOIDECTOMY    . TOTAL ABDOMINAL HYSTERECTOMY W/ BILATERAL SALPINGOOPHORECTOMY      ROS:   As stated in the HPI and positive for headaches, dizziness.    PHYSICAL EXAM BP 122/80 (BP Location: Right Arm, Patient Position: Sitting)   Pulse 68   Ht 5\' 3"  (1.6 m)   Wt 182 lb (82.6 kg)   BMI 32.24 kg/m   GENERAL:  Well appearing NECK:  No jugular venous distention, waveform within normal limits, carotid upstroke brisk and symmetric, no bruits, no thyromegaly LUNGS:  Clear to auscultation bilaterally CHEST:  Unremarkable HEART:  PMI not displaced or sustained,S1 and S2 within normal limits, no S3, no S4, no clicks, no rubs, no murmurs ABD:  Flat, positive bowel  sounds normal in frequency in pitch, no bruits, no rebound, no guarding, no midline pulsatile mass, no hepatomegaly, no splenomegaly EXT:  2 plus pulses throughout, no edema, no cyanosis no clubbing   EKG: Demand AV pacing with PVCs, rate 68.  ASSESSMENT AND PLAN   CHRONIC DIASTOLIC HF:     She is expressing some worsening shortness of breath, though physical exam not consistent with acute exacerbation. This is possibly deconditioning. No further evaluation at this point.    HTN:   The blood pressure is at target. No change in medications is indicated. We will continue with therapeutic lifestyle changes (TLC).  CHB  S/P CRT:    She is up to date with follow up.  She is now taking Bisoprolol 5 mg.    ATRIAL FIB:  No fib noted at last device follow up.  Dr. Rayann Heman comments on less than 1% atrial fib.  Given this and the advanced age we are deciding against anticoagulation.    FATIGUE: I do not believe this is cardiac in nature. She is not sleeping due to pain, this is probably what is attributing to her fatigue.  DIZZINESS: Most likely BPPV, encouraged to see a ENT.

## 2017-10-21 ENCOUNTER — Encounter: Payer: Self-pay | Admitting: Cardiology

## 2017-10-21 ENCOUNTER — Ambulatory Visit: Payer: Medicare Other | Admitting: Cardiology

## 2017-10-21 VITALS — BP 122/80 | HR 68 | Ht 63.0 in | Wt 182.0 lb

## 2017-10-21 DIAGNOSIS — I5032 Chronic diastolic (congestive) heart failure: Secondary | ICD-10-CM | POA: Diagnosis not present

## 2017-10-21 DIAGNOSIS — R5383 Other fatigue: Secondary | ICD-10-CM | POA: Diagnosis not present

## 2017-10-21 NOTE — Patient Instructions (Signed)

## 2017-10-25 ENCOUNTER — Telehealth: Payer: Self-pay | Admitting: Cardiology

## 2017-10-25 NOTE — Telephone Encounter (Signed)
° ° °  1) Are you calling to confirm a diagnosis or obtain personal health information (Y/N)? YES  2) If so, what information is requested? EF%, vitals  Evelena Peat at [facility] calling to request diagnosis to confirm Heart Failure. Fax [phone] Phone [phone] ext [phone]  Please route to Medical Records or your medical records site representative

## 2017-11-05 NOTE — Telephone Encounter (Signed)
Spoke with Beverly Kim and confirmed HF diagnosis, provided las EF and BP/HR.

## 2017-11-14 ENCOUNTER — Encounter: Payer: Medicare Other | Admitting: *Deleted

## 2017-11-14 ENCOUNTER — Telehealth: Payer: Self-pay

## 2017-11-14 NOTE — Telephone Encounter (Signed)
LMOVM reminding pt to send remote transmission.   

## 2017-11-15 ENCOUNTER — Encounter: Payer: Self-pay | Admitting: Cardiology

## 2017-11-20 ENCOUNTER — Other Ambulatory Visit: Payer: Self-pay | Admitting: Internal Medicine

## 2017-11-27 ENCOUNTER — Other Ambulatory Visit: Payer: Self-pay

## 2017-11-27 ENCOUNTER — Other Ambulatory Visit: Payer: Self-pay | Admitting: Internal Medicine

## 2017-11-27 MED ORDER — SYNTHROID 88 MCG PO TABS: ORAL_TABLET | ORAL | 1 refills | Status: DC

## 2018-01-07 ENCOUNTER — Other Ambulatory Visit: Payer: Self-pay | Admitting: Internal Medicine

## 2018-01-08 ENCOUNTER — Other Ambulatory Visit: Payer: Self-pay | Admitting: Internal Medicine

## 2018-01-24 ENCOUNTER — Ambulatory Visit: Payer: Medicare Other | Admitting: Internal Medicine

## 2018-01-28 ENCOUNTER — Encounter: Payer: Self-pay | Admitting: Cardiology

## 2018-02-03 DIAGNOSIS — H04123 Dry eye syndrome of bilateral lacrimal glands: Secondary | ICD-10-CM | POA: Diagnosis not present

## 2018-02-03 DIAGNOSIS — H26491 Other secondary cataract, right eye: Secondary | ICD-10-CM | POA: Diagnosis not present

## 2018-02-03 DIAGNOSIS — H52203 Unspecified astigmatism, bilateral: Secondary | ICD-10-CM | POA: Diagnosis not present

## 2018-02-03 DIAGNOSIS — H43813 Vitreous degeneration, bilateral: Secondary | ICD-10-CM | POA: Diagnosis not present

## 2018-02-13 ENCOUNTER — Ambulatory Visit (INDEPENDENT_AMBULATORY_CARE_PROVIDER_SITE_OTHER): Payer: Medicare Other

## 2018-02-13 DIAGNOSIS — I442 Atrioventricular block, complete: Secondary | ICD-10-CM | POA: Diagnosis not present

## 2018-02-14 ENCOUNTER — Encounter: Payer: Self-pay | Admitting: Cardiology

## 2018-02-14 NOTE — Progress Notes (Signed)
Remote pacemaker transmission.   

## 2018-03-01 ENCOUNTER — Other Ambulatory Visit: Payer: Self-pay | Admitting: Cardiology

## 2018-03-30 LAB — CUP PACEART REMOTE DEVICE CHECK
Battery Remaining Longevity: 116 mo
Battery Remaining Percentage: 95.5 %
Battery Voltage: 2.99 V
Brady Statistic RA Percent Paced: 40 %
Brady Statistic RV Percent Paced: 97 %
Date Time Interrogation Session: 20191212072055
Implantable Lead Implant Date: 20180313
Implantable Lead Implant Date: 20180313
Implantable Lead Location: 753860
Implantable Pulse Generator Implant Date: 20180313
Lead Channel Impedance Value: 540 Ohm
Lead Channel Pacing Threshold Amplitude: 1.125 V
Lead Channel Sensing Intrinsic Amplitude: 3.8 mV
Lead Channel Setting Sensing Sensitivity: 4 mV
MDC IDC LEAD LOCATION: 753859
MDC IDC MSMT LEADCHNL RA IMPEDANCE VALUE: 400 Ohm
MDC IDC MSMT LEADCHNL RA PACING THRESHOLD PULSEWIDTH: 0.5 ms
MDC IDC MSMT LEADCHNL RV PACING THRESHOLD AMPLITUDE: 0.875 V
MDC IDC MSMT LEADCHNL RV PACING THRESHOLD PULSEWIDTH: 0.5 ms
MDC IDC MSMT LEADCHNL RV SENSING INTR AMPL: 12 mV
MDC IDC SET LEADCHNL RA PACING AMPLITUDE: 2.125
MDC IDC SET LEADCHNL RV PACING AMPLITUDE: 1.125
MDC IDC SET LEADCHNL RV PACING PULSEWIDTH: 0.5 ms
MDC IDC STAT BRADY AP VP PERCENT: 42 %
MDC IDC STAT BRADY AP VS PERCENT: 1 %
MDC IDC STAT BRADY AS VP PERCENT: 55 %
MDC IDC STAT BRADY AS VS PERCENT: 1 %
Pulse Gen Model: 2272
Pulse Gen Serial Number: 8005625

## 2018-05-15 ENCOUNTER — Ambulatory Visit (INDEPENDENT_AMBULATORY_CARE_PROVIDER_SITE_OTHER): Payer: PPO | Admitting: *Deleted

## 2018-05-15 DIAGNOSIS — I442 Atrioventricular block, complete: Secondary | ICD-10-CM | POA: Diagnosis not present

## 2018-05-16 LAB — CUP PACEART REMOTE DEVICE CHECK
Battery Remaining Longevity: 115 mo
Brady Statistic AP VP Percent: 53 %
Brady Statistic AP VS Percent: 1 %
Brady Statistic AS VP Percent: 44 %
Brady Statistic RA Percent Paced: 51 %
Brady Statistic RV Percent Paced: 98 %
Date Time Interrogation Session: 20200312080439
Implantable Lead Implant Date: 20180313
Implantable Lead Location: 753859
Lead Channel Impedance Value: 390 Ohm
Lead Channel Impedance Value: 480 Ohm
Lead Channel Pacing Threshold Amplitude: 0.75 V
Lead Channel Pacing Threshold Amplitude: 1 V
Lead Channel Pacing Threshold Pulse Width: 0.5 ms
Lead Channel Sensing Intrinsic Amplitude: 12 mV
Lead Channel Setting Pacing Amplitude: 2 V
Lead Channel Setting Pacing Pulse Width: 0.5 ms
MDC IDC LEAD IMPLANT DT: 20180313
MDC IDC LEAD LOCATION: 753860
MDC IDC MSMT BATTERY REMAINING PERCENTAGE: 95.5 %
MDC IDC MSMT BATTERY VOLTAGE: 2.99 V
MDC IDC MSMT LEADCHNL RA SENSING INTR AMPL: 2 mV
MDC IDC MSMT LEADCHNL RV PACING THRESHOLD PULSEWIDTH: 0.5 ms
MDC IDC PG IMPLANT DT: 20180313
MDC IDC SET LEADCHNL RV PACING AMPLITUDE: 1 V
MDC IDC SET LEADCHNL RV SENSING SENSITIVITY: 4 mV
MDC IDC STAT BRADY AS VS PERCENT: 1 %
Pulse Gen Model: 2272
Pulse Gen Serial Number: 8005625

## 2018-05-22 ENCOUNTER — Encounter: Payer: Self-pay | Admitting: Cardiology

## 2018-05-22 NOTE — Progress Notes (Signed)
Remote pacemaker transmission.   

## 2018-06-02 ENCOUNTER — Other Ambulatory Visit: Payer: Self-pay | Admitting: Internal Medicine

## 2018-06-02 ENCOUNTER — Other Ambulatory Visit: Payer: Self-pay | Admitting: Cardiology

## 2018-07-14 ENCOUNTER — Other Ambulatory Visit: Payer: Self-pay | Admitting: Internal Medicine

## 2018-07-15 NOTE — Telephone Encounter (Signed)
Pt aware of response and expressed understanding.  

## 2018-07-15 NOTE — Telephone Encounter (Signed)
Last time I saw her was may of last year - her BP was good, but that was a while ago.  I do not want to change the dose w/o knowing what her BP has been running.    Ok to fill for 90 days.   Ok to push appt out a little.

## 2018-07-15 NOTE — Telephone Encounter (Signed)
I called pt to let her know she is overdue for a follow up. I set her up an appointment for 6/3. She wanted it pushed out. She asked about her bisoprolol dosage. She states that it was talked about to decrease her dosage from 10 mg to 5 mg. I wanted to make sure that was ok to fill. She also wanted a 90 day supply which I wanted to ask if that was ok with you as well.

## 2018-08-05 NOTE — Progress Notes (Signed)
Subjective:    Patient ID: Beverly Kim, female    DOB: 10/16/1924, 85 y.o.   MRN: [id number]  HPI The patient is here for follow up.  She is not exercising regularly.    Shortness of breath: This morning she felt sob and had gained 2 lbs over night.  She took the zaroxolyn and she has lost the two lbs since this morning.  She  usually only takes the zaroxolyn once a week  She is compliant with low sodium diet.  She last took zaroxolyn Saturday.  She denies any leg swelling.  At this point she feels normal-she denies any shortness of breath.  Hypertension: She is taking her medication daily.  She states she does not feel well when she takes the bisoprolol.  She states she is currently taking 10 mg daily, but should be taking 5 mg daily but the pharmacy would not give her that prescription.  She is compliant with a low sodium diet.  She denies chest pain, palpitations, edema,  and regular headaches.     Hyperlipidemia: She is taking her medication daily. She is compliant with a low fat/cholesterol diet.     Hypothyroidism:  She is taking her medication daily.  She denies any recent changes in energy or weight that are unexplained.   Prediabetes:  She is compliant with a low sugar/carbohydrate diet.  She is not exercising regularly.  GERD:  She is taking her medication daily as prescribed.  She denies any GERD symptoms and feels her GERD is well controlled.      Medications and allergies reviewed with patient and updated if appropriate.  Patient Active Problem List   Diagnosis Date Noted  . Calculus of gallbladder without cholecystitis without obstruction 07/26/2017  . Pyelonephritis 07/25/2017  . Carotid disease, bilateral ([place]) 04/08/2017  . Leg pain 04/08/2017  . Non-rheumatic mitral regurgitation 07/12/2016  . Complete heart block ([place]) 05/14/2016  . Prediabetes 01/05/2016  . Degenerative disc disease, cervical 08/23/2015  . Degenerative disc disease, lumbar 08/23/2015   . Left rotator cuff tear arthropathy 07/12/2015  . Left shoulder pain 04/06/2015  . Frozen shoulder 01/03/2015  . Cough variant asthma 12/08/2013  . Upper airway cough syndrome 10/27/2013  . Dyspnea 02/03/2013  . GERD 07/06/2009  . HIATAL HERNIA 07/06/2009  . DIVERTICULOSIS, COLON 07/06/2009  . HEPATIC CYST 07/06/2009  . COLONIC POLYPS, HX OF 07/06/2009  . MIXED HEARING LOSS BILATERAL 12/13/2008  . Essential hypertension 06/19/2008  . MITRAL VALVE PROLAPSE 06/19/2008  . LBBB (left bundle branch block) 06/19/2008  . Hyperlipidemia 08/12/2007  . Hypothyroidism 12/02/2006  . CAD (coronary artery disease) 12/02/2006  . Acute on chronic diastolic heart failure ([place]) 12/02/2006    Current Outpatient Medications on File Prior to Visit  Medication Sig Dispense Refill  . albuterol (PROVENTIL HFA;VENTOLIN HFA) 108 (90 Base) MCG/ACT inhaler Inhale 1-2 puffs into the lungs every 6 (six) hours as needed for wheezing or shortness of breath. 1 Inhaler 0  . aspirin 81 MG tablet Take 81 mg by mouth daily.    Marland Kitchen BIOTIN PO Take 1 tablet by mouth daily.    . bisoprolol (ZEBETA) 10 MG tablet Take 1 tablet by mouth once daily 90 tablet 0  . bisoprolol (ZEBETA) 5 MG tablet Take 1 tablet (5 mg total) by mouth daily. 90 tablet 3  . calcium carbonate (TUMS - DOSED IN MG ELEMENTAL CALCIUM) 500 MG chewable tablet Chew 1 tablet by mouth daily as needed for indigestion or heartburn.    Marland Kitchen  Cholecalciferol (VITAMIN D3 PO) Take 2 capsules by mouth daily.    . furosemide (LASIX) 40 MG tablet TAKE 1 TABLET BY MOUTH ONCE DAILY 30 tablet 6  . metolazone (ZAROXOLYN) 2.5 MG tablet TAKE ONE TABLET BY MOUTH THREE TIMES A WEEK 30 tablet 5  . Multiple Vitamins-Minerals (CENTRUM SILVER PO) Take 1 tablet by mouth daily.    . Omega-3 Fatty Acids (FISH OIL PO) Take 1 capsule by mouth daily.    . ranitidine (ZANTAC) 150 MG tablet Take 1 tablet (150 mg total) by mouth 2 (two) times daily. 60 tablet 5  . SYNTHROID 88 MCG tablet  TAKE 1 TABLET BY MOUTH ONCE DAILY BEFORE BREAKFAST 90 tablet 0  . vitamin C (ASCORBIC ACID) 500 MG tablet Take 500 mg by mouth daily.     No current facility-administered medications on file prior to visit.     Past Medical History:  Diagnosis Date  . Atrial fibrillation ([place]) 08/16/2016   observed on PPM interrogation, asymptomatic, chad2vasc score is 4.  will consider anticoagulation  . CHF (congestive heart failure) ([place])   . Colon polyps   . Coronary artery disease   . Diverticulosis   . Dyspnea   . GERD (gastroesophageal reflux disease)   . Hearing loss of both ears   . Hepatic cyst   . Hiatal hernia   . Hyperlipidemia   . Hypertension   . Hypothyroidism   . Low back pain   . Mitral valve disorders(424.0)   . Other left bundle branch block   . Palpitations   . URI (upper respiratory infection)     Past Surgical History:  Procedure Laterality Date  . CARDIAC CATHETERIZATION  04-18-01  . COLONOSCOPY    . HERNIA REPAIR    . PACEMAKER IMPLANT Left 05/15/2016   SJM Assirity MRI dual chamber PPM implanted by Dr Rayann Heman for CHB  . THYROIDECTOMY    . TONSILLECTOMY AND ADENOIDECTOMY    . TOTAL ABDOMINAL HYSTERECTOMY W/ BILATERAL SALPINGOOPHORECTOMY      Social History   Socioeconomic History  . Marital status: Widowed    Spouse name: Not on file  . Number of children: 4  . Years of education: Not on file  . Highest education level: Not on file  Occupational History  . Occupation: retired  Scientific laboratory technician  . Financial resource strain: Not hard at all  . Food insecurity:    Worry: Never true    Inability: Never true  . Transportation needs:    Medical: No    Non-medical: No  Tobacco Use  . Smoking status: Never Smoker  . Smokeless tobacco: Never Used  Substance and Sexual Activity  . Alcohol use: No  . Drug use: No  . Sexual activity: Never  Lifestyle  . Physical activity:    Days per week: 0 days    Minutes per session: 0 min  . Stress: Not at all   Relationships  . Social connections:    Talks on phone: More than three times a week    Gets together: More than three times a week    Attends religious service: More than 4 times per year    Active member of club or organization: Yes    Attends meetings of clubs or organizations: More than 4 times per year    Relationship status: Widowed  Other Topics Concern  . Not on file  Social History Narrative  . Not on file    Family History  Problem Relation Age of  Onset  . Coronary artery disease Unknown        family hx of 1st degree relative <50  . Diabetes Unknown        family hx of  . Other Unknown        cardiovascular disorder family hx of  . Other Unknown        neurological disorder family hx of  . Other Unknown        respiratory disease family hx of  . Heart attack Daughter   . Heart Problems Unknown        all children  . Sudden death Son   . Heart disease Brother   . Heart disease Sister   . Colon cancer Neg Hx   . Colon polyps Neg Hx     Review of Systems  Constitutional: Negative for chills and fever.  Respiratory: Positive for shortness of breath (this morning, no SOB now). Negative for cough and wheezing.   Cardiovascular: Negative for chest pain, palpitations and leg swelling.  Neurological: Positive for headaches (occ migraine). Negative for light-headedness.       Objective:   Vitals:   08/06/18 1346  BP: 138/68  Pulse: 68  Resp: 18  Temp: 98.4 F (36.9 C)  SpO2: 96%   BP Readings from Last 3 Encounters:  08/06/18 138/68  10/21/17 122/80  09/09/17 132/76   Wt Readings from Last 3 Encounters:  08/06/18 178 lb 12.8 oz (81.1 kg)  10/21/17 182 lb (82.6 kg)  09/09/17 185 lb (83.9 kg)   Body mass index is 31.67 kg/m.   Physical Exam    Constitutional: Appears well-developed and well-nourished. No distress.  HENT:  Head: Normocephalic and atraumatic.  Neck: Neck supple. No tracheal deviation present. No thyromegaly present.  No cervical  lymphadenopathy Cardiovascular: Normal rate, regular rhythm and normal heart sounds.   No murmur heard. No carotid bruit .  No edema Pulmonary/Chest: Effort normal and breath sounds normal. No respiratory distress. No has no wheezes. No rales.  Skin: Skin is warm and dry. Not diaphoretic.  Psychiatric: Normal mood and affect. Behavior is normal.      Assessment & Plan:    See Problem List for Assessment and Plan of chronic medical problems.

## 2018-08-06 ENCOUNTER — Encounter: Payer: Self-pay | Admitting: Internal Medicine

## 2018-08-06 ENCOUNTER — Other Ambulatory Visit (INDEPENDENT_AMBULATORY_CARE_PROVIDER_SITE_OTHER): Payer: PPO

## 2018-08-06 ENCOUNTER — Ambulatory Visit (INDEPENDENT_AMBULATORY_CARE_PROVIDER_SITE_OTHER): Payer: PPO | Admitting: Internal Medicine

## 2018-08-06 ENCOUNTER — Other Ambulatory Visit: Payer: Self-pay

## 2018-08-06 VITALS — BP 138/68 | HR 68 | Temp 98.4°F | Resp 18 | Ht 63.0 in | Wt 178.8 lb

## 2018-08-06 DIAGNOSIS — I10 Essential (primary) hypertension: Secondary | ICD-10-CM

## 2018-08-06 DIAGNOSIS — E782 Mixed hyperlipidemia: Secondary | ICD-10-CM

## 2018-08-06 DIAGNOSIS — K219 Gastro-esophageal reflux disease without esophagitis: Secondary | ICD-10-CM | POA: Diagnosis not present

## 2018-08-06 DIAGNOSIS — I5033 Acute on chronic diastolic (congestive) heart failure: Secondary | ICD-10-CM | POA: Diagnosis not present

## 2018-08-06 DIAGNOSIS — R7303 Prediabetes: Secondary | ICD-10-CM

## 2018-08-06 DIAGNOSIS — E038 Other specified hypothyroidism: Secondary | ICD-10-CM

## 2018-08-06 LAB — TSH: TSH: 0.94 u[IU]/mL (ref 0.35–4.50)

## 2018-08-06 LAB — CBC WITH DIFFERENTIAL/PLATELET
Basophils Absolute: 0.1 10*3/uL (ref 0.0–0.1)
Basophils Relative: 0.7 % (ref 0.0–3.0)
Eosinophils Absolute: 0.2 10*3/uL (ref 0.0–0.7)
Eosinophils Relative: 2.9 % (ref 0.0–5.0)
HCT: 41 % (ref 36.0–46.0)
Hemoglobin: 14 g/dL (ref 12.0–15.0)
Lymphocytes Relative: 29.5 % (ref 12.0–46.0)
Lymphs Abs: 2.5 10*3/uL (ref 0.7–4.0)
MCHC: 34.2 g/dL (ref 30.0–36.0)
MCV: 93.7 fl (ref 78.0–100.0)
Monocytes Absolute: 0.7 10*3/uL (ref 0.1–1.0)
Monocytes Relative: 8.4 % (ref 3.0–12.0)
Neutro Abs: 5 10*3/uL (ref 1.4–7.7)
Neutrophils Relative %: 58.5 % (ref 43.0–77.0)
Platelets: 205 10*3/uL (ref 150.0–400.0)
RBC: 4.37 Mil/uL (ref 3.87–5.11)
RDW: 14.2 % (ref 11.5–15.5)
WBC: 8.5 10*3/uL (ref 4.0–10.5)

## 2018-08-06 LAB — LIPID PANEL
Cholesterol: 205 mg/dL — ABNORMAL HIGH (ref 0–200)
HDL: 41.7 mg/dL (ref >39.00)
LDL Cholesterol: 132 mg/dL — ABNORMAL HIGH (ref 0–99)
NonHDL: 163.26
Total CHOL/HDL Ratio: 5
Triglycerides: 156 mg/dL — ABNORMAL HIGH (ref 0.0–149.0)
VLDL: 31.2 mg/dL (ref 0.0–40.0)

## 2018-08-06 LAB — COMPREHENSIVE METABOLIC PANEL
ALT: 15 U/L (ref 0–35)
AST: 17 U/L (ref 0–37)
Albumin: 4.4 g/dL (ref 3.5–5.2)
Alkaline Phosphatase: 59 U/L (ref 39–117)
BUN: 24 mg/dL — ABNORMAL HIGH (ref 6–23)
CO2: 30 mEq/L (ref 19–32)
Calcium: 10.2 mg/dL (ref 8.4–10.5)
Chloride: 99 mEq/L (ref 96–112)
Creatinine, Ser: 1.06 mg/dL (ref 0.40–1.20)
GFR: 48.5 mL/min — ABNORMAL LOW (ref >60.00)
Glucose, Bld: 116 mg/dL — ABNORMAL HIGH (ref 70–99)
Potassium: 3.8 mEq/L (ref 3.5–5.1)
Sodium: 139 mEq/L (ref 135–145)
Total Bilirubin: 0.8 mg/dL (ref 0.2–1.2)
Total Protein: 7.6 g/dL (ref 6.0–8.3)

## 2018-08-06 LAB — HEMOGLOBIN A1C: Hgb A1c MFr Bld: 6.7 % — ABNORMAL HIGH (ref 4.6–6.5)

## 2018-08-06 LAB — BRAIN NATRIURETIC PEPTIDE: Pro B Natriuretic peptide (BNP): 356 pg/mL — ABNORMAL HIGH (ref 0.0–100.0)

## 2018-08-06 MED ORDER — BISOPROLOL FUMARATE 5 MG PO TABS: 5.0000 mg | ORAL_TABLET | Freq: Every day | ORAL | 3 refills | Status: DC

## 2018-08-06 MED ORDER — SYNTHROID 88 MCG PO TABS: ORAL_TABLET | ORAL | 1 refills | Status: DC

## 2018-08-06 NOTE — Patient Instructions (Addendum)
  Tests ordered today. Your results will be released to [place] (or called to you) after review, usually within 72hours after test completion. If any changes need to be made, you will be notified at that same time.   Medications reviewed and updated.  Changes include :   Decrease bystolic to 5 mg daily - take two pills if your blood pressure is high - let me know if your blood pressure is not controlled.   Your prescription(s) have been submitted to your pharmacy. Please take as directed and contact our office if you believe you are having problem(s) with the medication(s).   Please followup in 6 months

## 2018-08-06 NOTE — Assessment & Plan Note (Signed)
Blood pressure looks good today-she states she does not feel very well on the bisoprolol-she felt better on Lopressor, which was changed by Dr. Melvyn Novas over concerns for her suspected asthma Will try decreasing her to 5 mg daily and she will monitor her blood pressure-can take 10 mg if needed and advised her to let us know if her blood pressure is elevated

## 2018-08-06 NOTE — Assessment & Plan Note (Signed)
Clinically euthyroid Check tsh  Titrate med dose if needed  

## 2018-08-06 NOTE — Assessment & Plan Note (Signed)
Check a1c Low sugar / carb diet Encouraged increased activity/regular exercise

## 2018-08-06 NOTE — Assessment & Plan Note (Addendum)
She had shortness of breath this morning when she woke up and had gained 2 pounds over night Takes the Zaroxolyn once a week on Saturdays and she did take it last Saturday, 4 days ago She took 1 Zaroxolyn this morning and she states she feels much better and no longer has shortness of breath.  She is also down 2 pounds since this morning Since she is getting blood work will check CMP, BNP Advised her to take the Zaroxolyn as needed and may occasionally need twice a week Lungs are clear and no lower extremity edema on exam Continue compliance with a low-sodium diet

## 2018-08-06 NOTE — Assessment & Plan Note (Signed)
Check lipid panel Has not tolerated statins and at this age would not prescribe

## 2018-08-06 NOTE — Assessment & Plan Note (Signed)
GERD controlled Continue daily medication  

## 2018-08-14 ENCOUNTER — Ambulatory Visit (INDEPENDENT_AMBULATORY_CARE_PROVIDER_SITE_OTHER): Payer: PPO | Admitting: *Deleted

## 2018-08-14 DIAGNOSIS — I442 Atrioventricular block, complete: Secondary | ICD-10-CM

## 2018-08-14 LAB — CUP PACEART REMOTE DEVICE CHECK
Battery Remaining Longevity: 115 mo
Battery Remaining Percentage: 95.5 %
Battery Voltage: 2.99 V
Brady Statistic AP VP Percent: 57 %
Brady Statistic AP VS Percent: 1 %
Brady Statistic AS VP Percent: 40 %
Brady Statistic AS VS Percent: 1 %
Brady Statistic RA Percent Paced: 55 %
Brady Statistic RV Percent Paced: 98 %
Date Time Interrogation Session: 20200611064815
Implantable Lead Implant Date: 20180313
Implantable Lead Implant Date: 20180313
Implantable Lead Location: 753859
Implantable Lead Location: 753860
Implantable Pulse Generator Implant Date: 20180313
Lead Channel Impedance Value: 400 Ohm
Lead Channel Impedance Value: 480 Ohm
Lead Channel Pacing Threshold Amplitude: 0.75 V
Lead Channel Pacing Threshold Amplitude: 1 V
Lead Channel Pacing Threshold Pulse Width: 0.5 ms
Lead Channel Pacing Threshold Pulse Width: 0.5 ms
Lead Channel Sensing Intrinsic Amplitude: 12 mV
Lead Channel Sensing Intrinsic Amplitude: 2.3 mV
Lead Channel Setting Pacing Amplitude: 1 V
Lead Channel Setting Pacing Amplitude: 2 V
Lead Channel Setting Pacing Pulse Width: 0.5 ms
Lead Channel Setting Sensing Sensitivity: 4 mV
Pulse Gen Model: 2272
Pulse Gen Serial Number: 8005625

## 2018-08-18 DIAGNOSIS — M542 Cervicalgia: Secondary | ICD-10-CM | POA: Diagnosis not present

## 2018-08-18 DIAGNOSIS — M25512 Pain in left shoulder: Secondary | ICD-10-CM | POA: Diagnosis not present

## 2018-08-18 DIAGNOSIS — S161XXA Strain of muscle, fascia and tendon at neck level, initial encounter: Secondary | ICD-10-CM | POA: Diagnosis not present

## 2018-08-19 ENCOUNTER — Ambulatory Visit (INDEPENDENT_AMBULATORY_CARE_PROVIDER_SITE_OTHER): Payer: PPO | Admitting: *Deleted

## 2018-08-19 ENCOUNTER — Telehealth: Payer: Self-pay | Admitting: *Deleted

## 2018-08-19 DIAGNOSIS — Z0000 Encounter for general adult medical examination without abnormal findings: Secondary | ICD-10-CM

## 2018-08-19 NOTE — Progress Notes (Addendum)
Subjective:   Beverly Kim is a 85 y.o. female who presents for Medicare Annual (Subsequent) preventive examination. I connected with patient by a telephone and verified that I am speaking with the correct person using two identifiers. Patient stated full name and DOB. Patient gave permission to continue with telephonic visit. Patient's location was at home and Nurse's location was at [place] office.   Review of Systems:   Cardiac Risk Factors include: advanced age (>76men, >59 women);dyslipidemia;diabetes mellitus;hypertension Sleep patterns: gets up 1-2 times nightly to void and sleeps 6-7 hours nightly.    Home Safety/Smoke Alarms: Feels safe in home. Smoke alarms in place.  Living environment; residence and Firearm Safety: 1-story house/ trailer, equipment: Radio producer, Type: [place] and Walkers, Type: Conservation officer, nature. Seat Belt Safety/Bike Helmet: Wears seat belt.      Objective:     Vitals: There were no vitals taken for this visit.  There is no height or weight on file to calculate BMI.  Advanced Directives 08/19/2018 07/19/2017 03/05/2017 01/15/2017 05/14/2016  Does Patient Have a Medical Advance Directive? No No No No No  Does patient want to make changes to medical advance directive? - No - Patient declined - - -  Would patient like information on creating a medical advance directive? No - Patient declined - - No - Patient declined No - Patient declined    Tobacco Social History   Tobacco Use  Smoking Status Never Smoker  Smokeless Tobacco Never Used     Counseling given: Not Answered  Past Medical History:  Diagnosis Date  . Atrial fibrillation ([facility]) 08/16/2016   observed on PPM interrogation, asymptomatic, chad2vasc score is 4.  will consider anticoagulation  . CHF (congestive heart failure) ([place])   . Colon polyps   . Coronary artery disease   . Diverticulosis   . Dyspnea   . GERD (gastroesophageal reflux disease)   . Hearing loss of both ears   . Hepatic  cyst   . Hiatal hernia   . Hyperlipidemia   . Hypertension   . Hypothyroidism   . Low back pain   . Mitral valve disorders(424.0)   . Other left bundle branch block   . Palpitations   . URI (upper respiratory infection)    Past Surgical History:  Procedure Laterality Date  . CARDIAC CATHETERIZATION  04-18-01  . COLONOSCOPY    . HERNIA REPAIR    . PACEMAKER IMPLANT Left 05/15/2016   SJM Assirity MRI dual chamber PPM implanted by Dr Rayann Heman for CHB  . THYROIDECTOMY    . TONSILLECTOMY AND ADENOIDECTOMY    . TOTAL ABDOMINAL HYSTERECTOMY W/ BILATERAL SALPINGOOPHORECTOMY     Family History  Problem Relation Age of Onset  . Coronary artery disease Other        family hx of 1st degree relative <50  . Diabetes Other        family hx of  . Other Other        cardiovascular disorder family hx of  . Other Other        neurological disorder family hx of  . Other Other        respiratory disease family hx of  . Heart attack Daughter   . Heart Problems Other        all children  . Sudden death Son   . Heart disease Brother   . Heart disease Sister   . Colon cancer Neg Hx   . Colon polyps Neg Hx  Social History   Socioeconomic History  . Marital status: Widowed    Spouse name: Not on file  . Number of children: 4  . Years of education: Not on file  . Highest education level: Not on file  Occupational History  . Occupation: retired  Scientific laboratory technician  . Financial resource strain: Not hard at all  . Food insecurity    Worry: Never true    Inability: Never true  . Transportation needs    Medical: No    Non-medical: No  Tobacco Use  . Smoking status: Never Smoker  . Smokeless tobacco: Never Used  Substance and Sexual Activity  . Alcohol use: No  . Drug use: No  . Sexual activity: Never  Lifestyle  . Physical activity    Days per week: 5 days    Minutes per session: 40 min  . Stress: Not at all  Relationships  . Social connections    Talks on phone: More than three  times a week    Gets together: More than three times a week    Attends religious service: More than 4 times per year    Active member of club or organization: Yes    Attends meetings of clubs or organizations: More than 4 times per year    Relationship status: Widowed  Other Topics Concern  . Not on file  Social History Narrative  . Not on file    Outpatient Encounter Medications as of 08/19/2018  Medication Sig  . albuterol (PROVENTIL HFA;VENTOLIN HFA) 108 (90 Base) MCG/ACT inhaler Inhale 1-2 puffs into the lungs every 6 (six) hours as needed for wheezing or shortness of breath.  Marland Kitchen aspirin 81 MG tablet Take 81 mg by mouth daily.  Marland Kitchen BIOTIN PO Take 1 tablet by mouth daily.  . bisoprolol (ZEBETA) 5 MG tablet Take 1 tablet (5 mg total) by mouth daily.  . calcium carbonate (TUMS - DOSED IN MG ELEMENTAL CALCIUM) 500 MG chewable tablet Chew 1 tablet by mouth daily as needed for indigestion or heartburn.  . Cholecalciferol (VITAMIN D3 PO) Take 2 capsules by mouth daily.  . furosemide (LASIX) 40 MG tablet TAKE 1 TABLET BY MOUTH ONCE DAILY  . HYDROcodone-acetaminophen (NORCO/VICODIN) 5-325 MG tablet Take 1 tablet by mouth every 6 (six) hours as needed for moderate pain.  . metolazone (ZAROXOLYN) 2.5 MG tablet TAKE ONE TABLET BY MOUTH THREE TIMES A WEEK  . Multiple Vitamins-Minerals (CENTRUM SILVER PO) Take 1 tablet by mouth daily.  . Omega-3 Fatty Acids (FISH OIL PO) Take 1 capsule by mouth daily.  Marland Kitchen SYNTHROID 88 MCG tablet TAKE 1 TABLET BY MOUTH ONCE DAILY BEFORE BREAKFAST  . vitamin C (ASCORBIC ACID) 500 MG tablet Take 500 mg by mouth daily.  . [DISCONTINUED] ranitidine (ZANTAC) 150 MG tablet Take 1 tablet (150 mg total) by mouth 2 (two) times daily. (Patient not taking: Reported on 08/19/2018)   No facility-administered encounter medications on file as of 08/19/2018.     Activities of Daily Living In your present state of health, do you have any difficulty performing the following activities:  08/19/2018  Hearing? Y  Vision? N  Difficulty concentrating or making decisions? N  Walking or climbing stairs? N  Dressing or bathing? N  Doing errands, shopping? N  Preparing Food and eating ? N  Using the Toilet? N  In the past six months, have you accidently leaked urine? N  Do you have problems with loss of bowel control? N  Managing your Medications?  N  Managing your Finances? N  Housekeeping or managing your Housekeeping? N  Some recent data might be hidden    Patient Care Team: [name], MD as PCP - General (Internal Medicine) Minus Breeding, MD as Consulting Physician (Cardiology) Tanda Rockers, MD as Consulting Physician (Pulmonary Disease)    Assessment:   This is a routine wellness examination for Beverly Kim.  Exercise Activities and Dietary recommendations Current Exercise Habits: Home exercise routine, Type of exercise: stretching(does streching exercises every morning in bed), Time (Minutes): 40, Frequency (Times/Week): 6, Weekly Exercise (Minutes/Week): 240, Intensity: Mild, Exercise limited by: orthopedic condition(s)  Diet (meal preparation, eat out, water intake, caffeinated beverages, dairy products, fruits and vegetables): in general, a "healthy" diet  , well balanced  Reviewed heart healthy and diabetic diet. Encouraged patient to increase daily water and healthy fluid intake. Relevant patient education assigned to patient using Emmi.  Goals    . Patient Stated     Stay as healthy and as independent possible. Worship God, enjoy life and family.       Fall Risk Fall Risk  08/19/2018 07/19/2017 01/15/2017 03/16/2016 01/03/2015  Falls in the past year? 1 No No No Yes  Number falls in past yr: 1 - - - 1  Injury with Fall? 1 - - - No  Risk for fall due to : Impaired vision Impaired balance/gait;Impaired mobility - - -  Follow up Education provided;Falls prevention discussed - - - -    Depression Screen PHQ 2/9 Scores 08/19/2018 07/19/2017 01/15/2017  03/16/2016  PHQ - 2 Score 0 0 0 0  PHQ- 9 Score - 3 - -     Cognitive Function MMSE - Mini Mental State Exam 07/19/2017 01/15/2017  Orientation to time 5 5  Orientation to Place 5 5  Registration 3 3  Attention/ Calculation 4 4  Recall 1 1  Language- name 2 objects 2 2  Language- repeat 1 1  Language- follow 3 step command 3 3  Language- read & follow direction 1 1  Write a sentence 1 1  Copy design 1 1  Total score 27 27       Ad8 score reviewed for issues:  Issues making decisions: no  Less interest in hobbies / activities: no  Repeats questions, stories (family complaining): no  Trouble using ordinary gadgets (microwave, computer, phone):no  Forgets the month or year: no  Mismanaging finances: no  Remembering appts: no  Daily problems with thinking and/or memory: no Ad8 score is= 0  Immunization History  Administered Date(s) Administered  . Influenza Split 12/25/2010, 12/25/2011  . Influenza Whole 03/05/2005, 12/13/2008  . Influenza, High Dose Seasonal PF 12/16/2012, 01/15/2017  . Pneumococcal Polysaccharide-23 03/05/2002, 08/12/2007  . Td 03/06/2003  . Zoster 08/12/2007   Screening Tests Health Maintenance  Topic Date Due  . FOOT EXAM  09/27/1936  . OPHTHALMOLOGY EXAM  09/27/1936  . URINE MICROALBUMIN  09/27/1936  . DEXA SCAN  09/28/1991  . TETANUS/TDAP  03/05/2013  . PNA vac Low Risk Adult (2 of 2 - PCV13) 08/06/2019 (Originally 08/11/2008)  . INFLUENZA VACCINE  10/04/2018  . HEMOGLOBIN A1C  02/05/2019      Plan:     I have personally reviewed and noted the following in the patient's chart:   . Medical and social history . Use of alcohol, tobacco or illicit drugs  . Current medications and supplements . Functional ability and status . Nutritional status . Physical activity . Advanced directives . List of other  physicians . Screenings to include cognitive, depression, and falls . Referrals and appointments  In addition, I have reviewed  and discussed with patient certain preventive protocols, quality metrics, and best practice recommendations. A written personalized care plan for preventive services as well as general preventive health recommendations were provided to patient.     Michiel Cowboy, RN  08/19/2018   Medical screening examination/treatment/procedure(s) were performed by non-physician practitioner and as supervising physician I was immediately available for consultation/collaboration. I agree with above. [name], MD

## 2018-08-19 NOTE — Telephone Encounter (Signed)
error 

## 2018-08-19 NOTE — Patient Instructions (Signed)
Continue doing brain stimulating activities (puzzles, reading, adult coloring books, staying active) to keep memory sharp.   Continue to eat heart healthy diet (full of fruits, vegetables, whole grains, lean protein, water--limit salt, fat, and sugar intake) and increase physical activity as tolerated.   Beverly Kim , Thank you for taking time to come for your Medicare Wellness Visit. I appreciate your ongoing commitment to your health goals. Please review the following plan we discussed and let me know if I can assist you in the future.   These are the goals we discussed: Goals    . Patient Stated     Stay as healthy and as independent possible. Worship God, enjoy life and family.       This is a list of the screening recommended for you and due dates:  Health Maintenance  Topic Date Due  . Complete foot exam   09/27/1981  . Eye exam for diabetics  09/27/1981  . Urine Protein Check  09/27/1981  . DEXA scan (bone density measurement)  09/28/1991  . Tetanus Vaccine  03/05/2013  . Pneumonia vaccines (2 of 2 - PCV13) 08/06/2019*  . Flu Shot  10/04/2018  . Hemoglobin A1C  02/05/2019  *Topic was postponed. The date shown is not the original due date.    Preventive Care 67 Years and Older, Female Preventive care refers to lifestyle choices and visits with your health care provider that can promote health and wellness. What does preventive care include?  A yearly physical exam. This is also called an annual well check.  Dental exams once or twice a year.  Routine eye exams. Ask your health care provider how often you should have your eyes checked.  Personal lifestyle choices, including: ? Daily care of your teeth and gums. ? Regular physical activity. ? Eating a healthy diet. ? Avoiding tobacco and drug use. ? Limiting alcohol use. ? Practicing safe sex. ? Taking low-dose aspirin every day. ? Taking vitamin and mineral supplements as recommended by your health care provider.  What happens during an annual well check? The services and screenings done by your health care provider during your annual well check will depend on your age, overall health, lifestyle risk factors, and family history of disease. Counseling Your health care provider may ask you questions about your:  Alcohol use.  Tobacco use.  Drug use.  Emotional well-being.  Home and relationship well-being.  Sexual activity.  Eating habits.  History of falls.  Memory and ability to understand (cognition).  Work and work Statistician.  Reproductive health.  Screening You may have the following tests or measurements:  Height, weight, and BMI.  Blood pressure.  Lipid and cholesterol levels. These may be checked every 5 years, or more frequently if you are over 34 years old.  Skin check.  Lung cancer screening. You may have this screening every year starting at age 12 if you have a 30-pack-year history of smoking and currently smoke or have quit within the past 15 years.  Colorectal cancer screening. All adults should have this screening starting at age 64 and continuing until age 28. You will have tests every 1-10 years, depending on your results and the type of screening test. People at increased risk should start screening at an earlier age. Screening tests may include: ? Guaiac-based fecal occult blood testing. ? Fecal immunochemical test (FIT). ? Stool DNA test. ? Virtual colonoscopy. ? Sigmoidoscopy. During this test, a flexible tube with a tiny camera (sigmoidoscope) is used to  examine your rectum and lower colon. The sigmoidoscope is inserted through your anus into your rectum and lower colon. ? Colonoscopy. During this test, a long, thin, flexible tube with a tiny camera (colonoscope) is used to examine your entire colon and rectum.  Hepatitis C blood test.  Hepatitis B blood test.  Sexually transmitted disease (STD) testing.  Diabetes screening. This is done by checking  your blood sugar (glucose) after you have not eaten for a while (fasting). You may have this done every 1-3 years.  Bone density scan. This is done to screen for osteoporosis. You may have this done starting at age 27.  Mammogram. This may be done every 1-2 years. Talk to your health care provider about how often you should have regular mammograms. Talk with your health care provider about your test results, treatment options, and if necessary, the need for more tests. Vaccines Your health care provider may recommend certain vaccines, such as:  Influenza vaccine. This is recommended every year.  Tetanus, diphtheria, and acellular pertussis (Tdap, Td) vaccine. You may need a Td booster every 10 years.  Varicella vaccine. You may need this if you have not been vaccinated.  Zoster vaccine. You may need this after age 11.  Measles, mumps, and rubella (MMR) vaccine. You may need at least one dose of MMR if you were born in 1957 or later. You may also need a second dose.  Pneumococcal 13-valent conjugate (PCV13) vaccine. One dose is recommended after age 81.  Pneumococcal polysaccharide (PPSV23) vaccine. One dose is recommended after age 58.  Meningococcal vaccine. You may need this if you have certain conditions.  Hepatitis A vaccine. You may need this if you have certain conditions or if you travel or work in places where you may be exposed to hepatitis A.  Hepatitis B vaccine. You may need this if you have certain conditions or if you travel or work in places where you may be exposed to hepatitis B.  Haemophilus influenzae type b (Hib) vaccine. You may need this if you have certain conditions. Talk to your health care provider about which screenings and vaccines you need and how often you need them. This information is not intended to replace advice given to you by your health care provider. Make sure you discuss any questions you have with your health care provider. Document Released:  03/18/2015 Document Revised: 04/11/2017 Document Reviewed: 12/21/2014 Elsevier Interactive Patient Education  2019 [employer].

## 2018-08-20 ENCOUNTER — Telehealth: Payer: Self-pay | Admitting: *Deleted

## 2018-08-20 NOTE — Telephone Encounter (Signed)
A message was left, re: follow up visit. 

## 2018-08-21 ENCOUNTER — Encounter: Payer: Self-pay | Admitting: Cardiology

## 2018-08-21 NOTE — Progress Notes (Signed)
Remote pacemaker transmission.   

## 2018-08-25 DIAGNOSIS — M25512 Pain in left shoulder: Secondary | ICD-10-CM | POA: Diagnosis not present

## 2018-09-03 DIAGNOSIS — I509 Heart failure, unspecified: Secondary | ICD-10-CM | POA: Diagnosis not present

## 2018-09-03 DIAGNOSIS — S42202D Unspecified fracture of upper end of left humerus, subsequent encounter for fracture with routine healing: Secondary | ICD-10-CM | POA: Diagnosis not present

## 2018-09-03 DIAGNOSIS — Z9181 History of falling: Secondary | ICD-10-CM | POA: Diagnosis not present

## 2018-09-03 DIAGNOSIS — I110 Hypertensive heart disease with heart failure: Secondary | ICD-10-CM | POA: Diagnosis not present

## 2018-09-08 DIAGNOSIS — S42215A Unspecified nondisplaced fracture of surgical neck of left humerus, initial encounter for closed fracture: Secondary | ICD-10-CM | POA: Diagnosis not present

## 2018-09-09 DIAGNOSIS — S42202D Unspecified fracture of upper end of left humerus, subsequent encounter for fracture with routine healing: Secondary | ICD-10-CM | POA: Diagnosis not present

## 2018-09-09 DIAGNOSIS — I509 Heart failure, unspecified: Secondary | ICD-10-CM | POA: Diagnosis not present

## 2018-09-09 DIAGNOSIS — Z9181 History of falling: Secondary | ICD-10-CM | POA: Diagnosis not present

## 2018-09-09 DIAGNOSIS — I110 Hypertensive heart disease with heart failure: Secondary | ICD-10-CM | POA: Diagnosis not present

## 2018-09-16 DIAGNOSIS — S42215A Unspecified nondisplaced fracture of surgical neck of left humerus, initial encounter for closed fracture: Secondary | ICD-10-CM | POA: Diagnosis not present

## 2018-09-26 DIAGNOSIS — S42202D Unspecified fracture of upper end of left humerus, subsequent encounter for fracture with routine healing: Secondary | ICD-10-CM | POA: Diagnosis not present

## 2018-09-26 DIAGNOSIS — Z9181 History of falling: Secondary | ICD-10-CM | POA: Diagnosis not present

## 2018-09-26 DIAGNOSIS — I110 Hypertensive heart disease with heart failure: Secondary | ICD-10-CM | POA: Diagnosis not present

## 2018-09-26 DIAGNOSIS — I509 Heart failure, unspecified: Secondary | ICD-10-CM | POA: Diagnosis not present

## 2018-09-29 DIAGNOSIS — M25532 Pain in left wrist: Secondary | ICD-10-CM | POA: Diagnosis not present

## 2018-09-29 DIAGNOSIS — S42215A Unspecified nondisplaced fracture of surgical neck of left humerus, initial encounter for closed fracture: Secondary | ICD-10-CM | POA: Diagnosis not present

## 2018-10-07 NOTE — Progress Notes (Signed)
Cardiology Office Note Date:  10/08/2018  Patient ID:  Beverly Kim, Beverly Kim [birth year], MRN [id number] PCP:  [name], MD  Cardiologist:  Dr. Percival Spanish Electrophysiologist: Dr. Rayann Heman    Chief Complaint: annual EP visit  History of Present Illness: Beverly Kim is a 85 y.o. female with history of CAD (70% LAD stenosis in 2003), HTN, HLD, hypothyroidism, LBBB, CHB w/PPM.  LVEF historically 45-50% range improved to 60-65% , chronic CHF (diastolic)  She comes in today to be seen for Dr. Rayann Heman, last seen by him July 2019,  She had normal pacaer function, no changes made, <1%v AF burden without plans for a/c unless burden increased, c/o fatigue prompted reduction in her BB dose, planned for annual EP visit.  More recently she saw Dr. Percival Spanish Aug 2019, at that time she had recently had her metoprolol changed to bisoprolol with increased SOB and concerns of her asthma.  Despite the change she had no improvement, not sleeping well with some SOB and chronic shoulder pain more weak in general. Her SOB felt to be more c/w deconditioning then HF exacerbation, her fatigue felt to be secondary to poor sleep (2/2 pain).  No changes were made in her therapies.  She suffered a mechanical fall in June, broke her R arm and has been staying with her daughter getting home PT.  She reports she was walking outside when her knee stiffened up on her and she started stumbling forward unable to stop and fell.  No syncope.  She reports doing well with her PT and hopes to be able to get back home She denies any CP, will occasionally feel a little skip only.  She will feel dizzy when turning her head at times even just a bit, otherwise no dizziness, no near syncope or syncope.  She mentions skin rash under her breasts, says is a heat rash that she gets periodically and asks about a nystatin rx at the recommendation of her daughter.   She does not want me to take a look, denies any open wounds, sores or drainage.   I encouraged local care, keep clean and dry and if not improved to reach out to her PMD.   Device information: SJM dual chamber PPM implant, 05/15/16   Past Medical History:  Diagnosis Date  . Atrial fibrillation ([place]) 08/16/2016   observed on PPM interrogation, asymptomatic, chad2vasc score is 4.  will consider anticoagulation  . CHF (congestive heart failure) ([place])   . Colon polyps   . Coronary artery disease   . Diverticulosis   . Dyspnea   . GERD (gastroesophageal reflux disease)   . Hearing loss of both ears   . Hepatic cyst   . Hiatal hernia   . Hyperlipidemia   . Hypertension   . Hypothyroidism   . Low back pain   . Mitral valve disorders(424.0)   . Other left bundle branch block   . Palpitations   . URI (upper respiratory infection)     Past Surgical History:  Procedure Laterality Date  . CARDIAC CATHETERIZATION  04-18-01  . COLONOSCOPY    . HERNIA REPAIR    . PACEMAKER IMPLANT Left 05/15/2016   SJM Assirity MRI dual chamber PPM implanted by Dr Rayann Heman for CHB  . THYROIDECTOMY    . TONSILLECTOMY AND ADENOIDECTOMY    . TOTAL ABDOMINAL HYSTERECTOMY W/ BILATERAL SALPINGOOPHORECTOMY      Current Outpatient Medications  Medication Sig Dispense Refill  . albuterol (PROVENTIL HFA;VENTOLIN HFA) 108 (90 Base)  MCG/ACT inhaler Inhale 1-2 puffs into the lungs every 6 (six) hours as needed for wheezing or shortness of breath. 1 Inhaler 0  . aspirin 81 MG tablet Take 81 mg by mouth daily.    Marland Kitchen BIOTIN PO Take 1 tablet by mouth daily.    . bisoprolol (ZEBETA) 5 MG tablet Take 1 tablet (5 mg total) by mouth daily. 90 tablet 3  . calcium carbonate (TUMS - DOSED IN MG ELEMENTAL CALCIUM) 500 MG chewable tablet Chew 1 tablet by mouth daily as needed for indigestion or heartburn.    . Cholecalciferol (VITAMIN D3 PO) Take 2 capsules by mouth daily.    . furosemide (LASIX) 40 MG tablet TAKE 1 TABLET BY MOUTH ONCE DAILY 30 tablet 6  . metolazone (ZAROXOLYN) 2.5 MG tablet TAKE ONE TABLET  BY MOUTH THREE TIMES A WEEK 30 tablet 5  . Multiple Vitamins-Minerals (CENTRUM SILVER PO) Take 1 tablet by mouth daily.    . Omega-3 Fatty Acids (FISH OIL PO) Take 1 capsule by mouth daily.    Marland Kitchen SYNTHROID 88 MCG tablet TAKE 1 TABLET BY MOUTH ONCE DAILY BEFORE BREAKFAST 90 tablet 1  . vitamin C (ASCORBIC ACID) 500 MG tablet Take 500 mg by mouth daily.     No current facility-administered medications for this visit.     Allergies:   Adhesive [tape], Ace inhibitors, Atorvastatin, Clindamycin, Codeine, Latex, Shellfish allergy, Simvastatin, and Penicillins   Social History:  The patient  reports that she has never smoked. She has never used smokeless tobacco. She reports that she does not drink alcohol or use drugs.   Family History:  The patient's family history includes Coronary artery disease in an other family member; Diabetes in an other family member; Heart Problems in an other family member; Heart attack in her daughter; Heart disease in her brother and sister; Other in some other family members; Sudden death in her son.  ROS:  Please see the history of present illness.  All other systems are reviewed and otherwise negative.   PHYSICAL EXAM:  VS:  BP 132/62   Pulse 69   Ht 5\' 3"  (1.6 m)   Wt 177 lb (80.3 kg)   BMI 31.35 kg/m  BMI: Body mass index is 31.35 kg/m. Well nourished, well developed, in no acute distress  HEENT: normocephalic, atraumatic  Neck: no JVD, carotid bruits or masses Cardiac: RRR; no significant murmurs, no rubs, or gallops Lungs:  CTA b/l, no wheezing, rhonchi or rales  Abd: soft, nontender MS: no deformity or atrophy, L arm is in a sling Ext: no edema  Skin: warm and dry, no rash Neuro:  No gross deficits appreciated Psych: euthymic mood, full affect  PPM site is stable, no tethering or discomfort   EKG:  Done today and reviewed by myself shows SR, V paced, PVC  PPM interrogation done today and reviewed by myself: battery and lead measurements are  good. She is device dependent today. 10 AMS episodes, one EGM is an AT (6 seconds) Longest was 65min44sec back in Nov 2019 (no EGM available  06/27/16: TTE Study Conclusions - Left ventricle: The cavity size was normal. There was moderate   focal basal hypertrophy of the septum. Systolic function was   normal. The estimated ejection fraction was in the range of 60%   to 65%. Wall motion was normal; there were no regional wall   motion abnormalities. Doppler parameters are consistent with   abnormal left ventricular relaxation (grade 1 diastolic   dysfunction). -  Aortic valve: Trileaflet; mildly thickened, mildly calcified   leaflets. There was trivial regurgitation. - Mitral valve: There was mild regurgitation. - Left atrium: Volume/bsa, S: 32.7 ml/m^2. - Tricuspid valve: There was mild regurgitation.  Impressions:  - EF is improved when compared to prior study.    Recent Labs: 08/06/2018: ALT 15; BUN 24; Creatinine, Ser 1.06; Hemoglobin 14.0; Platelets 205.0; Potassium 3.8; Pro B Natriuretic peptide (BNP) 356.0; Sodium 139; TSH 0.94  08/06/2018: Cholesterol 205; HDL 41.70; LDL Cholesterol 132; Total CHOL/HDL Ratio 5; Triglycerides 156.0; VLDL 31.2   CrCl cannot be calculated (Patient's most recent lab result is older than the maximum 21 days allowed.).   Wt Readings from Last 3 Encounters:  10/08/18 177 lb (80.3 kg)  08/06/18 178 lb 12.8 oz (81.1 kg)  10/21/17 182 lb (82.6 kg)     Other studies reviewed: Additional studies/records reviewed today include: summarized above  ASSESSMENT AND PLAN:   1. PPM     Intact function, no programming changes made  2. Paroxysmal Afib     Given very low buren to date, not started on a/c     EGM available for review is an AT of 6 seconds     AMS burden is <1%  3. CAD     Not very active since her fall and arm injury     No anginal or cardiac symptoms     Due to see Dr. Percival Spanish next month  4. Chronic CHF (diastolic)     No  symptoms or exam findings to suggest fluid OL     Weight is stable  5. HTN     Looks good, no changes    Disposition: F/u with continue Q 3 mo remotes (she has her transmitter at her daughter's house), annual in-clini EP visit, sooner if needed.    Current medicines are reviewed at length with the patient today.  The patient did not have any concerns regarding medicines.  Venetia Night, PA-C 10/08/2018 4:06 PM     [place]  [phone] (office)  [phone] (fax)

## 2018-10-08 ENCOUNTER — Ambulatory Visit: Payer: PPO | Admitting: Physician Assistant

## 2018-10-08 ENCOUNTER — Encounter: Payer: Self-pay | Admitting: Physician Assistant

## 2018-10-08 ENCOUNTER — Other Ambulatory Visit: Payer: Self-pay

## 2018-10-08 VITALS — BP 132/62 | HR 69 | Ht 63.0 in | Wt 177.0 lb

## 2018-10-08 DIAGNOSIS — I442 Atrioventricular block, complete: Secondary | ICD-10-CM | POA: Diagnosis not present

## 2018-10-08 DIAGNOSIS — I471 Supraventricular tachycardia: Secondary | ICD-10-CM | POA: Diagnosis not present

## 2018-10-08 DIAGNOSIS — Z9181 History of falling: Secondary | ICD-10-CM | POA: Diagnosis not present

## 2018-10-08 DIAGNOSIS — I10 Essential (primary) hypertension: Secondary | ICD-10-CM

## 2018-10-08 DIAGNOSIS — I480 Paroxysmal atrial fibrillation: Secondary | ICD-10-CM | POA: Diagnosis not present

## 2018-10-08 DIAGNOSIS — S42202D Unspecified fracture of upper end of left humerus, subsequent encounter for fracture with routine healing: Secondary | ICD-10-CM | POA: Diagnosis not present

## 2018-10-08 DIAGNOSIS — I509 Heart failure, unspecified: Secondary | ICD-10-CM | POA: Diagnosis not present

## 2018-10-08 DIAGNOSIS — I2510 Atherosclerotic heart disease of native coronary artery without angina pectoris: Secondary | ICD-10-CM

## 2018-10-08 DIAGNOSIS — Z950 Presence of cardiac pacemaker: Secondary | ICD-10-CM

## 2018-10-08 DIAGNOSIS — I110 Hypertensive heart disease with heart failure: Secondary | ICD-10-CM | POA: Diagnosis not present

## 2018-10-08 NOTE — Patient Instructions (Addendum)
Medication Instructions:  Your physician recommends that you continue on your current medications as directed. Please refer to the Current Medication list given to you today.  If you need a refill on your cardiac medications before your next appointment, please call your pharmacy.   Lab work: NONE ORDERED  TODAY'  If you have labs (blood work) drawn today and your tests are completely normal, you will receive your results only by: Marland Kitchen MyChart Message (if you have MyChart) OR . A paper copy in the mail If you have any lab test that is abnormal or we need to change your treatment, we will call you to review the results.  Testing/Procedures: NONE ORDERED  TODAY  Follow-Up: At [facility], you and your health needs are our priority.  As part of our continuing mission to provide you with exceptional heart care, we have created designated Provider Care Teams.  These Care Teams include your primary Cardiologist (physician) and Advanced Practice Providers (APPs -  Physician Assistants and Nurse Practitioners) who all work together to provide you with the care you need, when you need it. You will need a follow up appointment in 1 years.  Please call our office 2 months in advance to schedule this appointment.  You may see Dr. Rayann Heman  or one of the following Advanced Practice Providers on your designated Care Team:   Chanetta Marshall, NP . Tommye Standard, PA-C  Remote monitoring is used to monitor your Pacemaker of ICD from home. This monitoring reduces the number of office visits required to check your device to one time per year. It allows [place] to keep an eye on the functioning of your device to ensure it is working properly. You are scheduled for a device check from home on . 11-13-18  You may send your transmission at any time that day. If you have a wireless device, the transmission will be sent automatically. After your physician reviews your transmission, you will receive a postcard with your next  transmission date.   Any Other Special Instructions Will Be Listed Below (If Applicable).

## 2018-10-13 DIAGNOSIS — B372 Candidiasis of skin and nail: Secondary | ICD-10-CM | POA: Diagnosis not present

## 2018-10-13 DIAGNOSIS — S42215A Unspecified nondisplaced fracture of surgical neck of left humerus, initial encounter for closed fracture: Secondary | ICD-10-CM | POA: Diagnosis not present

## 2018-10-13 DIAGNOSIS — R42 Dizziness and giddiness: Secondary | ICD-10-CM | POA: Diagnosis not present

## 2018-10-21 ENCOUNTER — Telehealth: Payer: Self-pay | Admitting: Internal Medicine

## 2018-10-21 ENCOUNTER — Telehealth: Payer: Self-pay

## 2018-10-21 DIAGNOSIS — H9193 Unspecified hearing loss, bilateral: Secondary | ICD-10-CM | POA: Diagnosis not present

## 2018-10-21 DIAGNOSIS — J3489 Other specified disorders of nose and nasal sinuses: Secondary | ICD-10-CM | POA: Diagnosis not present

## 2018-10-21 DIAGNOSIS — R42 Dizziness and giddiness: Secondary | ICD-10-CM | POA: Diagnosis not present

## 2018-10-21 MED ORDER — BISOPROLOL FUMARATE 5 MG PO TABS: 5.0000 mg | ORAL_TABLET | Freq: Every day | ORAL | 1 refills | Status: DC

## 2018-10-21 MED ORDER — NYSTATIN 100000 UNIT/GM EX CREA: 1.0000 "application " | TOPICAL_CREAM | Freq: Two times a day (BID) | CUTANEOUS | 5 refills | Status: DC

## 2018-10-21 NOTE — Addendum Note (Signed)
Addended by: Delice Bison E on: 10/21/2018 04:14 PM   Modules accepted: Orders

## 2018-10-21 NOTE — Telephone Encounter (Signed)
LVM letting daughter know. 

## 2018-10-21 NOTE — Telephone Encounter (Signed)
Patient's daughter is calling to ask why prescription was denied.  Please advise

## 2018-10-21 NOTE — Telephone Encounter (Signed)
Rx sent 

## 2018-10-21 NOTE — Telephone Encounter (Signed)
Nystatin cream sent to [facility]

## 2018-10-21 NOTE — Telephone Encounter (Signed)
Copied from [place] [id number]. Topic: General - Other >> Oct 21, 2018 12:53 PM Keene Breath wrote: Reason for CRM: Patient's daughter called to ask the doctor to  send in a script for a rash that the patient has under her breast.  Please advise and call back at [phone]

## 2018-10-22 ENCOUNTER — Encounter: Payer: Self-pay | Admitting: Internal Medicine

## 2018-10-22 DIAGNOSIS — R2689 Other abnormalities of gait and mobility: Secondary | ICD-10-CM | POA: Insufficient documentation

## 2018-11-03 DIAGNOSIS — S42215A Unspecified nondisplaced fracture of surgical neck of left humerus, initial encounter for closed fracture: Secondary | ICD-10-CM | POA: Diagnosis not present

## 2018-11-13 ENCOUNTER — Ambulatory Visit (INDEPENDENT_AMBULATORY_CARE_PROVIDER_SITE_OTHER): Payer: PPO | Admitting: *Deleted

## 2018-11-13 DIAGNOSIS — I447 Left bundle-branch block, unspecified: Secondary | ICD-10-CM

## 2018-11-13 DIAGNOSIS — I442 Atrioventricular block, complete: Secondary | ICD-10-CM

## 2018-11-13 LAB — CUP PACEART REMOTE DEVICE CHECK
Battery Remaining Longevity: 118 mo
Battery Remaining Percentage: 95.5 %
Battery Voltage: 2.99 V
Brady Statistic AP VP Percent: 54 %
Brady Statistic AP VS Percent: 1 %
Brady Statistic AS VP Percent: 45 %
Brady Statistic AS VS Percent: 1 %
Brady Statistic RA Percent Paced: 52 %
Brady Statistic RV Percent Paced: 99 %
Date Time Interrogation Session: 20200910060012
Implantable Lead Implant Date: 20180313
Implantable Lead Implant Date: 20180313
Implantable Lead Location: 753859
Implantable Lead Location: 753860
Implantable Pulse Generator Implant Date: 20180313
Lead Channel Impedance Value: 410 Ohm
Lead Channel Impedance Value: 480 Ohm
Lead Channel Pacing Threshold Amplitude: 0.75 V
Lead Channel Pacing Threshold Amplitude: 0.875 V
Lead Channel Pacing Threshold Pulse Width: 0.5 ms
Lead Channel Pacing Threshold Pulse Width: 0.5 ms
Lead Channel Sensing Intrinsic Amplitude: 12 mV
Lead Channel Sensing Intrinsic Amplitude: 2.4 mV
Lead Channel Setting Pacing Amplitude: 1 V
Lead Channel Setting Pacing Amplitude: 1.875
Lead Channel Setting Pacing Pulse Width: 0.5 ms
Lead Channel Setting Sensing Sensitivity: 4 mV
Pulse Gen Model: 2272
Pulse Gen Serial Number: 8005625

## 2018-11-18 ENCOUNTER — Telehealth: Payer: Self-pay | Admitting: *Deleted

## 2018-11-18 NOTE — Telephone Encounter (Signed)
[place] alert received for AT/AF burden 4.8% since last clinic visit on 10/08/18. All episodes occurred 9/13- Presenting rhythm as of 11/18/18 at 03:34 is AF/VP @ 69bpm. AF undersensed at times, so burden may be underestimated. RA sensitivity programmed at 0.69mV. V rates controlled. Not currently on [place] due to low AF burden and advanced age per Dr. Jackalyn Lombard 09/2017 note.  LMOM requesting call back to DC. Will ask patient if symptomatic with episodes.

## 2018-11-21 NOTE — Telephone Encounter (Signed)
LMOM requesting call back to DC. Direct number given. 

## 2018-11-24 NOTE — Progress Notes (Signed)
Remote pacemaker transmission.   

## 2018-11-25 ENCOUNTER — Ambulatory Visit: Payer: PPO | Admitting: Cardiology

## 2018-11-25 ENCOUNTER — Other Ambulatory Visit: Payer: Self-pay

## 2018-11-25 ENCOUNTER — Encounter: Payer: Self-pay | Admitting: Cardiology

## 2018-11-25 VITALS — BP 128/65 | HR 63 | Ht 63.0 in | Wt 175.6 lb

## 2018-11-25 DIAGNOSIS — R0609 Other forms of dyspnea: Secondary | ICD-10-CM

## 2018-11-25 DIAGNOSIS — E782 Mixed hyperlipidemia: Secondary | ICD-10-CM

## 2018-11-25 DIAGNOSIS — I442 Atrioventricular block, complete: Secondary | ICD-10-CM

## 2018-11-25 DIAGNOSIS — I5032 Chronic diastolic (congestive) heart failure: Secondary | ICD-10-CM

## 2018-11-25 DIAGNOSIS — R5383 Other fatigue: Secondary | ICD-10-CM

## 2018-11-25 MED ORDER — FUROSEMIDE 40 MG PO TABS: 40.0000 mg | ORAL_TABLET | Freq: Every day | ORAL | 3 refills | Status: DC

## 2018-11-25 NOTE — Patient Instructions (Signed)
Medication Instructions:  Your physician recommends that you continue on your current medications as directed. Please refer to the Current Medication list given to you today.  If you need a refill on your cardiac medications before your next appointment, please call your pharmacy.   Lab work: NONE  Testing/Procedures: NONE  Follow-Up: At [facility], you and your health needs are our priority.  As part of our continuing mission to provide you with exceptional heart care, we have created designated Provider Care Teams.  These Care Teams include your primary Cardiologist (physician) and Advanced Practice Providers (APPs -  Physician Assistants and Nurse Practitioners) who all work together to provide you with the care you need, when you need it. You will need a follow up appointment in 12 months.  Please call our office 2 months in advance to schedule this appointment.  You may see Dr. Hochrein or one of the following Advanced Practice Providers on your designated Care Team:   Rhonda Barrett, PA-C Kathryn Lawrence, DNP, ANP      

## 2018-11-25 NOTE — Progress Notes (Addendum)
HPI The patient presents for evaluation of CHB.  She has a past history of nonobstructive coronary disease with a 70% LAD stenosis in 2003. In 2013 she had an echo which was low normal LV function but mild mitral regurgitation. In November of 2013 she had a stress perfusion study with no evidence of ischemia or infarct. She has had some suggestion of diastolic dysfunction on echo. She has had pulmonary function tests which demonstrated normal lung volumes and only a mildly reduced diffusion capacity.  EF on echo in 2015 was mildly reduced at 45 - 50%.  She presented in mid March with fatigue and dyspnea and was found to have heart block.   Because of the previous reduced EF she was given CRT.   In 2018 her ejection fraction was 60-65% with mild mitral regurgitation.   She returns for follow up.  She saw EP last month.    She has episodes of shortness of breath.  These are very brief episodes that last only for a few seconds at a time.  She is typically lying in bed and she has to take some deep breaths.  It only lasts for about 10 to 20 seconds.  It is not every night.  She was told to start taking a Tums in case this had something to do with reflux.  She is not describing PND or orthopnea.  She has not had any new palpitations, presyncope or syncope.  She denies any chest pressure, neck or arm discomfort.  She has had no new edema.    Allergies  Allergen Reactions  . Adhesive [Tape] Itching, Dermatitis, Rash and Other (See Comments)    Blisters and "skin bubbles"  . Ace Inhibitors Cough  . Atorvastatin Other (See Comments)    REACTION: Reaction not known  . Clindamycin Other (See Comments)    Unknown  . Codeine Other (See Comments)    hallucinations   . Latex Other (See Comments)    blisters  . Shellfish Allergy Other (See Comments)    "gallblater attack"  . Simvastatin Other (See Comments)    REACTION: fatigue  . Penicillins Rash    Has patient had a PCN reaction causing immediate  rash, facial/tongue/throat swelling, SOB or lightheadedness with hypotension: Unknown Has patient had a PCN reaction causing severe rash involving mucus membranes or skin necrosis: Unknown Has patient had a PCN reaction that required hospitalization: Unknown Has patient had a PCN reaction occurring within the last 10 years: No If all of the above answers are "NO", then may proceed with Cephalosporin use.     Current Outpatient Medications  Medication Sig Dispense Refill  . albuterol (PROVENTIL HFA;VENTOLIN HFA) 108 (90 Base) MCG/ACT inhaler Inhale 1-2 puffs into the lungs every 6 (six) hours as needed for wheezing or shortness of breath. 1 Inhaler 0  . aspirin 81 MG tablet Take 81 mg by mouth daily.    Marland Kitchen BIOTIN PO Take 1 tablet by mouth daily.    . bisoprolol (ZEBETA) 5 MG tablet Take 1 tablet (5 mg total) by mouth daily. 90 tablet 1  . calcium carbonate (TUMS - DOSED IN MG ELEMENTAL CALCIUM) 500 MG chewable tablet Chew 1 tablet by mouth daily as needed for indigestion or heartburn.    . Cholecalciferol (VITAMIN D3 PO) Take 2 capsules by mouth daily.    . furosemide (LASIX) 40 MG tablet Take 1 tablet (40 mg total) by mouth daily. 90 tablet 3  . metolazone (ZAROXOLYN) 2.5 MG tablet  TAKE ONE TABLET BY MOUTH THREE TIMES A WEEK 30 tablet 5  . Multiple Vitamins-Minerals (CENTRUM SILVER PO) Take 1 tablet by mouth daily.    Marland Kitchen nystatin cream (MYCOSTATIN) Apply 1 application topically 2 (two) times daily. 30 g 5  . Omega-3 Fatty Acids (FISH OIL PO) Take 1 capsule by mouth daily.    Marland Kitchen SYNTHROID 88 MCG tablet TAKE 1 TABLET BY MOUTH ONCE DAILY BEFORE BREAKFAST 90 tablet 1  . vitamin C (ASCORBIC ACID) 500 MG tablet Take 500 mg by mouth daily.     No current facility-administered medications for this visit.     Past Medical History:  Diagnosis Date  . Atrial fibrillation ([place]) 08/16/2016   observed on PPM interrogation, asymptomatic, chad2vasc score is 4.  will consider anticoagulation  . CHF  (congestive heart failure) ([place])   . Colon polyps   . Coronary artery disease   . Diverticulosis   . Dyspnea   . GERD (gastroesophageal reflux disease)   . Hearing loss of both ears   . Hepatic cyst   . Hiatal hernia   . Hyperlipidemia   . Hypertension   . Hypothyroidism   . Low back pain   . Mitral valve disorders(424.0)   . Other left bundle branch block   . Palpitations   . URI (upper respiratory infection)     Past Surgical History:  Procedure Laterality Date  . CARDIAC CATHETERIZATION  04-18-01  . COLONOSCOPY    . HERNIA REPAIR    . PACEMAKER IMPLANT Left 05/15/2016   SJM Assirity MRI dual chamber PPM implanted by Dr Rayann Heman for CHB  . THYROIDECTOMY    . TONSILLECTOMY AND ADENOIDECTOMY    . TOTAL ABDOMINAL HYSTERECTOMY W/ BILATERAL SALPINGOOPHORECTOMY      ROS:   As stated in the HPI and positive for headaches, dizziness.    PHYSICAL EXAM BP 128/65   Pulse 63   Ht 5\' 3"  (1.6 m)   Wt 175 lb 9.6 oz (79.7 kg)   BMI 31.11 kg/m   GEN:  No distress NECK:  No jugular venous distention at 90 degrees, waveform within normal limits, carotid upstroke brisk and symmetric, no bruits, no thyromegaly LYMPHATICS:  No cervical adenopathy LUNGS:  Clear to auscultation bilaterally BACK:  No CVA tenderness CHEST:  Unremarkable HEART:  S1 and S2 within normal limits, no S3, no S4, no clicks, no rubs, no murmurs ABD:  Positive bowel sounds normal in frequency in pitch, no bruits, no rebound, no guarding, unable to assess midline mass or bruit with the patient seated. EXT:  2 plus pulses throughout, trace edema, no cyanosis no clubbing SKIN:  No rashes no nodules NEURO:  Cranial nerves II through XII grossly intact, motor grossly intact throughout PSYCH:  Cognitively intact, oriented to person place and time   EKG: NA  ASSESSMENT AND PLAN   CHRONIC DIASTOLIC HF:     I do not think that her episodes of heart failure.  She did have a slightly elevated BNP in the past.  At this  point I would not change her medical therapy.  I think the episodes of shortness of breath could be reflux but she does not want to take Prilosec.  She was told to take Tums every day and I have reinforced this as this might help.  I be happy to reevaluate if this worsens.  She should avoid salt and have judicious fluid.   HTN:   The blood pressure is well treated.  No change  in therapy.   CHB S/P CRT:    She is up to date with follow up and I reviewed the most recent EP appt notes for this visit.    ATRIAL FIB:   Less than 1 percent atrial fib on device follow up.  The plan was no DOAC unless there is increased burden.    FATIGUE:  She continue to have poor sleep.  This has been a chronic problem.  She has not been anemic recently.  Thyroid has been normal.  No change in therapy.

## 2018-11-28 NOTE — Telephone Encounter (Signed)
Follow up ° ° °Patient is returning call. Please call. °

## 2018-11-28 NOTE — Telephone Encounter (Signed)
Follow up   Please call the phone # that is listed for her daughter.

## 2018-12-01 NOTE — Telephone Encounter (Signed)
She was just seen by Dr Percival Spanish.  I will forward to him for review.  Beverly Kim, looks like her AF burden has increased.  May be best to start anticoagulation if not contraindicated.

## 2018-12-01 NOTE — Telephone Encounter (Signed)
Spoke with patient and daughter regarding increased AF burden since ~11/16/2018. As of last alert transmission received on 11/25/18: AF burden now 19%, persistent since 11/22/18. Pt has had more frequent complaints of SOB with exertion, heart quivering in the past 2 weeks per daughter, though symptoms are not constant. Reports she saw Dr. Percival Spanish on 11/25/18 and was told that her SOB did not seem CHF-related, denies any new LE edema, reports her abdomen has been tight off and on for awhile. Pt and daughter are agreeable to an AF Clinic appointment to discuss plan. Routed to AF Clinic staff for assistance scheduling. Routed to Dr. Rayann Heman as an Juluis Rainier.  AF burden as of 11/25/18:

## 2018-12-02 MED ORDER — APIXABAN 5 MG PO TABS: 5.0000 mg | ORAL_TABLET | Freq: Two times a day (BID) | ORAL | 11 refills | Status: DC

## 2018-12-02 NOTE — Telephone Encounter (Signed)
I called the patient no answer.  I called her daughter who is the preferred contact.  The patient has more atrial fib on her monitor.  I wills start Eliquis.  I discussed the risk benefit with her daughter and she agrees. Stop ASA and start Eliquis 5mg  bid po.  Disp number 60 with 11 refills.  She is to have follow up scheduled with the Atrial Fib Clinic.

## 2018-12-02 NOTE — Telephone Encounter (Signed)
Stopped the ASA and put in the order in for Eliquis 5mg  BID. Called pt and spoke to daughter about prescription. Verbalized understanding. Messaged scheduling for the A Fib clinic f/u appt.

## 2018-12-03 ENCOUNTER — Telehealth (HOSPITAL_COMMUNITY): Payer: Self-pay | Admitting: Physician Assistant

## 2018-12-03 NOTE — Telephone Encounter (Signed)
Called and left message for patient's daughter, Beverly Kim on file, to call A-Fib Clinic.  Need to schedule appt for pt who is starting Eliquis, needs appt in 3-4 wks per Rushie Goltz, RN.

## 2018-12-04 NOTE — Telephone Encounter (Signed)
Left 2nd VM for patient's daughter, [name], to call the Clinic to schedule appt.

## 2018-12-05 ENCOUNTER — Other Ambulatory Visit: Payer: Self-pay

## 2018-12-05 ENCOUNTER — Ambulatory Visit (HOSPITAL_COMMUNITY)
Admission: RE | Admit: 2018-12-05 | Discharge: 2018-12-05 | Disposition: A | Discharge status: Home / Self Care | Payer: PPO | Source: Ambulatory Visit | Attending: Physician Assistant | Admitting: Physician Assistant

## 2018-12-05 ENCOUNTER — Encounter (HOSPITAL_COMMUNITY): Payer: Self-pay | Admitting: Physician Assistant

## 2018-12-05 VITALS — BP 122/60 | HR 62 | Ht 63.0 in | Wt 173.6 lb

## 2018-12-05 DIAGNOSIS — E669 Obesity, unspecified: Secondary | ICD-10-CM | POA: Insufficient documentation

## 2018-12-05 DIAGNOSIS — K219 Gastro-esophageal reflux disease without esophagitis: Secondary | ICD-10-CM | POA: Diagnosis not present

## 2018-12-05 DIAGNOSIS — H9193 Unspecified hearing loss, bilateral: Secondary | ICD-10-CM | POA: Diagnosis not present

## 2018-12-05 DIAGNOSIS — I110 Hypertensive heart disease with heart failure: Secondary | ICD-10-CM | POA: Diagnosis not present

## 2018-12-05 DIAGNOSIS — Z7901 Long term (current) use of anticoagulants: Secondary | ICD-10-CM | POA: Insufficient documentation

## 2018-12-05 DIAGNOSIS — I4819 Other persistent atrial fibrillation: Secondary | ICD-10-CM | POA: Diagnosis not present

## 2018-12-05 DIAGNOSIS — Z6830 Body mass index (BMI) 30.0-30.9, adult: Secondary | ICD-10-CM | POA: Insufficient documentation

## 2018-12-05 DIAGNOSIS — E785 Hyperlipidemia, unspecified: Secondary | ICD-10-CM | POA: Insufficient documentation

## 2018-12-05 DIAGNOSIS — I447 Left bundle-branch block, unspecified: Secondary | ICD-10-CM | POA: Insufficient documentation

## 2018-12-05 DIAGNOSIS — Z79899 Other long term (current) drug therapy: Secondary | ICD-10-CM | POA: Diagnosis not present

## 2018-12-05 DIAGNOSIS — I5032 Chronic diastolic (congestive) heart failure: Secondary | ICD-10-CM | POA: Diagnosis not present

## 2018-12-05 DIAGNOSIS — I2510 Atherosclerotic heart disease of native coronary artery without angina pectoris: Secondary | ICD-10-CM | POA: Diagnosis not present

## 2018-12-05 DIAGNOSIS — E039 Hypothyroidism, unspecified: Secondary | ICD-10-CM | POA: Insufficient documentation

## 2018-12-05 NOTE — Progress Notes (Signed)
Primary Care Physician: [name], MD Primary Cardiologist: Dr Percival Spanish Primary Electrophysiologist: Dr Rayann Heman Referring Physician: Dr Calla Kicks is a 85 y.o. female with a history of CHB, non obstructive CAD, HTN, HLD, hearing loss, hypothyroidism, and persistent atrial fibrillation who presents for follow up in the [facility].  The patient was initially diagnosed with atrial fibrillation remotely but AF burden on her device interrogation has previously always been <1%. However, she appears to be in persistent afib since 11/16/18 per device clinic. She does have symptoms of increased fatigue and intermittent palpitations. She was advised to stop ASA and start Eliquis but has not started this yet. She denies alcohol use or significant snoring. There were no specific triggers that she could identify but she does note she has been under a lot of stress recently.  Today, she denies symptoms of chest pain, shortness of breath, orthopnea, PND, lower extremity edema, dizziness, presyncope, syncope, snoring, daytime somnolence, bleeding, or neurologic sequela. The patient is tolerating medications without difficulties and is otherwise without complaint today. +fatigue   Atrial Fibrillation Risk Factors:  she does not have symptoms or diagnosis of sleep apnea. she does not have a history of alcohol use. The patient does not have a history of early familial atrial fibrillation or other arrhythmias.  she has a BMI of Body mass index is 30.75 kg/m.Marland Kitchen Filed Weights   12/05/18 1042  Weight: 78.7 kg    Family History  Problem Relation Age of Onset  . Coronary artery disease Other        family hx of 1st degree relative <50  . Diabetes Other        family hx of  . Other Other        cardiovascular disorder family hx of  . Other Other        neurological disorder family hx of  . Other Other        respiratory disease family hx of  . Heart attack  Daughter   . Heart Problems Other        all children  . Sudden death Son   . Heart disease Brother   . Heart disease Sister   . Colon cancer Neg Hx   . Colon polyps Neg Hx      Atrial Fibrillation Management history:  Previous antiarrhythmic drugs: none Previous cardioversions: none Previous ablations: none CHADS2VASC score: 5 Anticoagulation history: none   Past Medical History:  Diagnosis Date  . Atrial fibrillation ([place]) 08/16/2016   observed on PPM interrogation, asymptomatic, chad2vasc score is 4.  will consider anticoagulation  . CHF (congestive heart failure) ([place])   . Colon polyps   . Coronary artery disease   . Diverticulosis   . Dyspnea   . GERD (gastroesophageal reflux disease)   . Hearing loss of both ears   . Hepatic cyst   . Hiatal hernia   . Hyperlipidemia   . Hypertension   . Hypothyroidism   . Low back pain   . Mitral valve disorders(424.0)   . Other left bundle branch block   . Palpitations   . URI (upper respiratory infection)    Past Surgical History:  Procedure Laterality Date  . CARDIAC CATHETERIZATION  04-18-01  . COLONOSCOPY    . HERNIA REPAIR    . PACEMAKER IMPLANT Left 05/15/2016   SJM Assirity MRI dual chamber PPM implanted by Dr Rayann Heman for CHB  . THYROIDECTOMY    .  TONSILLECTOMY AND ADENOIDECTOMY    . TOTAL ABDOMINAL HYSTERECTOMY W/ BILATERAL SALPINGOOPHORECTOMY      Current Outpatient Medications  Medication Sig Dispense Refill  . albuterol (PROVENTIL HFA;VENTOLIN HFA) 108 (90 Base) MCG/ACT inhaler Inhale 1-2 puffs into the lungs every 6 (six) hours as needed for wheezing or shortness of breath. 1 Inhaler 0  . apixaban (ELIQUIS) 5 MG TABS tablet Take 1 tablet (5 mg total) by mouth 2 (two) times daily. 60 tablet 11  . BIOTIN PO Take 1 tablet by mouth daily.    . bisoprolol (ZEBETA) 5 MG tablet Take 1 tablet (5 mg total) by mouth daily. 90 tablet 1  . calcium carbonate (TUMS - DOSED IN MG ELEMENTAL CALCIUM) 500 MG chewable tablet  Chew 1 tablet by mouth daily as needed for indigestion or heartburn.    . Cholecalciferol (VITAMIN D3 PO) Take 2 capsules by mouth daily.    . furosemide (LASIX) 40 MG tablet Take 1 tablet (40 mg total) by mouth daily. 90 tablet 3  . metolazone (ZAROXOLYN) 2.5 MG tablet TAKE ONE TABLET BY MOUTH THREE TIMES A WEEK 30 tablet 5  . Multiple Vitamins-Minerals (CENTRUM SILVER PO) Take 1 tablet by mouth daily.    Marland Kitchen nystatin cream (MYCOSTATIN) Apply 1 application topically 2 (two) times daily. 30 g 5  . Omega-3 Fatty Acids (FISH OIL PO) Take 1 capsule by mouth daily.    Marland Kitchen SYNTHROID 88 MCG tablet TAKE 1 TABLET BY MOUTH ONCE DAILY BEFORE BREAKFAST 90 tablet 1  . vitamin C (ASCORBIC ACID) 500 MG tablet Take 500 mg by mouth daily.     No current facility-administered medications for this encounter.     Allergies  Allergen Reactions  . Adhesive [Tape] Itching, Dermatitis, Rash and Other (See Comments)    Blisters and "skin bubbles"  . Ace Inhibitors Cough  . Atorvastatin Other (See Comments)    REACTION: Reaction not known  . Clindamycin Other (See Comments)    Unknown  . Codeine Other (See Comments)    hallucinations   . Latex Other (See Comments)    blisters  . Shellfish Allergy Other (See Comments)    "gallblater attack"  . Simvastatin Other (See Comments)    REACTION: fatigue  . Penicillins Rash    Has patient had a PCN reaction causing immediate rash, facial/tongue/throat swelling, SOB or lightheadedness with hypotension: Unknown Has patient had a PCN reaction causing severe rash involving mucus membranes or skin necrosis: Unknown Has patient had a PCN reaction that required hospitalization: Unknown Has patient had a PCN reaction occurring within the last 10 years: No If all of the above answers are "NO", then may proceed with Cephalosporin use.     Social History   Socioeconomic History  . Marital status: Widowed    Spouse name: Not on file  . Number of children: 4  . Years of  education: Not on file  . Highest education level: Not on file  Occupational History  . Occupation: retired  Scientific laboratory technician  . Financial resource strain: Not hard at all  . Food insecurity    Worry: Never true    Inability: Never true  . Transportation needs    Medical: No    Non-medical: No  Tobacco Use  . Smoking status: Never Smoker  . Smokeless tobacco: Never Used  Substance and Sexual Activity  . Alcohol use: No  . Drug use: No  . Sexual activity: Never  Lifestyle  . Physical activity    Days  per week: 5 days    Minutes per session: 40 min  . Stress: Not at all  Relationships  . Social connections    Talks on phone: More than three times a week    Gets together: More than three times a week    Attends religious service: More than 4 times per year    Active member of club or organization: Yes    Attends meetings of clubs or organizations: More than 4 times per year    Relationship status: Widowed  . Intimate partner violence    Fear of current or ex partner: Not on file    Emotionally abused: Not on file    Physically abused: Not on file    Forced sexual activity: Not on file  Other Topics Concern  . Not on file  Social History Narrative  . Not on file     ROS- All systems are reviewed and negative except as per the HPI above.  Physical Exam: Vitals:   12/05/18 1042  BP: 122/60  Pulse: 62  Weight: 78.7 kg  Height: 5\' 3"  (1.6 m)    GEN- The patient is well appearing elderly female, alert and oriented x 3 today.   Head- normocephalic, atraumatic Eyes-  Sclera clear, conjunctiva pink Ears- hearing intact Oropharynx- clear Neck- supple  Lungs- Clear to ausculation bilaterally, normal work of breathing Heart- Regular rate and rhythm, no murmurs, rubs or gallops  GI- soft, NT, ND, + BS Extremities- no clubbing, cyanosis, or edema MS- no significant deformity or atrophy Skin- no rash or lesion Psych- euthymic mood, full affect Neuro- strength and  sensation are intact  Wt Readings from Last 3 Encounters:  12/05/18 78.7 kg  11/25/18 79.7 kg  10/08/18 80.3 kg    EKG today demonstrates V-paced rhythm, underlying afib HR 62, QRS 160, QTc 483  Echo 06/28/18 demonstrated  - Left ventricle: The cavity size was normal. There was moderate   focal basal hypertrophy of the septum. Systolic function was   normal. The estimated ejection fraction was in the range of 60%   to 65%. Wall motion was normal; there were no regional wall   motion abnormalities. Doppler parameters are consistent with   abnormal left ventricular relaxation (grade 1 diastolic   dysfunction). - Aortic valve: Trileaflet; mildly thickened, mildly calcified   leaflets. There was trivial regurgitation. - Mitral valve: There was mild regurgitation. - Left atrium: Volume/bsa, S: 32.7 ml/m^2. - Tricuspid valve: There was mild regurgitation.  Impressions:  - EF is improved when compared to prior study.  Epic records are reviewed at length today  Assessment and Plan:  1. Persistent atrial fibrillation General education about afib provided and questions answered. We also discussed her stroke risk and the risks and benefits of anticoagulation. Will stop ASA and start Eliquis 5 mg BID. Recent labs reviewed. We also discussed therapeutic options for her afib including DCCV, AAD, and rate control. Patient would like to consider her options. She would need to be on 3 weeks of uninterrupted anticoagulation prior to attempting rhythm control anyway. I do think she would benefit from a trial of SR.   This patients CHA2DS2-VASc Score and unadjusted Ischemic Stroke Rate (% per year) is equal to 7.2 % stroke rate/year from a score of 5  Above score calculated as 1 point each if present [CHF, HTN, DM, Vascular=MI/PAD/Aortic Plaque, Age if 65-74, or Female] Above score calculated as 2 points each if present [Age > 75, or Stroke/TIA/TE]  2. Obesity Body mass index is 30.75  kg/m. Lifestyle modification was discussed at length including regular exercise and weight reduction.  3. CHB S/p PPM, followed by Dr Rayann Heman and device clinic.  4. HTN Stable, no change today.  5. Chronic diastolic dysfunction No signs or symptoms of fluid overload. Weight actually slightly decreased from previous visit.  6. CAD No anginal symptoms. Continue present therapy and risk factor modification.    Follow up in the AF clinic in 2 weeks.   [facility] [address] [place], [place] [address] [phone] 12/05/2018 12:27 PM

## 2018-12-05 NOTE — Patient Instructions (Signed)
Stop aspirin  Start Eliquis 5 mg twice a day.

## 2018-12-10 DIAGNOSIS — S42215A Unspecified nondisplaced fracture of surgical neck of left humerus, initial encounter for closed fracture: Secondary | ICD-10-CM | POA: Diagnosis not present

## 2018-12-19 ENCOUNTER — Ambulatory Visit (HOSPITAL_COMMUNITY)
Admission: RE | Admit: 2018-12-19 | Discharge: 2018-12-19 | Disposition: A | Discharge status: Home / Self Care | Payer: PPO | Source: Ambulatory Visit | Attending: Physician Assistant | Admitting: Physician Assistant

## 2018-12-19 ENCOUNTER — Other Ambulatory Visit: Payer: Self-pay

## 2018-12-19 VITALS — BP 136/64 | HR 68 | Ht 63.0 in | Wt 166.2 lb

## 2018-12-19 DIAGNOSIS — Z8249 Family history of ischemic heart disease and other diseases of the circulatory system: Secondary | ICD-10-CM | POA: Insufficient documentation

## 2018-12-19 DIAGNOSIS — E785 Hyperlipidemia, unspecified: Secondary | ICD-10-CM | POA: Diagnosis not present

## 2018-12-19 DIAGNOSIS — Z950 Presence of cardiac pacemaker: Secondary | ICD-10-CM | POA: Insufficient documentation

## 2018-12-19 DIAGNOSIS — I447 Left bundle-branch block, unspecified: Secondary | ICD-10-CM | POA: Insufficient documentation

## 2018-12-19 DIAGNOSIS — E039 Hypothyroidism, unspecified: Secondary | ICD-10-CM | POA: Insufficient documentation

## 2018-12-19 DIAGNOSIS — Z79890 Hormone replacement therapy (postmenopausal): Secondary | ICD-10-CM | POA: Diagnosis not present

## 2018-12-19 DIAGNOSIS — I2510 Atherosclerotic heart disease of native coronary artery without angina pectoris: Secondary | ICD-10-CM | POA: Insufficient documentation

## 2018-12-19 DIAGNOSIS — Z880 Allergy status to penicillin: Secondary | ICD-10-CM | POA: Diagnosis not present

## 2018-12-19 DIAGNOSIS — Z888 Allergy status to other drugs, medicaments and biological substances status: Secondary | ICD-10-CM | POA: Insufficient documentation

## 2018-12-19 DIAGNOSIS — Z79899 Other long term (current) drug therapy: Secondary | ICD-10-CM | POA: Diagnosis not present

## 2018-12-19 DIAGNOSIS — I509 Heart failure, unspecified: Secondary | ICD-10-CM | POA: Insufficient documentation

## 2018-12-19 DIAGNOSIS — Z91013 Allergy to seafood: Secondary | ICD-10-CM | POA: Diagnosis not present

## 2018-12-19 DIAGNOSIS — Z7901 Long term (current) use of anticoagulants: Secondary | ICD-10-CM | POA: Insufficient documentation

## 2018-12-19 DIAGNOSIS — Z885 Allergy status to narcotic agent status: Secondary | ICD-10-CM | POA: Insufficient documentation

## 2018-12-19 DIAGNOSIS — I110 Hypertensive heart disease with heart failure: Secondary | ICD-10-CM | POA: Insufficient documentation

## 2018-12-19 DIAGNOSIS — Z91040 Latex allergy status: Secondary | ICD-10-CM | POA: Insufficient documentation

## 2018-12-19 DIAGNOSIS — I4819 Other persistent atrial fibrillation: Secondary | ICD-10-CM | POA: Diagnosis not present

## 2018-12-19 LAB — BASIC METABOLIC PANEL
Anion gap: 12 (ref 5–15)
BUN: 20 mg/dL (ref 8–23)
CO2: 24 mmol/L (ref 22–32)
Calcium: 9.8 mg/dL (ref 8.9–10.3)
Chloride: 104 mmol/L (ref 98–111)
Creatinine, Ser: 1.17 mg/dL — ABNORMAL HIGH (ref 0.44–1.00)
GFR calc Af Amer: 47 mL/min — ABNORMAL LOW (ref >60)
GFR calc non Af Amer: 40 mL/min — ABNORMAL LOW (ref >60)
Glucose, Bld: 135 mg/dL — ABNORMAL HIGH (ref 70–99)
Potassium: 4.9 mmol/L (ref 3.5–5.1)
Sodium: 140 mmol/L (ref 135–145)

## 2018-12-19 LAB — CBC
HCT: 39.3 % (ref 36.0–46.0)
Hemoglobin: 12.9 g/dL (ref 12.0–15.0)
MCH: 32.7 pg (ref 26.0–34.0)
MCHC: 32.8 g/dL (ref 30.0–36.0)
MCV: 99.7 fL (ref 80.0–100.0)
Platelets: 188 10*3/uL (ref 150–400)
RBC: 3.94 MIL/uL (ref 3.87–5.11)
RDW: 13.8 % (ref 11.5–15.5)
WBC: 7.6 10*3/uL (ref 4.0–10.5)
nRBC: 0 % (ref 0.0–0.2)

## 2018-12-19 MED ORDER — RIVAROXABAN 15 MG PO TABS: 15.0000 mg | ORAL_TABLET | Freq: Every day | ORAL | 3 refills | Status: DC

## 2018-12-19 NOTE — Progress Notes (Signed)
Primary Care Physician: [name], MD Primary Cardiologist: Dr Percival Spanish Primary Electrophysiologist: Dr Rayann Heman Referring Physician: Dr Calla Kicks is a 85 y.o. female with a history of CHB, non obstructive CAD, HTN, HLD, hearing loss, hypothyroidism, and persistent atrial fibrillation who presents for follow up in the [facility].  The patient was initially diagnosed with atrial fibrillation remotely but AF burden on her device interrogation has previously always been <1%. However, she appears to be in persistent afib since 11/16/18 per device clinic. She does have symptoms of increased fatigue and intermittent palpitations. She was advised to stop ASA and start Eliquis. She denies alcohol use or significant snoring. There were no specific triggers that she could identify but she does note she has been under a lot of stress recently.  On follow up today, patient reports that she has had symptoms of headache and nausea since starting the Eliquis. Her fatigue has been persistent. No bleeding issues with anticoagulation.   Today, she denies symptoms of chest pain, shortness of breath, orthopnea, PND, lower extremity edema, dizziness, presyncope, syncope, snoring, daytime somnolence, bleeding, or neurologic sequela. The patient is tolerating medications without difficulties and is otherwise without complaint today. +fatigue   Atrial Fibrillation Risk Factors:  she does not have symptoms or diagnosis of sleep apnea. she does not have a history of alcohol use. The patient does have a history of early familial atrial fibrillation or other arrhythmias. Son has afib.  she has a BMI of Body mass index is 29.44 kg/m.Marland Kitchen Filed Weights   12/19/18 1149  Weight: 75.4 kg    Family History  Problem Relation Age of Onset  . Coronary artery disease Other        family hx of 1st degree relative <50  . Diabetes Other        family hx of  . Other Other     cardiovascular disorder family hx of  . Other Other        neurological disorder family hx of  . Other Other        respiratory disease family hx of  . Heart attack Daughter   . Heart Problems Other        all children  . Sudden death Son   . Heart disease Brother   . Heart disease Sister   . Colon cancer Neg Hx   . Colon polyps Neg Hx      Atrial Fibrillation Management history:  Previous antiarrhythmic drugs: none Previous cardioversions: none Previous ablations: none CHADS2VASC score: 5 Anticoagulation history: Eliquis   Past Medical History:  Diagnosis Date  . Atrial fibrillation ([place]) 08/16/2016   observed on PPM interrogation, asymptomatic, chad2vasc score is 4.  will consider anticoagulation  . CHF (congestive heart failure) ([place])   . Colon polyps   . Coronary artery disease   . Diverticulosis   . Dyspnea   . GERD (gastroesophageal reflux disease)   . Hearing loss of both ears   . Hepatic cyst   . Hiatal hernia   . Hyperlipidemia   . Hypertension   . Hypothyroidism   . Low back pain   . Mitral valve disorders(424.0)   . Other left bundle branch block   . Palpitations   . URI (upper respiratory infection)    Past Surgical History:  Procedure Laterality Date  . CARDIAC CATHETERIZATION  04-18-01  . COLONOSCOPY    . HERNIA REPAIR    . PACEMAKER IMPLANT  Left 05/15/2016   SJM Assirity MRI dual chamber PPM implanted by Dr Rayann Heman for CHB  . THYROIDECTOMY    . TONSILLECTOMY AND ADENOIDECTOMY    . TOTAL ABDOMINAL HYSTERECTOMY W/ BILATERAL SALPINGOOPHORECTOMY      Current Outpatient Medications  Medication Sig Dispense Refill  . albuterol (PROVENTIL HFA;VENTOLIN HFA) 108 (90 Base) MCG/ACT inhaler Inhale 1-2 puffs into the lungs every 6 (six) hours as needed for wheezing or shortness of breath. 1 Inhaler 0  . BIOTIN PO Take 1 tablet by mouth daily.    . bisoprolol (ZEBETA) 5 MG tablet Take 1 tablet (5 mg total) by mouth daily. 90 tablet 1  . Calcium  Carb-Cholecalciferol (CALCIUM/VITAMIN D PO) Take by mouth. Takes two tablets daily 1200mg     . calcium carbonate (TUMS - DOSED IN MG ELEMENTAL CALCIUM) 500 MG chewable tablet Chew 1 tablet by mouth daily as needed for indigestion or heartburn.    . furosemide (LASIX) 40 MG tablet Take 1 tablet (40 mg total) by mouth daily. 90 tablet 3  . ipratropium (ATROVENT) 0.03 % nasal spray Place into the nose.    . metolazone (ZAROXOLYN) 2.5 MG tablet TAKE ONE TABLET BY MOUTH THREE TIMES A WEEK 30 tablet 5  . Multiple Vitamins-Minerals (CENTRUM SILVER PO) Take 1 tablet by mouth daily.    Marland Kitchen nystatin (NYSTATIN) powder Nystop 100,000 unit/gram topical powder  APPLY POWDER TOPICALLY TO AFFECTED AREA TWICE DAILY    . nystatin cream (MYCOSTATIN) Apply 1 application topically 2 (two) times daily. 30 g 5  . Omega-3 Fatty Acids (FISH OIL PO) Take 1 capsule by mouth daily.    Marland Kitchen SYNTHROID 88 MCG tablet TAKE 1 TABLET BY MOUTH ONCE DAILY BEFORE BREAKFAST 90 tablet 1  . vitamin C (ASCORBIC ACID) 500 MG tablet Take 500 mg by mouth daily.    . Rivaroxaban (XARELTO) 15 MG TABS tablet Take 1 tablet (15 mg total) by mouth daily with supper. 30 tablet 3   No current facility-administered medications for this encounter.     Allergies  Allergen Reactions  . Adhesive [Tape] Itching, Dermatitis, Rash and Other (See Comments)    Blisters and "skin bubbles"  . Ace Inhibitors Cough  . Atorvastatin Other (See Comments)    REACTION: Reaction not known  . Clindamycin Other (See Comments)    Unknown  . Codeine Other (See Comments)    hallucinations   . Latex Other (See Comments)    blisters  . Shellfish Allergy Other (See Comments)    "gallblater attack"  . Simvastatin Other (See Comments)    REACTION: fatigue  . Penicillins Rash    Has patient had a PCN reaction causing immediate rash, facial/tongue/throat swelling, SOB or lightheadedness with hypotension: Unknown Has patient had a PCN reaction causing severe rash  involving mucus membranes or skin necrosis: Unknown Has patient had a PCN reaction that required hospitalization: Unknown Has patient had a PCN reaction occurring within the last 10 years: No If all of the above answers are "NO", then may proceed with Cephalosporin use.     Social History   Socioeconomic History  . Marital status: Widowed    Spouse name: Not on file  . Number of children: 4  . Years of education: Not on file  . Highest education level: Not on file  Occupational History  . Occupation: retired  Scientific laboratory technician  . Financial resource strain: Not hard at all  . Food insecurity    Worry: Never true    Inability: Never  true  . Transportation needs    Medical: No    Non-medical: No  Tobacco Use  . Smoking status: Never Smoker  . Smokeless tobacco: Never Used  Substance and Sexual Activity  . Alcohol use: No  . Drug use: No  . Sexual activity: Never  Lifestyle  . Physical activity    Days per week: 5 days    Minutes per session: 40 min  . Stress: Not at all  Relationships  . Social connections    Talks on phone: More than three times a week    Gets together: More than three times a week    Attends religious service: More than 4 times per year    Active member of club or organization: Yes    Attends meetings of clubs or organizations: More than 4 times per year    Relationship status: Widowed  . Intimate partner violence    Fear of current or ex partner: Not on file    Emotionally abused: Not on file    Physically abused: Not on file    Forced sexual activity: Not on file  Other Topics Concern  . Not on file  Social History Narrative  . Not on file     ROS- All systems are reviewed and negative except as per the HPI above.  Physical Exam: Vitals:   12/19/18 1149  BP: 136/64  Pulse: 68  Weight: 75.4 kg  Height: 5\' 3"  (1.6 m)    GEN- The patient is well appearing elderly female, alert and oriented x 3 today.   HEENT-head normocephalic,  atraumatic, sclera clear, conjunctiva pink, hearing intact, trachea midline. Lungs- Clear to ausculation bilaterally, normal work of breathing Heart- Regular rate and rhythm, no murmurs, rubs or gallops  GI- soft, NT, ND, + BS Extremities- no clubbing, cyanosis, or edema MS- no significant deformity or atrophy Skin- no rash or lesion Psych- euthymic mood, full affect Neuro- strength and sensation are intact   Wt Readings from Last 3 Encounters:  12/19/18 75.4 kg  12/05/18 78.7 kg  11/25/18 79.7 kg    EKG today demonstrates V paced rhythm HR 68, PVC, QRS 156, QTc 499  Echo 06/28/18 demonstrated  - Left ventricle: The cavity size was normal. There was moderate   focal basal hypertrophy of the septum. Systolic function was   normal. The estimated ejection fraction was in the range of 60%   to 65%. Wall motion was normal; there were no regional wall   motion abnormalities. Doppler parameters are consistent with   abnormal left ventricular relaxation (grade 1 diastolic   dysfunction). - Aortic valve: Trileaflet; mildly thickened, mildly calcified   leaflets. There was trivial regurgitation. - Mitral valve: There was mild regurgitation. - Left atrium: Volume/bsa, S: 32.7 ml/m^2. - Tricuspid valve: There was mild regurgitation.  Impressions:  - EF is improved when compared to prior study.  Epic records are reviewed at length today  Assessment and Plan:  1. Persistent atrial fibrillation We discussed therapeutic options for her afib again today including DCCV, AAD, and rate control. Patient and her family would like to proceed with DCCV. She would like to avoid new medications given her propensity for side effects. Will change Eliquis to Xarelto 15 mg daily (CrCl <10mLmin). Will have her take at her next scheduled dose of Eliquis so there will be no gap in anticoagulation. Will arrange for DCCV after three weeks of uninterrupted anticoagulation.  Bmet/CBC today.  This patients  CHA2DS2-VASc Score and unadjusted Ischemic  Stroke Rate (% per year) is equal to 7.2 % stroke rate/year from a score of 5  Above score calculated as 1 point each if present [CHF, HTN, DM, Vascular=MI/PAD/Aortic Plaque, Age if 65-74, or Female] Above score calculated as 2 points each if present [Age > 75, or Stroke/TIA/TE]   2. CHB S/p PPM, followed by Dr Rayann Heman and the device clinic.   3. HTN Stable, no changes today.  4. Chronic diastolic dysfunction No signs or symptoms of fluid overload.  5. CAD No anginal symptoms. Continue present therapy and risk factor modification.   Follow up one week post DCCV.   [facility] [address] [place], [place] [address] [phone] 12/19/2018 12:37 PM

## 2018-12-19 NOTE — Patient Instructions (Signed)
Cardioversion scheduled for Friday, October 30th  - Arrive at the [facility] and go to admitting at 9:30am  -Do not eat or drink anything after midnight the night prior to your procedure.  - Take all your morning medication with a sip of water prior to arrival.  - You will not be able to drive home after your procedure.   STOP eliquis  START Xarelto 15mg  ONCE A DAY with supper.

## 2018-12-19 NOTE — H&P (View-Only) (Signed)
Primary Care Physician: [name], MD Primary Cardiologist: Dr Percival Spanish Primary Electrophysiologist: Dr Rayann Heman Referring Physician: Dr Calla Kicks is a 85 y.o. female with a history of CHB, non obstructive CAD, HTN, HLD, hearing loss, hypothyroidism, and persistent atrial fibrillation who presents for follow up in the [facility].  The patient was initially diagnosed with atrial fibrillation remotely but AF burden on her device interrogation has previously always been <1%. However, she appears to be in persistent afib since 11/16/18 per device clinic. She does have symptoms of increased fatigue and intermittent palpitations. She was advised to stop ASA and start Eliquis. She denies alcohol use or significant snoring. There were no specific triggers that she could identify but she does note she has been under a lot of stress recently.  On follow up today, patient reports that she has had symptoms of headache and nausea since starting the Eliquis. Her fatigue has been persistent. No bleeding issues with anticoagulation.   Today, she denies symptoms of chest pain, shortness of breath, orthopnea, PND, lower extremity edema, dizziness, presyncope, syncope, snoring, daytime somnolence, bleeding, or neurologic sequela. The patient is tolerating medications without difficulties and is otherwise without complaint today. +fatigue   Atrial Fibrillation Risk Factors:  she does not have symptoms or diagnosis of sleep apnea. she does not have a history of alcohol use. The patient does have a history of early familial atrial fibrillation or other arrhythmias. Son has afib.  she has a BMI of Body mass index is 29.44 kg/m.Marland Kitchen Filed Weights   12/19/18 1149  Weight: 75.4 kg    Family History  Problem Relation Age of Onset  . Coronary artery disease Other        family hx of 1st degree relative <50  . Diabetes Other        family hx of  . Other Other     cardiovascular disorder family hx of  . Other Other        neurological disorder family hx of  . Other Other        respiratory disease family hx of  . Heart attack Daughter   . Heart Problems Other        all children  . Sudden death Son   . Heart disease Brother   . Heart disease Sister   . Colon cancer Neg Hx   . Colon polyps Neg Hx      Atrial Fibrillation Management history:  Previous antiarrhythmic drugs: none Previous cardioversions: none Previous ablations: none CHADS2VASC score: 5 Anticoagulation history: Eliquis   Past Medical History:  Diagnosis Date  . Atrial fibrillation ([place]) 08/16/2016   observed on PPM interrogation, asymptomatic, chad2vasc score is 4.  will consider anticoagulation  . CHF (congestive heart failure) ([place])   . Colon polyps   . Coronary artery disease   . Diverticulosis   . Dyspnea   . GERD (gastroesophageal reflux disease)   . Hearing loss of both ears   . Hepatic cyst   . Hiatal hernia   . Hyperlipidemia   . Hypertension   . Hypothyroidism   . Low back pain   . Mitral valve disorders(424.0)   . Other left bundle branch block   . Palpitations   . URI (upper respiratory infection)    Past Surgical History:  Procedure Laterality Date  . CARDIAC CATHETERIZATION  04-18-01  . COLONOSCOPY    . HERNIA REPAIR    . PACEMAKER IMPLANT  Left 05/15/2016   SJM Assirity MRI dual chamber PPM implanted by Dr Rayann Heman for CHB  . THYROIDECTOMY    . TONSILLECTOMY AND ADENOIDECTOMY    . TOTAL ABDOMINAL HYSTERECTOMY W/ BILATERAL SALPINGOOPHORECTOMY      Current Outpatient Medications  Medication Sig Dispense Refill  . albuterol (PROVENTIL HFA;VENTOLIN HFA) 108 (90 Base) MCG/ACT inhaler Inhale 1-2 puffs into the lungs every 6 (six) hours as needed for wheezing or shortness of breath. 1 Inhaler 0  . BIOTIN PO Take 1 tablet by mouth daily.    . bisoprolol (ZEBETA) 5 MG tablet Take 1 tablet (5 mg total) by mouth daily. 90 tablet 1  . Calcium  Carb-Cholecalciferol (CALCIUM/VITAMIN D PO) Take by mouth. Takes two tablets daily 1200mg     . calcium carbonate (TUMS - DOSED IN MG ELEMENTAL CALCIUM) 500 MG chewable tablet Chew 1 tablet by mouth daily as needed for indigestion or heartburn.    . furosemide (LASIX) 40 MG tablet Take 1 tablet (40 mg total) by mouth daily. 90 tablet 3  . ipratropium (ATROVENT) 0.03 % nasal spray Place into the nose.    . metolazone (ZAROXOLYN) 2.5 MG tablet TAKE ONE TABLET BY MOUTH THREE TIMES A WEEK 30 tablet 5  . Multiple Vitamins-Minerals (CENTRUM SILVER PO) Take 1 tablet by mouth daily.    Marland Kitchen nystatin (NYSTATIN) powder Nystop 100,000 unit/gram topical powder  APPLY POWDER TOPICALLY TO AFFECTED AREA TWICE DAILY    . nystatin cream (MYCOSTATIN) Apply 1 application topically 2 (two) times daily. 30 g 5  . Omega-3 Fatty Acids (FISH OIL PO) Take 1 capsule by mouth daily.    Marland Kitchen SYNTHROID 88 MCG tablet TAKE 1 TABLET BY MOUTH ONCE DAILY BEFORE BREAKFAST 90 tablet 1  . vitamin C (ASCORBIC ACID) 500 MG tablet Take 500 mg by mouth daily.    . Rivaroxaban (XARELTO) 15 MG TABS tablet Take 1 tablet (15 mg total) by mouth daily with supper. 30 tablet 3   No current facility-administered medications for this encounter.     Allergies  Allergen Reactions  . Adhesive [Tape] Itching, Dermatitis, Rash and Other (See Comments)    Blisters and "skin bubbles"  . Ace Inhibitors Cough  . Atorvastatin Other (See Comments)    REACTION: Reaction not known  . Clindamycin Other (See Comments)    Unknown  . Codeine Other (See Comments)    hallucinations   . Latex Other (See Comments)    blisters  . Shellfish Allergy Other (See Comments)    "gallblater attack"  . Simvastatin Other (See Comments)    REACTION: fatigue  . Penicillins Rash    Has patient had a PCN reaction causing immediate rash, facial/tongue/throat swelling, SOB or lightheadedness with hypotension: Unknown Has patient had a PCN reaction causing severe rash  involving mucus membranes or skin necrosis: Unknown Has patient had a PCN reaction that required hospitalization: Unknown Has patient had a PCN reaction occurring within the last 10 years: No If all of the above answers are "NO", then may proceed with Cephalosporin use.     Social History   Socioeconomic History  . Marital status: Widowed    Spouse name: Not on file  . Number of children: 4  . Years of education: Not on file  . Highest education level: Not on file  Occupational History  . Occupation: retired  Scientific laboratory technician  . Financial resource strain: Not hard at all  . Food insecurity    Worry: Never true    Inability: Never  true  . Transportation needs    Medical: No    Non-medical: No  Tobacco Use  . Smoking status: Never Smoker  . Smokeless tobacco: Never Used  Substance and Sexual Activity  . Alcohol use: No  . Drug use: No  . Sexual activity: Never  Lifestyle  . Physical activity    Days per week: 5 days    Minutes per session: 40 min  . Stress: Not at all  Relationships  . Social connections    Talks on phone: More than three times a week    Gets together: More than three times a week    Attends religious service: More than 4 times per year    Active member of club or organization: Yes    Attends meetings of clubs or organizations: More than 4 times per year    Relationship status: Widowed  . Intimate partner violence    Fear of current or ex partner: Not on file    Emotionally abused: Not on file    Physically abused: Not on file    Forced sexual activity: Not on file  Other Topics Concern  . Not on file  Social History Narrative  . Not on file     ROS- All systems are reviewed and negative except as per the HPI above.  Physical Exam: Vitals:   12/19/18 1149  BP: 136/64  Pulse: 68  Weight: 75.4 kg  Height: 5\' 3"  (1.6 m)    GEN- The patient is well appearing elderly female, alert and oriented x 3 today.   HEENT-head normocephalic,  atraumatic, sclera clear, conjunctiva pink, hearing intact, trachea midline. Lungs- Clear to ausculation bilaterally, normal work of breathing Heart- Regular rate and rhythm, no murmurs, rubs or gallops  GI- soft, NT, ND, + BS Extremities- no clubbing, cyanosis, or edema MS- no significant deformity or atrophy Skin- no rash or lesion Psych- euthymic mood, full affect Neuro- strength and sensation are intact   Wt Readings from Last 3 Encounters:  12/19/18 75.4 kg  12/05/18 78.7 kg  11/25/18 79.7 kg    EKG today demonstrates V paced rhythm HR 68, PVC, QRS 156, QTc 499  Echo 06/28/18 demonstrated  - Left ventricle: The cavity size was normal. There was moderate   focal basal hypertrophy of the septum. Systolic function was   normal. The estimated ejection fraction was in the range of 60%   to 65%. Wall motion was normal; there were no regional wall   motion abnormalities. Doppler parameters are consistent with   abnormal left ventricular relaxation (grade 1 diastolic   dysfunction). - Aortic valve: Trileaflet; mildly thickened, mildly calcified   leaflets. There was trivial regurgitation. - Mitral valve: There was mild regurgitation. - Left atrium: Volume/bsa, S: 32.7 ml/m^2. - Tricuspid valve: There was mild regurgitation.  Impressions:  - EF is improved when compared to prior study.  Epic records are reviewed at length today  Assessment and Plan:  1. Persistent atrial fibrillation We discussed therapeutic options for her afib again today including DCCV, AAD, and rate control. Patient and her family would like to proceed with DCCV. She would like to avoid new medications given her propensity for side effects. Will change Eliquis to Xarelto 15 mg daily (CrCl <6mLmin). Will have her take at her next scheduled dose of Eliquis so there will be no gap in anticoagulation. Will arrange for DCCV after three weeks of uninterrupted anticoagulation.  Bmet/CBC today.  This patients  CHA2DS2-VASc Score and unadjusted Ischemic  Stroke Rate (% per year) is equal to 7.2 % stroke rate/year from a score of 5  Above score calculated as 1 point each if present [CHF, HTN, DM, Vascular=MI/PAD/Aortic Plaque, Age if 65-74, or Female] Above score calculated as 2 points each if present [Age > 75, or Stroke/TIA/TE]   2. CHB S/p PPM, followed by Dr Rayann Heman and the device clinic.   3. HTN Stable, no changes today.  4. Chronic diastolic dysfunction No signs or symptoms of fluid overload.  5. CAD No anginal symptoms. Continue present therapy and risk factor modification.   Follow up one week post DCCV.   [facility] [address] [place], [place] [address] [phone] 12/19/2018 12:37 PM

## 2018-12-26 ENCOUNTER — Telehealth (HOSPITAL_COMMUNITY): Payer: Self-pay | Admitting: *Deleted

## 2018-12-26 NOTE — Telephone Encounter (Signed)
Patient's daughter called in stating pt missed a dose of eliquis last on 10/15 - she has since changed to [place] without missed doses. Cardioversion for 10/30 rescheduled to 11/13. Daughter verbalized understanding of instructions.

## 2018-12-30 ENCOUNTER — Other Ambulatory Visit (HOSPITAL_COMMUNITY): Payer: PPO

## 2018-12-31 IMAGING — CT CT RENAL STONE PROTOCOL
2 of 4 series · 17 of 46 positions shown, 19 images · non-contrast
Comparison: 07/11/2009

CLINICAL DATA: Right flank pain

EXAM:
CT ABDOMEN AND PELVIS WITHOUT CONTRAST
TECHNIQUE: Multidetector CT imaging of the abdomen and pelvis was performed
following the standard protocol without IV contrast.

[Series 3: ap without · axial · non-contrast · 0.76mm/px · z∈[+806,+1166]mm · 14 of 84 slices shown, 16 images]
[im 6/84  soft-tissue]
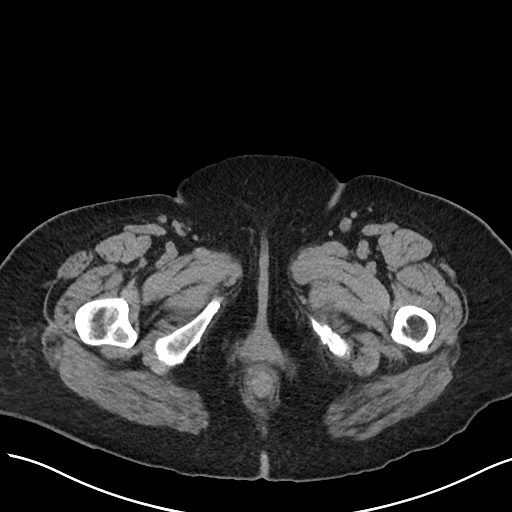
[im 6/84  bone]
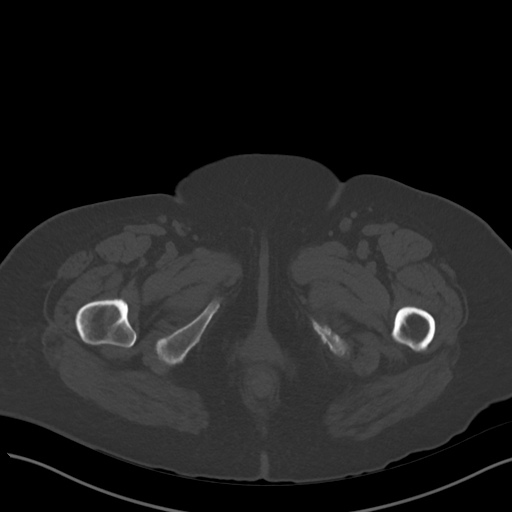
[im 11/84  soft-tissue]
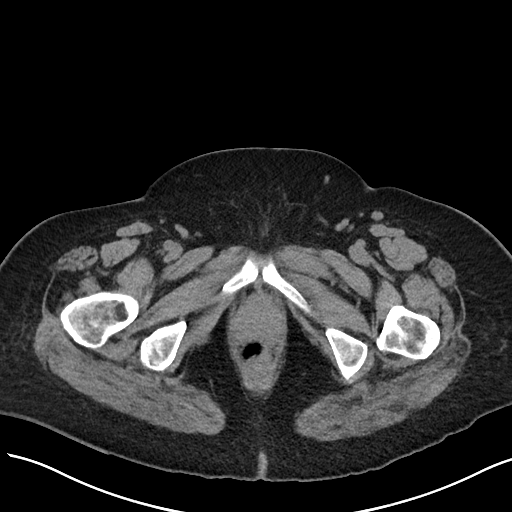
[im 16/84  soft-tissue]
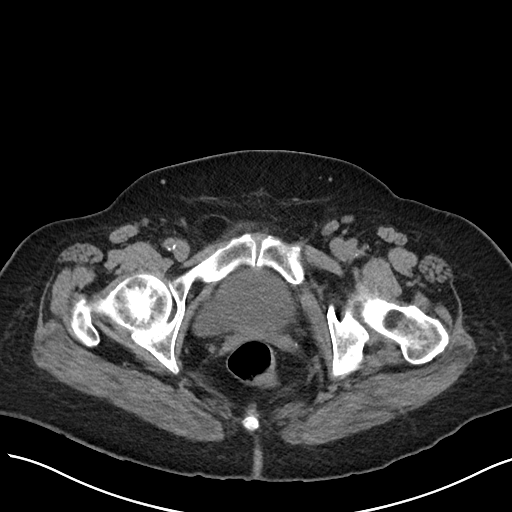
[im 21/84  soft-tissue]
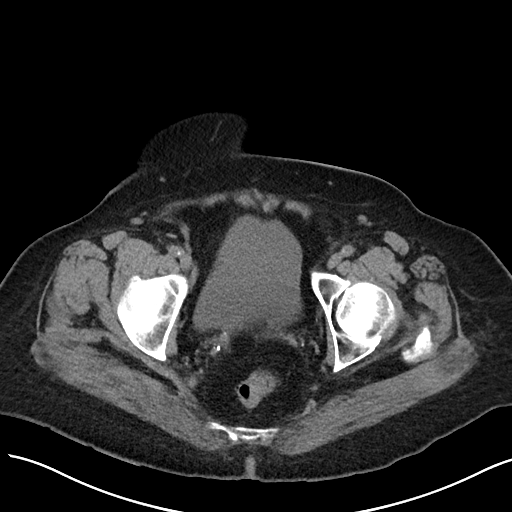
[im 26/84  soft-tissue]
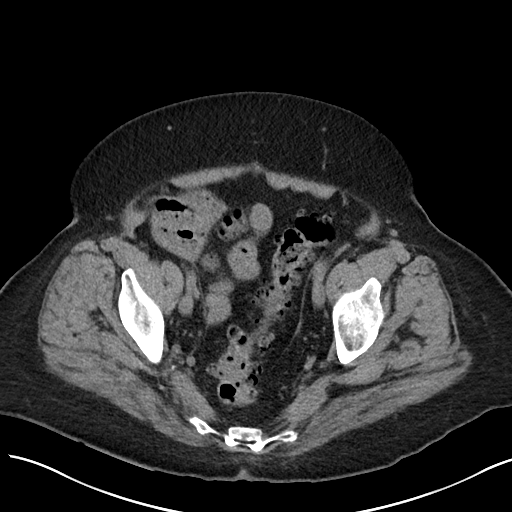
[im 32/84  soft-tissue]
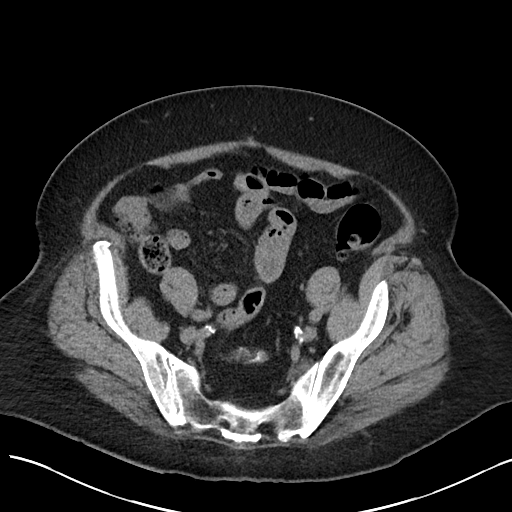
[im 37/84  soft-tissue]
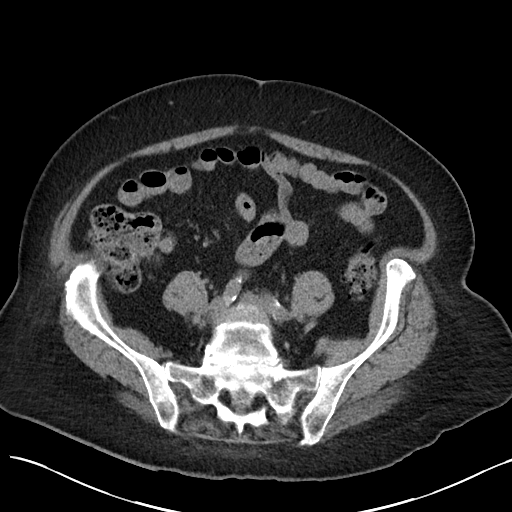
[im 47/84  soft-tissue]
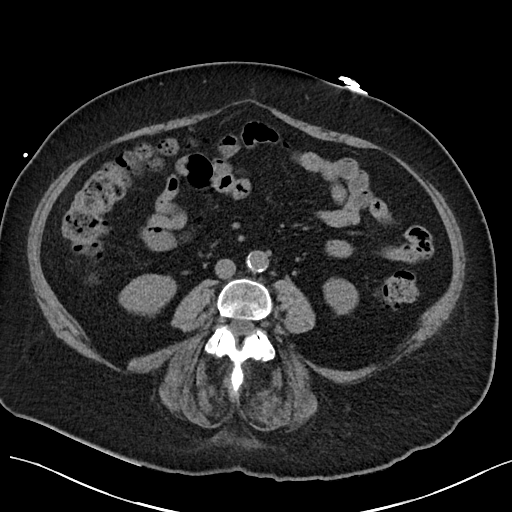
[im 52/84  soft-tissue]
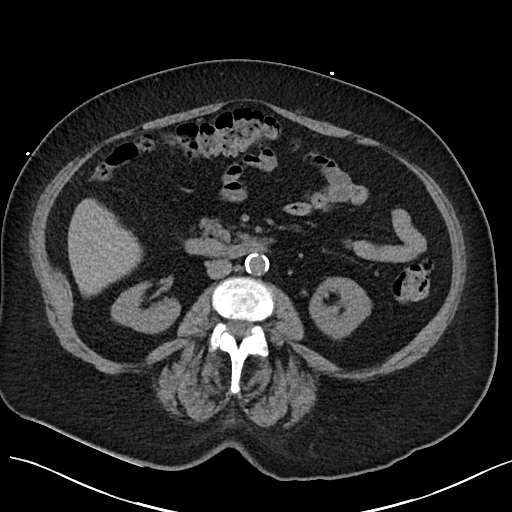
[im 52/84  bone]
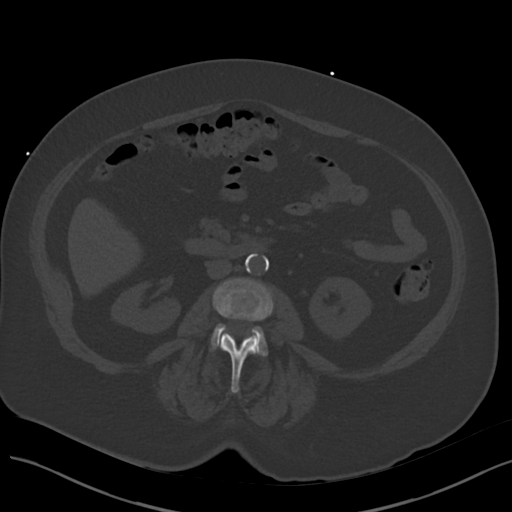
[im 58/84  soft-tissue]
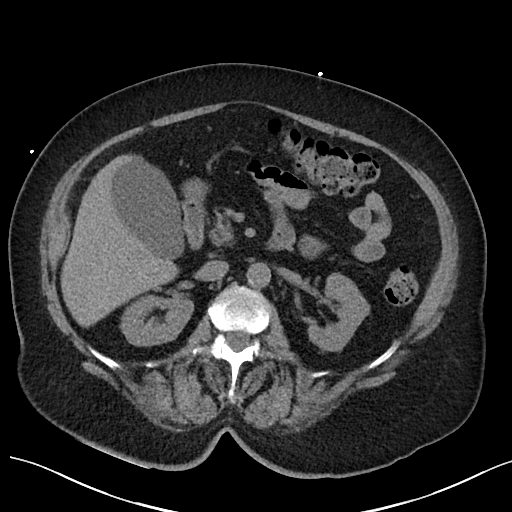
[im 63/84  soft-tissue]
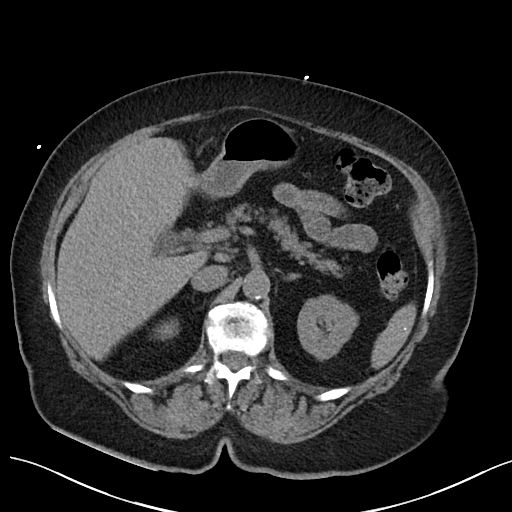
[im 68/84  soft-tissue]
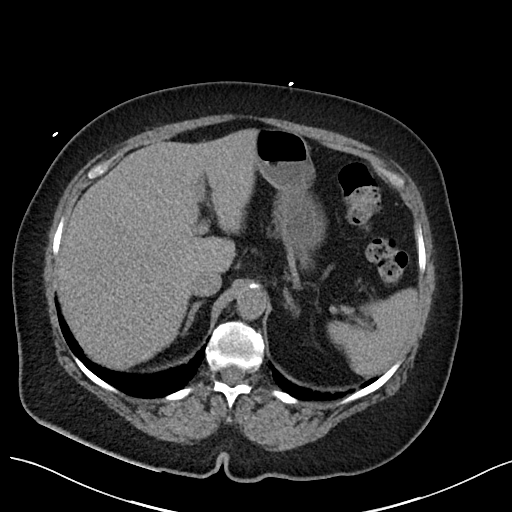
[im 73/84  soft-tissue]
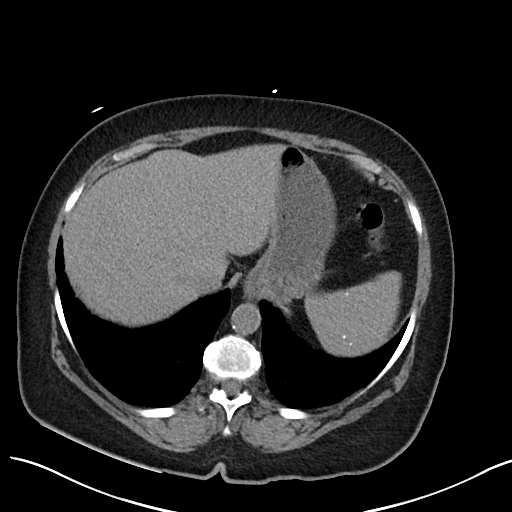
[im 78/84  soft-tissue]
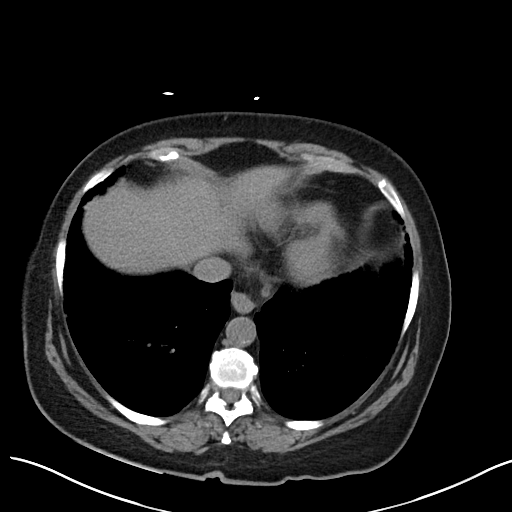

[Series 6: cor · coronal · 0.79mm/px · 3 of 105 slices shown]
[im 35/105  soft-tissue]
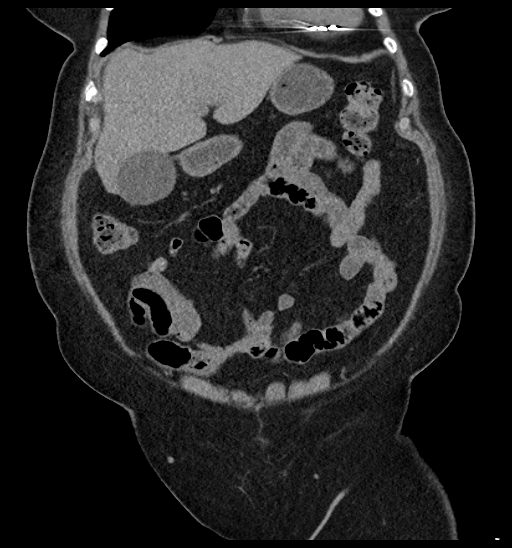
[im 47/105  soft-tissue]
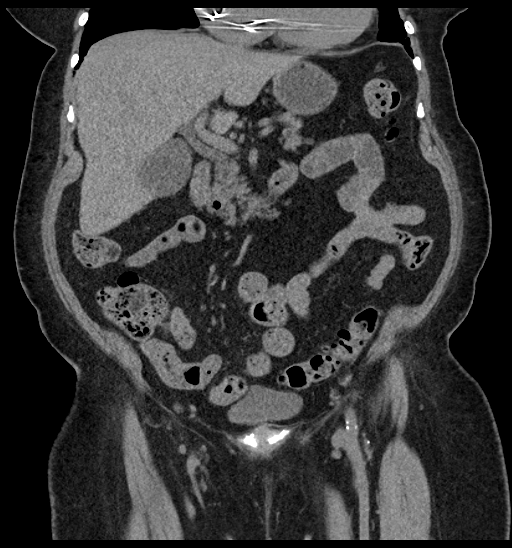
[im 58/105  soft-tissue]
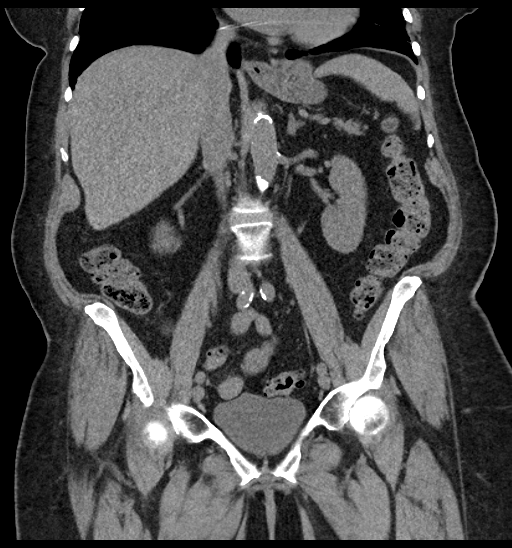

[17 of 46 positions shown; findings below may reference images not displayed]

FINDINGS: Lower chest: Cardiomegaly. Pacer wires noted in the right heart. No
acute abnormality.

Hepatobiliary: Gallbladder is mildly distended. No visible stones.
No focal hepatic abnormality.

Pancreas: No focal abnormality or ductal dilatation.

Spleen: Calcifications throughout the spleen.  Normal size.

Adrenals/Urinary Tract: No renal or ureteral stones. No
hydronephrosis. Urinary bladder and adrenal glands unremarkable.

Stomach/Bowel: Descending colonic and sigmoid diverticulosis. No
active diverticulitis. Stomach, large and small bowel grossly
unremarkable.

Vascular/Lymphatic: Aortic atherosclerosis. No enlarged abdominal or
pelvic lymph nodes.

Reproductive: Prior hysterectomy.  No adnexal masses.

Other: No free fluid or free air.

Musculoskeletal: No acute bony abnormality. Degenerative changes in
the lumbar spine. Grade 1 anterolisthesis of L5 on S1 related to
facet disease.
IMPRESSION: No renal or ureteral stones.  No hydronephrosis.

Gallbladder is mildly distended without visible stones or wall
thickening. Recommend clinical correlation. Consider right upper
quadrant ultrasound if further evaluation is felt warranted.

Old granulomatous disease in the spleen.

Left colonic diverticulosis.  No active diverticulitis.

Aortic atherosclerosis.

## 2019-01-05 ENCOUNTER — Other Ambulatory Visit: Payer: Self-pay | Admitting: Internal Medicine

## 2019-01-09 ENCOUNTER — Ambulatory Visit (HOSPITAL_COMMUNITY): Payer: PPO | Admitting: Physician Assistant

## 2019-01-13 ENCOUNTER — Other Ambulatory Visit (HOSPITAL_COMMUNITY)
Admission: RE | Admit: 2019-01-13 | Discharge: 2019-01-13 | Disposition: A | Discharge status: Home / Self Care | Payer: PPO | Source: Ambulatory Visit | Attending: Cardiovascular Disease | Admitting: Cardiovascular Disease

## 2019-01-13 DIAGNOSIS — Z20828 Contact with and (suspected) exposure to other viral communicable diseases: Secondary | ICD-10-CM | POA: Insufficient documentation

## 2019-01-13 DIAGNOSIS — Z01812 Encounter for preprocedural laboratory examination: Secondary | ICD-10-CM | POA: Insufficient documentation

## 2019-01-15 LAB — NOVEL CORONAVIRUS, NAA (HOSP ORDER, SEND-OUT TO REF LAB; TAT 18-24 HRS): SARS-CoV-2, NAA: NOT DETECTED

## 2019-01-16 ENCOUNTER — Other Ambulatory Visit: Payer: Self-pay

## 2019-01-16 ENCOUNTER — Ambulatory Visit (HOSPITAL_COMMUNITY)
Admission: RE | Admit: 2019-01-16 | Discharge: 2019-01-16 | Disposition: A | Discharge status: Home / Self Care | Payer: PPO | Source: Ambulatory Visit | Attending: Physician Assistant | Admitting: Physician Assistant

## 2019-01-16 ENCOUNTER — Encounter (HOSPITAL_COMMUNITY): Payer: Self-pay | Admitting: *Deleted

## 2019-01-16 ENCOUNTER — Ambulatory Visit (HOSPITAL_COMMUNITY): Payer: PPO | Admitting: Certified Registered Nurse Anesthetist

## 2019-01-16 ENCOUNTER — Ambulatory Visit (HOSPITAL_COMMUNITY)
Admission: RE | Admit: 2019-01-16 | Discharge: 2019-01-16 | Disposition: A | Discharge status: Home / Self Care | Payer: PPO | Attending: Cardiovascular Disease | Admitting: Cardiovascular Disease

## 2019-01-16 ENCOUNTER — Encounter (HOSPITAL_COMMUNITY)
Admission: RE | Disposition: A | Discharge status: Home / Self Care | Payer: Self-pay | Source: Home / Self Care | Attending: Cardiovascular Disease

## 2019-01-16 DIAGNOSIS — I4819 Other persistent atrial fibrillation: Secondary | ICD-10-CM | POA: Diagnosis not present

## 2019-01-16 DIAGNOSIS — I110 Hypertensive heart disease with heart failure: Secondary | ICD-10-CM | POA: Insufficient documentation

## 2019-01-16 DIAGNOSIS — E039 Hypothyroidism, unspecified: Secondary | ICD-10-CM | POA: Insufficient documentation

## 2019-01-16 DIAGNOSIS — Z79899 Other long term (current) drug therapy: Secondary | ICD-10-CM | POA: Insufficient documentation

## 2019-01-16 DIAGNOSIS — I2510 Atherosclerotic heart disease of native coronary artery without angina pectoris: Secondary | ICD-10-CM | POA: Diagnosis not present

## 2019-01-16 DIAGNOSIS — I509 Heart failure, unspecified: Secondary | ICD-10-CM | POA: Diagnosis not present

## 2019-01-16 DIAGNOSIS — K219 Gastro-esophageal reflux disease without esophagitis: Secondary | ICD-10-CM | POA: Insufficient documentation

## 2019-01-16 DIAGNOSIS — Z7901 Long term (current) use of anticoagulants: Secondary | ICD-10-CM | POA: Diagnosis not present

## 2019-01-16 DIAGNOSIS — E785 Hyperlipidemia, unspecified: Secondary | ICD-10-CM | POA: Diagnosis not present

## 2019-01-16 DIAGNOSIS — I4891 Unspecified atrial fibrillation: Secondary | ICD-10-CM | POA: Diagnosis not present

## 2019-01-16 DIAGNOSIS — Z79890 Hormone replacement therapy (postmenopausal): Secondary | ICD-10-CM | POA: Insufficient documentation

## 2019-01-16 DIAGNOSIS — I5033 Acute on chronic diastolic (congestive) heart failure: Secondary | ICD-10-CM | POA: Diagnosis not present

## 2019-01-16 HISTORY — PX: CARDIOVERSION: SHX1299

## 2019-01-16 LAB — BASIC METABOLIC PANEL
Anion gap: 25 — ABNORMAL HIGH (ref 5–15)
BUN: 27 mg/dL — ABNORMAL HIGH (ref 8–23)
CO2: 16 mmol/L — ABNORMAL LOW (ref 22–32)
Calcium: 10.7 mg/dL — ABNORMAL HIGH (ref 8.9–10.3)
Chloride: 104 mmol/L (ref 98–111)
Creatinine, Ser: 1.07 mg/dL — ABNORMAL HIGH (ref 0.44–1.00)
GFR calc Af Amer: 52 mL/min — ABNORMAL LOW (ref >60)
GFR calc non Af Amer: 45 mL/min — ABNORMAL LOW (ref >60)
Glucose, Bld: 131 mg/dL — ABNORMAL HIGH (ref 70–99)
Potassium: 4.7 mmol/L (ref 3.5–5.1)
Sodium: 145 mmol/L (ref 135–145)

## 2019-01-16 LAB — POCT I-STAT, CHEM 8
BUN: 37 mg/dL — ABNORMAL HIGH (ref 8–23)
Calcium, Ion: 1.15 mmol/L (ref 1.15–1.40)
Chloride: 97 mmol/L — ABNORMAL LOW (ref 98–111)
Creatinine, Ser: 1 mg/dL (ref 0.44–1.00)
Glucose, Bld: 124 mg/dL — ABNORMAL HIGH (ref 70–99)
HCT: 42 % (ref 36.0–46.0)
Hemoglobin: 14.3 g/dL (ref 12.0–15.0)
Potassium: 3.8 mmol/L (ref 3.5–5.1)
Sodium: 139 mmol/L (ref 135–145)
TCO2: 29 mmol/L (ref 22–32)

## 2019-01-16 LAB — CBC
HCT: 41.8 % (ref 36.0–46.0)
Hemoglobin: 13.1 g/dL (ref 12.0–15.0)
MCH: 31.4 pg (ref 26.0–34.0)
MCHC: 31.3 g/dL (ref 30.0–36.0)
MCV: 100.2 fL — ABNORMAL HIGH (ref 80.0–100.0)
Platelets: 199 10*3/uL (ref 150–400)
RBC: 4.17 MIL/uL (ref 3.87–5.11)
RDW: 13.9 % (ref 11.5–15.5)
WBC: 7.5 10*3/uL (ref 4.0–10.5)
nRBC: 0 % (ref 0.0–0.2)

## 2019-01-16 SURGERY — CARDIOVERSION
Anesthesia: General

## 2019-01-16 MED ORDER — PROPOFOL 10 MG/ML IV BOLUS
INTRAVENOUS | Status: DC | PRN
  Administered 2019-01-16: 20 mg via INTRAVENOUS
  Administered 2019-01-16 (×2): 50 mg via INTRAVENOUS

## 2019-01-16 MED ORDER — SODIUM CHLORIDE 0.9 % IV SOLN
INTRAVENOUS | Status: AC | PRN
  Administered 2019-01-16: 500 mL via INTRAVENOUS

## 2019-01-16 MED ORDER — LIDOCAINE 2% (20 MG/ML) 5 ML SYRINGE
INTRAMUSCULAR | Status: DC | PRN
  Administered 2019-01-16: 40 mg via INTRAVENOUS
  Administered 2019-01-16: 60 mg via INTRAVENOUS

## 2019-01-16 MED ORDER — PROMETHAZINE HCL 25 MG/ML IJ SOLN: 6.2500 mg | INTRAMUSCULAR | Status: DC | PRN

## 2019-01-16 NOTE — Discharge Instructions (Signed)

## 2019-01-16 NOTE — Interval H&P Note (Signed)
History and Physical Interval Note:  01/16/2019 10:43 AM  Beverly Kim  has presented today for surgery, with the diagnosis of AFIB.  The various methods of treatment have been discussed with the patient and family. After consideration of risks, benefits and other options for treatment, the patient has consented to  Procedure(s): CARDIOVERSION (N/A) as a surgical intervention.  The patient's history has been reviewed, patient examined, no change in status, stable for surgery.  I have reviewed the patient's chart and labs.  Questions were answered to the patient's satisfaction.     Jenkins Rouge

## 2019-01-16 NOTE — CV Procedure (Signed)
[place]: Anesthesia: Propofol  DCC x 2 120 then 150 J converted from afib to NSR and AV pacing rate 68 St Jude device interrogated and functioning normally  Remote transmission was too slow and patient had to be put back to sleep After 1 st shock waiting for ST Jude Rep to show up for live interrogation of Device as we could not tell if first shock was successful  On Rx xarelto with no missed doses No immediate neurologic sequelae  Jenkins Rouge

## 2019-01-16 NOTE — Anesthesia Preprocedure Evaluation (Signed)
Anesthesia Evaluation  Patient identified by MRN, date of birth, ID band Patient awake    Reviewed: Allergy & Precautions, NPO status , Patient's Chart, lab work & pertinent test results, reviewed documented beta blocker date and time   History of Anesthesia Complications Negative for: history of anesthetic complications  Airway Mallampati: II  TM Distance: >3 FB Neck ROM: Full    Dental  (+) Partial Lower, Partial Upper   Pulmonary asthma ,    Pulmonary exam normal        Cardiovascular hypertension, Pt. on medications and Pt. on home beta blockers +CHF  Normal cardiovascular exam+ dysrhythmias (on Xarelto) Atrial Fibrillation   LBBB  TTE 2018: EF [id number], grade 1 diastolic dysfunction, mild MR, mild TR   Neuro/Psych negative neurological ROS  negative psych ROS   GI/Hepatic Neg liver ROS, hiatal hernia, GERD  Controlled,  Endo/Other  diabetesHypothyroidism   Renal/GU negative Renal ROS  negative genitourinary   Musculoskeletal  (+) Arthritis ,   Abdominal   Peds  Hematology negative hematology ROS (+)   Anesthesia Other Findings Day of surgery medications reviewed with patient.  Reproductive/Obstetrics negative OB ROS                             Anesthesia Physical Anesthesia Plan  ASA: III  Anesthesia Plan: General   Post-op Pain Management:    Induction: Intravenous  PONV Risk Score and Plan: Treatment may vary due to age or medical condition and Propofol infusion  Airway Management Planned: Mask  Additional Equipment: None  Intra-op Plan:   Post-operative Plan:   Informed Consent: I have reviewed the patients History and Physical, chart, labs and discussed the procedure including the risks, benefits and alternatives for the proposed anesthesia with the patient or authorized representative who has indicated his/her understanding and acceptance.       Plan  Discussed with: CRNA  Anesthesia Plan Comments:         Anesthesia Quick Evaluation

## 2019-01-16 NOTE — Anesthesia Procedure Notes (Signed)
Procedure Name: General with mask airway Date/Time: 01/16/2019 11:30 AM Performed by: Janene Harvey, CRNA Pre-anesthesia Checklist: Patient identified, Emergency Drugs available, Suction available, Patient being monitored and Timeout performed Patient Re-evaluated:Patient Re-evaluated prior to induction Oxygen Delivery Method: Ambu bag Preoxygenation: Pre-oxygenation with 100% oxygen Induction Type: IV induction Ventilation: Mask ventilation without difficulty

## 2019-01-16 NOTE — Anesthesia Postprocedure Evaluation (Signed)
Anesthesia Post Note  Patient: Beverly Kim  Procedure(s) Performed: CARDIOVERSION (N/A )     Patient location during evaluation: PACU Anesthesia Type: General Level of consciousness: awake and alert and oriented Pain management: pain level controlled Vital Signs Assessment: post-procedure vital signs reviewed and stable Respiratory status: spontaneous breathing, nonlabored ventilation and respiratory function stable Cardiovascular status: blood pressure returned to baseline Postop Assessment: no apparent nausea or vomiting Anesthetic complications: no    Last Vitals:  Vitals:   01/16/19 1036 01/16/19 1158  BP: (!) 156/71 (!) 116/47  Pulse:  65  Resp: 17 17  Temp: 36.5 C   SpO2: 99% 99%    Last Pain:  Vitals:   01/16/19 1158  TempSrc:   PainSc: 0-No pain                 Brennan Bailey

## 2019-01-16 NOTE — Transfer of Care (Signed)
Immediate Anesthesia Transfer of Care Note  Patient: Beverly Kim  Procedure(s) Performed: CARDIOVERSION (N/A )  Patient Location: Endoscopy Unit  Anesthesia Type:General  Level of Consciousness: awake, alert  and oriented  Airway & Oxygen Therapy: Patient Spontanous Breathing  Post-op Assessment: Report given to RN, Post -op Vital signs reviewed and stable and Patient moving all extremities X 4  Post vital signs: Reviewed and stable  Last Vitals:  Vitals Value Taken Time  BP    Temp    Pulse    Resp    SpO2      Last Pain:  Vitals:   01/16/19 1036  TempSrc: Temporal  PainSc: 0-No pain         Complications: No apparent anesthesia complications

## 2019-01-19 ENCOUNTER — Encounter (HOSPITAL_COMMUNITY): Payer: Self-pay | Admitting: Cardiovascular Disease

## 2019-01-23 ENCOUNTER — Telehealth: Payer: Self-pay | Admitting: Internal Medicine

## 2019-01-23 ENCOUNTER — Ambulatory Visit (HOSPITAL_COMMUNITY)
Admission: RE | Admit: 2019-01-23 | Discharge: 2019-01-23 | Disposition: A | Discharge status: Home / Self Care | Payer: PPO | Source: Ambulatory Visit | Attending: Physician Assistant | Admitting: Physician Assistant

## 2019-01-23 ENCOUNTER — Other Ambulatory Visit: Payer: Self-pay

## 2019-01-23 VITALS — BP 126/60 | HR 61 | Ht 63.0 in | Wt 174.6 lb

## 2019-01-23 DIAGNOSIS — Z79899 Other long term (current) drug therapy: Secondary | ICD-10-CM | POA: Insufficient documentation

## 2019-01-23 DIAGNOSIS — I110 Hypertensive heart disease with heart failure: Secondary | ICD-10-CM | POA: Diagnosis not present

## 2019-01-23 DIAGNOSIS — I480 Paroxysmal atrial fibrillation: Secondary | ICD-10-CM | POA: Insufficient documentation

## 2019-01-23 DIAGNOSIS — E785 Hyperlipidemia, unspecified: Secondary | ICD-10-CM | POA: Insufficient documentation

## 2019-01-23 DIAGNOSIS — I2510 Atherosclerotic heart disease of native coronary artery without angina pectoris: Secondary | ICD-10-CM | POA: Diagnosis not present

## 2019-01-23 DIAGNOSIS — I509 Heart failure, unspecified: Secondary | ICD-10-CM | POA: Insufficient documentation

## 2019-01-23 DIAGNOSIS — I4819 Other persistent atrial fibrillation: Secondary | ICD-10-CM | POA: Diagnosis not present

## 2019-01-23 DIAGNOSIS — D6869 Other thrombophilia: Secondary | ICD-10-CM | POA: Diagnosis not present

## 2019-01-23 DIAGNOSIS — E039 Hypothyroidism, unspecified: Secondary | ICD-10-CM | POA: Diagnosis not present

## 2019-01-23 DIAGNOSIS — I4891 Unspecified atrial fibrillation: Secondary | ICD-10-CM | POA: Insufficient documentation

## 2019-01-23 NOTE — Telephone Encounter (Signed)
Spoke with daughter Mickel Baas (dpr on file), wants to know if pt can be switched from Albuterol to Trelegy since Trelegy works well for her.  I advised that we have not seen pt since 2015, and would have to see her in clinic before we could make any changes to her med regimen.  Daughter requests that pt be seen by Dr. Vaughan Browner since that's who she sees.  I've scheduled pt for next available consult slot with Dr. Vaughan Browner to address these concerns.  Nothing further needed at this time- will close encounter.

## 2019-01-23 NOTE — Progress Notes (Signed)
Primary Care Physician: [name], MD Primary Cardiologist: Dr Percival Spanish Primary Electrophysiologist: Dr Rayann Heman Referring Physician: Dr Calla Kicks is a 85 y.o. female with a history of CHB, non obstructive CAD, HTN, HLD, hearing loss, hypothyroidism, and persistent atrial fibrillation who presents for follow up in the [facility].  The patient was initially diagnosed with atrial fibrillation remotely but AF burden on her device interrogation has previously always been <1%. However, she appears to be in persistent afib since 11/16/18 per device clinic. She does have symptoms of increased fatigue and intermittent palpitations. She was advised to stop ASA and start Eliquis for a CHADS2VASC score of 5. She denies alcohol use or significant snoring. There were no specific triggers that she could identify but she does note she has been under a lot of stress recently.  On follow up today, patient is s/p DCCV on 01/04/19. Patient reports that she feels minimally improved. She is in AV dual paced rhythm today. She does admit to some vague left sided abdominal discomfort since her procedure. This has improved. No diarrhea, nausea, or vomiting. No symptoms of dysuria or frequency. Patient has an appointment with her PCP.  Today, she denies symptoms of palpitations, chest pain, shortness of breath, orthopnea, PND, lower extremity edema, dizziness, presyncope, syncope, snoring, daytime somnolence, bleeding, or neurologic sequela. The patient is tolerating medications without difficulties and is otherwise without complaint today.   Atrial Fibrillation Risk Factors:  she does not have symptoms or diagnosis of sleep apnea. she does not have a history of alcohol use. The patient does have a history of early familial atrial fibrillation or other arrhythmias. Son has afib.  she has a BMI of Body mass index is 30.93 kg/m.Marland Kitchen Filed Weights   01/23/19 1133  Weight:  79.2 kg    Family History  Problem Relation Age of Onset  . Coronary artery disease Other        family hx of 1st degree relative <50  . Diabetes Other        family hx of  . Other Other        cardiovascular disorder family hx of  . Other Other        neurological disorder family hx of  . Other Other        respiratory disease family hx of  . Heart attack Daughter   . Heart Problems Other        all children  . Sudden death Son   . Heart disease Brother   . Heart disease Sister   . Colon cancer Neg Hx   . Colon polyps Neg Hx      Atrial Fibrillation Management history:  Previous antiarrhythmic drugs: none Previous cardioversions: 01/16/19 Previous ablations: none CHADS2VASC score: 5 Anticoagulation history: Eliquis, Xarelto    Past Medical History:  Diagnosis Date  . Atrial fibrillation ([place]) 08/16/2016   observed on PPM interrogation, asymptomatic, chad2vasc score is 4.  will consider anticoagulation  . CHF (congestive heart failure) ([place])   . Colon polyps   . Coronary artery disease   . Diverticulosis   . Dyspnea   . GERD (gastroesophageal reflux disease)   . Hearing loss of both ears   . Hepatic cyst   . Hiatal hernia   . Hyperlipidemia   . Hypertension   . Hypothyroidism   . Low back pain   . Mitral valve disorders(424.0)   . Other left bundle branch block   .  Palpitations   . URI (upper respiratory infection)    Past Surgical History:  Procedure Laterality Date  . CARDIAC CATHETERIZATION  04-18-01  . CARDIOVERSION N/A 01/16/2019   Procedure: CARDIOVERSION;  Surgeon: Josue Hector, MD;  Location: [facility] ENDOSCOPY;  Service: Cardiovascular;  Laterality: N/A;  . COLONOSCOPY    . HERNIA REPAIR    . PACEMAKER IMPLANT Left 05/15/2016   SJM Assirity MRI dual chamber PPM implanted by Dr Rayann Heman for CHB  . THYROIDECTOMY    . TONSILLECTOMY AND ADENOIDECTOMY    . TOTAL ABDOMINAL HYSTERECTOMY W/ BILATERAL SALPINGOOPHORECTOMY      Current Outpatient  Medications  Medication Sig Dispense Refill  . albuterol (PROVENTIL HFA;VENTOLIN HFA) 108 (90 Base) MCG/ACT inhaler Inhale 1-2 puffs into the lungs every 6 (six) hours as needed for wheezing or shortness of breath. 1 Inhaler 0  . BIOTIN PO Take 1 tablet by mouth daily.    . bisoprolol (ZEBETA) 5 MG tablet Take 1 tablet (5 mg total) by mouth daily. 90 tablet 1  . Calcium Carb-Cholecalciferol (CALCIUM/VITAMIN D PO) Take 1 tablet by mouth 2 (two) times daily.     . calcium carbonate (TUMS - DOSED IN MG ELEMENTAL CALCIUM) 500 MG chewable tablet Chew 1 tablet by mouth daily as needed for indigestion or heartburn.    . furosemide (LASIX) 40 MG tablet Take 1 tablet (40 mg total) by mouth daily. 90 tablet 3  . metolazone (ZAROXOLYN) 2.5 MG tablet TAKE ONE TABLET BY MOUTH THREE TIMES A WEEK (Patient taking differently: Take 2.5 mg by mouth 3 (three) times a week. ) 30 tablet 5  . Multiple Vitamins-Minerals (CENTRUM SILVER PO) Take 1 tablet by mouth daily.    . Rivaroxaban (XARELTO) 15 MG TABS tablet Take 1 tablet (15 mg total) by mouth daily with supper. 30 tablet 3  . SYNTHROID 88 MCG tablet TAKE 1 TABLET BY MOUTH ONCE DAILY BEFORE BREAKFAST (Patient taking differently: Take 88 mcg by mouth daily before breakfast. TAKE 1 TABLET BY MOUTH ONCE DAILY BEFORE BREAKFAST) 90 tablet 0  . vitamin C (ASCORBIC ACID) 500 MG tablet Take 500 mg by mouth daily.     No current facility-administered medications for this encounter.     Allergies  Allergen Reactions  . Adhesive [Tape] Itching, Dermatitis, Rash and Other (See Comments)    Blisters and "skin bubbles"  . Ace Inhibitors Cough  . Atorvastatin Other (See Comments)    REACTION: Reaction not known  . Clindamycin Other (See Comments)    Unknown  . Codeine Other (See Comments)    hallucinations   . Latex Other (See Comments)    blisters  . Shellfish Allergy Other (See Comments)    "gallbladder attack"  . Simvastatin Other (See Comments)    REACTION:  fatigue  . Penicillins Rash    Has patient had a PCN reaction causing immediate rash, facial/tongue/throat swelling, SOB or lightheadedness with hypotension: Unknown Has patient had a PCN reaction causing severe rash involving mucus membranes or skin necrosis: Unknown Has patient had a PCN reaction that required hospitalization: Unknown Has patient had a PCN reaction occurring within the last 10 years: No If all of the above answers are "NO", then may proceed with Cephalosporin use.     Social History   Socioeconomic History  . Marital status: Widowed    Spouse name: Not on file  . Number of children: 4  . Years of education: Not on file  . Highest education level: Not on file  Occupational History  . Occupation: retired  Scientific laboratory technician  . Financial resource strain: Not hard at all  . Food insecurity    Worry: Never true    Inability: Never true  . Transportation needs    Medical: No    Non-medical: No  Tobacco Use  . Smoking status: Never Smoker  . Smokeless tobacco: Never Used  Substance and Sexual Activity  . Alcohol use: No  . Drug use: No  . Sexual activity: Never  Lifestyle  . Physical activity    Days per week: 5 days    Minutes per session: 40 min  . Stress: Not at all  Relationships  . Social connections    Talks on phone: More than three times a week    Gets together: More than three times a week    Attends religious service: More than 4 times per year    Active member of club or organization: Yes    Attends meetings of clubs or organizations: More than 4 times per year    Relationship status: Widowed  . Intimate partner violence    Fear of current or ex partner: Not on file    Emotionally abused: Not on file    Physically abused: Not on file    Forced sexual activity: Not on file  Other Topics Concern  . Not on file  Social History Narrative  . Not on file     ROS- All systems are reviewed and negative except as per the HPI above.  Physical Exam:  Vitals:   01/23/19 1133  BP: 126/60  Pulse: 61  Weight: 79.2 kg  Height: 5\' 3"  (1.6 m)    GEN- The patient is well appearing obese elderly female, alert and oriented x 3 today.   HEENT-head normocephalic, atraumatic, sclera clear, conjunctiva pink, hearing intact, trachea midline. Lungs- Clear to ausculation bilaterally, normal work of breathing Heart- Regular rate and rhythm, no murmurs, rubs or gallops  GI- soft, NT, ND, + BS Extremities- no clubbing, cyanosis, or edema MS- no significant deformity or atrophy Skin- no rash or lesion Psych- euthymic mood, full affect Neuro- strength and sensation are intact   Wt Readings from Last 3 Encounters:  01/23/19 79.2 kg  01/16/19 75.5 kg  12/19/18 75.4 kg    EKG today demonstrates AV dual paced rhythm HR 61, QRS 142, QTc 491  Echo 06/28/18 demonstrated  - Left ventricle: The cavity size was normal. There was moderate   focal basal hypertrophy of the septum. Systolic function was   normal. The estimated ejection fraction was in the range of 60%   to 65%. Wall motion was normal; there were no regional wall   motion abnormalities. Doppler parameters are consistent with   abnormal left ventricular relaxation (grade 1 diastolic   dysfunction). - Aortic valve: Trileaflet; mildly thickened, mildly calcified   leaflets. There was trivial regurgitation. - Mitral valve: There was mild regurgitation. - Left atrium: Volume/bsa, S: 32.7 ml/m^2. - Tricuspid valve: There was mild regurgitation.  Impressions:  - EF is improved when compared to prior study.  Epic records are reviewed at length today  Assessment and Plan:  1. Persistent atrial fibrillation S/p DCCV on 01/16/19. Patient appears to be maintaining SR. She would like to avoid new medications given her propensity for side effects. Continue Xarelto 15 mg daily (CrCl <22mLmin).   This patients CHA2DS2-VASc Score and unadjusted Ischemic Stroke Rate (% per year) is equal to  7.2 % stroke rate/year from a score  of 5  Above score calculated as 1 point each if present [CHF, HTN, DM, Vascular=MI/PAD/Aortic Plaque, Age if 65-74, or Female] Above score calculated as 2 points each if present [Age > 75, or Stroke/TIA/TE]   2. CHB S/p PPM, followed by Dr Rayann Heman and the device clinic.  3. HTN Stable, no changes today.  4. Chronic diastolic dysfunction Patient's weight is up today although she admits she hasn't taken her Lasix. Daughter also admits some dietary indiscretions. Encouraged low sodium diet.  5. CAD No anginal symptoms. Followed by Dr Percival Spanish.   Follow up in the AF clinic in 4 months.   [facility] [address] [place], [place] [address] [phone] 01/23/2019 12:20 PM

## 2019-02-05 ENCOUNTER — Ambulatory Visit: Payer: PPO | Admitting: Pulmonary Disease

## 2019-02-05 ENCOUNTER — Encounter: Payer: Self-pay | Admitting: Pulmonary Disease

## 2019-02-05 ENCOUNTER — Ambulatory Visit (INDEPENDENT_AMBULATORY_CARE_PROVIDER_SITE_OTHER): Payer: PPO

## 2019-02-05 ENCOUNTER — Other Ambulatory Visit: Payer: Self-pay

## 2019-02-05 VITALS — BP 124/76 | HR 72 | Temp 97.5°F | Ht 62.5 in | Wt 169.2 lb

## 2019-02-05 DIAGNOSIS — I509 Heart failure, unspecified: Secondary | ICD-10-CM | POA: Diagnosis not present

## 2019-02-05 DIAGNOSIS — R0609 Other forms of dyspnea: Secondary | ICD-10-CM

## 2019-02-05 DIAGNOSIS — R0600 Dyspnea, unspecified: Secondary | ICD-10-CM

## 2019-02-05 MED ORDER — TRELEGY ELLIPTA 100-62.5-25 MCG/INH IN AEPB: 1.0000 | INHALATION_SPRAY | Freq: Every day | RESPIRATORY_TRACT | 0 refills | Status: DC

## 2019-02-05 MED ORDER — TRELEGY ELLIPTA 100-62.5-25 MCG/INH IN AEPB: 1.0000 | INHALATION_SPRAY | Freq: Every day | RESPIRATORY_TRACT | 3 refills | Status: DC

## 2019-02-05 NOTE — Progress Notes (Signed)
Beverly Kim    JB:[id number]    October 12, 1924  Primary Care Physician:Burns, Claudina Lick, MD  Referring Physician: [name], MD [place],  [place] [address]  Chief complaint: Consult for dyspnea  HPI: 85 year old with history of GERD, allergies, atrial fibrillation, Neri artery disease, diabetes Complains of dyspnea for the past few years.  No cough, sputum production, fevers, chills She has significant history of GERD and has been told in the past that she has hiatal hernia although is not present on imaging Also has seasonal allergies, allergic rhinitis with postnasal drip  History is significant for non obstructive coronary artery disease, atrial fibrillation.  She follows with Dr. Percival Spanish, underwent synchronized cardioversion on 01/16/2019.  Pets: Has 2 dogs.  No cats, birds, farm animal Occupation: Retired Scientist, clinical (histocompatibility and immunogenetics) Exposures: No mold, hot tub, Customer service manager.  No down or feather exposure Smoking history: Never smoker Travel history: No significant travel history Relevant family history: Daughter has COPD.  She is a smoker.  No other family history of lung disease  Outpatient Encounter Medications as of 02/05/2019  Medication Sig  . albuterol (PROVENTIL HFA;VENTOLIN HFA) 108 (90 Base) MCG/ACT inhaler Inhale 1-2 puffs into the lungs every 6 (six) hours as needed for wheezing or shortness of breath.  Marland Kitchen BIOTIN PO Take 1 tablet by mouth daily.  . bisoprolol (ZEBETA) 5 MG tablet Take 1 tablet (5 mg total) by mouth daily.  . Calcium Carb-Cholecalciferol (CALCIUM/VITAMIN D PO) Take 1 tablet by mouth 2 (two) times daily.   . calcium carbonate (TUMS - DOSED IN MG ELEMENTAL CALCIUM) 500 MG chewable tablet Chew 1 tablet by mouth daily as needed for indigestion or heartburn.  . furosemide (LASIX) 40 MG tablet Take 1 tablet (40 mg total) by mouth daily.  . metolazone (ZAROXOLYN) 2.5 MG tablet TAKE ONE TABLET BY MOUTH THREE TIMES A WEEK (Patient taking differently: Take 2.5 mg by  mouth 3 (three) times a week. )  . Multiple Vitamins-Minerals (CENTRUM SILVER PO) Take 1 tablet by mouth daily.  . Rivaroxaban (XARELTO) 15 MG TABS tablet Take 1 tablet (15 mg total) by mouth daily with supper.  Marland Kitchen SYNTHROID 88 MCG tablet TAKE 1 TABLET BY MOUTH ONCE DAILY BEFORE BREAKFAST (Patient taking differently: Take 88 mcg by mouth daily before breakfast. TAKE 1 TABLET BY MOUTH ONCE DAILY BEFORE BREAKFAST)  . vitamin C (ASCORBIC ACID) 500 MG tablet Take 500 mg by mouth daily.   No facility-administered encounter medications on file as of 02/05/2019.     Allergies as of 02/05/2019 - Review Complete 02/05/2019  Allergen Reaction Noted  . Adhesive [tape] Itching, Dermatitis, Rash, and Other (See Comments)   . Ace inhibitors Cough 05/27/2016  . Atorvastatin Other (See Comments)   . Clindamycin Other (See Comments) 07/06/2009  . Codeine Other (See Comments) 12/02/2006  . Latex Other (See Comments) 08/12/2007  . Shellfish allergy Other (See Comments) 07/25/2017  . Simvastatin Other (See Comments) 07/06/2009  . Penicillins Rash 12/02/2006    Past Medical History:  Diagnosis Date  . Atrial fibrillation ([place]) 08/16/2016   observed on PPM interrogation, asymptomatic, chad2vasc score is 4.  will consider anticoagulation  . CHF (congestive heart failure) ([place])   . Colon polyps   . Coronary artery disease   . Diverticulosis   . Dyspnea   . GERD (gastroesophageal reflux disease)   . Hearing loss of both ears   . Hepatic cyst   . Hiatal hernia   .  Hyperlipidemia   . Hypertension   . Hypothyroidism   . Low back pain   . Mitral valve disorders(424.0)   . Other left bundle branch block   . Palpitations   . URI (upper respiratory infection)     Past Surgical History:  Procedure Laterality Date  . CARDIAC CATHETERIZATION  04-18-01  . CARDIOVERSION N/A 01/16/2019   Procedure: CARDIOVERSION;  Surgeon: Josue Hector, MD;  Location: [facility] ENDOSCOPY;  Service: Cardiovascular;  Laterality:  N/A;  . COLONOSCOPY    . HERNIA REPAIR    . PACEMAKER IMPLANT Left 05/15/2016   SJM Assirity MRI dual chamber PPM implanted by Dr Rayann Heman for CHB  . THYROIDECTOMY    . TONSILLECTOMY AND ADENOIDECTOMY    . TOTAL ABDOMINAL HYSTERECTOMY W/ BILATERAL SALPINGOOPHORECTOMY      Family History  Problem Relation Age of Onset  . Coronary artery disease Other        family hx of 1st degree relative <50  . Diabetes Other        family hx of  . Other Other        cardiovascular disorder family hx of  . Other Other        neurological disorder family hx of  . Other Other        respiratory disease family hx of  . Heart attack Daughter   . Heart Problems Other        all children  . Sudden death Son   . Heart disease Brother   . Heart disease Sister   . Colon cancer Neg Hx   . Colon polyps Neg Hx     Social History   Socioeconomic History  . Marital status: Widowed    Spouse name: Not on file  . Number of children: 4  . Years of education: Not on file  . Highest education level: Not on file  Occupational History  . Occupation: retired  Scientific laboratory technician  . Financial resource strain: Not hard at all  . Food insecurity    Worry: Never true    Inability: Never true  . Transportation needs    Medical: No    Non-medical: No  Tobacco Use  . Smoking status: Never Smoker  . Smokeless tobacco: Never Used  Substance and Sexual Activity  . Alcohol use: No  . Drug use: No  . Sexual activity: Never  Lifestyle  . Physical activity    Days per week: 5 days    Minutes per session: 40 min  . Stress: Not at all  Relationships  . Social connections    Talks on phone: More than three times a week    Gets together: More than three times a week    Attends religious service: More than 4 times per year    Active member of club or organization: Yes    Attends meetings of clubs or organizations: More than 4 times per year    Relationship status: Widowed  . Intimate partner violence    Fear of  current or ex partner: Not on file    Emotionally abused: Not on file    Physically abused: Not on file    Forced sexual activity: Not on file  Other Topics Concern  . Not on file  Social History Narrative  . Not on file    Review of systems: Review of Systems  Constitutional: Negative for fever and chills.  HENT: Negative.   Eyes: Negative for blurred vision.  Respiratory: as per  HPI  Cardiovascular: Negative for chest pain and palpitations.  Gastrointestinal: Negative for vomiting, diarrhea, blood per rectum. Genitourinary: Negative for dysuria, urgency, frequency and hematuria.  Musculoskeletal: Negative for myalgias, back pain and joint pain.  Skin: Negative for itching and rash.  Neurological: Negative for dizziness, tremors, focal weakness, seizures and loss of consciousness.  Endo/Heme/Allergies: Negative for environmental allergies.  Psychiatric/Behavioral: Negative for depression, suicidal ideas and hallucinations.  All other systems reviewed and are negative.  Physical Exam: Blood pressure 124/76, pulse 72, temperature (!) 97.5 F (36.4 C), temperature source Temporal, height 5' 2.5" (1.588 m), weight 169 lb 3.2 oz (76.7 kg), SpO2 96 %. Gen:      No acute distress HEENT:  EOMI, sclera anicteric Neck:     No masses; no thyromegaly Lungs:    Clear to auscultation bilaterally; normal respiratory effort CV:         Regular rate and rhythm; no murmurs Abd:      + bowel sounds; soft, non-tender; no palpable masses, no distension Ext:    No edema; adequate peripheral perfusion Skin:      Warm and dry; no rash Neuro: alert and oriented x 3 Psych: normal mood and affect  Data Reviewed: Imaging: Chest x-ray 03/05/2017-chronic granuloma, no active cardiopulmonary disease.  I have reviewed the images personally  PFTs: 01/12/2013-FVC 2.47 [114%], FEV1 1.98 [126%], F/F 80, TLC 4.08 [84%], DLCO 15.30 [68%] Normal spirometry and lung volumes.  Mild reduction in diffusion  capacity.  10/27/2013 FVC 1.6 [69%], FEV1 1.0 [64%], F/F 67 Moderate obstruction, restriction possible.  Labs: CBC 08/06/2018- WBC 8.5, eos 2.9%, absolute eosinophil count 247  Assessment:  Evaluation for dyspnea Unclear etiology for dyspnea.  Could be related to her coronary artery disease, atrial fibrillation We will evaluate with chest x-ray, PFTs Check CBC differential, IgE as she may have reactive airway disease, asthma given history of allergies and significant GERD.  Low suspicion for COPD as she is a non-smoker  Trial trelegy inhaler   Plan/Recommendations: - Chest x-ray, PFTs - CBC, IgE - Trelegy inhaler  Marshell Garfinkel MD [facility] 02/05/2019, 4:20 PM  CC: [name], MD

## 2019-02-05 NOTE — Progress Notes (Signed)
Subjective:    Patient ID: Beverly Kim, female    DOB: [birth year], 85 y.o.   MRN: [id number]  HPI The patient is here for follow up. She is here with her daughter.   She is not exercising regularly.     CAD, HFpEF, AFib Hypertension: She is taking her medication daily. She is compliant with a low sodium diet.  She denies chest pain, palpitations, edema and regular headaches.     Hyperlipidemia: She has not tolerated statins. She is compliant with a low fat/cholesterol diet. She denies myalgias.   Hypothyroidism:  She is taking her medication daily.  She denies any recent changes in energy or weight that are unexplained.   Diabetes: She is taking her medication daily as prescribed. She is compliant with a diabetic diet.     GERD:  She is taking her medication daily as prescribed.  She denies any GERD symptoms and feels her GERD is well controlled.   She has a lot of joint pain.     Medications and allergies reviewed with patient and updated if appropriate.  Patient Active Problem List   Diagnosis Date Noted  . Persistent atrial fibrillation ([place]) 01/23/2019  . Acquired thrombophilia ([place]) 01/23/2019  . Fatigue 11/25/2018  . Poor balance 10/22/2018  . Calculus of gallbladder without cholecystitis without obstruction 07/26/2017  . Pyelonephritis 07/25/2017  . Carotid disease, bilateral ([place]) 04/08/2017  . Leg pain 04/08/2017  . Non-rheumatic mitral regurgitation 07/12/2016  . Complete heart block ([place]) 05/14/2016  . Diabetes mellitus without complication ([place]) [id number]  . Degenerative disc disease, cervical 08/23/2015  . Degenerative disc disease, lumbar 08/23/2015  . Left rotator cuff tear arthropathy 07/12/2015  . Left shoulder pain 04/06/2015  . Frozen shoulder 01/03/2015  . Cough variant asthma 12/08/2013  . Upper airway cough syndrome 10/27/2013  . Dyspnea 02/03/2013  . GERD 07/06/2009  . HIATAL HERNIA 07/06/2009  . DIVERTICULOSIS, COLON 07/06/2009  .  HEPATIC CYST 07/06/2009  . COLONIC POLYPS, HX OF 07/06/2009  . MIXED HEARING LOSS BILATERAL 12/13/2008  . Essential hypertension 06/19/2008  . MITRAL VALVE PROLAPSE 06/19/2008  . LBBB (left bundle branch block) 06/19/2008  . Hyperlipidemia 08/12/2007  . Hypothyroidism 12/02/2006  . CAD (coronary artery disease) 12/02/2006  . Acute on chronic diastolic heart failure ([place]) 12/02/2006    Current Outpatient Medications on File Prior to Visit  Medication Sig Dispense Refill  . albuterol (PROVENTIL HFA;VENTOLIN HFA) 108 (90 Base) MCG/ACT inhaler Inhale 1-2 puffs into the lungs every 6 (six) hours as needed for wheezing or shortness of breath. 1 Inhaler 0  . BIOTIN PO Take 1 tablet by mouth daily.    . bisoprolol (ZEBETA) 5 MG tablet Take 1 tablet (5 mg total) by mouth daily. 90 tablet 1  . Calcium Carb-Cholecalciferol (CALCIUM/VITAMIN D PO) Take 1 tablet by mouth 2 (two) times daily.     . calcium carbonate (TUMS - DOSED IN MG ELEMENTAL CALCIUM) 500 MG chewable tablet Chew 1 tablet by mouth daily as needed for indigestion or heartburn.    . Fluticasone-Umeclidin-Vilant (TRELEGY ELLIPTA) 100-62.5-25 MCG/INH AEPB Inhale 1 puff into the lungs daily. 14 each 0  . Fluticasone-Umeclidin-Vilant (TRELEGY ELLIPTA) 100-62.5-25 MCG/INH AEPB Inhale 1 puff into the lungs daily. 60 each 3  . metolazone (ZAROXOLYN) 2.5 MG tablet TAKE ONE TABLET BY MOUTH THREE TIMES A WEEK (Patient taking differently: Take 2.5 mg by mouth 3 (three) times a week. ) 30 tablet 5  . Multiple Vitamins-Minerals (CENTRUM SILVER PO) Take  1 tablet by mouth daily.    . Rivaroxaban (XARELTO) 15 MG TABS tablet Take 1 tablet (15 mg total) by mouth daily with supper. 30 tablet 3  . SYNTHROID 88 MCG tablet TAKE 1 TABLET BY MOUTH ONCE DAILY BEFORE BREAKFAST (Patient taking differently: Take 88 mcg by mouth daily before breakfast. TAKE 1 TABLET BY MOUTH ONCE DAILY BEFORE BREAKFAST) 90 tablet 0  . vitamin C (ASCORBIC ACID) 500 MG tablet Take 500  mg by mouth daily.     No current facility-administered medications on file prior to visit.     Past Medical History:  Diagnosis Date  . Atrial fibrillation ([place]) 08/16/2016   observed on PPM interrogation, asymptomatic, chad2vasc score is 4.  will consider anticoagulation  . CHF (congestive heart failure) ([place])   . Colon polyps   . Coronary artery disease   . Diverticulosis   . Dyspnea   . GERD (gastroesophageal reflux disease)   . Hearing loss of both ears   . Hepatic cyst   . Hiatal hernia   . Hyperlipidemia   . Hypertension   . Hypothyroidism   . Low back pain   . Mitral valve disorders(424.0)   . Other left bundle branch block   . Palpitations   . URI (upper respiratory infection)     Past Surgical History:  Procedure Laterality Date  . CARDIAC CATHETERIZATION  04-18-01  . CARDIOVERSION N/A 01/16/2019   Procedure: CARDIOVERSION;  Surgeon: Josue Hector, MD;  Location: [facility] ENDOSCOPY;  Service: Cardiovascular;  Laterality: N/A;  . COLONOSCOPY    . HERNIA REPAIR    . PACEMAKER IMPLANT Left 05/15/2016   SJM Assirity MRI dual chamber PPM implanted by Dr Rayann Heman for CHB  . THYROIDECTOMY    . TONSILLECTOMY AND ADENOIDECTOMY    . TOTAL ABDOMINAL HYSTERECTOMY W/ BILATERAL SALPINGOOPHORECTOMY      Social History   Socioeconomic History  . Marital status: Widowed    Spouse name: Not on file  . Number of children: 4  . Years of education: Not on file  . Highest education level: Not on file  Occupational History  . Occupation: retired  Scientific laboratory technician  . Financial resource strain: Not hard at all  . Food insecurity    Worry: Never true    Inability: Never true  . Transportation needs    Medical: No    Non-medical: No  Tobacco Use  . Smoking status: Never Smoker  . Smokeless tobacco: Never Used  Substance and Sexual Activity  . Alcohol use: No  . Drug use: No  . Sexual activity: Never  Lifestyle  . Physical activity    Days per week: 5 days    Minutes per  session: 40 min  . Stress: Not at all  Relationships  . Social connections    Talks on phone: More than three times a week    Gets together: More than three times a week    Attends religious service: More than 4 times per year    Active member of club or organization: Yes    Attends meetings of clubs or organizations: More than 4 times per year    Relationship status: Widowed  Other Topics Concern  . Not on file  Social History Narrative  . Not on file    Family History  Problem Relation Age of Onset  . Coronary artery disease Other        family hx of 1st degree relative <50  . Diabetes Other  family hx of  . Other Other        cardiovascular disorder family hx of  . Other Other        neurological disorder family hx of  . Other Other        respiratory disease family hx of  . Heart attack Daughter   . Heart Problems Other        all children  . Sudden death Son   . Heart disease Brother   . Heart disease Sister   . Colon cancer Neg Hx   . Colon polyps Neg Hx     Review of Systems  Constitutional: Negative for fever.  Respiratory: Positive for shortness of breath. Negative for cough and wheezing.   Cardiovascular: Negative for chest pain, palpitations and leg swelling.  Musculoskeletal: Positive for arthralgias and back pain.  Neurological: Positive for dizziness and headaches.       Objective:   Vitals:   02/06/19 1348  BP: 140/60  Pulse: 68  Temp: 97.7 F (36.5 C)  SpO2: 95%   BP Readings from Last 3 Encounters:  02/06/19 140/60  02/05/19 124/76  01/23/19 126/60   Wt Readings from Last 3 Encounters:  02/06/19 166 lb (75.3 kg)  02/05/19 169 lb 3.2 oz (76.7 kg)  01/23/19 174 lb 9.6 oz (79.2 kg)   Body mass index is 29.88 kg/m.   Physical Exam    Constitutional: Appears well-developed and well-nourished. No distress.  HENT:  Head: Normocephalic and atraumatic.  Neck: Neck supple. No tracheal deviation present. No thyromegaly present.   No cervical lymphadenopathy Cardiovascular: Normal rate, regular rhythm and normal heart sounds.   No murmur heard. No carotid bruit .  No edema Pulmonary/Chest: Effort normal and breath sounds normal. No respiratory distress. No has no wheezes. No rales.  Skin: Skin is warm and dry. Not diaphoretic.  Psychiatric: Normal mood and affect. Behavior is normal.      Assessment & Plan:    See Problem List for Assessment and Plan of chronic medical problems.

## 2019-02-05 NOTE — Patient Instructions (Signed)
We will get a chest x-ray Check CBC differential, IgE Start Trelegy inhaler Schedule pulmonary function test Follow-up in 1 to 2 months.

## 2019-02-06 ENCOUNTER — Ambulatory Visit (INDEPENDENT_AMBULATORY_CARE_PROVIDER_SITE_OTHER): Payer: PPO | Admitting: Internal Medicine

## 2019-02-06 ENCOUNTER — Other Ambulatory Visit (INDEPENDENT_AMBULATORY_CARE_PROVIDER_SITE_OTHER): Payer: PPO

## 2019-02-06 ENCOUNTER — Other Ambulatory Visit: Payer: Self-pay

## 2019-02-06 ENCOUNTER — Encounter: Payer: Self-pay | Admitting: Internal Medicine

## 2019-02-06 VITALS — BP 140/60 | HR 68 | Temp 97.7°F | Ht 62.5 in | Wt 166.0 lb

## 2019-02-06 DIAGNOSIS — E038 Other specified hypothyroidism: Secondary | ICD-10-CM

## 2019-02-06 DIAGNOSIS — I10 Essential (primary) hypertension: Secondary | ICD-10-CM | POA: Diagnosis not present

## 2019-02-06 DIAGNOSIS — E119 Type 2 diabetes mellitus without complications: Secondary | ICD-10-CM

## 2019-02-06 DIAGNOSIS — E782 Mixed hyperlipidemia: Secondary | ICD-10-CM

## 2019-02-06 LAB — CBC WITH DIFFERENTIAL/PLATELET
Basophils Absolute: 0.1 10*3/uL (ref 0.0–0.1)
Basophils Relative: 1 % (ref 0.0–3.0)
Eosinophils Absolute: 0.2 10*3/uL (ref 0.0–0.7)
Eosinophils Relative: 2.3 % (ref 0.0–5.0)
HCT: 44.3 % (ref 36.0–46.0)
Hemoglobin: 15 g/dL (ref 12.0–15.0)
Lymphocytes Relative: 30.3 % (ref 12.0–46.0)
Lymphs Abs: 3 10*3/uL (ref 0.7–4.0)
MCHC: 33.8 g/dL (ref 30.0–36.0)
MCV: 93.8 fl (ref 78.0–100.0)
Monocytes Absolute: 0.8 10*3/uL (ref 0.1–1.0)
Monocytes Relative: 7.9 % (ref 3.0–12.0)
Neutro Abs: 5.9 10*3/uL (ref 1.4–7.7)
Neutrophils Relative %: 58.5 % (ref 43.0–77.0)
Platelets: 251 10*3/uL (ref 150.0–400.0)
RBC: 4.72 Mil/uL (ref 3.87–5.11)
RDW: 14.2 % (ref 11.5–15.5)
WBC: 10.1 10*3/uL (ref 4.0–10.5)

## 2019-02-06 LAB — COMPREHENSIVE METABOLIC PANEL
ALT: 20 U/L (ref 0–35)
AST: 23 U/L (ref 0–37)
Albumin: 4.4 g/dL (ref 3.5–5.2)
Alkaline Phosphatase: 79 U/L (ref 39–117)
BUN: 37 mg/dL — ABNORMAL HIGH (ref 6–23)
CO2: 23 mEq/L (ref 19–32)
Calcium: 10.1 mg/dL (ref 8.4–10.5)
Chloride: 101 mEq/L (ref 96–112)
Creatinine, Ser: 1.14 mg/dL (ref 0.40–1.20)
GFR: 44.54 mL/min — ABNORMAL LOW (ref >60.00)
Glucose, Bld: 119 mg/dL — ABNORMAL HIGH (ref 70–99)
Potassium: 4 mEq/L (ref 3.5–5.1)
Sodium: 138 mEq/L (ref 135–145)
Total Bilirubin: 0.8 mg/dL (ref 0.2–1.2)
Total Protein: 7.5 g/dL (ref 6.0–8.3)

## 2019-02-06 LAB — IGE: IgE (Immunoglobulin E), Serum: 384 kU/L — ABNORMAL HIGH (ref <=114)

## 2019-02-06 LAB — TSH: TSH: 5.03 u[IU]/mL — ABNORMAL HIGH (ref 0.35–4.50)

## 2019-02-06 LAB — HEMOGLOBIN A1C: Hgb A1c MFr Bld: 6.5 % (ref 4.6–6.5)

## 2019-02-06 MED ORDER — FUROSEMIDE 40 MG PO TABS: 40.0000 mg | ORAL_TABLET | Freq: Every day | ORAL | 3 refills | Status: DC

## 2019-02-06 NOTE — Patient Instructions (Addendum)
Consider taking Pepcid daily for your heartburn/reflux.  If you have pain take tylenol three times a day not as needed.  She can take up to 3000 mg a day.    Try biofreeze or arthritis medication.  Check with cardiology about CBD products.    Tests ordered today. Your results will be released to [place] (or called to you) after review.  If any changes need to be made, you will be notified at that same time.   Medications reviewed and updated.  Changes include :   none    Please followup in 6 months

## 2019-02-07 NOTE — Assessment & Plan Note (Signed)
Clinically euthyroid Check tsh  Titrate med dose if needed  

## 2019-02-07 NOTE — Assessment & Plan Note (Signed)
BP well controlled Current regimen effective and well tolerated Continue current medications at current doses cmp  

## 2019-02-07 NOTE — Assessment & Plan Note (Signed)
Not currently on a statin

## 2019-02-07 NOTE — Assessment & Plan Note (Signed)
Diet controlled  Check a1c

## 2019-02-11 LAB — HM DIABETES EYE EXAM

## 2019-02-12 ENCOUNTER — Ambulatory Visit (INDEPENDENT_AMBULATORY_CARE_PROVIDER_SITE_OTHER): Payer: PPO | Admitting: *Deleted

## 2019-02-12 DIAGNOSIS — I442 Atrioventricular block, complete: Secondary | ICD-10-CM | POA: Diagnosis not present

## 2019-02-16 LAB — CUP PACEART REMOTE DEVICE CHECK
Battery Remaining Longevity: 109 mo
Battery Remaining Percentage: 95.5 %
Battery Voltage: 2.99 V
Brady Statistic AP VP Percent: 61 %
Brady Statistic AP VS Percent: 1 %
Brady Statistic AS VP Percent: 37 %
Brady Statistic AS VS Percent: 1 %
Brady Statistic RA Percent Paced: 59 %
Brady Statistic RV Percent Paced: 98 %
Date Time Interrogation Session: 20201213231616
Implantable Lead Implant Date: 20180313
Implantable Lead Implant Date: 20180313
Implantable Lead Location: 753859
Implantable Lead Location: 753860
Implantable Pulse Generator Implant Date: 20180313
Lead Channel Impedance Value: 430 Ohm
Lead Channel Impedance Value: 460 Ohm
Lead Channel Pacing Threshold Amplitude: 0.875 V
Lead Channel Pacing Threshold Amplitude: 1.25 V
Lead Channel Pacing Threshold Pulse Width: 0.5 ms
Lead Channel Pacing Threshold Pulse Width: 0.5 ms
Lead Channel Sensing Intrinsic Amplitude: 12 mV
Lead Channel Sensing Intrinsic Amplitude: 3.3 mV
Lead Channel Setting Pacing Amplitude: 1.125
Lead Channel Setting Pacing Amplitude: 2.5 V
Lead Channel Setting Pacing Pulse Width: 0.5 ms
Lead Channel Setting Sensing Sensitivity: 4 mV
Pulse Gen Model: 2272
Pulse Gen Serial Number: 8005625

## 2019-02-24 ENCOUNTER — Encounter: Payer: Self-pay | Admitting: Internal Medicine

## 2019-02-24 NOTE — Progress Notes (Signed)
Outside notes received. Information abstracted. Notes sent to scan.  

## 2019-04-24 ENCOUNTER — Other Ambulatory Visit (HOSPITAL_COMMUNITY): Payer: Self-pay | Admitting: Physician Assistant

## 2019-05-03 ENCOUNTER — Other Ambulatory Visit: Payer: Self-pay

## 2019-05-03 ENCOUNTER — Ambulatory Visit: Payer: PPO | Attending: Internal Medicine

## 2019-05-03 DIAGNOSIS — Z23 Encounter for immunization: Secondary | ICD-10-CM | POA: Insufficient documentation

## 2019-05-03 NOTE — Progress Notes (Signed)
   Covid-19 Vaccination Clinic  Name:  Beverly Kim    MRN: [id number] DOB: [birth year]  05/03/2019  [name] was observed post Covid-19 immunization for 30 minutes based on pre-vaccination screening without incidence. She was provided with Vaccine Information Sheet and instruction to access the V-Safe system.   [name] was instructed to call 911 with any severe reactions post vaccine: Marland Kitchen Difficulty breathing  . Swelling of your face and throat  . A fast heartbeat  . A bad rash all over your body  . Dizziness and weakness    Immunizations Administered    Name Date Dose VIS Date Route   Pfizer COVID-19 Vaccine 05/03/2019  2:41 PM 0.3 mL 02/13/2019 Intramuscular   Manufacturer: [place]   Lot: [id number]   [place]: [id number]

## 2019-05-15 ENCOUNTER — Other Ambulatory Visit: Payer: Self-pay | Admitting: Internal Medicine

## 2019-05-22 ENCOUNTER — Ambulatory Visit (INDEPENDENT_AMBULATORY_CARE_PROVIDER_SITE_OTHER): Payer: PPO | Admitting: *Deleted

## 2019-05-22 DIAGNOSIS — I442 Atrioventricular block, complete: Secondary | ICD-10-CM

## 2019-05-22 LAB — CUP PACEART REMOTE DEVICE CHECK
Battery Remaining Longevity: 106 mo
Battery Remaining Percentage: 95.5 %
Battery Voltage: 2.99 V
Brady Statistic AP VP Percent: 55 %
Brady Statistic AP VS Percent: 1 %
Brady Statistic AS VP Percent: 42 %
Brady Statistic AS VS Percent: 1 %
Brady Statistic RA Percent Paced: 46 %
Brady Statistic RV Percent Paced: 97 %
Date Time Interrogation Session: 20210319010201
Implantable Lead Implant Date: 20180313
Implantable Lead Implant Date: 20180313
Implantable Lead Location: 753859
Implantable Lead Location: 753860
Implantable Pulse Generator Implant Date: 20180313
Lead Channel Impedance Value: 360 Ohm
Lead Channel Impedance Value: 430 Ohm
Lead Channel Pacing Threshold Amplitude: 0.75 V
Lead Channel Pacing Threshold Amplitude: 1.25 V
Lead Channel Pacing Threshold Pulse Width: 0.5 ms
Lead Channel Pacing Threshold Pulse Width: 0.5 ms
Lead Channel Sensing Intrinsic Amplitude: 12 mV
Lead Channel Sensing Intrinsic Amplitude: 2.6 mV
Lead Channel Setting Pacing Amplitude: 1 V
Lead Channel Setting Pacing Amplitude: 2.5 V
Lead Channel Setting Pacing Pulse Width: 0.5 ms
Lead Channel Setting Sensing Sensitivity: 4 mV
Pulse Gen Model: 2272
Pulse Gen Serial Number: 8005625

## 2019-05-22 NOTE — Progress Notes (Signed)
PPM Remote  

## 2019-05-28 ENCOUNTER — Other Ambulatory Visit: Payer: Self-pay | Admitting: Internal Medicine

## 2019-05-29 ENCOUNTER — Ambulatory Visit (HOSPITAL_COMMUNITY): Payer: PPO | Admitting: Physician Assistant

## 2019-06-03 ENCOUNTER — Ambulatory Visit: Payer: PPO | Attending: Internal Medicine

## 2019-06-03 DIAGNOSIS — Z23 Encounter for immunization: Secondary | ICD-10-CM

## 2019-06-03 NOTE — Progress Notes (Signed)
   Covid-19 Vaccination Clinic  Name:  Beverly Kim    MRN: [id number] DOB: 1924/07/26  06/03/2019  [name] was observed post Covid-19 immunization for 15 minutes without incident. She was provided with Vaccine Information Sheet and instruction to access the V-Safe system.   [name] was instructed to call 911 with any severe reactions post vaccine: Marland Kitchen Difficulty breathing  . Swelling of face and throat  . A fast heartbeat  . A bad rash all over body  . Dizziness and weakness   Immunizations Administered    Name Date Dose VIS Date Route   Pfizer COVID-19 Vaccine 06/03/2019  2:22 PM 0.3 mL 02/13/2019 Intramuscular   Manufacturer: [place]   Lot: [id number]   [place]: [id number]

## 2019-06-12 ENCOUNTER — Other Ambulatory Visit: Payer: Self-pay

## 2019-06-12 ENCOUNTER — Ambulatory Visit (HOSPITAL_COMMUNITY)
Admission: RE | Admit: 2019-06-12 | Discharge: 2019-06-12 | Disposition: A | Discharge status: Home / Self Care | Payer: PPO | Source: Ambulatory Visit | Attending: Physician Assistant | Admitting: Physician Assistant

## 2019-06-12 ENCOUNTER — Encounter (HOSPITAL_COMMUNITY): Payer: Self-pay | Admitting: Physician Assistant

## 2019-06-12 VITALS — BP 138/72 | HR 70 | Ht 62.5 in | Wt 176.0 lb

## 2019-06-12 DIAGNOSIS — Z950 Presence of cardiac pacemaker: Secondary | ICD-10-CM | POA: Diagnosis not present

## 2019-06-12 DIAGNOSIS — I110 Hypertensive heart disease with heart failure: Secondary | ICD-10-CM | POA: Insufficient documentation

## 2019-06-12 DIAGNOSIS — I442 Atrioventricular block, complete: Secondary | ICD-10-CM | POA: Diagnosis not present

## 2019-06-12 DIAGNOSIS — D6869 Other thrombophilia: Secondary | ICD-10-CM

## 2019-06-12 DIAGNOSIS — I2510 Atherosclerotic heart disease of native coronary artery without angina pectoris: Secondary | ICD-10-CM | POA: Insufficient documentation

## 2019-06-12 DIAGNOSIS — I4819 Other persistent atrial fibrillation: Secondary | ICD-10-CM

## 2019-06-12 DIAGNOSIS — I5032 Chronic diastolic (congestive) heart failure: Secondary | ICD-10-CM | POA: Insufficient documentation

## 2019-06-12 MED ORDER — METOLAZONE 2.5 MG PO TABS: ORAL_TABLET | ORAL | 5 refills | Status: DC

## 2019-06-12 NOTE — Progress Notes (Signed)
Primary Care Physician: [name], MD Primary Cardiologist: Dr Percival Spanish Primary Electrophysiologist: Dr Rayann Heman Referring Physician: Dr Calla Kicks is a 86 y.o. female with a history of CHB, non obstructive CAD, HTN, HLD, hearing loss, hypothyroidism, and persistent atrial fibrillation who presents for follow up in the [facility].  The patient was initially diagnosed with atrial fibrillation remotely but AF burden on her device interrogation has previously always been <1%. However, she appears to be in persistent afib since 11/16/18 per device clinic. She does have symptoms of increased fatigue and intermittent palpitations. She was advised to stop ASA and start Eliquis for a CHADS2VASC score of 5. She denies alcohol use or significant snoring. There were no specific triggers that she could identify. Patient is s/p DCCV on 01/04/19.   On follow up today, patient reports that she has been more fatigued and SOB over the last few weeks. She admits she ran out of metolazone and never got it refilled. She does have some abdominal swelling and trace leg edema. Per device report, she has been in afib since beginning of March. She describes feeling SOB at night but this has been chronic for her.  Today, she denies symptoms of palpitations, chest pain, orthopnea, PND, dizziness, presyncope, syncope, snoring, daytime somnolence, bleeding, or neurologic sequela. The patient is tolerating medications without difficulties and is otherwise without complaint today.   Atrial Fibrillation Risk Factors:  she does not have symptoms or diagnosis of sleep apnea. she does not have a history of alcohol use. The patient does have a history of early familial atrial fibrillation or other arrhythmias. Son has afib.  she has a BMI of Body mass index is 31.68 kg/m.Marland Kitchen Filed Weights   06/12/19 1058  Weight: 79.8 kg    Family History  Problem Relation Age of Onset  .  Coronary artery disease Other        family hx of 1st degree relative <50  . Diabetes Other        family hx of  . Other Other        cardiovascular disorder family hx of  . Other Other        neurological disorder family hx of  . Other Other        respiratory disease family hx of  . Heart attack Daughter   . Heart Problems Other        all children  . Sudden death Son   . Heart disease Brother   . Heart disease Sister   . Colon cancer Neg Hx   . Colon polyps Neg Hx      Atrial Fibrillation Management history:  Previous antiarrhythmic drugs: none Previous cardioversions: 01/16/19 Previous ablations: none CHADS2VASC score: 5 Anticoagulation history: Eliquis, Xarelto    Past Medical History:  Diagnosis Date  . Atrial fibrillation ([place]) 08/16/2016   observed on PPM interrogation, asymptomatic, chad2vasc score is 4.  will consider anticoagulation  . CHF (congestive heart failure) ([place])   . Colon polyps   . Coronary artery disease   . Diverticulosis   . Dyspnea   . GERD (gastroesophageal reflux disease)   . Hearing loss of both ears   . Hepatic cyst   . Hiatal hernia   . Hyperlipidemia   . Hypertension   . Hypothyroidism   . Low back pain   . Mitral valve disorders(424.0)   . Other left bundle branch block   . Palpitations   .  URI (upper respiratory infection)    Past Surgical History:  Procedure Laterality Date  . CARDIAC CATHETERIZATION  04-18-01  . CARDIOVERSION N/A 01/16/2019   Procedure: CARDIOVERSION;  Surgeon: Josue Hector, MD;  Location[facility] ENDOSCOPY;  Service: Cardiovascular;  Laterality: N/A;  . COLONOSCOPY    . HERNIA REPAIR    . PACEMAKER IMPLANT Left 05/15/2016   SJM Assirity MRI dual chamber PPM implanted by Dr Rayann Heman for CHB  . THYROIDECTOMY    . TONSILLECTOMY AND ADENOIDECTOMY    . TOTAL ABDOMINAL HYSTERECTOMY W/ BILATERAL SALPINGOOPHORECTOMY      Current Outpatient Medications  Medication Sig Dispense Refill  . albuterol (PROVENTIL  HFA;VENTOLIN HFA) 108 (90 Base) MCG/ACT inhaler Inhale 1-2 puffs into the lungs every 6 (six) hours as needed for wheezing or shortness of breath. 1 Inhaler 0  . BIOTIN PO Take 1 tablet by mouth daily.    . bisoprolol (ZEBETA) 5 MG tablet Take 1 tablet by mouth once daily 90 tablet 0  . Calcium Carb-Cholecalciferol (CALCIUM/VITAMIN D PO) Take 1 tablet by mouth 2 (two) times daily.     . calcium carbonate (TUMS - DOSED IN MG ELEMENTAL CALCIUM) 500 MG chewable tablet Chew 1 tablet by mouth daily as needed for indigestion or heartburn.    . Fluticasone-Umeclidin-Vilant (TRELEGY ELLIPTA) 100-62.5-25 MCG/INH AEPB Inhale 1 puff into the lungs daily. 60 each 3  . furosemide (LASIX) 40 MG tablet Take 1 tablet (40 mg total) by mouth daily. 90 tablet 3  . metolazone (ZAROXOLYN) 2.5 MG tablet TAKE ONE TABLET BY MOUTH THREE TIMES A WEEK 30 tablet 5  . Multiple Vitamins-Minerals (CENTRUM SILVER PO) Take 1 tablet by mouth daily.    Marland Kitchen SYNTHROID 88 MCG tablet TAKE 1 TABLET BY MOUTH ONCE DAILY BEFORE BREAKFAST 90 tablet 0  . vitamin C (ASCORBIC ACID) 500 MG tablet Take 500 mg by mouth daily.    Alveda Reasons 15 MG TABS tablet TAKE 1 TABLET BY MOUTH ONCE DAILY WITH SUPPER 30 tablet 6   No current facility-administered medications for this encounter.    Allergies  Allergen Reactions  . Adhesive [Tape] Itching, Dermatitis, Rash and Other (See Comments)    Blisters and "skin bubbles"  . Ace Inhibitors Cough  . Atorvastatin Other (See Comments)    REACTION: Reaction not known  . Clindamycin Other (See Comments)    Unknown  . Codeine Other (See Comments)    hallucinations   . Latex Other (See Comments)    blisters  . Shellfish Allergy Other (See Comments)    "gallbladder attack"  . Simvastatin Other (See Comments)    REACTION: fatigue  . Penicillins Rash    Has patient had a PCN reaction causing immediate rash, facial/tongue/throat swelling, SOB or lightheadedness with hypotension: Unknown Has patient had a  PCN reaction causing severe rash involving mucus membranes or skin necrosis: Unknown Has patient had a PCN reaction that required hospitalization: Unknown Has patient had a PCN reaction occurring within the last 10 years: No If all of the above answers are "NO", then may proceed with Cephalosporin use.     Social History   Socioeconomic History  . Marital status: Widowed    Spouse name: Not on file  . Number of children: 4  . Years of education: Not on file  . Highest education level: Not on file  Occupational History  . Occupation: retired  Tobacco Use  . Smoking status: Never Smoker  . Smokeless tobacco: Never Used  Substance and Sexual Activity  .  Alcohol use: No  . Drug use: No  . Sexual activity: Never  Other Topics Concern  . Not on file  Social History Narrative  . Not on file   Social Determinants of Health   Financial Resource Strain:   . Difficulty of Paying Living Expenses:   Food Insecurity:   . Worried About Charity fundraiser in the Last Year:   . Arboriculturist in the Last Year:   Transportation Needs:   . Film/video editor (Medical):   Marland Kitchen Lack of Transportation (Non-Medical):   Physical Activity: Sufficiently Active  . Days of Exercise per Week: 5 days  . Minutes of Exercise per Session: 40 min  Stress:   . Feeling of Stress :   Social Connections:   . Frequency of Communication with Friends and Family:   . Frequency of Social Gatherings with Friends and Family:   . Attends Religious Services:   . Active Member of Clubs or Organizations:   . Attends Archivist Meetings:   Marland Kitchen Marital Status:   Intimate Partner Violence:   . Fear of Current or Ex-Partner:   . Emotionally Abused:   Marland Kitchen Physically Abused:   . Sexually Abused:      ROS- All systems are reviewed and negative except as per the HPI above.  Physical Exam: Vitals:   06/12/19 1058  BP: 138/72  Pulse: 70  SpO2: 96%  Weight: 79.8 kg  Height: 5' 2.5" (1.588 m)     GEN- The patient is well appearing obese elderly female, alert and oriented x 3 today.   HEENT-head normocephalic, atraumatic, sclera clear, conjunctiva pink, hearing intact, trachea midline. Lungs- Clear to ausculation bilaterally, normal work of breathing Heart- Regular rate and rhythm, no murmurs, rubs or gallops  GI- soft, NT, ND, + BS Extremities- no clubbing, cyanosis. Trace bilateral edema MS- no significant deformity or atrophy Skin- no rash or lesion Psych- euthymic mood, full affect Neuro- strength and sensation are intact   Wt Readings from Last 3 Encounters:  06/12/19 79.8 kg  02/06/19 75.3 kg  02/05/19 76.7 kg    EKG today demonstrates V paced rhythm underlying afib HR 70, QRS 154, QTc 503  Echo 06/28/18 demonstrated  - Left ventricle: The cavity size was normal. There was moderate   focal basal hypertrophy of the septum. Systolic function was   normal. The estimated ejection fraction was in the range of 60%   to 65%. Wall motion was normal; there were no regional wall   motion abnormalities. Doppler parameters are consistent with   abnormal left ventricular relaxation (grade 1 diastolic   dysfunction). - Aortic valve: Trileaflet; mildly thickened, mildly calcified   leaflets. There was trivial regurgitation. - Mitral valve: There was mild regurgitation. - Left atrium: Volume/bsa, S: 32.7 ml/m^2. - Tricuspid valve: There was mild regurgitation.  Impressions:  - EF is improved when compared to prior study.  Epic records are reviewed at length today  Assessment and Plan:  1. Persistent atrial fibrillation S/p DCCV on 01/16/19. We discussed therapeutic options today including DCCV +/- AAD. Patient does not want to pursue a DCCV for now. She is also very apprehensive about starting new medications. We discussed a rate control strategy today given she felt only minimal improvement after her last DCCV and her advanced age. Patient and family would like to treat  conservatively for now. Continue Xarelto 15 mg daily (CrCl <32mLmin).   This patients CHA2DS2-VASc Score and unadjusted Ischemic Stroke Rate (%  per year) is equal to 7.2 % stroke rate/year from a score of 5  Above score calculated as 1 point each if present [CHF, HTN, DM, Vascular=MI/PAD/Aortic Plaque, Age if 65-74, or Female] Above score calculated as 2 points each if present [Age > 75, or Stroke/TIA/TE]  2. CHB S/p PPM, followed by Dr Rayann Heman and the device clinic.   3. HTN Stable, no changes today.  4. Chronic diastolic dysfunction Her weight is up with more symptoms of dyspnea and abdominal edema. She cannot recall when she self-stopped metolazone. Encouraged her to pick up her Rx for metolazone and resume at previous dose.  Recheck bmet on f/u.  5. CAD No anginal symptoms. Followed by Dr Percival Spanish.    Follow up in the AF clinic in one week.    [facility] [address] [place], [place] [address] [phone] 06/12/2019 1:10 PM

## 2019-06-19 ENCOUNTER — Other Ambulatory Visit (HOSPITAL_COMMUNITY): Payer: Self-pay | Admitting: *Deleted

## 2019-06-19 ENCOUNTER — Ambulatory Visit (HOSPITAL_COMMUNITY)
Admission: RE | Admit: 2019-06-19 | Discharge: 2019-06-19 | Disposition: A | Discharge status: Home / Self Care | Payer: PPO | Source: Ambulatory Visit | Attending: Physician Assistant | Admitting: Physician Assistant

## 2019-06-19 ENCOUNTER — Other Ambulatory Visit: Payer: Self-pay

## 2019-06-19 VITALS — BP 118/70 | HR 70 | Ht 62.5 in | Wt 169.2 lb

## 2019-06-19 DIAGNOSIS — I110 Hypertensive heart disease with heart failure: Secondary | ICD-10-CM | POA: Diagnosis not present

## 2019-06-19 DIAGNOSIS — I2510 Atherosclerotic heart disease of native coronary artery without angina pectoris: Secondary | ICD-10-CM | POA: Insufficient documentation

## 2019-06-19 DIAGNOSIS — I4819 Other persistent atrial fibrillation: Secondary | ICD-10-CM | POA: Diagnosis not present

## 2019-06-19 DIAGNOSIS — I509 Heart failure, unspecified: Secondary | ICD-10-CM | POA: Diagnosis not present

## 2019-06-19 DIAGNOSIS — Z7901 Long term (current) use of anticoagulants: Secondary | ICD-10-CM | POA: Insufficient documentation

## 2019-06-19 DIAGNOSIS — E039 Hypothyroidism, unspecified: Secondary | ICD-10-CM | POA: Diagnosis not present

## 2019-06-19 DIAGNOSIS — E785 Hyperlipidemia, unspecified: Secondary | ICD-10-CM | POA: Insufficient documentation

## 2019-06-19 DIAGNOSIS — Z79899 Other long term (current) drug therapy: Secondary | ICD-10-CM | POA: Diagnosis not present

## 2019-06-19 DIAGNOSIS — D6869 Other thrombophilia: Secondary | ICD-10-CM | POA: Diagnosis not present

## 2019-06-19 LAB — BASIC METABOLIC PANEL
Anion gap: 16 — ABNORMAL HIGH (ref 5–15)
Anion gap: 16 — ABNORMAL HIGH (ref 5–15)
BUN: 34 mg/dL — ABNORMAL HIGH (ref 8–23)
BUN: 33 mg/dL — ABNORMAL HIGH (ref 8–23)
CO2: 19 mmol/L — ABNORMAL LOW (ref 22–32)
CO2: 23 mmol/L (ref 22–32)
Calcium: 9.5 mg/dL (ref 8.9–10.3)
Calcium: 9.7 mg/dL (ref 8.9–10.3)
Chloride: 102 mmol/L (ref 98–111)
Chloride: 99 mmol/L (ref 98–111)
Creatinine, Ser: 1.11 mg/dL — ABNORMAL HIGH (ref 0.44–1.00)
Creatinine, Ser: 1.1 mg/dL — ABNORMAL HIGH (ref 0.44–1.00)
GFR calc Af Amer: 50 mL/min — ABNORMAL LOW (ref >60)
GFR calc Af Amer: 50 mL/min — ABNORMAL LOW (ref >60)
GFR calc non Af Amer: 43 mL/min — ABNORMAL LOW (ref >60)
GFR calc non Af Amer: 44 mL/min — ABNORMAL LOW (ref >60)
Glucose, Bld: 109 mg/dL — ABNORMAL HIGH (ref 70–99)
Glucose, Bld: 116 mg/dL — ABNORMAL HIGH (ref 70–99)
Potassium: 4.1 mmol/L (ref 3.5–5.1)
Potassium: 3.4 mmol/L — ABNORMAL LOW (ref 3.5–5.1)
Sodium: 137 mmol/L (ref 135–145)
Sodium: 138 mmol/L (ref 135–145)

## 2019-06-19 MED ORDER — POTASSIUM CHLORIDE CRYS ER 10 MEQ PO TBCR: EXTENDED_RELEASE_TABLET | ORAL | 3 refills | Status: DC

## 2019-06-19 NOTE — Progress Notes (Signed)
Primary Care Physician: [name], MD Primary Cardiologist: Dr Percival Spanish Primary Electrophysiologist: Dr Rayann Heman Referring Physician: Dr Calla Kicks is a 86 y.o. female with a history of CHB, non obstructive CAD, HTN, HLD, hearing loss, hypothyroidism, and persistent atrial fibrillation who presents for follow up in the [facility].  The patient was initially diagnosed with atrial fibrillation remotely but AF burden on her device interrogation has previously always been <1%. However, she appears to be in persistent afib since 11/16/18 per device clinic. She does have symptoms of increased fatigue and intermittent palpitations. She was advised to stop ASA and start Eliquis for a CHADS2VASC score of 5. She denies alcohol use or significant snoring. There were no specific triggers that she could identify. Patient is s/p DCCV on 01/04/19.   On follow up today, patient reports she is doing better than her last visit. She resumed the metolazone and has lost ~6-7 lbs. She reports her breathing is better and she has more energy despite remaining in afib. She denies bleeding issues on anticoagulation.   Today, she denies symptoms of palpitations, chest pain, orthopnea, PND, dizziness, presyncope, syncope, snoring, daytime somnolence, bleeding, or neurologic sequela. The patient is tolerating medications without difficulties and is otherwise without complaint today.   Atrial Fibrillation Risk Factors:  she does not have symptoms or diagnosis of sleep apnea. she does not have a history of alcohol use. The patient does have a history of early familial atrial fibrillation or other arrhythmias. Son has afib.  she has a BMI of Body mass index is 30.45 kg/m.Marland Kitchen Filed Weights   06/19/19 1102  Weight: 76.7 kg    Family History  Problem Relation Age of Onset  . Coronary artery disease Other        family hx of 1st degree relative <50  . Diabetes Other    family hx of  . Other Other        cardiovascular disorder family hx of  . Other Other        neurological disorder family hx of  . Other Other        respiratory disease family hx of  . Heart attack Daughter   . Heart Problems Other        all children  . Sudden death Son   . Heart disease Brother   . Heart disease Sister   . Colon cancer Neg Hx   . Colon polyps Neg Hx      Atrial Fibrillation Management history:  Previous antiarrhythmic drugs: none Previous cardioversions: 01/16/19 Previous ablations: none CHADS2VASC score: 5 Anticoagulation history: Eliquis, Xarelto    Past Medical History:  Diagnosis Date  . Atrial fibrillation ([place]) 08/16/2016   observed on PPM interrogation, asymptomatic, chad2vasc score is 4.  will consider anticoagulation  . CHF (congestive heart failure) ([place])   . Colon polyps   . Coronary artery disease   . Diverticulosis   . Dyspnea   . GERD (gastroesophageal reflux disease)   . Hearing loss of both ears   . Hepatic cyst   . Hiatal hernia   . Hyperlipidemia   . Hypertension   . Hypothyroidism   . Low back pain   . Mitral valve disorders(424.0)   . Other left bundle branch block   . Palpitations   . URI (upper respiratory infection)    Past Surgical History:  Procedure Laterality Date  . CARDIAC CATHETERIZATION  04-18-01  . CARDIOVERSION N/A 01/16/2019  Procedure: CARDIOVERSION;  Surgeon: Josue Hector, MD;  Location: [facility] ENDOSCOPY;  Service: Cardiovascular;  Laterality: N/A;  . COLONOSCOPY    . HERNIA REPAIR    . PACEMAKER IMPLANT Left 05/15/2016   SJM Assirity MRI dual chamber PPM implanted by Dr Rayann Heman for CHB  . THYROIDECTOMY    . TONSILLECTOMY AND ADENOIDECTOMY    . TOTAL ABDOMINAL HYSTERECTOMY W/ BILATERAL SALPINGOOPHORECTOMY      Current Outpatient Medications  Medication Sig Dispense Refill  . albuterol (PROVENTIL HFA;VENTOLIN HFA) 108 (90 Base) MCG/ACT inhaler Inhale 1-2 puffs into the lungs every 6 (six) hours as  needed for wheezing or shortness of breath. 1 Inhaler 0  . BIOTIN PO Take 1 tablet by mouth daily.    . bisoprolol (ZEBETA) 5 MG tablet Take 1 tablet by mouth once daily 90 tablet 0  . Calcium Carb-Cholecalciferol (CALCIUM/VITAMIN D PO) Take 1 tablet by mouth 2 (two) times daily.     . calcium carbonate (TUMS - DOSED IN MG ELEMENTAL CALCIUM) 500 MG chewable tablet Chew 1 tablet by mouth daily as needed for indigestion or heartburn.    . Fluticasone-Umeclidin-Vilant (TRELEGY ELLIPTA) 100-62.5-25 MCG/INH AEPB Inhale 1 puff into the lungs daily. 60 each 3  . furosemide (LASIX) 40 MG tablet Take 1 tablet (40 mg total) by mouth daily. 90 tablet 3  . metolazone (ZAROXOLYN) 2.5 MG tablet TAKE ONE TABLET BY MOUTH THREE TIMES A WEEK 30 tablet 5  . Multiple Vitamins-Minerals (CENTRUM SILVER PO) Take 1 tablet by mouth daily.    Marland Kitchen SYNTHROID 88 MCG tablet TAKE 1 TABLET BY MOUTH ONCE DAILY BEFORE BREAKFAST 90 tablet 0  . vitamin C (ASCORBIC ACID) 500 MG tablet Take 500 mg by mouth daily.    Alveda Reasons 15 MG TABS tablet TAKE 1 TABLET BY MOUTH ONCE DAILY WITH SUPPER 30 tablet 6   No current facility-administered medications for this encounter.    Allergies  Allergen Reactions  . Adhesive [Tape] Itching, Dermatitis, Rash and Other (See Comments)    Blisters and "skin bubbles"  . Ace Inhibitors Cough  . Atorvastatin Other (See Comments)    REACTION: Reaction not known  . Clindamycin Other (See Comments)    Unknown  . Codeine Other (See Comments)    hallucinations   . Latex Other (See Comments)    blisters  . Shellfish Allergy Other (See Comments)    "gallbladder attack"  . Simvastatin Other (See Comments)    REACTION: fatigue  . Penicillins Rash    Has patient had a PCN reaction causing immediate rash, facial/tongue/throat swelling, SOB or lightheadedness with hypotension: Unknown Has patient had a PCN reaction causing severe rash involving mucus membranes or skin necrosis: Unknown Has patient had a  PCN reaction that required hospitalization: Unknown Has patient had a PCN reaction occurring within the last 10 years: No If all of the above answers are "NO", then may proceed with Cephalosporin use.     Social History   Socioeconomic History  . Marital status: Widowed    Spouse name: Not on file  . Number of children: 4  . Years of education: Not on file  . Highest education level: Not on file  Occupational History  . Occupation: retired  Tobacco Use  . Smoking status: Never Smoker  . Smokeless tobacco: Never Used  Substance and Sexual Activity  . Alcohol use: No  . Drug use: No  . Sexual activity: Never  Other Topics Concern  . Not on file  Social History  Narrative  . Not on file   Social Determinants of Health   Financial Resource Strain:   . Difficulty of Paying Living Expenses:   Food Insecurity:   . Worried About Charity fundraiser in the Last Year:   . Arboriculturist in the Last Year:   Transportation Needs:   . Film/video editor (Medical):   Marland Kitchen Lack of Transportation (Non-Medical):   Physical Activity: Sufficiently Active  . Days of Exercise per Week: 5 days  . Minutes of Exercise per Session: 40 min  Stress:   . Feeling of Stress :   Social Connections:   . Frequency of Communication with Friends and Family:   . Frequency of Social Gatherings with Friends and Family:   . Attends Religious Services:   . Active Member of Clubs or Organizations:   . Attends Archivist Meetings:   Marland Kitchen Marital Status:   Intimate Partner Violence:   . Fear of Current or Ex-Partner:   . Emotionally Abused:   Marland Kitchen Physically Abused:   . Sexually Abused:      ROS- All systems are reviewed and negative except as per the HPI above.  Physical Exam: Vitals:   06/19/19 1102  BP: 118/70  Pulse: 70  Weight: 76.7 kg  Height: 5' 2.5" (1.588 m)    GEN- The patient is well appearing obese elderly female, alert and oriented x 3 today.   HEENT-head normocephalic,  atraumatic, sclera clear, conjunctiva pink, hearing intact, trachea midline. Lungs- Clear to ausculation bilaterally, normal work of breathing Heart- Regular rate and rhythm, no murmurs, rubs or gallops  GI- soft, NT, ND, + BS Extremities- no clubbing, cyanosis, or edema MS- no significant deformity or atrophy Skin- no rash or lesion Psych- euthymic mood, full affect Neuro- strength and sensation are intact   Wt Readings from Last 3 Encounters:  06/19/19 76.7 kg  06/12/19 79.8 kg  02/06/19 75.3 kg    EKG today demonstrates V paced rhythm underlying afib HR 70, QRS 158, QTc 503  Echo 06/28/18 demonstrated  - Left ventricle: The cavity size was normal. There was moderate   focal basal hypertrophy of the septum. Systolic function was   normal. The estimated ejection fraction was in the range of 60%   to 65%. Wall motion was normal; there were no regional wall   motion abnormalities. Doppler parameters are consistent with   abnormal left ventricular relaxation (grade 1 diastolic   dysfunction). - Aortic valve: Trileaflet; mildly thickened, mildly calcified   leaflets. There was trivial regurgitation. - Mitral valve: There was mild regurgitation. - Left atrium: Volume/bsa, S: 32.7 ml/m^2. - Tricuspid valve: There was mild regurgitation.  Impressions:  - EF is improved when compared to prior study.  Epic records are reviewed at length today  Assessment and Plan:  1. Persistent atrial fibrillation We again discussed rate vs rhythm control today. It is the patient's desire to pursue rate control for now. She does not want to consider another DCCV. Continue Xarelto 15 mg daily (CrCl <74mLmin).  Continue bisoprolol 5 mg daily  This patients CHA2DS2-VASc Score and unadjusted Ischemic Stroke Rate (% per year) is equal to 7.2 % stroke rate/year from a score of 5  Above score calculated as 1 point each if present [CHF, HTN, DM, Vascular=MI/PAD/Aortic Plaque, Age if 65-74, or  Female] Above score calculated as 2 points each if present [Age > 75, or Stroke/TIA/TE]  2. CHB S/p PPM, followed by Dr Rayann Heman and the  device clinic.  3. HTN Stable, no changes today.  4. Chronic diastolic dysfunction Patient's weight is down since her last visit after resuming metolazone with symptom improvement.  Recheck bmet today.  5. CAD No anginal symptoms.   Follow up with Dr Rayann Heman per recall. Patient declined sooner appointment with AF clinic.    [facility] [address] [place], [place] [address] [phone] 06/19/2019 11:51 AM

## 2019-07-19 ENCOUNTER — Other Ambulatory Visit: Payer: Self-pay

## 2019-07-19 ENCOUNTER — Inpatient Hospital Stay (HOSPITAL_COMMUNITY)
Admission: EM | Admit: 2019-07-19 | Discharge: 2019-07-23 | DRG: 871 | Disposition: A | Discharge status: Home Health | Payer: PPO | Attending: Internal Medicine | Admitting: Internal Medicine

## 2019-07-19 ENCOUNTER — Emergency Department (HOSPITAL_COMMUNITY): Payer: PPO

## 2019-07-19 ENCOUNTER — Encounter (HOSPITAL_COMMUNITY): Payer: Self-pay | Admitting: Emergency Medicine

## 2019-07-19 ENCOUNTER — Inpatient Hospital Stay (HOSPITAL_COMMUNITY): Payer: PPO

## 2019-07-19 DIAGNOSIS — K831 Obstruction of bile duct: Secondary | ICD-10-CM | POA: Diagnosis not present

## 2019-07-19 DIAGNOSIS — E872 Acidosis: Secondary | ICD-10-CM | POA: Diagnosis present

## 2019-07-19 DIAGNOSIS — R1084 Generalized abdominal pain: Secondary | ICD-10-CM | POA: Diagnosis not present

## 2019-07-19 DIAGNOSIS — I130 Hypertensive heart and chronic kidney disease with heart failure and stage 1 through stage 4 chronic kidney disease, or unspecified chronic kidney disease: Secondary | ICD-10-CM | POA: Diagnosis not present

## 2019-07-19 DIAGNOSIS — Z91013 Allergy to seafood: Secondary | ICD-10-CM

## 2019-07-19 DIAGNOSIS — H9193 Unspecified hearing loss, bilateral: Secondary | ICD-10-CM | POA: Diagnosis not present

## 2019-07-19 DIAGNOSIS — E890 Postprocedural hypothyroidism: Secondary | ICD-10-CM | POA: Diagnosis present

## 2019-07-19 DIAGNOSIS — I4819 Other persistent atrial fibrillation: Secondary | ICD-10-CM | POA: Diagnosis present

## 2019-07-19 DIAGNOSIS — E1122 Type 2 diabetes mellitus with diabetic chronic kidney disease: Secondary | ICD-10-CM | POA: Diagnosis not present

## 2019-07-19 DIAGNOSIS — R7989 Other specified abnormal findings of blood chemistry: Secondary | ICD-10-CM

## 2019-07-19 DIAGNOSIS — K5730 Diverticulosis of large intestine without perforation or abscess without bleeding: Secondary | ICD-10-CM | POA: Diagnosis not present

## 2019-07-19 DIAGNOSIS — E871 Hypo-osmolality and hyponatremia: Secondary | ICD-10-CM | POA: Diagnosis not present

## 2019-07-19 DIAGNOSIS — I2510 Atherosclerotic heart disease of native coronary artery without angina pectoris: Secondary | ICD-10-CM | POA: Diagnosis not present

## 2019-07-19 DIAGNOSIS — R8271 Bacteriuria: Secondary | ICD-10-CM | POA: Diagnosis present

## 2019-07-19 DIAGNOSIS — I4891 Unspecified atrial fibrillation: Secondary | ICD-10-CM | POA: Diagnosis not present

## 2019-07-19 DIAGNOSIS — J45909 Unspecified asthma, uncomplicated: Secondary | ICD-10-CM | POA: Diagnosis present

## 2019-07-19 DIAGNOSIS — E119 Type 2 diabetes mellitus without complications: Secondary | ICD-10-CM | POA: Diagnosis not present

## 2019-07-19 DIAGNOSIS — K219 Gastro-esophageal reflux disease without esophagitis: Secondary | ICD-10-CM | POA: Diagnosis present

## 2019-07-19 DIAGNOSIS — Z20822 Contact with and (suspected) exposure to covid-19: Secondary | ICD-10-CM | POA: Diagnosis present

## 2019-07-19 DIAGNOSIS — K8050 Calculus of bile duct without cholangitis or cholecystitis without obstruction: Secondary | ICD-10-CM | POA: Diagnosis not present

## 2019-07-19 DIAGNOSIS — R109 Unspecified abdominal pain: Secondary | ICD-10-CM | POA: Diagnosis not present

## 2019-07-19 DIAGNOSIS — I5043 Acute on chronic combined systolic (congestive) and diastolic (congestive) heart failure: Secondary | ICD-10-CM

## 2019-07-19 DIAGNOSIS — Z7901 Long term (current) use of anticoagulants: Secondary | ICD-10-CM

## 2019-07-19 DIAGNOSIS — K810 Acute cholecystitis: Secondary | ICD-10-CM | POA: Diagnosis present

## 2019-07-19 DIAGNOSIS — E785 Hyperlipidemia, unspecified: Secondary | ICD-10-CM | POA: Diagnosis present

## 2019-07-19 DIAGNOSIS — A419 Sepsis, unspecified organism: Secondary | ICD-10-CM | POA: Diagnosis not present

## 2019-07-19 DIAGNOSIS — D689 Coagulation defect, unspecified: Secondary | ICD-10-CM | POA: Diagnosis not present

## 2019-07-19 DIAGNOSIS — N1831 Chronic kidney disease, stage 3a: Secondary | ICD-10-CM | POA: Diagnosis present

## 2019-07-19 DIAGNOSIS — Z8249 Family history of ischemic heart disease and other diseases of the circulatory system: Secondary | ICD-10-CM

## 2019-07-19 DIAGNOSIS — Z91040 Latex allergy status: Secondary | ICD-10-CM

## 2019-07-19 DIAGNOSIS — Z888 Allergy status to other drugs, medicaments and biological substances status: Secondary | ICD-10-CM

## 2019-07-19 DIAGNOSIS — E876 Hypokalemia: Secondary | ICD-10-CM | POA: Diagnosis not present

## 2019-07-19 DIAGNOSIS — J8410 Pulmonary fibrosis, unspecified: Secondary | ICD-10-CM | POA: Diagnosis not present

## 2019-07-19 DIAGNOSIS — K8063 Calculus of gallbladder and bile duct with acute cholecystitis with obstruction: Secondary | ICD-10-CM | POA: Diagnosis not present

## 2019-07-19 DIAGNOSIS — I5042 Chronic combined systolic (congestive) and diastolic (congestive) heart failure: Secondary | ICD-10-CM

## 2019-07-19 DIAGNOSIS — Z950 Presence of cardiac pacemaker: Secondary | ICD-10-CM

## 2019-07-19 DIAGNOSIS — N179 Acute kidney failure, unspecified: Secondary | ICD-10-CM | POA: Diagnosis present

## 2019-07-19 DIAGNOSIS — Z881 Allergy status to other antibiotic agents status: Secondary | ICD-10-CM

## 2019-07-19 DIAGNOSIS — Z03818 Encounter for observation for suspected exposure to other biological agents ruled out: Secondary | ICD-10-CM | POA: Diagnosis not present

## 2019-07-19 DIAGNOSIS — R1011 Right upper quadrant pain: Secondary | ICD-10-CM | POA: Diagnosis not present

## 2019-07-19 DIAGNOSIS — Z79890 Hormone replacement therapy (postmenopausal): Secondary | ICD-10-CM

## 2019-07-19 DIAGNOSIS — Z01818 Encounter for other preprocedural examination: Secondary | ICD-10-CM

## 2019-07-19 DIAGNOSIS — I472 Ventricular tachycardia: Secondary | ICD-10-CM | POA: Diagnosis not present

## 2019-07-19 DIAGNOSIS — N390 Urinary tract infection, site not specified: Secondary | ICD-10-CM | POA: Diagnosis not present

## 2019-07-19 DIAGNOSIS — R52 Pain, unspecified: Secondary | ICD-10-CM | POA: Diagnosis not present

## 2019-07-19 DIAGNOSIS — R1111 Vomiting without nausea: Secondary | ICD-10-CM | POA: Diagnosis not present

## 2019-07-19 DIAGNOSIS — I5033 Acute on chronic diastolic (congestive) heart failure: Secondary | ICD-10-CM | POA: Diagnosis present

## 2019-07-19 DIAGNOSIS — R933 Abnormal findings on diagnostic imaging of other parts of digestive tract: Secondary | ICD-10-CM

## 2019-07-19 DIAGNOSIS — I5032 Chronic diastolic (congestive) heart failure: Secondary | ICD-10-CM | POA: Diagnosis not present

## 2019-07-19 DIAGNOSIS — R0602 Shortness of breath: Secondary | ICD-10-CM

## 2019-07-19 DIAGNOSIS — I447 Left bundle-branch block, unspecified: Secondary | ICD-10-CM | POA: Diagnosis not present

## 2019-07-19 DIAGNOSIS — Z885 Allergy status to narcotic agent status: Secondary | ICD-10-CM

## 2019-07-19 DIAGNOSIS — R1013 Epigastric pain: Secondary | ICD-10-CM | POA: Diagnosis not present

## 2019-07-19 DIAGNOSIS — Z880 Allergy status to penicillin: Secondary | ICD-10-CM

## 2019-07-19 DIAGNOSIS — E860 Dehydration: Secondary | ICD-10-CM | POA: Diagnosis present

## 2019-07-19 DIAGNOSIS — Z91048 Other nonmedicinal substance allergy status: Secondary | ICD-10-CM

## 2019-07-19 DIAGNOSIS — I442 Atrioventricular block, complete: Secondary | ICD-10-CM | POA: Diagnosis not present

## 2019-07-19 DIAGNOSIS — Z79899 Other long term (current) drug therapy: Secondary | ICD-10-CM

## 2019-07-19 DIAGNOSIS — R17 Unspecified jaundice: Secondary | ICD-10-CM | POA: Diagnosis not present

## 2019-07-19 DIAGNOSIS — Z833 Family history of diabetes mellitus: Secondary | ICD-10-CM

## 2019-07-19 DIAGNOSIS — I10 Essential (primary) hypertension: Secondary | ICD-10-CM | POA: Diagnosis not present

## 2019-07-19 DIAGNOSIS — Z7951 Long term (current) use of inhaled steroids: Secondary | ICD-10-CM

## 2019-07-19 DIAGNOSIS — E039 Hypothyroidism, unspecified: Secondary | ICD-10-CM | POA: Diagnosis not present

## 2019-07-19 LAB — COMPREHENSIVE METABOLIC PANEL
ALT: 79 U/L — ABNORMAL HIGH (ref 0–44)
ALT: 167 U/L — ABNORMAL HIGH (ref 0–44)
AST: 138 U/L — ABNORMAL HIGH (ref 15–41)
AST: 226 U/L — ABNORMAL HIGH (ref 15–41)
Albumin: 4 g/dL (ref 3.5–5.0)
Albumin: 3 g/dL — ABNORMAL LOW (ref 3.5–5.0)
Alkaline Phosphatase: 90 U/L (ref 38–126)
Alkaline Phosphatase: 84 U/L (ref 38–126)
Anion gap: 11 (ref 5–15)
Anion gap: 11 (ref 5–15)
BUN: 31 mg/dL — ABNORMAL HIGH (ref 8–23)
BUN: 25 mg/dL — ABNORMAL HIGH (ref 8–23)
CO2: 28 mmol/L (ref 22–32)
CO2: 23 mmol/L (ref 22–32)
Calcium: 9 mg/dL (ref 8.9–10.3)
Calcium: 7.6 mg/dL — ABNORMAL LOW (ref 8.9–10.3)
Chloride: 94 mmol/L — ABNORMAL LOW (ref 98–111)
Chloride: 104 mmol/L (ref 98–111)
Creatinine, Ser: 1.26 mg/dL — ABNORMAL HIGH (ref 0.44–1.00)
Creatinine, Ser: 0.97 mg/dL (ref 0.44–1.00)
GFR calc Af Amer: 43 mL/min — ABNORMAL LOW (ref >60)
GFR calc Af Amer: 59 mL/min — ABNORMAL LOW (ref >60)
GFR calc non Af Amer: 37 mL/min — ABNORMAL LOW (ref >60)
GFR calc non Af Amer: 51 mL/min — ABNORMAL LOW (ref >60)
Glucose, Bld: 158 mg/dL — ABNORMAL HIGH (ref 70–99)
Glucose, Bld: 159 mg/dL — ABNORMAL HIGH (ref 70–99)
Potassium: 3.7 mmol/L (ref 3.5–5.1)
Potassium: 3.1 mmol/L — ABNORMAL LOW (ref 3.5–5.1)
Sodium: 133 mmol/L — ABNORMAL LOW (ref 135–145)
Sodium: 138 mmol/L (ref 135–145)
Total Bilirubin: 1.5 mg/dL — ABNORMAL HIGH (ref 0.3–1.2)
Total Bilirubin: 2.5 mg/dL — ABNORMAL HIGH (ref 0.3–1.2)
Total Protein: 7.1 g/dL (ref 6.5–8.1)
Total Protein: 5.4 g/dL — ABNORMAL LOW (ref 6.5–8.1)

## 2019-07-19 LAB — CBC WITH DIFFERENTIAL/PLATELET
Abs Immature Granulocytes: 0.03 10*3/uL (ref 0.00–0.07)
Abs Immature Granulocytes: 0.04 10*3/uL (ref 0.00–0.07)
Basophils Absolute: 0 10*3/uL (ref 0.0–0.1)
Basophils Absolute: 0 10*3/uL (ref 0.0–0.1)
Basophils Relative: 0 %
Basophils Relative: 0 %
Eosinophils Absolute: 0.1 10*3/uL (ref 0.0–0.5)
Eosinophils Absolute: 0 10*3/uL (ref 0.0–0.5)
Eosinophils Relative: 1 %
Eosinophils Relative: 0 %
HCT: 38.7 % (ref 36.0–46.0)
HCT: 35.3 % — ABNORMAL LOW (ref 36.0–46.0)
Hemoglobin: 12.6 g/dL (ref 12.0–15.0)
Hemoglobin: 11.3 g/dL — ABNORMAL LOW (ref 12.0–15.0)
Immature Granulocytes: 0 %
Immature Granulocytes: 0 %
Lymphocytes Relative: 11 %
Lymphocytes Relative: 5 %
Lymphs Abs: 0.9 10*3/uL (ref 0.7–4.0)
Lymphs Abs: 0.5 10*3/uL — ABNORMAL LOW (ref 0.7–4.0)
MCH: 31.7 pg (ref 26.0–34.0)
MCH: 31 pg (ref 26.0–34.0)
MCHC: 32.6 g/dL (ref 30.0–36.0)
MCHC: 32 g/dL (ref 30.0–36.0)
MCV: 97.5 fL (ref 80.0–100.0)
MCV: 97 fL (ref 80.0–100.0)
Monocytes Absolute: 0.3 10*3/uL (ref 0.1–1.0)
Monocytes Absolute: 0.6 10*3/uL (ref 0.1–1.0)
Monocytes Relative: 3 %
Monocytes Relative: 6 %
Neutro Abs: 7.1 10*3/uL (ref 1.7–7.7)
Neutro Abs: 9.3 10*3/uL — ABNORMAL HIGH (ref 1.7–7.7)
Neutrophils Relative %: 85 %
Neutrophils Relative %: 89 %
Platelets: 174 10*3/uL (ref 150–400)
Platelets: 156 10*3/uL (ref 150–400)
RBC: 3.97 MIL/uL (ref 3.87–5.11)
RBC: 3.64 MIL/uL — ABNORMAL LOW (ref 3.87–5.11)
RDW: 13.7 % (ref 11.5–15.5)
RDW: 14 % (ref 11.5–15.5)
WBC: 8.3 10*3/uL (ref 4.0–10.5)
WBC: 10.5 10*3/uL (ref 4.0–10.5)
nRBC: 0 % (ref 0.0–0.2)
nRBC: 0 % (ref 0.0–0.2)

## 2019-07-19 LAB — APTT
aPTT: 33 seconds (ref 24–36)
aPTT: 30 seconds (ref 24–36)

## 2019-07-19 LAB — LACTIC ACID, PLASMA
Lactic Acid, Venous: 4.7 mmol/L (ref 0.5–1.9)
Lactic Acid, Venous: 4 mmol/L (ref 0.5–1.9)
Lactic Acid, Venous: 3 mmol/L (ref 0.5–1.9)

## 2019-07-19 LAB — URINALYSIS, ROUTINE W REFLEX MICROSCOPIC
Bilirubin Urine: NEGATIVE
Glucose, UA: NEGATIVE mg/dL
Hgb urine dipstick: NEGATIVE
Ketones, ur: NEGATIVE mg/dL
Nitrite: NEGATIVE
Protein, ur: 100 mg/dL — AB
Specific Gravity, Urine: 1.018 (ref 1.005–1.030)
WBC, UA: >50 WBC/hpf — ABNORMAL HIGH (ref 0–5)
pH: 6 (ref 5.0–8.0)

## 2019-07-19 LAB — HEPATIC FUNCTION PANEL
ALT: 166 U/L — ABNORMAL HIGH (ref 0–44)
AST: 226 U/L — ABNORMAL HIGH (ref 15–41)
Albumin: 3.1 g/dL — ABNORMAL LOW (ref 3.5–5.0)
Alkaline Phosphatase: 87 U/L (ref 38–126)
Bilirubin, Direct: 1.2 mg/dL — ABNORMAL HIGH (ref 0.0–0.2)
Indirect Bilirubin: 1.3 mg/dL — ABNORMAL HIGH (ref 0.3–0.9)
Total Bilirubin: 2.5 mg/dL — ABNORMAL HIGH (ref 0.3–1.2)
Total Protein: 5.7 g/dL — ABNORMAL LOW (ref 6.5–8.1)

## 2019-07-19 LAB — PROTIME-INR
INR: 1.6 — ABNORMAL HIGH (ref 0.8–1.2)
INR: 1.5 — ABNORMAL HIGH (ref 0.8–1.2)
Prothrombin Time: 18.7 seconds — ABNORMAL HIGH (ref 11.4–15.2)
Prothrombin Time: 17.1 seconds — ABNORMAL HIGH (ref 11.4–15.2)

## 2019-07-19 LAB — TSH: TSH: 2.021 u[IU]/mL (ref 0.350–4.500)

## 2019-07-19 LAB — LIPASE, BLOOD: Lipase: 27 U/L (ref 11–51)

## 2019-07-19 LAB — SARS CORONAVIRUS 2 BY RT PCR (HOSPITAL ORDER, PERFORMED IN ~~LOC~~ HOSPITAL LAB): SARS Coronavirus 2: NEGATIVE

## 2019-07-19 LAB — BRAIN NATRIURETIC PEPTIDE: B Natriuretic Peptide: 467.4 pg/mL — ABNORMAL HIGH (ref 0.0–100.0)

## 2019-07-19 MED ORDER — ALBUTEROL SULFATE (2.5 MG/3ML) 0.083% IN NEBU: 2.5000 mg | INHALATION_SOLUTION | Freq: Four times a day (QID) | RESPIRATORY_TRACT | Status: DC | PRN

## 2019-07-19 MED ORDER — FENTANYL CITRATE (PF) 100 MCG/2ML IJ SOLN
50.0000 ug | Freq: Once | INTRAMUSCULAR | Status: AC
  Administered 2019-07-19: 50 ug via INTRAVENOUS
  Filled 2019-07-19: qty 2

## 2019-07-19 MED ORDER — ONDANSETRON HCL 4 MG/2ML IJ SOLN
4.0000 mg | Freq: Once | INTRAMUSCULAR | Status: AC
  Administered 2019-07-19: 4 mg via INTRAVENOUS
  Filled 2019-07-19: qty 2

## 2019-07-19 MED ORDER — LACTATED RINGERS IV BOLUS (SEPSIS)
1000.0000 mL | Freq: Once | INTRAVENOUS | Status: AC
  Administered 2019-07-19: 1000 mL via INTRAVENOUS

## 2019-07-19 MED ORDER — SODIUM CHLORIDE 0.9 % IV SOLN: INTRAVENOUS | Status: AC

## 2019-07-19 MED ORDER — SODIUM CHLORIDE (PF) 0.9 % IJ SOLN
INTRAMUSCULAR | Status: AC
  Filled 2019-07-19: qty 50

## 2019-07-19 MED ORDER — TECHNETIUM TC 99M MEBROFENIN IV KIT
5.3000 | PACK | Freq: Once | INTRAVENOUS | Status: AC
  Administered 2019-07-19: 5.3 via INTRAVENOUS

## 2019-07-19 MED ORDER — UMECLIDINIUM BROMIDE 62.5 MCG/INH IN AEPB
1.0000 | INHALATION_SPRAY | Freq: Every day | RESPIRATORY_TRACT | Status: DC
  Administered 2019-07-20 – 2019-07-23 (×3): 1 via RESPIRATORY_TRACT
  Filled 2019-07-19: qty 7

## 2019-07-19 MED ORDER — PANTOPRAZOLE SODIUM 40 MG PO TBEC
40.0000 mg | DELAYED_RELEASE_TABLET | Freq: Once | ORAL | Status: AC
  Administered 2019-07-19: 40 mg via ORAL
  Filled 2019-07-19: qty 1

## 2019-07-19 MED ORDER — SODIUM CHLORIDE 0.9 % IV SOLN
2.0000 g | INTRAVENOUS | Status: DC
  Administered 2019-07-20 – 2019-07-23 (×4): 2 g via INTRAVENOUS
  Filled 2019-07-19 (×4): qty 2

## 2019-07-19 MED ORDER — BISOPROLOL FUMARATE 5 MG PO TABS
5.0000 mg | ORAL_TABLET | Freq: Every day | ORAL | Status: DC
  Administered 2019-07-19 – 2019-07-23 (×5): 5 mg via ORAL
  Filled 2019-07-19 (×6): qty 1

## 2019-07-19 MED ORDER — METRONIDAZOLE IN NACL 5-0.79 MG/ML-% IV SOLN
500.0000 mg | Freq: Three times a day (TID) | INTRAVENOUS | Status: DC
  Administered 2019-07-19 – 2019-07-23 (×11): 500 mg via INTRAVENOUS
  Filled 2019-07-19 (×11): qty 100

## 2019-07-19 MED ORDER — FLUTICASONE-UMECLIDIN-VILANT 100-62.5-25 MCG/INH IN AEPB: 1.0000 | INHALATION_SPRAY | Freq: Every day | RESPIRATORY_TRACT | Status: DC

## 2019-07-19 MED ORDER — MORPHINE SULFATE (PF) 2 MG/ML IV SOLN: 2.0000 mg | INTRAVENOUS | Status: DC | PRN

## 2019-07-19 MED ORDER — ACETAMINOPHEN 325 MG PO TABS
650.0000 mg | ORAL_TABLET | Freq: Four times a day (QID) | ORAL | Status: DC | PRN
  Administered 2019-07-19 – 2019-07-20 (×2): 650 mg via ORAL
  Filled 2019-07-19 (×2): qty 2

## 2019-07-19 MED ORDER — ONDANSETRON HCL 4 MG PO TABS: 4.0000 mg | ORAL_TABLET | Freq: Four times a day (QID) | ORAL | Status: DC | PRN

## 2019-07-19 MED ORDER — LACTATED RINGERS IV BOLUS (SEPSIS)
500.0000 mL | Freq: Once | INTRAVENOUS | Status: AC
  Administered 2019-07-19: 500 mL via INTRAVENOUS

## 2019-07-19 MED ORDER — FLUTICASONE FUROATE-VILANTEROL 200-25 MCG/INH IN AEPB
1.0000 | INHALATION_SPRAY | Freq: Every day | RESPIRATORY_TRACT | Status: DC
  Administered 2019-07-20 – 2019-07-23 (×3): 1 via RESPIRATORY_TRACT
  Filled 2019-07-19: qty 28

## 2019-07-19 MED ORDER — SODIUM CHLORIDE 0.9 % IV SOLN
2.0000 g | Freq: Once | INTRAVENOUS | Status: AC
  Administered 2019-07-19: 2 g via INTRAVENOUS
  Filled 2019-07-19: qty 20

## 2019-07-19 MED ORDER — ACETAMINOPHEN 650 MG RE SUPP: 650.0000 mg | Freq: Four times a day (QID) | RECTAL | Status: DC | PRN

## 2019-07-19 MED ORDER — ONDANSETRON HCL 4 MG/2ML IJ SOLN: 4.0000 mg | Freq: Four times a day (QID) | INTRAMUSCULAR | Status: DC | PRN

## 2019-07-19 MED ORDER — IOHEXOL 300 MG/ML  SOLN
80.0000 mL | Freq: Once | INTRAMUSCULAR | Status: AC | PRN
  Administered 2019-07-19: 80 mL via INTRAVENOUS

## 2019-07-19 MED ORDER — LEVOTHYROXINE SODIUM 88 MCG PO TABS
88.0000 ug | ORAL_TABLET | Freq: Every day | ORAL | Status: DC
  Administered 2019-07-19 – 2019-07-23 (×4): 88 ug via ORAL
  Filled 2019-07-19 (×5): qty 1

## 2019-07-19 MED ORDER — METRONIDAZOLE IN NACL 5-0.79 MG/ML-% IV SOLN
500.0000 mg | Freq: Once | INTRAVENOUS | Status: AC
  Administered 2019-07-19: 500 mg via INTRAVENOUS
  Filled 2019-07-19: qty 100

## 2019-07-19 NOTE — Progress Notes (Signed)
Spoke with charge nurse, patient has a Research officer, political party PPM device that should be interrogated in the ED. Dr. Gardiner Rhyme plan to see the patient this afternoon.

## 2019-07-19 NOTE — Progress Notes (Addendum)
Cardiology Consultation:   Patient ID: Beverly Kim MRN: [id number]; DOB: [birth year]  Admit date: 07/19/2019 Date of Consult: 07/19/2019  Primary Care Provider: [name], MD Primary Cardiologist: No primary care provider on file.  Primary Electrophysiologist:  None    Patient Profile:   Beverly Kim is a 86 y.o. female with a hx of atrial fibrillation on xarelto, complete heart block status post pacemaker, nonobstructive CAD  (50-70% LAD stenosis on cath in 2003), chronic diastolic heart failure, left bundle branch block, hypothyroidism, hypertension, hyperlipidemia who is being seen today for the evaluation of pre-operative evaluation at the request of Dr Cruzita Lederer.  History of Present Illness:   Beverly Kim is a 86 year old female with history of atrial fibrillation on xarelto, complete heart block status post pacemaker, nonobstructive CAD  (50-70% LAD stenosis on cath in 2003), chronic diastolic heart failure, left bundle branch block, hypothyroidism, hypertension, hyperlipidemia who presents with acute cholecystiits.  She has a history of biliary colic and presented to ED with acute abdominal pain.  In the ED, was febrile to 101, BP 133/66, HR 66, SpO2 97% on RA.  Labs notable for Cr 1.26 (b/l 1.1), WBC 8.3, Hgb 12.6, BNP 467, lactate 3.0.  CT abd/pelvis concerning for acute cholecystitis.  She was started on broad spectrum abx.  RUQ [place] consistent with acute cholecystitis.  HIDA shows no gallbladder or biliary activity, concerning for CBD obstruction.  Surgery and GI  Consulted.  Last dose of Xarelto was yesterday.    She reports that she lives by herself, though often stays with her daughter as well.  She does all her own housework.  States that most exertion she does is carrying her laundry.  Denies any chest pain but does report she gets short of breath.  No stairs in her home, she is not sure if she could walk up a flight of stairs without stopping.  LVEF has historically been  in the 45 to 50% range but improved on most recent TTE 06/27/2016 showed LVEF 60 to 65%.  Required DCCV for AF 01/16/19.  Found to be in AF on follow-up in AF clinic.  Patient did not wish to pursue another DCCV or rhythm control, rate control was recommended.  She is currently on Xarelto 50 mg daily and bisoprolol 5 mg daily.  Past Medical History:  Diagnosis Date  . Atrial fibrillation ([place]) 08/16/2016   observed on PPM interrogation, asymptomatic, chad2vasc score is 4.  will consider anticoagulation  . CHF (congestive heart failure) ([place])   . Colon polyps   . Coronary artery disease   . Diverticulosis   . Dyspnea   . GERD (gastroesophageal reflux disease)   . Hearing loss of both ears   . Hepatic cyst   . Hiatal hernia   . Hyperlipidemia   . Hypertension   . Hypothyroidism   . Low back pain   . Mitral valve disorders(424.0)   . Other left bundle branch block   . Palpitations   . URI (upper respiratory infection)     Past Surgical History:  Procedure Laterality Date  . CARDIAC CATHETERIZATION  04-18-01  . CARDIOVERSION N/A 01/16/2019   Procedure: CARDIOVERSION;  Surgeon: Josue Hector, MD;  Location: [facility] ENDOSCOPY;  Service: Cardiovascular;  Laterality: N/A;  . COLONOSCOPY    . HERNIA REPAIR    . PACEMAKER IMPLANT Left 05/15/2016   SJM Assirity MRI dual chamber PPM implanted by Dr Rayann Heman for CHB  . THYROIDECTOMY    . TONSILLECTOMY  AND ADENOIDECTOMY    . TOTAL ABDOMINAL HYSTERECTOMY W/ BILATERAL SALPINGOOPHORECTOMY        Inpatient Medications: Scheduled Meds: . bisoprolol  5 mg Oral Daily  . fluticasone furoate-vilanterol  1 puff Inhalation Daily  . levothyroxine  88 mcg Oral QAC breakfast  . umeclidinium bromide  1 puff Inhalation Daily   Continuous Infusions: . sodium chloride 100 mL/hr at 07/19/19 1739  . [START ON 07/20/2019] cefTRIAXone (ROCEPHIN)  IV    . metronidazole 500 mg (07/19/19 1458)   PRN Meds: acetaminophen **OR** acetaminophen, albuterol, morphine  injection, ondansetron **OR** ondansetron (ZOFRAN) IV  Allergies:    Allergies  Allergen Reactions  . Adhesive [Tape] Itching, Dermatitis, Rash and Other (See Comments)    Blisters and "skin bubbles"  . Ace Inhibitors Cough  . Atorvastatin Other (See Comments)    REACTION: Reaction not known  . Clindamycin Other (See Comments)    Unknown  . Codeine Other (See Comments)    hallucinations   . Latex Other (See Comments)    blisters  . Shellfish Allergy Other (See Comments)    "gallbladder attack"  . Simvastatin Other (See Comments)    REACTION: fatigue  . Penicillins Rash    Has patient had a PCN reaction causing immediate rash, facial/tongue/throat swelling, SOB or lightheadedness with hypotension: Unknown Has patient had a PCN reaction causing severe rash involving mucus membranes or skin necrosis: Unknown Has patient had a PCN reaction that required hospitalization: Unknown Has patient had a PCN reaction occurring within the last 10 years: No If all of the above answers are "NO", then may proceed with Cephalosporin use.     Social History:   Social History   Socioeconomic History  . Marital status: Widowed    Spouse name: Not on file  . Number of children: 4  . Years of education: Not on file  . Highest education level: Not on file  Occupational History  . Occupation: retired  Tobacco Use  . Smoking status: Never Smoker  . Smokeless tobacco: Never Used  Substance and Sexual Activity  . Alcohol use: No  . Drug use: No  . Sexual activity: Never  Other Topics Concern  . Not on file  Social History Narrative  . Not on file   Social Determinants of Health   Financial Resource Strain:   . Difficulty of Paying Living Expenses:   Food Insecurity:   . Worried About Charity fundraiser in the Last Year:   . Arboriculturist in the Last Year:   Transportation Needs:   . Film/video editor (Medical):   Marland Kitchen Lack of Transportation (Non-Medical):   Physical Activity:  Sufficiently Active  . Days of Exercise per Week: 5 days  . Minutes of Exercise per Session: 40 min  Stress:   . Feeling of Stress :   Social Connections:   . Frequency of Communication with Friends and Family:   . Frequency of Social Gatherings with Friends and Family:   . Attends Religious Services:   . Active Member of Clubs or Organizations:   . Attends Archivist Meetings:   Marland Kitchen Marital Status:   Intimate Partner Violence:   . Fear of Current or Ex-Partner:   . Emotionally Abused:   Marland Kitchen Physically Abused:   . Sexually Abused:     Family History:    Family History  Problem Relation Age of Onset  . Coronary artery disease Other        family hx  of 1st degree relative <50  . Diabetes Other        family hx of  . Other Other        cardiovascular disorder family hx of  . Other Other        neurological disorder family hx of  . Other Other        respiratory disease family hx of  . Heart attack Daughter   . Heart Problems Other        all children  . Sudden death Son   . Heart disease Brother   . Heart disease Sister   . Colon cancer Neg Hx   . Colon polyps Neg Hx      ROS:  Please see the history of present illness.  All other ROS reviewed and negative.     Physical Exam/Data:   Vitals:   07/19/19 1359 07/19/19 1500 07/19/19 1600 07/19/19 1659  BP: 131/74 (!) 131/40 (!) 115/53 115/63  Pulse: 70 78 70 69  Resp: 15 18 (!) 24 20  Temp:    98.2 F (36.8 C)  TempSrc:    Oral  SpO2: 96% 97% 96% 95%  Weight:    75.8 kg  Height:    5\' 2"  (1.575 m)    Intake/Output Summary (Last 24 hours) at 07/19/2019 1836 Last data filed at 07/19/2019 1739 Gross per 24 hour  Intake 385.89 ml  Output --  Net 385.89 ml   Last 3 Weights 07/19/2019 07/19/2019 06/19/2019  Weight (lbs) 167 lb 166 lb 169 lb 3.2 oz  Weight (kg) 75.751 kg 75.297 kg 76.749 kg     Body mass index is 30.54 kg/m.  General:  in no acute distress HEENT: normal Neck: no JVD appreciated Cardiac:   RRR; no murmur  Lungs:  clear to auscultation bilaterally, no wheezing, rhonchi or rales  Abd: TTP in RUQ Ext: no edema Musculoskeletal:  No deformities Skin: warm and dry  Neuro:  no focal abnormalities noted Psych:  Normal affect   EKG:  The EKG was personally reviewed and demonstrates:  V-paced, rate 70 Telemetry:  Telemetry was personally reviewed and demonstrates:  Vpaced rhythm with rate 70s.  7 beat run of NSVT  Relevant CV Studies: TTE 06/27/16: - Left ventricle: The cavity size was normal. There was moderate  focal basal hypertrophy of the septum. Systolic function was  normal. The estimated ejection fraction was in the range of 60%  to 65%. Wall motion was normal; there were no regional wall  motion abnormalities. Doppler parameters are consistent with  abnormal left ventricular relaxation (grade 1 diastolic  dysfunction).  - Aortic valve: Trileaflet; mildly thickened, mildly calcified  leaflets. There was trivial regurgitation.  - Mitral valve: There was mild regurgitation.  - Left atrium: Volume/bsa, S: 32.7 ml/m^2.  - Tricuspid valve: There was mild regurgitation.   Laboratory Data:  High Sensitivity Troponin:  No results for input(s): TROPONINIHS in the last 720 hours.   Chemistry Recent Labs  Lab 07/19/19 0139 07/19/19 0635  NA 133* 138  K 3.7 3.1*  CL 94* 104  CO2 28 23  GLUCOSE 158* 159*  BUN 31* 25*  CREATININE 1.26* 0.97  CALCIUM 9.0 7.6*  GFRNONAA 37* 51*  GFRAA 43* 59*  ANIONGAP 11 11    Recent Labs  Lab 07/19/19 0139 07/19/19 0635 07/19/19 1533  PROT 7.1 5.4* 5.7*  ALBUMIN 4.0 3.0* 3.1*  AST 138* 226* 226*  ALT 79* 167* 166*  ALKPHOS 90 84 87  BILITOT 1.5* 2.5* 2.5*   Hematology Recent Labs  Lab 07/19/19 0139 07/19/19 0635  WBC 8.3 10.5  RBC 3.97 3.64*  HGB 12.6 11.3*  HCT 38.7 35.3*  MCV 97.5 97.0  MCH 31.7 31.0  MCHC 32.6 32.0  RDW 13.7 14.0  PLT 174 156   BNP Recent Labs  Lab 07/19/19 0638  BNP 467.4*     DDimer No results for input(s): DDIMER in the last 168 hours.   Radiology/Studies:  NM Hepatobiliary Liver Func  Result Date: 07/19/2019 CLINICAL DATA:  Evaluate for cholecystitis. Right upper quadrant pain. EXAM: NUCLEAR MEDICINE HEPATOBILIARY IMAGING TECHNIQUE: Sequential images of the abdomen were obtained out to 60 minutes following intravenous administration of radiopharmaceutical. RADIOPHARMACEUTICALS:  5.3 mCi Tc-33m  Choletec IV COMPARISON:  CT of the abdomen dated 07/19/2019 and abdominal sonogram from 07/19/2019. FINDINGS: Following the IV administration of the radiopharmaceutical there is prompt radiotracer uptake within the liver. No gallbladder, biliary activity, or small bowel activity identified after 120 minutes of imaging. Review of the CT from 07/19/2019 shows evidence of biliary obstruction with a dilated common bile duct measuring 1 cm which abruptly terminates at the level of the head of pancreas. This may be due to common bile duct stones or underlying mass. IMPRESSION: 1. Imaging findings are compatible with common bile duct obstruction which correlates to the findings from CT dated 07/19/2019. This may be due to common bile duct stones or mass. As the patient has a pacemaker and may not qualify for MRCP recommend gastrointestinal consultation for possible ERCP. Electronically Signed   By: Kerby Moors M.D.   On: 07/19/2019 14:17   CT ABDOMEN PELVIS W CONTRAST  Result Date: 07/19/2019 CLINICAL DATA:  Generalized abdominal pain with emesis EXAM: CT ABDOMEN AND PELVIS WITH CONTRAST TECHNIQUE: Multidetector CT imaging of the abdomen and pelvis was performed using the standard protocol following bolus administration of intravenous contrast. CONTRAST:  14mL OMNIPAQUE IOHEXOL 300 MG/ML  SOLN COMPARISON:  CT 07/25/2017 FINDINGS: Lower chest: Lung bases demonstrate no acute consolidation or pleural effusion. Cardiomegaly with incompletely visualized cardiac pacing leads.  Hepatobiliary: Heterogenous enhancement of the liver. No calcified gallstone. Gallbladder wall thickening versus pericholecystic fluid. No biliary dilatation Pancreas: Unremarkable. No pancreatic ductal dilatation or surrounding inflammatory changes. Spleen: Normal in size without focal abnormality. Adrenals/Urinary Tract: Adrenal glands are unremarkable. Kidneys are normal, without renal calculi, focal lesion, or hydronephrosis. Bladder is unremarkable. Stomach/Bowel: Stomach is within normal limits. Sigmoid colon diverticula without acute inflammatory process. No evidence of bowel wall thickening, distention, or inflammatory changes. Vascular/Lymphatic: Moderate aortic atherosclerosis. No aneurysm. No suspicious adenopathy Reproductive: Status post hysterectomy. No adnexal masses. Other: Negative for free air or free fluid Musculoskeletal: No acute or suspicious osseous abnormality. 9 mm anterolisthesis L5 on S1 with degenerative changes. IMPRESSION: 1. Gallbladder wall thickening versus pericholecystic fluid though without calcified gallstone by CT. Suggest correlation with ultrasound 2. Heterogenous enhancement of the liver, probably due to heterogeneous fat infiltration, though infiltrative mass could also be considered. 3. Sigmoid colon diverticula without acute inflammatory process Electronically Signed   By: Donavan Foil M.D.   On: 07/19/2019 03:51   US Abdomen Limited RUQ  Result Date: 07/19/2019 CLINICAL DATA:  Pain EXAM: ULTRASOUND ABDOMEN LIMITED RIGHT UPPER QUADRANT COMPARISON:  CT abdomen dated 07/19/2019. FINDINGS: Gallbladder: Pericholecystic fluid is present. There appears to be irregular thickening of the walls of the gallbladder fundus. Hyperechoic structure is seen along the nondependent gallbladder wall, presumed calcification. Sonographic Murphy's sign was elicited during the exam.  Common bile duct: Diameter: 12 mm Liver: No focal lesion identified. Within normal limits in parenchymal  echogenicity. Portal vein is patent on color Doppler imaging with normal direction of blood flow towards the liver. Other: None. IMPRESSION: 1. Findings highly suspicious for acute cholecystitis, with irregular thickening of the walls of the gallbladder fundus and pericholecystic fluid. Echogenic structure along the nondependent gallbladder wall, presumably a partially calcified wall (porcelain gallbladder). 2. Prominently distended common bile duct, measuring 12 mm diameter. Recommend correlation with liver function tests. If abnormal, would certainly consider further characterization with ERCP or MRCP. Electronically Signed   By: Franki Cabot M.D.   On: 07/19/2019 05:17   {  Assessment and Plan:   Pre-op evaluation: prior to cholecystectomy.  She is quite functional for her age, though functional capacity appears <4 METs.  RCRI score 2-3 (CHF, intraperitoneal surgery +/- CAD, reportedly nonobstructive on last cath in 2003), which corresponds to 10-15% 30 day cardiac risk of MI, cardiac arrest, or death.  Other risk factors not included in RCRI score include advanced age.  Overall would classify as high risk for an intermediate risk procedure.  Discussed risks with patient and her daughter, and they recognize that she is high risk but would like to proceed with procedure.  Denies any exertional chest pain.  Does not appear volume overloaded on exam.  No further cardiac tests recommended prior to procedure.  Atrial fibrillation: CHADS-VASc score 6 (CHF, HTN, age x2, CAD, female).  On xarelto and bisoprolol at home. - Holding xarelto for procedure - Start heparin gtt once able to post-operatively - Continue bisoprolol for rate control  Chronic diastolic heart failure: on lasix 40 mg daily and metolazone 2.5 mg three times per week at home.  Mild AKI on admission, resolved with IV fluids.  Does not appear volume overloaded on exam, though BNP is elevated.  Would hold off on further IV fluids.  Will  follow, will likely need diuresis post-operatively  NSVT: 7 beat run on telemetry.  K 3.1, would replete to maintain K>4, Mag>2  For questions or updates, please contact [place] Please consult [email or web address] for contact info under     Signed, Donato Heinz, MD  07/19/2019 6:36 PM

## 2019-07-19 NOTE — ED Provider Notes (Signed)
Emergency Department Provider Note  I have reviewed the triage vital signs and the nursing notes.  HISTORY  Chief Complaint Abdominal Pain   HPI Beverly Kim is a 86 y.o. female multiple medical problems as documented below to include A. fib on Xarelto, CHF, coronary disease, hypertension who presents the emerge department today with abdominal pain.  At time of evaluation patient states she feels well she stated earlier in the day she is having some abdominal pain however.  She states that she had 2 bowel movements that were harder than normal but it started having severe upper abdominal pain called EMS EMS arrival shortly afterwards she started nonbloody nonbilious emesis after which her pain was significantly improved to the point that when she gets here she is asymptomatic.  She has not nauseous or having abdominal pain.  No fevers recently.  No other associated symptoms.   No other associated or modifying symptoms.    Past Medical History:  Diagnosis Date  . Atrial fibrillation ([place]) 08/16/2016   observed on PPM interrogation, asymptomatic, chad2vasc score is 4.  will consider anticoagulation  . CHF (congestive heart failure) ([place])   . Colon polyps   . Coronary artery disease   . Diverticulosis   . Dyspnea   . GERD (gastroesophageal reflux disease)   . Hearing loss of both ears   . Hepatic cyst   . Hiatal hernia   . Hyperlipidemia   . Hypertension   . Hypothyroidism   . Low back pain   . Mitral valve disorders(424.0)   . Other left bundle branch block   . Palpitations   . URI (upper respiratory infection)     Patient Active Problem List   Diagnosis Date Noted  . Persistent atrial fibrillation ([place]) 01/23/2019  . Secondary hypercoagulable state ([place]) 01/23/2019  . Fatigue 11/25/2018  . Poor balance 10/22/2018  . Calculus of gallbladder without cholecystitis without obstruction 07/26/2017  . Pyelonephritis 07/25/2017  . Carotid disease, bilateral ([place])  04/08/2017  . Leg pain 04/08/2017  . Non-rheumatic mitral regurgitation 07/12/2016  . Complete heart block ([place]) 05/14/2016  . Diabetes mellitus without complication ([place]) [id number]  . Degenerative disc disease, cervical 08/23/2015  . Degenerative disc disease, lumbar 08/23/2015  . Left rotator cuff tear arthropathy 07/12/2015  . Left shoulder pain 04/06/2015  . Frozen shoulder 01/03/2015  . Cough variant asthma 12/08/2013  . Upper airway cough syndrome 10/27/2013  . Dyspnea 02/03/2013  . GERD 07/06/2009  . HIATAL HERNIA 07/06/2009  . DIVERTICULOSIS, COLON 07/06/2009  . HEPATIC CYST 07/06/2009  . COLONIC POLYPS, HX OF 07/06/2009  . MIXED HEARING LOSS BILATERAL 12/13/2008  . Essential hypertension 06/19/2008  . MITRAL VALVE PROLAPSE 06/19/2008  . LBBB (left bundle branch block) 06/19/2008  . Hyperlipidemia 08/12/2007  . Hypothyroidism 12/02/2006  . CAD (coronary artery disease) 12/02/2006  . Acute on chronic diastolic heart failure ([place]) 12/02/2006    Past Surgical History:  Procedure Laterality Date  . CARDIAC CATHETERIZATION  04-18-01  . CARDIOVERSION N/A 01/16/2019   Procedure: CARDIOVERSION;  Surgeon: Josue Hector, MD;  Location: [facility] ENDOSCOPY;  Service: Cardiovascular;  Laterality: N/A;  . COLONOSCOPY    . HERNIA REPAIR    . PACEMAKER IMPLANT Left 05/15/2016   SJM Assirity MRI dual chamber PPM implanted by Dr Rayann Heman for CHB  . THYROIDECTOMY    . TONSILLECTOMY AND ADENOIDECTOMY    . TOTAL ABDOMINAL HYSTERECTOMY W/ BILATERAL SALPINGOOPHORECTOMY      Current Outpatient Rx  . Order #: [id number] Class:  Historical Med  . Order #: [id number] Class: Normal  . Order #: [id number] Class: Historical Med  . Order #: [id number] Class: Historical Med  . Order #: [id number] Class: Historical Med  . Order #: [id number] Class: Normal  . Order #: [id number] Class: Normal  . Order #: [id number] Class: Historical Med  . Order #: [id number] Class: Normal  . Order #: [id number] Class: Normal  .  Order #: [id number] Class: Historical Med  . Order #: [id number] Class: Normal  . Order #: [id number] Class: Print  . Order #: [id number] Class: Normal    Allergies Adhesive [tape], Ace inhibitors, Atorvastatin, Clindamycin, Codeine, Latex, Shellfish allergy, Simvastatin, and Penicillins  Family History  Problem Relation Age of Onset  . Coronary artery disease Other        family hx of 1st degree relative <50  . Diabetes Other        family hx of  . Other Other        cardiovascular disorder family hx of  . Other Other        neurological disorder family hx of  . Other Other        respiratory disease family hx of  . Heart attack Daughter   . Heart Problems Other        all children  . Sudden death Son   . Heart disease Brother   . Heart disease Sister   . Colon cancer Neg Hx   . Colon polyps Neg Hx     Social History Social History   Tobacco Use  . Smoking status: Never Smoker  . Smokeless tobacco: Never Used  Substance Use Topics  . Alcohol use: No  . Drug use: No    Review of Systems  All other systems negative except as documented in the HPI. All pertinent positives and negatives as reviewed in the HPI. ____________________________________________  PHYSICAL EXAM:  VITAL SIGNS: ED Triage Vitals  Enc Vitals Group     BP 07/19/19 0120 133/66     Pulse Rate 07/19/19 0120 66     Resp --      Temp 07/19/19 0120 97.8 F (36.6 C)     Temp Source 07/19/19 0120 Oral     SpO2 07/19/19 0120 97 %     Weight 07/19/19 0120 166 lb (75.3 kg)     Height 07/19/19 0120 5' 2.5" (1.588 m)    Constitutional: Alert and oriented. Well appearing and in no acute distress. Eyes: Conjunctivae are normal. PERRL. EOMI. Head: Atraumatic. Nose: No congestion/rhinnorhea. Mouth/Throat: Mucous membranes are moist.  Oropharynx non-erythematous. Neck: No stridor.  No meningeal signs.   Cardiovascular: Normal rate, regular rhythm. Good peripheral circulation. Grossly normal heart sounds.     Respiratory: Normal respiratory effort.  No retractions. Lungs CTAB. Gastrointestinal: Soft and nontender. No distention.  Musculoskeletal: No lower extremity tenderness nor edema. No gross deformities of extremities. Neurologic:  Normal speech and language. No gross focal neurologic deficits are appreciated.  Skin:  Skin is warm, dry and intact. No rash noted.  ____________________________________________   LABS (all labs ordered are listed, but only abnormal results are displayed)  Labs Reviewed  COMPREHENSIVE METABOLIC PANEL - Abnormal; Notable for the following components:      Result Value   Sodium 133 (*)    Chloride 94 (*)    Glucose, Bld 158 (*)    BUN 31 (*)    Creatinine, Ser 1.26 (*)    AST 138 (*)    ALT 79 (*)    Total Bilirubin 1.5 (*)  GFR calc non Af Amer 37 (*)    GFR calc Af Amer 43 (*)    All other components within normal limits  URINALYSIS, ROUTINE W REFLEX MICROSCOPIC - Abnormal; Notable for the following components:   APPearance HAZY (*)    Protein, ur 100 (*)    Leukocytes,Ua MODERATE (*)    WBC, UA >50 (*)    Bacteria, UA RARE (*)    All other components within normal limits  LACTIC ACID, PLASMA - Abnormal; Notable for the following components:   Lactic Acid, Venous 4.7 (*)    All other components within normal limits  LACTIC ACID, PLASMA - Abnormal; Notable for the following components:   Lactic Acid, Venous 4.0 (*)    All other components within normal limits  PROTIME-INR - Abnormal; Notable for the following components:   Prothrombin Time 18.7 (*)    INR 1.6 (*)    All other components within normal limits  CULTURE, BLOOD (ROUTINE X 2)  CULTURE, BLOOD (ROUTINE X 2)  URINE CULTURE  CBC WITH DIFFERENTIAL/PLATELET  LIPASE, BLOOD  APTT  LACTIC ACID, PLASMA   ____________________________________________  EKG   EKG Interpretation  Date/Time:  Sunday Jul 19 2019 01:30:58 EDT Ventricular Rate:  70 PR Interval:    QRS Duration: 170 QT  Interval:  462 QTC Calculation: 499 R Axis:   -80 Text Interpretation: paced rhythm with expected associated conduction delay and repol abnormalities Confirmed by Merrily Pew [phone]) on 07/19/2019 4:26:08 AM       ____________________________________________  RADIOLOGY  CT ABDOMEN PELVIS W CONTRAST  Result Date: 07/19/2019 CLINICAL DATA:  Generalized abdominal pain with emesis EXAM: CT ABDOMEN AND PELVIS WITH CONTRAST TECHNIQUE: Multidetector CT imaging of the abdomen and pelvis was performed using the standard protocol following bolus administration of intravenous contrast. CONTRAST:  55mL OMNIPAQUE IOHEXOL 300 MG/ML  SOLN COMPARISON:  CT 07/25/2017 FINDINGS: Lower chest: Lung bases demonstrate no acute consolidation or pleural effusion. Cardiomegaly with incompletely visualized cardiac pacing leads. Hepatobiliary: Heterogenous enhancement of the liver. No calcified gallstone. Gallbladder wall thickening versus pericholecystic fluid. No biliary dilatation Pancreas: Unremarkable. No pancreatic ductal dilatation or surrounding inflammatory changes. Spleen: Normal in size without focal abnormality. Adrenals/Urinary Tract: Adrenal glands are unremarkable. Kidneys are normal, without renal calculi, focal lesion, or hydronephrosis. Bladder is unremarkable. Stomach/Bowel: Stomach is within normal limits. Sigmoid colon diverticula without acute inflammatory process. No evidence of bowel wall thickening, distention, or inflammatory changes. Vascular/Lymphatic: Moderate aortic atherosclerosis. No aneurysm. No suspicious adenopathy Reproductive: Status post hysterectomy. No adnexal masses. Other: Negative for free air or free fluid Musculoskeletal: No acute or suspicious osseous abnormality. 9 mm anterolisthesis L5 on S1 with degenerative changes. IMPRESSION: 1. Gallbladder wall thickening versus pericholecystic fluid though without calcified gallstone by CT. Suggest correlation with ultrasound 2. Heterogenous  enhancement of the liver, probably due to heterogeneous fat infiltration, though infiltrative mass could also be considered. 3. Sigmoid colon diverticula without acute inflammatory process Electronically Signed   By: Donavan Foil M.D.   On: 07/19/2019 03:51   US Abdomen Limited RUQ  Result Date: 07/19/2019 CLINICAL DATA:  Pain EXAM: ULTRASOUND ABDOMEN LIMITED RIGHT UPPER QUADRANT COMPARISON:  CT abdomen dated 07/19/2019. FINDINGS: Gallbladder: Pericholecystic fluid is present. There appears to be irregular thickening of the walls of the gallbladder fundus. Hyperechoic structure is seen along the nondependent gallbladder wall, presumed calcification. Sonographic Murphy's sign was elicited during the exam. Common bile duct: Diameter: 12 mm Liver: No focal lesion identified. Within normal limits in parenchymal  echogenicity. Portal vein is patent on color Doppler imaging with normal direction of blood flow towards the liver. Other: None. IMPRESSION: 1. Findings highly suspicious for acute cholecystitis, with irregular thickening of the walls of the gallbladder fundus and pericholecystic fluid. Echogenic structure along the nondependent gallbladder wall, presumably a partially calcified wall (porcelain gallbladder). 2. Prominently distended common bile duct, measuring 12 mm diameter. Recommend correlation with liver function tests. If abnormal, would certainly consider further characterization with ERCP or MRCP. Electronically Signed   By: Franki Cabot M.D.   On: 07/19/2019 05:17   ____________________________________________  PROCEDURES  Procedure(s) performed:   .Critical Care Performed by: Merrily Pew, MD Authorized by: Merrily Pew, MD   Critical care provider statement:    Critical care time (minutes):  45   Critical care was necessary to treat or prevent imminent or life-threatening deterioration of the following conditions:  Sepsis   Critical care was time spent personally by me on the  following activities:  Discussions with consultants, evaluation of patient's response to treatment, examination of patient, ordering and performing treatments and interventions, ordering and review of laboratory studies, ordering and review of radiographic studies, pulse oximetry, re-evaluation of patient's condition, obtaining history from patient or surrogate and review of old charts   ____________________________________________  INITIAL IMPRESSION / [place] / ED COURSE   This patient presents to the ED for concern of vomiting and abdominal pain, this involves an extensive number of treatment options, and is a complaint that carries with it a high risk of complications and morbidity.  The differential diagnosis includes gastroenterititis, cholecystitis, pancreatitis, colitis, bowel obstruction      Lab Tests:  I Ordered, reviewed, and interpreted labs, which included CBC that was unremarkable, CMP which showed a mildly elevated AST, ALT and bilirubin along with a mild AKI.  Does have mildly low sodium and chloride but not significantly so.  Her lipase is normal.  Urinalysis showed rare bacteria but greater than 50 whites and leukocytes.  Could be an infection so her urine culture added on.     Medicines ordered:   I ordered medication Zofran for vomiting, Protonix in case this was related to some gastritis.    Imaging Studies ordered:  I independently visualized and interpreted imaging CT scan of her abdomen pelvis which showed thickened gallbladder wall with pericholecystic fluid no obvious cholelithiasis.    Additional history obtained:   Additional history obtained from her daughter  Previous records obtained and reviewed in epic  Consultations Obtained:   I consulted Dr. Lucia Gaskins with general surgery and states that she is not a surgical candidate because of his relative at this time.  He would recommend an ultrasound admission to medicine they will follow-up in  the morning and help come up with a more definitive plan.   Hospitalist paged, Dr. Marcello Moores, and discussed lab and imaging findings and will admit for further management.   Reevaluation: I was asked by nursing to come to the room because the patient had a change in status.  She was having chills and her color had changed.  She did appear more gray than previously and was having rigors.  Rectal temperature revealed that her temperature was 101.1.  This is when code sepsis was initiated. Lactic acid and come back elevated after code sepsis was initiated.  Her CT scan shows likely gallbladder wall thickening with peri-cholecystic fluid.  Antibiotics used for gallbladder and intra-abdominal infection are similar to the antibiotics used for her urinary tract  infection.  Blood cultures drawn.  After the interventions stated above, I reevaluated the patient and found improvement but still appears ill.   Critical Interventions: Code sepsis initiated Fluid boluses Appropriate antibiotics Surgical consultation Medical admission ____________________________________________  FINAL CLINICAL IMPRESSION(S) / ED DIAGNOSES  Final diagnoses:  Abdominal pain    MEDICATIONS GIVEN DURING THIS VISIT:  Medications  sodium chloride (PF) 0.9 % injection (has no administration in time range)  pantoprazole (PROTONIX) EC tablet 40 mg (40 mg Oral Given 07/19/19 0423)  iohexol (OMNIPAQUE) 300 MG/ML solution 80 mL (80 mLs Intravenous Contrast Given 07/19/19 0252)  lactated ringers bolus 1,000 mL (1,000 mLs Intravenous New Bag/Given 07/19/19 0422)    And  lactated ringers bolus 1,000 mL (1,000 mLs Intravenous New Bag/Given 07/19/19 0423)    And  lactated ringers bolus 500 mL (500 mLs Intravenous New Bag/Given 07/19/19 0423)  cefTRIAXone (ROCEPHIN) 2 g in sodium chloride 0.9 % 100 mL IVPB (0 g Intravenous Stopped 07/19/19 0507)  metroNIDAZOLE (FLAGYL) IVPB 500 mg (500 mg Intravenous New Bag/Given 07/19/19 0432)    ondansetron (ZOFRAN) injection 4 mg (4 mg Intravenous Given 07/19/19 0423)  fentaNYL (SUBLIMAZE) injection 50 mcg (50 mcg Intravenous Given 07/19/19 0526)    NEW OUTPATIENT MEDICATIONS STARTED DURING THIS VISIT:  New Prescriptions   No medications on file    Note:  This note was prepared with assistance of Dragon voice recognition software. Occasional wrong-word or sound-a-like substitutions may have occurred due to the inherent limitations of voice recognition software.   Adryanna Friedt, Corene Cornea, MD 07/19/19 [phone]

## 2019-07-19 NOTE — ED Notes (Signed)
Pt going to nuclear medicine.

## 2019-07-19 NOTE — Progress Notes (Signed)
No charge note  Patient seen and examined this morning, admitted earlier, H&P reviewed, agree with the assessment and plan.  In brief, this is a 86 year old female with history of A. fib on Xarelto, presence of pacemaker for AV block, CAD, chronic diastolic CHF, hypothyroidism, hypertension, hyperlipidemia, cholelithiasis and biliary colic came to the hospital with acute onset abdominal pain.  She has had an episode like this before and was told that she needs a cholecystectomy however per patient did not want to do that at that time.  CT scan showed gallbladder wall thickening and an ultrasound was highly suspicious for acute cholecystitis with a prominently distended CBD measuring 12 mm in diameter.  She had mild LFT elevation AST, ALT are 138, 79 as well as an elevated bilirubin of 1.5.  Lipase was normal.  She feels well on my evaluation, no nausea or vomiting.  Asking about getting some water.  BP (!) 134/55   Pulse 72   Temp (!) 101.1 F (38.4 C) (Rectal)   Resp (!) 24   Ht 5' 2.5" (1.588 m)   Wt 75.3 kg   SpO2 96%   BMI 29.88 kg/m   Constitutional: NAD Respiratory: CTA Cardiovascular: Regular rate and rhythm. Trace LE edema Abdomen: Epigastric tenderness present without guarding or rebound   Acute cholecystitis with sepsis, POA-patient febrile in the ED, tachypneic, concerning for infectious source with her gallbladder.  General surgery consulted, will see patient this morning, keep n.p.o.  UTI -mild UA abnormalities, continue antibiotics for above, await cultures  Creatinine elevation-does not meet criteria for AKI  Hyponatremia-monitor  Afib on Xarelto, complete AV block status post pacemaker-hold xarelto currently due to plan for possible surgery  -Continue bisoprolol 5 mg daily  HTN -stable   Chronic diastolic dysfunction  -no noted exacerbation   CAD -no active issues   Hypothyroidism  -resume synthroid  HLD -resume home medications once tolerating  po   Scheduled Meds: . bisoprolol  5 mg Oral Daily  . Fluticasone-Umeclidin-Vilant  1 puff Inhalation Daily  . levothyroxine  88 mcg Oral QAC breakfast  . sodium chloride (PF)       Continuous Infusions: . sodium chloride    . [START ON 07/20/2019] cefTRIAXone (ROCEPHIN)  IV    . metronidazole     PRN Meds:.acetaminophen **OR** acetaminophen, albuterol, morphine injection, ondansetron **OR** ondansetron (ZOFRAN) IV   Tallyn Holroyd M. Cruzita Lederer, MD, PhD [facility]  Between 7 am - 7 pm I am available, please contact me via Amion or Securechat Between 7 pm - 7 am I am not available, please contact night coverage MD/APP via Amion

## 2019-07-19 NOTE — H&P (Addendum)
History and Physical    DUBLIN HIGHSMITH [id number] DOB: 05-17-1924 DOA: 07/19/2019  PCP: [name], MD  Patient coming from: home I have personally briefly reviewed patient's old medical records in [place]  Chief Complaint: abdominal pain   HPI: Beverly Kim is a 86 y.o. female with medical history significant of  Presents to ed A. fib on Xarelto, CHF, coronary disease,Hypothyroidism,HLD hypertension,GERD, history of cholelithiasis and biliary colic who presents to ed with acute onset of abdominal pain. Of note patient is severely HOH and history is difficult to obtain. However per daughter , patient had acute event of abdominal pain to night. Per daughter patient is know to have history of biliary colic and was told in the past that she would most likely need a cholecystectomy. Per daughter patient since her diagnosis has and intermittent flare of biliary colic but had never required hospitalization until now.  ED Course:  Vitals: temp rectal 101, bp 133/66, hr 66, sat 97% on ra  Ct [id number] for possible acute cholecystitis / RUQ recommended  Labs: Na 133, cr 1.26 base 1.1, ast 139, alt 79, tb 1.5 Wbc:8.3, hgb 12.6  No nausea/diarrhea noted in ed  Surgery consulted who recommended iv abx and further imaging as hospitalist admission  Review of Systems: Unable to obtain due to patient  Being very [facility]  Past Medical History:  Diagnosis Date  . Atrial fibrillation ([place]) 08/16/2016   observed on PPM interrogation, asymptomatic, chad2vasc score is 4.  will consider anticoagulation  . CHF (congestive heart failure) ([place])   . Colon polyps   . Coronary artery disease   . Diverticulosis   . Dyspnea   . GERD (gastroesophageal reflux disease)   . Hearing loss of both ears   . Hepatic cyst   . Hiatal hernia   . Hyperlipidemia   . Hypertension   . Hypothyroidism   . Low back pain   . Mitral valve disorders(424.0)   . Other left bundle branch block   . Palpitations    . URI (upper respiratory infection)     Past Surgical History:  Procedure Laterality Date  . CARDIAC CATHETERIZATION  04-18-01  . CARDIOVERSION N/A 01/16/2019   Procedure: CARDIOVERSION;  Surgeon: Josue Hector, MD;  Location: [facility] ENDOSCOPY;  Service: Cardiovascular;  Laterality: N/A;  . COLONOSCOPY    . HERNIA REPAIR    . PACEMAKER IMPLANT Left 05/15/2016   SJM Assirity MRI dual chamber PPM implanted by Dr Rayann Heman for CHB  . THYROIDECTOMY    . TONSILLECTOMY AND ADENOIDECTOMY    . TOTAL ABDOMINAL HYSTERECTOMY W/ BILATERAL SALPINGOOPHORECTOMY       reports that she has never smoked. She has never used smokeless tobacco. She reports that she does not drink alcohol or use drugs.  Allergies  Allergen Reactions  . Adhesive [Tape] Itching, Dermatitis, Rash and Other (See Comments)    Blisters and "skin bubbles"  . Ace Inhibitors Cough  . Atorvastatin Other (See Comments)    REACTION: Reaction not known  . Clindamycin Other (See Comments)    Unknown  . Codeine Other (See Comments)    hallucinations   . Latex Other (See Comments)    blisters  . Shellfish Allergy Other (See Comments)    "gallbladder attack"  . Simvastatin Other (See Comments)    REACTION: fatigue  . Penicillins Rash    Has patient had a PCN reaction causing immediate rash, facial/tongue/throat swelling, SOB or lightheadedness with hypotension: Unknown Has patient had  a PCN reaction causing severe rash involving mucus membranes or skin necrosis: Unknown Has patient had a PCN reaction that required hospitalization: Unknown Has patient had a PCN reaction occurring within the last 10 years: No If all of the above answers are "NO", then may proceed with Cephalosporin use.     Family History  Problem Relation Age of Onset  . Coronary artery disease Other        family hx of 1st degree relative <50  . Diabetes Other        family hx of  . Other Other        cardiovascular disorder family hx of  . Other Other         neurological disorder family hx of  . Other Other        respiratory disease family hx of  . Heart attack Daughter   . Heart Problems Other        all children  . Sudden death Son   . Heart disease Brother   . Heart disease Sister   . Colon cancer Neg Hx   . Colon polyps Neg Hx    Prior to Admission medications   Medication Sig Start Date End Date Taking? Authorizing Provider  BIOTIN PO Take 1 tablet by mouth daily.   Yes [provider]  bisoprolol (ZEBETA) 5 MG tablet Take 1 tablet by mouth once daily Patient taking differently: Take 5 mg by mouth daily.  05/28/19  Yes Burns, Claudina Lick, MD  Calcium Carb-Cholecalciferol (CALCIUM/VITAMIN D PO) Take 1 tablet by mouth daily.    Yes [provider]  calcium carbonate (TUMS - DOSED IN MG ELEMENTAL CALCIUM) 500 MG chewable tablet Chew 1 tablet by mouth daily as needed for indigestion or heartburn.   Yes [provider]  cholecalciferol (VITAMIN D3) 25 MCG (1000 UNIT) tablet Take 1,000 Units by mouth daily.   Yes [provider]  furosemide (LASIX) 40 MG tablet Take 1 tablet (40 mg total) by mouth daily. 02/06/19  Yes Burns, Claudina Lick, MD  metolazone (ZAROXOLYN) 2.5 MG tablet TAKE ONE TABLET BY MOUTH THREE TIMES A WEEK Patient taking differently: Take 2.5 mg by mouth See admin instructions. TAKE ONE TABLET BY MOUTH THREE TIMES A WEEK. Monday, Wednesday, and Saturday 06/12/19  Yes Fenton, Clint R, PA  Multiple Vitamins-Minerals (CENTRUM SILVER PO) Take 1 tablet by mouth daily.   Yes [provider]  potassium chloride (KLOR-CON) 10 MEQ tablet Take 1 tablet by mouth on days metolazone are taken Patient taking differently: Take 10 mEq by mouth See admin instructions. Take 1 tablet by mouth on days metolazone are taken 06/19/19  Yes Fenton, Clint R, PA  SYNTHROID 88 MCG tablet TAKE 1 TABLET BY MOUTH ONCE DAILY BEFORE BREAKFAST Patient taking differently: Take 88 mcg by mouth daily before breakfast.  05/15/19   Yes Hoyt Koch, MD  vitamin C (ASCORBIC ACID) 500 MG tablet Take 500 mg by mouth daily.   Yes [provider]  XARELTO 15 MG TABS tablet TAKE 1 TABLET BY MOUTH ONCE DAILY WITH SUPPER Patient taking differently: Take 15 mg by mouth daily.  04/24/19  Yes Fenton, Clint R, PA  albuterol (PROVENTIL HFA;VENTOLIN HFA) 108 (90 Base) MCG/ACT inhaler Inhale 1-2 puffs into the lungs every 6 (six) hours as needed for wheezing or shortness of breath. 03/05/17   Sherwood Gambler, MD  Fluticasone-Umeclidin-Vilant (TRELEGY ELLIPTA) 100-62.5-25 MCG/INH AEPB Inhale 1 puff into the lungs daily. 02/05/19   Mannam,  Hart Robinsons, MD    Physical Exam: Vitals:   07/19/19 0434 07/19/19 0445 07/19/19 0500 07/19/19 0515  BP: (!) 110/95 (!) 122/57 (!) 125/91 121/62  Pulse: 70 70 70 70  Resp: (!) 23 (!) 21 (!) 23 (!) 31  Temp: (!) 101.1 F (38.4 C)     TempSrc: Rectal     SpO2: 99% 99% 97% 98%  Weight:      Height:        Constitutional: NAD, calm, comfortable Vitals:   07/19/19 0434 07/19/19 0445 07/19/19 0500 07/19/19 0515  BP: (!) 110/95 (!) 122/57 (!) 125/91 121/62  Pulse: 70 70 70 70  Resp: (!) 23 (!) 21 (!) 23 (!) 31  Temp: (!) 101.1 F (38.4 C)     TempSrc: Rectal     SpO2: 99% 99% 97% 98%  Weight:      Height:       Eyes: PERRL, lids and conjunctivae normal ENMT: Mucous membranes are dry . Posterior pharynx clear of any exudate or lesions.Normal dentition.  Neck: normal, supple, no masses, no thyromegaly Respiratory: clear to auscultation bilaterally, no wheezing, no crackles. Normal respiratory effort. No accessory muscle use.  Cardiovascular: Regular rate and rhythm, no murmurs / rubs / gallops. No extremity edema. 2+ pedal pulses. No carotid bruits.  Abdomen: + RUQ tenderness, no masses palpated. No hepatosplenomegaly. Bowel sounds positive. Soft , voluntary guarding Musculoskeletal: no clubbing / cyanosis. No joint deformity upper and lower extremities. Good ROM, no contractures.  Normal muscle tone.  Skin: no rashes, lesions, ulcers. No induration Neurologic: CN 2-12 grossly intact. Sensation intact, DTR normal. Strength 5/5 in all 4.  Psychiatric: Normal judgment and insight. Alert and oriented . Normal mood.    Labs on Admission: I have personally reviewed following labs and imaging studies  CBC: Recent Labs  Lab 07/19/19 0139  WBC 8.3  NEUTROABS 7.1  HGB 12.6  HCT 38.7  MCV 97.5  PLT [id number]   Basic Metabolic Panel: Recent Labs  Lab 07/19/19 0139  NA 133*  K 3.7  CL 94*  CO2 28  GLUCOSE 158*  BUN 31*  CREATININE 1.26*  CALCIUM 9.0   GFR: Estimated Creatinine Clearance: 27.4 mL/min (A) (by C-G formula based on SCr of 1.26 mg/dL (H)). Liver Function Tests: Recent Labs  Lab 07/19/19 0139  AST 138*  ALT 79*  ALKPHOS 90  BILITOT 1.5*  PROT 7.1  ALBUMIN 4.0   Recent Labs  Lab 07/19/19 0139  LIPASE 27   No results for input(s): AMMONIA in the last 168 hours. Coagulation Profile: Recent Labs  Lab 07/19/19 0318  INR 1.6*   Cardiac Enzymes: No results for input(s): CKTOTAL, CKMB, CKMBINDEX, TROPONINI in the last 168 hours. BNP (last 3 results) Recent Labs    08/06/18 1428  PROBNP 356.0*   HbA1C: No results for input(s): HGBA1C in the last 72 hours. CBG: No results for input(s): GLUCAP in the last 168 hours. Lipid Profile: No results for input(s): CHOL, HDL, LDLCALC, TRIG, CHOLHDL, LDLDIRECT in the last 72 hours. Thyroid Function Tests: No results for input(s): TSH, T4TOTAL, FREET4, T3FREE, THYROIDAB in the last 72 hours. Anemia Panel: No results for input(s): VITAMINB12, FOLATE, FERRITIN, TIBC, IRON, RETICCTPCT in the last 72 hours. Urine analysis:    Component Value Date/Time   COLORURINE YELLOW 07/19/2019 0139   APPEARANCEUR HAZY (A) 07/19/2019 0139   LABSPEC 1.018 07/19/2019 0139   PHURINE 6.0 07/19/2019 0139   GLUCOSEU NEGATIVE 07/19/2019 0139   HGBUR NEGATIVE 07/19/2019 0139  HGBUR 2+ 12/13/2008 1032    BILIRUBINUR NEGATIVE 07/19/2019 0139   BILIRUBINUR n 12/25/2010 1311   KETONESUR NEGATIVE 07/19/2019 0139   PROTEINUR 100 (A) 07/19/2019 0139   UROBILINOGEN 0.2 12/25/2010 1311   UROBILINOGEN 0.2 12/13/2008 1032   NITRITE NEGATIVE 07/19/2019 0139   LEUKOCYTESUR MODERATE (A) 07/19/2019 0139    Radiological Exams on Admission: CT ABDOMEN PELVIS W CONTRAST  Result Date: 07/19/2019 CLINICAL DATA:  Generalized abdominal pain with emesis EXAM: CT ABDOMEN AND PELVIS WITH CONTRAST TECHNIQUE: Multidetector CT imaging of the abdomen and pelvis was performed using the standard protocol following bolus administration of intravenous contrast. CONTRAST:  35mL OMNIPAQUE IOHEXOL 300 MG/ML  SOLN COMPARISON:  CT 07/25/2017 FINDINGS: Lower chest: Lung bases demonstrate no acute consolidation or pleural effusion. Cardiomegaly with incompletely visualized cardiac pacing leads. Hepatobiliary: Heterogenous enhancement of the liver. No calcified gallstone. Gallbladder wall thickening versus pericholecystic fluid. No biliary dilatation Pancreas: Unremarkable. No pancreatic ductal dilatation or surrounding inflammatory changes. Spleen: Normal in size without focal abnormality. Adrenals/Urinary Tract: Adrenal glands are unremarkable. Kidneys are normal, without renal calculi, focal lesion, or hydronephrosis. Bladder is unremarkable. Stomach/Bowel: Stomach is within normal limits. Sigmoid colon diverticula without acute inflammatory process. No evidence of bowel wall thickening, distention, or inflammatory changes. Vascular/Lymphatic: Moderate aortic atherosclerosis. No aneurysm. No suspicious adenopathy Reproductive: Status post hysterectomy. No adnexal masses. Other: Negative for free air or free fluid Musculoskeletal: No acute or suspicious osseous abnormality. 9 mm anterolisthesis L5 on S1 with degenerative changes. IMPRESSION: 1. Gallbladder wall thickening versus pericholecystic fluid though without calcified gallstone by  CT. Suggest correlation with ultrasound 2. Heterogenous enhancement of the liver, probably due to heterogeneous fat infiltration, though infiltrative mass could also be considered. 3. Sigmoid colon diverticula without acute inflammatory process Electronically Signed   By: Donavan Foil M.D.   On: 07/19/2019 03:51   US Abdomen Limited RUQ  Result Date: 07/19/2019 CLINICAL DATA:  Pain EXAM: ULTRASOUND ABDOMEN LIMITED RIGHT UPPER QUADRANT COMPARISON:  CT abdomen dated 07/19/2019. FINDINGS: Gallbladder: Pericholecystic fluid is present. There appears to be irregular thickening of the walls of the gallbladder fundus. Hyperechoic structure is seen along the nondependent gallbladder wall, presumed calcification. Sonographic Murphy's sign was elicited during the exam. Common bile duct: Diameter: 12 mm Liver: No focal lesion identified. Within normal limits in parenchymal echogenicity. Portal vein is patent on color Doppler imaging with normal direction of blood flow towards the liver. Other: None. IMPRESSION: 1. Findings highly suspicious for acute cholecystitis, with irregular thickening of the walls of the gallbladder fundus and pericholecystic fluid. Echogenic structure along the nondependent gallbladder wall, presumably a partially calcified wall (porcelain gallbladder). 2. Prominently distended common bile duct, measuring 12 mm diameter. Recommend correlation with liver function tests. If abnormal, would certainly consider further characterization with ERCP or MRCP. Electronically Signed   By: Franki Cabot M.D.   On: 07/19/2019 05:17    EKG: Independently reviewed. Paced   Assessment/Plan  Sepsis w/o shock  -due to gi source /vs gu source  - place on sepsis protocol  -goal directed therapy initiated in ed   Acute cholecystitis  -start on broad spectrum abx -npo  -us/ pending  -HIDA scan  -f/u on surgery recs    UTI -on broad spectrum abx  - f/u on culture data   Dehydration /mild aki  -due  to poor po intake  -ivfs  -monitor urine out put   Hyponatremia -due to low volume  -monitor labs    Afib on Xarelto  -  hold xarelto currently due to plan for possible surgery  -Continue bisoprolol 5 mg daily  HTN  -stable  -continue home medications   Chronic diastolic dysfunction  -no noted exacerbation  -will hold diuretic due to noted npo status and dehydration -monitor fluid status closely   CAD -no active issues   Hypothyroidism  -resume synthroid  HLD -resume home medications once tolerating po  RAD nos  Continue home inhalers  DVT prophylaxis: scd , start heparin sq in 24 hours  Code Status: FULL Family Communication: discussed plan of care with daughter [name] (Daughter)  [phone] (Mobile Disposition Plan: inpatient  Consults called: surgery Noon Admission status:in patient    Clance Boll MD [facility]  If 7PM-7AM, please contact night-coverage [email or web address] Password [facility]  07/19/2019, 5:55 AM

## 2019-07-19 NOTE — Consult Note (Addendum)
Consultation  Referring Provider:  [facility]/ Gherge MD Primary Care Physician:  [name], MD Primary Gastroenterologist:   Remote Dr. Olevia Perches, last seen in clinic 2015  Reason for Consultation: Acute abdominal pain nausea and vomiting  HPI: Beverly Kim is a 86 y.o. female ,, known remotely to Dr. Delfin Edis who presented to the emergency room early this morning after an episode of acute severe upper abdominal pain yesterday, lasting about 2 hours.  This was associated with shaking chills.  She also had nausea and vomiting. Patient says she has known gallstones and has had episodes of biliary colic in the past and not had pretty severe as the episode last night. She was noted to be febrile to 101. CT of the abdomen and pelvis showed gallbladder wall thickening versus pericholecystic fluid, no calcified stone, pancreas within normal limits there was heterogeneous enhancement of the liver.  Initially no comment on CBD but on other imaging radiologist noticed CBD of 1 cm on CT. Upper abdominal ultrasound shows pericholecystic fluid and irregular thickening of the gallbladder walls and fundus CBD 12 mm.  Findings consistent with acute cholecystitis. HIDA scan shows no gallbladder or biliary activity, raising question for CBD obstruction.  On admit WBC of 10.5 hemoglobin 11.3, potassium 3.1 T bili 2.5/alk phos 84/AST 226/ALT 167. INR 1.5 B NP 467 Lactate 3.0  Patient has history of congestive heart failure, last echo 2018 with EF of 60 to 65% atrial fibrillation for which she is on Xarelto, coronary artery disease and is status post pacemaker for AV block.  Also with hypertension, diverticulosis, colon polyps and is hard of hearing.  Patient has been evaluated by surgery who recommended HIDA scan and then percutaneous drainage versus surgery after cardiology consultation.  Patient is comfortable currently, afebrile and hungry.  She says the severe pain has resolved but she still tender  in the upper abdomen.    Past Medical History:  Diagnosis Date  . Atrial fibrillation ([place]) 08/16/2016   observed on PPM interrogation, asymptomatic, chad2vasc score is 4.  will consider anticoagulation  . CHF (congestive heart failure) ([place])   . Colon polyps   . Coronary artery disease   . Diverticulosis   . Dyspnea   . GERD (gastroesophageal reflux disease)   . Hearing loss of both ears   . Hepatic cyst   . Hiatal hernia   . Hyperlipidemia   . Hypertension   . Hypothyroidism   . Low back pain   . Mitral valve disorders(424.0)   . Other left bundle branch block   . Palpitations   . URI (upper respiratory infection)     Past Surgical History:  Procedure Laterality Date  . CARDIAC CATHETERIZATION  04-18-01  . CARDIOVERSION N/A 01/16/2019   Procedure: CARDIOVERSION;  Surgeon: Josue Hector, MD;  Location: [facility] ENDOSCOPY;  Service: Cardiovascular;  Laterality: N/A;  . COLONOSCOPY    . HERNIA REPAIR    . PACEMAKER IMPLANT Left 05/15/2016   SJM Assirity MRI dual chamber PPM implanted by Dr Rayann Heman for CHB  . THYROIDECTOMY    . TONSILLECTOMY AND ADENOIDECTOMY    . TOTAL ABDOMINAL HYSTERECTOMY W/ BILATERAL SALPINGOOPHORECTOMY      Prior to Admission medications   Medication Sig Start Date End Date Taking? Authorizing Provider  BIOTIN PO Take 1 tablet by mouth daily.   Yes [provider]  bisoprolol (ZEBETA) 5 MG tablet Take 1 tablet by mouth once daily Patient taking differently: Take 5 mg by mouth  daily.  05/28/19  Yes Burns, Claudina Lick, MD  Calcium Carb-Cholecalciferol (CALCIUM/VITAMIN D PO) Take 1 tablet by mouth daily.    Yes [provider]  calcium carbonate (TUMS - DOSED IN MG ELEMENTAL CALCIUM) 500 MG chewable tablet Chew 1 tablet by mouth daily as needed for indigestion or heartburn.   Yes [provider]  cholecalciferol (VITAMIN D3) 25 MCG (1000 UNIT) tablet Take 1,000 Units by mouth daily.   Yes [provider]  furosemide (LASIX) 40  MG tablet Take 1 tablet (40 mg total) by mouth daily. 02/06/19  Yes Burns, Claudina Lick, MD  metolazone (ZAROXOLYN) 2.5 MG tablet TAKE ONE TABLET BY MOUTH THREE TIMES A WEEK Patient taking differently: Take 2.5 mg by mouth See admin instructions. TAKE ONE TABLET BY MOUTH THREE TIMES A WEEK. Monday, Wednesday, and Saturday 06/12/19  Yes Fenton, Clint R, PA  Multiple Vitamins-Minerals (CENTRUM SILVER PO) Take 1 tablet by mouth daily.   Yes [provider]  potassium chloride (KLOR-CON) 10 MEQ tablet Take 1 tablet by mouth on days metolazone are taken Patient taking differently: Take 10 mEq by mouth See admin instructions. Take 1 tablet by mouth on days metolazone are taken 06/19/19  Yes Fenton, Clint R, PA  SYNTHROID 88 MCG tablet TAKE 1 TABLET BY MOUTH ONCE DAILY BEFORE BREAKFAST Patient taking differently: Take 88 mcg by mouth daily before breakfast.  05/15/19  Yes Hoyt Koch, MD  vitamin C (ASCORBIC ACID) 500 MG tablet Take 500 mg by mouth daily.   Yes [provider]  XARELTO 15 MG TABS tablet TAKE 1 TABLET BY MOUTH ONCE DAILY WITH SUPPER Patient taking differently: Take 15 mg by mouth daily.  04/24/19  Yes Fenton, Clint R, PA  albuterol (PROVENTIL HFA;VENTOLIN HFA) 108 (90 Base) MCG/ACT inhaler Inhale 1-2 puffs into the lungs every 6 (six) hours as needed for wheezing or shortness of breath. 03/05/17   Sherwood Gambler, MD  Fluticasone-Umeclidin-Vilant (TRELEGY ELLIPTA) 100-62.5-25 MCG/INH AEPB Inhale 1 puff into the lungs daily. 02/05/19   Marshell Garfinkel, MD    Current Facility-Administered Medications  Medication Dose Route Frequency Provider Last Rate Last Admin  . 0.9 %  sodium chloride infusion   Intravenous Continuous Clance Boll, MD 100 mL/hr at 07/19/19 1446 New Bag at 07/19/19 1446  . acetaminophen (TYLENOL) tablet 650 mg  650 mg Oral Q6H PRN Clance Boll, MD       Or  . acetaminophen (TYLENOL) suppository 650 mg  650 mg Rectal Q6H PRN Myles Rosenthal  A, MD      . albuterol (VENTOLIN HFA) 108 (90 Base) MCG/ACT inhaler 1-2 puff  1-2 puff Inhalation Q6H PRN Myles Rosenthal A, MD      . bisoprolol (ZEBETA) tablet 5 mg  5 mg Oral Daily Myles Rosenthal A, MD   5 mg at 07/19/19 1001  . [START ON 07/20/2019] cefTRIAXone (ROCEPHIN) 2 g in sodium chloride 0.9 % 100 mL IVPB  2 g Intravenous Q24H Clance Boll, MD      . Fluticasone-Umeclidin-Vilant 100-62.5-25 MCG/INH AEPB 1 puff  1 puff Inhalation Daily Myles Rosenthal A, MD      . levothyroxine (SYNTHROID) tablet 88 mcg  88 mcg Oral QAC breakfast Clance Boll, MD   88 mcg at 07/19/19 1000  . metroNIDAZOLE (FLAGYL) IVPB 500 mg  500 mg Intravenous Q8H Myles Rosenthal A, MD      . morphine 2 MG/ML injection 2 mg  2 mg Intravenous Q4H PRN Myles Rosenthal  A, MD      . ondansetron (ZOFRAN) tablet 4 mg  4 mg Oral Q6H PRN Clance Boll, MD       Or  . ondansetron [facility]) injection 4 mg  4 mg Intravenous Q6H PRN Clance Boll, MD       Current Outpatient Medications  Medication Sig Dispense Refill  . BIOTIN PO Take 1 tablet by mouth daily.    . bisoprolol (ZEBETA) 5 MG tablet Take 1 tablet by mouth once daily (Patient taking differently: Take 5 mg by mouth daily. ) 90 tablet 0  . Calcium Carb-Cholecalciferol (CALCIUM/VITAMIN D PO) Take 1 tablet by mouth daily.     . calcium carbonate (TUMS - DOSED IN MG ELEMENTAL CALCIUM) 500 MG chewable tablet Chew 1 tablet by mouth daily as needed for indigestion or heartburn.    . cholecalciferol (VITAMIN D3) 25 MCG (1000 UNIT) tablet Take 1,000 Units by mouth daily.    . furosemide (LASIX) 40 MG tablet Take 1 tablet (40 mg total) by mouth daily. 90 tablet 3  . metolazone (ZAROXOLYN) 2.5 MG tablet TAKE ONE TABLET BY MOUTH THREE TIMES A WEEK (Patient taking differently: Take 2.5 mg by mouth See admin instructions. TAKE ONE TABLET BY MOUTH THREE TIMES A WEEK. Monday, Wednesday, and Saturday) 30 tablet 5  . Multiple Vitamins-Minerals  (CENTRUM SILVER PO) Take 1 tablet by mouth daily.    . potassium chloride (KLOR-CON) 10 MEQ tablet Take 1 tablet by mouth on days metolazone are taken (Patient taking differently: Take 10 mEq by mouth See admin instructions. Take 1 tablet by mouth on days metolazone are taken) 30 tablet 3  . SYNTHROID 88 MCG tablet TAKE 1 TABLET BY MOUTH ONCE DAILY BEFORE BREAKFAST (Patient taking differently: Take 88 mcg by mouth daily before breakfast. ) 90 tablet 0  . vitamin C (ASCORBIC ACID) 500 MG tablet Take 500 mg by mouth daily.    Alveda Reasons 15 MG TABS tablet TAKE 1 TABLET BY MOUTH ONCE DAILY WITH SUPPER (Patient taking differently: Take 15 mg by mouth daily. ) 30 tablet 6  . albuterol (PROVENTIL HFA;VENTOLIN HFA) 108 (90 Base) MCG/ACT inhaler Inhale 1-2 puffs into the lungs every 6 (six) hours as needed for wheezing or shortness of breath. 1 Inhaler 0  . Fluticasone-Umeclidin-Vilant (TRELEGY ELLIPTA) 100-62.5-25 MCG/INH AEPB Inhale 1 puff into the lungs daily. 60 each 3    Allergies as of 07/19/2019 - Review Complete 07/19/2019  Allergen Reaction Noted  . Adhesive [tape] Itching, Dermatitis, Rash, and Other (See Comments)   . Ace inhibitors Cough 05/27/2016  . Atorvastatin Other (See Comments)   . Clindamycin Other (See Comments) 07/06/2009  . Codeine Other (See Comments) 12/02/2006  . Latex Other (See Comments) 08/12/2007  . Shellfish allergy Other (See Comments) 07/25/2017  . Simvastatin Other (See Comments) 07/06/2009  . Penicillins Rash 12/02/2006    Family History  Problem Relation Age of Onset  . Coronary artery disease Other        family hx of 1st degree relative <50  . Diabetes Other        family hx of  . Other Other        cardiovascular disorder family hx of  . Other Other        neurological disorder family hx of  . Other Other        respiratory disease family hx of  . Heart attack Daughter   . Heart Problems Other  all children  . Sudden death Son   . Heart disease  Brother   . Heart disease Sister   . Colon cancer Neg Hx   . Colon polyps Neg Hx     Social History   Socioeconomic History  . Marital status: Widowed    Spouse name: Not on file  . Number of children: 4  . Years of education: Not on file  . Highest education level: Not on file  Occupational History  . Occupation: retired  Tobacco Use  . Smoking status: Never Smoker  . Smokeless tobacco: Never Used  Substance and Sexual Activity  . Alcohol use: No  . Drug use: No  . Sexual activity: Never  Other Topics Concern  . Not on file  Social History Narrative  . Not on file   Social Determinants of Health   Financial Resource Strain:   . Difficulty of Paying Living Expenses:   Food Insecurity:   . Worried About Charity fundraiser in the Last Year:   . Arboriculturist in the Last Year:   Transportation Needs:   . Film/video editor (Medical):   Marland Kitchen Lack of Transportation (Non-Medical):   Physical Activity: Sufficiently Active  . Days of Exercise per Week: 5 days  . Minutes of Exercise per Session: 40 min  Stress:   . Feeling of Stress :   Social Connections:   . Frequency of Communication with Friends and Family:   . Frequency of Social Gatherings with Friends and Family:   . Attends Religious Services:   . Active Member of Clubs or Organizations:   . Attends Archivist Meetings:   Marland Kitchen Marital Status:   Intimate Partner Violence:   . Fear of Current or Ex-Partner:   . Emotionally Abused:   Marland Kitchen Physically Abused:   . Sexually Abused:     Review of Systems: Pertinent positive and negative review of systems were noted in the above HPI section.  All other review of systems was otherwise negative.  Physical Exam: Vital signs in last 24 hours: Temp:  [97.8 F (36.6 C)-101.1 F (38.4 C)] 101.1 F (38.4 C) (05/16 0434) Pulse Rate:  [63-72] 70 (05/16 1359) Resp:  [15-31] 15 (05/16 1359) BP: (110-139)/(50-95) 131/74 (05/16 1359) SpO2:  [95 %-99 %] 96 % (05/16  1359) Weight:  [75.3 kg] 75.3 kg (05/16 0120)   General:   Alert,  Well-developed, elderly white female well-nourished, pleasant and cooperative in NAD.  Hard of hearing, does much better reading lips, daughter at bedside Head:  Normocephalic and atraumatic. Eyes:  Sclera clear, no icterus.   Conjunctiva pink. Ears:  Normal auditory acuity. Nose:  No deformity, discharge,  or lesions. Mouth:  No deformity or lesions.   Neck:  Supple; no masses or thyromegaly. Lungs:  Clear throughout to auscultation.   No wheezes, crackles, or rhonchi. Heart:  Regular rate and rhythm; no murmurs, clicks, rubs,  or gallops.  Pacemaker left chest wall Abdomen:  Soft, she is quite tender in the right upper quadrant no rebound, BS active,nonpalp mass or hsm.   Rectal:  Deferred  Msk:  Symmetrical without gross deformities. . Pulses:  Normal pulses noted. Extremities:  Without clubbing or edema. Neurologic:  Alert and  oriented x4;  grossly normal neurologically.  Hard of hearing Skin:  Intact without significant lesions or rashes..mildly jaundiced Psych:  Alert and cooperative. Normal mood and affect.  Intake/Output from previous day: No intake/output data recorded. Intake/Output this shift: No  intake/output data recorded.  Lab Results: Recent Labs    07/19/19 0139 07/19/19 0635  WBC 8.3 10.5  HGB 12.6 11.3*  HCT 38.7 35.3*  PLT 174 156   BMET Recent Labs    07/19/19 0139 07/19/19 0635  NA 133* 138  K 3.7 3.1*  CL 94* 104  CO2 28 23  GLUCOSE 158* 159*  BUN 31* 25*  CREATININE 1.26* 0.97  CALCIUM 9.0 7.6*   LFT Recent Labs    07/19/19 0635  PROT 5.4*  ALBUMIN 3.0*  AST 226*  ALT 167*  ALKPHOS 84  BILITOT 2.5*   PT/INR Recent Labs    07/19/19 0318 07/19/19 0635  LABPROT 18.7* 17.1*  INR 1.6* 1.5*   Hepatitis Panel No results for input(s): HEPBSAG, HCVAB, HEPAIGM, HEPBIGM in the last 72 hours.    IMPRESSION:  #43 86 year old white female with known history of  cholelithiasis brought to the ER by EMS after a severe episode of upper abdominal pain last night lasting for about 2 hours, associated with nausea vomiting and chills. Patient febrile in the ER and tachypneic on arrival, elevated lactate and imaging consistent with acute cholecystitis, and early sepsis.  Patient also noted to have elevated LFTs with T bili of 2.5, and normal alk phos, and on ultrasound noted to have CBD of 12 mm, raising question of CBD stone. HIDA scan was done shows no uptake in the gallbladder or biliary activity   Both  CT and ultrasound are consistent with acute cholecystitis with pericholecystic fluid.  #2 atrial fibrillation-on Xarelto, last dose yesterday 3.  History of AV block status post pacemaker 4.  Congestive heart failure 5.  Coronary artery disease 6.  RUQ pain  Plan; keep n.p.o. Hold Xarelto Currently on IV Rocephin, penicillin allergic  Have discussed with Dr. Loletha Carrow, and also discussed with the patient and daughter.  She has acute cholecystitis with early sepsis and will either need to go to surgery after evaluated by cardiology today for cholecystectomy and IOC (if feasible with this degree of inflammation), or proceed with percutaneous drainage of the gallbladder per IR today.  Do not plan ERCP currently as do not feel that any potential CBD stone which is not definite is her primary problem, and more concerned with acute cholecystitis.    Amy EsterwoodPA-C   07/19/2019, 2:52 PM   I have reviewed the entire case in detail with the above APP and discussed the plan in detail.  Therefore, I agree with the diagnoses recorded above. In addition,  I have personally interviewed and examined the patient and have personally reviewed any abdominal/pelvic CT scan images.  My additional thoughts are as follows:  Severe right upper quadrant pain and tenderness from acute cholecystitis with sepsis, lactic acidosis and mild coagulopathy.  Elevated LFTs with  imaging suggesting biliary obstruction.  Ultrasound reports 12 mm common bile duct diameter and no HIDA tracer seen in small bowel.  While I see how these lab and imaging results raise the concern for an obstructing stone in the bile duct, I do not think that is causing the elevation of her liver labs.  Most notably, the alkaline phosphatase is normal, speaking against an obstructing stone in the bile duct. I believe the nonvisualization of the biliary tree on HIDA scan is from intrahepatic cholestasis due to the cholecystitis and sepsis.  There is always the possibility that with this severity of cholecystitis, there could be obstruction of the bile duct just from the inflammation.  However, even  in that scenario, the alkaline phosphatase should be elevated.  ERCP is not the next intervention for her.  She needs either cholecystectomy or cholecystostomy tube along with broad-spectrum antibiotics to get control of her infection.  We will follow closely, Dr. Ardis Hughs will manage the consult service starting tomorrow, and I will sign this patient out to him in detail.  If there are persistent elevations of the LFTs, and certainly if they should worsen, after gallbladder is either removed or drained, then further consideration will certainly be given to the possibility of an extrahepatic biliary obstruction.  Spoke with her daughter in detail throughout the entire encounter, all diagrams of the anatomy and description of radiographic findings were explained in detail and all questions answered.  Nelida Meuse III Office:[phone]

## 2019-07-19 NOTE — ED Notes (Signed)
Date and time results received: 07/19/19 4:29 AM  (use smartphrase ".now" to insert current time)  Test: Lactic Acid Critical Value: 4.7  Name of Provider Notified: Dr.Mesner  Orders Received? Or Actions Taken?

## 2019-07-19 NOTE — ED Triage Notes (Signed)
Pt is a 86 year old female whom comes to ed from home via ems, c/o of  Generalized abdominal pain started tonight around 9pm. Pt had 1 episode of emesis, and nausea. Pt has a hx of  constipation and gall bladder complications. Pt is alert x 4 with troubel of :hard of hearing. Pt has a history of CHF, and a left bundle branch block and elevated QTC, 481 no nausea medicine given by ems. V/s on arrival 140/80, map 100, pluse 83, spo2 95 room air, temp 97.5, cbg 216. Pt walks without assistance, and grand daughter is point of contact for care.     V/s on arrival

## 2019-07-19 NOTE — Consult Note (Signed)
Reason for Consult: possible cholecystitis Referring Physician: Dayna Barker, MD  Beverly Kim is an 86 y.o. female.  HPI:  Pt is a 86 yo F who developed sudden onset epigastric pain last night after eating.  She has a history of known gallstones due to prior biliary colic, but has had only a few attacks.  Previously declined surgery with Dr. Excell Seltzer in 2019.   She also had vomiting last night around the time she called EMS.  After throwing up, the severe pain eased off and she is just sore now.  She denies fever/chills.  She has mild shortness of breath.  She had a few firm bowel movements yesterday, but no diarrhea, and no significant change from her baseline.  She denies jaundice.    Past Medical History:  Diagnosis Date  . Atrial fibrillation ([place]) 08/16/2016   observed on PPM interrogation, asymptomatic, chad2vasc score is 4.  will consider anticoagulation  . CHF (congestive heart failure) ([place])   . Colon polyps   . Coronary artery disease   . Diverticulosis   . Dyspnea   . GERD (gastroesophageal reflux disease)   . Hearing loss of both ears   . Hepatic cyst   . Hiatal hernia   . Hyperlipidemia   . Hypertension   . Hypothyroidism   . Low back pain   . Mitral valve disorders(424.0)   . Other left bundle branch block   . Palpitations   . URI (upper respiratory infection)     Past Surgical History:  Procedure Laterality Date  . CARDIAC CATHETERIZATION  04-18-01  . CARDIOVERSION N/A 01/16/2019   Procedure: CARDIOVERSION;  Surgeon: Josue Hector, MD;  Location: [facility];  Service: Cardiovascular;  Laterality: N/A;  . COLONOSCOPY    . HERNIA REPAIR    . PACEMAKER IMPLANT Left 05/15/2016   SJM Assirity MRI dual chamber PPM implanted by Dr Rayann Heman for CHB  . THYROIDECTOMY    . TONSILLECTOMY AND ADENOIDECTOMY    . TOTAL ABDOMINAL HYSTERECTOMY W/ BILATERAL SALPINGOOPHORECTOMY      Family History  Problem Relation Age of Onset  . Coronary artery disease Other        family  hx of 1st degree relative <50  . Diabetes Other        family hx of  . Other Other        cardiovascular disorder family hx of  . Other Other        neurological disorder family hx of  . Other Other        respiratory disease family hx of  . Heart attack Daughter   . Heart Problems Other        all children  . Sudden death Son   . Heart disease Brother   . Heart disease Sister   . Colon cancer Neg Hx   . Colon polyps Neg Hx     Social History:  reports that she has never smoked. She has never used smokeless tobacco. She reports that she does not drink alcohol or use drugs.  Allergies:  Allergies  Allergen Reactions  . Adhesive [Tape] Itching, Dermatitis, Rash and Other (See Comments)    Blisters and "skin bubbles"  . Ace Inhibitors Cough  . Atorvastatin Other (See Comments)    REACTION: Reaction not known  . Clindamycin Other (See Comments)    Unknown  . Codeine Other (See Comments)    hallucinations   . Latex Other (See Comments)    blisters  . Shellfish  Allergy Other (See Comments)    "gallbladder attack"  . Simvastatin Other (See Comments)    REACTION: fatigue  . Penicillins Rash    Has patient had a PCN reaction causing immediate rash, facial/tongue/throat swelling, SOB or lightheadedness with hypotension: Unknown Has patient had a PCN reaction causing severe rash involving mucus membranes or skin necrosis: Unknown Has patient had a PCN reaction that required hospitalization: Unknown Has patient had a PCN reaction occurring within the last 10 years: No If all of the above answers are "NO", then may proceed with Cephalosporin use.     Medications:  Current Meds  Medication Sig  . BIOTIN PO Take 1 tablet by mouth daily.  . bisoprolol (ZEBETA) 5 MG tablet Take 1 tablet by mouth once daily (Patient taking differently: Take 5 mg by mouth daily. )  . Calcium Carb-Cholecalciferol (CALCIUM/VITAMIN D PO) Take 1 tablet by mouth daily.   . calcium carbonate (TUMS -  DOSED IN MG ELEMENTAL CALCIUM) 500 MG chewable tablet Chew 1 tablet by mouth daily as needed for indigestion or heartburn.  . cholecalciferol (VITAMIN D3) 25 MCG (1000 UNIT) tablet Take 1,000 Units by mouth daily.  . furosemide (LASIX) 40 MG tablet Take 1 tablet (40 mg total) by mouth daily.  . metolazone (ZAROXOLYN) 2.5 MG tablet TAKE ONE TABLET BY MOUTH THREE TIMES A WEEK (Patient taking differently: Take 2.5 mg by mouth See admin instructions. TAKE ONE TABLET BY MOUTH THREE TIMES A WEEK. Monday, Wednesday, and Saturday)  . Multiple Vitamins-Minerals (CENTRUM SILVER PO) Take 1 tablet by mouth daily.  . potassium chloride (KLOR-CON) 10 MEQ tablet Take 1 tablet by mouth on days metolazone are taken (Patient taking differently: Take 10 mEq by mouth See admin instructions. Take 1 tablet by mouth on days metolazone are taken)  . SYNTHROID 88 MCG tablet TAKE 1 TABLET BY MOUTH ONCE DAILY BEFORE BREAKFAST (Patient taking differently: Take 88 mcg by mouth daily before breakfast. )  . vitamin C (ASCORBIC ACID) 500 MG tablet Take 500 mg by mouth daily.  Alveda Reasons 15 MG TABS tablet TAKE 1 TABLET BY MOUTH ONCE DAILY WITH SUPPER (Patient taking differently: Take 15 mg by mouth daily. )     Results for orders placed or performed during the hospital encounter of 07/19/19 (from the past 48 hour(s))  CBC with Differential     Status: None   Collection Time: 07/19/19  1:39 AM  Result Value Ref Range   WBC 8.3 4.0 - 10.5 K/uL   RBC 3.97 3.87 - 5.11 MIL/uL   Hemoglobin 12.6 12.0 - 15.0 g/dL   HCT 38.7 36.0 - 46.0 %   MCV 97.5 80.0 - 100.0 fL   MCH 31.7 26.0 - 34.0 pg   MCHC 32.6 30.0 - 36.0 g/dL   RDW 13.7 11.5 - 15.5 %   Platelets 174 150 - 400 K/uL   nRBC 0.0 0.0 - 0.2 %   Neutrophils Relative % 85 %   Neutro Abs 7.1 1.7 - 7.7 K/uL   Lymphocytes Relative 11 %   Lymphs Abs 0.9 0.7 - 4.0 K/uL   Monocytes Relative 3 %   Monocytes Absolute 0.3 0.1 - 1.0 K/uL   Eosinophils Relative 1 %   Eosinophils  Absolute 0.1 0.0 - 0.5 K/uL   Basophils Relative 0 %   Basophils Absolute 0.0 0.0 - 0.1 K/uL   Immature Granulocytes 0 %   Abs Immature Granulocytes 0.03 0.00 - 0.07 K/uL    Comment: Performed at [facility]  [place] [address] [place], [place] [address]  Comprehensive metabolic panel     Status: Abnormal   Collection Time: 07/19/19  1:39 AM  Result Value Ref Range   Sodium 133 (L) 135 - 145 mmol/L   Potassium 3.7 3.5 - 5.1 mmol/L   Chloride 94 (L) 98 - 111 mmol/L   CO2 28 22 - 32 mmol/L   Glucose, Bld 158 (H) 70 - 99 mg/dL    Comment: Glucose reference range applies only to samples taken after fasting for at least 8 hours.   BUN 31 (H) 8 - 23 mg/dL   Creatinine, Ser 1.26 (H) 0.44 - 1.00 mg/dL   Calcium 9.0 8.9 - 10.3 mg/dL   Total Protein 7.1 6.5 - 8.1 g/dL   Albumin 4.0 3.5 - 5.0 g/dL   AST 138 (H) 15 - 41 U/L   ALT 79 (H) 0 - 44 U/L   Alkaline Phosphatase 90 38 - 126 U/L   Total Bilirubin 1.5 (H) 0.3 - 1.2 mg/dL   GFR calc non Af Amer 37 (L) >60 mL/min   GFR calc Af Amer 43 (L) >60 mL/min   Anion gap 11 5 - 15    Comment: Performed at [facility], [place] [address], [place], [place] [address]  Lipase, blood     Status: None   Collection Time: 07/19/19  1:39 AM  Result Value Ref Range   Lipase 27 11 - 51 U/L    Comment: Performed at [facility], [place] [address], [place], [place] [address]  Urinalysis, Routine w reflex microscopic     Status: Abnormal   Collection Time: 07/19/19  1:39 AM  Result Value Ref Range   Color, Urine YELLOW YELLOW   APPearance HAZY (A) CLEAR   Specific Gravity, Urine 1.018 1.005 - 1.030   pH 6.0 5.0 - 8.0   Glucose, UA NEGATIVE NEGATIVE mg/dL   Hgb urine dipstick NEGATIVE NEGATIVE   Bilirubin Urine NEGATIVE NEGATIVE   Ketones, ur NEGATIVE NEGATIVE mg/dL   Protein, ur 100 (A) NEGATIVE mg/dL   Nitrite NEGATIVE NEGATIVE   Leukocytes,Ua MODERATE (A) NEGATIVE   RBC / HPF 6-10 0 - 5 RBC/hpf    WBC, UA >50 (H) 0 - 5 WBC/hpf   Bacteria, UA RARE (A) NONE SEEN   Squamous Epithelial / LPF 0-5 0 - 5   Mucus PRESENT    Hyaline Casts, UA PRESENT     Comment: Performed at [facility], [place] [address] [place], [place] [address]  Lactic acid, plasma     Status: Abnormal   Collection Time: 07/19/19  3:18 AM  Result Value Ref Range   Lactic Acid, Venous 4.7 (HH) 0.5 - 1.9 mmol/L    Comment: CRITICAL RESULT CALLED TO, READ BACK BY AND VERIFIED WITH: HODGES,I AT 0423 ON 07/19/2019 BY MOSLEY,J Performed at [place] [address] [place], [place] [address]   APTT     Status: None   Collection Time: 07/19/19  3:18 AM  Result Value Ref Range   aPTT 33 24 - 36 seconds    Comment: Performed at [facility], [place] [address] [place],  [address]  Protime-INR     Status: Abnormal   Collection Time: 07/19/19  3:18 AM  Result Value Ref Range   Prothrombin Time 18.7 (H) 11.4 - 15.2 seconds   INR 1.6 (H) 0.8 - 1.2    Comment: (NOTE) INR goal varies based  on device and disease states. Performed at [facility], [place] [address] [place], [place] [address]   Lactic acid, plasma     Status: Abnormal   Collection Time: 07/19/19  4:45 AM  Result Value Ref Range   Lactic Acid, Venous 4.0 (HH) 0.5 - 1.9 mmol/L    Comment: CRITICAL VALUE NOTED.  VALUE IS CONSISTENT WITH PREVIOUSLY REPORTED AND CALLED VALUE. Performed at [facility], [place] [address], [place], [place] [address]   CBC with Differential     Status: Abnormal   Collection Time: 07/19/19  6:35 AM  Result Value Ref Range   WBC 10.5 4.0 - 10.5 K/uL   RBC 3.64 (L) 3.87 - 5.11 MIL/uL   Hemoglobin 11.3 (L) 12.0 - 15.0 g/dL   HCT 35.3 (L) 36.0 - 46.0 %   MCV 97.0 80.0 - 100.0 fL   MCH 31.0 26.0 - 34.0 pg   MCHC 32.0 30.0 - 36.0 g/dL   RDW 14.0 11.5 - 15.5 %   Platelets 156 150 - 400 K/uL   nRBC 0.0 0.0 - 0.2 %   Neutrophils Relative %  89 %   Neutro Abs 9.3 (H) 1.7 - 7.7 K/uL   Lymphocytes Relative 5 %   Lymphs Abs 0.5 (L) 0.7 - 4.0 K/uL   Monocytes Relative 6 %   Monocytes Absolute 0.6 0.1 - 1.0 K/uL   Eosinophils Relative 0 %   Eosinophils Absolute 0.0 0.0 - 0.5 K/uL   Basophils Relative 0 %   Basophils Absolute 0.0 0.0 - 0.1 K/uL   Immature Granulocytes 0 %   Abs Immature Granulocytes 0.04 0.00 - 0.07 K/uL    Comment: Performed at [facility], [place] [address] [place], [place] [address]  Comprehensive metabolic panel     Status: Abnormal   Collection Time: 07/19/19  6:35 AM  Result Value Ref Range   Sodium 138 135 - 145 mmol/L   Potassium 3.1 (L) 3.5 - 5.1 mmol/L   Chloride 104 98 - 111 mmol/L   CO2 23 22 - 32 mmol/L   Glucose, Bld 159 (H) 70 - 99 mg/dL    Comment: Glucose reference range applies only to samples taken after fasting for at least 8 hours.   BUN 25 (H) 8 - 23 mg/dL   Creatinine, Ser 0.97 0.44 - 1.00 mg/dL   Calcium 7.6 (L) 8.9 - 10.3 mg/dL   Total Protein 5.4 (L) 6.5 - 8.1 g/dL   Albumin 3.0 (L) 3.5 - 5.0 g/dL   AST 226 (H) 15 - 41 U/L   ALT 167 (H) 0 - 44 U/L   Alkaline Phosphatase 84 38 - 126 U/L   Total Bilirubin 2.5 (H) 0.3 - 1.2 mg/dL   GFR calc non Af Amer 51 (L) >60 mL/min   GFR calc Af Amer 59 (L) >60 mL/min   Anion gap 11 5 - 15    Comment: Performed at [facility], [place] [address] [place], [place] [address]  Protime-INR     Status: Abnormal   Collection Time: 07/19/19  6:35 AM  Result Value Ref Range   Prothrombin Time 17.1 (H) 11.4 - 15.2 seconds   INR 1.5 (H) 0.8 - 1.2    Comment: (NOTE) INR goal varies based on device and disease states. Performed at [facility], [place] [address] [place], [place] [address]   APTT     Status: None   Collection Time: 07/19/19  6:35 AM  Result Value Ref Range   aPTT 30 24 - 36 seconds    Comment: Performed at [facility], [place] [address] [place], [place]  [address]  TSH     Status: None   Collection Time: 07/19/19  6:38 AM  Result Value Ref Range   TSH 2.021 0.350 - 4.500 uIU/mL    Comment: Performed by a 3rd Generation assay with a functional sensitivity of <=0.01 uIU/mL. Performed at [facility], [place] [address], [place], [place] [address]   Lactic acid, plasma     Status: Abnormal   Collection Time: 07/19/19  8:35 AM  Result Value Ref Range   Lactic Acid, Venous 3.0 (HH) 0.5 - 1.9 mmol/L    Comment: CRITICAL VALUE NOTED.  VALUE IS CONSISTENT WITH PREVIOUSLY REPORTED AND CALLED VALUE. Performed at [facility], [place] [address] [place], [place] [address]     CT ABDOMEN PELVIS W CONTRAST  Result Date: 07/19/2019 CLINICAL DATA:  Generalized abdominal pain with emesis EXAM: CT ABDOMEN AND PELVIS WITH CONTRAST TECHNIQUE: Multidetector CT imaging of the abdomen and pelvis was performed using the standard protocol following bolus administration of intravenous contrast. CONTRAST:  44mL OMNIPAQUE IOHEXOL 300 MG/ML  SOLN COMPARISON:  CT 07/25/2017 FINDINGS: Lower chest: Lung bases demonstrate no acute consolidation or pleural effusion. Cardiomegaly with incompletely visualized cardiac pacing leads. Hepatobiliary: Heterogenous enhancement of the liver. No calcified gallstone. Gallbladder wall thickening versus pericholecystic fluid. No biliary dilatation Pancreas: Unremarkable. No pancreatic ductal dilatation or surrounding inflammatory changes. Spleen: Normal in size without focal abnormality. Adrenals/Urinary Tract: Adrenal glands are unremarkable. Kidneys are normal, without renal calculi, focal lesion, or hydronephrosis. Bladder is unremarkable. Stomach/Bowel: Stomach is within normal limits. Sigmoid colon diverticula without acute inflammatory process. No evidence of bowel wall thickening, distention, or inflammatory changes. Vascular/Lymphatic: Moderate aortic atherosclerosis. No aneurysm. No suspicious adenopathy  Reproductive: Status post hysterectomy. No adnexal masses. Other: Negative for free air or free fluid Musculoskeletal: No acute or suspicious osseous abnormality. 9 mm anterolisthesis L5 on S1 with degenerative changes. IMPRESSION: 1. Gallbladder wall thickening versus pericholecystic fluid though without calcified gallstone by CT. Suggest correlation with ultrasound 2. Heterogenous enhancement of the liver, probably due to heterogeneous fat infiltration, though infiltrative mass could also be considered. 3. Sigmoid colon diverticula without acute inflammatory process Electronically Signed   By: Donavan Foil M.D.   On: 07/19/2019 03:51   US Abdomen Limited RUQ  Result Date: 07/19/2019 CLINICAL DATA:  Pain EXAM: ULTRASOUND ABDOMEN LIMITED RIGHT UPPER QUADRANT COMPARISON:  CT abdomen dated 07/19/2019. FINDINGS: Gallbladder: Pericholecystic fluid is present. There appears to be irregular thickening of the walls of the gallbladder fundus. Hyperechoic structure is seen along the nondependent gallbladder wall, presumed calcification. Sonographic Murphy's sign was elicited during the exam. Common bile duct: Diameter: 12 mm Liver: No focal lesion identified. Within normal limits in parenchymal echogenicity. Portal vein is patent on color Doppler imaging with normal direction of blood flow towards the liver. Other: None. IMPRESSION: 1. Findings highly suspicious for acute cholecystitis, with irregular thickening of the walls of the gallbladder fundus and pericholecystic fluid. Echogenic structure along the nondependent gallbladder wall, presumably a partially calcified wall (porcelain gallbladder). 2. Prominently distended common bile duct, measuring 12 mm diameter. Recommend correlation with liver function tests. If abnormal, would certainly consider further characterization with ERCP or MRCP. Electronically Signed   By: Franki Cabot M.D.   On: 07/19/2019 05:17    Review of Systems  Constitutional: Negative.  HENT: Negative.   Eyes: Negative.   Respiratory: Positive for shortness of breath (mild, baseline).   Cardiovascular: Negative.   Gastrointestinal: Positive for abdominal pain, constipation and vomiting (x1). Negative for diarrhea and nausea.       Says sometimes she "builds up fluid in her abdomen."  Endocrine: Negative.   Genitourinary: Negative.   Musculoskeletal: Negative.   Skin: Negative.   Allergic/Immunologic: Negative.   Neurological:       Hard of hearing.  Hematological: Negative.   Psychiatric/Behavioral: Negative.      Blood pressure (!) 137/50, pulse 70, temperature (!) 101.1 F (38.4 C), temperature source Rectal, resp. rate 20, height 5' 2.5" (1.588 m), weight 75.3 kg, SpO2 98 %.   Physical Exam  Constitutional: She is oriented to person, place, and time. She appears well-developed and well-nourished. She appears distressed (mild).  HENT:  Head: Normocephalic and atraumatic.  Right Ear: External ear normal.  Left Ear: External ear normal.  Mouth is dry  Eyes: Pupils are equal, round, and reactive to light. Conjunctivae and EOM are normal. Right eye exhibits no discharge. Left eye exhibits no discharge. No scleral icterus.  Neck: No tracheal deviation present. No thyromegaly present.  Cardiovascular: Normal rate, normal heart sounds and intact distal pulses.  irreg  Respiratory: Effort normal and breath sounds normal.  GI: Soft. She exhibits no distension and no mass. There is abdominal tenderness (very mildly tender RUQ). There is no rebound and no guarding.  Musculoskeletal:        General: No tenderness, deformity or edema.     Cervical back: Neck supple.  Neurological: She is alert and oriented to person, place, and time. Coordination normal.  Skin: Skin is warm and dry. No rash noted. She is not diaphoretic. No erythema. No pallor.  Psychiatric: She has a normal mood and affect. Her behavior is normal. Judgment and thought content normal.       Assessment/Plan: Acute vs chronic cholecystitis.  Hypokalemia CAD with LBBB Chronic diastolic heart failure Asthma DM Atrial fibrillation  Agree with HIDA IV antibiotics Recommend cardiology evaluation and risk stratification.  Plan to be made after hida and cards eval.  Surgery vs perc chole tube vs iv antibiotics only.    Stark Klein 07/19/2019, 11:30 AM

## 2019-07-20 ENCOUNTER — Inpatient Hospital Stay (HOSPITAL_COMMUNITY): Payer: PPO

## 2019-07-20 DIAGNOSIS — R109 Unspecified abdominal pain: Secondary | ICD-10-CM

## 2019-07-20 LAB — COMPREHENSIVE METABOLIC PANEL
ALT: 135 U/L — ABNORMAL HIGH (ref 0–44)
AST: 122 U/L — ABNORMAL HIGH (ref 15–41)
Albumin: 3.1 g/dL — ABNORMAL LOW (ref 3.5–5.0)
Alkaline Phosphatase: 81 U/L (ref 38–126)
Anion gap: 7 (ref 5–15)
BUN: 23 mg/dL (ref 8–23)
CO2: 30 mmol/L (ref 22–32)
Calcium: 8.6 mg/dL — ABNORMAL LOW (ref 8.9–10.3)
Chloride: 105 mmol/L (ref 98–111)
Creatinine, Ser: 1.22 mg/dL — ABNORMAL HIGH (ref 0.44–1.00)
GFR calc Af Amer: 45 mL/min — ABNORMAL LOW (ref >60)
GFR calc non Af Amer: 38 mL/min — ABNORMAL LOW (ref >60)
Glucose, Bld: 126 mg/dL — ABNORMAL HIGH (ref 70–99)
Potassium: 4 mmol/L (ref 3.5–5.1)
Sodium: 142 mmol/L (ref 135–145)
Total Bilirubin: 3.6 mg/dL — ABNORMAL HIGH (ref 0.3–1.2)
Total Protein: 5.8 g/dL — ABNORMAL LOW (ref 6.5–8.1)

## 2019-07-20 LAB — URINE CULTURE: Culture: <10000 — AB

## 2019-07-20 LAB — CBC
HCT: 34.2 % — ABNORMAL LOW (ref 36.0–46.0)
Hemoglobin: 10.8 g/dL — ABNORMAL LOW (ref 12.0–15.0)
MCH: 31 pg (ref 26.0–34.0)
MCHC: 31.6 g/dL (ref 30.0–36.0)
MCV: 98.3 fL (ref 80.0–100.0)
Platelets: 141 10*3/uL — ABNORMAL LOW (ref 150–400)
RBC: 3.48 MIL/uL — ABNORMAL LOW (ref 3.87–5.11)
RDW: 14.2 % (ref 11.5–15.5)
WBC: 9.2 10*3/uL (ref 4.0–10.5)
nRBC: 0 % (ref 0.0–0.2)

## 2019-07-20 MED ORDER — SODIUM CHLORIDE 0.9 % IV SOLN
INTRAVENOUS | Status: DC | PRN
  Administered 2019-07-20 – 2019-07-21 (×2): 1000 mL via INTRAVENOUS
  Administered 2019-07-21: 250 mL via INTRAVENOUS

## 2019-07-20 MED ORDER — LORAZEPAM 2 MG/ML IJ SOLN
0.2500 mg | Freq: Once | INTRAMUSCULAR | Status: AC
  Administered 2019-07-20: 0.25 mg via INTRAVENOUS
  Filled 2019-07-20: qty 1

## 2019-07-20 NOTE — Progress Notes (Signed)
Orders for CXR and ativan IV received.

## 2019-07-20 NOTE — Progress Notes (Addendum)
Progress Note  Patient Name: Beverly Kim Date of Encounter: 07/20/2019  Primary Cardiologist: Minus Breeding, MD   Subjective   Patient was able to sleep last night but occasionally woke up with abd pain. LFTs improving. Plan for cholecystectomy vs perc drain.   Inpatient Medications    Scheduled Meds: . bisoprolol  5 mg Oral Daily  . fluticasone furoate-vilanterol  1 puff Inhalation Daily  . levothyroxine  88 mcg Oral QAC breakfast  . umeclidinium bromide  1 puff Inhalation Daily   Continuous Infusions: . cefTRIAXone (ROCEPHIN)  IV 2 g (07/20/19 0238)  . metronidazole 500 mg (07/20/19 0342)   PRN Meds: acetaminophen **OR** acetaminophen, albuterol, morphine injection, ondansetron **OR** ondansetron (ZOFRAN) IV   Vital Signs    Vitals:   07/19/19 2116 07/20/19 0100 07/20/19 0519 07/20/19 0748  BP: 112/64 124/68 139/68   Pulse: 69 72 68   Resp: 20  16   Temp: 97.9 F (36.6 C) 97.6 F (36.4 C) 97.9 F (36.6 C)   TempSrc: Oral Oral Oral   SpO2: 92% 94% 98% 98%  Weight:      Height:        Intake/Output Summary (Last 24 hours) at 07/20/2019 0812 Last data filed at 07/20/2019 0521 Gross per 24 hour  Intake 509.74 ml  Output 4 ml  Net 505.74 ml   Last 3 Weights 07/19/2019 07/19/2019 06/19/2019  Weight (lbs) 167 lb 166 lb 169 lb 3.2 oz  Weight (kg) 75.751 kg 75.297 kg 76.749 kg      Telemetry    V-paced rhythm, HR 70s - Personally Reviewed  ECG    No new - Personally Reviewed  Physical Exam   GEN: No acute distress.   Neck: No JVD Cardiac: RRR, no murmurs, rubs, or gallops.  Respiratory: Clear to auscultation bilaterally. GI: +tender, non-distended  MS: No edema; No deformity. Neuro:  Nonfocal  Psych: Normal affect   Labs    High Sensitivity Troponin:  No results for input(s): TROPONINIHS in the last 720 hours.    Chemistry Recent Labs  Lab 07/19/19 0139 07/19/19 0139 07/19/19 0635 07/19/19 1533 07/20/19 0419  NA 133*  --  138  --  142    K 3.7  --  3.1*  --  4.0  CL 94*  --  104  --  105  CO2 28  --  23  --  30  GLUCOSE 158*  --  159*  --  126*  BUN 31*  --  25*  --  23  CREATININE 1.26*  --  0.97  --  1.22*  CALCIUM 9.0  --  7.6*  --  8.6*  PROT 7.1   < > 5.4* 5.7* 5.8*  ALBUMIN 4.0   < > 3.0* 3.1* 3.1*  AST 138*   < > 226* 226* 122*  ALT 79*   < > 167* 166* 135*  ALKPHOS 90   < > 84 87 81  BILITOT 1.5*   < > 2.5* 2.5* 3.6*  GFRNONAA 37*  --  51*  --  38*  GFRAA 43*  --  59*  --  45*  ANIONGAP 11  --  11  --  7   < > = values in this interval not displayed.     Hematology Recent Labs  Lab 07/19/19 0139 07/19/19 0635 07/20/19 0419  WBC 8.3 10.5 9.2  RBC 3.97 3.64* 3.48*  HGB 12.6 11.3* 10.8*  HCT 38.7 35.3* 34.2*  MCV 97.5 97.0  98.3  MCH 31.7 31.0 31.0  MCHC 32.6 32.0 31.6  RDW 13.7 14.0 14.2  PLT 174 156 141*    BNP Recent Labs  Lab 07/19/19 0638  BNP 467.4*     DDimer No results for input(s): DDIMER in the last 168 hours.   Radiology    NM Hepatobiliary Liver Func  Result Date: 07/19/2019 CLINICAL DATA:  Evaluate for cholecystitis. Right upper quadrant pain. EXAM: NUCLEAR MEDICINE HEPATOBILIARY IMAGING TECHNIQUE: Sequential images of the abdomen were obtained out to 60 minutes following intravenous administration of radiopharmaceutical. RADIOPHARMACEUTICALS:  5.3 mCi Tc-16m  Choletec IV COMPARISON:  CT of the abdomen dated 07/19/2019 and abdominal sonogram from 07/19/2019. FINDINGS: Following the IV administration of the radiopharmaceutical there is prompt radiotracer uptake within the liver. No gallbladder, biliary activity, or small bowel activity identified after 120 minutes of imaging. Review of the CT from 07/19/2019 shows evidence of biliary obstruction with a dilated common bile duct measuring 1 cm which abruptly terminates at the level of the head of pancreas. This may be due to common bile duct stones or underlying mass. IMPRESSION: 1. Imaging findings are compatible with common bile duct  obstruction which correlates to the findings from CT dated 07/19/2019. This may be due to common bile duct stones or mass. As the patient has a pacemaker and may not qualify for MRCP recommend gastrointestinal consultation for possible ERCP. Electronically Signed   By: Kerby Moors M.D.   On: 07/19/2019 14:17   CT ABDOMEN PELVIS W CONTRAST  Result Date: 07/19/2019 CLINICAL DATA:  Generalized abdominal pain with emesis EXAM: CT ABDOMEN AND PELVIS WITH CONTRAST TECHNIQUE: Multidetector CT imaging of the abdomen and pelvis was performed using the standard protocol following bolus administration of intravenous contrast. CONTRAST:  19mL OMNIPAQUE IOHEXOL 300 MG/ML  SOLN COMPARISON:  CT 07/25/2017 FINDINGS: Lower chest: Lung bases demonstrate no acute consolidation or pleural effusion. Cardiomegaly with incompletely visualized cardiac pacing leads. Hepatobiliary: Heterogenous enhancement of the liver. No calcified gallstone. Gallbladder wall thickening versus pericholecystic fluid. No biliary dilatation Pancreas: Unremarkable. No pancreatic ductal dilatation or surrounding inflammatory changes. Spleen: Normal in size without focal abnormality. Adrenals/Urinary Tract: Adrenal glands are unremarkable. Kidneys are normal, without renal calculi, focal lesion, or hydronephrosis. Bladder is unremarkable. Stomach/Bowel: Stomach is within normal limits. Sigmoid colon diverticula without acute inflammatory process. No evidence of bowel wall thickening, distention, or inflammatory changes. Vascular/Lymphatic: Moderate aortic atherosclerosis. No aneurysm. No suspicious adenopathy Reproductive: Status post hysterectomy. No adnexal masses. Other: Negative for free air or free fluid Musculoskeletal: No acute or suspicious osseous abnormality. 9 mm anterolisthesis L5 on S1 with degenerative changes. IMPRESSION: 1. Gallbladder wall thickening versus pericholecystic fluid though without calcified gallstone by CT. Suggest correlation  with ultrasound 2. Heterogenous enhancement of the liver, probably due to heterogeneous fat infiltration, though infiltrative mass could also be considered. 3. Sigmoid colon diverticula without acute inflammatory process Electronically Signed   By: Donavan Foil M.D.   On: 07/19/2019 03:51   US Abdomen Limited RUQ  Result Date: 07/19/2019 CLINICAL DATA:  Pain EXAM: ULTRASOUND ABDOMEN LIMITED RIGHT UPPER QUADRANT COMPARISON:  CT abdomen dated 07/19/2019. FINDINGS: Gallbladder: Pericholecystic fluid is present. There appears to be irregular thickening of the walls of the gallbladder fundus. Hyperechoic structure is seen along the nondependent gallbladder wall, presumed calcification. Sonographic Murphy's sign was elicited during the exam. Common bile duct: Diameter: 12 mm Liver: No focal lesion identified. Within normal limits in parenchymal echogenicity. Portal vein is patent on color Doppler imaging with  normal direction of blood flow towards the liver. Other: None. IMPRESSION: 1. Findings highly suspicious for acute cholecystitis, with irregular thickening of the walls of the gallbladder fundus and pericholecystic fluid. Echogenic structure along the nondependent gallbladder wall, presumably a partially calcified wall (porcelain gallbladder). 2. Prominently distended common bile duct, measuring 12 mm diameter. Recommend correlation with liver function tests. If abnormal, would certainly consider further characterization with ERCP or MRCP. Electronically Signed   By: Franki Cabot M.D.   On: 07/19/2019 05:17    Cardiac Studies   Echo 2018 Study Conclusions   - Left ventricle: The cavity size was normal. There was moderate  focal basal hypertrophy of the septum. Systolic function was  normal. The estimated ejection fraction was in the range of 60%  to 65%. Wall motion was normal; there were no regional wall  motion abnormalities. Doppler parameters are consistent with  abnormal left  ventricular relaxation (grade 1 diastolic  dysfunction).  - Aortic valve: Trileaflet; mildly thickened, mildly calcified  leaflets. There was trivial regurgitation.  - Mitral valve: There was mild regurgitation.  - Left atrium: Volume/bsa, S: 32.7 ml/m^2.  - Tricuspid valve: There was mild regurgitation.   Impressions:   - EF is improved when compared to prior study.   Patient Profile     86 y.o. female with a hx of afib on Xarelto, complete HB s/p PPM, nonobstructive CAD (50-57% LAD stenosis on cath in 2003), chronic diastolic HF, LBBB, hypothyroidism, HTN, HLD who is being seen for pre-op evaluation.   Assessment & Plan    Pre-op evaluation for cholecystectomy - Patient is very functional for her age although METS <4 - She has a history of nonobstructive CAD by cath in 2003 - Echo in 2018 with EF 60-65%, G1DD - According to revised cardiac risk index patient is 2-3, 10-15% of 30 day cardiac risk of MI, cardiac risk or death.  - she is overall intermediate risk for procedure - No plans for further cardiac testing prior to procedure - Plan per surgery/IM for perc drain vs cholecystectomy  Afib - CHADSVASC = 6 (CHF, HTN, age x2, CAD, female) - Xarelto held for procedure. Restart once able to post-op - bisoprolol for rate control  Chronic diastolic HF  - on lasix 40 daily and metolazone 2.5mg  TID - Euvolemic on exam - cautious with IV fluids  For questions or updates, please contact [place] Please consult [email or web address] for contact info under     Signed, Cadence Ninfa Meeker, PA-C  07/20/2019, 8:12 AM    The patient was seen, examined and discussed with Cadence Ninfa Meeker, PA-C   and I agree with the above.   The patient denies any current chest pain, she continues to have mild epigastric pain, she is to discuss plan with surgeon later today but tentatively scheduled for tomorrow afternoon. Telemetry shows V paced rhythm with ventricular rates in 70s.  The patient is  very functional, clearly can achieve 4 METS, no chest pain recently, no signs of CHF, she appears euvolemic on physical exam, I would continue the same medications, she is currently considered intermediate risk for intermediate risk surgery only based on her risk factors.  There is currently no contraindication from cardiac standpoint to proceed with her surgery.  Xarelto was held for procedure.  Consider starting heparin drip, and restart Xarelto as soon as acceptable from surgical standpoint post her surgery.  We will sign off, please call us with any questions.  Ena Dawley, MD 07/20/2019

## 2019-07-20 NOTE — Progress Notes (Signed)
Patient resting in bed with respirations even, eyes closed. Daughter remains at bedside at this time.

## 2019-07-20 NOTE — Progress Notes (Signed)
Called in to room by patient. Patient feels as though she cannot breath. Vitals stable, RR normal, and lungs clear to auscultation. Patient has felt like this before at home but not this bad per patient and daughter. Paged Ardith Dark on call for hospitalist to discuss findings.

## 2019-07-20 NOTE — H&P (View-Only) (Signed)
Progress Note  CC:    Abdominal pain, nausea,      ASSESSMENT AND PLAN:   86 year-old female with PMH significant for atrial fibrillation chronic anticoagulation, history of PPM, CHF, PAD  # Cholecystitis with possible CBD obstruction.  --Afebrile, normal WBC. On flagyl / Rocephin.  --CCS spoke with Radiologist this am about HIDA findings. Sounds like biliary obstruction favored over hepatic dysfunction leading to false positive study.  --Bilirubin up further today 2.5 >>> 3.6 though interestingly her alk phos is normal.  --Plan is for ERCP tomorrow. Discussed procedure in detail with patient, daughter and granddaughter in the room. The benefits and risks including but not limited to bleeding, infection, pancreatitis and even death were discussed. Patient agrees to proceed.  --NPO after midnight    SUBJECTIVE    Lower abdominal pain today which has started to move towards upper abdomen. Pain med helping   OBJECTIVE:     Vital signs in last 24 hours: Temp:  [97.6 F (36.4 C)-98.2 F (36.8 C)] 97.9 F (36.6 C) (05/17 0519) Pulse Rate:  [68-78] 68 (05/17 0519) Resp:  [15-24] 16 (05/17 0519) BP: (112-139)/(40-74) 139/68 (05/17 0519) SpO2:  [92 %-98 %] 98 % (05/17 0748) Weight:  [75.8 kg] 75.8 kg (05/16 1659) Last BM Date: 07/20/19 General:   Alert, in NAD. Daughter and Granddaughter in room Heart:  Regular rate and rhythm.  No lower extremity edema   Pulm: Normal respiratory effort   Abdomen:  Soft,  nontender, nondistended.  Normal bowel sounds.          Neurologic:  Alert and  oriented,  grossly normal neurologically. Psych:  Pleasant, cooperative.  Normal mood and affect.   Intake/Output from previous day: 05/16 0701 - 05/17 0700 In: 509.7 [I.V.:409.7; IV Piggyback:100] Out: 2 [Urine:2] Intake/Output this shift: No intake/output data recorded.  Lab Results: Recent Labs    07/19/19 0139 07/19/19 0635 07/20/19 0419  WBC 8.3 10.5 9.2  HGB 12.6 11.3* 10.8*   HCT 38.7 35.3* 34.2*  PLT 174 156 141*   BMET Recent Labs    07/19/19 0139 07/19/19 0635 07/20/19 0419  NA 133* 138 142  K 3.7 3.1* 4.0  CL 94* 104 105  CO2 _0 GLUCOSE 158* 159* 126*  BUN 31* 25* 23  CREATININE 1.26* 0.97 1.22*  CALCIUM 9.0 7.6* 8.6*   LFT Recent Labs    07/19/19 1533 07/19/19 1533 07/20/19 0419  PROT 5.7*   < > 5.8*  ALBUMIN 3.1*   < > 3.1*  AST 226*   < > 122*  ALT 166*   < > 135*  ALKPHOS 87   < > 81  BILITOT 2.5*   < > 3.6*  BILIDIR 1.2*  --   --   IBILI 1.3*  --   --    < > = values in this interval not displayed.   PT/INR Recent Labs    07/19/19 0318 07/19/19 0635  LABPROT 18.7* 17.1*  INR 1.6* 1.5*   Hepatitis Panel No results for input(s): HEPBSAG, HCVAB, HEPAIGM, HEPBIGM in the last 72 hours.  NM Hepatobiliary Liver Func  Result Date: 07/19/2019 CLINICAL DATA:  Evaluate for cholecystitis. Right upper quadrant pain. EXAM: NUCLEAR MEDICINE HEPATOBILIARY IMAGING TECHNIQUE: Sequential images of the abdomen were obtained out to 60 minutes following intravenous administration of radiopharmaceutical. RADIOPHARMACEUTICALS:  5.3 mCi Tc-51m Choletec IV COMPARISON:  CT of the abdomen dated 07/19/2019 and abdominal sonogram from 07/19/2019. FINDINGS: Following the  IV administration of the radiopharmaceutical there is prompt radiotracer uptake within the liver. No gallbladder, biliary activity, or small bowel activity identified after 120 minutes of imaging. Review of the CT from 07/19/2019 shows evidence of biliary obstruction with a dilated common bile duct measuring 1 cm which abruptly terminates at the level of the head of pancreas. This may be due to common bile duct stones or underlying mass. IMPRESSION: 1. Imaging findings are compatible with common bile duct obstruction which correlates to the findings from CT dated 07/19/2019. This may be due to common bile duct stones or mass. As the patient has a pacemaker and may not qualify for MRCP  recommend gastrointestinal consultation for possible ERCP. Electronically Signed   By: Kerby Moors M.D.   On: 07/19/2019 14:17   CT ABDOMEN PELVIS W CONTRAST  Result Date: 07/19/2019 CLINICAL DATA:  Generalized abdominal pain with emesis EXAM: CT ABDOMEN AND PELVIS WITH CONTRAST TECHNIQUE: Multidetector CT imaging of the abdomen and pelvis was performed using the standard protocol following bolus administration of intravenous contrast. CONTRAST:  56m OMNIPAQUE IOHEXOL 300 MG/ML  SOLN COMPARISON:  CT 07/25/2017 FINDINGS: Lower chest: Lung bases demonstrate no acute consolidation or pleural effusion. Cardiomegaly with incompletely visualized cardiac pacing leads. Hepatobiliary: Heterogenous enhancement of the liver. No calcified gallstone. Gallbladder wall thickening versus pericholecystic fluid. No biliary dilatation Pancreas: Unremarkable. No pancreatic ductal dilatation or surrounding inflammatory changes. Spleen: Normal in size without focal abnormality. Adrenals/Urinary Tract: Adrenal glands are unremarkable. Kidneys are normal, without renal calculi, focal lesion, or hydronephrosis. Bladder is unremarkable. Stomach/Bowel: Stomach is within normal limits. Sigmoid colon diverticula without acute inflammatory process. No evidence of bowel wall thickening, distention, or inflammatory changes. Vascular/Lymphatic: Moderate aortic atherosclerosis. No aneurysm. No suspicious adenopathy Reproductive: Status post hysterectomy. No adnexal masses. Other: Negative for free air or free fluid Musculoskeletal: No acute or suspicious osseous abnormality. 9 mm anterolisthesis L5 on S1 with degenerative changes. IMPRESSION: 1. Gallbladder wall thickening versus pericholecystic fluid though without calcified gallstone by CT. Suggest correlation with ultrasound 2. Heterogenous enhancement of the liver, probably due to heterogeneous fat infiltration, though infiltrative mass could also be considered. 3. Sigmoid colon  diverticula without acute inflammatory process Electronically Signed   By: KDonavan FoilM.D.   On: 07/19/2019 03:51   [place]Abdomen Limited RUQ  Result Date: 07/19/2019 CLINICAL DATA:  Pain EXAM: ULTRASOUND ABDOMEN LIMITED RIGHT UPPER QUADRANT COMPARISON:  CT abdomen dated 07/19/2019. FINDINGS: Gallbladder: Pericholecystic fluid is present. There appears to be irregular thickening of the walls of the gallbladder fundus. Hyperechoic structure is seen along the nondependent gallbladder wall, presumed calcification. Sonographic Murphy's sign was elicited during the exam. Common bile duct: Diameter: 12 mm Liver: No focal lesion identified. Within normal limits in parenchymal echogenicity. Portal vein is patent on color Doppler imaging with normal direction of blood flow towards the liver. Other: None. IMPRESSION: 1. Findings highly suspicious for acute cholecystitis, with irregular thickening of the walls of the gallbladder fundus and pericholecystic fluid. Echogenic structure along the nondependent gallbladder wall, presumably a partially calcified wall (porcelain gallbladder). 2. Prominently distended common bile duct, measuring 12 mm diameter. Recommend correlation with liver function tests. If abnormal, would certainly consider further characterization with ERCP or MRCP. Electronically Signed   By: SFranki CabotM.D.   On: 07/19/2019 05:17      LOS: 1 day   PTye Savoy,NP 07/20/2019, 10:59 AM    ________________________________________________________________________  LVelora HecklerGI MD note:  I personally examined the patient, reviewed the  data and agree with the assessment and plan described above.  HIDA may actually show extrahepatic bile duct obstruction rather than hepatic congestion.  Also CT scan not perfectly normal in the region of the bile duct. I'm planning ERCP tomorrow to gather more information, will stent and brush if a stricture is discovered.   Owens Loffler, MD [facility]  Gastroenterology Pager [phone]

## 2019-07-20 NOTE — Progress Notes (Signed)
Patient ID: Beverly Kim, female   DOB: 1924/07/03, 86 y.o.   MRN: [id number]       Subjective: Patient still with some RUQ abdominal tenderness today as well as some across her lower abdomen.  Questioning if she has a UTI  ROS: See above, otherwise other systems negative  Objective: Vital signs in last 24 hours: Temp:  [97.6 F (36.4 C)-98.2 F (36.8 C)] 97.9 F (36.6 C) (05/17 0519) Pulse Rate:  [68-78] 68 (05/17 0519) Resp:  [15-24] 16 (05/17 0519) BP: (112-139)/(40-74) 139/68 (05/17 0519) SpO2:  [92 %-98 %] 98 % (05/17 0748) Weight:  [75.8 kg] 75.8 kg (05/16 1659) Last BM Date: 07/19/19  Intake/Output from previous day: 05/16 0701 - 05/17 0700 In: 509.7 [I.V.:409.7; IV Piggyback:100] Out: 4 [Urine:3; Stool:1] Intake/Output this shift: No intake/output data recorded.  PE: Gen: NAD HEENT: PERRL Neck: trachea midline Heart: regular Lungs: CTAB Abd: soft, tender in RUQ, and lower central abdomen, +BS, ND MS: no edema Neuro: sensation normal throughout Psych: alert and oriented, normal affect  Lab Results:  Recent Labs    07/19/19 0635 07/20/19 0419  WBC 10.5 9.2  HGB 11.3* 10.8*  HCT 35.3* 34.2*  PLT 156 141*   BMET Recent Labs    07/19/19 0635 07/20/19 0419  NA 138 142  K 3.1* 4.0  CL 104 105  CO2 23 30  GLUCOSE 159* 126*  BUN 25* 23  CREATININE 0.97 1.22*  CALCIUM 7.6* 8.6*   PT/INR Recent Labs    07/19/19 0318 07/19/19 0635  LABPROT 18.7* 17.1*  INR 1.6* 1.5*   CMP     Component Value Date/Time   NA 142 07/20/2019 0419   NA 142 06/26/2017 1218   K 4.0 07/20/2019 0419   CL 105 07/20/2019 0419   CO2 30 07/20/2019 0419   GLUCOSE 126 (H) 07/20/2019 0419   BUN 23 07/20/2019 0419   BUN 18 06/26/2017 1218   CREATININE 1.22 (H) 07/20/2019 0419   CALCIUM 8.6 (L) 07/20/2019 0419   PROT 5.8 (L) 07/20/2019 0419   ALBUMIN 3.1 (L) 07/20/2019 0419   AST 122 (H) 07/20/2019 0419   ALT 135 (H) 07/20/2019 0419   ALKPHOS 81 07/20/2019 0419   BILITOT 3.6 (H) 07/20/2019 0419   GFRNONAA 38 (L) 07/20/2019 0419   GFRAA 45 (L) 07/20/2019 0419   Lipase     Component Value Date/Time   LIPASE 27 07/19/2019 0139       Studies/Results: NM Hepatobiliary Liver Func  Result Date: 07/19/2019 CLINICAL DATA:  Evaluate for cholecystitis. Right upper quadrant pain. EXAM: NUCLEAR MEDICINE HEPATOBILIARY IMAGING TECHNIQUE: Sequential images of the abdomen were obtained out to 60 minutes following intravenous administration of radiopharmaceutical. RADIOPHARMACEUTICALS:  5.3 mCi Tc-32m  Choletec IV COMPARISON:  CT of the abdomen dated 07/19/2019 and abdominal sonogram from 07/19/2019. FINDINGS: Following the IV administration of the radiopharmaceutical there is prompt radiotracer uptake within the liver. No gallbladder, biliary activity, or small bowel activity identified after 120 minutes of imaging. Review of the CT from 07/19/2019 shows evidence of biliary obstruction with a dilated common bile duct measuring 1 cm which abruptly terminates at the level of the head of pancreas. This may be due to common bile duct stones or underlying mass. IMPRESSION: 1. Imaging findings are compatible with common bile duct obstruction which correlates to the findings from CT dated 07/19/2019. This may be due to common bile duct stones or mass. As the patient has a pacemaker and may not qualify for MRCP  recommend gastrointestinal consultation for possible ERCP. Electronically Signed   By: Kerby Moors M.D.   On: 07/19/2019 14:17   CT ABDOMEN PELVIS W CONTRAST  Result Date: 07/19/2019 CLINICAL DATA:  Generalized abdominal pain with emesis EXAM: CT ABDOMEN AND PELVIS WITH CONTRAST TECHNIQUE: Multidetector CT imaging of the abdomen and pelvis was performed using the standard protocol following bolus administration of intravenous contrast. CONTRAST:  74mL OMNIPAQUE IOHEXOL 300 MG/ML  SOLN COMPARISON:  CT 07/25/2017 FINDINGS: Lower chest: Lung bases demonstrate no acute  consolidation or pleural effusion. Cardiomegaly with incompletely visualized cardiac pacing leads. Hepatobiliary: Heterogenous enhancement of the liver. No calcified gallstone. Gallbladder wall thickening versus pericholecystic fluid. No biliary dilatation Pancreas: Unremarkable. No pancreatic ductal dilatation or surrounding inflammatory changes. Spleen: Normal in size without focal abnormality. Adrenals/Urinary Tract: Adrenal glands are unremarkable. Kidneys are normal, without renal calculi, focal lesion, or hydronephrosis. Bladder is unremarkable. Stomach/Bowel: Stomach is within normal limits. Sigmoid colon diverticula without acute inflammatory process. No evidence of bowel wall thickening, distention, or inflammatory changes. Vascular/Lymphatic: Moderate aortic atherosclerosis. No aneurysm. No suspicious adenopathy Reproductive: Status post hysterectomy. No adnexal masses. Other: Negative for free air or free fluid Musculoskeletal: No acute or suspicious osseous abnormality. 9 mm anterolisthesis L5 on S1 with degenerative changes. IMPRESSION: 1. Gallbladder wall thickening versus pericholecystic fluid though without calcified gallstone by CT. Suggest correlation with ultrasound 2. Heterogenous enhancement of the liver, probably due to heterogeneous fat infiltration, though infiltrative mass could also be considered. 3. Sigmoid colon diverticula without acute inflammatory process Electronically Signed   By: Donavan Foil M.D.   On: 07/19/2019 03:51   US Abdomen Limited RUQ  Result Date: 07/19/2019 CLINICAL DATA:  Pain EXAM: ULTRASOUND ABDOMEN LIMITED RIGHT UPPER QUADRANT COMPARISON:  CT abdomen dated 07/19/2019. FINDINGS: Gallbladder: Pericholecystic fluid is present. There appears to be irregular thickening of the walls of the gallbladder fundus. Hyperechoic structure is seen along the nondependent gallbladder wall, presumed calcification. Sonographic Murphy's sign was elicited during the exam. Common  bile duct: Diameter: 12 mm Liver: No focal lesion identified. Within normal limits in parenchymal echogenicity. Portal vein is patent on color Doppler imaging with normal direction of blood flow towards the liver. Other: None. IMPRESSION: 1. Findings highly suspicious for acute cholecystitis, with irregular thickening of the walls of the gallbladder fundus and pericholecystic fluid. Echogenic structure along the nondependent gallbladder wall, presumably a partially calcified wall (porcelain gallbladder). 2. Prominently distended common bile duct, measuring 12 mm diameter. Recommend correlation with liver function tests. If abnormal, would certainly consider further characterization with ERCP or MRCP. Electronically Signed   By: Franki Cabot M.D.   On: 07/19/2019 05:17    Anti-infectives: Anti-infectives (From admission, onward)   Start     Dose/Rate Route Frequency Ordered Stop   07/20/19 0400  cefTRIAXone (ROCEPHIN) 2 g in sodium chloride 0.9 % 100 mL IVPB     2 g 200 mL/hr over 30 Minutes Intravenous Every 24 hours 07/19/19 0633     07/19/19 1200  metroNIDAZOLE (FLAGYL) IVPB 500 mg     500 mg 100 mL/hr over 60 Minutes Intravenous Every 8 hours 07/19/19 0633     07/19/19 0330  cefTRIAXone (ROCEPHIN) 2 g in sodium chloride 0.9 % 100 mL IVPB     2 g 200 mL/hr over 30 Minutes Intravenous  Once 07/19/19 0318 07/19/19 0507   07/19/19 0330  metroNIDAZOLE (FLAGYL) IVPB 500 mg     500 mg 100 mL/hr over 60 Minutes Intravenous  Once 07/19/19 0318  07/19/19 0605       Assessment/Plan Hypokalemia - resolved CAD with LBBB Chronic diastolic heart failure Asthma DM Atrial fibrillation  Cholecystitis with possible CBD obstruction -patient continues to have some RUQ pain on her exam along with pain in her lower abdomen.  She was concerned about a UTI, but culture shows only 10,000 colonies -imaging of her CT scan and HIDA reviewed with Dr. Dema Severin and discussed with Dr. Ardis Hughs (GI) -her TB increased  today from 2 to 3.6. -I have had a long discussion with Dr. Tery Sanfilippo with radiology reviewing her HIDA scan and her CT scan.  Her gallbladder does not appear to excrete radiotracer into her biliary system.  This can be from biliary obstruction vs hepatic dysfunction.  Given her liver uptakes the radiotracer quickly and well along with clearing of the blood pool on her scan, he feels this is more c/w biliary obstruction over hepatic dysfunction.  I have discussed this with Dr. Ardis Hughs as well.  At this point, the plan will be for ERCP to further evaluate these findings either tomorrow or Wednesday prior to any surgical intervention, pending findings. -if all goes well with ERCP and patient still have symptoms of cholecystitis, then we will need to have a discussion with the family regarding how we proceed.  Cards has states she is intermediate to high risk.  She may need perc chole vs possible lap chole pending that discussion. -Cont to follow and continue abx therapy at this time. -given no plans for interventions today, can likely have CLD  FEN - NPO, likely CLD, NPO p MN VTE - SCDs ID - Rocephin/Flagyl   LOS: 1 day    Henreitta Cea , [facility] Surgery 07/20/2019, 10:29 AM Please see Amion for pager number during day hours 7:00am-4:30pm or 7:00am -11:30am on weekends

## 2019-07-20 NOTE — Progress Notes (Addendum)
   Progress Note  CC:    Abdominal pain, nausea,      ASSESSMENT AND PLAN:   86 year-old female with PMH significant for atrial fibrillation chronic anticoagulation, history of PPM, CHF, PAD  # Cholecystitis with possible CBD obstruction.  --Afebrile, normal WBC. On flagyl / Rocephin.  --CCS spoke with Radiologist this am about HIDA findings. Sounds like biliary obstruction favored over hepatic dysfunction leading to false positive study.  --Bilirubin up further today 2.5 >>> 3.6 though interestingly her alk phos is normal.  --Plan is for ERCP tomorrow. Discussed procedure in detail with patient, daughter and granddaughter in the room. The benefits and risks including but not limited to bleeding, infection, pancreatitis and even death were discussed. Patient agrees to proceed.  --NPO after midnight    SUBJECTIVE    Lower abdominal pain today which has started to move towards upper abdomen. Pain med helping   OBJECTIVE:     Vital signs in last 24 hours: Temp:  [97.6 F (36.4 C)-98.2 F (36.8 C)] 97.9 F (36.6 C) (05/17 0519) Pulse Rate:  [68-78] 68 (05/17 0519) Resp:  [15-24] 16 (05/17 0519) BP: (112-139)/(40-74) 139/68 (05/17 0519) SpO2:  [92 %-98 %] 98 % (05/17 0748) Weight:  [75.8 kg] 75.8 kg (05/16 1659) Last BM Date: 07/20/19 General:   Alert, in NAD. Daughter and Granddaughter in room Heart:  Regular rate and rhythm.  No lower extremity edema   Pulm: Normal respiratory effort   Abdomen:  Soft,  nontender, nondistended.  Normal bowel sounds.          Neurologic:  Alert and  oriented,  grossly normal neurologically. Psych:  Pleasant, cooperative.  Normal mood and affect.   Intake/Output from previous day: 05/16 0701 - 05/17 0700 In: 509.7 [I.V.:409.7; IV Piggyback:100] Out: 2 [Urine:2] Intake/Output this shift: No intake/output data recorded.  Lab Results: Recent Labs    07/19/19 0139 07/19/19 0635 07/20/19 0419  WBC 8.3 10.5 9.2  HGB 12.6 11.3* 10.8*   HCT 38.7 35.3* 34.2*  PLT 174 156 141*   BMET Recent Labs    07/19/19 0139 07/19/19 0635 07/20/19 0419  NA 133* 138 142  K 3.7 3.1* 4.0  CL 94* 104 105  CO2 28 23 30  GLUCOSE 158* 159* 126*  BUN 31* 25* 23  CREATININE 1.26* 0.97 1.22*  CALCIUM 9.0 7.6* 8.6*   LFT Recent Labs    07/19/19 1533 07/19/19 1533 07/20/19 0419  PROT 5.7*   < > 5.8*  ALBUMIN 3.1*   < > 3.1*  AST 226*   < > 122*  ALT 166*   < > 135*  ALKPHOS 87   < > 81  BILITOT 2.5*   < > 3.6*  BILIDIR 1.2*  --   --   IBILI 1.3*  --   --    < > = values in this interval not displayed.   PT/INR Recent Labs    07/19/19 0318 07/19/19 0635  LABPROT 18.7* 17.1*  INR 1.6* 1.5*   Hepatitis Panel No results for input(s): HEPBSAG, HCVAB, HEPAIGM, HEPBIGM in the last 72 hours.  NM Hepatobiliary Liver Func  Result Date: 07/19/2019 CLINICAL DATA:  Evaluate for cholecystitis. Right upper quadrant pain. EXAM: NUCLEAR MEDICINE HEPATOBILIARY IMAGING TECHNIQUE: Sequential images of the abdomen were obtained out to 60 minutes following intravenous administration of radiopharmaceutical. RADIOPHARMACEUTICALS:  5.3 mCi Tc-99m  Choletec IV COMPARISON:  CT of the abdomen dated 07/19/2019 and abdominal sonogram from 07/19/2019. FINDINGS: Following the   IV administration of the radiopharmaceutical there is prompt radiotracer uptake within the liver. No gallbladder, biliary activity, or small bowel activity identified after 120 minutes of imaging. Review of the CT from 07/19/2019 shows evidence of biliary obstruction with a dilated common bile duct measuring 1 cm which abruptly terminates at the level of the head of pancreas. This may be due to common bile duct stones or underlying mass. IMPRESSION: 1. Imaging findings are compatible with common bile duct obstruction which correlates to the findings from CT dated 07/19/2019. This may be due to common bile duct stones or mass. As the patient has a pacemaker and may not qualify for MRCP  recommend gastrointestinal consultation for possible ERCP. Electronically Signed   By: Taylor  Stroud M.D.   On: 07/19/2019 14:17   CT ABDOMEN PELVIS W CONTRAST  Result Date: 07/19/2019 CLINICAL DATA:  Generalized abdominal pain with emesis EXAM: CT ABDOMEN AND PELVIS WITH CONTRAST TECHNIQUE: Multidetector CT imaging of the abdomen and pelvis was performed using the standard protocol following bolus administration of intravenous contrast. CONTRAST:  80mL OMNIPAQUE IOHEXOL 300 MG/ML  SOLN COMPARISON:  CT 07/25/2017 FINDINGS: Lower chest: Lung bases demonstrate no acute consolidation or pleural effusion. Cardiomegaly with incompletely visualized cardiac pacing leads. Hepatobiliary: Heterogenous enhancement of the liver. No calcified gallstone. Gallbladder wall thickening versus pericholecystic fluid. No biliary dilatation Pancreas: Unremarkable. No pancreatic ductal dilatation or surrounding inflammatory changes. Spleen: Normal in size without focal abnormality. Adrenals/Urinary Tract: Adrenal glands are unremarkable. Kidneys are normal, without renal calculi, focal lesion, or hydronephrosis. Bladder is unremarkable. Stomach/Bowel: Stomach is within normal limits. Sigmoid colon diverticula without acute inflammatory process. No evidence of bowel wall thickening, distention, or inflammatory changes. Vascular/Lymphatic: Moderate aortic atherosclerosis. No aneurysm. No suspicious adenopathy Reproductive: Status post hysterectomy. No adnexal masses. Other: Negative for free air or free fluid Musculoskeletal: No acute or suspicious osseous abnormality. 9 mm anterolisthesis L5 on S1 with degenerative changes. IMPRESSION: 1. Gallbladder wall thickening versus pericholecystic fluid though without calcified gallstone by CT. Suggest correlation with ultrasound 2. Heterogenous enhancement of the liver, probably due to heterogeneous fat infiltration, though infiltrative mass could also be considered. 3. Sigmoid colon  diverticula without acute inflammatory process Electronically Signed   By: Kim  Fujinaga M.D.   On: 07/19/2019 03:51   US Abdomen Limited RUQ  Result Date: 07/19/2019 CLINICAL DATA:  Pain EXAM: ULTRASOUND ABDOMEN LIMITED RIGHT UPPER QUADRANT COMPARISON:  CT abdomen dated 07/19/2019. FINDINGS: Gallbladder: Pericholecystic fluid is present. There appears to be irregular thickening of the walls of the gallbladder fundus. Hyperechoic structure is seen along the nondependent gallbladder wall, presumed calcification. Sonographic Murphy's sign was elicited during the exam. Common bile duct: Diameter: 12 mm Liver: No focal lesion identified. Within normal limits in parenchymal echogenicity. Portal vein is patent on color Doppler imaging with normal direction of blood flow towards the liver. Other: None. IMPRESSION: 1. Findings highly suspicious for acute cholecystitis, with irregular thickening of the walls of the gallbladder fundus and pericholecystic fluid. Echogenic structure along the nondependent gallbladder wall, presumably a partially calcified wall (porcelain gallbladder). 2. Prominently distended common bile duct, measuring 12 mm diameter. Recommend correlation with liver function tests. If abnormal, would certainly consider further characterization with ERCP or MRCP. Electronically Signed   By: Stan  Maynard M.D.   On: 07/19/2019 05:17      LOS: 1 day   Paula Guenther ,NP 07/20/2019, 10:59 AM    ________________________________________________________________________  [facility] MD note:  I personally examined the patient, reviewed the   data and agree with the assessment and plan described above.  HIDA may actually show extrahepatic bile duct obstruction rather than hepatic congestion.  Also CT scan not perfectly normal in the region of the bile duct. I'm planning ERCP tomorrow to gather more information, will stent and brush if a stricture is discovered.   Alessia Gonsalez, MD [facility] Pager [phone]        

## 2019-07-20 NOTE — Progress Notes (Signed)
PROGRESS NOTE  Beverly Kim [id number] DOB: 08-18-1924 DOA: 07/19/2019 PCP: [name], MD   LOS: 1 day   Brief Narrative / Interim history: 86 year old female with history of A. fib on Xarelto, presence of pacemaker for AV block, CAD, chronic diastolic CHF, hypothyroidism, hypertension, hyperlipidemia, cholelithiasis and biliary colic came to the hospital with acute onset abdominal pain.  She has had an episode like this before and was told that she needs a cholecystectomy however per patient did not want to do that at that time.  CT scan showed gallbladder wall thickening and an ultrasound was highly suspicious for acute cholecystitis with a prominently distended CBD measuring 12 mm in diameter.  She had mild LFT elevation AST, ALT are 138, 79 as well as an elevated bilirubin of 1.5.  Lipase was normal.  Subjective / 24h Interval events: Had an episode of abdominal pain overnight, no nausea.  No shortness of breath, no chest pain  Assessment & Plan: Principal Problem Acute cholecystitis with sepsis, also possible CBD obstruction, POA-patient febrile in the ED, tachypneic, concerning for infectious source with her gallbladder.    Both general surgery and gastroenterology teams were consulted, she seems to be very high risk for surgery and is not clear what is obstructing her CBD -Tentative plans currently are for ERCP tomorrow by GI as initial step -Her bilirubin continues to increase  Active problems UTI  -mild UA abnormalities, continue antibiotics for above, await cultures  Creatinine elevation-does not meet criteria for AKI, creatinine overall stable  Hyponatremia-monitor  Afib on Xarelto, complete AV block status post pacemaker-hold xarelto currently due to plan for ERCP -Continue bisoprolol 5 mg daily  HTN -stable   Chronic diastolic dysfunction  -no noted exacerbation   CAD -no active issues   Hypothyroidism  -resume synthroid  HLD -resume home  medications once tolerating po   Scheduled Meds: . bisoprolol  5 mg Oral Daily  . fluticasone furoate-vilanterol  1 puff Inhalation Daily  . levothyroxine  88 mcg Oral QAC breakfast  . umeclidinium bromide  1 puff Inhalation Daily   Continuous Infusions: . cefTRIAXone (ROCEPHIN)  IV 2 g (07/20/19 0238)  . metronidazole 500 mg (07/20/19 1209)   PRN Meds:.acetaminophen **OR** acetaminophen, albuterol, morphine injection, ondansetron **OR** ondansetron (ZOFRAN) IV  DVT prophylaxis: SCDs Code Status: Full code Family Communication: No family at bedside  Status is: Inpatient  Remains inpatient appropriate because:Abnormal LFTs, ERCP planned for tomorrow followed by potential surgery   Dispo: The patient is from: Home              Anticipated d/c is to: Home              Anticipated d/c date is: 3 days              Patient currently is not medically stable to d/c.  Consultants:  Gastroenterology   Procedures:  None   Microbiology  None   Antimicrobials: Ceftriaxone 5/16 >> Metronidazole 5/16 >>    Objective: Vitals:   07/19/19 2116 07/20/19 0100 07/20/19 0519 07/20/19 0748  BP: 112/64 124/68 139/68   Pulse: 69 72 68   Resp: 20  16   Temp: 97.9 F (36.6 C) 97.6 F (36.4 C) 97.9 F (36.6 C)   TempSrc: Oral Oral Oral   SpO2: 92% 94% 98% 98%  Weight:      Height:        Intake/Output Summary (Last 24 hours) at 07/20/2019 1226 Last data filed at 07/20/2019  [id number] Gross per 24 hour  Intake 509.74 ml  Output 2 ml  Net 507.74 ml   Filed Weights   07/19/19 0120 07/19/19 1659  Weight: 75.3 kg 75.8 kg    Examination:  Constitutional: NAD Eyes: no scleral icterus ENMT: Mucous membranes are moist.  Neck: normal, supple Respiratory: clear to auscultation bilaterally, no wheezing, no crackles. Cardiovascular: Regular rate and rhythm. No LE edema. Good peripheral pulses Abdomen: Tender to palpation in the right upper quadrant Musculoskeletal: no clubbing /  cyanosis.  Skin: No rashes seen Neurologic: Nonfocal, equal strength  Data Reviewed: I have independently reviewed following labs and imaging studies   CBC: Recent Labs  Lab 07/19/19 0139 07/19/19 0635 07/20/19 0419  WBC 8.3 10.5 9.2  NEUTROABS 7.1 9.3*  --   HGB 12.6 11.3* 10.8*  HCT 38.7 35.3* 34.2*  MCV 97.5 97.0 98.3  PLT 174 156 [id number]*   Basic Metabolic Panel: Recent Labs  Lab 07/19/19 0139 07/19/19 0635 07/20/19 0419  NA 133* 138 142  K 3.7 3.1* 4.0  CL 94* 104 105  CO2 28 23 30   GLUCOSE 158* 159* 126*  BUN 31* 25* 23  CREATININE 1.26* 0.97 1.22*  CALCIUM 9.0 7.6* 8.6*   Liver Function Tests: Recent Labs  Lab 07/19/19 0139 07/19/19 0635 07/19/19 1533 07/20/19 0419  AST 138* 226* 226* 122*  ALT 79* 167* 166* 135*  ALKPHOS 90 84 87 81  BILITOT 1.5* 2.5* 2.5* 3.6*  PROT 7.1 5.4* 5.7* 5.8*  ALBUMIN 4.0 3.0* 3.1* 3.1*   Coagulation Profile: Recent Labs  Lab 07/19/19 0318 07/19/19 0635  INR 1.6* 1.5*   HbA1C: No results for input(s): HGBA1C in the last 72 hours. CBG: No results for input(s): GLUCAP in the last 168 hours.  Recent Results (from the past 240 hour(s))  Blood Culture (routine x 2)     Status: None (Preliminary result)   Collection Time: 07/19/19  3:18 AM   Specimen: BLOOD  Result Value Ref Range Status   Specimen Description   Final    BLOOD LEFT ARM Performed at [facility] [address], [place], [place] [address]    Special Requests   Final    BOTTLES DRAWN AEROBIC AND ANAEROBIC Blood Culture adequate volume Performed at [facility] [address] [place], [place] [address]    Culture   Final    NO GROWTH 1 DAY Performed at [facility], [place] [address] [place], [place] [address]    Report Status PENDING  Incomplete  Blood Culture (routine x 2)     Status: None (Preliminary result)   Collection Time: 07/19/19  3:23 AM   Specimen: BLOOD  Result Value Ref Range Status    Specimen Description   Final    BLOOD RIGHT WRIST Performed at [facility] [address], [place], [place] [address]    Special Requests   Final    BOTTLES DRAWN AEROBIC AND ANAEROBIC Blood Culture adequate volume Performed at [facility] [address], [place], [place] [address]    Culture   Final    NO GROWTH 1 DAY Performed at [facility], [place] [address] [place], [place] [address]    Report Status PENDING  Incomplete  Urine culture     Status: Abnormal   Collection Time: 07/19/19  4:30 AM   Specimen: Urine, Random  Result Value Ref Range Status   Specimen Description   Final  URINE, RANDOM Performed at [facility], [place] [address] [place], [place] [address]    Special Requests   Final    NONE Performed at [facility], [place] [address], [place], [place] [address]    Culture MULTIPLE SPECIES PRESENT, SUGGEST RECOLLECTION (A)  Final   Report Status 07/20/2019 FINAL  Final  Urine culture     Status: Abnormal   Collection Time: 07/19/19  6:38 AM   Specimen: Urine, Random  Result Value Ref Range Status   Specimen Description   Final    URINE, RANDOM Performed at [facility] [address] [place], [place] [address]    Special Requests   Final    NONE Performed at [facility], [place] [address] [place], [place] [address]    Culture (A)  Final    <10,000 COLONIES/mL INSIGNIFICANT GROWTH Performed at [facility] [address], [place], [place] [address]    Report Status 07/20/2019 FINAL  Final  SARS Coronavirus 2 by RT PCR (hospital order, performed in [facility] hospital lab) Nasopharyngeal Nasopharyngeal Swab     Status: None   Collection Time: 07/19/19  3:00 PM   Specimen: Nasopharyngeal Swab  Result Value Ref Range Status   SARS Coronavirus 2 NEGATIVE NEGATIVE Final    Comment: (NOTE) SARS-CoV-2 target nucleic acids are NOT  DETECTED. The SARS-CoV-2 RNA is generally detectable in upper and lower respiratory specimens during the acute phase of infection. The lowest concentration of SARS-CoV-2 viral copies this assay can detect is 250 copies / mL. A negative result does not preclude SARS-CoV-2 infection and should not be used as the sole basis for treatment or other patient management decisions.  A negative result may occur with improper specimen collection / handling, submission of specimen other than nasopharyngeal swab, presence of viral mutation(s) within the areas targeted by this assay, and inadequate number of viral copies (<250 copies / mL). A negative result must be combined with clinical observations, patient history, and epidemiological information. Fact Sheet for Patients:   StrictlyIdeas.no Fact Sheet for Healthcare Providers: BankingDealers.co.za This test is not yet approved or cleared  by the [place] FDA and has been authorized for detection and/or diagnosis of SARS-CoV-2 by FDA under an Emergency Use Authorization (EUA).  This EUA will remain in effect (meaning this test can be used) for the duration of the COVID-19 declaration under Section 564(b)(1) of the Act, 21 U.S.C. section 360bbb-3(b)(1), unless the authorization is terminated or revoked sooner. Performed at [facility], [place] [address], [place],  [address]      Radiology Studies: NM Hepatobiliary Liver Func  Result Date: 07/19/2019 CLINICAL DATA:  Evaluate for cholecystitis. Right upper quadrant pain. EXAM: NUCLEAR MEDICINE HEPATOBILIARY IMAGING TECHNIQUE: Sequential images of the abdomen were obtained out to 60 minutes following intravenous administration of radiopharmaceutical. RADIOPHARMACEUTICALS:  5.3 mCi Tc-57m  Choletec IV COMPARISON:  CT of the abdomen dated 07/19/2019 and abdominal sonogram from 07/19/2019. FINDINGS: Following the IV  administration of the radiopharmaceutical there is prompt radiotracer uptake within the liver. No gallbladder, biliary activity, or small bowel activity identified after 120 minutes of imaging. Review of the CT from 07/19/2019 shows evidence of biliary obstruction with a dilated common bile duct measuring 1 cm which abruptly terminates at the level of the head of pancreas. This may be due to common bile duct stones or underlying mass. IMPRESSION: 1. Imaging findings are compatible with common bile duct obstruction which  correlates to the findings from CT dated 07/19/2019. This may be due to common bile duct stones or mass. As the patient has a pacemaker and may not qualify for MRCP recommend gastrointestinal consultation for possible ERCP. Electronically Signed   By: Kerby Moors M.D.   On: 07/19/2019 14:17    Marzetta Board, MD, PhD [facility]  Between 7 am - 7 pm I am available, please contact me via Amion or Securechat  Between 7 pm - 7 am I am not available, please contact night coverage MD/APP via Amion

## 2019-07-21 ENCOUNTER — Inpatient Hospital Stay (HOSPITAL_COMMUNITY): Payer: PPO

## 2019-07-21 ENCOUNTER — Inpatient Hospital Stay (HOSPITAL_COMMUNITY): Payer: PPO | Admitting: Anesthesiology

## 2019-07-21 ENCOUNTER — Encounter (HOSPITAL_COMMUNITY)
Admission: EM | Disposition: A | Discharge status: Home Health | Payer: Self-pay | Source: Home / Self Care | Attending: Internal Medicine

## 2019-07-21 ENCOUNTER — Encounter (HOSPITAL_COMMUNITY): Payer: Self-pay | Admitting: Internal Medicine

## 2019-07-21 DIAGNOSIS — R17 Unspecified jaundice: Secondary | ICD-10-CM

## 2019-07-21 HISTORY — PX: ERCP: SHX5425

## 2019-07-21 HISTORY — PX: SPHINCTEROTOMY: SHX5544

## 2019-07-21 HISTORY — PX: REMOVAL OF STONES: SHX5545

## 2019-07-21 LAB — COMPREHENSIVE METABOLIC PANEL
ALT: 114 U/L — ABNORMAL HIGH (ref 0–44)
AST: 89 U/L — ABNORMAL HIGH (ref 15–41)
Albumin: 3.5 g/dL (ref 3.5–5.0)
Alkaline Phosphatase: 95 U/L (ref 38–126)
Anion gap: 13 (ref 5–15)
BUN: 23 mg/dL (ref 8–23)
CO2: 25 mmol/L (ref 22–32)
Calcium: 8.7 mg/dL — ABNORMAL LOW (ref 8.9–10.3)
Chloride: 99 mmol/L (ref 98–111)
Creatinine, Ser: 1.19 mg/dL — ABNORMAL HIGH (ref 0.44–1.00)
GFR calc Af Amer: 46 mL/min — ABNORMAL LOW (ref >60)
GFR calc non Af Amer: 40 mL/min — ABNORMAL LOW (ref >60)
Glucose, Bld: 127 mg/dL — ABNORMAL HIGH (ref 70–99)
Potassium: 3.4 mmol/L — ABNORMAL LOW (ref 3.5–5.1)
Sodium: 137 mmol/L (ref 135–145)
Total Bilirubin: 2.1 mg/dL — ABNORMAL HIGH (ref 0.3–1.2)
Total Protein: 6.5 g/dL (ref 6.5–8.1)

## 2019-07-21 LAB — CBC
HCT: 38.8 % (ref 36.0–46.0)
Hemoglobin: 12.4 g/dL (ref 12.0–15.0)
MCH: 31.7 pg (ref 26.0–34.0)
MCHC: 32 g/dL (ref 30.0–36.0)
MCV: 99.2 fL (ref 80.0–100.0)
Platelets: 142 10*3/uL — ABNORMAL LOW (ref 150–400)
RBC: 3.91 MIL/uL (ref 3.87–5.11)
RDW: 14.3 % (ref 11.5–15.5)
WBC: 9.3 10*3/uL (ref 4.0–10.5)
nRBC: 0 % (ref 0.0–0.2)

## 2019-07-21 SURGERY — ERCP, WITH INTERVENTION IF INDICATED
Anesthesia: General

## 2019-07-21 MED ORDER — INDOMETHACIN 50 MG RE SUPP
RECTAL | Status: DC | PRN
  Administered 2019-07-21: 100 mg via RECTAL

## 2019-07-21 MED ORDER — PHENOL 1.4 % MT LIQD: 1.0000 | OROMUCOSAL | Status: DC | PRN

## 2019-07-21 MED ORDER — INDOMETHACIN 50 MG RE SUPP
RECTAL | Status: AC
  Filled 2019-07-21: qty 2

## 2019-07-21 MED ORDER — GLUCAGON HCL RDNA (DIAGNOSTIC) 1 MG IJ SOLR
INTRAMUSCULAR | Status: AC
  Filled 2019-07-21: qty 1

## 2019-07-21 MED ORDER — SUGAMMADEX SODIUM 200 MG/2ML IV SOLN
INTRAVENOUS | Status: DC | PRN
  Administered 2019-07-21: 200 mg via INTRAVENOUS

## 2019-07-21 MED ORDER — ROCURONIUM BROMIDE 10 MG/ML (PF) SYRINGE
PREFILLED_SYRINGE | INTRAVENOUS | Status: DC | PRN
  Administered 2019-07-21: 30 mg via INTRAVENOUS

## 2019-07-21 MED ORDER — FUROSEMIDE 10 MG/ML IJ SOLN
20.0000 mg | Freq: Once | INTRAMUSCULAR | Status: AC
  Administered 2019-07-21: 20 mg via INTRAVENOUS
  Filled 2019-07-21: qty 2

## 2019-07-21 MED ORDER — LIDOCAINE 2% (20 MG/ML) 5 ML SYRINGE
INTRAMUSCULAR | Status: DC | PRN
  Administered 2019-07-21: 60 mg via INTRAVENOUS

## 2019-07-21 MED ORDER — POTASSIUM CHLORIDE CRYS ER 20 MEQ PO TBCR
40.0000 meq | EXTENDED_RELEASE_TABLET | Freq: Once | ORAL | Status: AC
  Administered 2019-07-21: 40 meq via ORAL
  Filled 2019-07-21: qty 2

## 2019-07-21 MED ORDER — ONDANSETRON HCL 4 MG/2ML IJ SOLN
INTRAMUSCULAR | Status: DC | PRN
  Administered 2019-07-21: 4 mg via INTRAVENOUS

## 2019-07-21 MED ORDER — DEXAMETHASONE SODIUM PHOSPHATE 10 MG/ML IJ SOLN
INTRAMUSCULAR | Status: DC | PRN
  Administered 2019-07-21: 4 mg via INTRAVENOUS

## 2019-07-21 MED ORDER — PROPOFOL 10 MG/ML IV BOLUS
INTRAVENOUS | Status: DC | PRN
  Administered 2019-07-21: 110 mg via INTRAVENOUS

## 2019-07-21 MED ORDER — SUCCINYLCHOLINE CHLORIDE 200 MG/10ML IV SOSY
PREFILLED_SYRINGE | INTRAVENOUS | Status: DC | PRN
  Administered 2019-07-21: 100 mg via INTRAVENOUS

## 2019-07-21 MED ORDER — INDOMETHACIN 50 MG RE SUPP
100.0000 mg | Freq: Once | RECTAL | Status: DC
  Filled 2019-07-21: qty 2

## 2019-07-21 MED ORDER — LACTATED RINGERS IV SOLN: INTRAVENOUS | Status: DC

## 2019-07-21 MED ORDER — SODIUM CHLORIDE 0.9 % IV SOLN
INTRAVENOUS | Status: DC | PRN
  Administered 2019-07-21: 40 mL

## 2019-07-21 MED ORDER — LACTATED RINGERS IV SOLN: INTRAVENOUS | Status: DC | PRN

## 2019-07-21 NOTE — Op Note (Signed)
[facility] Patient Name: Beverly Kim Procedure Date: 07/21/2019 MRN: [id number] Attending MD: Milus Banister , MD Date of Birth: 05-23-1924 CSN: [id number] Age: 86 Admit Type: Inpatient Procedure:                ERCP Indications:              elevated liver tests, dilated biliary tree,                            porcelain GB, abnormal HIDA scan Providers:                Milus Banister, MD, Cleda Daub, RN, Cherylynn Ridges, Technician, Arnoldo Hooker, CRNA, Marla Roe, CRNA Referring MD:              Medicines:                General Anesthesia, on IV flagyl and ceftriaxone in                            hosp Complications:            No immediate complications. Estimated blood loss:                            None Estimated Blood Loss:     Estimated blood loss: none. Procedure:                Pre-Anesthesia Assessment:                           - Prior to the procedure, a History and Physical                            was performed, and patient medications and                            allergies were reviewed. The patient's tolerance of                            previous anesthesia was also reviewed. The risks                            and benefits of the procedure and the sedation                            options and risks were discussed with the patient.                            All questions were answered, and informed consent                            was obtained. Prior Anticoagulants: The  patient has                            taken Xarelto (rivaroxaban), last dose was 3 days                            prior to procedure. ASA Grade Assessment: IV - A                            patient with severe systemic disease that is a                            constant threat to life. After reviewing the risks                            and benefits, the patient was deemed in   satisfactory condition to undergo the procedure.                           After obtaining informed consent, the scope was                            passed under direct vision. Throughout the                            procedure, the patient's blood pressure, pulse, and                            oxygen saturations were monitored continuously. The                            [id number]) Olympus Duodenoscope was                            introduced through the mouth, and used to inject                            contrast into and used to inject contrast into the                            bile duct. The ERCP was accomplished without                            difficulty. The patient tolerated the procedure                            well. Scope In: Scope Out: Findings:      The scout film was normal. The duodenoscope was advanced to the region       of the major papilla without detailed examination of the UGI tract. The       diverticulum with sitting withing a medium sized diverticulum which did       not hinder biliary cannulation. A 44 Autotome over a .035 hydrawire was       used to cannulate the  bile duct and contrast was injected. Cholangiogram       revealed mobile filling defects within the distal CBD, largest 8-25mm       across. The cystic duct and gallbladder both partially opacified.       Numerous stones clearly visible in the gallbladder. The main bile duct       was slightly dilated proximal (CBD 12-85mm) to the stones. No obvious       strictures were seen. I performed a biliary sphincterotomy and then used       a 9-42mm biliary retrieval balloon to sweep the duct several times.       Several brown stones and copious amorphous stone debris were delivered       into the duodenum. There was no purulence. I needed to dilated the       sphincterotomy site (using retrieval balloon fully dilated at the       sphinctertomy site) in order to completely clear the duct. A  completion,       occlusion cholangriogram showed no obvious remaining filling defects.       The main pancreatic duct was never cannulated with the wire or injected       with dye. Impression:               - Choledocholithiasis was found. Complete removal                            was accomplished by biliary sphincterotomy, balloon                            dilation of the sphinctertomy site and balloon                            extraction.                           - The cystic duct and gallbladder (filled with                            obvious stones) partially opacfied. Moderate Sedation:      Not Applicable - Patient had care per Anesthesia. Recommendation:           - Return patient to hospital ward for ongoing care.                           - Would continue another 24 hours of IV antibiotics.                           - Consider elective cholecystectomy vs. clinical                            observation since the bile duct is now clear and                            she has a good sphincterotomy. Procedure Code(s):        --- Professional ---  43264, Endoscopic retrograde                            cholangiopancreatography (ERCP); with removal of                            calculi/debris from biliary/pancreatic duct(s)                           43262, Endoscopic retrograde                            cholangiopancreatography (ERCP); with                            sphincterotomy/papillotomy Diagnosis Code(s):        --- Professional ---                           K80.50, Calculus of bile duct without cholangitis                            or cholecystitis without obstruction                           R17, Unspecified jaundice                           R74.8, Abnormal levels of other serum enzymes CPT copyright 2019 American Medical Association. All rights reserved. The codes documented in this report are preliminary and upon coder review may  be  revised to meet current compliance requirements. Milus Banister, MD 07/21/2019 12:05:45 PM This report has been signed electronically. Number of Addenda: 0

## 2019-07-21 NOTE — Interval H&P Note (Signed)
History and Physical Interval Note:  07/21/2019 10:29 AM  Beverly Kim  has presented today for surgery, with the diagnosis of common bile duct obstruction.  The various methods of treatment have been discussed with the patient and family. After consideration of risks, benefits and other options for treatment, the patient has consented to  Procedure(s): ENDOSCOPIC RETROGRADE CHOLANGIOPANCREATOGRAPHY (ERCP) (N/A) as a surgical intervention.  The patient's history has been reviewed, patient examined, no change in status, stable for surgery.  I have reviewed the patient's chart and labs.  Questions were answered to the patient's satisfaction.     Milus Banister

## 2019-07-21 NOTE — Anesthesia Preprocedure Evaluation (Signed)
Anesthesia Evaluation  Patient identified by MRN, date of birth, ID band Patient awake    Reviewed: Allergy & Precautions, NPO status , Patient's Chart, lab work & pertinent test results  Airway Mallampati: II  TM Distance: >3 FB Neck ROM: Full    Dental  (+) Dental Advisory Given   Pulmonary shortness of breath, asthma ,    breath sounds clear to auscultation       Cardiovascular hypertension, Pt. on medications + CAD  + dysrhythmias Atrial Fibrillation + Valvular Problems/Murmurs MR  Rhythm:Regular Rate:Normal     Neuro/Psych negative neurological ROS     GI/Hepatic hiatal hernia, GERD  ,CBD obstruction   Endo/Other  diabetes, Type 2Hypothyroidism   Renal/GU negative Renal ROS     Musculoskeletal  (+) Arthritis ,   Abdominal   Peds  Hematology negative hematology ROS (+)   Anesthesia Other Findings   Reproductive/Obstetrics                             Anesthesia Physical Anesthesia Plan  ASA: III  Anesthesia Plan: General   Post-op Pain Management:    Induction: Intravenous  PONV Risk Score and Plan: 3 and Dexamethasone, Ondansetron and Treatment may vary due to age or medical condition  Airway Management Planned: Oral ETT  Additional Equipment: None  Intra-op Plan:   Post-operative Plan: Extubation in OR  Informed Consent: I have reviewed the patients History and Physical, chart, labs and discussed the procedure including the risks, benefits and alternatives for the proposed anesthesia with the patient or authorized representative who has indicated his/her understanding and acceptance.     Dental advisory given  Plan Discussed with: CRNA  Anesthesia Plan Comments:         Anesthesia Quick Evaluation

## 2019-07-21 NOTE — Anesthesia Postprocedure Evaluation (Signed)
Anesthesia Post Note  Patient: Beverly Kim  Procedure(s) Performed: ENDOSCOPIC RETROGRADE CHOLANGIOPANCREATOGRAPHY (ERCP) (N/A ) SPHINCTEROTOMY REMOVAL OF STONES     Patient location during evaluation: PACU Anesthesia Type: General Level of consciousness: awake and alert Pain management: pain level controlled Vital Signs Assessment: post-procedure vital signs reviewed and stable Respiratory status: spontaneous breathing, nonlabored ventilation, respiratory function stable and patient connected to nasal cannula oxygen Cardiovascular status: blood pressure returned to baseline and stable Postop Assessment: no apparent nausea or vomiting Anesthetic complications: no    Last Vitals:  Vitals:   07/21/19 1240 07/21/19 1303  BP: (!) 143/79 (!) 151/83  Pulse: 68 70  Resp: (!) 21 18  Temp:  (!) 36.3 C  SpO2: 96% 90%    Last Pain:  Vitals:   07/21/19 1327  TempSrc:   PainSc: 0-No pain                 Tiajuana Amass

## 2019-07-21 NOTE — Progress Notes (Signed)
PT Cancellation Note  Patient Details Name: Beverly Kim MRN: [id number] DOB: 1925-02-25   Cancelled Treatment:    Reason Eval/Treat Not Completed: Other (comment);Patient at procedure or test/unavailable(procedure earlier today, defer eval at this time)   [facility] 07/21/2019, 5:46 PM

## 2019-07-21 NOTE — Anesthesia Procedure Notes (Signed)
Procedure Name: Intubation Date/Time: 07/21/2019 11:24 AM Performed by: Lavina Hamman, CRNA Pre-anesthesia Checklist: Patient identified, Emergency Drugs available, Suction available, Patient being monitored and Timeout performed Patient Re-evaluated:Patient Re-evaluated prior to induction Oxygen Delivery Method: Circle system utilized Preoxygenation: Pre-oxygenation with 100% oxygen Induction Type: IV induction Ventilation: Mask ventilation without difficulty Laryngoscope Size: Mac and 3 Grade View: Grade I Tube type: Oral Tube size: 7.0 mm Number of attempts: 1 Airway Equipment and Method: Stylet Placement Confirmation: ETT inserted through vocal cords under direct vision,  positive ETCO2,  CO2 detector and breath sounds checked- equal and bilateral Secured at: 22 cm Tube secured with: Tape Dental Injury: Teeth and Oropharynx as per pre-operative assessment  Comments: Intubation by EMS student, ATOI.

## 2019-07-21 NOTE — Progress Notes (Signed)
PROGRESS NOTE  Beverly Kim [id number] DOB: [birth year] DOA: 07/19/2019 PCP: [name], MD   LOS: 2 days   Brief Narrative / Interim history: 86 year old female with history of A. fib on Xarelto, presence of pacemaker for AV block, CAD, chronic diastolic CHF, hypothyroidism, hypertension, hyperlipidemia, cholelithiasis and biliary colic came to the hospital with acute onset abdominal pain.  She has had an episode like this before and was told that she needs a cholecystectomy however per patient did not want to do that at that time.  CT scan showed gallbladder wall thickening and an ultrasound was highly suspicious for acute cholecystitis with a prominently distended CBD measuring 12 mm in diameter.  She had mild LFT elevation AST, ALT are 138, 79 as well as an elevated bilirubin of 1.5.  Lipase was normal.  Subjective / 24h Interval events: Had an episode of abdominal pain overnight, no nausea.  No shortness of breath, no chest pain  Assessment & Plan: Principal Problem Acute cholecystitis with sepsis, also possible CBD obstruction, POA-patient febrile in the ED, tachypneic, concerning for infectious source with her gallbladder.    Both general surgery and gastroenterology teams were consulted, she seems to be very high risk for surgery and is not clear what is obstructing her CBD -ERCP today per GI  Active problems Acute on chronic dyspnea-patient has had chronic dyspnea for a number of years for which she was seen by cardiology as an outpatient.  She had an episode last night of shortness of breath that felt worse than her normal episodes.  Chest x-ray was done overnight which showed possibility of early vascular congestion, will give a dose of Lasix this morning.  She has remained on room air throughout  Bacteriuria-cultures negative  Creatinine elevation-does not meet criteria for AKI, creatinine overall stable.  Monitor post Lasix  Hyponatremia-monitor  Afib on Xarelto,  complete AV block status post pacemaker-hold xarelto currently due to plan for ERCP -Continue bisoprolol 5 mg daily  HTN -stable   Acute on chronic diastolic dysfunction  -as above, slightly fluid overloaded on chest x-ray and received Lasix  CAD -no active issues   Hypothyroidism  -resume synthroid  HLD -resume home medications once tolerating po   Scheduled Meds: . [MAR Hold] bisoprolol  5 mg Oral Daily  . [MAR Hold] fluticasone furoate-vilanterol  1 puff Inhalation Daily  . [MAR Hold] furosemide  20 mg Intravenous Once  . indomethacin  100 mg Rectal Once  . [MAR Hold] levothyroxine  88 mcg Oral QAC breakfast  . [MAR Hold] potassium chloride  40 mEq Oral Once  . [MAR Hold] umeclidinium bromide  1 puff Inhalation Daily   Continuous Infusions: . [MAR Hold] sodium chloride 1,000 mL (07/21/19 0503)  . [MAR Hold] cefTRIAXone (ROCEPHIN)  IV 2 g (07/21/19 0504)  . lactated ringers    . [MAR Hold] metronidazole 500 mg (07/21/19 0538)   PRN Meds:.[MAR Hold] sodium chloride, [MAR Hold] acetaminophen **OR** [MAR Hold] acetaminophen, [MAR Hold] albuterol, [MAR Hold]  morphine injection, [MAR Hold] ondansetron **OR** [MAR Hold] ondansetron (ZOFRAN) IV  DVT prophylaxis: SCDs Code Status: Full code Family Communication: Daughter and granddaughter present at bedside  Status is: Inpatient  Remains inpatient appropriate because:Abnormal LFTs, ERCP planned for tomorrow followed by potential surgery  Dispo: The patient is from: Home              Anticipated d/c is to: Home              Anticipated  d/c date is: 3 days              Patient currently is not medically stable to d/c.  Consultants:  Gastroenterology General surgery  Procedures:  ERCP 5/18-results pending  Microbiology  None   Antimicrobials: Ceftriaxone 5/16 >> Metronidazole 5/16 >>    Objective: Vitals:   07/20/19 1955 07/21/19 0511 07/21/19 0748 07/21/19 1011  BP: 125/71 131/71  127/63  Pulse: 69 70  72    Resp: 17 16  17   Temp: 98.1 F (36.7 C) (!) 97.5 F (36.4 C)  97.8 F (36.6 C)  TempSrc: Oral Oral  Oral  SpO2: 99% 94% 95% 96%  Weight:      Height:        Intake/Output Summary (Last 24 hours) at 07/21/2019 1041 Last data filed at 07/20/2019 1500 Gross per 24 hour  Intake 200 ml  Output --  Net 200 ml   Filed Weights   07/19/19 0120 07/19/19 1659  Weight: 75.3 kg 75.8 kg    Examination:  Constitutional: No distress Eyes: No icterus ENMT: mmm Neck: normal, supple Respiratory: Clear bilaterally, no wheezing or crackles. Cardiovascular: Regular rate and rhythm, no edema, good pulses Abdomen: Bowel sounds positive, no distention, Musculoskeletal: no clubbing / cyanosis.  Skin: No rashes seen Neurologic: No focal deficits, equal strength  Data Reviewed: I have independently reviewed following labs and imaging studies   CBC: Recent Labs  Lab 07/19/19 0139 07/19/19 0635 07/20/19 0419 07/21/19 0454  WBC 8.3 10.5 9.2 9.3  NEUTROABS 7.1 9.3*  --   --   HGB 12.6 11.3* 10.8* 12.4  HCT 38.7 35.3* 34.2* 38.8  MCV 97.5 97.0 98.3 99.2  PLT 174 156 141* [id number]*   Basic Metabolic Panel: Recent Labs  Lab 07/19/19 0139 07/19/19 0635 07/20/19 0419 07/21/19 0454  NA 133* 138 142 137  K 3.7 3.1* 4.0 3.4*  CL 94* 104 105 99  CO2 28 23 30 25   GLUCOSE 158* 159* 126* 127*  BUN 31* 25* 23 23  CREATININE 1.26* 0.97 1.22* 1.19*  CALCIUM 9.0 7.6* 8.6* 8.7*   Liver Function Tests: Recent Labs  Lab 07/19/19 0139 07/19/19 0635 07/19/19 1533 07/20/19 0419 07/21/19 0454  AST 138* 226* 226* 122* 89*  ALT 79* 167* 166* 135* 114*  ALKPHOS 90 84 87 81 95  BILITOT 1.5* 2.5* 2.5* 3.6* 2.1*  PROT 7.1 5.4* 5.7* 5.8* 6.5  ALBUMIN 4.0 3.0* 3.1* 3.1* 3.5   Coagulation Profile: Recent Labs  Lab 07/19/19 0318 07/19/19 0635  INR 1.6* 1.5*   HbA1C: No results for input(s): HGBA1C in the last 72 hours. CBG: No results for input(s): GLUCAP in the last 168 hours.  Recent  Results (from the past 240 hour(s))  Blood Culture (routine x 2)     Status: None (Preliminary result)   Collection Time: 07/19/19  3:18 AM   Specimen: BLOOD  Result Value Ref Range Status   Specimen Description   Final    BLOOD LEFT ARM Performed at [facility] [address] [place], [place] [address]    Special Requests   Final    BOTTLES DRAWN AEROBIC AND ANAEROBIC Blood Culture adequate volume Performed at [facility] [address], [place], [place] [address]    Culture   Final    NO GROWTH 2 DAYS Performed at [facility] [address] [place], [place] [address]    Report Status PENDING  Incomplete  Blood Culture (routine  x 2)     Status: None (Preliminary result)   Collection Time: 07/19/19  3:23 AM   Specimen: BLOOD  Result Value Ref Range Status   Specimen Description   Final    BLOOD RIGHT WRIST Performed at [facility] [address] [place], [place] [address]    Special Requests   Final    BOTTLES DRAWN AEROBIC AND ANAEROBIC Blood Culture adequate volume Performed at [facility] [address] [place], [place] [address]    Culture   Final    NO GROWTH 2 DAYS Performed at [facility] [address], [place], [place] [address]    Report Status PENDING  Incomplete  Urine culture     Status: Abnormal   Collection Time: 07/19/19  4:30 AM   Specimen: Urine, Random  Result Value Ref Range Status   Specimen Description   Final    URINE, RANDOM Performed at [facility] [address], [place], [place] [address]    Special Requests   Final    NONE Performed at [facility], [place] [address], [place], [place] [address]    Culture MULTIPLE SPECIES PRESENT, SUGGEST RECOLLECTION (A)  Final   Report Status 07/20/2019 FINAL  Final  Urine culture     Status: Abnormal   Collection Time: 07/19/19  6:38 AM   Specimen: Urine, Random   Result Value Ref Range Status   Specimen Description   Final    URINE, RANDOM Performed at [facility] [address], [place], [place] [address]    Special Requests   Final    NONE Performed at [facility], [place] [address], [place], [place] [address]    Culture (A)  Final    <10,000 COLONIES/mL INSIGNIFICANT GROWTH Performed at [facility] [address] [place],  [address]    Report Status 07/20/2019 FINAL  Final  SARS Coronavirus 2 by RT PCR (hospital order, performed in [facility] hospital lab) Nasopharyngeal Nasopharyngeal Swab     Status: None   Collection Time: 07/19/19  3:00 PM   Specimen: Nasopharyngeal Swab  Result Value Ref Range Status   SARS Coronavirus 2 NEGATIVE NEGATIVE Final    Comment: (NOTE) SARS-CoV-2 target nucleic acids are NOT DETECTED. The SARS-CoV-2 RNA is generally detectable in upper and lower respiratory specimens during the acute phase of infection. The lowest concentration of SARS-CoV-2 viral copies this assay can detect is 250 copies / mL. A negative result does not preclude SARS-CoV-2 infection and should not be used as the sole basis for treatment or other patient management decisions.  A negative result may occur with improper specimen collection / handling, submission of specimen other than nasopharyngeal swab, presence of viral mutation(s) within the areas targeted by this assay, and inadequate number of viral copies (<250 copies / mL). A negative result must be combined with clinical observations, patient history, and epidemiological information. Fact Sheet for Patients:   StrictlyIdeas.no Fact Sheet for Healthcare Providers: BankingDealers.co.za This test is not yet approved or cleared  by the [place] FDA and has been authorized for detection and/or diagnosis of SARS-CoV-2 by FDA under an Emergency Use Authorization (EUA).  This EUA  will remain in effect (meaning this test can be used) for the duration of the COVID-19 declaration under Section 564(b)(1) of the Act, 21 U.S.C. section 360bbb-3(b)(1), unless the authorization is terminated or revoked sooner. Performed at [facility],  [place] [address] [place], [place] [address]      Radiology Studies: DG CHEST PORT 1 VIEW  Result Date: 07/20/2019 CLINICAL DATA:  Shortness of breath. EXAM: PORTABLE CHEST 1 VIEW COMPARISON:  Radiograph 02/05/2019 FINDINGS: Left-sided pacemaker in place. Lower lung volumes from prior exam. Cardiomegaly that is accentuated by low lung volumes. Interstitial prominence may reflect pulmonary edema. No focal airspace disease. Calcified granuloma in the left mid lung. No significant pleural effusion. Chronic deformity of the left proximal humerus. IMPRESSION: 1. Low lung volumes. Cardiomegaly, accentuated by low lung volumes. 2. Interstitial prominence may reflect pulmonary edema. Electronically Signed   By: Keith Rake M.D.   On: 07/20/2019 20:49    Marzetta Board, MD, PhD [facility]  Between 7 am - 7 pm I am available, please contact me via Amion or Securechat  Between 7 pm - 7 am I am not available, please contact night coverage MD/APP via Amion

## 2019-07-21 NOTE — Progress Notes (Signed)
Came by to see but is out of room for ERCP  Nadeen Landau, M.D. [facility] Use [email or web address] to contact on call provider

## 2019-07-21 NOTE — Transfer of Care (Signed)
Immediate Anesthesia Transfer of Care Note  Patient: Beverly Kim  Procedure(s) Performed: Procedure(s): ENDOSCOPIC RETROGRADE CHOLANGIOPANCREATOGRAPHY (ERCP) (N/A) SPHINCTEROTOMY REMOVAL OF STONES  Patient Location: PACU  Anesthesia Type:General  Level of Consciousness:  sedated, patient cooperative and responds to stimulation  Airway & Oxygen Therapy:Patient Spontanous Breathing and Patient connected to face mask oxgen  Post-op Assessment:  Report given to PACU RN and Post -op Vital signs reviewed and stable  Post vital signs:  Reviewed and stable  Last Vitals:  Vitals:   07/21/19 1011 07/21/19 1210  BP: 127/63 (!) 160/70  Pulse: 72 70  Resp: 17 (!) 22  Temp: 36.6 C 36.7 C  SpO2: 0000000 [id number]    Complications: No apparent anesthesia complications

## 2019-07-22 ENCOUNTER — Encounter (HOSPITAL_COMMUNITY): Payer: Self-pay | Admitting: Internal Medicine

## 2019-07-22 DIAGNOSIS — K8050 Calculus of bile duct without cholangitis or cholecystitis without obstruction: Secondary | ICD-10-CM

## 2019-07-22 LAB — COMPREHENSIVE METABOLIC PANEL
ALT: 92 U/L — ABNORMAL HIGH (ref 0–44)
AST: 65 U/L — ABNORMAL HIGH (ref 15–41)
Albumin: 3.4 g/dL — ABNORMAL LOW (ref 3.5–5.0)
Alkaline Phosphatase: 96 U/L (ref 38–126)
Anion gap: 13 (ref 5–15)
BUN: 36 mg/dL — ABNORMAL HIGH (ref 8–23)
CO2: 20 mmol/L — ABNORMAL LOW (ref 22–32)
Calcium: 8.6 mg/dL — ABNORMAL LOW (ref 8.9–10.3)
Chloride: 101 mmol/L (ref 98–111)
Creatinine, Ser: 1.4 mg/dL — ABNORMAL HIGH (ref 0.44–1.00)
GFR calc Af Amer: 38 mL/min — ABNORMAL LOW (ref >60)
GFR calc non Af Amer: 33 mL/min — ABNORMAL LOW (ref >60)
Glucose, Bld: 174 mg/dL — ABNORMAL HIGH (ref 70–99)
Potassium: 4.1 mmol/L (ref 3.5–5.1)
Sodium: 134 mmol/L — ABNORMAL LOW (ref 135–145)
Total Bilirubin: 1.1 mg/dL (ref 0.3–1.2)
Total Protein: 6.4 g/dL — ABNORMAL LOW (ref 6.5–8.1)

## 2019-07-22 LAB — CBC
HCT: 36.1 % (ref 36.0–46.0)
Hemoglobin: 11.7 g/dL — ABNORMAL LOW (ref 12.0–15.0)
MCH: 31 pg (ref 26.0–34.0)
MCHC: 32.4 g/dL (ref 30.0–36.0)
MCV: 95.5 fL (ref 80.0–100.0)
Platelets: 144 10*3/uL — ABNORMAL LOW (ref 150–400)
RBC: 3.78 MIL/uL — ABNORMAL LOW (ref 3.87–5.11)
RDW: 14.1 % (ref 11.5–15.5)
WBC: 7.9 10*3/uL (ref 4.0–10.5)
nRBC: 0 % (ref 0.0–0.2)

## 2019-07-22 NOTE — Progress Notes (Signed)
PROGRESS NOTE    Beverly Kim  [id number] DOB: 01-May-1924 DOA: 07/19/2019 PCP: [name], MD   Chef Complaints: Acute onset of abdominal pain  Brief Narrative: 86 year old female with A. fib on Xarelto, pacemaker for AV block, CAD, chronic diastolic CHF, hypothyroidism, hypertension, hyperlipidemia came to the ED with acute onset of abdominal pain.  She has history of cholelithiasis and had similar previous episodes and told she need cholecystectomy however patient did not want to do at that time.  CT scan showed gallbladder wall thickening and ultrasound was highly suspicious for acute cholecystitis with a prominently distended CBD at 12 mm with mildly elevated LFTs.  GI and surgery was consulted and patient was admitted. Patient underwent ERCP 5/18.  Subjective: Overnight no fever T-max 98.1, saturating 97% on room air, vitals are stable otherwise. Lab with slightly worsening creatinine BUN at 36/1.4, AST ALT stable/improving, total bili normal 1.1 Is hard of hearing  Assessment & Plan:  Acute cholecystitis with cholelithiasis with sepsis POA, with CBD obstruction: Patient is afebrile, no leukocytosis.  Underwent ERCP-that showed choledocholithiasis, complete stone removal was accomplished by sphincterotomy and balloon extraction, cystic duct and gallbladder filled with aggressive stones partially opacified GI advised continued IV antibiotic for the next 24 hours and surgery following inpatient versus outpatient elective cholecystectomy.  Patient seems to be high risk for surgery. Clinically improving.  Continue diet as per GI/surgery.  Acute on chronic dyspnea, chronic dyspnea for a number of years seen by cardiology as outpatient.  Episodes of shortness of breath 2 nights ago, chest x-ray at that time showed early vascular congestion and was given Lasix.  Doing well on room air and blood pressure stable.  AKI on CKD stage IIIa: BUN/creatinine baseline around 0.9-1.2, now up at  36/1.5.  Encourage oral hydration.  Held ivf previously due to congested chest x-ray and symptomatic.  Bacteriuria cultures negative  Hyponatremia mild monitor.  Acute on chronic diastolic heart failure: likley fluid overloaded on chest x-ray for which she received Lasix.  Monitor.  Hopefully we can resume her Lasix and metolazone tomorrow. A. fib on Xarelto/complete AV block status post pacemaker held Xarelto for ERCP-resume if okay w/ GI.On Bisoprolol CAD-no chest pain. Hypertension-BP controlled.  Continue home medication. Hypothyroidism continue Synthroid. Hyperlipidemia -hold stating for now.  DVT prophylaxis:SCD- held xarelto for procedure. Code Status:FULL Family Communication: plan of care discussed with patient at bedside.  Status is: Inpatient Remains inpatient appropriate because:Ongoing active pain requiring inpatient pain management and Inpatient level of care appropriate due to severity of illness  Dispo: The patient is from: Home              Anticipated d/c is to: Home              Anticipated d/c date is: 1 day              Patient currently is not medically stable to d/c.  Diet Order            Diet Heart Room service appropriate? Yes; Fluid consistency: Thin  Diet effective now              Body mass index is 30.54 kg/m.  Consultants:see note  Procedures: ERCP; 1. Cholelithiasis and choledocholithiasis. 2. ERCP with sphincterotomy and balloon sweeping of the common duct Microbiology:see note  Medications: Scheduled Meds: . bisoprolol  5 mg Oral Daily  . fluticasone furoate-vilanterol  1 puff Inhalation Daily  . indomethacin  100 mg Rectal Once  .  levothyroxine  88 mcg Oral QAC breakfast  . umeclidinium bromide  1 puff Inhalation Daily   Continuous Infusions: . sodium chloride 250 mL (07/21/19 2111)  . cefTRIAXone (ROCEPHIN)  IV 2 g (07/22/19 0503)  . metronidazole 500 mg (07/22/19 0413)    Antimicrobials: Anti-infectives (From admission,  onward)   Start     Dose/Rate Route Frequency Ordered Stop   07/20/19 0400  cefTRIAXone (ROCEPHIN) 2 g in sodium chloride 0.9 % 100 mL IVPB     2 g 200 mL/hr over 30 Minutes Intravenous Every 24 hours 07/19/19 0633     07/19/19 1200  metroNIDAZOLE (FLAGYL) IVPB 500 mg     500 mg 100 mL/hr over 60 Minutes Intravenous Every 8 hours 07/19/19 0633     07/19/19 0330  cefTRIAXone (ROCEPHIN) 2 g in sodium chloride 0.9 % 100 mL IVPB     2 g 200 mL/hr over 30 Minutes Intravenous  Once 07/19/19 0318 07/19/19 0507   07/19/19 0330  metroNIDAZOLE (FLAGYL) IVPB 500 mg     500 mg 100 mL/hr over 60 Minutes Intravenous  Once 07/19/19 0318 07/19/19 0605       Objective: Vitals: Today's Vitals   07/21/19 2111 07/21/19 2130 07/22/19 0445 07/22/19 0725  BP:  132/71 136/84   Pulse:  70 69   Resp:  20 (!) 22   Temp:  97.7 F (36.5 C) (!) 97.4 F (36.3 C)   TempSrc:  Oral Oral   SpO2:  98% 97%   Weight:      Height:      PainSc: 0-No pain   0-No pain    Intake/Output Summary (Last 24 hours) at 07/22/2019 1248 Last data filed at 07/22/2019 0900 Gross per 24 hour  Intake 600 ml  Output --  Net 600 ml   Filed Weights   07/19/19 0120 07/19/19 1659  Weight: 75.3 kg 75.8 kg   Weight change:    Intake/Output from previous day: 05/18 0701 - 05/19 0700 In: 760 [P.O.:360; I.V.:400] Out: -  Intake/Output this shift: Total I/O In: 240 [P.O.:240] Out: -   Examination:  General exam: AAO, elderly, frail, hard of hearing,weak appearing. HEENT:Oral mucosa moist, Ear/Nose WNL grossly,dentition normal. Respiratory system: bilaterally clear,no wheezing or crackles,no use of accessory muscle, non tender. Cardiovascular system: S1 & S2 +, regular, No JVD. Gastrointestinal system: Abdomen soft, Tender mid and right abdomen,ND, BS+. Nervous System:Alert, awake, moving extremities and grossly nonfocal Extremities: No edema, distal peripheral pulses palpable.  Skin: No rashes,no icterus. MSK: Normal  muscle bulk,tone, power  Data Reviewed: I have personally reviewed following labs and imaging studies CBC: Recent Labs  Lab 07/19/19 0139 07/19/19 0635 07/20/19 0419 07/21/19 0454 07/22/19 0429  WBC 8.3 10.5 9.2 9.3 7.9  NEUTROABS 7.1 9.3*  --   --   --   HGB 12.6 11.3* 10.8* 12.4 11.7*  HCT 38.7 35.3* 34.2* 38.8 36.1  MCV 97.5 97.0 98.3 99.2 95.5  PLT 174 156 141* 142* [id number]*   Basic Metabolic Panel: Recent Labs  Lab 07/19/19 0139 07/19/19 0635 07/20/19 0419 07/21/19 0454 07/22/19 0429  NA 133* 138 142 137 134*  K 3.7 3.1* 4.0 3.4* 4.1  CL 94* 104 105 99 101  CO2 28 23 30 25  20*  GLUCOSE 158* 159* 126* 127* 174*  BUN 31* 25* 23 23 36*  CREATININE 1.26* 0.97 1.22* 1.19* 1.40*  CALCIUM 9.0 7.6* 8.6* 8.7* 8.6*   GFR: Estimated Creatinine Clearance: 24.4 mL/min (A) (by C-G formula based on  SCr of 1.4 mg/dL (H)). Liver Function Tests: Recent Labs  Lab 07/19/19 0635 07/19/19 1533 07/20/19 0419 07/21/19 0454 07/22/19 0429  AST 226* 226* 122* 89* 65*  ALT 167* 166* 135* 114* 92*  ALKPHOS 84 87 81 95 96  BILITOT 2.5* 2.5* 3.6* 2.1* 1.1  PROT 5.4* 5.7* 5.8* 6.5 6.4*  ALBUMIN 3.0* 3.1* 3.1* 3.5 3.4*   Recent Labs  Lab 07/19/19 0139  LIPASE 27   No results for input(s): AMMONIA in the last 168 hours. Coagulation Profile: Recent Labs  Lab 07/19/19 0318 07/19/19 0635  INR 1.6* 1.5*   Cardiac Enzymes: No results for input(s): CKTOTAL, CKMB, CKMBINDEX, TROPONINI in the last 168 hours. BNP (last 3 results) Recent Labs    08/06/18 1428  PROBNP 356.0*   HbA1C: No results for input(s): HGBA1C in the last 72 hours. CBG: No results for input(s): GLUCAP in the last 168 hours. Lipid Profile: No results for input(s): CHOL, HDL, LDLCALC, TRIG, CHOLHDL, LDLDIRECT in the last 72 hours. Thyroid Function Tests: No results for input(s): TSH, T4TOTAL, FREET4, T3FREE, THYROIDAB in the last 72 hours. Anemia Panel: No results for input(s): VITAMINB12, FOLATE, FERRITIN,  TIBC, IRON, RETICCTPCT in the last 72 hours. Sepsis Labs: Recent Labs  Lab 07/19/19 0318 07/19/19 0445 07/19/19 0835  LATICACIDVEN 4.7* 4.0* 3.0*    Recent Results (from the past 240 hour(s))  Blood Culture (routine x 2)     Status: None (Preliminary result)   Collection Time: 07/19/19  3:18 AM   Specimen: BLOOD  Result Value Ref Range Status   Specimen Description   Final    BLOOD LEFT ARM Performed at [facility] [address], [place], [place] [address]    Special Requests   Final    BOTTLES DRAWN AEROBIC AND ANAEROBIC Blood Culture adequate volume Performed at [facility] [address], [place], [place] [address]    Culture   Final    NO GROWTH 3 DAYS Performed at [facility], [place] [address], [place], [place] [address]    Report Status PENDING  Incomplete  Blood Culture (routine x 2)     Status: None (Preliminary result)   Collection Time: 07/19/19  3:23 AM   Specimen: BLOOD  Result Value Ref Range Status   Specimen Description   Final    BLOOD RIGHT WRIST Performed at [facility] [address], [place], [place] [address]    Special Requests   Final    BOTTLES DRAWN AEROBIC AND ANAEROBIC Blood Culture adequate volume Performed at [facility] [address], [place], [place] [address]    Culture   Final    NO GROWTH 3 DAYS Performed at [facility], [place] [address], [place], [place] [address]    Report Status PENDING  Incomplete  Urine culture     Status: Abnormal   Collection Time: 07/19/19  4:30 AM   Specimen: Urine, Random  Result Value Ref Range Status   Specimen Description   Final    URINE, RANDOM Performed at [facility] [address] [place], [place] [address]    Special Requests   Final    NONE Performed at [facility], [place] [address], [place], [place] [address]    Culture MULTIPLE SPECIES PRESENT, SUGGEST  RECOLLECTION (A)  Final   Report Status 07/20/2019 FINAL  Final  Urine culture     Status: Abnormal   Collection Time: 07/19/19  6:38 AM   Specimen: Urine, Random  Result Value Ref Range Status   Specimen Description   Final    URINE, RANDOM Performed at [facility] [address] [place], [place], [place] [address]    Special Requests   Final    NONE Performed at [facility], [place] [address] [place], [place] [address]    Culture (A)  Final    <10,000 COLONIES/mL INSIGNIFICANT GROWTH Performed at [facility] [address] [place], [place] [address]    Report Status 07/20/2019 FINAL  Final  SARS Coronavirus 2 by RT PCR (hospital order, performed in [facility] hospital lab) Nasopharyngeal Nasopharyngeal Swab     Status: None   Collection Time: 07/19/19  3:00 PM   Specimen: Nasopharyngeal Swab  Result Value Ref Range Status   SARS Coronavirus 2 NEGATIVE NEGATIVE Final    Comment: (NOTE) SARS-CoV-2 target nucleic acids are NOT DETECTED. The SARS-CoV-2 RNA is generally detectable in upper and lower respiratory specimens during the acute phase of infection. The lowest concentration of SARS-CoV-2 viral copies this assay can detect is 250 copies / mL. A negative result does not preclude SARS-CoV-2 infection and should not be used as the sole basis for treatment or other patient management decisions.  A negative result may occur with improper specimen collection / handling, submission of specimen other than nasopharyngeal swab, presence of viral mutation(s) within the areas targeted by this assay, and inadequate number of viral copies (<250 copies / mL). A negative result must be combined with clinical observations, patient history, and epidemiological information. Fact Sheet for Patients:   StrictlyIdeas.no Fact Sheet for Healthcare Providers: BankingDealers.co.za This test is not yet approved  or cleared  by the [place] FDA and has been authorized for detection and/or diagnosis of SARS-CoV-2 by FDA under an Emergency Use Authorization (EUA).  This EUA will remain in effect (meaning this test can be used) for the duration of the COVID-19 declaration under Section 564(b)(1) of the Act, 21 U.S.C. section 360bbb-3(b)(1), unless the authorization is terminated or revoked sooner. Performed at [facility], [place] [address] [place], [place] [address]       Radiology Studies: DG CHEST PORT 1 VIEW  Result Date: 07/20/2019 CLINICAL DATA:  Shortness of breath. EXAM: PORTABLE CHEST 1 VIEW COMPARISON:  Radiograph 02/05/2019 FINDINGS: Left-sided pacemaker in place. Lower lung volumes from prior exam. Cardiomegaly that is accentuated by low lung volumes. Interstitial prominence may reflect pulmonary edema. No focal airspace disease. Calcified granuloma in the left mid lung. No significant pleural effusion. Chronic deformity of the left proximal humerus. IMPRESSION: 1. Low lung volumes. Cardiomegaly, accentuated by low lung volumes. 2. Interstitial prominence may reflect pulmonary edema. Electronically Signed   By: Keith Rake M.D.   On: 07/20/2019 20:49   DG ERCP BILIARY & PANCREATIC DUCTS  Result Date: 07/21/2019 CLINICAL DATA:  86 year old female with choledocholithiasis. EXAM: ERCP TECHNIQUE: Multiple spot images obtained with the fluoroscopic device and submitted for interpretation post-procedure. FLUOROSCOPY TIME:  Fluoroscopy Time:  6 minutes 8 seconds COMPARISON:  CT abdomen/pelvis 07/19/2019 FINDINGS: Nine intraoperative saved images and a cine fluoro save are submitted for review. The images demonstrate a flexible duodenal scope in the descending duodenum with wire cannulation of the right intrahepatic ducts. Cholangiogram demonstrates multiple faceted filling defects in the mid and distal common bile duct consistent with choledocholithiasis. There is mild  dilatation of the common bile duct. The cystic duct is patent. Partial opacification of  the gallbladder is evident. There are numerous filling defects within the gallbladder lumen consistent with cholelithiasis. Subsequent images document sphincterotomy and balloon sweeping of the common duct. IMPRESSION: 1. Cholelithiasis and choledocholithiasis. 2. ERCP with sphincterotomy and balloon sweeping of the common duct. These images were submitted for radiologic interpretation only. Please see the procedural report for the amount of contrast and the fluoroscopy time utilized. Electronically Signed   By: Jacqulynn Cadet M.D.   On: 07/21/2019 12:19     LOS: 3 days   Antonieta Pert, MD [facility]  07/22/2019, 12:48 PM

## 2019-07-22 NOTE — Care Management Important Message (Signed)
Important Message  Patient Details IM Letter given to Dessa Phi RN Case Manager to present to the Patient Name: Beverly Kim MRN: [id number] Date of Birth: May 07, 1924   Medicare Important Message Given:  Yes     Kerin Salen 07/22/2019, 11:31 AM

## 2019-07-22 NOTE — Progress Notes (Addendum)
Progress Note  CC:    Abdominal pain      ASSESSMENT AND PLAN:   86 year-old female with PMH significant for atrial fibrillation chronic anticoagulation, history of PPM, CHF, PAD  # cholecystitis / choledocholithiasis --s/p ERCP with sphincterotomy and stone extraction yesterday.  --LFTs better , bilirubin now normal.  --She is still having mid to upper abdominal pain --Awaiting CCS to see. Elective cholecystectomy vrs just monitor her now that bile duct is clear.  --Can d/c antibiotics later today from GI standpoint ( unless CCS wants to continue) --I had a long discussion with patient about ERCP findings. She had multiple questions.    SUBJECTIVE   Up most of night with mid abdominal pain.   OBJECTIVE:     Vital signs in last 24 hours: Temp:  [97.4 F (36.3 C)-98.1 F (36.7 C)] 97.4 F (36.3 C) (05/19 0445) Pulse Rate:  [68-72] 69 (05/19 0445) Resp:  [16-22] 22 (05/19 0445) BP: (127-160)/(63-84) 136/84 (05/19 0445) SpO2:  [90 %-100 %] 97 % (05/19 0445) Last BM Date: 07/22/19 General:   Alert, in NAD Heart:  Regular rate and rhythm.  No lower extremity edema   Pulm: Normal respiratory effort   Abdomen:  Soft, mild diffuse mid / upper abdominal tenderness, nondistended.  Normal bowel sounds.          Neurologic:  Alert and  oriented,  grossly normal neurologically. Psych:  Pleasant, cooperative.  Normal mood and affect.   Intake/Output from previous day: 05/18 0701 - 05/19 0700 In: 760 [P.O.:360; I.V.:400] Out: -  Intake/Output this shift: No intake/output data recorded.  Lab Results: Recent Labs    07/20/19 0419 07/21/19 0454 07/22/19 0429  WBC 9.2 9.3 7.9  HGB 10.8* 12.4 11.7*  HCT 34.2* 38.8 36.1  PLT 141* 142* 144*   BMET Recent Labs    07/20/19 0419 07/21/19 0454 07/22/19 0429  NA 142 137 134*  K 4.0 3.4* 4.1  CL 105 99 101  CO2 30 25 20*  GLUCOSE 126* 127* 174*  BUN 23 23 36*  CREATININE 1.22* 1.19* 1.40*  CALCIUM 8.6* 8.7* 8.6*    LFT Recent Labs    07/19/19 1533 07/20/19 0419 07/22/19 0429  PROT 5.7*   < > 6.4*  ALBUMIN 3.1*   < > 3.4*  AST 226*   < > 65*  ALT 166*   < > 92*  ALKPHOS 87   < > 96  BILITOT 2.5*   < > 1.1  BILIDIR 1.2*  --   --   IBILI 1.3*  --   --    < > = values in this interval not displayed.   PT/INR No results for input(s): LABPROT, INR in the last 72 hours. Hepatitis Panel No results for input(s): HEPBSAG, HCVAB, HEPAIGM, HEPBIGM in the last 72 hours.  DG CHEST PORT 1 VIEW  Result Date: 07/20/2019 CLINICAL DATA:  Shortness of breath. EXAM: PORTABLE CHEST 1 VIEW COMPARISON:  Radiograph 02/05/2019 FINDINGS: Left-sided pacemaker in place. Lower lung volumes from prior exam. Cardiomegaly that is accentuated by low lung volumes. Interstitial prominence may reflect pulmonary edema. No focal airspace disease. Calcified granuloma in the left mid lung. No significant pleural effusion. Chronic deformity of the left proximal humerus. IMPRESSION: 1. Low lung volumes. Cardiomegaly, accentuated by low lung volumes. 2. Interstitial prominence may reflect pulmonary edema. Electronically Signed   By: Keith Rake M.D.   On: 07/20/2019 20:49   DG ERCP BILIARY & PANCREATIC DUCTS  Result Date: 07/21/2019 CLINICAL DATA:  86 year old female with choledocholithiasis. EXAM: ERCP TECHNIQUE: Multiple spot images obtained with the fluoroscopic device and submitted for interpretation post-procedure. FLUOROSCOPY TIME:  Fluoroscopy Time:  6 minutes 8 seconds COMPARISON:  CT abdomen/pelvis 07/19/2019 FINDINGS: Nine intraoperative saved images and a cine fluoro save are submitted for review. The images demonstrate a flexible duodenal scope in the descending duodenum with wire cannulation of the right intrahepatic ducts. Cholangiogram demonstrates multiple faceted filling defects in the mid and distal common bile duct consistent with choledocholithiasis. There is mild dilatation of the common bile duct. The cystic duct  is patent. Partial opacification of the gallbladder is evident. There are numerous filling defects within the gallbladder lumen consistent with cholelithiasis. Subsequent images document sphincterotomy and balloon sweeping of the common duct. IMPRESSION: 1. Cholelithiasis and choledocholithiasis. 2. ERCP with sphincterotomy and balloon sweeping of the common duct. These images were submitted for radiologic interpretation only. Please see the procedural report for the amount of contrast and the fluoroscopy time utilized. Electronically Signed   By: Jacqulynn Cadet M.D.   On: 07/21/2019 12:19      Active Problems:   Chronic diastolic heart failure ([place])   Acute cholecystitis   Pre-op evaluation   Abdominal pain     LOS: 3 days   Beverly Kim ,NP 07/22/2019, 9:52 AM   I have discussed the case with the PA, and that is the plan I formulated. I personally interviewed and examined the patient.  I saw this patient when she first came in over the weekend, and she is certainly improved clinically from that point.  I discussed the case with Dr. Ardis Hughs, and I am aware of the ERCP findings yesterday.  Fortunately, he proceeded with ERCP and removed large stones from the bile duct.  Only a small sphincterotomy was feasible due to the periampullary diverticulum.  Cholangiogram showed patency of the cystic duct.  WBC has normalized, LFTs improving.  She still has significant right upper quadrant tenderness, and I suspect there is still some degree of cholecystitis resolving. As such, in contrast to the above assessment, I recommend a total 7-day course of antibiotics, especially at her age.  It can be changed over to oral antibiotics such as ciprofloxacin at the time of discharge. If she continues to do well tomorrow, she can most likely be discharged home tomorrow from a GI perspective.  The [facility] physician asked for our recommendation on when the Xarelto can be resumed.  Risk of bleeding from a  sphincterotomy is high in the first several days after procedure, whereas risk of atrial thrombus and CVA in the setting of A. fib while off anticoagulation several days is low.  Therefore, I recommend that her Xarelto can be resumed 4 days from now.    Nelida Meuse III Office: [phone]

## 2019-07-22 NOTE — Progress Notes (Signed)
[facility] Progress Note  1 Day Post-Op  Subjective: Patient reports some soreness in epigastrium. Denies nausea. Discussed GI procedure yesterday and that this should hopefully keep her from needing surgery to remove her gallbladder.   Objective: Vital signs in last 24 hours: Temp:  [97.4 F (36.3 C)-98.1 F (36.7 C)] 97.4 F (36.3 C) (05/19 0445) Pulse Rate:  [68-70] 69 (05/19 0445) Resp:  [16-22] 22 (05/19 0445) BP: (132-160)/(70-84) 136/84 (05/19 0445) SpO2:  [90 %-100 %] 97 % (05/19 0445) Last BM Date: 07/22/19  Intake/Output from previous day: 05/18 0701 - 05/19 0700 In: 760 [P.O.:360; I.V.:400] Out: -  Intake/Output this shift: Total I/O In: 240 [P.O.:240] Out: -   PE: General: pleasant, WD, WN white female who is laying in bed in NAD, very hard of hearing  HEENT:  Sclera are anicteric.  PERRL.  Ears and nose without any masses or lesions.  Mouth is pink and moist Heart: regular, rate, and rhythm.  Normal s1,s2. No obvious murmurs, gallops, or rubs noted.  Palpable radial and pedal pulses bilaterally Lungs: CTAB, no wheezes, rhonchi, or rales noted.  Respiratory effort nonlabored Abd: soft, mild ttp in epigastrium, ND, +BS, no masses, hernias, or organomegaly MS: all 4 extremities are symmetrical with no cyanosis, clubbing, or edema. Skin: warm and dry with no masses, lesions, or rashes Neuro: Cranial nerves 2-12 grossly intact, sensation grossly intact  Psych: A&Ox3 with an appropriate affect.   Lab Results:  Recent Labs    07/21/19 0454 07/22/19 0429  WBC 9.3 7.9  HGB 12.4 11.7*  HCT 38.8 36.1  PLT 142* 144*   BMET Recent Labs    07/21/19 0454 07/22/19 0429  NA 137 134*  K 3.4* 4.1  CL 99 101  CO2 25 20*  GLUCOSE 127* 174*  BUN 23 36*  CREATININE 1.19* 1.40*  CALCIUM 8.7* 8.6*   PT/INR No results for input(s): LABPROT, INR in the last 72 hours. CMP     Component Value Date/Time   NA 134 (L) 07/22/2019 0429   NA 142  06/26/2017 1218   K 4.1 07/22/2019 0429   CL 101 07/22/2019 0429   CO2 20 (L) 07/22/2019 0429   GLUCOSE 174 (H) 07/22/2019 0429   BUN 36 (H) 07/22/2019 0429   BUN 18 06/26/2017 1218   CREATININE 1.40 (H) 07/22/2019 0429   CALCIUM 8.6 (L) 07/22/2019 0429   PROT 6.4 (L) 07/22/2019 0429   ALBUMIN 3.4 (L) 07/22/2019 0429   AST 65 (H) 07/22/2019 0429   ALT 92 (H) 07/22/2019 0429   ALKPHOS 96 07/22/2019 0429   BILITOT 1.1 07/22/2019 0429   GFRNONAA 33 (L) 07/22/2019 0429   GFRAA 38 (L) 07/22/2019 0429   Lipase     Component Value Date/Time   LIPASE 27 07/19/2019 0139       Studies/Results: DG CHEST PORT 1 VIEW  Result Date: 07/20/2019 CLINICAL DATA:  Shortness of breath. EXAM: PORTABLE CHEST 1 VIEW COMPARISON:  Radiograph 02/05/2019 FINDINGS: Left-sided pacemaker in place. Lower lung volumes from prior exam. Cardiomegaly that is accentuated by low lung volumes. Interstitial prominence may reflect pulmonary edema. No focal airspace disease. Calcified granuloma in the left mid lung. No significant pleural effusion. Chronic deformity of the left proximal humerus. IMPRESSION: 1. Low lung volumes. Cardiomegaly, accentuated by low lung volumes. 2. Interstitial prominence may reflect pulmonary edema. Electronically Signed   By: Keith Rake M.D.   On: 07/20/2019 20:49   DG ERCP BILIARY & PANCREATIC DUCTS  Result  Date: 07/21/2019 CLINICAL DATA:  86 year old female with choledocholithiasis. EXAM: ERCP TECHNIQUE: Multiple spot images obtained with the fluoroscopic device and submitted for interpretation post-procedure. FLUOROSCOPY TIME:  Fluoroscopy Time:  6 minutes 8 seconds COMPARISON:  CT abdomen/pelvis 07/19/2019 FINDINGS: Nine intraoperative saved images and a cine fluoro save are submitted for review. The images demonstrate a flexible duodenal scope in the descending duodenum with wire cannulation of the right intrahepatic ducts. Cholangiogram demonstrates multiple faceted filling defects  in the mid and distal common bile duct consistent with choledocholithiasis. There is mild dilatation of the common bile duct. The cystic duct is patent. Partial opacification of the gallbladder is evident. There are numerous filling defects within the gallbladder lumen consistent with cholelithiasis. Subsequent images document sphincterotomy and balloon sweeping of the common duct. IMPRESSION: 1. Cholelithiasis and choledocholithiasis. 2. ERCP with sphincterotomy and balloon sweeping of the common duct. These images were submitted for radiologic interpretation only. Please see the procedural report for the amount of contrast and the fluoroscopy time utilized. Electronically Signed   By: Jacqulynn Cadet M.D.   On: 07/21/2019 12:19    Anti-infectives: Anti-infectives (From admission, onward)   Start     Dose/Rate Route Frequency Ordered Stop   07/20/19 0400  cefTRIAXone (ROCEPHIN) 2 g in sodium chloride 0.9 % 100 mL IVPB     2 g 200 mL/hr over 30 Minutes Intravenous Every 24 hours 07/19/19 0633     07/19/19 1200  metroNIDAZOLE (FLAGYL) IVPB 500 mg     500 mg 100 mL/hr over 60 Minutes Intravenous Every 8 hours 07/19/19 0633     07/19/19 0330  cefTRIAXone (ROCEPHIN) 2 g in sodium chloride 0.9 % 100 mL IVPB     2 g 200 mL/hr over 30 Minutes Intravenous  Once 07/19/19 0318 07/19/19 0507   07/19/19 0330  metroNIDAZOLE (FLAGYL) IVPB 500 mg     500 mg 100 mL/hr over 60 Minutes Intravenous  Once 07/19/19 0318 07/19/19 [id number]       Assessment/Plan Hypokalemia - resolved CAD with LBBB Chronic diastolic heart failure Asthma DM Atrial fibrillation  Choledocholithiasis  S/p ERCP with sphincterotomy 5/18 Dr. Ardis Hughs - LFTs and Tbili improving  - Appreciate GI assistance  - patent cystic duct seen on ERCP yesterday and improving WBC argue against cholecystitis  - would not recommend elective cholecystectomy at this time, although if patient has recurrent symptoms or choledocholithiasis could  consider elective cholecystectomy vs repeat ERCP - recommend low fat diet and can also consider ursodiol to try to dissolve stones - no further recommendations from a surgery standpoint at this time, we will sign off. Please call if we can be of further assistance.   FEN - HH diet VTE - SCDs, ok to resume anticoagulation  ID - Rocephin/Flagyl - ok to stop abx from a surgical perspective   LOS: 3 days    Norm Parcel , [facility] 07/22/2019, 10:40 AM Please see Amion for pager number during day hours 7:00am-4:30pm

## 2019-07-22 NOTE — Progress Notes (Signed)
PT Cancellation Note  Patient Details Name: Beverly Kim MRN: [id number] DOB: May 15, 1924   Cancelled Treatment:    Reason Eval/Treat Not Completed: Patient at procedure or test/unavailable. IV tream in, per Rn, patient has ambulated and been in recliner x 2 hours. Will check back tomorrow.   Claretha Cooper 07/22/2019, 3:32 PM  [place] Pager [phone] Office [phone]

## 2019-07-23 ENCOUNTER — Inpatient Hospital Stay (HOSPITAL_COMMUNITY): Payer: PPO

## 2019-07-23 DIAGNOSIS — R1084 Generalized abdominal pain: Secondary | ICD-10-CM

## 2019-07-23 LAB — COMPREHENSIVE METABOLIC PANEL
ALT: 78 U/L — ABNORMAL HIGH (ref 0–44)
AST: 49 U/L — ABNORMAL HIGH (ref 15–41)
Albumin: 3.4 g/dL — ABNORMAL LOW (ref 3.5–5.0)
Alkaline Phosphatase: 89 U/L (ref 38–126)
Anion gap: 10 (ref 5–15)
BUN: 34 mg/dL — ABNORMAL HIGH (ref 8–23)
CO2: 23 mmol/L (ref 22–32)
Calcium: 8.5 mg/dL — ABNORMAL LOW (ref 8.9–10.3)
Chloride: 101 mmol/L (ref 98–111)
Creatinine, Ser: 1.29 mg/dL — ABNORMAL HIGH (ref 0.44–1.00)
GFR calc Af Amer: 42 mL/min — ABNORMAL LOW (ref >60)
GFR calc non Af Amer: 36 mL/min — ABNORMAL LOW (ref >60)
Glucose, Bld: 131 mg/dL — ABNORMAL HIGH (ref 70–99)
Potassium: 3.6 mmol/L (ref 3.5–5.1)
Sodium: 134 mmol/L — ABNORMAL LOW (ref 135–145)
Total Bilirubin: 0.9 mg/dL (ref 0.3–1.2)
Total Protein: 6.2 g/dL — ABNORMAL LOW (ref 6.5–8.1)

## 2019-07-23 LAB — HEMOGLOBIN AND HEMATOCRIT, BLOOD
HCT: 39.1 % (ref 36.0–46.0)
Hemoglobin: 12.5 g/dL (ref 12.0–15.0)

## 2019-07-23 MED ORDER — METRONIDAZOLE 500 MG PO TABS
500.0000 mg | ORAL_TABLET | Freq: Three times a day (TID) | ORAL | 0 refills | Status: AC
Start: 2019-07-23 — End: 2019-07-26

## 2019-07-23 MED ORDER — URSODIOL 300 MG PO CAPS: 300.0000 mg | ORAL_CAPSULE | Freq: Two times a day (BID) | ORAL | 0 refills | Status: DC

## 2019-07-23 MED ORDER — FUROSEMIDE 40 MG PO TABS: 40.0000 mg | ORAL_TABLET | Freq: Every day | ORAL | Status: DC

## 2019-07-23 MED ORDER — RIVAROXABAN 15 MG PO TABS: 15.0000 mg | ORAL_TABLET | Freq: Every day | ORAL | Status: DC

## 2019-07-23 MED ORDER — CIPROFLOXACIN HCL 250 MG PO TABS
250.0000 mg | ORAL_TABLET | Freq: Two times a day (BID) | ORAL | 0 refills | Status: AC
Start: 2019-07-23 — End: 2019-07-26

## 2019-07-23 MED ORDER — URSODIOL 300 MG PO CAPS
300.0000 mg | ORAL_CAPSULE | Freq: Two times a day (BID) | ORAL | Status: DC
  Administered 2019-07-23: 300 mg via ORAL
  Filled 2019-07-23 (×2): qty 1

## 2019-07-23 NOTE — Discharge Summary (Signed)
Physician Discharge Summary  Beverly Kim [id number] DOB: 03-Jun-1924 DOA: 07/19/2019  PCP: [name], MD  Admit date: 07/19/2019 Discharge date: 07/23/2019  Admitted From: home Disposition:  [place]  Recommendations for Outpatient Follow-up:  1. Follow up with PCP in 1-2 weeks 2. Please obtain BMP/CBC in one week 3. Please follow up on the following pending results:  Home Health:YES  Equipment/Devices: HHPT  Discharge Condition: Stable Code Status:full Diet recommendation:  Diet Order            Diet - low sodium heart healthy        Diet Heart Room service appropriate? Yes; Fluid consistency: Thin  Diet effective now               Brief/Interim Summary: 86 year old female with A. fib on Xarelto, pacemaker for AV block, CAD, chronic diastolic CHF, hypothyroidism, hypertension, hyperlipidemia came to the ED with acute onset of abdominal pain.  She has history of cholelithiasis and had similar previous episodes and told she need cholecystectomy however patient did not want to do at that time.  CT scan showed gallbladder wall thickening and ultrasound was highly suspicious for acute cholecystitis with a prominently distended CBD at 12 mm with mildly elevated LFTs.  GI and surgery was consulted and patient was admitted. Patient underwent ERCP 5/18. Postop patient did well tolerating diet.  Seen by surgery no plan for operative intervention advised versatile and outpatient follow-up.  Discharge the patient okay for discharge home.  Once PT clears will discharge home will likely need home health PT.  Resume Xarelto on 5/23.  Discharge Diagnoses:  Choledocholithiasis/?  Cholecystitis: with sepsis POA, with CBD obstruction: Clinically improved.  Afebrile.  Underwent ERCP-that showed choledocholithiasis, complete stone removal was accomplished by sphincterotomy and balloon extraction, cystic duct and gallbladder filled with aggressive stones partially opacified GI advised continued IV  antibiotic for the next 24 hours.  tolerating diet.  Okay for discharge with outpatient follow-up with GI and surgery.  Remains high risk for surgery.  Discussed with GI recommending antibiotics to total 7 days so we will keep her on 3 more days of Cipro and Flagyl.  Acute on chronic dyspnea, chronic dyspnea for a number of years seen by cardiology as outpatient.  Episodes of shortness of breath 2 nights ago, chest x-ray at that time showed early vascular congestion and was given Lasix.   Patient complains some dyspnea but granddaughter at the bedside endorses chronic symptoms and also some anxiety issues.  We did chest x-ray and unremarkable this morning.  Resume her home Lasix upon discharge metolazone as well.  AKI on CKD stage IIIa: BUN/creatinine baseline around 0.9-1.2, now up at 36/1.5.  Rating improved to 1.2.  Stable, follow-up with BMP in a week from PCP.  Bacteriuria cultures negative  Hyponatremia- stable  Acute on chronic diastolic heart failure: likley fluid overloaded on chest x-ray for which she received Lasix.  Monitor.   Chest x-ray today unremarkable.  Resume Lasix. A. fib on Xarelto/complete AV block status post pacemaker held Xarelto for ERCP-resume if okay w/ GI.On Bisoprolol CAD-no chest pain. Hypertension-BP controlled.  Continue home medication. Hypothyroidism continue Synthroid. Hyperlipidemia - cont statins  Consults:  Gi, surgery  Subjective: No new complaints resting comfortably. Tolerating diet. Some dyspnea but chronic. Discharge Exam: Vitals:   07/23/19 0742 07/23/19 1337  BP:  139/75  Pulse: 71 71  Resp: 18 16  Temp:  97.6 F (36.4 C)  SpO2: 95% 99%   General: Pt is  alert, awake, not in acute distress Cardiovascular: RRR, S1/S2 +, no rubs, no gallops Respiratory: CTA bilaterally, no wheezing, no rhonchi Abdominal: Soft, NT, ND, bowel sounds + Extremities: no edema, no cyanosis  Discharge Instructions  Discharge Instructions    Diet -  low sodium heart healthy   Complete by: As directed    Discharge instructions   Complete by: As directed    Please call call MD or return to ER for similar or worsening recurring problem that brought you to hospital or if any fever,nausea/vomiting,abdominal pain, uncontrolled pain, chest pain,  shortness of breath or any other alarming symptoms.  Please follow-up your doctor as instructed in a week time and obtain your liver function test and kidney function test.   Please call the office for appointment.  Please avoid alcohol, smoking, or any other illicit substance and maintain healthy habits including taking your regular medications as prescribed.  You were cared for by a hospitalist during your hospital stay. If you have any questions about your discharge medications or the care you received while you were in the hospital after you are discharged, you can call the unit and ask to speak with the hospitalist on call if the hospitalist that took care of you is not available.  Once you are discharged, your primary care physician will handle any further medical issues. Please note that NO REFILLS for any discharge medications will be authorized once you are discharged, as it is imperative that you return to your primary care physician (or establish a relationship with a primary care physician if you do not have one) for your aftercare needs so that they can reassess your need for medications and monitor your lab values   Increase activity slowly   Complete by: As directed      Allergies as of 07/23/2019      Reactions   Adhesive [tape] Itching, Dermatitis, Rash, Other (See Comments)   Blisters and "skin bubbles"   Ace Inhibitors Cough   Atorvastatin Other (See Comments)   REACTION: Reaction not known   Clindamycin Other (See Comments)   Unknown   Codeine Other (See Comments)   hallucinations    Latex Other (See Comments)   blisters   Shellfish Allergy Other (See Comments)   "gallbladder  attack"   Simvastatin Other (See Comments)   REACTION: fatigue   Penicillins Rash   Has patient had a PCN reaction causing immediate rash, facial/tongue/throat swelling, SOB or lightheadedness with hypotension: Unknown Has patient had a PCN reaction causing severe rash involving mucus membranes or skin necrosis: Unknown Has patient had a PCN reaction that required hospitalization: Unknown Has patient had a PCN reaction occurring within the last 10 years: No If all of the above answers are "NO", then may proceed with Cephalosporin use.      Medication List    TAKE these medications   albuterol 108 (90 Base) MCG/ACT inhaler Commonly known as: VENTOLIN HFA Inhale 1-2 puffs into the lungs every 6 (six) hours as needed for wheezing or shortness of breath.   BIOTIN PO Take 1 tablet by mouth daily.   bisoprolol 5 MG tablet Commonly known as: ZEBETA Take 1 tablet by mouth once daily   calcium carbonate 500 MG chewable tablet Commonly known as: TUMS - dosed in mg elemental calcium Chew 1 tablet by mouth daily as needed for indigestion or heartburn.   CALCIUM/VITAMIN D PO Take 1 tablet by mouth daily.   CENTRUM SILVER PO Take 1 tablet by mouth  daily.   cholecalciferol 25 MCG (1000 UNIT) tablet Commonly known as: VITAMIN D3 Take 1,000 Units by mouth daily.   ciprofloxacin 250 MG tablet Commonly known as: Cipro Take 1 tablet (250 mg total) by mouth 2 (two) times daily for 3 days.   furosemide 40 MG tablet Commonly known as: LASIX Take 1 tablet (40 mg total) by mouth daily.   metolazone 2.5 MG tablet Commonly known as: ZAROXOLYN TAKE ONE TABLET BY MOUTH THREE TIMES A WEEK What changed:   how much to take  how to take this  when to take this  additional instructions   metroNIDAZOLE 500 MG tablet Commonly known as: Flagyl Take 1 tablet (500 mg total) by mouth 3 (three) times daily for 3 days.   potassium chloride 10 MEQ tablet Commonly known as: KLOR-CON Take 1  tablet by mouth on days metolazone are taken What changed:   how much to take  how to take this  when to take this   Rivaroxaban 15 MG Tabs tablet Commonly known as: Xarelto Take 1 tablet (15 mg total) by mouth daily. Start taking on: Jul 26, 2019 What changed:   See the new instructions.  These instructions start on Jul 26, 2019. If you are unsure what to do until then, ask your doctor or other care provider.   Synthroid 88 MCG tablet Generic drug: levothyroxine TAKE 1 TABLET BY MOUTH ONCE DAILY BEFORE BREAKFAST What changed: See the new instructions.   Trelegy Ellipta 100-62.5-25 MCG/INH Aepb Generic drug: Fluticasone-Umeclidin-Vilant Inhale 1 puff into the lungs daily.   ursodiol 300 MG capsule Commonly known as: ACTIGALL Take 1 capsule (300 mg total) by mouth 2 (two) times daily.   vitamin C 500 MG tablet Commonly known as: ASCORBIC ACID Take 500 mg by mouth daily.      Follow-up Information    [name], MD Follow up in 1 week(s).   Specialty: Internal Medicine Contact information: [place] [address] [phone]        Minus Breeding, MD .   Specialty: Cardiology Contact information: [address] [place] [address] [phone]          Allergies  Allergen Reactions  . Adhesive [Tape] Itching, Dermatitis, Rash and Other (See Comments)    Blisters and "skin bubbles"  . Ace Inhibitors Cough  . Atorvastatin Other (See Comments)    REACTION: Reaction not known  . Clindamycin Other (See Comments)    Unknown  . Codeine Other (See Comments)    hallucinations   . Latex Other (See Comments)    blisters  . Shellfish Allergy Other (See Comments)    "gallbladder attack"  . Simvastatin Other (See Comments)    REACTION: fatigue  . Penicillins Rash    Has patient had a PCN reaction causing immediate rash, facial/tongue/throat swelling, SOB or lightheadedness with hypotension: Unknown Has patient had a  PCN reaction causing severe rash involving mucus membranes or skin necrosis: Unknown Has patient had a PCN reaction that required hospitalization: Unknown Has patient had a PCN reaction occurring within the last 10 years: No If all of the above answers are "NO", then may proceed with Cephalosporin use.     The results of significant diagnostics from this hospitalization (including imaging, microbiology, ancillary and laboratory) are listed below for reference.    Microbiology: Recent Results (from the past 240 hour(s))  Blood Culture (routine x 2)     Status: None (Preliminary result)   Collection  Time: 07/19/19  3:18 AM   Specimen: BLOOD  Result Value Ref Range Status   Specimen Description   Final    BLOOD LEFT ARM Performed at [facility] [address], [place], [place] [address]    Special Requests   Final    BOTTLES DRAWN AEROBIC AND ANAEROBIC Blood Culture adequate volume Performed at [facility] [address], [place], [place] [address]    Culture   Final    NO GROWTH 4 DAYS Performed at [facility], [employer] [address] [place], [place] [address]    Report Status PENDING  Incomplete  Blood Culture (routine x 2)     Status: None (Preliminary result)   Collection Time: 07/19/19  3:23 AM   Specimen: BLOOD  Result Value Ref Range Status   Specimen Description   Final    BLOOD RIGHT WRIST Performed at [facility] [address], [place], [place] [address]    Special Requests   Final    BOTTLES DRAWN AEROBIC AND ANAEROBIC Blood Culture adequate volume Performed at [facility] [address] [place], [place] [address]    Culture   Final    NO GROWTH 4 DAYS Performed at [facility], [place] [address], [place], [place] [address]    Report Status PENDING  Incomplete  Urine culture     Status: Abnormal   Collection Time: 07/19/19  4:30 AM   Specimen: Urine, Random  Result Value  Ref Range Status   Specimen Description   Final    URINE, RANDOM Performed at [facility] [address], [place], [place] [address]    Special Requests   Final    NONE Performed at [facility], [place] [address], [place], [place] [address]    Culture MULTIPLE SPECIES PRESENT, SUGGEST RECOLLECTION (A)  Final   Report Status 07/20/2019 FINAL  Final  Urine culture     Status: Abnormal   Collection Time: 07/19/19  6:38 AM   Specimen: Urine, Random  Result Value Ref Range Status   Specimen Description   Final    URINE, RANDOM Performed at [facility] [address], [place], [place] [address]    Special Requests   Final    NONE Performed at [facility], [place] [address], [place], [place] [address]    Culture (A)  Final    <10,000 COLONIES/mL INSIGNIFICANT GROWTH Performed at [facility] [address], [place],  [address]    Report Status 07/20/2019 FINAL  Final  SARS Coronavirus 2 by RT PCR (hospital order, performed in [facility] hospital lab) Nasopharyngeal Nasopharyngeal Swab     Status: None   Collection Time: 07/19/19  3:00 PM   Specimen: Nasopharyngeal Swab  Result Value Ref Range Status   SARS Coronavirus 2 NEGATIVE NEGATIVE Final    Comment: (NOTE) SARS-CoV-2 target nucleic acids are NOT DETECTED. The SARS-CoV-2 RNA is generally detectable in upper and lower respiratory specimens during the acute phase of infection. The lowest concentration of SARS-CoV-2 viral copies this assay can detect is 250 copies / mL. A negative result does not preclude SARS-CoV-2 infection and should not be used as the sole basis for treatment or other patient management decisions.  A negative result may occur with improper specimen collection / handling, submission of specimen other than nasopharyngeal swab, presence of viral mutation(s) within the areas targeted by this assay, and  inadequate number of  viral copies (<250 copies / mL). A negative result must be combined with clinical observations, patient history, and epidemiological information. Fact Sheet for Patients:   StrictlyIdeas.no Fact Sheet for Healthcare Providers: BankingDealers.co.za This test is not yet approved or cleared  by the [place] FDA and has been authorized for detection and/or diagnosis of SARS-CoV-2 by FDA under an Emergency Use Authorization (EUA).  This EUA will remain in effect (meaning this test can be used) for the duration of the COVID-19 declaration under Section 564(b)(1) of the Act, 21 U.S.C. section 360bbb-3(b)(1), unless the authorization is terminated or revoked sooner. Performed at [facility], [place] [address], [place], [place] [address]     Procedures/Studies: NM Hepatobiliary Liver Func  Result Date: 07/19/2019 CLINICAL DATA:  Evaluate for cholecystitis. Right upper quadrant pain. EXAM: NUCLEAR MEDICINE HEPATOBILIARY IMAGING TECHNIQUE: Sequential images of the abdomen were obtained out to 60 minutes following intravenous administration of radiopharmaceutical. RADIOPHARMACEUTICALS:  5.3 mCi Tc-43m  Choletec IV COMPARISON:  CT of the abdomen dated 07/19/2019 and abdominal sonogram from 07/19/2019. FINDINGS: Following the IV administration of the radiopharmaceutical there is prompt radiotracer uptake within the liver. No gallbladder, biliary activity, or small bowel activity identified after 120 minutes of imaging. Review of the CT from 07/19/2019 shows evidence of biliary obstruction with a dilated common bile duct measuring 1 cm which abruptly terminates at the level of the head of pancreas. This may be due to common bile duct stones or underlying mass. IMPRESSION: 1. Imaging findings are compatible with common bile duct obstruction which correlates to the findings from CT dated 07/19/2019. This may be due to common bile duct stones or  mass. As the patient has a pacemaker and may not qualify for MRCP recommend gastrointestinal consultation for possible ERCP. Electronically Signed   By: Kerby Moors M.D.   On: 07/19/2019 14:17   CT ABDOMEN PELVIS W CONTRAST  Result Date: 07/19/2019 CLINICAL DATA:  Generalized abdominal pain with emesis EXAM: CT ABDOMEN AND PELVIS WITH CONTRAST TECHNIQUE: Multidetector CT imaging of the abdomen and pelvis was performed using the standard protocol following bolus administration of intravenous contrast. CONTRAST:  74mL OMNIPAQUE IOHEXOL 300 MG/ML  SOLN COMPARISON:  CT 07/25/2017 FINDINGS: Lower chest: Lung bases demonstrate no acute consolidation or pleural effusion. Cardiomegaly with incompletely visualized cardiac pacing leads. Hepatobiliary: Heterogenous enhancement of the liver. No calcified gallstone. Gallbladder wall thickening versus pericholecystic fluid. No biliary dilatation Pancreas: Unremarkable. No pancreatic ductal dilatation or surrounding inflammatory changes. Spleen: Normal in size without focal abnormality. Adrenals/Urinary Tract: Adrenal glands are unremarkable. Kidneys are normal, without renal calculi, focal lesion, or hydronephrosis. Bladder is unremarkable. Stomach/Bowel: Stomach is within normal limits. Sigmoid colon diverticula without acute inflammatory process. No evidence of bowel wall thickening, distention, or inflammatory changes. Vascular/Lymphatic: Moderate aortic atherosclerosis. No aneurysm. No suspicious adenopathy Reproductive: Status post hysterectomy. No adnexal masses. Other: Negative for free air or free fluid Musculoskeletal: No acute or suspicious osseous abnormality. 9 mm anterolisthesis L5 on S1 with degenerative changes. IMPRESSION: 1. Gallbladder wall thickening versus pericholecystic fluid though without calcified gallstone by CT. Suggest correlation with ultrasound 2. Heterogenous enhancement of the liver, probably due to heterogeneous fat infiltration, though  infiltrative mass could also be considered. 3. Sigmoid colon diverticula without acute inflammatory process Electronically Signed   By: Donavan Foil M.D.   On: 07/19/2019 03:51   DG Chest Port 1 View  Result Date: 07/23/2019 CLINICAL DATA:  Shortness of breath EXAM: PORTABLE CHEST 1  VIEW COMPARISON:  07/20/2019 FINDINGS: Cardiomegaly. Left pacer remains in place, unchanged. Calcified granuloma in the left mid lung. Otherwise lungs clear. No effusions or acute bony abnormality. IMPRESSION: Cardiomegaly.  No active disease. Electronically Signed   By: Rolm Baptise M.D.   On: 07/23/2019 10:34   DG CHEST PORT 1 VIEW  Result Date: 07/20/2019 CLINICAL DATA:  Shortness of breath. EXAM: PORTABLE CHEST 1 VIEW COMPARISON:  Radiograph 02/05/2019 FINDINGS: Left-sided pacemaker in place. Lower lung volumes from prior exam. Cardiomegaly that is accentuated by low lung volumes. Interstitial prominence may reflect pulmonary edema. No focal airspace disease. Calcified granuloma in the left mid lung. No significant pleural effusion. Chronic deformity of the left proximal humerus. IMPRESSION: 1. Low lung volumes. Cardiomegaly, accentuated by low lung volumes. 2. Interstitial prominence may reflect pulmonary edema. Electronically Signed   By: Keith Rake M.D.   On: 07/20/2019 20:49   DG ERCP BILIARY & PANCREATIC DUCTS  Result Date: 07/21/2019 CLINICAL DATA:  86 year old female with choledocholithiasis. EXAM: ERCP TECHNIQUE: Multiple spot images obtained with the fluoroscopic device and submitted for interpretation post-procedure. FLUOROSCOPY TIME:  Fluoroscopy Time:  6 minutes 8 seconds COMPARISON:  CT abdomen/pelvis 07/19/2019 FINDINGS: Nine intraoperative saved images and a cine fluoro save are submitted for review. The images demonstrate a flexible duodenal scope in the descending duodenum with wire cannulation of the right intrahepatic ducts. Cholangiogram demonstrates multiple faceted filling defects in the mid  and distal common bile duct consistent with choledocholithiasis. There is mild dilatation of the common bile duct. The cystic duct is patent. Partial opacification of the gallbladder is evident. There are numerous filling defects within the gallbladder lumen consistent with cholelithiasis. Subsequent images document sphincterotomy and balloon sweeping of the common duct. IMPRESSION: 1. Cholelithiasis and choledocholithiasis. 2. ERCP with sphincterotomy and balloon sweeping of the common duct. These images were submitted for radiologic interpretation only. Please see the procedural report for the amount of contrast and the fluoroscopy time utilized. Electronically Signed   By: Jacqulynn Cadet M.D.   On: 07/21/2019 12:19   US Abdomen Limited RUQ  Result Date: 07/19/2019 CLINICAL DATA:  Pain EXAM: ULTRASOUND ABDOMEN LIMITED RIGHT UPPER QUADRANT COMPARISON:  CT abdomen dated 07/19/2019. FINDINGS: Gallbladder: Pericholecystic fluid is present. There appears to be irregular thickening of the walls of the gallbladder fundus. Hyperechoic structure is seen along the nondependent gallbladder wall, presumed calcification. Sonographic Murphy's sign was elicited during the exam. Common bile duct: Diameter: 12 mm Liver: No focal lesion identified. Within normal limits in parenchymal echogenicity. Portal vein is patent on color Doppler imaging with normal direction of blood flow towards the liver. Other: None. IMPRESSION: 1. Findings highly suspicious for acute cholecystitis, with irregular thickening of the walls of the gallbladder fundus and pericholecystic fluid. Echogenic structure along the nondependent gallbladder wall, presumably a partially calcified wall (porcelain gallbladder). 2. Prominently distended common bile duct, measuring 12 mm diameter. Recommend correlation with liver function tests. If abnormal, would certainly consider further characterization with ERCP or MRCP. Electronically Signed   By: Franki Cabot  M.D.   On: 07/19/2019 05:17    Labs: BNP (last 3 results) Recent Labs    07/19/19 0638  BNP [id number]*   Basic Metabolic Panel: Recent Labs  Lab 07/19/19 0635 07/20/19 0419 07/21/19 0454 07/22/19 0429 07/23/19 0506  NA 138 142 137 134* 134*  K 3.1* 4.0 3.4* 4.1 3.6  CL 104 105 99 101 101  CO2 23 30 25  20* 23  GLUCOSE 159* 126* 127* 174*  131*  BUN 25* 23 23 36* 34*  CREATININE 0.97 1.22* 1.19* 1.40* 1.29*  CALCIUM 7.6* 8.6* 8.7* 8.6* 8.5*   Liver Function Tests: Recent Labs  Lab 07/19/19 1533 07/20/19 0419 07/21/19 0454 07/22/19 0429 07/23/19 0506  AST 226* 122* 89* 65* 49*  ALT 166* 135* 114* 92* 78*  ALKPHOS 87 81 95 96 89  BILITOT 2.5* 3.6* 2.1* 1.1 0.9  PROT 5.7* 5.8* 6.5 6.4* 6.2*  ALBUMIN 3.1* 3.1* 3.5 3.4* 3.4*   Recent Labs  Lab 07/19/19 0139  LIPASE 27   No results for input(s): AMMONIA in the last 168 hours. CBC: Recent Labs  Lab 07/19/19 0139 07/19/19 0139 07/19/19 0635 07/20/19 0419 07/21/19 0454 07/22/19 0429 07/23/19 1033  WBC 8.3  --  10.5 9.2 9.3 7.9  --   NEUTROABS 7.1  --  9.3*  --   --   --   --   HGB 12.6   < > 11.3* 10.8* 12.4 11.7* 12.5  HCT 38.7   < > 35.3* 34.2* 38.8 36.1 39.1  MCV 97.5  --  97.0 98.3 99.2 95.5  --   PLT 174  --  156 141* 142* 144*  --    < > = values in this interval not displayed.   Cardiac Enzymes: No results for input(s): CKTOTAL, CKMB, CKMBINDEX, TROPONINI in the last 168 hours. BNP: Invalid input(s): POCBNP CBG: No results for input(s): GLUCAP in the last 168 hours. D-Dimer No results for input(s): DDIMER in the last 72 hours. Hgb A1c No results for input(s): HGBA1C in the last 72 hours. Lipid Profile No results for input(s): CHOL, HDL, LDLCALC, TRIG, CHOLHDL, LDLDIRECT in the last 72 hours. Thyroid function studies No results for input(s): TSH, T4TOTAL, T3FREE, THYROIDAB in the last 72 hours.  Invalid input(s): FREET3 Anemia work up No results for input(s): VITAMINB12, FOLATE, FERRITIN, TIBC,  IRON, RETICCTPCT in the last 72 hours. Urinalysis    Component Value Date/Time   COLORURINE YELLOW 07/19/2019 0139   APPEARANCEUR HAZY (A) 07/19/2019 0139   LABSPEC 1.018 07/19/2019 0139   PHURINE 6.0 07/19/2019 0139   GLUCOSEU NEGATIVE 07/19/2019 0139   HGBUR NEGATIVE 07/19/2019 0139   HGBUR 2+ 12/13/2008 1032   BILIRUBINUR NEGATIVE 07/19/2019 0139   BILIRUBINUR n 12/25/2010 1311   KETONESUR NEGATIVE 07/19/2019 0139   PROTEINUR 100 (A) 07/19/2019 0139   UROBILINOGEN 0.2 12/25/2010 1311   UROBILINOGEN 0.2 12/13/2008 1032   NITRITE NEGATIVE 07/19/2019 0139   LEUKOCYTESUR MODERATE (A) 07/19/2019 0139   Sepsis Labs Invalid input(s): PROCALCITONIN,  WBC,  LACTICIDVEN Microbiology Recent Results (from the past 240 hour(s))  Blood Culture (routine x 2)     Status: None (Preliminary result)   Collection Time: 07/19/19  3:18 AM   Specimen: BLOOD  Result Value Ref Range Status   Specimen Description   Final    BLOOD LEFT ARM Performed at [facility], [place] [address], [place], [place] [address]    Special Requests   Final    BOTTLES DRAWN AEROBIC AND ANAEROBIC Blood Culture adequate volume Performed at [facility] [address], [place], [place] [address]    Culture   Final    NO GROWTH 4 DAYS Performed at [facility], [place] [address], [place], [place] [address]    Report Status PENDING  Incomplete  Blood Culture (routine x 2)     Status: None (Preliminary result)   Collection Time: 07/19/19  3:23 AM   Specimen: BLOOD  Result Value Ref Range Status   Specimen Description   Final    BLOOD RIGHT WRIST Performed at [facility] [address], [place], [place] [address]    Special Requests   Final    BOTTLES DRAWN AEROBIC AND ANAEROBIC Blood Culture adequate volume Performed at [facility] [address], [place], [place] [address]    Culture   Final    NO GROWTH 4 DAYS Performed at [facility], [place] [address], [place], [place] [address]    Report Status PENDING  Incomplete  Urine culture     Status: Abnormal   Collection Time: 07/19/19  4:30 AM   Specimen: Urine, Random  Result Value Ref Range Status   Specimen Description   Final    URINE, RANDOM Performed at [facility] [address] [place], [place] [address]    Special Requests   Final    NONE Performed at [facility], [place] [address] [place], [place] [address]    Culture MULTIPLE SPECIES PRESENT, SUGGEST RECOLLECTION (A)  Final   Report Status 07/20/2019 FINAL  Final  Urine culture     Status: Abnormal   Collection Time: 07/19/19  6:38 AM   Specimen: Urine, Random  Result Value Ref Range Status   Specimen Description   Final    URINE, RANDOM Performed at [facility] [address], [place] [employer], [place] [address]    Special Requests   Final    NONE Performed at [facility], [place] [address], [place], [place] [address]    Culture (A)  Final    <10,000 COLONIES/mL INSIGNIFICANT GROWTH Performed at [facility] [address] [place], [place] [address]    Report Status 07/20/2019 FINAL  Final  SARS Coronavirus 2 by RT PCR (hospital order, performed in [facility] hospital lab) Nasopharyngeal Nasopharyngeal Swab     Status: None   Collection Time: 07/19/19  3:00 PM   Specimen: Nasopharyngeal Swab  Result Value Ref Range Status   SARS Coronavirus 2 NEGATIVE NEGATIVE Final    Comment: (NOTE) SARS-CoV-2 target nucleic acids are NOT DETECTED. The SARS-CoV-2 RNA is generally detectable in upper and lower respiratory specimens during the acute phase of infection. The lowest concentration of SARS-CoV-2 viral copies this assay can detect is 250 copies / mL. A negative result does not preclude SARS-CoV-2 infection and should not be used as the sole basis for treatment or other patient management decisions.  A negative  result may occur with improper specimen collection / handling, submission of specimen other than nasopharyngeal swab, presence of viral mutation(s) within the areas targeted by this assay, and inadequate number of viral copies (<250 copies / mL). A negative result must be combined with clinical observations, patient history, and epidemiological information. Fact Sheet for Patients:   StrictlyIdeas.no Fact Sheet for Healthcare Providers: BankingDealers.co.za This test is not yet approved or cleared  by the [place] FDA and has been authorized for detection and/or diagnosis of SARS-CoV-2 by FDA under an Emergency Use Authorization (EUA).  This EUA will remain in effect (meaning this test can be used) for the duration of the COVID-19 declaration under Section 564(b)(1) of the Act, 21 U.S.C. section 360bbb-3(b)(1), unless the authorization is terminated or revoked sooner. Performed at [facility], [place] [address] [place], [place] [address]      Time coordinating discharge: 35 minutes  SIGNED: Antonieta Pert, MD  [facility] 07/23/2019, 3:00 PM  If 7PM-7AM, please contact night-coverage [email or web address]

## 2019-07-23 NOTE — Evaluation (Signed)
Physical Therapy Evaluation Patient Details Name: Beverly Kim MRN: [id number] DOB: [birth year] Today's Date: 07/23/2019   History of Present Illness  Patient is 86 y.o. female with PMH significant for atrial fibrillation chronic anticoagulation, history of HTN, HLD, hypothyroidism, PPM, CHF, PAD, and has a pacemaker for AV block. Patient is s/p ERCP with sphincterotomy and stone extraction on 07/21/19.    Clinical Impression  Beverly Kim is 86 y.o. female admitted with above HPI and diagnosis. Patient is currently limited by functional impairments below (see PT problem list). Patient lives alone but also stays with her daughter periodically. She and her daughter report pt is independent at baseline and that she uses rollator at daughter house and still cooks, cleans, and drives. Patient will benefit from continued skilled PT interventions to address impairments and progress independence with mobility, recommending HHPT with supervision/assistance for all mobility. Acute PT will follow and progress as able.     Follow Up Recommendations Home health PT;Supervision for mobility/OOB    Equipment Recommendations  None recommended by PT(pt has rollator at home)    Recommendations for Other Services       Precautions / Restrictions Precautions Precautions: Fall Restrictions Weight Bearing Restrictions: No      Mobility  Bed Mobility        General bed mobility comments: pt OOB in chair and ended in chair.  Transfers Overall transfer level: Needs assistance Equipment used: None;1 person hand held assist Transfers: Sit to/from [facility] Sit to Stand: Min assist;Min guard Stand pivot transfers: Min assist       General transfer comment: pt able to initiate/sequence power up with bil UE use to rise. pt reaching out hand for support on object when standing from recliner and toilet. pt unsteady when initiating turn and steps for stand pivot.    Ambulation/Gait Ambulation/Gait assistance: Min assist;Min guard Gait Distance (Feet): 100 Feet Assistive device: Rolling walker (2 wheeled);1 person hand held assist;IV Pole Gait Pattern/deviations: Step-through pattern;Decreased step length - right;Decreased step length - left;Decreased stride length Gait velocity: decreased   General Gait Details: pt unsteady with stepping and heaviliy reliant on external support to maintain balance. pt reachign for objects in room for support and hesistant to attempt stepping without UE support. 1HHA provided and use of IV pole to ambulate to bathroom. RW used for gait training in hallway, pt more steady with walker for support.  Stairs            Wheelchair Mobility    Modified Rankin (Stroke Patients Only)       Balance Overall balance assessment: History of Falls;Needs assistance Sitting-balance support: Feet supported Sitting balance-Leahy Scale: Good     Standing balance support: During functional activity;Bilateral upper extremity supported Standing balance-Leahy Scale: Poor            Pertinent Vitals/Pain Pain Assessment: No/denies pain    Home Living Family/patient expects to be discharged to:: Private residence Living Arrangements: Alone;Children Available Help at Discharge: Family(pt's daughter works form home all but 1day/month.) Type of Home: House Home Access: Stairs to enter Entrance Stairs-Rails: Adult nurse of Steps: 3 with no rails at daughters home; 2-3 with Lt hand rail at [employer]. Home Layout: One level(both 1 level) Home Equipment: Walker - 4 wheels;Cane - single point Additional Comments: pt's daughter reports the patient stays with her most of the week and goes to her own home for long weekends intertmttently. she uses her rollator at her daughters house but does  not use any device at her own home. patient has a walk in shower at her home and her bathroom at her daughters is a  tub. however her daughter has a walk in shower at home that her mother can use.    Prior Function Level of Independence: Independent;Independent with assistive device(s)         Comments: pt has been independent with bathing (standing in shower) and dressing. She still cooks and prepares meals at the [employer]. She has been ambulating with rollator at daughters home and does a lot of "furniture surfing".      Hand Dominance   Dominant Hand: Right    Extremity/Trunk Assessment   Upper Extremity Assessment Upper Extremity Assessment: Generalized weakness    Lower Extremity Assessment Lower Extremity Assessment: Generalized weakness    Cervical / Trunk Assessment Cervical / Trunk Assessment: Kyphotic  Communication   Communication: HOH(has hearing aids, they don't work well)  Cognition Arousal/Alertness: Awake/alert Behavior During Therapy: WFL for tasks assessed/performed Overall Cognitive Status: Within Functional Limits for tasks assessed               General Comments      Exercises     Assessment/Plan    PT Assessment Patient needs continued PT services  PT Problem List Decreased strength;Decreased activity tolerance;Decreased balance;Decreased mobility;Decreased knowledge of use of DME;Decreased safety awareness       PT Treatment Interventions DME instruction;Gait training;Stair training;Functional mobility training;Therapeutic activities;Therapeutic exercise;Balance training;Neuromuscular re-education;Patient/family education    PT Goals (Current goals can be found in the Care Plan section)  Acute Rehab PT Goals Patient Stated Goal: go home and get stronger PT Goal Formulation: With patient Time For Goal Achievement: 07/30/19 Potential to Achieve Goals: Good    Frequency Min 3X/week    AM-PAC PT "6 Clicks" Mobility  Outcome Measure Help needed turning from your back to your side while in a flat bed without using bedrails?: A Little Help needed  moving from lying on your back to sitting on the side of a flat bed without using bedrails?: A Little Help needed moving to and from a bed to a chair (including a wheelchair)?: A Little Help needed standing up from a chair using your arms (e.g., wheelchair or bedside chair)?: A Little Help needed to walk in hospital room?: A Little Help needed climbing 3-5 steps with a railing? : A Little 6 Click Score: 18    End of Session Equipment Utilized During Treatment: Gait belt Activity Tolerance: Patient tolerated treatment well Patient left: in chair;with call bell/phone within reach;with chair alarm set;with family/visitor present Nurse Communication: Mobility status PT Visit Diagnosis: Unsteadiness on feet (R26.81);History of falling (Z91.81);Difficulty in walking, not elsewhere classified (R26.2)    Time: [id number] PT Time Calculation (min) (ACUTE ONLY): 37 min   Charges:   PT Evaluation $PT Eval Moderate Complexity: 1 Mod PT Treatments $Gait Training: 8-22 mins       Verner Mould, DPT Physical Therapist with [facility] [phone]  07/23/2019 3:30 PM

## 2019-07-23 NOTE — Progress Notes (Addendum)
Progress Note  CC:     Abdominal pain     ASSESSMENT AND PLAN:  86year-old female with PMH significant for atrial fibrillation chronic anticoagulation,history of PPM,CHF,PAD  # Choledocholithiasis / ? Cholecystitis --Her abdominal pain is better --s/p ERCP with sphincterotomy and stone extraction two days ago. LFTs continue to normalize --On Heart Healthy diet --Per [facility] - no plans for cholecystectomy given normal WBC, patent cystic duct on ERCP films. Plan is for completion of 7 days of antibiotics.    # Atrial Fibrillation.  --Regarding Xarelto and recent sphincterotomy, she can resume Xarelto on 07/26/19    SUBJECTIVE   Pain is better today, certainly much better than when admitted.   OBJECTIVE:     Vital signs in last 24 hours: Temp:  [97.5 F (36.4 C)-97.7 F (36.5 C)] 97.5 F (36.4 C) (05/20 0516) Pulse Rate:  [69-71] 71 (05/20 0742) Resp:  [16-20] 18 (05/20 0742) BP: (137-149)/(82-93) 137/93 (05/20 0516) SpO2:  [95 %-98 %] 95 % (05/20 0742) Last BM Date: 07/22/19 General:   Alert, in NAD Heart:  Regular rate . No lower extremity edema   Pulm: Normal respiratory effort   Abdomen:  Soft,  nontender, mild RUQ tenderness. Normal bowel sounds.          Neurologic:  Alert and  oriented,  grossly normal neurologically. Psych:  Pleasant, cooperative.  Normal mood and affect.   Intake/Output from previous day: 05/19 0701 - 05/20 0700 In: 1559.6 [P.O.:840; I.V.:137.6; IV Piggyback:582] Out: -  Intake/Output this shift: No intake/output data recorded.  Lab Results: Recent Labs    07/21/19 0454 07/22/19 0429  WBC 9.3 7.9  HGB 12.4 11.7*  HCT 38.8 36.1  PLT 142* 144*   BMET Recent Labs    07/21/19 0454 07/22/19 0429 07/23/19 0506  NA 137 134* 134*  K 3.4* 4.1 3.6  CL 99 101 101  CO2 25 20* 23  GLUCOSE 127* 174* 131*  BUN 23 36* 34*  CREATININE 1.19* 1.40* 1.29*  CALCIUM 8.7* 8.6* 8.5*   LFT Recent Labs    07/23/19 0506  PROT 6.2*    ALBUMIN 3.4*  AST 49*  ALT 78*  ALKPHOS 89  BILITOT 0.9   PT/INR No results for input(s): LABPROT, INR in the last 72 hours. Hepatitis Panel No results for input(s): HEPBSAG, HCVAB, HEPAIGM, HEPBIGM in the last 72 hours.  DG ERCP BILIARY & PANCREATIC DUCTS  Result Date: 07/21/2019 CLINICAL DATA:  86 year old female with choledocholithiasis. EXAM: ERCP TECHNIQUE: Multiple spot images obtained with the fluoroscopic device and submitted for interpretation post-procedure. FLUOROSCOPY TIME:  Fluoroscopy Time:  6 minutes 8 seconds COMPARISON:  CT abdomen/pelvis 07/19/2019 FINDINGS: Nine intraoperative saved images and a cine fluoro save are submitted for review. The images demonstrate a flexible duodenal scope in the descending duodenum with wire cannulation of the right intrahepatic ducts. Cholangiogram demonstrates multiple faceted filling defects in the mid and distal common bile duct consistent with choledocholithiasis. There is mild dilatation of the common bile duct. The cystic duct is patent. Partial opacification of the gallbladder is evident. There are numerous filling defects within the gallbladder lumen consistent with cholelithiasis. Subsequent images document sphincterotomy and balloon sweeping of the common duct. IMPRESSION: 1. Cholelithiasis and choledocholithiasis. 2. ERCP with sphincterotomy and balloon sweeping of the common duct. These images were submitted for radiologic interpretation only. Please see the procedural report for the amount of contrast and the fluoroscopy time utilized. Electronically Signed   By: Jacqulynn Cadet  M.D.   On: 07/21/2019 12:19     Tye Savoy ,NP 07/23/2019, 9:03 AM      ________________________________________________________________________  Velora Heckler GI MD note:  I  reviewed the data and agree with the assessment and plan described above.  NO further GI testing is planned.  She should complete 7 days of Abx.  If she has fevers or worsening  abd pains after stopping the antibiotics then she may need further care of her GB (drain or surgery).   Please call or page with any further questions or concerns.  Fu with GI PRN    Owens Loffler, MD [facility] Pager [phone]

## 2019-07-23 NOTE — Plan of Care (Signed)

## 2019-07-23 NOTE — TOC Transition Note (Signed)
Transition of Care [facility]) - CM/SW Discharge Note   Patient Details  Name: Beverly Kim MRN: [id number] Date of Birth: [birth year]  [facility] CM/SW Contact:  Dessa Phi, RN Phone Number: 07/23/2019, 3:40 PM   Clinical Narrative:PT recc HHPT-[facility] chosen rep cindy aware of d/c & HHPT orders. No further CM needs.       Final next level of care: [place] Barriers to Discharge: No Barriers Identified   Patient Goals and CMS Choice Patient states their goals for this hospitalization and ongoing recovery are:: go home CMS Medicare.gov Compare Post Acute Care list provided to:: Patient Represenative (must comment)(dtr Mickel Baas) Choice offered to / list presented to : Adult Children  Discharge Placement                       Discharge Plan and Services   Discharge Planning Services: CM Consult                      HH Arranged: PT HH Agency: [place] Date [facility] Agency Contacted: 07/23/19 Time [place]: [place] Representative spoke with at [place]: [place] ([place]) Interventions     Readmission Risk Interventions No flowsheet data found.

## 2019-07-24 LAB — CULTURE, BLOOD (ROUTINE X 2)
Culture: NO GROWTH
Culture: NO GROWTH
Special Requests: ADEQUATE
Special Requests: ADEQUATE

## 2019-07-28 ENCOUNTER — Telehealth: Payer: Self-pay

## 2019-07-28 DIAGNOSIS — N179 Acute kidney failure, unspecified: Secondary | ICD-10-CM | POA: Diagnosis not present

## 2019-07-28 DIAGNOSIS — J45909 Unspecified asthma, uncomplicated: Secondary | ICD-10-CM | POA: Diagnosis not present

## 2019-07-28 DIAGNOSIS — I442 Atrioventricular block, complete: Secondary | ICD-10-CM | POA: Diagnosis not present

## 2019-07-28 DIAGNOSIS — N1831 Chronic kidney disease, stage 3a: Secondary | ICD-10-CM | POA: Diagnosis not present

## 2019-07-28 DIAGNOSIS — E871 Hypo-osmolality and hyponatremia: Secondary | ICD-10-CM | POA: Diagnosis not present

## 2019-07-28 DIAGNOSIS — J8410 Pulmonary fibrosis, unspecified: Secondary | ICD-10-CM | POA: Diagnosis not present

## 2019-07-28 DIAGNOSIS — K810 Acute cholecystitis: Secondary | ICD-10-CM | POA: Diagnosis not present

## 2019-07-28 DIAGNOSIS — E860 Dehydration: Secondary | ICD-10-CM | POA: Diagnosis not present

## 2019-07-28 DIAGNOSIS — A419 Sepsis, unspecified organism: Secondary | ICD-10-CM | POA: Diagnosis not present

## 2019-07-28 DIAGNOSIS — I349 Nonrheumatic mitral valve disorder, unspecified: Secondary | ICD-10-CM | POA: Diagnosis not present

## 2019-07-28 DIAGNOSIS — K7689 Other specified diseases of liver: Secondary | ICD-10-CM | POA: Diagnosis not present

## 2019-07-28 DIAGNOSIS — I4891 Unspecified atrial fibrillation: Secondary | ICD-10-CM | POA: Diagnosis not present

## 2019-07-28 DIAGNOSIS — I2510 Atherosclerotic heart disease of native coronary artery without angina pectoris: Secondary | ICD-10-CM | POA: Diagnosis not present

## 2019-07-28 DIAGNOSIS — I700 Atherosclerosis of aorta: Secondary | ICD-10-CM | POA: Diagnosis not present

## 2019-07-28 DIAGNOSIS — I739 Peripheral vascular disease, unspecified: Secondary | ICD-10-CM | POA: Diagnosis not present

## 2019-07-28 DIAGNOSIS — I130 Hypertensive heart and chronic kidney disease with heart failure and stage 1 through stage 4 chronic kidney disease, or unspecified chronic kidney disease: Secondary | ICD-10-CM | POA: Diagnosis not present

## 2019-07-28 DIAGNOSIS — E785 Hyperlipidemia, unspecified: Secondary | ICD-10-CM | POA: Diagnosis not present

## 2019-07-28 DIAGNOSIS — I5033 Acute on chronic diastolic (congestive) heart failure: Secondary | ICD-10-CM | POA: Diagnosis not present

## 2019-07-28 DIAGNOSIS — I447 Left bundle-branch block, unspecified: Secondary | ICD-10-CM | POA: Diagnosis not present

## 2019-07-28 DIAGNOSIS — N390 Urinary tract infection, site not specified: Secondary | ICD-10-CM | POA: Diagnosis not present

## 2019-07-28 DIAGNOSIS — K5730 Diverticulosis of large intestine without perforation or abscess without bleeding: Secondary | ICD-10-CM | POA: Diagnosis not present

## 2019-07-28 DIAGNOSIS — E039 Hypothyroidism, unspecified: Secondary | ICD-10-CM | POA: Diagnosis not present

## 2019-07-28 DIAGNOSIS — K219 Gastro-esophageal reflux disease without esophagitis: Secondary | ICD-10-CM | POA: Diagnosis not present

## 2019-07-28 DIAGNOSIS — Z48815 Encounter for surgical aftercare following surgery on the digestive system: Secondary | ICD-10-CM | POA: Diagnosis not present

## 2019-07-28 DIAGNOSIS — M4317 Spondylolisthesis, lumbosacral region: Secondary | ICD-10-CM | POA: Diagnosis not present

## 2019-07-28 NOTE — Telephone Encounter (Signed)
Home Health Verbal Orders - Caller/Agency: Ishmael Holter Number: [phone] Requesting OT/PT/Skilled Nursing/Social Work/Speech Therapy: Physical Therapy Frequency:  1x a week for 1 week 2x a week for 2 weeks 1x a week for 2 weeks  Also, there is a itchy sore on patient elbow. Patient would also like to know what time she should take her laxative?

## 2019-07-29 NOTE — Telephone Encounter (Signed)
Ok for verbal orders  Can take laxative at any time and adjust as needed depending on how quickly it works.   Can try otc cortisone cream on elbow.

## 2019-07-29 NOTE — Telephone Encounter (Signed)
Gave response below. Moved pts appointment up to discuss issues below and other concerns.

## 2019-07-30 ENCOUNTER — Telehealth: Payer: Self-pay | Admitting: Cardiology

## 2019-07-30 NOTE — Patient Instructions (Signed)
  Blood work was ordered.     Medications reviewed and updated.  Changes include :     Your prescription(s) have been submitted to your pharmacy. Please take as directed and contact our office if you believe you are having problem(s) with the medication(s).  A referral was ordered for        Someone will call you to schedule this.    Please followup in 6 months   

## 2019-07-30 NOTE — Telephone Encounter (Signed)
OK 

## 2019-07-30 NOTE — Progress Notes (Signed)
Subjective:    Patient ID: Beverly Kim, female    DOB: 1924-04-26, 86 y.o.   MRN: [id number]  HPI The patient is here for an acute visit.  Earlier this month - acute cholecystitis/choledocholithiasis  - had ERCP.  Pleased on cipro, flagyl and ursodiol.  Did not complete antibiotics (last three pills) because she felt so sick.  She stopped the ursodiol Monday or Tuesday because that made her very sick.  She felt better for a couple of days, but now feels much worse.     Her feet and ankles have been very swollen - they are better today.  She has had some weeping from the skin.  They have been swollen since being home from the hospital - no edema on discharge. She is taking her water pills, but has not taken them today.   Starting today she worse - increased nauseous, her head feels funny.  She feel unsteady.  She has had black stool, worse SOB, abdominal discomfort across upper abdomen and on right flank, abdominal distention.     She is not taking any iron or pepto.  Medications and allergies reviewed with patient and updated if appropriate.  Patient Active Problem List   Diagnosis Date Noted  . Abdominal pain   . Acute cholecystitis 07/19/2019  . Pre-op evaluation   . Persistent atrial fibrillation ([place]) 01/23/2019  . Secondary hypercoagulable state ([place]) 01/23/2019  . Fatigue 11/25/2018  . Poor balance 10/22/2018  . Calculus of gallbladder without cholecystitis without obstruction 07/26/2017  . Pyelonephritis 07/25/2017  . Carotid disease, bilateral ([place]) 04/08/2017  . Leg pain 04/08/2017  . Non-rheumatic mitral regurgitation 07/12/2016  . Complete heart block ([place]) 05/14/2016  . Diabetes mellitus without complication ([place]) [id number]  . Degenerative disc disease, cervical 08/23/2015  . Degenerative disc disease, lumbar 08/23/2015  . Left rotator cuff tear arthropathy 07/12/2015  . Left shoulder pain 04/06/2015  . Frozen shoulder 01/03/2015  . Cough variant asthma  12/08/2013  . Upper airway cough syndrome 10/27/2013  . Dyspnea 02/03/2013  . GERD 07/06/2009  . HIATAL HERNIA 07/06/2009  . DIVERTICULOSIS, COLON 07/06/2009  . HEPATIC CYST 07/06/2009  . COLONIC POLYPS, HX OF 07/06/2009  . MIXED HEARING LOSS BILATERAL 12/13/2008  . Essential hypertension 06/19/2008  . MITRAL VALVE PROLAPSE 06/19/2008  . LBBB (left bundle branch block) 06/19/2008  . Hyperlipidemia 08/12/2007  . Hypothyroidism 12/02/2006  . CAD (coronary artery disease) 12/02/2006  . Chronic diastolic heart failure ([place]) 12/02/2006    Current Outpatient Medications on File Prior to Visit  Medication Sig Dispense Refill  . albuterol (PROVENTIL HFA;VENTOLIN HFA) 108 (90 Base) MCG/ACT inhaler Inhale 1-2 puffs into the lungs every 6 (six) hours as needed for wheezing or shortness of breath. 1 Inhaler 0  . BIOTIN PO Take 1 tablet by mouth daily.    . bisoprolol (ZEBETA) 5 MG tablet Take 1 tablet by mouth once daily (Patient taking differently: Take 5 mg by mouth daily. ) 90 tablet 0  . Calcium Carb-Cholecalciferol (CALCIUM/VITAMIN D PO) Take 1 tablet by mouth daily.     . calcium carbonate (TUMS - DOSED IN MG ELEMENTAL CALCIUM) 500 MG chewable tablet Chew 1 tablet by mouth daily as needed for indigestion or heartburn.    . cholecalciferol (VITAMIN D3) 25 MCG (1000 UNIT) tablet Take 1,000 Units by mouth daily.    . Fluticasone-Umeclidin-Vilant (TRELEGY ELLIPTA) 100-62.5-25 MCG/INH AEPB Inhale 1 puff into the lungs daily. 60 each 3  . furosemide (LASIX) 40 MG  tablet Take 1 tablet (40 mg total) by mouth daily. 90 tablet 3  . metolazone (ZAROXOLYN) 2.5 MG tablet TAKE ONE TABLET BY MOUTH THREE TIMES A WEEK (Patient taking differently: Take 2.5 mg by mouth See admin instructions. TAKE ONE TABLET BY MOUTH THREE TIMES A WEEK. Monday, Wednesday, and Saturday) 30 tablet 5  . Multiple Vitamins-Minerals (CENTRUM SILVER PO) Take 1 tablet by mouth daily.    . potassium chloride (KLOR-CON) 10 MEQ tablet  Take 1 tablet by mouth on days metolazone are taken (Patient taking differently: Take 10 mEq by mouth See admin instructions. Take 1 tablet by mouth on days metolazone are taken) 30 tablet 3  . Rivaroxaban (XARELTO) 15 MG TABS tablet Take 1 tablet (15 mg total) by mouth daily.    Marland Kitchen SYNTHROID 88 MCG tablet TAKE 1 TABLET BY MOUTH ONCE DAILY BEFORE BREAKFAST (Patient taking differently: Take 88 mcg by mouth daily before breakfast. ) 90 tablet 0  . vitamin C (ASCORBIC ACID) 500 MG tablet Take 500 mg by mouth daily.     No current facility-administered medications on file prior to visit.    Past Medical History:  Diagnosis Date  . Atrial fibrillation ([place]) 08/16/2016   observed on PPM interrogation, asymptomatic, chad2vasc score is 4.  will consider anticoagulation  . CHF (congestive heart failure) ([place])   . Colon polyps   . Coronary artery disease   . Diverticulosis   . Dyspnea   . GERD (gastroesophageal reflux disease)   . Hearing loss of both ears   . Hepatic cyst   . Hiatal hernia   . Hyperlipidemia   . Hypertension   . Hypothyroidism   . Low back pain   . Mitral valve disorders(424.0)   . Other left bundle branch block   . Palpitations   . URI (upper respiratory infection)     Past Surgical History:  Procedure Laterality Date  . CARDIAC CATHETERIZATION  04-18-01  . CARDIOVERSION N/A 01/16/2019   Procedure: CARDIOVERSION;  Surgeon: Josue Hector, MD;  Location: [facility] ENDOSCOPY;  Service: Cardiovascular;  Laterality: N/A;  . COLONOSCOPY    . ERCP N/A 07/21/2019   Procedure: ENDOSCOPIC RETROGRADE CHOLANGIOPANCREATOGRAPHY (ERCP);  Surgeon: Milus Banister, MD;  Location: [facility] ENDOSCOPY;  Service: Endoscopy;  Laterality: N/A;  . HERNIA REPAIR    . PACEMAKER IMPLANT Left 05/15/2016   SJM Assirity MRI dual chamber PPM implanted by Dr Rayann Heman for CHB  . REMOVAL OF STONES  07/21/2019   Procedure: REMOVAL OF STONES;  Surgeon: Milus Banister, MD;  Location: [facility] ENDOSCOPY;  Service:  Endoscopy;;  . [name]  07/21/2019   Procedure: [name];  Surgeon: Milus Banister, MD;  Location: [facility] ENDOSCOPY;  Service: Endoscopy;;  . THYROIDECTOMY    . TONSILLECTOMY AND ADENOIDECTOMY    . TOTAL ABDOMINAL HYSTERECTOMY W/ BILATERAL SALPINGOOPHORECTOMY      Social History   Socioeconomic History  . Marital status: Widowed    Spouse name: Not on file  . Number of children: 4  . Years of education: Not on file  . Highest education level: Not on file  Occupational History  . Occupation: retired  Tobacco Use  . Smoking status: Never Smoker  . Smokeless tobacco: Never Used  Substance and Sexual Activity  . Alcohol use: No  . Drug use: No  . Sexual activity: Never  Other Topics Concern  . Not on file  Social History Narrative  . Not on file   Social Determinants of Health   Financial  Resource Strain:   . Difficulty of Paying Living Expenses:   Food Insecurity:   . Worried About Charity fundraiser in the Last Year:   . Arboriculturist in the Last Year:   Transportation Needs:   . Film/video editor (Medical):   Marland Kitchen Lack of Transportation (Non-Medical):   Physical Activity: Sufficiently Active  . Days of Exercise per Week: 5 days  . Minutes of Exercise per Session: 40 min  Stress:   . Feeling of Stress :   Social Connections:   . Frequency of Communication with Friends and Family:   . Frequency of Social Gatherings with Friends and Family:   . Attends Religious Services:   . Active Member of Clubs or Organizations:   . Attends Archivist Meetings:   Marland Kitchen Marital Status:     Family History  Problem Relation Age of Onset  . Coronary artery disease Other        family hx of 1st degree relative <50  . Diabetes Other        family hx of  . Other Other        cardiovascular disorder family hx of  . Other Other        neurological disorder family hx of  . Other Other        respiratory disease family hx of  . Heart attack Daughter   .  Heart Problems Other        all children  . Sudden death Son   . Heart disease Brother   . Heart disease Sister   . Colon cancer Neg Hx   . Colon polyps Neg Hx     Review of Systems  Constitutional: Positive for appetite change (decreased) and chills. Negative for fever.  Respiratory: Positive for cough (mild) and shortness of breath (chronic, worse). Negative for wheezing.   Cardiovascular: Positive for leg swelling. Negative for chest pain.  Gastrointestinal: Positive for abdominal pain (RUQ) and nausea. Negative for blood in stool, constipation, diarrhea and vomiting.       Stools are black, soft  Neurological: Positive for dizziness and light-headedness. Negative for headaches.       Objective:   Vitals:   07/31/19 1452  BP: (!) 144/70  Pulse: 69  Resp: 16  Temp: 98.4 F (36.9 C)  SpO2: 98%   BP Readings from Last 3 Encounters:  07/31/19 (!) 144/70  07/23/19 139/75  06/19/19 118/70   Wt Readings from Last 3 Encounters:  07/19/19 167 lb (75.8 kg)  06/19/19 169 lb 3.2 oz (76.7 kg)  06/12/19 176 lb (79.8 kg)   Body mass index is 30.54 kg/m.   Physical Exam Constitutional:      General: She is not in acute distress.    Appearance: She is ill-appearing. She is not toxic-appearing or diaphoretic.  HENT:     Head: Normocephalic and atraumatic.  Cardiovascular:     Rate and Rhythm: Normal rate and regular rhythm.  Pulmonary:     Breath sounds: Rales (left base) present.  Abdominal:     General: There is distension.     Palpations: Abdomen is soft.     Tenderness: There is abdominal tenderness. There is no guarding or rebound.  Musculoskeletal:     Right lower leg: Edema (1+ - are of clear fluid leaking from R lower leg) present.     Left lower leg: Edema (1+) present.  Skin:    General: Skin is warm and dry.  Neurological:     Mental Status: She is alert.            Assessment & Plan:   She has multiple acute issues that require further evaluation  in a hospital setting and has been referred to the ED.  EMS was called to transport patient.   Recent acute cholecystitis, choledolithiasis: Symptom have not resolved - did not completely finish antibiotic and did not tolerate ursodiol Concern for recurrent symptoms ? Sepsis  Black stools: This is new No iron or pepto On xarelto for Afib Concern for UGIB  Fluid overloaded  She has some left basal crackles - had episodes in hospital of needing IV lasix LE edema improved now    I spent 45 minutes in preparing to see the patient by review of recent labs, imaging and procedures, obtaining and reviewing separately obtained history, communicating with the patient and family , discussing her acute problems and possible diagnoses and options for treatment.  Given the seriousness of her symptoms and possible diagnoses we all agree she needs to be in the ED   This visit occurred during the SARS-CoV-2 public health emergency.  Safety protocols were in place, including screening questions prior to the visit, additional usage of staff PPE, and extensive cleaning of exam room while observing appropriate contact time as indicated for disinfecting solutions.

## 2019-07-30 NOTE — Telephone Encounter (Signed)
New Message  Patient's daughter is calling in to get patient's heart care transferred from Dr. Percival Spanish to Dr. Ellyn Hack. States that patient is dissatisfied with care and would like to start seeing Dr. Ellyn Hack which is the same Doctor that patient's daughter is seeing. Please confirm transfer of care.

## 2019-07-31 ENCOUNTER — Other Ambulatory Visit: Payer: Self-pay

## 2019-07-31 ENCOUNTER — Encounter (HOSPITAL_COMMUNITY): Payer: Self-pay | Admitting: Emergency Medicine

## 2019-07-31 ENCOUNTER — Encounter: Payer: Self-pay | Admitting: Internal Medicine

## 2019-07-31 ENCOUNTER — Ambulatory Visit (INDEPENDENT_AMBULATORY_CARE_PROVIDER_SITE_OTHER): Payer: PPO | Admitting: Internal Medicine

## 2019-07-31 ENCOUNTER — Emergency Department (HOSPITAL_COMMUNITY)
Admission: EM | Admit: 2019-07-31 | Discharge: 2019-07-31 | Disposition: A | Discharge status: Home / Self Care | Payer: PPO | Attending: Emergency Medicine | Admitting: Emergency Medicine

## 2019-07-31 VITALS — BP 144/70 | HR 69 | Temp 98.4°F | Resp 16 | Ht 62.0 in

## 2019-07-31 DIAGNOSIS — K921 Melena: Secondary | ICD-10-CM | POA: Insufficient documentation

## 2019-07-31 DIAGNOSIS — R1011 Right upper quadrant pain: Secondary | ICD-10-CM | POA: Diagnosis not present

## 2019-07-31 DIAGNOSIS — R52 Pain, unspecified: Secondary | ICD-10-CM | POA: Diagnosis not present

## 2019-07-31 DIAGNOSIS — I4891 Unspecified atrial fibrillation: Secondary | ICD-10-CM | POA: Diagnosis not present

## 2019-07-31 DIAGNOSIS — Z5321 Procedure and treatment not carried out due to patient leaving prior to being seen by health care provider: Secondary | ICD-10-CM | POA: Diagnosis not present

## 2019-07-31 DIAGNOSIS — K810 Acute cholecystitis: Secondary | ICD-10-CM

## 2019-07-31 DIAGNOSIS — R1084 Generalized abdominal pain: Secondary | ICD-10-CM | POA: Diagnosis not present

## 2019-07-31 DIAGNOSIS — E8770 Fluid overload, unspecified: Secondary | ICD-10-CM | POA: Insufficient documentation

## 2019-07-31 DIAGNOSIS — I491 Atrial premature depolarization: Secondary | ICD-10-CM | POA: Diagnosis not present

## 2019-07-31 DIAGNOSIS — I447 Left bundle-branch block, unspecified: Secondary | ICD-10-CM | POA: Diagnosis not present

## 2019-07-31 LAB — COMPREHENSIVE METABOLIC PANEL
ALT: 28 U/L (ref 0–44)
AST: 25 U/L (ref 15–41)
Albumin: 3.6 g/dL (ref 3.5–5.0)
Alkaline Phosphatase: 72 U/L (ref 38–126)
Anion gap: 13 (ref 5–15)
BUN: 16 mg/dL (ref 8–23)
CO2: 28 mmol/L (ref 22–32)
Calcium: 9.7 mg/dL (ref 8.9–10.3)
Chloride: 97 mmol/L — ABNORMAL LOW (ref 98–111)
Creatinine, Ser: 1.02 mg/dL — ABNORMAL HIGH (ref 0.44–1.00)
GFR calc Af Amer: 55 mL/min — ABNORMAL LOW (ref >60)
GFR calc non Af Amer: 48 mL/min — ABNORMAL LOW (ref >60)
Glucose, Bld: 122 mg/dL — ABNORMAL HIGH (ref 70–99)
Potassium: 3.8 mmol/L (ref 3.5–5.1)
Sodium: 138 mmol/L (ref 135–145)
Total Bilirubin: 1 mg/dL (ref 0.3–1.2)
Total Protein: 7.1 g/dL (ref 6.5–8.1)

## 2019-07-31 LAB — CBC
HCT: 39 % (ref 36.0–46.0)
Hemoglobin: 12.7 g/dL (ref 12.0–15.0)
MCH: 31.7 pg (ref 26.0–34.0)
MCHC: 32.6 g/dL (ref 30.0–36.0)
MCV: 97.3 fL (ref 80.0–100.0)
Platelets: 218 10*3/uL (ref 150–400)
RBC: 4.01 MIL/uL (ref 3.87–5.11)
RDW: 15.4 % (ref 11.5–15.5)
WBC: 9.4 10*3/uL (ref 4.0–10.5)
nRBC: 0 % (ref 0.0–0.2)

## 2019-07-31 LAB — LIPASE, BLOOD: Lipase: 33 U/L (ref 11–51)

## 2019-07-31 MED ORDER — SODIUM CHLORIDE 0.9% FLUSH: 3.0000 mL | Freq: Once | INTRAVENOUS | Status: DC

## 2019-07-31 NOTE — ED Triage Notes (Signed)
Pt BIB [facility] from doctor's office. Pt c/o RUQ pain, recent hx of cholecystitis, was prescribed abx, unclear if pt has been taking meds due to possible reaction.

## 2019-07-31 NOTE — ED Notes (Signed)
Pt left due to wait time  

## 2019-07-31 NOTE — Telephone Encounter (Signed)
OK - sounds unnecessary -- most of her care recently has been with [facility].  Glenetta Hew, MD

## 2019-08-04 ENCOUNTER — Ambulatory Visit: Payer: PPO

## 2019-08-04 ENCOUNTER — Ambulatory Visit: Payer: PPO | Admitting: Internal Medicine

## 2019-08-04 NOTE — Progress Notes (Signed)
Subjective:    Patient ID: Beverly Kim, female    DOB: 16-Jan-1925, 86 y.o.   MRN: [id number]  HPI The patient is here for an acute visit.  Yesterday she felt pretty good for a little while, but today she does not feel well.  She feels tired and very weak.  She drinks plenty of fluids.  She is having intermittent nausea and intermittent R flank pain/RUQ.  She is eating some, but her appetite is decreased.  Eating does not make her pain worse.    Her stool were black up until a couple of days ago and now have been green.    She states some dizziness and headaches.  Her swelling in her ankles has resolved.  Her SOB is better.    Medications and allergies reviewed with patient and updated if appropriate.  Patient Active Problem List   Diagnosis Date Noted  . Black stool 07/31/2019  . Fluid overload 07/31/2019  . Abdominal pain   . Acute cholecystitis 07/19/2019  . Pre-op evaluation   . Persistent atrial fibrillation ([place]) 01/23/2019  . Secondary hypercoagulable state ([place]) 01/23/2019  . Fatigue 11/25/2018  . Poor balance 10/22/2018  . Calculus of gallbladder without cholecystitis without obstruction 07/26/2017  . Pyelonephritis 07/25/2017  . Carotid disease, bilateral ([place]) 04/08/2017  . Leg pain 04/08/2017  . Non-rheumatic mitral regurgitation 07/12/2016  . Complete heart block ([place]) 05/14/2016  . Diabetes mellitus without complication ([place]) [id number]  . Degenerative disc disease, cervical 08/23/2015  . Degenerative disc disease, lumbar 08/23/2015  . Left rotator cuff tear arthropathy 07/12/2015  . Left shoulder pain 04/06/2015  . Frozen shoulder 01/03/2015  . Cough variant asthma 12/08/2013  . Upper airway cough syndrome 10/27/2013  . Dyspnea 02/03/2013  . GERD 07/06/2009  . HIATAL HERNIA 07/06/2009  . DIVERTICULOSIS, COLON 07/06/2009  . HEPATIC CYST 07/06/2009  . COLONIC POLYPS, HX OF 07/06/2009  . MIXED HEARING LOSS BILATERAL 12/13/2008  . Essential  hypertension 06/19/2008  . MITRAL VALVE PROLAPSE 06/19/2008  . LBBB (left bundle branch block) 06/19/2008  . Hyperlipidemia 08/12/2007  . Hypothyroidism 12/02/2006  . CAD (coronary artery disease) 12/02/2006  . Chronic diastolic heart failure ([place]) 12/02/2006    Current Outpatient Medications on File Prior to Visit  Medication Sig Dispense Refill  . albuterol (PROVENTIL HFA;VENTOLIN HFA) 108 (90 Base) MCG/ACT inhaler Inhale 1-2 puffs into the lungs every 6 (six) hours as needed for wheezing or shortness of breath. 1 Inhaler 0  . BIOTIN PO Take 1 tablet by mouth daily.    . bisoprolol (ZEBETA) 5 MG tablet Take 1 tablet by mouth once daily (Patient taking differently: Take 5 mg by mouth daily. ) 90 tablet 0  . Calcium Carb-Cholecalciferol (CALCIUM/VITAMIN D PO) Take 1 tablet by mouth daily.     . calcium carbonate (TUMS - DOSED IN MG ELEMENTAL CALCIUM) 500 MG chewable tablet Chew 1 tablet by mouth daily as needed for indigestion or heartburn.    . cholecalciferol (VITAMIN D3) 25 MCG (1000 UNIT) tablet Take 1,000 Units by mouth daily.    . Fluticasone-Umeclidin-Vilant (TRELEGY ELLIPTA) 100-62.5-25 MCG/INH AEPB Inhale 1 puff into the lungs daily. 60 each 3  . furosemide (LASIX) 40 MG tablet Take 1 tablet (40 mg total) by mouth daily. 90 tablet 3  . metolazone (ZAROXOLYN) 2.5 MG tablet TAKE ONE TABLET BY MOUTH THREE TIMES A WEEK (Patient taking differently: Take 2.5 mg by mouth See admin instructions. TAKE ONE TABLET BY MOUTH THREE TIMES A WEEK.  Monday, Wednesday, and Saturday) 30 tablet 5  . Multiple Vitamins-Minerals (CENTRUM SILVER PO) Take 1 tablet by mouth daily.    . potassium chloride (KLOR-CON) 10 MEQ tablet Take 1 tablet by mouth on days metolazone are taken (Patient taking differently: Take 10 mEq by mouth See admin instructions. Take 1 tablet by mouth on days metolazone are taken) 30 tablet 3  . Rivaroxaban (XARELTO) 15 MG TABS tablet Take 1 tablet (15 mg total) by mouth daily.    Marland Kitchen  SYNTHROID 88 MCG tablet TAKE 1 TABLET BY MOUTH ONCE DAILY BEFORE BREAKFAST (Patient taking differently: Take 88 mcg by mouth daily before breakfast. ) 90 tablet 0  . vitamin C (ASCORBIC ACID) 500 MG tablet Take 500 mg by mouth daily.     No current facility-administered medications on file prior to visit.    Past Medical History:  Diagnosis Date  . Atrial fibrillation ([place]) 08/16/2016   observed on PPM interrogation, asymptomatic, chad2vasc score is 4.  will consider anticoagulation  . CHF (congestive heart failure) ([place])   . Colon polyps   . Coronary artery disease   . Diverticulosis   . Dyspnea   . GERD (gastroesophageal reflux disease)   . Hearing loss of both ears   . Hepatic cyst   . Hiatal hernia   . Hyperlipidemia   . Hypertension   . Hypothyroidism   . Low back pain   . Mitral valve disorders(424.0)   . Other left bundle branch block   . Palpitations   . URI (upper respiratory infection)     Past Surgical History:  Procedure Laterality Date  . CARDIAC CATHETERIZATION  04-18-01  . CARDIOVERSION N/A 01/16/2019   Procedure: CARDIOVERSION;  Surgeon: Josue Hector, MD;  Location[facility] ENDOSCOPY;  Service: Cardiovascular;  Laterality: N/A;  . COLONOSCOPY    . ERCP N/A 07/21/2019   Procedure: ENDOSCOPIC RETROGRADE CHOLANGIOPANCREATOGRAPHY (ERCP);  Surgeon: Milus Banister, MD;  Location: [facility] ENDOSCOPY;  Service: Endoscopy;  Laterality: N/A;  . HERNIA REPAIR    . PACEMAKER IMPLANT Left 05/15/2016   SJM Assirity MRI dual chamber PPM implanted by Dr Rayann Heman for CHB  . REMOVAL OF STONES  07/21/2019   Procedure: REMOVAL OF STONES;  Surgeon: Milus Banister, MD;  Location: [facility] ENDOSCOPY;  Service: Endoscopy;;  . [name]  07/21/2019   Procedure: [name];  Surgeon: Milus Banister, MD;  Location: [facility] ENDOSCOPY;  Service: Endoscopy;;  . THYROIDECTOMY    . TONSILLECTOMY AND ADENOIDECTOMY    . TOTAL ABDOMINAL HYSTERECTOMY W/ BILATERAL SALPINGOOPHORECTOMY      Social  History   Socioeconomic History  . Marital status: Widowed    Spouse name: Not on file  . Number of children: 4  . Years of education: Not on file  . Highest education level: Not on file  Occupational History  . Occupation: retired  Tobacco Use  . Smoking status: Never Smoker  . Smokeless tobacco: Never Used  Substance and Sexual Activity  . Alcohol use: No  . Drug use: No  . Sexual activity: Never  Other Topics Concern  . Not on file  Social History Narrative  . Not on file   Social Determinants of Health   Financial Resource Strain:   . Difficulty of Paying Living Expenses:   Food Insecurity:   . Worried About Charity fundraiser in the Last Year:   . Arboriculturist in the Last Year:   Transportation Needs:   . Film/video editor (Medical):   Marland Kitchen  Lack of Transportation (Non-Medical):   Physical Activity: Sufficiently Active  . Days of Exercise per Week: 5 days  . Minutes of Exercise per Session: 40 min  Stress:   . Feeling of Stress :   Social Connections:   . Frequency of Communication with Friends and Family:   . Frequency of Social Gatherings with Friends and Family:   . Attends Religious Services:   . Active Member of Clubs or Organizations:   . Attends Archivist Meetings:   Marland Kitchen Marital Status:     Family History  Problem Relation Age of Onset  . Coronary artery disease Other        family hx of 1st degree relative <50  . Diabetes Other        family hx of  . Other Other        cardiovascular disorder family hx of  . Other Other        neurological disorder family hx of  . Other Other        respiratory disease family hx of  . Heart attack Daughter   . Heart Problems Other        all children  . Sudden death Son   . Heart disease Brother   . Heart disease Sister   . Colon cancer Neg Hx   . Colon polyps Neg Hx     Review of Systems  Constitutional: Positive for appetite change (decreased) and fatigue. Negative for fever.        Generalized weakness  Respiratory: Positive for cough (mild) and shortness of breath (improved). Negative for wheezing.   Cardiovascular: Negative for chest pain and leg swelling.  Gastrointestinal: Positive for abdominal pain (R flank and sometimes shoulder) and nausea (intermittent). Negative for constipation and diarrhea.       Stools- black stool yesterday, now green x couple of days  Genitourinary: Negative for dysuria and hematuria.  Neurological: Positive for dizziness and headaches (presure on top of head).       Objective:   Vitals:   08/05/19 1422  BP: 120/62  Pulse: 70  Resp: 14  Temp: 98.3 F (36.8 C)  SpO2: 96%   BP Readings from Last 3 Encounters:  08/05/19 120/62  07/31/19 136/84  07/31/19 (!) 144/70   Wt Readings from Last 3 Encounters:  07/31/19 166 lb (75.3 kg)  07/19/19 167 lb (75.8 kg)  06/19/19 169 lb 3.2 oz (76.7 kg)   Body mass index is 30.36 kg/m.   Physical Exam Constitutional:      General: She is not in acute distress.    Appearance: Normal appearance. She is ill-appearing (mildly ill appearing). She is not toxic-appearing or diaphoretic.  HENT:     Head: Normocephalic and atraumatic.  Cardiovascular:     Rate and Rhythm: Normal rate and regular rhythm.  Pulmonary:     Effort: Pulmonary effort is normal. No respiratory distress.     Breath sounds: Normal breath sounds. No wheezing or rales.  Abdominal:     General: There is no distension.     Palpations: Abdomen is soft.     Tenderness: There is abdominal tenderness (tenderness in RUQ and R flank with palpation). There is no right CVA tenderness, left CVA tenderness, guarding or rebound.  Musculoskeletal:     Cervical back: Neck supple. No tenderness.     Right lower leg: Edema (trace) present.     Left lower leg: Edema (trace) present.  Lymphadenopathy:     Cervical:  No cervical adenopathy.  Skin:    General: Skin is warm and dry.  Neurological:     Mental Status: She is alert.             Assessment & Plan:    See Problem List for Assessment and Plan of chronic medical problems.    This visit occurred during the SARS-CoV-2 public health emergency.  Safety protocols were in place, including screening questions prior to the visit, additional usage of staff PPE, and extensive cleaning of exam room while observing appropriate contact time as indicated for disinfecting solutions.

## 2019-08-05 ENCOUNTER — Ambulatory Visit (INDEPENDENT_AMBULATORY_CARE_PROVIDER_SITE_OTHER): Payer: PPO | Admitting: Internal Medicine

## 2019-08-05 ENCOUNTER — Encounter: Payer: Self-pay | Admitting: Internal Medicine

## 2019-08-05 ENCOUNTER — Other Ambulatory Visit: Payer: Self-pay

## 2019-08-05 VITALS — BP 120/62 | HR 70 | Temp 98.3°F | Resp 14 | Ht 62.0 in

## 2019-08-05 DIAGNOSIS — K921 Melena: Secondary | ICD-10-CM

## 2019-08-05 DIAGNOSIS — R110 Nausea: Secondary | ICD-10-CM

## 2019-08-05 DIAGNOSIS — E038 Other specified hypothyroidism: Secondary | ICD-10-CM | POA: Diagnosis not present

## 2019-08-05 DIAGNOSIS — K810 Acute cholecystitis: Secondary | ICD-10-CM

## 2019-08-05 DIAGNOSIS — K8020 Calculus of gallbladder without cholecystitis without obstruction: Secondary | ICD-10-CM | POA: Diagnosis not present

## 2019-08-05 DIAGNOSIS — I10 Essential (primary) hypertension: Secondary | ICD-10-CM

## 2019-08-05 DIAGNOSIS — E119 Type 2 diabetes mellitus without complications: Secondary | ICD-10-CM

## 2019-08-05 DIAGNOSIS — E8770 Fluid overload, unspecified: Secondary | ICD-10-CM

## 2019-08-05 LAB — CBC WITH DIFFERENTIAL/PLATELET
Basophils Absolute: 0.1 10*3/uL (ref 0.0–0.1)
Basophils Relative: 0.8 % (ref 0.0–3.0)
Eosinophils Absolute: 0.1 10*3/uL (ref 0.0–0.7)
Eosinophils Relative: 1.4 % (ref 0.0–5.0)
HCT: 39.8 % (ref 36.0–46.0)
Hemoglobin: 13.3 g/dL (ref 12.0–15.0)
Lymphocytes Relative: 26 % (ref 12.0–46.0)
Lymphs Abs: 1.9 10*3/uL (ref 0.7–4.0)
MCHC: 33.4 g/dL (ref 30.0–36.0)
MCV: 94.8 fl (ref 78.0–100.0)
Monocytes Absolute: 0.7 10*3/uL (ref 0.1–1.0)
Monocytes Relative: 10.2 % (ref 3.0–12.0)
Neutro Abs: 4.4 10*3/uL (ref 1.4–7.7)
Neutrophils Relative %: 61.6 % (ref 43.0–77.0)
Platelets: 232 10*3/uL (ref 150.0–400.0)
RBC: 4.2 Mil/uL (ref 3.87–5.11)
RDW: 15.4 % (ref 11.5–15.5)
WBC: 7.1 10*3/uL (ref 4.0–10.5)

## 2019-08-05 LAB — COMPREHENSIVE METABOLIC PANEL
ALT: 16 U/L (ref 0–35)
AST: 21 U/L (ref 0–37)
Albumin: 4.3 g/dL (ref 3.5–5.2)
Alkaline Phosphatase: 81 U/L (ref 39–117)
BUN: 34 mg/dL — ABNORMAL HIGH (ref 6–23)
CO2: 32 mEq/L (ref 19–32)
Calcium: 10.1 mg/dL (ref 8.4–10.5)
Chloride: 94 mEq/L — ABNORMAL LOW (ref 96–112)
Creatinine, Ser: 1.08 mg/dL (ref 0.40–1.20)
GFR: 47.36 mL/min — ABNORMAL LOW (ref >60.00)
Glucose, Bld: 96 mg/dL (ref 70–99)
Potassium: 3.6 mEq/L (ref 3.5–5.1)
Sodium: 135 mEq/L (ref 135–145)
Total Bilirubin: 0.9 mg/dL (ref 0.2–1.2)
Total Protein: 7.6 g/dL (ref 6.0–8.3)

## 2019-08-05 LAB — HEMOGLOBIN A1C: Hgb A1c MFr Bld: 6.8 % — ABNORMAL HIGH (ref 4.6–6.5)

## 2019-08-05 LAB — TSH: TSH: 3.2 u[IU]/mL (ref 0.35–4.50)

## 2019-08-05 MED ORDER — ONDANSETRON 4 MG PO TBDP
4.0000 mg | ORAL_TABLET | Freq: Three times a day (TID) | ORAL | 0 refills | Status: DC | PRN
Start: 2019-08-05 — End: 2019-12-17

## 2019-08-05 NOTE — Patient Instructions (Addendum)
  Blood work was ordered.     Medications reviewed and updated.  Changes include :   An anti-nausea medication.   Your prescription(s) have been submitted to your pharmacy. Please take as directed and contact our office if you believe you are having problem(s) with the medication(s).  A referral was ordered for surgery.      Someone will call you to schedule this.

## 2019-08-05 NOTE — Assessment & Plan Note (Signed)
Chronic Check tsh

## 2019-08-05 NOTE — Assessment & Plan Note (Signed)
Chronic BP well controlled Current regimen effective and well tolerated Continue current medications at current doses cmp  

## 2019-08-05 NOTE — Assessment & Plan Note (Signed)
Acute CBC normal Now having green stool ? Cause of black stook Monitor  Cbc today

## 2019-08-05 NOTE — Assessment & Plan Note (Signed)
Resolved Continue current diuretics

## 2019-08-05 NOTE — Assessment & Plan Note (Signed)
Concern her RUQ abdominal and flank pain is related to her cholelithiasis, GB dysfunction  Blood work last Friday reassuring for no infection/cholecystitis Will refer to surgery  -  Need for further treatment/ surgery Cbc, cmp

## 2019-08-05 NOTE — Assessment & Plan Note (Signed)
S/p antibiotics - did not finish complete dose Tried ursodiol - but did not tolerate it Blood work last Friday not convincing for acute infection Cbc, cmp today Will refer to surgery - need further treatment - concern for current R flank, RUQ pain, nausea

## 2019-08-05 NOTE — Assessment & Plan Note (Signed)
Chronic Check a1c Diet controlled

## 2019-08-05 NOTE — Assessment & Plan Note (Signed)
Intermittent ? Related to cholelithiasis zofran prn

## 2019-08-06 DIAGNOSIS — E785 Hyperlipidemia, unspecified: Secondary | ICD-10-CM | POA: Diagnosis not present

## 2019-08-06 DIAGNOSIS — K7689 Other specified diseases of liver: Secondary | ICD-10-CM | POA: Diagnosis not present

## 2019-08-06 DIAGNOSIS — N390 Urinary tract infection, site not specified: Secondary | ICD-10-CM | POA: Diagnosis not present

## 2019-08-06 DIAGNOSIS — I700 Atherosclerosis of aorta: Secondary | ICD-10-CM | POA: Diagnosis not present

## 2019-08-06 DIAGNOSIS — I4891 Unspecified atrial fibrillation: Secondary | ICD-10-CM | POA: Diagnosis not present

## 2019-08-06 DIAGNOSIS — I442 Atrioventricular block, complete: Secondary | ICD-10-CM | POA: Diagnosis not present

## 2019-08-06 DIAGNOSIS — I130 Hypertensive heart and chronic kidney disease with heart failure and stage 1 through stage 4 chronic kidney disease, or unspecified chronic kidney disease: Secondary | ICD-10-CM | POA: Diagnosis not present

## 2019-08-06 DIAGNOSIS — K810 Acute cholecystitis: Secondary | ICD-10-CM | POA: Diagnosis not present

## 2019-08-06 DIAGNOSIS — I447 Left bundle-branch block, unspecified: Secondary | ICD-10-CM | POA: Diagnosis not present

## 2019-08-06 DIAGNOSIS — K5730 Diverticulosis of large intestine without perforation or abscess without bleeding: Secondary | ICD-10-CM | POA: Diagnosis not present

## 2019-08-06 DIAGNOSIS — E039 Hypothyroidism, unspecified: Secondary | ICD-10-CM | POA: Diagnosis not present

## 2019-08-06 DIAGNOSIS — J8410 Pulmonary fibrosis, unspecified: Secondary | ICD-10-CM | POA: Diagnosis not present

## 2019-08-06 DIAGNOSIS — M4317 Spondylolisthesis, lumbosacral region: Secondary | ICD-10-CM | POA: Diagnosis not present

## 2019-08-06 DIAGNOSIS — N179 Acute kidney failure, unspecified: Secondary | ICD-10-CM | POA: Diagnosis not present

## 2019-08-06 DIAGNOSIS — I349 Nonrheumatic mitral valve disorder, unspecified: Secondary | ICD-10-CM | POA: Diagnosis not present

## 2019-08-06 DIAGNOSIS — I5033 Acute on chronic diastolic (congestive) heart failure: Secondary | ICD-10-CM | POA: Diagnosis not present

## 2019-08-06 DIAGNOSIS — I739 Peripheral vascular disease, unspecified: Secondary | ICD-10-CM | POA: Diagnosis not present

## 2019-08-06 DIAGNOSIS — N1831 Chronic kidney disease, stage 3a: Secondary | ICD-10-CM | POA: Diagnosis not present

## 2019-08-06 DIAGNOSIS — E860 Dehydration: Secondary | ICD-10-CM | POA: Diagnosis not present

## 2019-08-06 DIAGNOSIS — A419 Sepsis, unspecified organism: Secondary | ICD-10-CM | POA: Diagnosis not present

## 2019-08-06 DIAGNOSIS — J45909 Unspecified asthma, uncomplicated: Secondary | ICD-10-CM | POA: Diagnosis not present

## 2019-08-06 DIAGNOSIS — E871 Hypo-osmolality and hyponatremia: Secondary | ICD-10-CM | POA: Diagnosis not present

## 2019-08-06 DIAGNOSIS — I2510 Atherosclerotic heart disease of native coronary artery without angina pectoris: Secondary | ICD-10-CM | POA: Diagnosis not present

## 2019-08-06 DIAGNOSIS — K219 Gastro-esophageal reflux disease without esophagitis: Secondary | ICD-10-CM | POA: Diagnosis not present

## 2019-08-06 DIAGNOSIS — Z48815 Encounter for surgical aftercare following surgery on the digestive system: Secondary | ICD-10-CM | POA: Diagnosis not present

## 2019-08-06 NOTE — Progress Notes (Signed)
Liver tests, thyroid function and blood counts normal.  kidney function slightly decreased - she looks slightly dehydrated. Sugars controlled.

## 2019-08-07 ENCOUNTER — Ambulatory Visit: Payer: PPO | Admitting: Internal Medicine

## 2019-08-07 ENCOUNTER — Telehealth: Payer: Self-pay

## 2019-08-07 NOTE — Telephone Encounter (Signed)
Daughter aware of results.

## 2019-08-07 NOTE — Telephone Encounter (Signed)
New message    Daughter calling back regarding test results

## 2019-08-18 DIAGNOSIS — I739 Peripheral vascular disease, unspecified: Secondary | ICD-10-CM

## 2019-08-18 DIAGNOSIS — J45909 Unspecified asthma, uncomplicated: Secondary | ICD-10-CM

## 2019-08-18 DIAGNOSIS — K810 Acute cholecystitis: Secondary | ICD-10-CM | POA: Diagnosis not present

## 2019-08-18 DIAGNOSIS — M4317 Spondylolisthesis, lumbosacral region: Secondary | ICD-10-CM

## 2019-08-18 DIAGNOSIS — E785 Hyperlipidemia, unspecified: Secondary | ICD-10-CM

## 2019-08-18 DIAGNOSIS — N1831 Chronic kidney disease, stage 3a: Secondary | ICD-10-CM | POA: Diagnosis not present

## 2019-08-18 DIAGNOSIS — H9193 Unspecified hearing loss, bilateral: Secondary | ICD-10-CM

## 2019-08-18 DIAGNOSIS — N179 Acute kidney failure, unspecified: Secondary | ICD-10-CM

## 2019-08-18 DIAGNOSIS — A419 Sepsis, unspecified organism: Secondary | ICD-10-CM | POA: Diagnosis not present

## 2019-08-18 DIAGNOSIS — Z86010 Personal history of colonic polyps: Secondary | ICD-10-CM

## 2019-08-18 DIAGNOSIS — Z7901 Long term (current) use of anticoagulants: Secondary | ICD-10-CM

## 2019-08-18 DIAGNOSIS — N390 Urinary tract infection, site not specified: Secondary | ICD-10-CM | POA: Diagnosis not present

## 2019-08-18 DIAGNOSIS — I130 Hypertensive heart and chronic kidney disease with heart failure and stage 1 through stage 4 chronic kidney disease, or unspecified chronic kidney disease: Secondary | ICD-10-CM | POA: Diagnosis not present

## 2019-08-18 DIAGNOSIS — E871 Hypo-osmolality and hyponatremia: Secondary | ICD-10-CM

## 2019-08-18 DIAGNOSIS — I700 Atherosclerosis of aorta: Secondary | ICD-10-CM

## 2019-08-18 DIAGNOSIS — Z9181 History of falling: Secondary | ICD-10-CM

## 2019-08-18 DIAGNOSIS — I442 Atrioventricular block, complete: Secondary | ICD-10-CM | POA: Diagnosis not present

## 2019-08-18 DIAGNOSIS — K7689 Other specified diseases of liver: Secondary | ICD-10-CM

## 2019-08-18 DIAGNOSIS — I2510 Atherosclerotic heart disease of native coronary artery without angina pectoris: Secondary | ICD-10-CM

## 2019-08-18 DIAGNOSIS — I5033 Acute on chronic diastolic (congestive) heart failure: Secondary | ICD-10-CM | POA: Diagnosis not present

## 2019-08-18 DIAGNOSIS — I447 Left bundle-branch block, unspecified: Secondary | ICD-10-CM

## 2019-08-18 DIAGNOSIS — Z950 Presence of cardiac pacemaker: Secondary | ICD-10-CM

## 2019-08-18 DIAGNOSIS — J8410 Pulmonary fibrosis, unspecified: Secondary | ICD-10-CM

## 2019-08-18 DIAGNOSIS — K219 Gastro-esophageal reflux disease without esophagitis: Secondary | ICD-10-CM

## 2019-08-18 DIAGNOSIS — I349 Nonrheumatic mitral valve disorder, unspecified: Secondary | ICD-10-CM

## 2019-08-18 DIAGNOSIS — Z48815 Encounter for surgical aftercare following surgery on the digestive system: Secondary | ICD-10-CM | POA: Diagnosis not present

## 2019-08-18 DIAGNOSIS — I4891 Unspecified atrial fibrillation: Secondary | ICD-10-CM | POA: Diagnosis not present

## 2019-08-18 DIAGNOSIS — E039 Hypothyroidism, unspecified: Secondary | ICD-10-CM

## 2019-08-18 DIAGNOSIS — K5730 Diverticulosis of large intestine without perforation or abscess without bleeding: Secondary | ICD-10-CM

## 2019-08-18 DIAGNOSIS — Z7951 Long term (current) use of inhaled steroids: Secondary | ICD-10-CM

## 2019-08-18 DIAGNOSIS — E860 Dehydration: Secondary | ICD-10-CM

## 2019-08-21 ENCOUNTER — Ambulatory Visit (INDEPENDENT_AMBULATORY_CARE_PROVIDER_SITE_OTHER): Payer: PPO | Admitting: *Deleted

## 2019-08-21 DIAGNOSIS — I442 Atrioventricular block, complete: Secondary | ICD-10-CM | POA: Diagnosis not present

## 2019-08-24 LAB — CUP PACEART REMOTE DEVICE CHECK
Battery Remaining Longevity: 111 mo
Battery Remaining Percentage: 95.5 %
Battery Voltage: 2.99 V
Brady Statistic AP VP Percent: 55 %
Brady Statistic AP VS Percent: 1 %
Brady Statistic AS VP Percent: 42 %
Brady Statistic AS VS Percent: 1 %
Brady Statistic RA Percent Paced: 27 %
Brady Statistic RV Percent Paced: 97 %
Date Time Interrogation Session: 20210620004414
Implantable Lead Implant Date: 20180313
Implantable Lead Implant Date: 20180313
Implantable Lead Location: 753859
Implantable Lead Location: 753860
Implantable Pulse Generator Implant Date: 20180313
Lead Channel Impedance Value: 380 Ohm
Lead Channel Impedance Value: 440 Ohm
Lead Channel Pacing Threshold Amplitude: 1 V
Lead Channel Pacing Threshold Amplitude: 1.25 V
Lead Channel Pacing Threshold Pulse Width: 0.5 ms
Lead Channel Pacing Threshold Pulse Width: 0.5 ms
Lead Channel Sensing Intrinsic Amplitude: 11.5 mV
Lead Channel Sensing Intrinsic Amplitude: 2.6 mV
Lead Channel Setting Pacing Amplitude: 1.25 V
Lead Channel Setting Pacing Amplitude: 2.5 V
Lead Channel Setting Pacing Pulse Width: 0.5 ms
Lead Channel Setting Sensing Sensitivity: 4 mV
Pulse Gen Model: 2272
Pulse Gen Serial Number: 8005625

## 2019-08-24 NOTE — Progress Notes (Signed)
Remote pacemaker transmission.   

## 2019-08-27 ENCOUNTER — Ambulatory Visit: Payer: PPO

## 2019-08-31 ENCOUNTER — Other Ambulatory Visit: Payer: Self-pay | Admitting: Internal Medicine

## 2019-09-01 ENCOUNTER — Ambulatory Visit: Payer: PPO | Admitting: Internal Medicine

## 2019-09-01 ENCOUNTER — Ambulatory Visit: Payer: PPO

## 2019-09-10 NOTE — Progress Notes (Signed)
Subjective:    Patient ID: Beverly Kim, female    DOB: 09/21/1924, 86 y.o.   MRN: [id number]  HPI The patient is here for an acute visit.   She has a recurring rash on her feet and ankles.  The rash was on her anterior right ankle and it is red and itchy.  She applied CerVe and it helped - the rash went away.  She now has rash on the posterior right and left ankles and her proximal foot.  She was concerned because she is a diabetic.     Dizziness, Poor balance, heavy feeling in her head.  She has had this symptoms for a while.  They have gotten worse and are now constant.  She just does not feel right in her head.  She has to hold on when she walks and stumbles.      Medications and allergies reviewed with patient and updated if appropriate.  Patient Active Problem List   Diagnosis Date Noted  . Nausea 08/05/2019  . Black stool 07/31/2019  . Fluid overload 07/31/2019  . Abdominal pain   . Acute cholecystitis 07/19/2019  . Persistent atrial fibrillation ([place]) 01/23/2019  . Secondary hypercoagulable state ([place]) 01/23/2019  . Fatigue 11/25/2018  . Poor balance 10/22/2018  . Calculus of gallbladder without cholecystitis without obstruction 07/26/2017  . Pyelonephritis 07/25/2017  . Carotid disease, bilateral ([place]) 04/08/2017  . Leg pain 04/08/2017  . Non-rheumatic mitral regurgitation 07/12/2016  . Complete heart block ([place]) 05/14/2016  . Diabetes mellitus without complication ([place]) 99/37/1696  . Degenerative disc disease, cervical 08/23/2015  . Degenerative disc disease, lumbar 08/23/2015  . Left rotator cuff tear arthropathy 07/12/2015  . Left shoulder pain 04/06/2015  . Frozen shoulder 01/03/2015  . Cough variant asthma 12/08/2013  . Upper airway cough syndrome 10/27/2013  . Dyspnea 02/03/2013  . GERD 07/06/2009  . HIATAL HERNIA 07/06/2009  . DIVERTICULOSIS, COLON 07/06/2009  . HEPATIC CYST 07/06/2009  . COLONIC POLYPS, HX OF 07/06/2009  . MIXED HEARING LOSS  BILATERAL 12/13/2008  . Essential hypertension 06/19/2008  . MITRAL VALVE PROLAPSE 06/19/2008  . LBBB (left bundle branch block) 06/19/2008  . Hyperlipidemia 08/12/2007  . Hypothyroidism 12/02/2006  . CAD (coronary artery disease) 12/02/2006  . Chronic diastolic heart failure ([place]) 12/02/2006    Current Outpatient Medications on File Prior to Visit  Medication Sig Dispense Refill  . albuterol (PROVENTIL HFA;VENTOLIN HFA) 108 (90 Base) MCG/ACT inhaler Inhale 1-2 puffs into the lungs every 6 (six) hours as needed for wheezing or shortness of breath. 1 Inhaler 0  . BIOTIN PO Take 1 tablet by mouth daily.    . Calcium Carb-Cholecalciferol (CALCIUM/VITAMIN D PO) Take 1 tablet by mouth daily.     . calcium carbonate (TUMS - DOSED IN MG ELEMENTAL CALCIUM) 500 MG chewable tablet Chew 1 tablet by mouth daily as needed for indigestion or heartburn.    . cholecalciferol (VITAMIN D3) 25 MCG (1000 UNIT) tablet Take 1,000 Units by mouth daily.    . Fluticasone-Umeclidin-Vilant (TRELEGY ELLIPTA) 100-62.5-25 MCG/INH AEPB Inhale 1 puff into the lungs daily. 60 each 3  . furosemide (LASIX) 40 MG tablet Take 1 tablet (40 mg total) by mouth daily. 90 tablet 3  . metolazone (ZAROXOLYN) 2.5 MG tablet TAKE ONE TABLET BY MOUTH THREE TIMES A WEEK (Patient taking differently: Take 2.5 mg by mouth See admin instructions. TAKE ONE TABLET BY MOUTH THREE TIMES A WEEK. Monday, Wednesday, and Saturday) 30 tablet 5  . Multiple Vitamins-Minerals (  CENTRUM SILVER PO) Take 1 tablet by mouth daily.    . ondansetron (ZOFRAN ODT) 4 MG disintegrating tablet Take 1 tablet (4 mg total) by mouth every 8 (eight) hours as needed for nausea or vomiting. 21 tablet 0  . potassium chloride (KLOR-CON) 10 MEQ tablet Take 1 tablet by mouth on days metolazone are taken (Patient taking differently: Take 10 mEq by mouth See admin instructions. Take 1 tablet by mouth on days metolazone are taken) 30 tablet 3  . Rivaroxaban (XARELTO) 15 MG TABS  tablet Take 1 tablet (15 mg total) by mouth daily.    . vitamin C (ASCORBIC ACID) 500 MG tablet Take 500 mg by mouth daily.     No current facility-administered medications on file prior to visit.    Past Medical History:  Diagnosis Date  . Atrial fibrillation ([place]) 08/16/2016   observed on PPM interrogation, asymptomatic, chad2vasc score is 4.  will consider anticoagulation  . CHF (congestive heart failure) ([place])   . Colon polyps   . Coronary artery disease   . Diverticulosis   . Dyspnea   . GERD (gastroesophageal reflux disease)   . Hearing loss of both ears   . Hepatic cyst   . Hiatal hernia   . Hyperlipidemia   . Hypertension   . Hypothyroidism   . Low back pain   . Mitral valve disorders(424.0)   . Other left bundle branch block   . Palpitations   . URI (upper respiratory infection)     Past Surgical History:  Procedure Laterality Date  . CARDIAC CATHETERIZATION  04-18-01  . CARDIOVERSION N/A 01/16/2019   Procedure: CARDIOVERSION;  Surgeon: Josue Hector, MD;  Location: [facility] ENDOSCOPY;  Service: Cardiovascular;  Laterality: N/A;  . COLONOSCOPY    . ERCP N/A 07/21/2019   Procedure: ENDOSCOPIC RETROGRADE CHOLANGIOPANCREATOGRAPHY (ERCP);  Surgeon: Milus Banister, MD;  Location: [facility] ENDOSCOPY;  Service: Endoscopy;  Laterality: N/A;  . HERNIA REPAIR    . PACEMAKER IMPLANT Left 05/15/2016   SJM Assirity MRI dual chamber PPM implanted by Dr Rayann Heman for CHB  . REMOVAL OF STONES  07/21/2019   Procedure: REMOVAL OF STONES;  Surgeon: Milus Banister, MD;  Location: [facility] ENDOSCOPY;  Service: Endoscopy;;  . [name]  07/21/2019   Procedure: [name];  Surgeon: Milus Banister, MD;  Location: [facility] ENDOSCOPY;  Service: Endoscopy;;  . THYROIDECTOMY    . TONSILLECTOMY AND ADENOIDECTOMY    . TOTAL ABDOMINAL HYSTERECTOMY W/ BILATERAL SALPINGOOPHORECTOMY      Social History   Socioeconomic History  . Marital status: Widowed    Spouse name: Not on file  . Number of  children: 4  . Years of education: Not on file  . Highest education level: Not on file  Occupational History  . Occupation: retired  Tobacco Use  . Smoking status: Never Smoker  . Smokeless tobacco: Never Used  Vaping Use  . Vaping Use: Never used  Substance and Sexual Activity  . Alcohol use: No  . Drug use: No  . Sexual activity: Never  Other Topics Concern  . Not on file  Social History Narrative  . Not on file   Social Determinants of Health   Financial Resource Strain: Low Risk   . Difficulty of Paying Living Expenses: Not hard at all  Food Insecurity: No Food Insecurity  . Worried About Charity fundraiser in the Last Year: Never true  . Ran Out of Food in the Last Year: Never true  Transportation Needs:  No Transportation Needs  . Lack of Transportation (Medical): No  . Lack of Transportation (Non-Medical): No  Physical Activity:   . Days of Exercise per Week:   . Minutes of Exercise per Session:   Stress: No Stress Concern Present  . Feeling of Stress : Not at all  Social Connections: Moderately Integrated  . Frequency of Communication with Friends and Family: More than three times a week  . Frequency of Social Gatherings with Friends and Family: More than three times a week  . Attends Religious Services: More than 4 times per year  . Active Member of Clubs or Organizations: Yes  . Attends Archivist Meetings: More than 4 times per year  . Marital Status: Widowed    Family History  Problem Relation Age of Onset  . Coronary artery disease Other        family hx of 1st degree relative <50  . Diabetes Other        family hx of  . Other Other        cardiovascular disorder family hx of  . Other Other        neurological disorder family hx of  . Other Other        respiratory disease family hx of  . Heart attack Daughter   . Heart Problems Other        all children  . Sudden death Son   . Heart disease Brother   . Heart disease Sister   . Colon  cancer Neg Hx   . Colon polyps Neg Hx     Review of Systems  Constitutional: Negative for chills and fever.  Respiratory: Negative for shortness of breath.   Cardiovascular: Negative for chest pain, palpitations and leg swelling.  Musculoskeletal: Positive for arthralgias, gait problem and neck pain.  Skin: Positive for rash. Negative for wound.  Neurological: Positive for dizziness. Negative for weakness, numbness and headaches.       Objective:   Vitals:   09/11/19 1006  BP: 120/70  Pulse: 73   BP Readings from Last 3 Encounters:  09/11/19 120/70  09/11/19 120/70  08/05/19 120/62   Wt Readings from Last 3 Encounters:  09/11/19 159 lb (72.1 kg)  09/11/19 159 lb 12.8 oz (72.5 kg)  07/31/19 166 lb (75.3 kg)   Body mass index is 29.08 kg/m.   Physical Exam Constitutional:      General: She is not in acute distress.    Appearance: Normal appearance. She is not ill-appearing.  HENT:     Head: Normocephalic and atraumatic.  Cardiovascular:     Rate and Rhythm: Normal rate. Rhythm irregular.  Pulmonary:     Effort: Pulmonary effort is normal.     Breath sounds: Normal breath sounds.  Musculoskeletal:     Right lower leg: No edema.     Left lower leg: No edema.  Skin:    General: Skin is warm and dry.     Findings: Rash (b/l posterior ankles and proximal feet - papules some appear crusted, dry skin with erythema) present.  Neurological:     General: No focal deficit present.     Mental Status: She is alert. She is disoriented.     Sensory: No sensory deficit.     Motor: No weakness.            Assessment & Plan:    See Problem List for Assessment and Plan of chronic medical problems.    This visit occurred  during the SARS-CoV-2 public health emergency.  Safety protocols were in place, including screening questions prior to the visit, additional usage of staff PPE, and extensive cleaning of exam room while observing appropriate contact time as indicated  for disinfecting solutions.

## 2019-09-11 ENCOUNTER — Ambulatory Visit (INDEPENDENT_AMBULATORY_CARE_PROVIDER_SITE_OTHER): Payer: PPO

## 2019-09-11 ENCOUNTER — Ambulatory Visit (INDEPENDENT_AMBULATORY_CARE_PROVIDER_SITE_OTHER): Payer: PPO | Admitting: Internal Medicine

## 2019-09-11 ENCOUNTER — Encounter: Payer: Self-pay | Admitting: Internal Medicine

## 2019-09-11 ENCOUNTER — Other Ambulatory Visit: Payer: Self-pay

## 2019-09-11 VITALS — BP 120/70 | HR 73 | Temp 98.4°F | Ht 62.0 in | Wt 159.8 lb

## 2019-09-11 VITALS — BP 120/70 | HR 73 | Wt 159.0 lb

## 2019-09-11 DIAGNOSIS — R42 Dizziness and giddiness: Secondary | ICD-10-CM

## 2019-09-11 DIAGNOSIS — G3281 Cerebellar ataxia in diseases classified elsewhere: Secondary | ICD-10-CM

## 2019-09-11 DIAGNOSIS — Z0000 Encounter for general adult medical examination without abnormal findings: Secondary | ICD-10-CM

## 2019-09-11 DIAGNOSIS — R2681 Unsteadiness on feet: Secondary | ICD-10-CM

## 2019-09-11 DIAGNOSIS — L301 Dyshidrosis [pompholyx]: Secondary | ICD-10-CM

## 2019-09-11 MED ORDER — BISOPROLOL FUMARATE 5 MG PO TABS: 5.0000 mg | ORAL_TABLET | Freq: Every day | ORAL | 3 refills | Status: DC

## 2019-09-11 MED ORDER — TRIAMCINOLONE ACETONIDE 0.1 % EX CREA
1.0000 | TOPICAL_CREAM | Freq: Two times a day (BID) | CUTANEOUS | 1 refills | Status: DC | PRN
Start: 2019-09-11 — End: 2020-05-31

## 2019-09-11 MED ORDER — SYNTHROID 88 MCG PO TABS: ORAL_TABLET | ORAL | 3 refills | Status: DC

## 2019-09-11 NOTE — Patient Instructions (Addendum)
Use the Cer Ve often and anywhere needed.  Use the steroid cream as needed for your rash - apply twice daily as needed.     You saw Dr Barry Dienes in the hospital for your gallbladder.     A ct scan of your head was ordered for your dizziness.  A referral was ordered for an ENT.      Dyshidrotic Eczema Dyshidrotic eczema (pompholyx) is a type of eczema that causes very itchy (pruritic), fluid-filled blisters (vesicles) to form on the hands and feet. It can affect people of any age, but is more common before the age of 29. There is no cure, but treatment and certain lifestyle changes can help relieve symptoms. What are the causes? The cause of this condition is not known. What increases the risk? You are more likely to develop this condition if:  You wash your hands frequently.  You have a personal history or family history of eczema, allergies, asthma, or hay fever.  You are allergic to metals such as nickel or cobalt.  You work with cement.  You smoke. What are the signs or symptoms? Symptoms of this condition may affect the hands, feet, or both. Symptoms may come and go (recur), and may include:  Severe itching, which may happen before blisters appear.  Blisters. These may form suddenly. ? In the early stages, blisters may form near the fingertips. ? In severe cases, blisters may grow to large blister masses (bullae). ? Blisters resolve in 2-3 weeks without bursting. This is followed by a dry phase in which itching eases.  Pain and swelling.  Cracks or long, narrow openings (fissures) in the skin.  Severe dryness.  Ridges on the nails. How is this diagnosed? This condition may be diagnosed based on:  A physical exam.  Your symptoms.  Your medical history.  Skin scrapings to rule out a fungal infection.  Testing a swab of fluid for bacteria (culture).  Removing and checking a small piece of skin (biopsy) in order to test for infection or to rule out other  conditions.  Skin patch tests. These tests involve taking patches that contain possible allergens and placing them on your back. Your health care provider will wait a few days and then check to see if an allergic reaction occurred. These tests may be done if your health care provider suspects allergic reactions, or to rule out other types of eczema. You may be referred to a health care provider who specializes in the skin (dermatologist) to help diagnose and treat this condition. How is this treated? There is no cure for this condition, but treatment can help relieve symptoms. Depending on how many blisters you have and how severe they are, your health care provider may suggest:  Avoiding allergens, irritants, or triggers that worsen symptoms. This may involve lifestyle changes such as: ? Using different lotions or soaps. ? Avoiding hot weather or places that will cause you to sweat a lot. ? Managing stress with coping techniques such as relaxation and exercise, and asking for help when you need it. ? Diet changes as recommended by your health care provider.  Using a clean, damp towel (cool compress) to relieve symptoms.  Soaking in a bath that contains a type of salt that relieves irritation (aluminum acetate soaks).  Medicine taken by mouth to reduce itching (oral antihistamines).  Medicine applied to the skin to reduce swelling and irritation (topical corticosteroids).  Medicine that reduces the activity of the body's disease-fighting system (immunosuppressants) to  treat inflammation. This may be given in severe cases.  Antibiotic medicines to treat bacterial infection.  Light therapy (phototherapy). This involves shining ultraviolet (UV) light on affected skin in order to reduce itchiness and inflammation. Follow these instructions at home: Bathing and skin care   Wash skin gently. After bathing or washing your hands, pat your skin dry. Avoid rubbing your skin.  Remove all jewelry  before bathing. If the skin under the jewelry stays wet, blisters may form or get worse.  Apply cool compresses as told by your health care provider: ? Soak a clean towel in cool water. ? Wring out excess water until towel is damp. ? Place the towel over affected skin. Leave the towel on for 20 minutes at a time, 2-3 times a day.  Use mild soaps, cleansers, and lotions that do not contain dyes, perfumes, or other irritants.  Keep your skin hydrated. To do this: ? Avoid very hot water. Take lukewarm baths or showers. ? Apply moisturizer within three minutes of bathing. This locks in moisture. Medicines  Take and apply over-the-counter and prescription medicines only as told by your health care provider.  If you were prescribed antibiotic medicine, take or apply it as told by your health care provider. Do not stop using the antibiotic even if you start to feel better. General instructions  Identify and avoid triggers and allergens.  Keep fingernails short to avoid breaking open the skin while scratching.  Use waterproof gloves to protect your hands when doing work that keeps your hands wet for a long time.  Wear socks to keep your feet dry.  Do not use any products that contain nicotine or tobacco, such as cigarettes and e-cigarettes. If you need help quitting, ask your health care provider.  Keep all follow-up visits as told by your health care provider. This is important. Contact a health care provider if:  You have symptoms that do not go away.  You have signs of infection, such as: ? Crusting, pus, or a bad smell. ? More redness, swelling, or pain. ? Increased warmth in the affected area. Summary  Dyshidrotic eczema (pompholyx) is a type of eczema that causes very itchy (pruritic), fluid-filled blisters (vesicles) to form on the hands and feet.  The cause of this condition is not known.  There is no cure for this condition, but treatment can help relieve symptoms.  Treatment depends on how many blisters you have and how severe they are.  Use mild soaps, cleansers, and lotions that do not contain dyes, perfumes, or other irritants. Keep your skin hydrated. This information is not intended to replace advice given to you by your health care provider. Make sure you discuss any questions you have with your health care provider. Document Revised: 06/11/2018 Document Reviewed: 07/05/2016 Elsevier Patient Education  [place].

## 2019-09-11 NOTE — Patient Instructions (Signed)
Beverly Kim , Thank you for taking time to come for your Medicare Wellness Visit. I appreciate your ongoing commitment to your health goals. Please review the following plan we discussed and let me know if I can assist you in the future.   Screening recommendations/referrals: Colonoscopy: not a candidate for colon cancer screening Mammogram: 01/19/2010 Bone Density: never done Recommended yearly ophthalmology/optometry visit for glaucoma screening and checkup Recommended yearly dental visit for hygiene and checkup  Vaccinations: Influenza vaccine: 01/15/2017 Pneumococcal vaccine: not complete Tdap vaccine: overdue Shingles vaccine: never done   Covid-19: completed  Advanced directives: Advance directive discussed with you today. Even though you declined this today please call our office should you change your mind and we can give you the proper paperwork for you to fill out.  Conditions/risks identified: Yes.  Please watch your sugar and carbohydrate intake.    Next appointment: Please schedule your next Medicare Wellness Visit in 1 year.   Preventive Care 31 Years and Older, Female Preventive care refers to lifestyle choices and visits with your health care provider that can promote health and wellness. What does preventive care include?  A yearly physical exam. This is also called an annual well check.  Dental exams once or twice a year.  Routine eye exams. Ask your health care provider how often you should have your eyes checked.  Personal lifestyle choices, including:  Daily care of your teeth and gums.  Regular physical activity.  Eating a healthy diet.  Avoiding tobacco and drug use.  Limiting alcohol use.  Practicing safe sex.  Taking low-dose aspirin every day.  Taking vitamin and mineral supplements as recommended by your health care provider. What happens during an annual well check? The services and screenings done by your health care provider during your  annual well check will depend on your age, overall health, lifestyle risk factors, and family history of disease. Counseling  Your health care provider may ask you questions about your:  Alcohol use.  Tobacco use.  Drug use.  Emotional well-being.  Home and relationship well-being.  Sexual activity.  Eating habits.  History of falls.  Memory and ability to understand (cognition).  Work and work Statistician.  Reproductive health. Screening  You may have the following tests or measurements:  Height, weight, and BMI.  Blood pressure.  Lipid and cholesterol levels. These may be checked every 5 years, or more frequently if you are over 7 years old.  Skin check.  Lung cancer screening. You may have this screening every year starting at age 13 if you have a 30-pack-year history of smoking and currently smoke or have quit within the past 15 years.  Fecal occult blood test (FOBT) of the stool. You may have this test every year starting at age 64.  Flexible sigmoidoscopy or colonoscopy. You may have a sigmoidoscopy every 5 years or a colonoscopy every 10 years starting at age 72.  Hepatitis C blood test.  Hepatitis B blood test.  Sexually transmitted disease (STD) testing.  Diabetes screening. This is done by checking your blood sugar (glucose) after you have not eaten for a while (fasting). You may have this done every 1-3 years.  Bone density scan. This is done to screen for osteoporosis. You may have this done starting at age 76.  Mammogram. This may be done every 1-2 years. Talk to your health care provider about how often you should have regular mammograms. Talk with your health care provider about your test results, treatment options,  and if necessary, the need for more tests. Vaccines  Your health care provider may recommend certain vaccines, such as:  Influenza vaccine. This is recommended every year.  Tetanus, diphtheria, and acellular pertussis (Tdap, Td)  vaccine. You may need a Td booster every 10 years.  Zoster vaccine. You may need this after age 55.  Pneumococcal 13-valent conjugate (PCV13) vaccine. One dose is recommended after age 36.  Pneumococcal polysaccharide (PPSV23) vaccine. One dose is recommended after age 8. Talk to your health care provider about which screenings and vaccines you need and how often you need them. This information is not intended to replace advice given to you by your health care provider. Make sure you discuss any questions you have with your health care provider. Document Released: 03/18/2015 Document Revised: 11/09/2015 Document Reviewed: 12/21/2014 Elsevier Interactive Patient Education  2017 [employer] Prevention in the Home Falls can cause injuries. They can happen to people of all ages. There are many things you can do to make your home safe and to help prevent falls. What can I do on the outside of my home?  Regularly fix the edges of walkways and driveways and fix any cracks.  Remove anything that might make you trip as you walk through a door, such as a raised step or threshold.  Trim any bushes or trees on the path to your home.  Use bright outdoor lighting.  Clear any walking paths of anything that might make someone trip, such as rocks or tools.  Regularly check to see if handrails are loose or broken. Make sure that both sides of any steps have handrails.  Any raised decks and porches should have guardrails on the edges.  Have any leaves, snow, or ice cleared regularly.  Use sand or salt on walking paths during winter.  Clean up any spills in your garage right away. This includes oil or grease spills. What can I do in the bathroom?  Use night lights.  Install grab bars by the toilet and in the tub and shower. Do not use towel bars as grab bars.  Use non-skid mats or decals in the tub or shower.  If you need to sit down in the shower, use a plastic, non-slip  stool.  Keep the floor dry. Clean up any water that spills on the floor as soon as it happens.  Remove soap buildup in the tub or shower regularly.  Attach bath mats securely with double-sided non-slip rug tape.  Do not have throw rugs and other things on the floor that can make you trip. What can I do in the bedroom?  Use night lights.  Make sure that you have a light by your bed that is easy to reach.  Do not use any sheets or blankets that are too big for your bed. They should not hang down onto the floor.  Have a firm chair that has side arms. You can use this for support while you get dressed.  Do not have throw rugs and other things on the floor that can make you trip. What can I do in the kitchen?  Clean up any spills right away.  Avoid walking on wet floors.  Keep items that you use a lot in easy-to-reach places.  If you need to reach something above you, use a strong step stool that has a grab bar.  Keep electrical cords out of the way.  Do not use floor polish or wax that makes floors slippery. If  you must use wax, use non-skid floor wax.  Do not have throw rugs and other things on the floor that can make you trip. What can I do with my stairs?  Do not leave any items on the stairs.  Make sure that there are handrails on both sides of the stairs and use them. Fix handrails that are broken or loose. Make sure that handrails are as long as the stairways.  Check any carpeting to make sure that it is firmly attached to the stairs. Fix any carpet that is loose or worn.  Avoid having throw rugs at the top or bottom of the stairs. If you do have throw rugs, attach them to the floor with carpet tape.  Make sure that you have a light switch at the top of the stairs and the bottom of the stairs. If you do not have them, ask someone to add them for you. What else can I do to help prevent falls?  Wear shoes that:  Do not have high heels.  Have rubber bottoms.  Are  comfortable and fit you well.  Are closed at the toe. Do not wear sandals.  If you use a stepladder:  Make sure that it is fully opened. Do not climb a closed stepladder.  Make sure that both sides of the stepladder are locked into place.  Ask someone to hold it for you, if possible.  Clearly mark and make sure that you can see:  Any grab bars or handrails.  First and last steps.  Where the edge of each step is.  Use tools that help you move around (mobility aids) if they are needed. These include:  Canes.  Walkers.  Scooters.  Crutches.  Turn on the lights when you go into a dark area. Replace any light bulbs as soon as they burn out.  Set up your furniture so you have a clear path. Avoid moving your furniture around.  If any of your floors are uneven, fix them.  If there are any pets around you, be aware of where they are.  Review your medicines with your doctor. Some medicines can make you feel dizzy. This can increase your chance of falling. Ask your doctor what other things that you can do to help prevent falls. This information is not intended to replace advice given to you by your health care provider. Make sure you discuss any questions you have with your health care provider. Document Released: 12/16/2008 Document Revised: 07/28/2015 Document Reviewed: 03/26/2014 Elsevier Interactive Patient Education  2017 [employer].

## 2019-09-11 NOTE — Progress Notes (Signed)
Subjective:   Beverly Kim is a 86 y.o. female who presents for Medicare Annual (Subsequent) preventive examination.  Review of Systems    No ROS. Medicare Wellness Visit Cardiac Risk Factors include: advanced age (>11men, >92 women);dyslipidemia;diabetes mellitus;family history of premature cardiovascular disease;hypertension     Objective:    Today's Vitals   09/11/19 1510 09/11/19 1527  BP: 120/70   Pulse: 73   Temp: 98.4 F (36.9 C)   SpO2: 97%   Weight: 159 lb 12.8 oz (72.5 kg)   Height: 5\' 2"  (1.575 m)   PainSc: 0-No pain 0-No pain   Body mass index is 29.23 kg/m.  Advanced Directives 09/11/2019 07/19/2019 07/19/2019 01/16/2019 08/19/2018 07/19/2017 03/05/2017  Does Patient Have a Medical Advance Directive? No No No No No No No  Does patient want to make changes to medical advance directive? - - - - - No - Patient declined -  Would patient like information on creating a medical advance directive? No - Patient declined No - Guardian declined No - Guardian declined No - Patient declined No - Patient declined - -    Current Medications (verified) Outpatient Encounter Medications as of 09/11/2019  Medication Sig  . albuterol (PROVENTIL HFA;VENTOLIN HFA) 108 (90 Base) MCG/ACT inhaler Inhale 1-2 puffs into the lungs every 6 (six) hours as needed for wheezing or shortness of breath.  Marland Kitchen BIOTIN PO Take 1 tablet by mouth daily.  . bisoprolol (ZEBETA) 5 MG tablet Take 1 tablet by mouth once daily  . Calcium Carb-Cholecalciferol (CALCIUM/VITAMIN D PO) Take 1 tablet by mouth daily.   . calcium carbonate (TUMS - DOSED IN MG ELEMENTAL CALCIUM) 500 MG chewable tablet Chew 1 tablet by mouth daily as needed for indigestion or heartburn.  . cholecalciferol (VITAMIN D3) 25 MCG (1000 UNIT) tablet Take 1,000 Units by mouth daily.  . Fluticasone-Umeclidin-Vilant (TRELEGY ELLIPTA) 100-62.5-25 MCG/INH AEPB Inhale 1 puff into the lungs daily.  . furosemide (LASIX) 40 MG tablet Take 1 tablet (40 mg  total) by mouth daily.  . metolazone (ZAROXOLYN) 2.5 MG tablet TAKE ONE TABLET BY MOUTH THREE TIMES A WEEK (Patient taking differently: Take 2.5 mg by mouth See admin instructions. TAKE ONE TABLET BY MOUTH THREE TIMES A WEEK. Monday, Wednesday, and Saturday)  . Multiple Vitamins-Minerals (CENTRUM SILVER PO) Take 1 tablet by mouth daily.  . ondansetron (ZOFRAN ODT) 4 MG disintegrating tablet Take 1 tablet (4 mg total) by mouth every 8 (eight) hours as needed for nausea or vomiting.  . potassium chloride (KLOR-CON) 10 MEQ tablet Take 1 tablet by mouth on days metolazone are taken (Patient taking differently: Take 10 mEq by mouth See admin instructions. Take 1 tablet by mouth on days metolazone are taken)  . Rivaroxaban (XARELTO) 15 MG TABS tablet Take 1 tablet (15 mg total) by mouth daily.  Marland Kitchen SYNTHROID 88 MCG tablet TAKE 1 TABLET BY MOUTH ONCE DAILY BEFORE BREAKFAST  . vitamin C (ASCORBIC ACID) 500 MG tablet Take 500 mg by mouth daily.   No facility-administered encounter medications on file as of 09/11/2019.    Allergies (verified) Adhesive [tape], Ace inhibitors, Atorvastatin, Clindamycin, Codeine, Latex, Shellfish allergy, Simvastatin, and Penicillins   History: Past Medical History:  Diagnosis Date  . Atrial fibrillation ([place]) 08/16/2016   observed on PPM interrogation, asymptomatic, chad2vasc score is 4.  will consider anticoagulation  . CHF (congestive heart failure) ([place])   . Colon polyps   . Coronary artery disease   . Diverticulosis   . Dyspnea   .  GERD (gastroesophageal reflux disease)   . Hearing loss of both ears   . Hepatic cyst   . Hiatal hernia   . Hyperlipidemia   . Hypertension   . Hypothyroidism   . Low back pain   . Mitral valve disorders(424.0)   . Other left bundle branch block   . Palpitations   . URI (upper respiratory infection)    Past Surgical History:  Procedure Laterality Date  . CARDIAC CATHETERIZATION  04-18-01  . CARDIOVERSION N/A 01/16/2019    Procedure: CARDIOVERSION;  Surgeon: Josue Hector, MD;  Location[facility] ENDOSCOPY;  Service: Cardiovascular;  Laterality: N/A;  . COLONOSCOPY    . ERCP N/A 07/21/2019   Procedure: ENDOSCOPIC RETROGRADE CHOLANGIOPANCREATOGRAPHY (ERCP);  Surgeon: Milus Banister, MD;  Location: [facility] ENDOSCOPY;  Service: Endoscopy;  Laterality: N/A;  . HERNIA REPAIR    . PACEMAKER IMPLANT Left 05/15/2016   SJM Assirity MRI dual chamber PPM implanted by Dr Rayann Heman for CHB  . REMOVAL OF STONES  07/21/2019   Procedure: REMOVAL OF STONES;  Surgeon: Milus Banister, MD;  Location: [facility] ENDOSCOPY;  Service: Endoscopy;;  . [name]  07/21/2019   Procedure: [name];  Surgeon: Milus Banister, MD;  Location: [facility] ENDOSCOPY;  Service: Endoscopy;;  . THYROIDECTOMY    . TONSILLECTOMY AND ADENOIDECTOMY    . TOTAL ABDOMINAL HYSTERECTOMY W/ BILATERAL SALPINGOOPHORECTOMY     Family History  Problem Relation Age of Onset  . Coronary artery disease Other        family hx of 1st degree relative <50  . Diabetes Other        family hx of  . Other Other        cardiovascular disorder family hx of  . Other Other        neurological disorder family hx of  . Other Other        respiratory disease family hx of  . Heart attack Daughter   . Heart Problems Other        all children  . Sudden death Son   . Heart disease Brother   . Heart disease Sister   . Colon cancer Neg Hx   . Colon polyps Neg Hx    Social History   Socioeconomic History  . Marital status: Widowed    Spouse name: Not on file  . Number of children: 4  . Years of education: Not on file  . Highest education level: Not on file  Occupational History  . Occupation: retired  Tobacco Use  . Smoking status: Never Smoker  . Smokeless tobacco: Never Used  Vaping Use  . Vaping Use: Never used  Substance and Sexual Activity  . Alcohol use: No  . Drug use: No  . Sexual activity: Never  Other Topics Concern  . Not on file  Social History Narrative    . Not on file   Social Determinants of Health   Financial Resource Strain: Low Risk   . Difficulty of Paying Living Expenses: Not hard at all  Food Insecurity: No Food Insecurity  . Worried About Charity fundraiser in the Last Year: Never true  . Ran Out of Food in the Last Year: Never true  Transportation Needs: No Transportation Needs  . Lack of Transportation (Medical): No  . Lack of Transportation (Non-Medical): No  Physical Activity:   . Days of Exercise per Week:   . Minutes of Exercise per Session:   Stress: No Stress Concern Present  . Feeling  of Stress : Not at all  Social Connections: Moderately Integrated  . Frequency of Communication with Friends and Family: More than three times a week  . Frequency of Social Gatherings with Friends and Family: More than three times a week  . Attends Religious Services: More than 4 times per year  . Active Member of Clubs or Organizations: Yes  . Attends Archivist Meetings: More than 4 times per year  . Marital Status: Widowed    Tobacco Counseling Counseling given: No   Clinical Intake:  Pre-visit preparation completed: Yes  Pain : No/denies pain Pain Score: 0-No pain     BMI - recorded: 29.23 Nutritional Status: BMI 25 -29 Overweight Nutritional Risks: None Diabetes: Yes CBG done?: No Did pt. bring in CBG monitor from home?: No  How often do you need to have someone help you when you read instructions, pamphlets, or other written materials from your doctor or pharmacy?: 1 - Never What is the last grade level you completed in school?: N/A  Diabetic? yes  Interpreter Needed?: No  Information entered by :: Emerly Prak N. Alya Smaltz, LPN   Activities of Daily Living In your present state of health, do you have any difficulty performing the following activities: 09/11/2019 07/19/2019  Hearing? [place]? N -  Difficulty concentrating or making decisions? N -  Walking or climbing stairs? Y -  Dressing or  bathing? N -  Doing errands, shopping? Y N  Preparing Food and eating ? N -  Using the Toilet? N -  In the past six months, have you accidently leaked urine? N -  Do you have problems with loss of bowel control? N -  Managing your Medications? N -  Managing your Finances? N -  Housekeeping or managing your Housekeeping? N -  Some recent data might be hidden    Patient Care Team: [name], MD as PCP - General (Internal Medicine) Minus Breeding, MD as PCP - Cardiology (Cardiology) Minus Breeding, MD as Consulting Physician (Cardiology) Tanda Rockers, MD as Consulting Physician (Pulmonary Disease)  Indicate any recent Medical Services you may have received from other than [facility] providers in the past year (date may be approximate).     Assessment:   This is a routine wellness examination for [place].  Hearing/Vision screen No exam data present  Dietary issues and exercise activities discussed: Current Exercise Habits: Home exercise routine, Type of exercise: walking, Time (Minutes): 20, Frequency (Times/Week): 3, Weekly Exercise (Minutes/Week): 60, Intensity: Mild, Exercise limited by: cardiac condition(s);orthopedic condition(s)  Goals    .  Patient Stated      Stay as healthy and as independent possible. Worship God, enjoy life and family.    .  Patient Stated (pt-stated)      Wants to be able to walk without using a cane or walker.      Depression Screen PHQ 2/9 Scores 09/11/2019 08/19/2018 07/19/2017 01/15/2017 03/16/2016 01/03/2015 01/03/2015  PHQ - 2 Score 0 0 0 0 0 0 0  PHQ- 9 Score - - 3 - - - -    Fall Risk Fall Risk  09/11/2019 08/19/2018 07/19/2017 01/15/2017 03/16/2016  Falls in the past year? 0 1 No No No  Number falls in past yr: 0 1 - - -  Injury with Fall? 0 1 - - -  Risk for fall due to : Impaired balance/gait Impaired vision Impaired balance/gait;Impaired mobility - -  Follow up Falls evaluation completed Education provided;Falls prevention discussed - -  -  Any stairs in or around the home? No  If so, are there any without handrails? No  Home free of loose throw rugs in walkways, pet beds, electrical cords, etc? Yes  Adequate lighting in your home to reduce risk of falls? Yes   ASSISTIVE DEVICES UTILIZED TO PREVENT FALLS:  Life alert? No  Use of a cane, walker or w/c? Yes  Grab bars in the bathroom? No  Shower chair or bench in shower? Yes  Elevated toilet seat or a handicapped toilet? No   TIMED UP AND GO:  Was the test performed? No .  Length of time to ambulate 10 feet: 0 sec.   Gait unsteady with use of assistive device, provider informed and education provided.  (Patient is sitting in a wheelchair; not able to evaluate her gait at this time)  Cognitive Function: MMSE - Mini Mental State Exam 09/11/2019 07/19/2017 01/15/2017  Not completed: Unable to complete - -  Orientation to time - 5 5  Orientation to Place - 5 5  Registration - 3 3  Attention/ Calculation - 4 4  Recall - 1 1  Language- name 2 objects - 2 2  Language- repeat - 1 1  Language- follow 3 step command - 3 3  Language- read & follow direction - 1 1  Write a sentence - 1 1  Copy design - 1 1  Total score - 27 27        Immunizations Immunization History  Administered Date(s) Administered  . Influenza Split 12/25/2010, 12/25/2011  . Influenza Whole 03/05/2005, 12/13/2008  . Influenza, High Dose Seasonal PF 12/16/2012, 01/15/2017  . PFIZER SARS-COV-2 Vaccination 05/03/2019, 06/03/2019  . Pneumococcal Polysaccharide-23 03/05/2002, 08/12/2007  . Td 03/06/2003  . Zoster 08/12/2007    TDAP status: Due, Education has been provided regarding the importance of this vaccine. Advised may receive this vaccine at local pharmacy or Health Dept. Aware to provide a copy of the vaccination record if obtained from local pharmacy or Health Dept. Verbalized acceptance and understanding. Flu Vaccine status: Declined, Education has been provided regarding the  importance of this vaccine but patient still declined. Advised may receive this vaccine at local pharmacy or Health Dept. Aware to provide a copy of the vaccination record if obtained from local pharmacy or Health Dept. Verbalized acceptance and understanding. Pneumococcal vaccine status: Declined,  Education has been provided regarding the importance of this vaccine but patient still declined. Advised may receive this vaccine at local pharmacy or Health Dept. Aware to provide a copy of the vaccination record if obtained from local pharmacy or Health Dept. Verbalized acceptance and understanding.  Covid-19 vaccine status: Completed vaccines  Qualifies for Shingles Vaccine? Yes   Zostavax completed Yes   Shingrix Completed?: No.    Education has been provided regarding the importance of this vaccine. Patient has been advised to call insurance company to determine out of pocket expense if they have not yet received this vaccine. Advised may also receive vaccine at local pharmacy or Health Dept. Verbalized acceptance and understanding.  Screening Tests Health Maintenance  Topic Date Due  . FOOT EXAM  Never done  . URINE MICROALBUMIN  Never done  . DEXA SCAN  Never done  . PNA vac Low Risk Adult (2 of 2 - PCV13) 08/11/2008  . TETANUS/TDAP  03/05/2013  . INFLUENZA VACCINE  10/04/2019  . HEMOGLOBIN A1C  02/04/2020  . OPHTHALMOLOGY EXAM  02/11/2020  . COVID-19 Vaccine  Completed    Health Maintenance  Health  Maintenance Due  Topic Date Due  . FOOT EXAM  Never done  . URINE MICROALBUMIN  Never done  . DEXA SCAN  Never done  . PNA vac Low Risk Adult (2 of 2 - PCV13) 08/11/2008  . TETANUS/TDAP  03/05/2013    Colorectal cancer screening: No longer required.  Mammogram status: No longer required.   Bone Density: never done  Lung Cancer Screening: (Low Dose CT Chest recommended if Age 23-80 years, 30 pack-year currently smoking OR have quit w/in 15years.) does not qualify.   Lung Cancer  Screening Referral: no  Additional Screening:  Hepatitis C Screening: does not qualify; Completed no  Vision Screening: Recommended annual ophthalmology exams for early detection of glaucoma and other disorders of the eye. Is the patient up to date with their annual eye exam?  Yes  Who is the provider or what is the name of the office in which the patient attends annual eye exams? [facility] If pt is not established with a provider, would they like to be referred to a provider to establish care? No .   Dental Screening: Recommended annual dental exams for proper oral hygiene  Community Resource Referral / Chronic Care Management: CRR required this visit?  No   CCM required this visit?  No      Plan:     I have personally reviewed and noted the following in the patient's chart:   . Medical and social history . Use of alcohol, tobacco or illicit drugs  . Current medications and supplements . Functional ability and status . Nutritional status . Physical activity . Advanced directives . List of other physicians . Hospitalizations, surgeries, and ER visits in previous 12 months . Vitals . Screenings to include cognitive, depression, and falls . Referrals and appointments  In addition, I have reviewed and discussed with patient certain preventive protocols, quality metrics, and best practice recommendations. A written personalized care plan for preventive services as well as general preventive health recommendations were provided to patient.     Sheral Flow, LPN   07/07/979   Nurse Notes:  Patient was not feeling well during her AWV.  Patient complained of having a heavy head and fogginess.   Patient also stated that she feels that she has loss a lot of weight. Weight was obtained. Not able to complete MMSE due to issues in the office.

## 2019-09-12 DIAGNOSIS — R2681 Unsteadiness on feet: Secondary | ICD-10-CM | POA: Insufficient documentation

## 2019-09-12 DIAGNOSIS — L301 Dyshidrosis [pompholyx]: Secondary | ICD-10-CM | POA: Insufficient documentation

## 2019-09-12 DIAGNOSIS — R42 Dizziness and giddiness: Secondary | ICD-10-CM | POA: Insufficient documentation

## 2019-09-12 NOTE — Assessment & Plan Note (Signed)
Chronic Very unsteady with ambulating - stumbling and having to hold on when walking Related to vertigo with heavy feeling in head CT of head May need to see neuro

## 2019-09-12 NOTE — Assessment & Plan Note (Signed)
Acute Rash appears to be dyshidrotic eczema Continue CerVe cream - can use anywhere Triamcinolone cream BID prn for rash  Reassured this is not related to her diabetes and is nothing serious Call if no improvement

## 2019-09-12 NOTE — Assessment & Plan Note (Signed)
New problem - has been going on for a while Associated with heavy feeling in head, worse with head movements, unsteady gait Possible BPPV, but is more constant at this time - given risk factors concern for possible cva causing symptoms - currently on xarelto , but this is chronic -- will check CT of head  ( has PPM and not sure if she can have an MRI) Will refer to ENT Consider PT and neuro referral depending on above Will hold off on meclizine given sedating properties

## 2019-09-16 ENCOUNTER — Ambulatory Visit (INDEPENDENT_AMBULATORY_CARE_PROVIDER_SITE_OTHER)
Admission: RE | Admit: 2019-09-16 | Discharge: 2019-09-16 | Disposition: A | Discharge status: Home / Self Care | Payer: PPO | Source: Ambulatory Visit | Attending: Internal Medicine | Admitting: Internal Medicine

## 2019-09-16 ENCOUNTER — Other Ambulatory Visit: Payer: Self-pay

## 2019-09-16 DIAGNOSIS — G3281 Cerebellar ataxia in diseases classified elsewhere: Secondary | ICD-10-CM | POA: Diagnosis not present

## 2019-09-16 DIAGNOSIS — R42 Dizziness and giddiness: Secondary | ICD-10-CM

## 2019-09-16 DIAGNOSIS — I6782 Cerebral ischemia: Secondary | ICD-10-CM | POA: Diagnosis not present

## 2019-09-16 DIAGNOSIS — R2681 Unsteadiness on feet: Secondary | ICD-10-CM

## 2019-09-16 DIAGNOSIS — R270 Ataxia, unspecified: Secondary | ICD-10-CM | POA: Diagnosis not present

## 2019-09-16 DIAGNOSIS — G319 Degenerative disease of nervous system, unspecified: Secondary | ICD-10-CM | POA: Diagnosis not present

## 2019-09-17 ENCOUNTER — Ambulatory Visit: Payer: PPO | Admitting: Cardiology

## 2019-09-17 ENCOUNTER — Encounter: Payer: Self-pay | Admitting: Cardiology

## 2019-09-17 DIAGNOSIS — Z01810 Encounter for preprocedural cardiovascular examination: Secondary | ICD-10-CM | POA: Insufficient documentation

## 2019-09-17 DIAGNOSIS — I10 Essential (primary) hypertension: Secondary | ICD-10-CM | POA: Diagnosis not present

## 2019-09-17 DIAGNOSIS — Z950 Presence of cardiac pacemaker: Secondary | ICD-10-CM | POA: Diagnosis not present

## 2019-09-17 DIAGNOSIS — I340 Nonrheumatic mitral (valve) insufficiency: Secondary | ICD-10-CM | POA: Diagnosis not present

## 2019-09-17 DIAGNOSIS — I2510 Atherosclerotic heart disease of native coronary artery without angina pectoris: Secondary | ICD-10-CM

## 2019-09-17 DIAGNOSIS — I4819 Other persistent atrial fibrillation: Secondary | ICD-10-CM | POA: Diagnosis not present

## 2019-09-17 DIAGNOSIS — I5032 Chronic diastolic (congestive) heart failure: Secondary | ICD-10-CM

## 2019-09-17 NOTE — Patient Instructions (Addendum)
Medication Instructions:  No changes *If you need a refill on your cardiac medications before your next appointment, please call your pharmacy*   Lab Work: Not needed If you have labs (blood work) drawn today and your tests are completely normal, you will receive your results only by: Marland Kitchen MyChart Message (if you have MyChart) OR . A paper copy in the mail If you have any lab test that is abnormal or we need to change your treatment, we will call you to review the results.   Testing/Procedures: WILL BE SCHEDULE AT [place] Your physician has requested that you have an echocardiogram. Echocardiography is a painless test that uses sound waves to create images of your heart. It provides your doctor with information about the size and shape of your heart and how well your heart's chambers and valves are working. This procedure takes approximately one hour. There are no restrictions for this procedure.     Follow-Up: At [facility], you and your health needs are our priority.  As part of our continuing mission to provide you with exceptional heart care, we have created designated Provider Care Teams.  These Care Teams include your primary Cardiologist (physician) and Advanced Practice Providers (APPs -  Physician Assistants and Nurse Practitioners) who all work together to provide you with the care you need, when you need it.  We recommend signing up for the patient portal called "MyChart".  Sign up information is provided on this After Visit Summary.  MyChart is used to connect with patients for Virtual Visits (Telemedicine).  Patients are able to view lab/test results, encounter notes, upcoming appointments, etc.  Non-urgent messages can be sent to your provider as well.   To learn more about what you can do with MyChart, go to [email or web address].    Your next appointment:   5 to 6 month(s)   Dec or JAN [id number]  The format for your next appointment:   In  Person  Provider:   Glenetta Hew, MD   Other Instructions  okay to have surgery  - May hold Xarelto  2 to 3  days prior to surgery if echo is normal

## 2019-09-17 NOTE — Progress Notes (Signed)
Primary Care Provider: [name], MD Cardiologist: Glenetta Hew, MD Electrophysiologist: None  Clinic Note: Chief Complaint  Patient presents with  . Follow-up    Establish new cardiologist-transferring care   HPI:    Beverly Kim is a 86 y.o. female with a PMH notable for PERSISTENT A. FIB on Xarelto (history of SS S-AV block s/p PPM), CAD, chronic HFpEF, HTN and HLD along with hypothyroidism who presents today for hospital follow-up and to establish new cardiology care.   November 2015 --> echo for shortness of breath demonstrated reduced EF of 40 to 50%.  March 2018--presented with fatigue and dyspnea, found to have third-degree AVB --> CRT-P (b/c reduced LVEF)  F/u Echo 06/2016 (see below) - EF 60-65%.   Seen by Dr. Percival Spanish in September 2020 --> noted intermittent episodes of dyspnea lasting a few seconds usually lying down in bed.  Not every night.  Was thought it may have something to do with GERD/reflux.  No chest pain or pressure. ->  PPM interrogation indicated 1% A. fib.  Plan was to avoid DOAC unless there was increased burden, because of history of bleed.  PPM interrogation on 12/03/2018 indicated persistent A. fib since 11/16/2018 -> noticing increased fatigue and palpitations. She was told to stop aspirin and started on Eliquis (had to be reminded to start Eliquis at A. fib clinic December 05, 2018) Eliquis was then changed to Xarelto 15 mg daily (CrCl  <50 ) for ease of taking, and complaints of headache/nausea since starting Eliquis. Marland Kitchen---> DCCV 01/04/2019  Seen in A. fib clinic April 9-noted fatigue and dyspnea over the last few weeks.  Had run out of metolazone.  Noted abdominal pedal edema.  Have been in A. fib since March.  Noted PND orthopnea worse than usual.  Did not notice palpitations --> discussed DCCV versus antiarrhythmic.  Did not want to pursue DCCV and apprehensive about starting antiarrhythmic.  Determined that they would continue with rate control  and DOAC. ->  Encouraged to pick up her metolazone.  Beverly Kim was last seen in [place] by [place]. [place], [place] on April 16,2021. ->  During the visit she indicated that she was feeling better overall with better breathing and more energy despite remaining in A. fib.--Essentially getting used to it.  Continued 5 mg sotalol and 15 mg Xarelto (adjusted for CrCl <50 mL/min)  This was perhaps third A. fib clinic visit clinic  Recent Hospitalizations:   07/31/2019: Return due to abdominal pain, went home prior to being seen because of long wait.  5/16-20/2021: Presented with acute abdominal pain.  Has known history of cholelithiasis with similar symptoms before, did not want to undergo cholecystectomy in the past. -->  CT of the abdomen showed cholecystitis with probably distended CBD at 12 mm (choledocholithiasis/cholecystitis with sepsis)-> both GI and general surgery were consulted. -->   ERCP on 07/21/2019 with complete stone removal via sphincterotomy and balloon extraction.  Per report both the cystic duct and gallbladder were filled with aggressive stones partially opacified -> f recommended IV antibiotics overnight and then convert to oral Cipro and Flagyl to complete 7 days.  Following ERCP, she tolerated her diet well.  No plan for operative intervention at that time.    Resumed Xarelto on 07/26/2019  Had AKI on CKD--antihypertensives and diuretic initially held: On DC, told she could resume her home Lasix and metolazone.  (Did receive Lasix for apparent volume overload).  Maintained on bisoprolol for rate control.  Was stable from A. fib standpoint.  Reviewed  CV studies:    The following studies were reviewed today: (if available, images/films reviewed: From Epic Chart or Care Everywhere) . TTE 06/27/2016:: Moderate basal-septal LVH, EF 60 to 65%.  No R WMA.  GR 1 DD.  Mild aortic valve calcification.  No AS.  Trivial MR.--Indicated improved EF compared to prior study  in 2015. Marland Kitchen Myoview 01/30/2012: No ischemia or infarction. . Cardiac Cath 04/18/2001: EF 60 to 65%.  2+ MR.  Upper LM takeoff with 90 degree bend just inside the ostium.  50-70% mid LAD after D1.  D1 is 30%.  Disability wraps the apex and is free of disease.  LCx is mostly large OM branch.  RCA provides PDA and PL branches and Free of disease. -->  Presumably evaluated by Myoview that was nonischemic, therefore no PCI performed.  CHA2DS2-VASc score 5: Age-65, female, HTN, aortic plaque-3.   Device interrogation since January 16, 2019: A paced 27%, V paced 97%.  Mode switch 49% with AF burden 49%.  Automatic mode switch on   Interval History:   Beverly Kim presents here today to establish new cardiology care.  Her daughter is a patient of mine, and she wanted to "keep in the family ".  She was a little bit frustrated because of all the A. fib clinic visits without seeing Dr. Percival Spanish. Currently, she has been dealing with this gallbladder issue and is still a bit weak as a result of it.  She had been having little fatigue in lightheadedness and was told to reduce her bisoprolol dose, and says that she feels a little better with this reduced dose.  Her blood pressure is not as low.  She has issues with balance and has an unsteady gait.--She says that she feels a little "wobbly ".  She is fine walking around on flat ground, but if she goes upstairs she will get short of breath on occasion.  A lot of this is because is difficult for her to climb the stairs.  Now she has abdominal pain when she walks.  She still has her little intermittent episodes of shortness of breath that seem to be chronic for her and she says that usually when she takes albuterol it helps.  She is aware that she may very well need to go forward with gallbladder surgery and knows that there is a question of holding Xarelto and she needs a preop evaluation. She does not really have any chest pain or pressure with rest or  exertion.  She intermittently notes the fatigue when she is in A. fib, but has not really been noticing that.  Probably because she has been just feeling worn out after her recent hospitalization.  CV Review of Symptoms (Summary) Cardiovascular ROS: positive for - dyspnea on exertion, edema, orthopnea, shortness of breath and Dyspnea is really with walking upstairs or carrying something, not with routine activity.  Able to do some house chores such as cleaning etc. probably not true orthopnea, she still has the 10 to 15-second short of breath spells when she lies down.  No PND. negative for - chest pain, irregular heartbeat, palpitations, rapid heart rate or Although she may have lightheadedness and poor balance, it is not really dizziness, syncope or near syncope.  No TIA or amaurosis fugax.  No claudication.  No melena, hematochezia, hematuria or epistaxis.  The patient does not have symptoms concerning for COVID-19 infection (fever, chills, cough, or new shortness of breath).  The patient is practicing social distancing & Masking.   Completed her Pfizer COVID-19 vaccines  REVIEWED OF SYSTEMS   Review of Systems  Constitutional: Positive for malaise/fatigue (Off and on, but not right now). Negative for weight loss.  HENT: Negative for nosebleeds.   Respiratory: Positive for cough.   Gastrointestinal: Positive for abdominal pain (Starting to feel better, but still hurts). Negative for blood in stool, melena, nausea and vomiting.  Genitourinary: Negative for dysuria and hematuria.  Musculoskeletal: Positive for joint pain (Arthritis pains). Negative for falls.  Neurological: Positive for dizziness (More associated with poor balance, vertigo) and headaches (Migraine-like headaches). Negative for focal weakness.  Psychiatric/Behavioral: The patient has insomnia (Does not sleep well.).    Currently being evaluated for vertigo.  There was thought that maybe she had a TIA.  Just completed head CT  yesterday.  I have reviewed and (if needed) personally updated the patient's problem list, medications, allergies, past medical and surgical history, social and family history.   PAST MEDICAL HISTORY   Past Medical History:  Diagnosis Date  . Cardiac pacemaker 05/2016   Sick sinus syndrome/complete heart block  . Chronic heart failure with preserved ejection fraction (HFpEF) ([place])   . Colon polyps   . Coronary artery disease 04/2001   Cardiac Cath 04/18/2001: EF 60 to 65%.  2+ MR.  Upper LM takeoff with 90 degree bend just inside the ostium.  50-70% mid LAD after D1.  D1 is 30%.  Disability wraps the apex and is free of disease.  LCx is mostly large OM branch.  RCA provides PDA and PL branches and Free of disease. -->  Presumably evaluated by Myoview that was nonischemic, therefore no PCI performed.  . Diverticulosis   . Dyspnea   . GERD (gastroesophageal reflux disease)   . Hearing loss of both ears   . Hepatic cyst   . Hiatal hernia   . Hyperlipidemia   . Hypertension   . Hypothyroidism   . Low back pain   . Other left bundle branch block   . PAF (paroxysmal atrial fibrillation) ([place]) 08/16/2016   observed on PPM interrogation, asymptomatic, chad2vasc score is 5.  Bisoprolol for rate control, Xarelto for anticoagulation.  Marland Kitchen URI (upper respiratory infection)     PAST SURGICAL HISTORY   Past Surgical History:  Procedure Laterality Date  . CARDIAC CATHETERIZATION  04-18-01  . CARDIOVERSION N/A 01/16/2019   Procedure: CARDIOVERSION;  Surgeon: Josue Hector, MD;  Location: [facility] ENDOSCOPY;  Service: Cardiovascular;  Laterality: N/A;  . COLONOSCOPY    . ERCP N/A 07/21/2019   Procedure: ENDOSCOPIC RETROGRADE CHOLANGIOPANCREATOGRAPHY (ERCP);  Surgeon: Milus Banister, MD;  Location: [facility] ENDOSCOPY;  Service: Endoscopy;  Laterality: N/A;  . HERNIA REPAIR    . PACEMAKER IMPLANT Left 05/15/2016   SJM Assirity MRI dual chamber PPM implanted by Dr Rayann Heman for CHB  . REMOVAL OF STONES   07/21/2019   Procedure: REMOVAL OF STONES;  Surgeon: Milus Banister, MD;  Location: [facility] ENDOSCOPY;  Service: Endoscopy;;  . [name]  07/21/2019   Procedure: [name];  Surgeon: Milus Banister, MD;  Location: [facility] ENDOSCOPY;  Service: Endoscopy;;  . THYROIDECTOMY    . TONSILLECTOMY AND ADENOIDECTOMY    . TOTAL ABDOMINAL HYSTERECTOMY W/ BILATERAL SALPINGOOPHORECTOMY      01/16/2019-DCCV  MEDICATIONS/ALLERGIES   Current Meds  Medication Sig  . albuterol (PROVENTIL HFA;VENTOLIN HFA) 108 (90 Base) MCG/ACT inhaler Inhale 1-2 puffs into the lungs every 6 (six) hours as needed for wheezing  or shortness of breath.  Marland Kitchen BIOTIN PO Take 1 tablet by mouth daily.  . bisoprolol (ZEBETA) 5 MG tablet Take 1 tablet (5 mg total) by mouth daily.  . Calcium Carb-Cholecalciferol (CALCIUM/VITAMIN D PO) Take 1 tablet by mouth daily.   . calcium carbonate (TUMS - DOSED IN MG ELEMENTAL CALCIUM) 500 MG chewable tablet Chew 1 tablet by mouth daily as needed for indigestion or heartburn.  . cholecalciferol (VITAMIN D3) 25 MCG (1000 UNIT) tablet Take 1,000 Units by mouth daily.  . Fluticasone-Umeclidin-Vilant (TRELEGY ELLIPTA) 100-62.5-25 MCG/INH AEPB Inhale 1 puff into the lungs daily.  . furosemide (LASIX) 40 MG tablet Take 1 tablet (40 mg total) by mouth daily.  . metolazone (ZAROXOLYN) 2.5 MG tablet TAKE ONE TABLET BY MOUTH THREE TIMES A WEEK (Patient taking differently: Take 2.5 mg by mouth See admin instructions. TAKE ONE TABLET BY MOUTH THREE TIMES A WEEK. Monday, Wednesday, and Saturday)  . Multiple Vitamins-Minerals (CENTRUM SILVER PO) Take 1 tablet by mouth daily.  . ondansetron (ZOFRAN ODT) 4 MG disintegrating tablet Take 1 tablet (4 mg total) by mouth every 8 (eight) hours as needed for nausea or vomiting.  . potassium chloride (KLOR-CON) 10 MEQ tablet Take 1 tablet by mouth on days metolazone are taken (Patient taking differently: Take 10 mEq by mouth See admin instructions. Take 1 tablet by mouth  on days metolazone are taken)  . Rivaroxaban (XARELTO) 15 MG TABS tablet Take 1 tablet (15 mg total) by mouth daily.  Marland Kitchen SYNTHROID 88 MCG tablet TAKE 1 TABLET BY MOUTH ONCE DAILY BEFORE BREAKFAST  . triamcinolone cream (KENALOG) 0.1 % Apply 1 application topically 2 (two) times daily as needed.  . vitamin C (ASCORBIC ACID) 500 MG tablet Take 500 mg by mouth daily.    Allergies  Allergen Reactions  . Adhesive [Tape] Itching, Dermatitis, Rash and Other (See Comments)    Blisters and "skin bubbles"  . Ace Inhibitors Cough  . Atorvastatin Other (See Comments)    REACTION: Reaction not known  . Clindamycin Other (See Comments)    Unknown  . Codeine Other (See Comments)    hallucinations   . Latex Other (See Comments)    blisters  . Shellfish Allergy Other (See Comments)    "gallbladder attack"  . Simvastatin Other (See Comments)    REACTION: fatigue  . Penicillins Rash    Has patient had a PCN reaction causing immediate rash, facial/tongue/throat swelling, SOB or lightheadedness with hypotension: Unknown Has patient had a PCN reaction causing severe rash involving mucus membranes or skin necrosis: Unknown Has patient had a PCN reaction that required hospitalization: Unknown Has patient had a PCN reaction occurring within the last 10 years: No If all of the above answers are "NO", then may proceed with Cephalosporin use.     SOCIAL HISTORY/FAMILY HISTORY   Social History   Tobacco Use  . Smoking status: Never Smoker  . Smokeless tobacco: Never Used  Vaping Use  . Vaping Use: Never used  Substance Use Topics  . Alcohol use: No  . Drug use: No   Social History   Social History Narrative  . Not on file   Family History  Problem Relation Age of Onset  . Coronary artery disease Other        family hx of 1st degree relative <50  . Diabetes Other        family hx of  . Other Other        cardiovascular disorder family hx of  .  Other Other        neurological disorder  family hx of  . Other Other        respiratory disease family hx of  . Heart attack Daughter   . Heart Problems Other        all children  . Sudden death Son   . Heart disease Brother   . Heart disease Sister   . Colon cancer Neg Hx   . Colon polyps Neg Hx     OBJCTIVE -PE, EKG, labs   Wt Readings from Last 3 Encounters:  09/17/19 165 lb (74.8 kg)  09/11/19 159 lb (72.1 kg)  09/11/19 159 lb 12.8 oz (72.5 kg)    Physical Exam: BP 111/61   Pulse 70   Temp (!) 97.1 F (36.2 C)   Ht 5' 2.5" (1.588 m)   Wt 165 lb (74.8 kg)   SpO2 97%   BMI 29.70 kg/m  Physical Exam Constitutional:      General: She is not in acute distress.    Appearance: Normal appearance. She is obese. She is not ill-appearing.     Comments: Appears very healthy for stated age.  Well-groomed  HENT:     Head: Normocephalic and atraumatic.  Eyes:     Extraocular Movements: Extraocular movements intact.  Neck:     Vascular: No carotid bruit.  Cardiovascular:     Rate and Rhythm: Normal rate and regular rhythm.  No extrasystoles are present.    Chest Wall: PMI is not displaced (Difficult to palpate).     Pulses: Normal pulses.     Heart sounds: Normal heart sounds and S1 normal. No murmur heard.  No friction rub. No gallop.      Comments: Somewhat split S2 because of paced beat Pulmonary:     Effort: Pulmonary effort is normal. No respiratory distress.     Breath sounds: Normal breath sounds.  Chest:     Chest wall: No tenderness.  Abdominal:     General: Bowel sounds are normal.     Palpations: Abdomen is soft.     Comments: I did not palpate hard enough to feel for HSM.  She had some mild right upper quadrant tenderness.  Musculoskeletal:        General: Swelling (Trivial bilateral edema) present.     Cervical back: Normal range of motion and neck supple. No rigidity.     Comments: She does have a slow unsteady gait  Skin:    General: Skin is warm and dry.  Neurological:     General: No  focal deficit present.     Mental Status: She is alert and oriented to person, place, and time.     Comments: Hard of hearing  Psychiatric:        Mood and Affect: Mood normal.        Behavior: Behavior normal.        Thought Content: Thought content normal.        Judgment: Judgment normal.      Adult ECG Report  Rate: 70 ;  Rhythm: Unable to determine underlying atrial rhythm, but is V paced.;   Narrative Interpretation: V-paced rhythm  Recent Labs: Should be due to get labs checked by PCP soon. Lab Results  Component Value Date   CHOL 205 (H) 08/06/2018   HDL 41.70 08/06/2018   LDLCALC 132 (H) 08/06/2018   LDLDIRECT 163.3 01/30/2012   TRIG 156.0 (H) 08/06/2018   CHOLHDL 5 08/06/2018   Lab  Results  Component Value Date   CREATININE 1.08 08/05/2019   BUN 34 (H) 08/05/2019   NA 135 08/05/2019   K 3.6 08/05/2019   CL 94 (L) 08/05/2019   CO2 32 08/05/2019   Lab Results  Component Value Date   TSH 3.20 08/05/2019    ASSESSMENT/PLAN    Problem List Items Addressed This Visit    Persistent atrial fibrillation CHA2DS2-VASc score =5; Xarelto 15 mg (Chronic)    Based on discussions with the atrial fibrillation clinic, the plan for now is continued rate control.  Would appear that she is in and out of A. fib at least with a burden of 49% based on patient interrogation.  She has declined cardioversion in the past as well as AAT (antiarrhythmic therapy).  This is not an unreasonable plan given the potential side effects of medications and her age..  Plan: Continue bisoprolol 5 mg (she feels better with this), and renal dosed rivaroxaban/Xarelto at 15 mg daily.  With this may, some mild exacerbation of her HFpEF. ->  We discussed intermittent dosing of metolazone along with her Lasix.   From a preop standpoint, she would have to hold the Xarelto 2 days prior to her surgery.  Clearly since she does have PAF that is persistent, there is always a chance of stroke, but the  benefits of having her cholecystectomy outweigh the risks.       Relevant Orders   EKG 12-Lead (Completed)   ECHOCARDIOGRAM COMPLETE   Essential hypertension (Chronic)    She is only on bisoprolol now on blood pressure looks pretty good.  With her having some positional dizziness, I would not be more aggressive.  For this reason, I truthfully think that any mild exacerbation of her HFpEF is because of being in A. fib or dietary discretion.  We talked about trying to avoid excess salt in her diet.      CAD (coronary artery disease) (Chronic)    In 2003, she had a cath that showed 70% stenosis in the mid LAD.  Most recent Myoview was in 2013, and that was nonischemic.  She has not had any anginal symptoms, and seems to be tolerating intermittent bouts of A. fib without having any angina.  Plan: Continue with bisoprolol,   No aspirin as she is on Xarelto  Theoretically, with her lipid panel, she should be on statin.  However, she is very reluctant to try any other statins.  She has listed allergies/intolerance for simvastatin and atorvastatin.  She also seems to think that somewhere along the line she has tried rosuvastatin.      Relevant Orders   EKG 12-Lead (Completed)   ECHOCARDIOGRAM COMPLETE   Chronic diastolic heart failure (HCC) (Chronic)    Still having intermittent dyspnea which I think may very well be related to intermittent bouts of A. fib.  Long episodes of A. fib which are difficult to determine because of her PPM.  She is on standing dose of furosemide 40 mg daily and is using.  Zaroxolyn. This is based on sliding scale and I told her that she can take it up to 3 times a week--she was unsure of how frequently she could take it.  We are going to reassess her EF with an echocardiogram based on her intermittent bouts of dyspnea.  Also with the plan for potential upcoming gallbladder surgery, a new baseline is warranted.      Relevant Orders   EKG 12-Lead (Completed)     ECHOCARDIOGRAM COMPLETE  Non-rheumatic mitral regurgitation (Chronic)    She had trivial MR noted on her most recent echo in 2018, at do not necessarily hear murmur, partly because a distant heart sounds.  However she has had intermittent episodes of HFpEF, and now has had more frequent A. fib burden.  Would like to reevaluate her valvular function as well as EF.  Plan: Check 2D echo      Relevant Orders   EKG 12-Lead (Completed)   ECHOCARDIOGRAM COMPLETE   Cardiac pacemaker   Preop cardiovascular exam    At present, Mrs. Meroney seems to be doing fairly well.  From a preoperative standpoint, her most notable risk is her age.  From a cardiac standpoint she does have paroxysmal persistent A. fib and is on Xarelto which will have to be held.  She does have previously documented moderate CAD that has not been ischemic.  No active angina. She does have diastolic dysfunction and intermittent episodes of HFpEF, but well controlled. Normal renal function, nondiabetic, and no prior stroke history.  Revised Cardiac Risk Index for cholecystectomy if open would be intermediate-high risk, but for laparoscopic would likely still be low to intermediate risk just simply based on the surgery itself.  Baseline risk is roughly 3 to 6% because of her history of HFpEF.  With her not having any active symptoms of angina, I would not do any ischemic evaluation, however I do plan on checking an echocardiogram to get new baseline.  Provided the echocardiogram does not show any changes, I think no further evaluation would be necessary prior to her surgery.  She would need to hold Xarelto at least 2 days (okay for 3) prior to surgery and then restart once safe postop.  She must understand that there is a risk of stroke during this interval, however she has only recently started DOAC.      Relevant Orders   EKG 12-Lead (Completed)   ECHOCARDIOGRAM COMPLETE       COVID-19 Education: The signs and symptoms  of COVID-19 were discussed with the patient and how to seek care for testing (follow up with PCP or arrange E-visit).   The importance of social distancing and COVID-19 vaccination was discussed today.  I spent a total of 35minutes with the patient. >  50% of the time was spent in direct patient consultation.  Additional time spent with chart review  / charting (studies, outside notes, etc): 20 --> Although she is a return patient for our group, she is a new patient for me.  Since her last general cardiology visit she has had 5 visits of the A. fib clinic and one hospitalization.  She has had a cardioversion.  All this was reviewed and her medical history was updated. Total Time: 46min   Current medicines are reviewed at length with the patient today.  (+/- concerns) questions about holding Xarelto for possible surgery, preop evaluation  Notice: This dictation was prepared with Dragon dictation along with smaller phrase technology. Any transcriptional errors that result from this process are unintentional and may not be corrected upon review.  Patient Instructions / Medication Changes & Studies & Tests Ordered   Patient Instructions  Medication Instructions:  No changes *If you need a refill on your cardiac medications before your next appointment, please call your pharmacy*   Lab Work: Not needed If you have labs (blood work) drawn today and your tests are completely normal, you will receive your results only by: Marland Kitchen MyChart Message (if you have MyChart)  OR . A paper copy in the mail If you have any lab test that is abnormal or we need to change your treatment, we will call you to review the results.   Testing/Procedures: WILL BE SCHEDULE AT [place] Your physician has requested that you have an echocardiogram. Echocardiography is a painless test that uses sound waves to create images of your heart. It provides your doctor with information about the size and  shape of your heart and how well your heart's chambers and valves are working. This procedure takes approximately one hour. There are no restrictions for this procedure.     Follow-Up: At [facility], you and your health needs are our priority.  As part of our continuing mission to provide you with exceptional heart care, we have created designated Provider Care Teams.  These Care Teams include your primary Cardiologist (physician) and Advanced Practice Providers (APPs -  Physician Assistants and Nurse Practitioners) who all work together to provide you with the care you need, when you need it.  We recommend signing up for the patient portal called "MyChart".  Sign up information is provided on this After Visit Summary.  MyChart is used to connect with patients for Virtual Visits (Telemedicine).  Patients are able to view lab/test results, encounter notes, upcoming appointments, etc.  Non-urgent messages can be sent to your provider as well.   To learn more about what you can do with MyChart, go to [email or web address].    Your next appointment:   5 to 6 month(s)   Dec or JAN [id number]  The format for your next appointment:   In Person  Provider:   Glenetta Hew, MD   Other Instructions  okay to have surgery  - May hold Xarelto  2 to 3  days prior to surgery if echo is normal     Studies Ordered:   Orders Placed This Encounter  Procedures  . EKG 12-Lead  . ECHOCARDIOGRAM COMPLETE     Glenetta Hew, M.D., M.S. Interventional Cardiologist   Pager # [phone] Phone # [phone] [address] [place], [place] [address]   Thank you for choosing Heartcare at [facility]!!

## 2019-09-21 ENCOUNTER — Telehealth: Payer: Self-pay | Admitting: Internal Medicine

## 2019-09-21 NOTE — Telephone Encounter (Signed)
Results given and patient has been informed. Copy mailed out to her today per her request.

## 2019-09-21 NOTE — Telephone Encounter (Signed)
Beverly Rail, MD  09/18/2019 9:48 PM EDT     CT of head is normal for age.

## 2019-09-21 NOTE — Telephone Encounter (Signed)
   Patient calling for CT scan reports

## 2019-09-24 ENCOUNTER — Encounter: Payer: Self-pay | Admitting: Cardiology

## 2019-09-24 NOTE — Assessment & Plan Note (Signed)
Still having intermittent dyspnea which I think may very well be related to intermittent bouts of A. fib.  Long episodes of A. fib which are difficult to determine because of her PPM.  She is on standing dose of furosemide 40 mg daily and is using.  Zaroxolyn. This is based on sliding scale and I told her that she can take it up to 3 times a week--she was unsure of how frequently she could take it.  We are going to reassess her EF with an echocardiogram based on her intermittent bouts of dyspnea.  Also with the plan for potential upcoming gallbladder surgery, a new baseline is warranted.

## 2019-09-24 NOTE — Assessment & Plan Note (Signed)
She is only on bisoprolol now on blood pressure looks pretty good.  With her having some positional dizziness, I would not be more aggressive.  For this reason, I truthfully think that any mild exacerbation of her HFpEF is because of being in A. fib or dietary discretion.  We talked about trying to avoid excess salt in her diet.

## 2019-09-24 NOTE — Assessment & Plan Note (Addendum)
Based on discussions with the atrial fibrillation clinic, the plan for now is continued rate control.  Would appear that she is in and out of A. fib at least with a burden of 49% based on patient interrogation.  She has declined cardioversion in the past as well as AAT (antiarrhythmic therapy).  This is not an unreasonable plan given the potential side effects of medications and her age..  Plan: Continue bisoprolol 5 mg (she feels better with this), and renal dosed rivaroxaban/Xarelto at 15 mg daily.  With this may, some mild exacerbation of her HFpEF. ->  We discussed intermittent dosing of metolazone along with her Lasix.   From a preop standpoint, she would have to hold the Xarelto 2 days prior to her surgery.  Clearly since she does have PAF that is persistent, there is always a chance of stroke, but the benefits of having her cholecystectomy outweigh the risks.

## 2019-09-24 NOTE — Assessment & Plan Note (Signed)
In 2003, she had a cath that showed 70% stenosis in the mid LAD.  Most recent Myoview was in 2013, and that was nonischemic.  She has not had any anginal symptoms, and seems to be tolerating intermittent bouts of A. fib without having any angina.  Plan: Continue with bisoprolol,   No aspirin as she is on Xarelto  Theoretically, with her lipid panel, she should be on statin.  However, she is very reluctant to try any other statins.  She has listed allergies/intolerance for simvastatin and atorvastatin.  She also seems to think that somewhere along the line she has tried rosuvastatin.

## 2019-09-24 NOTE — Assessment & Plan Note (Signed)
She had trivial MR noted on her most recent echo in 2018, at do not necessarily hear murmur, partly because a distant heart sounds.  However she has had intermittent episodes of HFpEF, and now has had more frequent A. fib burden.  Would like to reevaluate her valvular function as well as EF.  Plan: Check 2D echo

## 2019-09-24 NOTE — Assessment & Plan Note (Signed)
At present, Beverly Kim seems to be doing fairly well.  From a preoperative standpoint, her most notable risk is her age.  From a cardiac standpoint she does have paroxysmal persistent A. fib and is on Xarelto which will have to be held.  She does have previously documented moderate CAD that has not been ischemic.  No active angina. She does have diastolic dysfunction and intermittent episodes of HFpEF, but well controlled. Normal renal function, nondiabetic, and no prior stroke history.  Revised Cardiac Risk Index for cholecystectomy if open would be intermediate-high risk, but for laparoscopic would likely still be low to intermediate risk just simply based on the surgery itself.  Baseline risk is roughly 3 to 6% because of her history of HFpEF.  With her not having any active symptoms of angina, I would not do any ischemic evaluation, however I do plan on checking an echocardiogram to get new baseline.  Provided the echocardiogram does not show any changes, I think no further evaluation would be necessary prior to her surgery.  She would need to hold Xarelto at least 2 days (okay for 3) prior to surgery and then restart once safe postop.  She must understand that there is a risk of stroke during this interval, however she has only recently started DOAC.

## 2019-10-02 ENCOUNTER — Telehealth: Payer: Self-pay

## 2019-10-02 NOTE — Telephone Encounter (Signed)
New message   Per patient is asking for another Provider verse Dr. Wilburn Cornelia is out of her network - works for [facility].   The patient is requesting Dr. Lucia Gaskins office # [phone]

## 2019-10-04 HISTORY — PX: TRANSTHORACIC ECHOCARDIOGRAM: SHX275

## 2019-10-07 ENCOUNTER — Other Ambulatory Visit: Payer: Self-pay

## 2019-10-07 ENCOUNTER — Ambulatory Visit (HOSPITAL_COMMUNITY): Payer: PPO | Attending: Cardiovascular Disease

## 2019-10-07 DIAGNOSIS — I2510 Atherosclerotic heart disease of native coronary artery without angina pectoris: Secondary | ICD-10-CM | POA: Diagnosis not present

## 2019-10-07 DIAGNOSIS — I4819 Other persistent atrial fibrillation: Secondary | ICD-10-CM | POA: Diagnosis not present

## 2019-10-07 DIAGNOSIS — I5032 Chronic diastolic (congestive) heart failure: Secondary | ICD-10-CM | POA: Diagnosis not present

## 2019-10-07 DIAGNOSIS — I340 Nonrheumatic mitral (valve) insufficiency: Secondary | ICD-10-CM | POA: Diagnosis not present

## 2019-10-07 DIAGNOSIS — Z01810 Encounter for preprocedural cardiovascular examination: Secondary | ICD-10-CM | POA: Insufficient documentation

## 2019-10-07 LAB — ECHOCARDIOGRAM COMPLETE
MV M vel: 4.74 m/s
MV Peak grad: 89.9 mmHg
Radius: 0.5 cm
S' Lateral: 2.9 cm

## 2019-10-08 ENCOUNTER — Telehealth: Payer: Self-pay | Admitting: *Deleted

## 2019-10-08 NOTE — Telephone Encounter (Signed)
-----   Message from Leonie Man, MD sent at 10/08/2019  2:45 AM EDT ----- Phillip Heal results shows normal pump function with an ejection fraction of 6065%.  There is clear evidence of concentric hypertrophy and unable to really assess diastolic function.  It would appear that this diastolic function is not that bad because of the left atrium is only mildly dilated.  Moderate mitral and tricuspid regurgitation noted.  Normal aortic valve.  Overall relatively stable echocardiogram.  Mitral regurgitation is a little bit more than it had been.  Overall, the echo was stable enough for her to undergo cholecystectomy.  No further evaluation needed for preop evaluation.   Glenetta Hew, MD

## 2019-10-08 NOTE — Telephone Encounter (Signed)
Left message to call back .  Concerning echo  result

## 2019-10-09 ENCOUNTER — Other Ambulatory Visit (HOSPITAL_COMMUNITY): Payer: PPO

## 2019-10-09 NOTE — Telephone Encounter (Signed)
The patient's daughter Mickel Baas has been notified of the result and verbalized understanding.  All questions (if any) were answered.  she was  Informed of the  Process of  Cardiac clearance - patient has upcoming consultation with surgery.   Raiford Simmonds, RN 10/09/2019 4:53 PM

## 2019-11-02 ENCOUNTER — Other Ambulatory Visit: Payer: Self-pay | Admitting: General Surgery

## 2019-11-02 DIAGNOSIS — K8011 Calculus of gallbladder with chronic cholecystitis with obstruction: Secondary | ICD-10-CM | POA: Diagnosis not present

## 2019-11-02 NOTE — Progress Notes (Unsigned)
Patient is a 86 year old female that saw Dr. Excell Seltzer 2 years ago after several attacks of symptomatic cholelithiasis.  He felt that she had also had chronic cholecystitis.  She was reluctant to have her gallbladder removed at that point due to her age and health problems.  However, this summer she required admission to the hospital and was found to have obstructing gallstones.  She required a ERCP.  Since that time, she has not felt back to normal.  She still remains extremely tired and has a sensation of feeling like something in her right upper quadrant is "going to pop."  She has a chronic nagging discomfort there and baseline nausea that is present almost all the time despite having a low-fat diet and eating lots of fruits and vegetables.  She denies fevers, chills, or jaundice.  She is on Xarelto for atrial fibrillation.  She is followed by Dr. Ellyn Hack of cardiology.  She lives some of the time at her house and some of the time with her daughter.  In that way, she gets some space and independence, and some time without having a lot of responsibility.  She is able to stay with one of her children postoperatively.

## 2019-11-13 ENCOUNTER — Encounter (INDEPENDENT_AMBULATORY_CARE_PROVIDER_SITE_OTHER): Payer: Self-pay | Admitting: Otolaryngology

## 2019-11-13 ENCOUNTER — Other Ambulatory Visit: Payer: Self-pay

## 2019-11-13 ENCOUNTER — Ambulatory Visit (INDEPENDENT_AMBULATORY_CARE_PROVIDER_SITE_OTHER): Payer: PPO | Admitting: Otolaryngology

## 2019-11-13 VITALS — Temp 97.7°F

## 2019-11-13 DIAGNOSIS — H903 Sensorineural hearing loss, bilateral: Secondary | ICD-10-CM

## 2019-11-13 DIAGNOSIS — R42 Dizziness and giddiness: Secondary | ICD-10-CM

## 2019-11-13 NOTE — Progress Notes (Signed)
HPI: Beverly Kim is a 86 y.o. female who presents is referred by Dr. Quay Burow for evaluation of chronic dizziness that she has had for the past couple months.  She had a CT scan of her head performed in July because of ataxia and questionable stroke but this was normal with no intracranial abnormalities noted.  I reviewed this in the sinuses and middle ear spaces were clear.  She does complain of itching in her ears.  She wears bilateral hearing aids and has very poor hearing in both ears to the degree that she has difficulty hearing even with the hearing aids.  She has tried several different hearing aids in the past.  Past Medical History:  Diagnosis Date  . Cardiac pacemaker 05/2016   Sick sinus syndrome/complete heart block  . Chronic heart failure with preserved ejection fraction (HFpEF) ([place])   . Colon polyps   . Coronary artery disease 04/2001   Cardiac Cath 04/18/2001: EF 60 to 65%.  2+ MR.  Upper LM takeoff with 90 degree bend just inside the ostium.  50-70% mid LAD after D1.  D1 is 30%.  Disability wraps the apex and is free of disease.  LCx is mostly large OM branch.  RCA provides PDA and PL branches and Free of disease. -->  Presumably evaluated by Myoview that was nonischemic, therefore no PCI performed.  . Diverticulosis   . Dyspnea   . GERD (gastroesophageal reflux disease)   . Hearing loss of both ears   . Hepatic cyst   . Hiatal hernia   . Hyperlipidemia   . Hypertension   . Hypothyroidism   . Low back pain   . Other left bundle branch block   . PAF (paroxysmal atrial fibrillation) ([place]) 08/16/2016   observed on PPM interrogation, asymptomatic, chad2vasc score is 5.  Bisoprolol for rate control, Xarelto for anticoagulation.  Marland Kitchen URI (upper respiratory infection)    Past Surgical History:  Procedure Laterality Date  . CARDIAC CATHETERIZATION  04-18-01  . CARDIOVERSION N/A 01/16/2019   Procedure: CARDIOVERSION;  Surgeon: Josue Hector, MD;  Location[facility] ENDOSCOPY;   Service: Cardiovascular;  Laterality: N/A;  . COLONOSCOPY    . ERCP N/A 07/21/2019   Procedure: ENDOSCOPIC RETROGRADE CHOLANGIOPANCREATOGRAPHY (ERCP);  Surgeon: Milus Banister, MD;  Location: [facility] ENDOSCOPY;  Service: Endoscopy;  Laterality: N/A;  . HERNIA REPAIR    . PACEMAKER IMPLANT Left 05/15/2016   SJM Assirity MRI dual chamber PPM implanted by Dr Rayann Heman for CHB  . REMOVAL OF STONES  07/21/2019   Procedure: REMOVAL OF STONES;  Surgeon: Milus Banister, MD;  Location: [facility] ENDOSCOPY;  Service: Endoscopy;;  . [name]  07/21/2019   Procedure: [name];  Surgeon: Milus Banister, MD;  Location: [facility] ENDOSCOPY;  Service: Endoscopy;;  . THYROIDECTOMY    . TONSILLECTOMY AND ADENOIDECTOMY    . TOTAL ABDOMINAL HYSTERECTOMY W/ BILATERAL SALPINGOOPHORECTOMY     Social History   Socioeconomic History  . Marital status: Widowed    Spouse name: Not on file  . Number of children: 4  . Years of education: Not on file  . Highest education level: Not on file  Occupational History  . Occupation: retired  Tobacco Use  . Smoking status: Never Smoker  . Smokeless tobacco: Never Used  Vaping Use  . Vaping Use: Never used  Substance and Sexual Activity  . Alcohol use: No  . Drug use: No  . Sexual activity: Never  Other Topics Concern  . Not on file  Social History Narrative  . Not on file   Social Determinants of Health   Financial Resource Strain: Low Risk   . Difficulty of Paying Living Expenses: Not hard at all  Food Insecurity: No Food Insecurity  . Worried About Charity fundraiser in the Last Year: Never true  . Ran Out of Food in the Last Year: Never true  Transportation Needs: No Transportation Needs  . Lack of Transportation (Medical): No  . Lack of Transportation (Non-Medical): No  Physical Activity:   . Days of Exercise per Week: Not on file  . Minutes of Exercise per Session: Not on file  Stress: No Stress Concern Present  . Feeling of Stress : Not at all  Social  Connections: Moderately Integrated  . Frequency of Communication with Friends and Family: More than three times a week  . Frequency of Social Gatherings with Friends and Family: More than three times a week  . Attends Religious Services: More than 4 times per year  . Active Member of Clubs or Organizations: Yes  . Attends Archivist Meetings: More than 4 times per year  . Marital Status: Widowed   Family History  Problem Relation Age of Onset  . Coronary artery disease Other        family hx of 1st degree relative <50  . Diabetes Other        family hx of  . Other Other        cardiovascular disorder family hx of  . Other Other        neurological disorder family hx of  . Other Other        respiratory disease family hx of  . Heart attack Daughter   . Heart Problems Other        all children  . Sudden death Son   . Heart disease Brother   . Heart disease Sister   . Colon cancer Neg Hx   . Colon polyps Neg Hx    Allergies  Allergen Reactions  . Adhesive [Tape] Itching, Dermatitis, Rash and Other (See Comments)    Blisters and "skin bubbles"  . Ace Inhibitors Cough  . Atorvastatin Other (See Comments)    REACTION: Reaction not known  . Clindamycin Other (See Comments)    Unknown  . Codeine Other (See Comments)    hallucinations   . Latex Other (See Comments)    blisters  . Shellfish Allergy Other (See Comments)    "gallbladder attack"  . Simvastatin Other (See Comments)    REACTION: fatigue  . Penicillins Rash    Has patient had a PCN reaction causing immediate rash, facial/tongue/throat swelling, SOB or lightheadedness with hypotension: Unknown Has patient had a PCN reaction causing severe rash involving mucus membranes or skin necrosis: Unknown Has patient had a PCN reaction that required hospitalization: Unknown Has patient had a PCN reaction occurring within the last 10 years: No If all of the above answers are "NO", then may proceed with Cephalosporin  use.    Prior to Admission medications   Medication Sig Start Date End Date Taking? Authorizing Provider  albuterol (PROVENTIL HFA;VENTOLIN HFA) 108 (90 Base) MCG/ACT inhaler Inhale 1-2 puffs into the lungs every 6 (six) hours as needed for wheezing or shortness of breath. 03/05/17  Yes Sherwood Gambler, MD  BIOTIN PO Take 1 tablet by mouth daily.   Yes [provider]  bisoprolol (ZEBETA) 5 MG tablet Take 1 tablet (5 mg total) by mouth daily. 09/11/19  Yes [name], MD  Calcium Carb-Cholecalciferol (CALCIUM/VITAMIN D PO) Take 1 tablet by mouth daily.    Yes [provider]  calcium carbonate (TUMS - DOSED IN MG ELEMENTAL CALCIUM) 500 MG chewable tablet Chew 1 tablet by mouth daily as needed for indigestion or heartburn.   Yes [provider]  cholecalciferol (VITAMIN D3) 25 MCG (1000 UNIT) tablet Take 1,000 Units by mouth daily.   Yes [provider]  Fluticasone-Umeclidin-Vilant (TRELEGY ELLIPTA) 100-62.5-25 MCG/INH AEPB Inhale 1 puff into the lungs daily. 02/05/19  Yes Mannam, Praveen, MD  furosemide (LASIX) 40 MG tablet Take 1 tablet (40 mg total) by mouth daily. 02/06/19  Yes Burns, Claudina Lick, MD  metolazone (ZAROXOLYN) 2.5 MG tablet TAKE ONE TABLET BY MOUTH THREE TIMES A WEEK Patient taking differently: Take 2.5 mg by mouth See admin instructions. TAKE ONE TABLET BY MOUTH THREE TIMES A WEEK. Monday, Wednesday, and Saturday 06/12/19  Yes Fenton, Clint R, PA  Multiple Vitamins-Minerals (CENTRUM SILVER PO) Take 1 tablet by mouth daily.   Yes [provider]  ondansetron (ZOFRAN ODT) 4 MG disintegrating tablet Take 1 tablet (4 mg total) by mouth every 8 (eight) hours as needed for nausea or vomiting. 08/05/19  Yes Burns, Claudina Lick, MD  potassium chloride (KLOR-CON) 10 MEQ tablet Take 1 tablet by mouth on days metolazone are taken Patient taking differently: Take 10 mEq by mouth See admin instructions. Take 1 tablet by mouth on days metolazone are taken 06/19/19   Yes Fenton, Clint R, PA  Rivaroxaban (XARELTO) 15 MG TABS tablet Take 1 tablet (15 mg total) by mouth daily. 07/26/19  Yes Kc, Maren Beach, MD  SYNTHROID 88 MCG tablet TAKE 1 TABLET BY MOUTH ONCE DAILY BEFORE BREAKFAST 09/11/19  Yes Burns, Claudina Lick, MD  triamcinolone cream (KENALOG) 0.1 % Apply 1 application topically 2 (two) times daily as needed. 09/11/19  Yes Burns, Claudina Lick, MD  vitamin C (ASCORBIC ACID) 500 MG tablet Take 500 mg by mouth daily.   Yes [provider]     Positive ROS: Otherwise negative  All other systems have been reviewed and were otherwise negative with the exception of those mentioned in the HPI and as above.  Physical Exam: Constitutional: Alert, well-appearing, no acute distress Ears: External ears without lesions or tenderness. Ear canals are clear bilaterally with no wax buildup and no evidence of external otitis.  Skin is dry.  TMs are clear bilaterally.  Good mobility on pneumatic otoscopy.  She has very poor hearing with tuning fork testing in both ears.  Dix-Hallpike testing was negative for clinical evidence of BPPV. Nasal: External nose without lesions. Septum straight. Clear nasal passages bilaterally. Oral: Lips and gums without lesions. Tongue and palate mucosa without lesions. Posterior oropharynx clear. Neck: No palpable adenopathy or masses Respiratory: Breathing comfortably  Skin: No facial/neck lesions or rash noted.  Procedures  Assessment: Patient with very poor hearing as well as poor vestibular function which go hand-in-hand. Itching in the ears.  Plan: Concerning her balance problems recommended vestibular rehab and have [facility].  Concerning her hearing problems she will follow-up with our audiologist concerning repeat audiologic testing and their recommendations concerning the best hearing aids for her. Wrote a prescription for fluocinolone oil 0.01% to use 3 to 4 drops in the ear twice daily x4 days as needed  itching in the ears   Radene Journey, MD   CC:

## 2019-12-03 ENCOUNTER — Other Ambulatory Visit: Payer: Self-pay | Admitting: Internal Medicine

## 2019-12-03 ENCOUNTER — Other Ambulatory Visit: Payer: Self-pay | Admitting: Cardiology

## 2019-12-08 ENCOUNTER — Encounter: Payer: Self-pay | Admitting: Internal Medicine

## 2019-12-08 ENCOUNTER — Inpatient Hospital Stay (HOSPITAL_COMMUNITY): Admission: RE | Admit: 2019-12-08 | Payer: PPO | Source: Ambulatory Visit

## 2019-12-08 NOTE — Progress Notes (Unsigned)
PERIOPERATIVE PRESCRIPTION FOR IMPLANTED CARDIAC DEVICE PROGRAMMING  Patient Information: Name:  CYRAH MCLAMB  DOB:  08/18/1924  MRN:  [id number]  {TIP - You do not have to delete this tip  -  Copy the info from the staff message sent by the PAT staff  then press F2 here and paste the information using CTL - V on the next line :[id number]}   [facility]anned Procedure: Laparoscopic Cholecystectomy With Possible Cholangiogram   Surgeon: Stark Klein, MD  Date of Procedure: 12/15/19  Cautery will be used.  Position during surgery: Supine   Please send documentation back to:  [facility] (Fax # [phone]  ce Information:  Clinic EP Physician:  [name], MD   Device Type:  Pacemaker Manufacturer and Phone #:  [employer]: [phone] Pacemaker Dependent?:  Yes.   Date of Last Device Check:  08/23/19 Normal Device Function?:  Yes.    Electrophysiologist's Recommendations:   Have magnet available.  Provide continuous ECG monitoring when magnet is used or reprogramming is to be performed.   Procedure will likely interfere with device function.  Device should be programmed:  Tachy therapies disabled and Asynchronous pacing during procedure and returned to normal programming after procedure  Per Device Clinic Standing Orders, Simone Curia, RN  5:12 PM 12/08/2019

## 2019-12-08 NOTE — Progress Notes (Signed)
Emailed device clinic requesting orders for upcoming procedure 12/15/19.

## 2019-12-08 NOTE — Progress Notes (Signed)
Dionne Milo, [place] device rep notified of upcoming surgery 12/15/19.

## 2019-12-08 NOTE — Pre-Procedure Instructions (Addendum)
Your procedure is scheduled on Tuesday, October 12, from 07:30 AM- 09:00 AM.  Report to [facility] Main Entrance "A" at 05:30 A.M., and check in at the Admitting office.  Call this number if you have problems the morning of surgery:  [phone]  Call [phone] if you have any questions prior to your surgery date Monday-Friday 8am-4pm.    Remember:  Do not eat after midnight the night before your surgery.  You may drink clear liquids until 04:30 AM the morning of your surgery.   Clear liquids allowed are: Water, Non-Citrus Juices (without pulp), Carbonated Beverages, Clear Tea, Black Coffee Only, and Gatorade.    Take these medicines the morning of surgery with A SIP OF WATER: bisoprolol (ZEBETA) SYNTHROID  IF NEEDED: acetaminophen (TYLENOL) albuterol (PROVENTIL HFA;VENTOLIN HFA) inhaler- bring with you the day of surgery    *STOP Rivaroxaban (XARELTO) 3 days prior to surgery.  As of today, STOP taking any Aspirin (unless otherwise instructed by your surgeon) Aleve, Naproxen, Ibuprofen, Motrin, Advil, Goody's, BC's, all herbal medications, fish oil, and all vitamins.        The Morning of Surgery:               Do not wear jewelry, make up, or nail polish.            Do not wear lotions, powders, perfumes, or deodorant.            Do not shave 48 hours prior to surgery.              Do not bring valuables to the hospital.            [facility] is not responsible for any belongings or valuables.  Do NOT Smoke (Tobacco/Vaping) or drink Alcohol 24 hours prior to your procedure.  If you use a CPAP at night, you may bring all equipment for your overnight stay.   Contacts, glasses, dentures or bridgework may not be worn into surgery.      For patients admitted to the hospital, discharge time will be determined by your treatment team.   Patients discharged the day of surgery will not be allowed to drive home, and someone needs to stay with them for 24 hours.    Special  instructions:   [place]- Preparing For Surgery  Before surgery, you can play an important role. Because skin is not sterile, your skin needs to be as free of germs as possible. You can reduce the number of germs on your skin by washing with CHG (chlorahexidine gluconate) Soap before surgery.  CHG is an antiseptic cleaner which kills germs and bonds with the skin to continue killing germs even after washing.    Oral Hygiene is also important to reduce your risk of infection.  Remember - BRUSH YOUR TEETH THE MORNING OF SURGERY WITH YOUR REGULAR TOOTHPASTE  Please do not use if you have an allergy to CHG or antibacterial soaps. If your skin becomes reddened/irritated stop using the CHG.  Do not shave (including legs and underarms) for at least 48 hours prior to first CHG shower. It is OK to shave your face.  Please follow these instructions carefully.   1. Shower the NIGHT BEFORE SURGERY and the MORNING OF SURGERY with CHG Soap.   2. If you chose to wash your hair, wash your hair first as usual with your normal shampoo.  3. After you shampoo, rinse your hair and body thoroughly to remove the shampoo.  4. Use CHG as you would any other liquid soap. You can apply CHG directly to the skin and wash gently with a scrungie or a clean washcloth.   5. Apply the CHG Soap to your body ONLY FROM THE NECK DOWN.  Do not use on open wounds or open sores. Avoid contact with your eyes, ears, mouth and genitals (private parts). Wash Face and genitals (private parts)  with your normal soap.   6. Wash thoroughly, paying special attention to the area where your surgery will be performed.  7. Thoroughly rinse your body with warm water from the neck down.  8. DO NOT shower/wash with your normal soap after using and rinsing off the CHG Soap.  9. Pat yourself dry with a CLEAN TOWEL.  10. Wear CLEAN PAJAMAS to bed the night before surgery  11. Place CLEAN SHEETS on your bed the night of your first shower and  DO NOT SLEEP WITH PETS.   Day of Surgery: SHOWER Wear Clean/Comfortable clothing the morning of surgery Do not apply any deodorants/lotions.   Remember to brush your teeth WITH YOUR REGULAR TOOTHPASTE.   Please read over the following fact sheets that you were given.

## 2019-12-09 ENCOUNTER — Telehealth: Payer: Self-pay | Admitting: Internal Medicine

## 2019-12-09 ENCOUNTER — Encounter (HOSPITAL_COMMUNITY)
Admission: RE | Admit: 2019-12-09 | Discharge: 2019-12-09 | Disposition: A | Discharge status: Home / Self Care | Payer: PPO | Source: Ambulatory Visit | Attending: General Surgery | Admitting: General Surgery

## 2019-12-09 ENCOUNTER — Encounter (HOSPITAL_COMMUNITY): Payer: Self-pay

## 2019-12-09 ENCOUNTER — Other Ambulatory Visit: Payer: Self-pay

## 2019-12-09 DIAGNOSIS — Z01812 Encounter for preprocedural laboratory examination: Secondary | ICD-10-CM | POA: Diagnosis not present

## 2019-12-09 LAB — CBC WITH DIFFERENTIAL/PLATELET
Abs Immature Granulocytes: 0.04 10*3/uL (ref 0.00–0.07)
Basophils Absolute: 0 10*3/uL (ref 0.0–0.1)
Basophils Relative: 1 %
Eosinophils Absolute: 0.3 10*3/uL (ref 0.0–0.5)
Eosinophils Relative: 4 %
HCT: 38 % (ref 36.0–46.0)
Hemoglobin: 11.8 g/dL — ABNORMAL LOW (ref 12.0–15.0)
Immature Granulocytes: 1 %
Lymphocytes Relative: 27 %
Lymphs Abs: 2.1 10*3/uL (ref 0.7–4.0)
MCH: 29.6 pg (ref 26.0–34.0)
MCHC: 31.1 g/dL (ref 30.0–36.0)
MCV: 95.5 fL (ref 80.0–100.0)
Monocytes Absolute: 0.7 10*3/uL (ref 0.1–1.0)
Monocytes Relative: 9 %
Neutro Abs: 4.6 10*3/uL (ref 1.7–7.7)
Neutrophils Relative %: 58 %
Platelets: 196 10*3/uL (ref 150–400)
RBC: 3.98 MIL/uL (ref 3.87–5.11)
RDW: 14.9 % (ref 11.5–15.5)
WBC: 7.7 10*3/uL (ref 4.0–10.5)
nRBC: 0 % (ref 0.0–0.2)

## 2019-12-09 LAB — COMPREHENSIVE METABOLIC PANEL
ALT: 17 U/L (ref 0–44)
AST: 21 U/L (ref 15–41)
Albumin: 3.5 g/dL (ref 3.5–5.0)
Alkaline Phosphatase: 66 U/L (ref 38–126)
Anion gap: 13 (ref 5–15)
BUN: 29 mg/dL — ABNORMAL HIGH (ref 8–23)
CO2: 25 mmol/L (ref 22–32)
Calcium: 9.6 mg/dL (ref 8.9–10.3)
Chloride: 99 mmol/L (ref 98–111)
Creatinine, Ser: 1.17 mg/dL — ABNORMAL HIGH (ref 0.44–1.00)
GFR calc non Af Amer: 40 mL/min — ABNORMAL LOW (ref >60)
Glucose, Bld: 113 mg/dL — ABNORMAL HIGH (ref 70–99)
Potassium: 3.7 mmol/L (ref 3.5–5.1)
Sodium: 137 mmol/L (ref 135–145)
Total Bilirubin: 1.3 mg/dL — ABNORMAL HIGH (ref 0.3–1.2)
Total Protein: 6.9 g/dL (ref 6.5–8.1)

## 2019-12-09 LAB — GLUCOSE, CAPILLARY: Glucose-Capillary: 115 mg/dL — ABNORMAL HIGH (ref 70–99)

## 2019-12-09 LAB — HEMOGLOBIN A1C
Hgb A1c MFr Bld: 6.6 % — ABNORMAL HIGH (ref 4.8–5.6)
Mean Plasma Glucose: 142.72 mg/dL

## 2019-12-09 LAB — PROTIME-INR
INR: 1.1 (ref 0.8–1.2)
Prothrombin Time: 13.7 seconds (ref 11.4–15.2)

## 2019-12-09 MED ORDER — CLOTRIMAZOLE 1 % EX CREA: 1.0000 "application " | TOPICAL_CREAM | Freq: Two times a day (BID) | CUTANEOUS | 0 refills | Status: DC

## 2019-12-09 NOTE — Telephone Encounter (Signed)
rx sent to [facility].  Call if not improving.  Try to keep area dry - ddry well after bath.

## 2019-12-09 NOTE — Telephone Encounter (Signed)
Spoke with son and info given. 

## 2019-12-09 NOTE — Telephone Encounter (Signed)
Patient went to her to pre - admit for her upcoming surgery on 10/12. Her son , Beverly Kim said it looks as she has a yeast infection under the folds of her lower abnormal. They stated this needs to be treated or they are going to be unable to complete her gallbladder surgery on 10/12.   We have no open appointment for today or the rest of the week.  Son wants to know if something can be called in or if patient can be worked in for an appointment today or tomorrow.   Please follow up with son, Beverly Kim.

## 2019-12-09 NOTE — Progress Notes (Signed)
PCP - [name] Cardiologist - Ellyn Hack  PPM/ICD - Pacemaker ([place]) Device Orders - orders in chart Rep Notified - yes, Beverly Kim is aware.  Please call on DOS if needed.  Chest x-ray - 07-23-19 EKG - 09-18-19 Stress Test - 2013 ECHO - 10-07-19 Cardiac Cath - 04-18-01  DM - Type 2, diet controlled (per patient), does not check sugars  Blood Thinner Instructions: Stop taking Xarelto (3) days prior to surgery   ERAS Protcol - yes, no drink ordered or given   COVID TEST- Friday Oct 8th   Anesthesia review: yes, heart history, pacemaker Patient complains of red rash, that occasionally weeps, and is sometimes painful on her lower abdomen and groin area.  [name] notified, consulted with patient and son Beverly Kim) about getting treated before surgery.  Patient advised to call PCP to get seen before surgery and to get treatment.  [name] to call [name] office to notify them of rash.  Chart sent to anesthesia for follow up.  Patient denies shortness of breath, fever, cough and chest pain at PAT appointment   All instructions explained to the patient, with a verbal understanding of the material. Patient agrees to go over the instructions while at home for a better understanding. Patient also instructed to self quarantine after being tested for COVID-19. The opportunity to ask questions was provided.

## 2019-12-10 NOTE — Telephone Encounter (Signed)
Spoke with patient's son and info given. 

## 2019-12-10 NOTE — Telephone Encounter (Signed)
Ok for both

## 2019-12-10 NOTE — Progress Notes (Signed)
Anesthesia Chart Review:  Follows with cardiology for history of A. fib on Xarelto, SSS-AV block s/p PPM, nonobstructive CAD,  HFpEF, HTN, HLD.  Seen by Dr. Ellyn Hack 09/17/2019 for preop cardiovascular exam.  Per note, "At present, Beverly Kim seems to be doing fairly well.  From a preoperative standpoint, her most notable risk is her age.  From a cardiac standpoint she does have paroxysmal persistent A. fib and is on Xarelto which will have to be held. She does have previously documented moderate CAD that has not been ischemic.  No active angina. She does have diastolic dysfunction and intermittent episodes of HFpEF, but well controlled. Normal renal function, nondiabetic, and no prior stroke history. Revised Cardiac Risk Index for cholecystectomy if open would be intermediate-high risk, but for laparoscopic would likely still be low to intermediate risk just simply based on the surgery itself. Baseline risk is roughly 3 to 6% because of her history of HFpEF. With her not having any active symptoms of angina, I would not do any ischemic evaluation, however I do plan on checking an echocardiogram to get new baseline.  Provided the echocardiogram does not show any changes, I think no further evaluation would be necessary prior to her surgery. She would need to hold Xarelto at least 2 days (okay for 3) prior to surgery and then restart once safe postop.  She must understand that there is a risk of stroke during this interval, however she has only recently started DOAC."  I was called and examined patient during PAT appointment due to report of rash.  On examination, patient has significant intertrigo under her abdominal pannus.  The skin is bright red and appears somewhat macerated.  She says it is quite painful.  She says this extends into her groin.  She says it has been there for some time, she cannot say exactly how long, but has gotten worse recently.  She has not discussed this with her PCP or with surgeon.   She has been applying cornstarch at home without much improvement.  Given the extent of area covered, I advised patient I was unsure if this would potentially preclude surgery.  Regardless, she needs to get this treated soon as possible.  Is causing her significant discomfort.  She is advised to call her primary care Dr. [name] and I also called surgeon's office to make Dr. Barry Dienes aware.  Per notes in epic, Dr. Quay Burow did respond and prescribed clotrimazole ointment.   I spoke with Beverly Kim at Dr. Carley Kim office and she stated that they did follow up with the patient on 12/14/19 and received updated pictures from the patient's son. She said Dr. Barry Dienes reviewed the pictures and felt is was okay to proceed with surgery as planned.   Preop labs reviewed, unremarkable. DMII well controlled with A1c 6.6.  EKG 09/18/19: Ventricular paced rhythm.   Periop device orders 12/08/19: Clinic EP Physician:  [name], MD   Device Type:  Pacemaker Manufacturer and Phone #:  [employer]: [phone] Pacemaker Dependent?:  Yes.   Date of Last Device Check:  08/23/19           Normal Device Function?:  Yes.    Electrophysiologist's Recommendations:   Have magnet available.  Provide continuous ECG monitoring when magnet is used or reprogramming is to be performed.   Procedure will likely interfere with device function.  Device should be programmed:  Tachy therapies disabled and Asynchronous pacing during procedure and returned to normal programming after procedure  TTE 10/07/19: 1. Left ventricular ejection fraction, by estimation, is 60 to 65%. The  left ventricle has normal function. The left ventricle has no regional  wall motion abnormalities. There is severe concentric left ventricular  hypertrophy. Left ventricular diastolic  function could not be evaluated.  2. Right ventricular systolic function is normal. The right ventricular  size is normal. There is normal pulmonary artery  systolic pressure.  3. Left atrial size was mildly dilated.  4. Right atrial size was mildly dilated.  5. The mitral valve is normal in structure. Moderate mitral valve  regurgitation.  6. Tricuspid valve regurgitation is moderate.  7. The aortic valve is normal in structure. Aortic valve regurgitation is  not visualized. No aortic stenosis is present.   [name] [facility] Short Stay Center/Anesthesiology Phone [phone] 12/14/2019 1:57 PM

## 2019-12-10 NOTE — Telephone Encounter (Signed)
Probably does not need both - both are anti-fungals so whatever works best.  The powder is a good thing to use for prevention, but will also treat it.

## 2019-12-10 NOTE — Telephone Encounter (Signed)
Patient son's is calling and states the surgery told them this OTC zeasorb powder they could get. They want to know if they can use the cream and power both.

## 2019-12-11 ENCOUNTER — Other Ambulatory Visit (HOSPITAL_COMMUNITY)
Admission: RE | Admit: 2019-12-11 | Discharge: 2019-12-11 | Disposition: A | Discharge status: Home / Self Care | Payer: PPO | Source: Ambulatory Visit | Attending: General Surgery | Admitting: General Surgery

## 2019-12-11 DIAGNOSIS — Z01818 Encounter for other preprocedural examination: Secondary | ICD-10-CM | POA: Diagnosis not present

## 2019-12-11 DIAGNOSIS — Z20822 Contact with and (suspected) exposure to covid-19: Secondary | ICD-10-CM | POA: Insufficient documentation

## 2019-12-11 LAB — SARS CORONAVIRUS 2 (TAT 6-24 HRS): SARS Coronavirus 2: NEGATIVE

## 2019-12-14 NOTE — H&P (Signed)
Beverly Kim Location: [facility] Surgery Patient #: [id number] DOB: May 27, 1924 Single / Language: Cleophus Molt / Race: White Female   History of Present Illness  The patient is a 86 year old female who presents for a follow-up for Gall stones. The patient is a 86 year old female that saw Dr. Excell Seltzer 2 years ago after several attacks of symptomatic cholelithiasis. He felt that she also had chronic cholecystitis as well. She was reluctant to have her gallbladder removed at that point due to her age and number problems. However earlier this summer she required admission to the hospital and was found to have obstructing gallstones. She required ERCP with sphincterotomy. Since being discharged just around a month ago, she has not felt back to normal. She still remains extremely tired .  She has a sensation of feeling like something in her right upper abdomen is "going to pop." She has a chronic nagging discomfort in that area and baseline nausea despite having a low-fat diet and eating lots of fruits and vegetables. She denies fevers, chills, or jaundice.  She is on xarelto for atrial fibrillation and is followed by Dr. Ellyn Hack of cardiology.  Pertinent the time with herself and part of the time with one of her children. She has multiple family members that are able to help take care of her.   Past Surgical History  Appendectomy    Diagnostic Studies History Mammogram  >3 years ago Pap Smear  1-5 years ago >5 years ago  Allergies  Atorvastatin Calcium *ANTIHYPERLIPIDEMICS*  Clindamycin HCl *ANTI-INFECTIVE AGENTS - MISC.*  Codeine Phosphate *ANALGESICS - OPIOID*  Latex Gloves *MEDICAL DEVICES AND SUPPLIES*  Simvastatin *ANTIHYPERLIPIDEMICS*  Penicillin G Pot in Dextrose *PENICILLINS*  ACE Inhibitors  Allergies Reconciled   Medication History Bisoprolol Fumarate (10MG  Tablet, Oral) Active. Furosemide (40MG  Tablet, Oral) Active. MetOLazone (2.5MG  Tablet,  Oral) Active. Synthroid (88MCG Tablet, Oral) Active. Biotin (1MG  Capsule, Oral) Active. Calcium Antacid (500MG  Tablet Chewable, Oral) Active. Vitamin C (500MG  Capsule, Oral) Active. Vitamin D (1000UNIT Tablet, Oral) Active. Medications Reconciled Xarelto (15MG  Tablet, Oral) Active.  Family History Alcohol Abuse  Brother. Heart Disease  Brother, Family Members In [place], Daughter, Father, Mother, Sister, Son.    Review of Systems General Present- Chills. Not Present- Appetite Loss, Fatigue, Fever, Night Sweats, Weight Gain and Weight Loss. All other systems negative  Vitals Weight: 159.4 lb Height: 62in Body Surface Area: 1.74 m Body Mass Index: 29.15 kg/m  Temp.: 97.2F  Pulse: 70 (Regular)  BP: 128/72(Sitting, Left Arm, Standard)       Physical Exam  General Mental Status-Alert. General Appearance-Consistent with stated age. Hydration-Well hydrated. Voice-Normal.  Head and Neck Head-normocephalic, atraumatic with no lesions or palpable masses.  Eye Sclera/Conjunctiva - Bilateral-No scleral icterus.  Chest and Lung Exam Chest and lung exam reveals -quiet, even and easy respiratory effort with no use of accessory muscles. Inspection Chest Wall - Normal. Back - normal.  Breast - Did not examine.  Cardiovascular Cardiovascular examination reveals -normal pedal pulses bilaterally. Note: regular rate and rhythm  Abdomen Inspection-Inspection Normal. Palpation/Percussion Palpation and Percussion of the abdomen reveal - Soft, No Rebound tenderness, No Rigidity (guarding) and No hepatosplenomegaly. Note: mild RUQ tenderness.  Peripheral Vascular Upper Extremity Inspection - Bilateral - Normal - No Clubbing, No Cyanosis, No Edema, Pulses Intact. Lower Extremity Palpation - Edema - Bilateral - No edema - Bilateral.  Neurologic Neurologic evaluation reveals -alert and oriented x 3 with no impairment of recent or remote  memory. Mental Status-Normal.  Musculoskeletal  Global Assessment -Note: no gross deformities.  Normal Exam - Left-Upper Extremity Strength Normal and Lower Extremity Strength Normal. Normal Exam - Right-Upper Extremity Strength Normal and Lower Extremity Strength Normal.  Lymphatic Head & Neck  General Head & Neck Lymphatics: Bilateral - Description - Normal. Axillary  General Axillary Region: Bilateral - Description - Normal. Tenderness - Non Tender.    Assessment & Plan CALCULUS OF GALLBLADDER WITH CHRONIC CHOLECYSTITIS WITH OBSTRUCTION (K80.11) Impression: Given the patient's continued discomfort and daily symptomatology, I do recommend laparoscopic cholecystectomy. I feel that her benefits of surgery outweigh her risks. I did discuss risks including stroke, bleeding, infection and more. I also discussed that she may just have prolonged fatigue and deconditioning. I would consult physical therapy while she is in the hospital.  The surgical procedure was described to the patient in detail. The patient was given educational material. I discussed the incision type and location, the location of the gallbladder, the anatomy of the bile ducts and arteries, and the typical progression of surgery. I discussed the possibility of converting to an open operation. I advised of the risks of bleeding, infection, damage to other structures (such as the bile duct, intestine or liver), bile leak, need for other procedures or surgeries, and post op diarrhea/constipation. We discussed the risk of blood clot. We discussed the recovery period and post operative restrictions. The patient was advised against taking blood thinners the week before surgery. Current Plans You are being scheduled for surgery- Our schedulers will call you.  You should hear from our office's scheduling department within 5 working days about the location, date, and time of surgery. We try to make accommodations for  patient's preferences in scheduling surgery, but sometimes the OR schedule or the surgeon's schedule prevents [place] from making those accommodations.  If you have not heard from our office [phone]) in 5 working days, call the office and ask for your surgeon's nurse.  If you have other questions about your diagnosis, plan, or surgery, call the office and ask for your surgeon's nurse.  Advised patient to stop ASA, anticoagulant, blood thinners, and NSAIDs three (3) days prior to surgery. Pt Education - Laparoscopic Cholecystectomy: gallbladder

## 2019-12-14 NOTE — Anesthesia Preprocedure Evaluation (Addendum)
Anesthesia Evaluation  Patient identified by MRN, date of birth, ID band Patient awake    Reviewed: Allergy & Precautions, NPO status , Patient's Chart, lab work & pertinent test results  History of Anesthesia Complications Negative for: history of anesthetic complications  Airway Mallampati: II  TM Distance: >3 FB Neck ROM: Full    Dental  (+) Dental Advisory Given, Partial Upper, Partial Lower, Loose   Pulmonary asthma ,    Pulmonary exam normal        Cardiovascular hypertension, Pt. on home beta blockers and Pt. on medications + CAD  Normal cardiovascular exam+ dysrhythmias (LBBB) Atrial Fibrillation + pacemaker (for SSS/CHB) + Valvular Problems/Murmurs MR    '21 TTE - EF 60 to 65%. Severe concentric left ventricular hypertrophy. LA and RA were mildly dilated. Moderate MR and TR.  Electrophysiologist's Recommendations: Have magnet available. Provide continuous ECG monitoring when magnet is used or reprogramming is to be performed.  Procedure will likely interfere with device function.  Device should be programmed:  Tachy therapies disabled and Asynchronous pacing during procedure and returned to normal programming after procedure    Neuro/Psych  Hard of hearing  negative psych ROS   GI/Hepatic Neg liver ROS, hiatal hernia, GERD  Controlled,  Endo/Other  diabetes, Type 2Hypothyroidism   Renal/GU Renal disease     Musculoskeletal  (+) Arthritis ,   Abdominal   Peds  Hematology  On xarelto    Anesthesia Other Findings Covid test negative See PAT note regarding cardiac clearance  Reproductive/Obstetrics                          Anesthesia Physical Anesthesia Plan  ASA: III  Anesthesia Plan: General   Post-op Pain Management:    Induction: Intravenous  PONV Risk Score and Plan: 4 or greater and Treatment may vary due to age or medical condition, Ondansetron and Propofol  infusion  Airway Management Planned: Oral ETT  Additional Equipment: None  Intra-op Plan:   Post-operative Plan: Extubation in OR  Informed Consent: I have reviewed the patients History and Physical, chart, labs and discussed the procedure including the risks, benefits and alternatives for the proposed anesthesia with the patient or authorized representative who has indicated his/her understanding and acceptance.     Dental advisory given  Plan Discussed with: CRNA and Anesthesiologist  Anesthesia Plan Comments:       Anesthesia Quick Evaluation

## 2019-12-15 ENCOUNTER — Inpatient Hospital Stay (HOSPITAL_COMMUNITY): Payer: PPO | Admitting: Vascular Surgery

## 2019-12-15 ENCOUNTER — Inpatient Hospital Stay (HOSPITAL_COMMUNITY)
Admission: RE | Admit: 2019-12-15 | Discharge: 2019-12-17 | DRG: 418 | Disposition: A | Discharge status: Home Health | Payer: PPO | Attending: General Surgery | Admitting: General Surgery

## 2019-12-15 ENCOUNTER — Other Ambulatory Visit: Payer: Self-pay

## 2019-12-15 ENCOUNTER — Encounter (HOSPITAL_COMMUNITY): Payer: Self-pay | Admitting: General Surgery

## 2019-12-15 ENCOUNTER — Encounter (HOSPITAL_COMMUNITY)
Admission: RE | Disposition: A | Discharge status: Home Health | Payer: Self-pay | Source: Home / Self Care | Attending: General Surgery

## 2019-12-15 ENCOUNTER — Inpatient Hospital Stay (HOSPITAL_COMMUNITY): Payer: PPO | Admitting: Certified Registered"

## 2019-12-15 DIAGNOSIS — I4891 Unspecified atrial fibrillation: Secondary | ICD-10-CM | POA: Diagnosis not present

## 2019-12-15 DIAGNOSIS — I110 Hypertensive heart disease with heart failure: Secondary | ICD-10-CM | POA: Diagnosis not present

## 2019-12-15 DIAGNOSIS — K8010 Calculus of gallbladder with chronic cholecystitis without obstruction: Secondary | ICD-10-CM | POA: Diagnosis present

## 2019-12-15 DIAGNOSIS — E039 Hypothyroidism, unspecified: Secondary | ICD-10-CM | POA: Diagnosis present

## 2019-12-15 DIAGNOSIS — Z79890 Hormone replacement therapy (postmenopausal): Secondary | ICD-10-CM | POA: Diagnosis not present

## 2019-12-15 DIAGNOSIS — Z8249 Family history of ischemic heart disease and other diseases of the circulatory system: Secondary | ICD-10-CM

## 2019-12-15 DIAGNOSIS — K8011 Calculus of gallbladder with chronic cholecystitis with obstruction: Principal | ICD-10-CM | POA: Diagnosis present

## 2019-12-15 DIAGNOSIS — Z79899 Other long term (current) drug therapy: Secondary | ICD-10-CM

## 2019-12-15 DIAGNOSIS — K8050 Calculus of bile duct without cholangitis or cholecystitis without obstruction: Secondary | ICD-10-CM | POA: Diagnosis present

## 2019-12-15 DIAGNOSIS — Z7901 Long term (current) use of anticoagulants: Secondary | ICD-10-CM | POA: Diagnosis not present

## 2019-12-15 DIAGNOSIS — K8063 Calculus of gallbladder and bile duct with acute cholecystitis with obstruction: Secondary | ICD-10-CM

## 2019-12-15 DIAGNOSIS — I2510 Atherosclerotic heart disease of native coronary artery without angina pectoris: Secondary | ICD-10-CM | POA: Diagnosis not present

## 2019-12-15 DIAGNOSIS — E785 Hyperlipidemia, unspecified: Secondary | ICD-10-CM | POA: Diagnosis present

## 2019-12-15 DIAGNOSIS — I5032 Chronic diastolic (congestive) heart failure: Secondary | ICD-10-CM | POA: Diagnosis not present

## 2019-12-15 DIAGNOSIS — N179 Acute kidney failure, unspecified: Secondary | ICD-10-CM | POA: Diagnosis not present

## 2019-12-15 DIAGNOSIS — Z811 Family history of alcohol abuse and dependence: Secondary | ICD-10-CM | POA: Diagnosis not present

## 2019-12-15 HISTORY — PX: CHOLECYSTECTOMY: SHX55

## 2019-12-15 LAB — GLUCOSE, CAPILLARY
Glucose-Capillary: 129 mg/dL — ABNORMAL HIGH (ref 70–99)
Glucose-Capillary: 143 mg/dL — ABNORMAL HIGH (ref 70–99)

## 2019-12-15 SURGERY — LAPAROSCOPIC CHOLECYSTECTOMY WITH INTRAOPERATIVE CHOLANGIOGRAM
Anesthesia: General | Site: Abdomen

## 2019-12-15 MED ORDER — FLUTICASONE-UMECLIDIN-VILANT 100-62.5-25 MCG/INH IN AEPB: 1.0000 | INHALATION_SPRAY | Freq: Every day | RESPIRATORY_TRACT | Status: DC

## 2019-12-15 MED ORDER — LIDOCAINE-EPINEPHRINE 1 %-1:100000 IJ SOLN: INTRAMUSCULAR | Status: DC | PRN

## 2019-12-15 MED ORDER — ALBUTEROL SULFATE HFA 108 (90 BASE) MCG/ACT IN AERS
1.0000 | INHALATION_SPRAY | Freq: Four times a day (QID) | RESPIRATORY_TRACT | Status: DC | PRN
  Filled 2019-12-15: qty 6.7

## 2019-12-15 MED ORDER — GABAPENTIN 100 MG PO CAPS: 100.0000 mg | ORAL_CAPSULE | Freq: Three times a day (TID) | ORAL | Status: DC

## 2019-12-15 MED ORDER — PROPOFOL 500 MG/50ML IV EMUL
INTRAVENOUS | Status: DC | PRN
  Administered 2019-12-15: 150 ug/kg/min via INTRAVENOUS

## 2019-12-15 MED ORDER — ACETAMINOPHEN 500 MG PO TABS: 1000.0000 mg | ORAL_TABLET | ORAL | Status: AC

## 2019-12-15 MED ORDER — CHLORHEXIDINE GLUCONATE CLOTH 2 % EX PADS: 6.0000 | MEDICATED_PAD | Freq: Once | CUTANEOUS | Status: DC

## 2019-12-15 MED ORDER — ONDANSETRON HCL 4 MG/2ML IJ SOLN
INTRAMUSCULAR | Status: DC | PRN
  Administered 2019-12-15: 4 mg via INTRAVENOUS

## 2019-12-15 MED ORDER — LIDOCAINE-EPINEPHRINE 1 %-1:100000 IJ SOLN
INTRAMUSCULAR | Status: AC
  Filled 2019-12-15: qty 1

## 2019-12-15 MED ORDER — SODIUM CHLORIDE 0.9 % IV SOLN: INTRAVENOUS | Status: DC | PRN

## 2019-12-15 MED ORDER — LEVOTHYROXINE SODIUM 88 MCG PO TABS
88.0000 ug | ORAL_TABLET | Freq: Every day | ORAL | Status: DC
  Administered 2019-12-16 – 2019-12-17 (×2): 88 ug via ORAL
  Filled 2019-12-15 (×2): qty 1

## 2019-12-15 MED ORDER — ONDANSETRON HCL 4 MG/2ML IJ SOLN: 4.0000 mg | Freq: Four times a day (QID) | INTRAMUSCULAR | Status: DC | PRN

## 2019-12-15 MED ORDER — SUGAMMADEX SODIUM 200 MG/2ML IV SOLN
INTRAVENOUS | Status: DC | PRN
  Administered 2019-12-15: 200 mg via INTRAVENOUS

## 2019-12-15 MED ORDER — CLOTRIMAZOLE 1 % EX CREA
1.0000 "application " | TOPICAL_CREAM | Freq: Two times a day (BID) | CUTANEOUS | Status: DC
  Administered 2019-12-15 – 2019-12-17 (×4): 1 via TOPICAL
  Filled 2019-12-15: qty 15

## 2019-12-15 MED ORDER — KETOROLAC TROMETHAMINE 15 MG/ML IJ SOLN
15.0000 mg | Freq: Once | INTRAMUSCULAR | Status: AC | PRN
  Administered 2019-12-15: 15 mg via INTRAVENOUS

## 2019-12-15 MED ORDER — 0.9 % SODIUM CHLORIDE (POUR BTL) OPTIME
TOPICAL | Status: DC | PRN
  Administered 2019-12-15: 1000 mL

## 2019-12-15 MED ORDER — BISOPROLOL FUMARATE 5 MG PO TABS
5.0000 mg | ORAL_TABLET | Freq: Every day | ORAL | Status: DC
  Administered 2019-12-16 – 2019-12-17 (×2): 5 mg via ORAL
  Filled 2019-12-15 (×2): qty 1

## 2019-12-15 MED ORDER — LACTATED RINGERS IV SOLN: INTRAVENOUS | Status: DC

## 2019-12-15 MED ORDER — GABAPENTIN 100 MG PO CAPS
100.0000 mg | ORAL_CAPSULE | Freq: Two times a day (BID) | ORAL | Status: DC
  Administered 2019-12-15 – 2019-12-17 (×4): 100 mg via ORAL
  Filled 2019-12-15 (×4): qty 1

## 2019-12-15 MED ORDER — FENTANYL CITRATE (PF) 100 MCG/2ML IJ SOLN: 25.0000 ug | INTRAMUSCULAR | Status: DC | PRN

## 2019-12-15 MED ORDER — KETAMINE HCL 50 MG/5ML IJ SOSY
PREFILLED_SYRINGE | INTRAMUSCULAR | Status: AC
  Filled 2019-12-15: qty 5

## 2019-12-15 MED ORDER — CHLORHEXIDINE GLUCONATE 0.12 % MT SOLN: 15.0000 mL | Freq: Once | OROMUCOSAL | Status: AC

## 2019-12-15 MED ORDER — ROCURONIUM BROMIDE 10 MG/ML (PF) SYRINGE
PREFILLED_SYRINGE | INTRAVENOUS | Status: DC | PRN
  Administered 2019-12-15: 50 mg via INTRAVENOUS

## 2019-12-15 MED ORDER — KCL-LACTATED RINGERS-D5W 20 MEQ/L IV SOLN
INTRAVENOUS | Status: DC
  Filled 2019-12-15 (×6): qty 1000

## 2019-12-15 MED ORDER — PROPOFOL 10 MG/ML IV BOLUS
INTRAVENOUS | Status: DC | PRN
  Administered 2019-12-15: 70 mg via INTRAVENOUS

## 2019-12-15 MED ORDER — KETOROLAC TROMETHAMINE 15 MG/ML IJ SOLN
INTRAMUSCULAR | Status: AC
  Filled 2019-12-15: qty 1

## 2019-12-15 MED ORDER — PROPOFOL 10 MG/ML IV BOLUS
INTRAVENOUS | Status: AC
  Filled 2019-12-15: qty 20

## 2019-12-15 MED ORDER — CIPROFLOXACIN IN D5W 400 MG/200ML IV SOLN
400.0000 mg | INTRAVENOUS | Status: AC
  Administered 2019-12-15: 400 mg via INTRAVENOUS

## 2019-12-15 MED ORDER — FENTANYL CITRATE (PF) 250 MCG/5ML IJ SOLN
INTRAMUSCULAR | Status: DC | PRN
Start: 2019-12-15 — End: 2019-12-15
  Administered 2019-12-15: 100 ug via INTRAVENOUS
  Administered 2019-12-15: 50 ug via INTRAVENOUS

## 2019-12-15 MED ORDER — ONDANSETRON 4 MG PO TBDP: 4.0000 mg | ORAL_TABLET | Freq: Four times a day (QID) | ORAL | Status: DC | PRN

## 2019-12-15 MED ORDER — CHLORHEXIDINE GLUCONATE 0.12 % MT SOLN
OROMUCOSAL | Status: AC
  Administered 2019-12-15: 15 mL via OROMUCOSAL
  Filled 2019-12-15: qty 15

## 2019-12-15 MED ORDER — LIDOCAINE 2% (20 MG/ML) 5 ML SYRINGE
INTRAMUSCULAR | Status: DC | PRN
  Administered 2019-12-15: 60 mg via INTRAVENOUS

## 2019-12-15 MED ORDER — ORAL CARE MOUTH RINSE: 15.0000 mL | Freq: Once | OROMUCOSAL | Status: AC

## 2019-12-15 MED ORDER — SODIUM CHLORIDE 0.9 % IR SOLN
Status: DC | PRN
  Administered 2019-12-15: 1000 mL

## 2019-12-15 MED ORDER — BUPIVACAINE HCL (PF) 0.25 % IJ SOLN
INTRAMUSCULAR | Status: AC
  Filled 2019-12-15: qty 30

## 2019-12-15 MED ORDER — ACETAMINOPHEN 500 MG PO TABS
ORAL_TABLET | ORAL | Status: AC
  Administered 2019-12-15: 1000 mg via ORAL
  Filled 2019-12-15: qty 1

## 2019-12-15 MED ORDER — FENTANYL CITRATE (PF) 250 MCG/5ML IJ SOLN
INTRAMUSCULAR | Status: AC
  Filled 2019-12-15: qty 5

## 2019-12-15 MED ORDER — CIPROFLOXACIN IN D5W 400 MG/200ML IV SOLN
INTRAVENOUS | Status: AC
  Filled 2019-12-15: qty 200

## 2019-12-15 MED ORDER — PHENYLEPHRINE HCL-NACL 10-0.9 MG/250ML-% IV SOLN
INTRAVENOUS | Status: DC | PRN
  Administered 2019-12-15: 30 ug/min via INTRAVENOUS

## 2019-12-15 MED ORDER — ACETAMINOPHEN 325 MG PO TABS
650.0000 mg | ORAL_TABLET | Freq: Four times a day (QID) | ORAL | Status: DC
  Administered 2019-12-15 – 2019-12-17 (×9): 650 mg via ORAL
  Filled 2019-12-15 (×9): qty 2

## 2019-12-15 MED ORDER — ONDANSETRON HCL 4 MG/2ML IJ SOLN: 4.0000 mg | Freq: Once | INTRAMUSCULAR | Status: DC | PRN

## 2019-12-15 SURGICAL SUPPLY — 49 items
ADH SKN CLS APL DERMABOND .7 (GAUZE/BANDAGES/DRESSINGS) ×1
APL PRP STRL LF DISP 70% ISPRP (MISCELLANEOUS) ×1
APPLIER CLIP ROT 10 11.4 M/L (STAPLE) ×3
APR CLP MED LRG 11.4X10 (STAPLE) ×1
BAG SPEC RTRVL 10 TROC 200 (ENDOMECHANICALS) ×1
BLADE CLIPPER SURG (BLADE) IMPLANT
CANISTER SUCT 3000ML PPV (MISCELLANEOUS) ×3 IMPLANT
CATH REDDICK CHOLANGI 4FR 50CM (CATHETERS) ×2 IMPLANT
CHLORAPREP W/TINT 26 (MISCELLANEOUS) ×3 IMPLANT
CLIP APPLIE ROT 10 11.4 M/L (STAPLE) ×1 IMPLANT
CLIP VESOLOCK XL 6/CT (CLIP) IMPLANT
COVER MAYO STAND STRL (DRAPES) ×1 IMPLANT
COVER SURGICAL LIGHT HANDLE (MISCELLANEOUS) ×3 IMPLANT
COVER WAND RF STERILE (DRAPES) ×3 IMPLANT
DERMABOND ADVANCED (GAUZE/BANDAGES/DRESSINGS) ×2
DERMABOND ADVANCED .7 DNX12 (GAUZE/BANDAGES/DRESSINGS) ×1 IMPLANT
DRAPE C-ARM 42X120 X-RAY (DRAPES) ×1 IMPLANT
DRAPE WARM FLUID 44X44 (DRAPES) ×3 IMPLANT
ELECT REM PT RETURN 9FT ADLT (ELECTROSURGICAL) ×3
ELECTRODE REM PT RTRN 9FT ADLT (ELECTROSURGICAL) ×1 IMPLANT
FILTER SMOKE EVAC LAPAROSHD (FILTER) IMPLANT
GLOVE INDICATOR 6.5 STRL GRN (GLOVE) ×3 IMPLANT
GOWN STRL REUS W/ TWL LRG LVL3 (GOWN DISPOSABLE) ×2 IMPLANT
GOWN STRL REUS W/TWL 2XL LVL3 (GOWN DISPOSABLE) ×3 IMPLANT
GOWN STRL REUS W/TWL LRG LVL3 (GOWN DISPOSABLE) ×6
IV CATH 14GX2 1/4 (CATHETERS) ×2 IMPLANT
KIT BASIN OR (CUSTOM PROCEDURE TRAY) ×3 IMPLANT
KIT TURNOVER KIT B (KITS) ×3 IMPLANT
L-HOOK LAP DISP 36CM (ELECTROSURGICAL) ×3
LHOOK LAP DISP 36CM (ELECTROSURGICAL) ×1 IMPLANT
NS IRRIG 1000ML POUR BTL (IV SOLUTION) ×3 IMPLANT
PAD ARMBOARD 7.5X6 YLW CONV (MISCELLANEOUS) ×3 IMPLANT
PENCIL BUTTON HOLSTER BLD 10FT (ELECTRODE) ×3 IMPLANT
POUCH RETRIEVAL ECOSAC 10 (ENDOMECHANICALS) ×1 IMPLANT
POUCH RETRIEVAL ECOSAC 10MM (ENDOMECHANICALS) ×3
SCISSORS LAP 5X35 DISP (ENDOMECHANICALS) ×3 IMPLANT
SET IRRIG TUBING LAPAROSCOPIC (IRRIGATION / IRRIGATOR) ×3 IMPLANT
SET TUBE SMOKE EVAC HIGH FLOW (TUBING) ×3 IMPLANT
SLEEVE ENDOPATH XCEL 5M (ENDOMECHANICALS) ×3 IMPLANT
SPECIMEN JAR SMALL (MISCELLANEOUS) ×3 IMPLANT
SUT MNCRL AB 4-0 PS2 18 (SUTURE) ×3 IMPLANT
SUT VICRYL 0 UR6 27IN ABS (SUTURE) ×2 IMPLANT
TOWEL GREEN STERILE (TOWEL DISPOSABLE) ×3 IMPLANT
TOWEL GREEN STERILE FF (TOWEL DISPOSABLE) ×3 IMPLANT
TRAY LAPAROSCOPIC MC (CUSTOM PROCEDURE TRAY) ×3 IMPLANT
TROCAR XCEL BLUNT TIP 100MML (ENDOMECHANICALS) ×3 IMPLANT
TROCAR XCEL NON-BLD 11X100MML (ENDOMECHANICALS) ×3 IMPLANT
TROCAR XCEL NON-BLD 5MMX100MML (ENDOMECHANICALS) ×3 IMPLANT
WATER STERILE IRR 1000ML POUR (IV SOLUTION) ×3 IMPLANT

## 2019-12-15 NOTE — Op Note (Signed)
Laparoscopic Cholecystectomy  Indications: This patient presents with chronic calculous cholecystitis and history of choledocholithiasis.  She is s/p ERCP and sphincterotomy.    Pre-operative Diagnosis: chronic calculous cholecystitis  Post-operative Diagnosis: Same  Surgeon: Stark Klein   Assistants: Eliot Ford, MD, PGY5  Anesthesia: General endotracheal anesthesia and local  ASA Class: 3  Procedure Details  The patient was seen again in the Holding Room. The risks, benefits, complications, treatment options, and expected outcomes were discussed with the patient. The possibilities of  bleeding, recurrent infection, damage to nearby structures, the need for additional procedures, failure to diagnose a condition, the possible need to convert to an open procedure, and creating a complication requiring transfusion or operation were discussed with the patient. The likelihood of improving the patient's symptoms with return to their baseline status is good.    The patient and/or family concurred with the proposed plan, giving informed consent. The site of surgery properly noted. The patient was taken to Operating Room, and the procedure verified as Laparoscopic Cholecystectomy with possible Intraoperative Cholangiogram. A Time Out was held and the above information confirmed.  Prior to the induction of general anesthesia, antibiotic prophylaxis was administered. General endotracheal anesthesia was then administered and tolerated well. After the induction, the abdomen was prepped with Chloraprep and draped in the sterile fashion. The patient was positioned in the supine position.  Local anesthetic agent was injected into the skin near the umbilicus and a vertical incision was made with a #15 blade. I dissected down to the abdominal fascia with blunt dissection.  The fascia was incised vertically and we entered the peritoneal cavity bluntly.  A pursestring suture of 0-Vicryl was placed around  the fascial opening.  The Hasson cannula was inserted and secured with the stay suture.  Pneumoperitoneum was then created with CO2 and tolerated well without any adverse changes in the patient's vital signs. An 11-mm port was placed in the subxiphoid position.  Two 5-mm ports were placed in the right upper quadrant. All skin incisions were infiltrated with a local anesthetic agent before making the incision and placing the trocars.   We positioned the patient in reverse Trendelenburg, tilted slightly to the patient's left.  Omentum was stuck to the gallbladder.  This was taken down with cautery and blunt dissection.  The gallbladder was identified, the fundus grasped and retracted cephalad. Adhesions were lysed bluntly and with the electrocautery where indicated, taking care not to injure any adjacent organs or viscus. The infundibulum was grasped and retracted laterally, exposing the peritoneum overlying the triangle of Calot. This was then divided and exposed in a blunt fashion. The cystic artery and node of Calot was overlying the duct.  This was dissected free, doubly clipped on the patient side, and divided.  The gallbladder infundibulum was then dissected out more with blunt dissection and cautery until a critical view was identified.  The cystic duct was then ligated with clips and divided. Three clips were placed on the proximal cystic duct.  The gallbladder was dissected from the liver bed in retrograde fashion with the electrocautery. The gallbladder was removed and placed in an Ecosac.  The gallbladder and bag were then removed through the umbilical port site.  The liver bed was irrigated and inspected. Hemostasis was achieved with the electrocautery. Copious irrigation was utilized and was repeatedly aspirated until clear.    We again inspected the right upper quadrant for hemostasis.  Pneumoperitoneum was released as we removed the trocars.   The pursestring suture was  used to close the umbilical  fascia.  Additional interrupted 0-0 vicryl was placed to secure the umbilical fascia.  No residual palpable fascial defect was appreciated.  4-0 Monocryl was used to close the skin.   The skin was cleaned and dry, and Dermabond was applied. The patient was then extubated and brought to the recovery room in stable condition. Instrument, sponge, and needle counts were correct at closure and at the conclusion of the case.   Findings: Chronic inflammation.    Estimated Blood Loss: min         Drains: none          Specimens: Gallbladder to pathology       Complications: None; patient tolerated the procedure well.         Disposition: PACU - hemodynamically stable.         Condition: stable

## 2019-12-15 NOTE — Anesthesia Postprocedure Evaluation (Signed)
Anesthesia Post Note  Patient: Beverly Kim  Procedure(s) Performed: LAPAROSCOPIC CHOLECYSTECTOMY (N/A Abdomen)     Patient location during evaluation: PACU Anesthesia Type: General Level of consciousness: awake and alert Pain management: pain level controlled Vital Signs Assessment: post-procedure vital signs reviewed and stable Respiratory status: spontaneous breathing, nonlabored ventilation and respiratory function stable Cardiovascular status: blood pressure returned to baseline and stable Postop Assessment: no apparent nausea or vomiting Anesthetic complications: no   No complications documented.  Last Vitals:  Vitals:   12/15/19 1003 12/15/19 1023  BP: (!) 147/71 (!) 147/72  Pulse: 73   Resp: 20   Temp:    SpO2: 96%     Last Pain:  Vitals:   12/15/19 0709  TempSrc:   PainSc: 0-No pain                 Audry Pili

## 2019-12-15 NOTE — Anesthesia Procedure Notes (Signed)
Procedure Name: Intubation Date/Time: 12/15/2019 7:59 AM Performed by: Griffin Dakin, CRNA Pre-anesthesia Checklist: Patient identified, Emergency Drugs available, Suction available and Patient being monitored Patient Re-evaluated:Patient Re-evaluated prior to induction Oxygen Delivery Method: Circle system utilized Preoxygenation: Pre-oxygenation with 100% oxygen Induction Type: IV induction Ventilation: Mask ventilation without difficulty Laryngoscope Size: Mac and 3 Grade View: Grade I Tube type: Oral Tube size: 7.0 mm Number of attempts: 1 Airway Equipment and Method: Stylet and Oral airway Placement Confirmation: ETT inserted through vocal cords under direct vision,  positive ETCO2 and breath sounds checked- equal and bilateral Secured at: 22 cm Tube secured with: Tape Dental Injury: Teeth and Oropharynx as per pre-operative assessment

## 2019-12-15 NOTE — Interval H&P Note (Signed)
History and Physical Interval Note:  12/15/2019 7:34 AM  Beverly Kim  has presented today for surgery, with the diagnosis of [place].  The various methods of treatment have been discussed with the patient and family. After consideration of risks, benefits and other options for treatment, the patient has consented to  Procedure(s): LAPAROSCOPIC CHOLECYSTECTOMY WITH INTRAOPERATIVE CHOLANGIOGRAM (N/A) as a surgical intervention.  The patient's history has been reviewed, patient examined, no change in status, stable for surgery.  I have reviewed the patient's chart and labs.  Questions were answered to the patient's satisfaction.     Stark Klein

## 2019-12-15 NOTE — Progress Notes (Signed)
[employer] rep paged.

## 2019-12-15 NOTE — Transfer of Care (Signed)
Immediate Anesthesia Transfer of Care Note  Patient: Beverly Kim  Procedure(s) Performed: LAPAROSCOPIC CHOLECYSTECTOMY (N/A Abdomen)  Patient Location: PACU  Anesthesia Type:General  Level of Consciousness: drowsy  Airway & Oxygen Therapy: Patient Spontanous Breathing and Patient connected to face mask oxygen  Post-op Assessment: Report given to RN and Post -op Vital signs reviewed and stable  Post vital signs: Reviewed and stable  Last Vitals:  Vitals Value Taken Time  BP 138/64 12/15/19 0932  Temp    Pulse 70 12/15/19 0936  Resp 25 12/15/19 0936  SpO2 96 % 12/15/19 0936  Vitals shown include unvalidated device data.  Last Pain:  Vitals:   12/15/19 0709  TempSrc:   PainSc: 0-No pain      Patients Stated Pain Goal: 4 (75/05/10 7125)  Complications: No complications documented.

## 2019-12-15 NOTE — Progress Notes (Signed)
CHANIQUE DUCA is a 86 y.o. female patient admitted. Awake, alert - oriented  X 4 - no acute distress noted.  VSS - Blood pressure 123/70, pulse 70, temperature (Abnormal) 97.3 F (36.3 C), temperature source Oral, resp. rate 18, height 5\' 2"  (1.575 m), weight 69.1 kg, SpO2 100 %.    IV in place, occlusive dsg intact without redness.  Orientation to room, and floor completed.  Admission INP armband ID verified with patient/family, and in place.   SR up x 2, fall assessment complete, with patient and family able to verbalize understanding of risk associated with falls, and verbalized understanding to call nsg before up out of bed.  Call light within reach, patient able to voice, and demonstrate understanding.      Will cont to eval and treat per MD orders.  Cyndi Bender, RN 12/15/2019 6:12 PM

## 2019-12-16 ENCOUNTER — Encounter (HOSPITAL_COMMUNITY): Payer: Self-pay | Admitting: General Surgery

## 2019-12-16 LAB — CBC
HCT: 36.5 % (ref 36.0–46.0)
Hemoglobin: 11.5 g/dL — ABNORMAL LOW (ref 12.0–15.0)
MCH: 30 pg (ref 26.0–34.0)
MCHC: 31.5 g/dL (ref 30.0–36.0)
MCV: 95.3 fL (ref 80.0–100.0)
Platelets: 154 10*3/uL (ref 150–400)
RBC: 3.83 MIL/uL — ABNORMAL LOW (ref 3.87–5.11)
RDW: 15.8 % — ABNORMAL HIGH (ref 11.5–15.5)
WBC: 12.2 10*3/uL — ABNORMAL HIGH (ref 4.0–10.5)
nRBC: 0 % (ref 0.0–0.2)

## 2019-12-16 LAB — COMPREHENSIVE METABOLIC PANEL
ALT: 31 U/L (ref 0–44)
AST: 54 U/L — ABNORMAL HIGH (ref 15–41)
Albumin: 2.9 g/dL — ABNORMAL LOW (ref 3.5–5.0)
Alkaline Phosphatase: 55 U/L (ref 38–126)
Anion gap: 9 (ref 5–15)
BUN: 26 mg/dL — ABNORMAL HIGH (ref 8–23)
CO2: 26 mmol/L (ref 22–32)
Calcium: 9.1 mg/dL (ref 8.9–10.3)
Chloride: 100 mmol/L (ref 98–111)
Creatinine, Ser: 1.29 mg/dL — ABNORMAL HIGH (ref 0.44–1.00)
GFR, Estimated: 36 mL/min — ABNORMAL LOW (ref >60)
Glucose, Bld: 129 mg/dL — ABNORMAL HIGH (ref 70–99)
Potassium: 4.6 mmol/L (ref 3.5–5.1)
Sodium: 135 mmol/L (ref 135–145)
Total Bilirubin: 1.5 mg/dL — ABNORMAL HIGH (ref 0.3–1.2)
Total Protein: 5.9 g/dL — ABNORMAL LOW (ref 6.5–8.1)

## 2019-12-16 LAB — SURGICAL PATHOLOGY

## 2019-12-16 MED ORDER — TRAMADOL HCL 50 MG PO TABS
50.0000 mg | ORAL_TABLET | Freq: Four times a day (QID) | ORAL | Status: DC | PRN
  Administered 2019-12-16: 50 mg via ORAL
  Filled 2019-12-16: qty 1

## 2019-12-16 NOTE — Progress Notes (Signed)
1 Day Post-Op   Subjective/Chief Complaint: Hasn't been out of bed yet.  Having a headache this AM.     Objective: Vital signs in last 24 hours: Temp:  [97.1 F (36.2 C)-98.8 F (37.1 C)] 97.4 F (36.3 C) (10/13 0537) Pulse Rate:  [69-93] 72 (10/13 0537) Resp:  [17-18] 17 (10/13 0537) BP: (104-153)/(45-76) 111/64 (10/13 0537) SpO2:  [93 %-100 %] 95 % (10/13 0537)    Intake/Output from previous day: 10/12 0701 - 10/13 0700 In: 1681.6 [P.O.:360; I.V.:1321.6] Out: 250 [Urine:200; Blood:50] Intake/Output this shift: No intake/output data recorded.  General appearance: alert, cooperative and mild distress Eyes: PERRL Resp: breathing comfortably GI: soft, sl distended, approp tender.  Lab Results:  Recent Labs    12/16/19 0317  WBC 12.2*  HGB 11.5*  HCT 36.5  PLT 154   BMET Recent Labs    12/16/19 0317  NA 135  K 4.6  CL 100  CO2 26  GLUCOSE 129*  BUN 26*  CREATININE 1.29*  CALCIUM 9.1   PT/INR No results for input(s): LABPROT, INR in the last 72 hours. ABG No results for input(s): PHART, HCO3 in the last 72 hours.  Invalid input(s): PCO2, PO2  Studies/Results: No results found.  Anti-infectives: Anti-infectives (From admission, onward)   Start     Dose/Rate Route Frequency Ordered Stop   12/15/19 0700  ciprofloxacin (CIPRO) IVPB 400 mg        400 mg 200 mL/hr over 60 Minutes Intravenous On call to O.R. 12/15/19 0651 12/15/19 0800   12/15/19 0656  ciprofloxacin (CIPRO) 400 MG/200ML IVPB       Note to Pharmacy: Jasmine Pang   : cabinet override      12/15/19 0656 12/15/19 0835      Assessment/Plan: s/p Procedure(s): LAPAROSCOPIC CHOLECYSTECTOMY (N/A) Plan for discharge tomorrow looks a bit frail, which is unsurprising given age.    PT consult Mild AKI- leave IV fluids on, recheck in AM.  Will need another day.     LOS: 1 day    Stark Klein 12/16/2019

## 2019-12-16 NOTE — Evaluation (Signed)
Physical Therapy Evaluation Patient Details Name: Beverly Kim MRN: [id number] DOB: [birth year] Today's Date: 12/16/2019   History of Present Illness  86 year old female who presents for a follow-up for Gall stones. The patient is a 86 year old female that saw Dr. Excell Seltzer 2 years ago after several attacks of symptomatic cholelithiasis.  He felt that she also had chronic cholecystitis as well.  She was reluctant to have her gallbladder removed at that point due to her age and number problems.  However earlier this summer she required admission to the hospital and was found to have obstructing gallstones.  She required ERCP with sphincterotomy.  Since being discharged just around a month ago, she has not felt back to normal.  She still remains extremely tired. Pt underwent LAPAROSCOPIC CHOLECYSTECTOMY on 12/15/2019.  Clinical Impression  Pt presents to PT with deficits in functional mobility, gait, balance, power, strength, endurance, and with pain. Pt reports pain in abdomen and upper body which has been limiting her activity tolerance. Pt requires some physical assistance to mobilize to the edge of bed but reports she will be sleeping in a recliner temporarily at the time of discharge. Pt needs assistance for safety and fatigues quickly with OOB mobility. Pt will benefit from attempting gait training with a Rollator at next session to see if this device will provide sufficient support at the time of discharge, otherwise the pt will benefit from receiving a RW. PT also recommends HHPT.    Follow Up Recommendations Home health PT;Supervision/Assistance - 24 hour    Equipment Recommendations   (TBD, trial rollator next session)    Recommendations for Other Services       Precautions / Restrictions Precautions Precautions: Fall Restrictions Weight Bearing Restrictions: No      Mobility  Bed Mobility Overal bed mobility: Needs Assistance Bed Mobility: Rolling;Sidelying to Sit Rolling:  Min assist Sidelying to sit: Min assist          Transfers Overall transfer level: Needs assistance Equipment used: Rolling walker (2 wheeled) Transfers: Sit to/from Stand Sit to Stand: Min guard            Ambulation/Gait Ambulation/Gait assistance: Min guard Gait Distance (Feet): 20 Feet Assistive device: Rolling walker (2 wheeled) Gait Pattern/deviations: Step-to pattern Gait velocity: reduced Gait velocity interpretation: <1.8 ft/sec, indicate of risk for recurrent falls General Gait Details: pt with short step to gait, reduced gait speed and step length  Stairs            Wheelchair Mobility    Modified Rankin (Stroke Patients Only)       Balance Overall balance assessment: Needs assistance Sitting-balance support: No upper extremity supported;Feet supported Sitting balance-Leahy Scale: Good     Standing balance support: Bilateral upper extremity supported Standing balance-Leahy Scale: Poor Standing balance comment: reliant on BUE support of RW                             Pertinent Vitals/Pain Pain Assessment: Faces Faces Pain Scale: Hurts even more Pain Location: abdomen and shoulders Pain Descriptors / Indicators: Aching Pain Intervention(s): Monitored during session    Home Living Family/patient expects to be discharged to:: Private residence Living Arrangements: Children Available Help at Discharge: Family;Available 24 hours/day Type of Home: House Home Access: Ramped entrance     Home Layout: One level Home Equipment: Walker - 4 wheels;Cane - single point;Shower seat Additional Comments: pt going to stay at son's house  Prior Function Level of Independence: Independent;Independent with assistive device(s)         Comments: pt reports very little use of Rollator, only when she needs to transport items with her. Pt does report holding onto walls intermittently when ambulating     Hand Dominance   Dominant Hand:  Right    Extremity/Trunk Assessment   Upper Extremity Assessment Upper Extremity Assessment: LUE deficits/detail LUE Deficits / Details: chronic weakness and ROM deficits from prior humerus fx    Lower Extremity Assessment Lower Extremity Assessment: Generalized weakness    Cervical / Trunk Assessment Cervical / Trunk Assessment: Normal  Communication   Communication: HOH  Cognition Arousal/Alertness: Awake/alert Behavior During Therapy: WFL for tasks assessed/performed Overall Cognitive Status: Impaired/Different from baseline Area of Impairment: Memory                     Memory: Decreased short-term memory         General Comments: pt appears to have short term memory deficits, repeating that she previously broke her arm in 4 places multiple times during session      General Comments General comments (skin integrity, edema, etc.): VSS on RA    Exercises     Assessment/Plan    PT Assessment Patient needs continued PT services  PT Problem List Decreased strength;Decreased activity tolerance;Decreased mobility;Decreased balance;Decreased knowledge of use of DME;Pain       PT Treatment Interventions DME instruction;Gait training;Functional mobility training;Therapeutic activities;Therapeutic exercise;Balance training;Neuromuscular re-education;Patient/family education    PT Goals (Current goals can be found in the Care Plan section)  Acute Rehab PT Goals Patient Stated Goal: To get stronger and go home PT Goal Formulation: With patient/family Time For Goal Achievement: 12/30/19 Potential to Achieve Goals: Good    Frequency Min 3X/week   Barriers to discharge        Co-evaluation               AM-PAC PT "6 Clicks" Mobility  Outcome Measure Help needed turning from your back to your side while in a flat bed without using bedrails?: A Little Help needed moving from lying on your back to sitting on the side of a flat bed without using  bedrails?: A Little Help needed moving to and from a bed to a chair (including a wheelchair)?: A Little Help needed standing up from a chair using your arms (e.g., wheelchair or bedside chair)?: A Little Help needed to walk in hospital room?: A Little Help needed climbing 3-5 steps with a railing? : A Lot 6 Click Score: 17    End of Session   Activity Tolerance: Patient tolerated treatment well Patient left: in chair;with call bell/phone within reach;with chair alarm set;with family/visitor present Nurse Communication: Mobility status PT Visit Diagnosis: Other abnormalities of gait and mobility (R26.89);Muscle weakness (generalized) (M62.81)    Time: 8119-1478 PT Time Calculation (min) (ACUTE ONLY): 40 min   Charges:   PT Evaluation $PT Eval Low Complexity: 1 Low PT Treatments $Therapeutic Activity: 8-22 mins        Zenaida Niece, PT, DPT Acute Rehabilitation Pager: [phone]   Zenaida Niece 12/16/2019, 5:33 PM

## 2019-12-17 LAB — COMPREHENSIVE METABOLIC PANEL
ALT: 27 U/L (ref 0–44)
AST: 37 U/L (ref 15–41)
Albumin: 2.5 g/dL — ABNORMAL LOW (ref 3.5–5.0)
Alkaline Phosphatase: 58 U/L (ref 38–126)
Anion gap: 8 (ref 5–15)
BUN: 21 mg/dL (ref 8–23)
CO2: 23 mmol/L (ref 22–32)
Calcium: 8.7 mg/dL — ABNORMAL LOW (ref 8.9–10.3)
Chloride: 99 mmol/L (ref 98–111)
Creatinine, Ser: 1.09 mg/dL — ABNORMAL HIGH (ref 0.44–1.00)
GFR, Estimated: 44 mL/min — ABNORMAL LOW (ref >60)
Glucose, Bld: 119 mg/dL — ABNORMAL HIGH (ref 70–99)
Potassium: 4.7 mmol/L (ref 3.5–5.1)
Sodium: 130 mmol/L — ABNORMAL LOW (ref 135–145)
Total Bilirubin: 1.8 mg/dL — ABNORMAL HIGH (ref 0.3–1.2)
Total Protein: 5.2 g/dL — ABNORMAL LOW (ref 6.5–8.1)

## 2019-12-17 LAB — CBC WITH DIFFERENTIAL/PLATELET
Abs Immature Granulocytes: 0.1 10*3/uL — ABNORMAL HIGH (ref 0.00–0.07)
Basophils Absolute: 0 10*3/uL (ref 0.0–0.1)
Basophils Relative: 0 %
Eosinophils Absolute: 0.2 10*3/uL (ref 0.0–0.5)
Eosinophils Relative: 2 %
HCT: 32.7 % — ABNORMAL LOW (ref 36.0–46.0)
Hemoglobin: 10.5 g/dL — ABNORMAL LOW (ref 12.0–15.0)
Immature Granulocytes: 1 %
Lymphocytes Relative: 9 %
Lymphs Abs: 1 10*3/uL (ref 0.7–4.0)
MCH: 30.3 pg (ref 26.0–34.0)
MCHC: 32.1 g/dL (ref 30.0–36.0)
MCV: 94.5 fL (ref 80.0–100.0)
Monocytes Absolute: 0.6 10*3/uL (ref 0.1–1.0)
Monocytes Relative: 5 %
Neutro Abs: 9 10*3/uL — ABNORMAL HIGH (ref 1.7–7.7)
Neutrophils Relative %: 83 %
Platelets: 134 10*3/uL — ABNORMAL LOW (ref 150–400)
RBC: 3.46 MIL/uL — ABNORMAL LOW (ref 3.87–5.11)
RDW: 15.7 % — ABNORMAL HIGH (ref 11.5–15.5)
WBC: 10.9 10*3/uL — ABNORMAL HIGH (ref 4.0–10.5)
nRBC: 0 % (ref 0.0–0.2)

## 2019-12-17 MED ORDER — TRAMADOL HCL 50 MG PO TABS
50.0000 mg | ORAL_TABLET | Freq: Four times a day (QID) | ORAL | 1 refills | Status: DC | PRN
Start: 2019-12-17 — End: 2020-05-31

## 2019-12-17 NOTE — Progress Notes (Signed)
Physical Therapy Treatment Patient Details Name: Beverly Kim MRN: [id number] DOB: [birth year] Today's Date: 12/17/2019    History of Present Illness 86 year old female who presents for a follow-up for Gall stones. The patient is a 86 year old female that saw Dr. Excell Seltzer 2 years ago after several attacks of symptomatic cholelithiasis.  He felt that she also had chronic cholecystitis as well.  She was reluctant to have her gallbladder removed at that point due to her age and number problems.  However earlier this summer she required admission to the hospital and was found to have obstructing gallstones.  She required ERCP with sphincterotomy.  Since being discharged just around a month ago, she has not felt back to normal.  She still remains extremely tired. Pt underwent LAPAROSCOPIC CHOLECYSTECTOMY on 12/15/2019.    PT Comments    Pt progressing towards her physical therapy goals. Requiring min assist for bed mobility/transfers, ambulating 40 feet with a Rollator at a min assist level. SpO2 96% on RA. Pt remains limited by abdominal pain, decreased endurance and weakness. Recommended use of rolling walker versus Rollator initially for pain control (pt son reports they have both options at home). D/c plan remains appropriate.    Follow Up Recommendations  Home health PT;Supervision/Assistance - 24 hour     Equipment Recommendations  None recommended by PT (has needed DME)   Recommendations for Other Services       Precautions / Restrictions Precautions Precautions: Fall Restrictions Weight Bearing Restrictions: No    Mobility  Bed Mobility Overal bed mobility: Needs Assistance Bed Mobility: Supine to Sit     Supine to sit: Min assist     General bed mobility comments: MinA to pull trunk up to sitting position  Transfers Overall transfer level: Needs assistance Equipment used: 4-wheeled walker Transfers: Sit to/from Stand Sit to Stand: Min assist         General  transfer comment: MinA to rise to stand  Ambulation/Gait Ambulation/Gait assistance: Min guard Gait Distance (Feet): 40 Feet Assistive device: 4-wheeled walker Gait Pattern/deviations: Step-through pattern;Decreased stride length;Trunk flexed Gait velocity: reduced Gait velocity interpretation: <1.8 ft/sec, indicate of risk for recurrent falls General Gait Details: Cues for walker proximity, activity pacing, min guard for safety.   Stairs             Wheelchair Mobility    Modified Rankin (Stroke Patients Only)       Balance Overall balance assessment: Needs assistance Sitting-balance support: No upper extremity supported;Feet supported Sitting balance-Leahy Scale: Good     Standing balance support: Bilateral upper extremity supported Standing balance-Leahy Scale: Poor Standing balance comment: reliant on BUE support of RW                            Cognition Arousal/Alertness: Awake/alert Behavior During Therapy: WFL for tasks assessed/performed Overall Cognitive Status: Impaired/Different from baseline Area of Impairment: Memory                     Memory: Decreased short-term memory                Exercises      General Comments        Pertinent Vitals/Pain Pain Assessment: Faces Faces Pain Scale: Hurts even more Pain Location: abdomen and shoulders Pain Descriptors / Indicators: Aching Pain Intervention(s): Limited activity within patient's tolerance;Monitored during session    Home Living  Prior Function            PT Goals (current goals can now be found in the care plan section) Acute Rehab PT Goals Patient Stated Goal: To get stronger and go home PT Goal Formulation: With patient/family Time For Goal Achievement: 12/30/19 Potential to Achieve Goals: Good Progress towards PT goals: Progressing toward goals    Frequency    Min 3X/week      PT Plan Current plan remains  appropriate    Co-evaluation              AM-PAC PT "6 Clicks" Mobility   Outcome Measure  Help needed turning from your back to your side while in a flat bed without using bedrails?: A Little Help needed moving from lying on your back to sitting on the side of a flat bed without using bedrails?: A Little Help needed moving to and from a bed to a chair (including a wheelchair)?: A Little Help needed standing up from a chair using your arms (e.g., wheelchair or bedside chair)?: A Little Help needed to walk in hospital room?: A Little Help needed climbing 3-5 steps with a railing? : A Lot 6 Click Score: 17    End of Session Equipment Utilized During Treatment: Gait belt Activity Tolerance: Patient tolerated treatment well Patient left: in chair;with call bell/phone within reach;with family/visitor present Nurse Communication: Mobility status PT Visit Diagnosis: Other abnormalities of gait and mobility (R26.89);Muscle weakness (generalized) (M62.81)     Time: 0539-7673 PT Time Calculation (min) (ACUTE ONLY): 32 min  Charges:  $Gait Training: 8-22 mins $Therapeutic Activity: 8-22 mins                     Wyona Almas, PT, DPT Acute Rehabilitation Services Pager [phone] Office [phone]    Deno Etienne 12/17/2019, 3:23 PM

## 2019-12-17 NOTE — Discharge Summary (Signed)
Physician Discharge Summary  Patient ID: Beverly Kim MRN: [id number] DOB/AGE: [birth year] 86 y.o.  Admit date: 12/15/2019 Discharge date: 12/17/2019  Admission Diagnoses: Chronic calculous cholecystitis H/o choledocholithiasis HTN CAD Chronic diastolic heart failure A fib Hypothyroidism Hyperlipidemia   Discharge Diagnoses:  Active Problems:   Biliary colic   Chronic calculous cholecystitis Mild acute kidney injury  Discharged Condition: stable  Hospital Course:  Pt was admitted to the floor following laparoscopic cholecystectomy.  She had more pain than anticipated for her and needed tramadol in addition to tylenol.  Given her age of 86, she needed PT to help her get up and around.  She had mild AKI on POD 1 with a Cr of 1.29.  She was left on IV fluids and this corrected down to 1.09 which was lower than pre op of 1.17.  She did well with PT, but HH PT was recommended.  Her diet was advanced and she had no n/v.  She also woke up much more and was able to tolerate tramadol for pain control.  She is discharged to home with her son/daughter in law with [facility] PT and [facility] RN.  Her T bili was sl elevated, but she has already had ERCP with sphincterotomy and had very good definition of her structures.  Will plan to recheck labs on Monday of next week.    Consults: PT  Significant Diagnostic Studies: labs: T bili 1.8.  Cr 1.09.    Treatments: surgery: see above   Discharge Exam: Blood pressure (!) 119/51, pulse 70, temperature 98.1 F (36.7 C), temperature source Oral, resp. rate 16, height 5\' 2"  (1.575 m), weight 69.1 kg, SpO2 92 %. General appearance: alert, cooperative and no distress Resp: breathing comfortably GI: soft, non distended, approp tender.  incision c/d/i.   Extremities: extremities normal, atraumatic, no cyanosis or edema  Disposition: Discharge disposition: 01-Home or Self Care       Discharge Instructions    Call MD for:  difficulty breathing, headache or  visual disturbances   Complete by: As directed    Call MD for:  persistant nausea and vomiting   Complete by: As directed    Call MD for:  redness, tenderness, or signs of infection (pain, swelling, redness, odor or green/yellow discharge around incision site)   Complete by: As directed    Call MD for:  severe uncontrolled pain   Complete by: As directed    Call MD for:  temperature >100.4   Complete by: As directed    Diet - low sodium heart healthy   Complete by: As directed    Increase activity slowly   Complete by: As directed      Allergies as of 12/17/2019      Reactions   Adhesive [tape] Itching, Dermatitis, Rash, Other (See Comments)   Blisters and "skin bubbles"   Ace Inhibitors Cough   Atorvastatin Other (See Comments)   REACTION: Reaction not known   Clindamycin Other (See Comments)   Unknown   Codeine Other (See Comments)   hallucinations    Latex Other (See Comments)   blisters   Shellfish Allergy Other (See Comments)   "gallbladder attack"   Simvastatin Other (See Comments)   fatigue   Penicillins Rash   Has patient had a PCN reaction causing immediate rash, facial/tongue/throat swelling, SOB or lightheadedness with hypotension: Unknown Has patient had a PCN reaction causing severe rash involving mucus membranes or skin necrosis: Unknown Has patient had a PCN reaction that required hospitalization: Unknown  Has patient had a PCN reaction occurring within the last 10 years: No If all of the above answers are "NO", then may proceed with Cephalosporin use.      Medication List    STOP taking these medications   ondansetron 4 MG disintegrating tablet Commonly known as: Zofran ODT     TAKE these medications   acetaminophen 650 MG CR tablet Commonly known as: TYLENOL Take 650 mg by mouth every 8 (eight) hours as needed for pain.   albuterol 108 (90 Base) MCG/ACT inhaler Commonly known as: VENTOLIN HFA Inhale 1-2 puffs into the lungs every 6 (six) hours as  needed for wheezing or shortness of breath.   bisoprolol 5 MG tablet Commonly known as: ZEBETA Take 1 tablet (5 mg total) by mouth daily.   calcium carbonate 500 MG chewable tablet Commonly known as: TUMS - dosed in mg elemental calcium Chew 1 tablet by mouth daily as needed for indigestion or heartburn.   clotrimazole 1 % cream Commonly known as: Clotrimazole Anti-Fungal Apply 1 application topically 2 (two) times daily.   Fluocinolone Acetonide 0.01 % Oil Place 3-4 drops into both ears daily as needed for itching.   furosemide 40 MG tablet Commonly known as: LASIX Take 1 tablet by mouth once daily   ibuprofen 200 MG tablet Commonly known as: ADVIL Take 200 mg by mouth every 6 (six) hours as needed for moderate pain.   Melatonin 5 MG Caps Take 5 mg by mouth at bedtime as needed (sleep).   metolazone 2.5 MG tablet Commonly known as: ZAROXOLYN TAKE ONE TABLET BY MOUTH THREE TIMES A WEEK What changed:   how much to take  how to take this  when to take this  additional instructions   potassium chloride 10 MEQ tablet Commonly known as: KLOR-CON Take 1 tablet by mouth on days metolazone are taken What changed:   how much to take  how to take this  when to take this  reasons to take this  additional instructions   Rivaroxaban 15 MG Tabs tablet Commonly known as: Xarelto Take 1 tablet (15 mg total) by mouth daily. What changed: when to take this   Synthroid 88 MCG tablet Generic drug: levothyroxine TAKE 1 TABLET BY MOUTH ONCE DAILY BEFORE BREAKFAST   traMADol 50 MG tablet Commonly known as: ULTRAM Take 1 tablet (50 mg total) by mouth every 6 (six) hours as needed for moderate pain.   Trelegy Ellipta 100-62.5-25 MCG/INH Aepb Generic drug: Fluticasone-Umeclidin-Vilant Inhale 1 puff into the lungs daily.   triamcinolone cream 0.1 % Commonly known as: KENALOG Apply 1 application topically 2 (two) times daily as needed. What changed: reasons to take  this   vitamin C 500 MG tablet Commonly known as: ASCORBIC ACID Take 500 mg by mouth daily.       Follow-up Information    Stark Klein, MD Follow up in 2 week(s).   Specialty: General Surgery Contact information: [address] [place] [address] [place], [place] Follow up.   Specialty: Home Health Services Contact information: [address] [place] [address] [phone]               Signed: Stark Klein 12/17/2019, 12:55 PM

## 2019-12-17 NOTE — Discharge Instructions (Signed)
[place] Office Phone Number [phone]   POST OP INSTRUCTIONS  Always review your discharge instruction sheet given to you by the facility where your surgery was performed.  IF YOU HAVE DISABILITY OR FAMILY LEAVE FORMS, YOU MUST BRING THEM TO THE OFFICE FOR PROCESSING.  DO NOT GIVE THEM TO YOUR DOCTOR.  1. A prescription for pain medication may be given to you upon discharge.  Take your pain medication as prescribed, if needed.  If narcotic pain medicine is not needed, then you may take acetaminophen (Tylenol) or ibuprofen (Advil) as needed. 2. Take your usually prescribed medications unless otherwise directed 3. If you need a refill on your pain medication, please contact your pharmacy.  They will contact our office to request authorization.  Prescriptions will not be filled after 5pm or on week-ends. 4. You should eat very light the first 24 hours after surgery, such as soup, crackers, pudding, etc.  Resume your normal diet the day after surgery 5. It is common to experience some constipation if taking pain medication after surgery.  Increasing fluid intake and taking a stool softener will usually help or prevent this problem from occurring.  A mild laxative (Milk of Magnesia or Miralax) should be taken according to package directions if there are no bowel movements after 48 hours. 6. You may shower in 48 hours.  The surgical glue will flake off in 2-3 weeks.   7. ACTIVITIES:  No strenuous activity or heavy lifting for 2 weeks.   a. You may drive when you no longer are taking prescription pain medication, you can comfortably wear a seatbelt, and you can safely maneuver your car and apply brakes. b. RETURN TO WORK:  __________n/a_______________ Dennis Bast should see your doctor in the office for a follow-up appointment approximately three-four weeks after your surgery.    WHEN TO CALL YOUR DOCTOR: 1. Fever over 101.0 2. Nausea and/or vomiting. 3. Extreme swelling or  bruising. 4. Continued bleeding from incision. 5. Increased pain, redness, or drainage from the incision.  The clinic staff is available to answer your questions during regular business hours.  Please don't hesitate to call and ask to speak to one of the nurses for clinical concerns.  If you have a medical emergency, go to the nearest emergency room or call 911.  A surgeon from [facility] is always on call at the hospital.  For further questions, please visit [email or web address]

## 2019-12-17 NOTE — TOC Initial Note (Addendum)
Transition of Care [facility]) - Initial/Assessment Note    Patient Details  Name: Beverly Kim MRN: [id number] Date of Birth: April 03, 1924  [facility] CM/SW Contact:    Marilu Favre, RN Phone Number: 12/17/2019, 11:26 AM  Clinical Narrative:                 Spoke to patient at bedside. Patient from home alone. However, she plans to stay with her son and daughter in law Helene Kelp [phone]) at discharge.   Patient states she has a walker and Rollator already at home. She is unsure of Teresa's complete address. Also, she has had [facility] in the past and does  ot want [facility] again. She has had a home health agency that she likes and wants back but does not remember their name. Patient asked NCM to Call Helene Kelp for address and home health agency name. NCM called and left voicemail awaiting call back. Helene Kelp returned call address: [place] does not remember prior address, and has no preference in agency.  Helene Kelp with [facility] unable to accept referral  [place] with [facility] able to accept and provide [facility] on Monday December 21, 2019 to draw CMET and call results to [facility] office.  Expected Discharge Plan: [place] Barriers to Discharge: Continued Medical Work up   Patient Goals and CMS Choice Patient states their goals for this hospitalization and ongoing recovery are:: to return home CMS Medicare.gov Compare Post Acute Care list provided to:: Patient Choice offered to / list presented to : Patient  Expected Discharge Plan and Services Expected Discharge Plan: [place]   Discharge Planning Services: CM Consult Post Acute Care Choice: [place] arrangements for the past 2 months: Single Family Home                   DME Agency: NA       HH Arranged: PT          Prior Living Arrangements/Services Living arrangements for the past 2 months: Single Family  Home Lives with:: Self Patient language and need for interpreter reviewed:: Yes        Need for Family Participation in Patient Care: Yes (Comment) Care giver support system in place?: Yes (comment)   Criminal Activity/Legal Involvement Pertinent to Current Situation/Hospitalization: No - Comment as needed  Activities of Daily Living      Permission Sought/Granted   Permission granted to share information with : Yes, Verbal Permission Granted  Share Information with NAME: Oniyah Rohe [phone] daughter in law           Emotional Assessment Appearance:: Appears stated age Attitude/Demeanor/Rapport: Engaged Affect (typically observed): Accepting Orientation: : Oriented to Self, Oriented to Place, Oriented to  Time, Oriented to Situation Alcohol / Substance Use: Not Applicable Psych Involvement: No (comment)  Admission diagnosis:  Biliary colic [P29.51] Chronic calculous cholecystitis [K80.10] Patient Active Problem List   Diagnosis Date Noted  . Biliary colic 88/41/6606  . Chronic calculous cholecystitis 12/15/2019  . Cardiac pacemaker 09/17/2019  . Preop cardiovascular exam 09/17/2019  . Vertigo 09/12/2019  . Unsteady gait when walking 09/12/2019  . Dyshidrotic eczema 09/12/2019  . Nausea 08/05/2019  . Black stool 07/31/2019  . Fluid overload 07/31/2019  . Abdominal pain   . Acute cholecystitis 07/19/2019  . Persistent atrial fibrillation CHA2DS2-VASc score =5; Xarelto 15 mg 01/23/2019  . Secondary hypercoagulable  state ([place]) 01/23/2019  . Fatigue 11/25/2018  . Poor balance 10/22/2018  . Calculus of gallbladder without cholecystitis without obstruction 07/26/2017  . Pyelonephritis 07/25/2017  . Carotid disease, bilateral ([place]) 04/08/2017  . Leg pain 04/08/2017  . Non-rheumatic mitral regurgitation 07/12/2016  . Complete heart block ([place]) 05/14/2016  . Diabetes mellitus without complication ([place]) 87/19/5974  . Degenerative disc disease, cervical 08/23/2015   . Degenerative disc disease, lumbar 08/23/2015  . Left rotator cuff tear arthropathy 07/12/2015  . Left shoulder pain 04/06/2015  . Frozen shoulder 01/03/2015  . Cough variant asthma 12/08/2013  . Upper airway cough syndrome 10/27/2013  . Dyspnea 02/03/2013  . GERD 07/06/2009  . HIATAL HERNIA 07/06/2009  . DIVERTICULOSIS, COLON 07/06/2009  . HEPATIC CYST 07/06/2009  . COLONIC POLYPS, HX OF 07/06/2009  . MIXED HEARING LOSS BILATERAL 12/13/2008  . Essential hypertension 06/19/2008  . MITRAL VALVE PROLAPSE 06/19/2008  . LBBB (left bundle branch block) 06/19/2008  . Hyperlipidemia 08/12/2007  . Hypothyroidism 12/02/2006  . CAD (coronary artery disease) 12/02/2006  . Chronic diastolic heart failure ([place]) 12/02/2006   PCP:  [name], MD Pharmacy:   [facility], [address], [address] [place], [place] [address] Phone: [phone] Fax: [place] [address] [place]), [place] - [address] [place] ([place]) [place] [address] Phone: [phone] Fax: [phone]     Social Determinants of Health (SDOH) Interventions    Readmission Risk Interventions No flowsheet data found.

## 2019-12-23 ENCOUNTER — Other Ambulatory Visit (HOSPITAL_COMMUNITY): Payer: Self-pay | Admitting: Physician Assistant

## 2019-12-23 ENCOUNTER — Other Ambulatory Visit: Payer: Self-pay | Admitting: Internal Medicine

## 2019-12-23 NOTE — Telephone Encounter (Addendum)
Prescription refill request for Xarelto received.   Last office visit: Beverly Kim, 09/17/2019 Weight: 69.1 kg Age: 86 yo Scr: 1.09, 12/17/2019 CrCl: 35 ml/min     Prescription refill sent.

## 2020-01-08 DIAGNOSIS — K8010 Calculus of gallbladder with chronic cholecystitis without obstruction: Secondary | ICD-10-CM | POA: Diagnosis not present

## 2020-02-19 ENCOUNTER — Ambulatory Visit (INDEPENDENT_AMBULATORY_CARE_PROVIDER_SITE_OTHER): Payer: PPO

## 2020-02-19 DIAGNOSIS — I442 Atrioventricular block, complete: Secondary | ICD-10-CM | POA: Diagnosis not present

## 2020-02-19 LAB — CUP PACEART REMOTE DEVICE CHECK
Battery Remaining Longevity: 114 mo
Battery Remaining Percentage: 95.5 %
Battery Voltage: 2.98 V
Brady Statistic AP VP Percent: 55 %
Brady Statistic AP VS Percent: 1 %
Brady Statistic AS VP Percent: 42 %
Brady Statistic AS VS Percent: 1 %
Brady Statistic RA Percent Paced: 15 %
Brady Statistic RV Percent Paced: 96 %
Date Time Interrogation Session: 20211217040015
Implantable Lead Implant Date: 20180313
Implantable Lead Implant Date: 20180313
Implantable Lead Location: 753859
Implantable Lead Location: 753860
Implantable Pulse Generator Implant Date: 20180313
Lead Channel Impedance Value: 380 Ohm
Lead Channel Impedance Value: 430 Ohm
Lead Channel Pacing Threshold Amplitude: 0.875 V
Lead Channel Pacing Threshold Amplitude: 1.25 V
Lead Channel Pacing Threshold Pulse Width: 0.5 ms
Lead Channel Pacing Threshold Pulse Width: 0.5 ms
Lead Channel Sensing Intrinsic Amplitude: 0.6 mV
Lead Channel Sensing Intrinsic Amplitude: 12 mV
Lead Channel Setting Pacing Amplitude: 1.125
Lead Channel Setting Pacing Amplitude: 2.5 V
Lead Channel Setting Pacing Pulse Width: 0.5 ms
Lead Channel Setting Sensing Sensitivity: 4 mV
Pulse Gen Model: 2272
Pulse Gen Serial Number: 8005625

## 2020-03-03 NOTE — Progress Notes (Signed)
Remote pacemaker transmission.   

## 2020-04-08 ENCOUNTER — Other Ambulatory Visit: Payer: Self-pay

## 2020-04-08 ENCOUNTER — Encounter: Payer: Self-pay | Admitting: Cardiology

## 2020-04-08 ENCOUNTER — Ambulatory Visit: Payer: PPO | Admitting: Cardiology

## 2020-04-08 DIAGNOSIS — I5032 Chronic diastolic (congestive) heart failure: Secondary | ICD-10-CM | POA: Diagnosis not present

## 2020-04-08 DIAGNOSIS — I4819 Other persistent atrial fibrillation: Secondary | ICD-10-CM | POA: Diagnosis not present

## 2020-04-08 DIAGNOSIS — I2510 Atherosclerotic heart disease of native coronary artery without angina pectoris: Secondary | ICD-10-CM

## 2020-04-08 DIAGNOSIS — Z950 Presence of cardiac pacemaker: Secondary | ICD-10-CM

## 2020-04-08 DIAGNOSIS — I340 Nonrheumatic mitral (valve) insufficiency: Secondary | ICD-10-CM | POA: Diagnosis not present

## 2020-04-08 MED ORDER — BISOPROLOL FUMARATE 5 MG PO TABS: 2.5000 mg | ORAL_TABLET | Freq: Two times a day (BID) | ORAL | 3 refills | Status: DC

## 2020-04-08 NOTE — Patient Instructions (Signed)
Medication Instructions:    decrease Bisoprolol to 2.5 mg ( 1/2 tablet of 5 mg ) twice a day   *If you need a refill on your cardiac medications before your next appointment, please call your pharmacy*  Other Instructions Hydrate  Drink about 6 to 8 glasses of 8 oz of water a day    Lab Work:    Testing/Procedures: Not needed   Follow-Up: At [facility], you and your health needs are our priority.  As part of our continuing mission to provide you with exceptional heart care, we have created designated Provider Care Teams.  These Care Teams include your primary Cardiologist (physician) and Advanced Practice Providers (APPs -  Physician Assistants and Nurse Practitioners) who all work together to provide you with the care you need, when you need it.  We recommend signing up for the patient portal called "MyChart".  Sign up information is provided on this After Visit Summary.  MyChart is used to connect with patients for Virtual Visits (Telemedicine).  Patients are able to view lab/test results, encounter notes, upcoming appointments, etc.  Non-urgent messages can be sent to your provider as well.   To learn more about what you can do with MyChart, go to [email or web address].    Your next appointment:   6 month(s) Aug 2022  The format for your next appointment:   In Person  Provider:   Glenetta Hew, MD

## 2020-04-08 NOTE — Progress Notes (Signed)
Primary Care Provider: [name], MD Cardiologist: Glenetta Hew, MD Electrophysiologist: None;  Has had several follow-up appointments at [facility] Note: Chief Complaint  Patient presents with  . Follow-up    First visit postop.  . Palpitations    More prominent spells of irregular heartbeat "heart wobble".    . Chest Pain    With shortness of breath-climbing stairs carrying something.   ===================================  ASSESSMENT/PLAN   Problem List Items Addressed This Visit    Persistent atrial fibrillation CHA2DS2-VASc score =5; Xarelto 15 mg (Chronic)    Per previous discussion: Plan is for rate control only with bisoprolol or other agents.  No plans for cardioversion unless she becomes unsteady.  Simply, no plans for maintenance antiarrhythmic therapy (AAT).  Continue low-dose Xarelto.  With evidence of dizzy spells, I will switch her bisoprolol from 5 mg daily to 2.5 mg twice daily, in an effort to reduce significant blood pressure drops and bradycardia.      Relevant Medications   bisoprolol (ZEBETA) 5 MG tablet   CAD (coronary artery disease) (Chronic)    Nonischemic CAD.  Moderate disease in the LAD way back in 2003 with several stress test evaluation since showing no ischemia.  No regional wall motion on echocardiogram.  Continue beta-blocker.  Not on aspirin because of Xarelto.  No anginal symptoms.      Relevant Medications   bisoprolol (ZEBETA) 5 MG tablet   Chronic diastolic heart failure (HCC) (Chronic)    Parkinsonism is pretty well controlled.  She takes her furosemide daily and maybe 2-3 times a week she will take her PRN dose of Zaroxolyn.  Otherwise really NYHA class I-II. On the sotalol with no other vascular reduction because of borderline blood pressures.      Relevant Medications   bisoprolol (ZEBETA) 5 MG tablet   Non-rheumatic mitral regurgitation (Chronic)    Follow-up echo seem to be relatively stable.  Continue  monitor.  Moderate MR with no tach or symptoms.  Preserved EF.      Relevant Medications   bisoprolol (ZEBETA) 5 MG tablet   Cardiac pacemaker (Chronic)    Pacemaker in place.  Monitored by EP.  No reports of prolonged A. fib.        ===================================  HPI:    Beverly Kim is a 87 y.o. female with a PMH notable for PERSISTENT A.  Cough On Xarelto (LBBB ->), SSS/CHB -(s/p PPM), CAD (50-70% LAD in 2003 - Med Rx), chronic HFpEF (EF improved to 60 of 65%), HTN and HLD along with hypothyroidism who presents today for 21-month follow-up.  CARDIOVASCULAR HISTORY: --> Previously followed by Dr. Percival Spanish -> transferred [facility] because of other remember being followed as well.  Cardiac Cath 2003: 50-70% LAD, nonischemic Myoview.  December 2013: Echo EF 50 and 55%.  Normal function.-Moderate MR.  Mild LA dilation.  November 2015 --> Echo for shortness of breath => reduced EF of 45 to 50%.  Mild LVH.  Inferior HK.  GRII DD.  Moderate MR.  March 2018--presented with fatigue and dyspnea, found to have third-degree AVB --> CRT-P (b/c reduced LVEF)  F/u Echo 06/2016 (see below) - EF 60-65%.   DCCV 01/04/2019: Was converted from Eliquis to Xarelto 15 mg daily.  CHA2DS2-VASc score 5: Age-78, female, HTN, aortic plaque-3.  Has since failed rhythm control at least 49 to 50% A. fib burden => she has declined DCCV or AAT  Porchea B Boylen was last seen on September 17, 2019  to establish cardiology care as part of her hospital follow-up -> she presented with abdominal pain, ERCP on May 18, then treated with IV antibiotics for choledocholithiasis, cholecystitis with sepsis..  Complicated by AKI and diuretics were held.  Rate controlled with bisoprolol=> this dose has been reduced due to fatigue and lightheadedness.  Felt better reduce dose. => Noted poor balance, especially walking on unlevel ground.  She does get dyspneic walking up stairs.  No chest pain.  Continue bisoprolol at 5 mg, and  renally dose Xarelto 15 mg.  Standing dose Lasix with intermittent metolazone  2D echo ordered  Recent Hospitalizations:   12/15/2019: Admitted for lap chole for chronic calculus cholecystitis and biliary colic.  Complicated by mild AKI with creatinine going up to 1.29.  Otherwise did relatively well. => Discharged home with a HH PT and Coy RN  Reviewed  CV studies:    The following studies were reviewed today: (if available, images/films reviewed: From Epic Chart or Care Everywhere)  Echo 10/2019: EF 60 -65%.  No RWMA.  Severe concentric LVH.  Unable to assess diastolic function.  Mild biatrial dilation.  Moderate MR moderate TR, no AS   Interval History:   MASEL HANING returns today again with her daughter.  She knows that she had a lot of issues postop with "excess sedation.  She says she took a long time to wake up.  She does remember a lot of the hospital stay.  She has been somewhat stressed since her discharge.  She had several episodes where she also feels short of breath and she cannot catch her breath.  It lasts for a few seconds and then goes away.  She also has occasional spells of irregular heartbeats.  She describes it as a "heart wobble."  She said she had about 3 bouts of this is abnormal palpitations sensations postoperatively.  She has had several episodes of dizziness where she feels like she may fall, but never did follow-up.  She has had a couple episodes of chest pressure with significant exertion.  Not with routine activity.  Usually if she is carrying something upstairs she may get short of breath with some chest discomfort, but otherwise not with routine activity.  CV Review of Symptoms (Summary) Cardiovascular ROS: positive for - chest pain, dyspnea on exertion, irregular heartbeat, palpitations, rapid heart rate and Lots of stress associate ; GERD. negative for - irregular heartbeat, orthopnea, paroxysmal nocturnal dyspnea, shortness of breath or Although she  had dizziness, no syncope or near syncope, TIA/amaurosis fugax   The patient does not have symptoms concerning for COVID-19 infection (fever, chills, cough, or new shortness of breath).   REVIEWED OF SYSTEMS   Review of Systems  Constitutional: Positive for weight loss (She lost 10 pounds since surgery). Negative for malaise/fatigue.  HENT: Negative for congestion and nosebleeds.        Loose teeth.  Cardiovascular: Positive for palpitations and leg swelling (Stable). Negative for PND.  Gastrointestinal: Positive for heartburn.  Genitourinary: Negative for frequency and hematuria.  Musculoskeletal: Positive for joint pain. Negative for falls.  Neurological: Positive for dizziness and weakness (Generalized). Negative for focal weakness.  Psychiatric/Behavioral: Positive for memory loss. Negative for depression. The patient has insomnia. The patient is not nervous/anxious.    I have reviewed and (if needed) personally updated the patient's problem list, medications, allergies, past medical and surgical history, social and family history.   PAST MEDICAL HISTORY   Past Medical History:  Diagnosis Date  .  Cardiac pacemaker 05/2016   Sick sinus syndrome/complete heart block  . Chronic heart failure with preserved ejection fraction (HFpEF) (HCC)   . Colon polyps   . Diverticulosis   . Dyspnea   . GERD (gastroesophageal reflux disease)   . Hearing loss of both ears   . Hepatic cyst   . Hiatal hernia   . Hyperlipidemia   . Hypertension   . Hypothyroidism   . Low back pain   . Non-occlusive coronary artery disease 04/2001   Cardiac Cath 04/18/2001: 50 and 70% mid LAD after D1. = Nonischemic by Myoview.  Medical management.  . Other left bundle branch block   . PAF (paroxysmal atrial fibrillation) (HCC) 08/16/2016   observed on PPM interrogation, asymptomatic, chad2vasc score is 5.  Bisoprolol for rate control, Xarelto for anticoagulation.  Marland Kitchen URI (upper respiratory infection)      PAST SURGICAL HISTORY   Past Surgical History:  Procedure Laterality Date  . CARDIOVERSION N/A 01/16/2019   Procedure: CARDIOVERSION;  Surgeon: Wendall Stade, MD;  Location: [facility] ENDOSCOPY;  Service: Cardiovascular;  Laterality: N/A;  . CHOLECYSTECTOMY N/A 12/15/2019   Procedure: LAPAROSCOPIC CHOLECYSTECTOMY;  Surgeon: Almond Lint, MD;  Location: [facility] OR;  Service: General;  Laterality: N/A;  . COLONOSCOPY    . ERCP N/A 07/21/2019   Procedure: ENDOSCOPIC RETROGRADE CHOLANGIOPANCREATOGRAPHY (ERCP);  Surgeon: Rachael Fee, MD;  Location: [facility] ENDOSCOPY;  Service: Endoscopy;  Laterality: N/A;  . HERNIA REPAIR    . LEFT HEART CATH AND CORONARY ANGIOGRAPHY  04/18/2001   Dr. Riley Kill: EF 60 to 65%.  2+ MR.  Upper LM takeoff with 90 degree bend just inside the ostium.  50-70% mid LAD after D1.  D1 is 30%.    Distal LAD wraps the apex and is free of disease.  LCx is mostly large OM branch.  RCA provides PDA and PL branches and Free of disease. -->  Presumably evaluated by Myoview that was nonischemic, therefore no PCI performed.  Marland Kitchen NM MYOVIEW LTD  01/2012   Normal EF.  Normal study.  No ischemia or infarction.  Marland Kitchen PACEMAKER IMPLANT Left 05/15/2016   SJM Assirity MRI dual chamber PPM implanted by Dr Johney Frame for CHB  . REMOVAL OF STONES  07/21/2019   Procedure: REMOVAL OF STONES;  Surgeon: Rachael Fee, MD;  Location: [facility] ENDOSCOPY;  Service: Endoscopy;;  . Dennison Mascot  07/21/2019   Procedure: Dennison Mascot;  Surgeon: Rachael Fee, MD;  Location: [facility] ENDOSCOPY;  Service: Endoscopy;;  . THYROIDECTOMY    . TONSILLECTOMY AND ADENOIDECTOMY    . TOTAL ABDOMINAL HYSTERECTOMY W/ BILATERAL SALPINGOOPHORECTOMY    . TRANSTHORACIC ECHOCARDIOGRAM  06/27/2016   Moderate basal-septal LVH, EF 60 to 65%.  No R WMA.  GR 1 DD.  Mild aortic valve calcification.  No AS.  Trivial MR.--Indicated improved EF compared to prior study in 2015.  Marland Kitchen TRANSTHORACIC ECHOCARDIOGRAM  12/'13; 11/'15   a) EF 50 and 55%.   Normal function.-Moderate MR.  Mild LA dilation.; b) Echo for shortness of breath => reduced EF of 45 to 50%.  Mild LVH.  Inferior reversible HK.  GRII DD.  Moderate MR.  . TRANSTHORACIC ECHOCARDIOGRAM  10/2019   EF 60 -65%.  No RWMA.  Severe concentric LVH.  Unable to assess diastolic function.  Mild biatrial dilation.  Moderate MR moderate TR, no AS    Immunization History  Administered Date(s) Administered  . Influenza Split 12/25/2010, 12/25/2011  . Influenza Whole 03/05/2005, 12/13/2008  . Influenza, High Dose Seasonal  PF 12/16/2012, 01/15/2017  . PFIZER(Purple Top)SARS-COV-2 Vaccination 05/03/2019, 06/03/2019  . Pneumococcal Polysaccharide-23 03/05/2002, 08/12/2007  . Td 03/06/2003  . Zoster 08/12/2007    MEDICATIONS/ALLERGIES   Current Meds  Medication Sig  . acetaminophen (TYLENOL) 650 MG CR tablet Take 650 mg by mouth every 8 (eight) hours as needed for pain.  Marland Kitchen albuterol (PROVENTIL HFA;VENTOLIN HFA) 108 (90 Base) MCG/ACT inhaler Inhale 1-2 puffs into the lungs every 6 (six) hours as needed for wheezing or shortness of breath.  . calcium carbonate (TUMS - DOSED IN MG ELEMENTAL CALCIUM) 500 MG chewable tablet Chew 1 tablet by mouth daily as needed for indigestion or heartburn.  . clotrimazole (CLOTRIMAZOLE ANTI-FUNGAL) 1 % cream Apply 1 application topically 2 (two) times daily.  . Fluocinolone Acetonide 0.01 % OIL Place 3-4 drops into both ears daily as needed for itching.  . Fluticasone-Umeclidin-Vilant (TRELEGY ELLIPTA) 100-62.5-25 MCG/INH AEPB Inhale 1 puff into the lungs daily.  . furosemide (LASIX) 40 MG tablet Take 1 tablet by mouth once daily  . ibuprofen (ADVIL) 200 MG tablet Take 200 mg by mouth every 6 (six) hours as needed for moderate pain.  . Melatonin 5 MG CAPS Take 5 mg by mouth at bedtime as needed (sleep).  . metolazone (ZAROXOLYN) 2.5 MG tablet TAKE ONE TABLET BY MOUTH THREE TIMES A WEEK (Patient taking differently: Take 2.5 mg by mouth See admin instructions.  Take 2.5 mg 3 times weekly when needed for swelling)  . potassium chloride (KLOR-CON) 10 MEQ tablet Take 1 tablet by mouth on days metolazone are taken (Patient taking differently: Take 10 mEq by mouth daily as needed (when taking metolazone).)  . SYNTHROID 88 MCG tablet TAKE 1 TABLET BY MOUTH ONCE DAILY BEFORE BREAKFAST  . traMADol (ULTRAM) 50 MG tablet Take 1 tablet (50 mg total) by mouth every 6 (six) hours as needed for moderate pain.  Marland Kitchen triamcinolone cream (KENALOG) 0.1 % Apply 1 application topically 2 (two) times daily as needed. (Patient taking differently: Apply 1 application topically 2 (two) times daily as needed (contact dermatitis).)  . vitamin C (ASCORBIC ACID) 500 MG tablet Take 500 mg by mouth daily.  Alveda Reasons 15 MG TABS tablet TAKE 1 TABLET BY MOUTH ONCE DAILY WITH SUPPER  . [DISCONTINUED] bisoprolol (ZEBETA) 5 MG tablet Take 1 tablet (5 mg total) by mouth daily.    Allergies  Allergen Reactions  . Adhesive [Tape] Itching, Dermatitis, Rash and Other (See Comments)    Blisters and "skin bubbles"  . Ace Inhibitors Cough  . Atorvastatin Other (See Comments)    REACTION: Reaction not known  . Clindamycin Other (See Comments)    Unknown  . Codeine Other (See Comments)    hallucinations   . Latex Other (See Comments)    blisters  . Shellfish Allergy Other (See Comments)    "gallbladder attack"  . Simvastatin Other (See Comments)    fatigue  . Penicillins Rash    Has patient had a PCN reaction causing immediate rash, facial/tongue/throat swelling, SOB or lightheadedness with hypotension: Unknown Has patient had a PCN reaction causing severe rash involving mucus membranes or skin necrosis: Unknown Has patient had a PCN reaction that required hospitalization: Unknown Has patient had a PCN reaction occurring within the last 10 years: No If all of the above answers are "NO", then may proceed with Cephalosporin use.     SOCIAL HISTORY/FAMILY HISTORY   Reviewed in Epic:   Pertinent findings:  Social History   Tobacco Use  . Smoking  status: Never Smoker  . Smokeless tobacco: Never Used  Vaping Use  . Vaping Use: Never used  Substance Use Topics  . Alcohol use: No  . Drug use: No   Social History   Social History Narrative  . Not on file    OBJCTIVE -PE, EKG, labs   Wt Readings from Last 3 Encounters:  04/08/20 144 lb (65.3 kg)  12/15/19 152 lb 6.4 oz (69.1 kg)  12/09/19 152 lb 6.4 oz (69.1 kg)    Physical Exam: BP (!) 124/57   Pulse 70   Ht 5\' 2"  (1.575 m)   Wt 144 lb (65.3 kg)   SpO2 100%   BMI 26.34 kg/m  Physical Exam Vitals reviewed.  Constitutional:      General: She is not in acute distress.    Appearance: Normal appearance. She is normal weight. She is not ill-appearing or toxic-appearing.  HENT:     Head: Normocephalic and atraumatic.     Ears:     Comments: Extremely hard of hearing Neck:     Vascular: No carotid bruit, hepatojugular reflux or JVD.  Cardiovascular:     Rate and Rhythm: Normal rate and regular rhythm.  No extrasystoles are present.    Chest Wall: PMI is not displaced (Unable to palpate).     Pulses: Normal pulses.     Heart sounds: Normal heart sounds. No murmur heard. No friction rub.     Comments: Normal S1, split S2 (paced) Pulmonary:     Effort: Pulmonary effort is normal. No respiratory distress.     Breath sounds: Normal breath sounds.  Chest:     Chest wall: No tenderness.  Musculoskeletal:        General: Swelling (Trivial ankle) present. Normal range of motion.     Cervical back: Normal range of motion and neck supple.  Skin:    General: Skin is warm and dry.  Neurological:     General: No focal deficit present.     Mental Status: She is alert and oriented to person, place, and time.     Gait: Gait abnormal (Slow, unsteady).  Psychiatric:        Mood and Affect: Mood normal.        Behavior: Behavior normal.        Judgment: Judgment normal.     Comments: Somewhat diffuse.   Short-term memory loss     Adult ECG Report n/a  Recent Labs: Not available Lab Results  Component Value Date   CHOL 205 (H) 08/06/2018   HDL 41.70 08/06/2018   LDLCALC 132 (H) 08/06/2018   LDLDIRECT 163.3 01/30/2012   TRIG 156.0 (H) 08/06/2018   CHOLHDL 5 08/06/2018   Lab Results  Component Value Date   CREATININE 1.09 (H) 12/17/2019   BUN 21 12/17/2019   NA 130 (L) 12/17/2019   K 4.7 12/17/2019   CL 99 12/17/2019   CO2 23 12/17/2019   CBC Latest Ref Rng & Units 12/17/2019 12/16/2019 12/09/2019  WBC 4.0 - 10.5 K/uL 10.9(H) 12.2(H) 7.7  Hemoglobin 12.0 - 15.0 g/dL 10.5(L) 11.5(L) 11.8(L)  Hematocrit 36.0 - 46.0 % 32.7(L) 36.5 38.0  Platelets 150 - 400 K/uL 134(L) 154 196    Lab Results  Component Value Date   TSH 3.20 08/05/2019    ==================================================  COVID-19 Education: The signs and symptoms of COVID-19 were discussed with the patient and how to seek care for testing (follow up with PCP or arrange E-visit).   The importance of social  distancing and COVID-19 vaccination was discussed today. The patient is practicing social distancing & Masking.   I spent a total of 55minutes with the patient spent in direct patient consultation.  Additional time spent with chart review  / charting (studies, outside notes, etc): 27 min > Difficult note, patient still relatively new to me with multiple cardiac problems.  Extensive chart review with studies reviewed. Total Time: 34min   Current medicines are reviewed at length with the patient today.  (+/- concerns) n/a  This visit occurred during the SARS-CoV-2 public health emergency.  Safety protocols were in place, including screening questions prior to the visit, additional usage of staff PPE, and extensive cleaning of exam room while observing appropriate contact time as indicated for disinfecting solutions.  Notice: This dictation was prepared with Dragon dictation along with smaller phrase  technology. Any transcriptional errors that result from this process are unintentional and may not be corrected upon review.  Patient Instructions / Medication Changes & Studies & Tests Ordered   Patient Instructions  Medication Instructions:    decrease Bisoprolol to 2.5 mg ( 1/2 tablet of 5 mg ) twice a day   *If you need a refill on your cardiac medications before your next appointment, please call your pharmacy*  Other Instructions Hydrate  Drink about 6 to 8 glasses of 8 oz of water a day    Lab Work:    Testing/Procedures: Not needed   Follow-Up: At [facility], you and your health needs are our priority.  As part of our continuing mission to provide you with exceptional heart care, we have created designated Provider Care Teams.  These Care Teams include your primary Cardiologist (physician) and Advanced Practice Providers (APPs -  Physician Assistants and Nurse Practitioners) who all work together to provide you with the care you need, when you need it.  We recommend signing up for the patient portal called "MyChart".  Sign up information is provided on this After Visit Summary.  MyChart is used to connect with patients for Virtual Visits (Telemedicine).  Patients are able to view lab/test results, encounter notes, upcoming appointments, etc.  Non-urgent messages can be sent to your provider as well.   To learn more about what you can do with MyChart, go to [email or web address].    Your next appointment:   6 month(s) Aug 2022  The format for your next appointment:   In Person  Provider:   Glenetta Hew, MD      Studies Ordered:   No orders of the defined types were placed in this encounter.    Glenetta Hew, M.D., M.S. Interventional Cardiologist   Pager # [phone] Phone # [phone] [address] [place], [place] [address]   Thank you for choosing [facility] at [facility]!!

## 2020-04-18 ENCOUNTER — Encounter: Payer: Self-pay | Admitting: Cardiology

## 2020-04-18 NOTE — Assessment & Plan Note (Signed)
Parkinsonism is pretty well controlled.  She takes her furosemide daily and maybe 2-3 times a week she will take her PRN dose of Zaroxolyn.  Otherwise really NYHA class I-II. On the sotalol with no other vascular reduction because of borderline blood pressures.

## 2020-04-18 NOTE — Assessment & Plan Note (Signed)
Per previous discussion: Plan is for rate control only with bisoprolol or other agents.  No plans for cardioversion unless she becomes unsteady.  Simply, no plans for maintenance antiarrhythmic therapy (AAT).  Continue low-dose Xarelto.  With evidence of dizzy spells, I will switch her bisoprolol from 5 mg daily to 2.5 mg twice daily, in an effort to reduce significant blood pressure drops and bradycardia.

## 2020-04-18 NOTE — Assessment & Plan Note (Signed)
Follow-up echo seem to be relatively stable.  Continue monitor.  Moderate MR with no tach or symptoms.  Preserved EF.

## 2020-04-18 NOTE — Assessment & Plan Note (Signed)
Nonischemic CAD.  Moderate disease in the LAD way back in 2003 with several stress test evaluation since showing no ischemia.  No regional wall motion on echocardiogram.  Continue beta-blocker.  Not on aspirin because of Xarelto.  No anginal symptoms.

## 2020-04-18 NOTE — Assessment & Plan Note (Signed)
Pacemaker in place.  Monitored by EP.  No reports of prolonged A. fib.

## 2020-05-05 DIAGNOSIS — H26491 Other secondary cataract, right eye: Secondary | ICD-10-CM | POA: Diagnosis not present

## 2020-05-05 DIAGNOSIS — H53002 Unspecified amblyopia, left eye: Secondary | ICD-10-CM | POA: Diagnosis not present

## 2020-05-05 DIAGNOSIS — E119 Type 2 diabetes mellitus without complications: Secondary | ICD-10-CM | POA: Diagnosis not present

## 2020-05-05 DIAGNOSIS — H52203 Unspecified astigmatism, bilateral: Secondary | ICD-10-CM | POA: Diagnosis not present

## 2020-05-05 LAB — HM DIABETES EYE EXAM

## 2020-05-06 DIAGNOSIS — I4819 Other persistent atrial fibrillation: Secondary | ICD-10-CM | POA: Diagnosis not present

## 2020-05-06 DIAGNOSIS — N183 Chronic kidney disease, stage 3 unspecified: Secondary | ICD-10-CM | POA: Diagnosis not present

## 2020-05-06 DIAGNOSIS — E1122 Type 2 diabetes mellitus with diabetic chronic kidney disease: Secondary | ICD-10-CM | POA: Diagnosis not present

## 2020-05-06 DIAGNOSIS — E261 Secondary hyperaldosteronism: Secondary | ICD-10-CM | POA: Diagnosis not present

## 2020-05-06 DIAGNOSIS — D692 Other nonthrombocytopenic purpura: Secondary | ICD-10-CM | POA: Diagnosis not present

## 2020-05-06 DIAGNOSIS — D6869 Other thrombophilia: Secondary | ICD-10-CM | POA: Diagnosis not present

## 2020-05-06 DIAGNOSIS — R42 Dizziness and giddiness: Secondary | ICD-10-CM | POA: Diagnosis not present

## 2020-05-06 DIAGNOSIS — I2510 Atherosclerotic heart disease of native coronary artery without angina pectoris: Secondary | ICD-10-CM | POA: Diagnosis not present

## 2020-05-06 DIAGNOSIS — I442 Atrioventricular block, complete: Secondary | ICD-10-CM | POA: Diagnosis not present

## 2020-05-06 DIAGNOSIS — I495 Sick sinus syndrome: Secondary | ICD-10-CM | POA: Diagnosis not present

## 2020-05-06 DIAGNOSIS — I5030 Unspecified diastolic (congestive) heart failure: Secondary | ICD-10-CM | POA: Diagnosis not present

## 2020-05-06 DIAGNOSIS — E1151 Type 2 diabetes mellitus with diabetic peripheral angiopathy without gangrene: Secondary | ICD-10-CM | POA: Diagnosis not present

## 2020-05-12 ENCOUNTER — Encounter: Payer: Self-pay | Admitting: Internal Medicine

## 2020-05-12 NOTE — Progress Notes (Signed)
Outside notes received. Information abstracted. Notes sent to scan.  

## 2020-05-20 ENCOUNTER — Ambulatory Visit (INDEPENDENT_AMBULATORY_CARE_PROVIDER_SITE_OTHER): Payer: PPO

## 2020-05-20 DIAGNOSIS — I5032 Chronic diastolic (congestive) heart failure: Secondary | ICD-10-CM | POA: Diagnosis not present

## 2020-05-20 LAB — CUP PACEART REMOTE DEVICE CHECK
Battery Remaining Longevity: 114 mo
Battery Remaining Percentage: 95.5 %
Battery Voltage: 2.98 V
Brady Statistic AP VP Percent: 55 %
Brady Statistic AP VS Percent: 1 %
Brady Statistic AS VP Percent: 42 %
Brady Statistic AS VS Percent: 1 %
Brady Statistic RA Percent Paced: 12 %
Brady Statistic RV Percent Paced: 96 %
Date Time Interrogation Session: 20220318050016
Implantable Lead Implant Date: 20180313
Implantable Lead Implant Date: 20180313
Implantable Lead Location: 753859
Implantable Lead Location: 753860
Implantable Pulse Generator Implant Date: 20180313
Lead Channel Impedance Value: 360 Ohm
Lead Channel Impedance Value: 410 Ohm
Lead Channel Pacing Threshold Amplitude: 0.875 V
Lead Channel Pacing Threshold Amplitude: 1.25 V
Lead Channel Pacing Threshold Pulse Width: 0.5 ms
Lead Channel Pacing Threshold Pulse Width: 0.5 ms
Lead Channel Sensing Intrinsic Amplitude: 0.6 mV
Lead Channel Sensing Intrinsic Amplitude: 10.5 mV
Lead Channel Setting Pacing Amplitude: 1.125
Lead Channel Setting Pacing Amplitude: 2.5 V
Lead Channel Setting Pacing Pulse Width: 0.5 ms
Lead Channel Setting Sensing Sensitivity: 4 mV
Pulse Gen Model: 2272
Pulse Gen Serial Number: 8005625

## 2020-05-27 NOTE — Progress Notes (Signed)
Remote pacemaker transmission.   

## 2020-05-31 ENCOUNTER — Encounter (HOSPITAL_COMMUNITY): Payer: Self-pay

## 2020-05-31 ENCOUNTER — Telehealth: Payer: Self-pay | Admitting: Internal Medicine

## 2020-05-31 ENCOUNTER — Other Ambulatory Visit: Payer: Self-pay

## 2020-05-31 ENCOUNTER — Observation Stay (HOSPITAL_COMMUNITY)
Admission: EM | Admit: 2020-05-31 | Discharge: 2020-06-02 | Disposition: A | Discharge status: Home / Self Care | Payer: PPO | Attending: Internal Medicine | Admitting: Internal Medicine

## 2020-05-31 DIAGNOSIS — Z20822 Contact with and (suspected) exposure to covid-19: Secondary | ICD-10-CM | POA: Diagnosis not present

## 2020-05-31 DIAGNOSIS — I5042 Chronic combined systolic (congestive) and diastolic (congestive) heart failure: Secondary | ICD-10-CM | POA: Diagnosis present

## 2020-05-31 DIAGNOSIS — I110 Hypertensive heart disease with heart failure: Secondary | ICD-10-CM | POA: Diagnosis not present

## 2020-05-31 DIAGNOSIS — K922 Gastrointestinal hemorrhage, unspecified: Secondary | ICD-10-CM | POA: Diagnosis not present

## 2020-05-31 DIAGNOSIS — I5032 Chronic diastolic (congestive) heart failure: Secondary | ICD-10-CM | POA: Diagnosis not present

## 2020-05-31 DIAGNOSIS — E119 Type 2 diabetes mellitus without complications: Secondary | ICD-10-CM | POA: Insufficient documentation

## 2020-05-31 DIAGNOSIS — E039 Hypothyroidism, unspecified: Secondary | ICD-10-CM | POA: Diagnosis not present

## 2020-05-31 DIAGNOSIS — Z91040 Latex allergy status: Secondary | ICD-10-CM | POA: Insufficient documentation

## 2020-05-31 DIAGNOSIS — K625 Hemorrhage of anus and rectum: Secondary | ICD-10-CM | POA: Diagnosis present

## 2020-05-31 DIAGNOSIS — I10 Essential (primary) hypertension: Secondary | ICD-10-CM | POA: Diagnosis not present

## 2020-05-31 DIAGNOSIS — I2510 Atherosclerotic heart disease of native coronary artery without angina pectoris: Secondary | ICD-10-CM | POA: Insufficient documentation

## 2020-05-31 DIAGNOSIS — Z7901 Long term (current) use of anticoagulants: Secondary | ICD-10-CM | POA: Diagnosis not present

## 2020-05-31 DIAGNOSIS — I5043 Acute on chronic combined systolic (congestive) and diastolic (congestive) heart failure: Secondary | ICD-10-CM | POA: Diagnosis present

## 2020-05-31 DIAGNOSIS — K219 Gastro-esophageal reflux disease without esophagitis: Secondary | ICD-10-CM | POA: Diagnosis present

## 2020-05-31 DIAGNOSIS — Z86010 Personal history of colonic polyps: Secondary | ICD-10-CM | POA: Insufficient documentation

## 2020-05-31 DIAGNOSIS — R0781 Pleurodynia: Secondary | ICD-10-CM

## 2020-05-31 DIAGNOSIS — I480 Paroxysmal atrial fibrillation: Secondary | ICD-10-CM | POA: Diagnosis present

## 2020-05-31 DIAGNOSIS — I4891 Unspecified atrial fibrillation: Secondary | ICD-10-CM | POA: Diagnosis present

## 2020-05-31 DIAGNOSIS — Z79899 Other long term (current) drug therapy: Secondary | ICD-10-CM | POA: Insufficient documentation

## 2020-05-31 DIAGNOSIS — I4819 Other persistent atrial fibrillation: Secondary | ICD-10-CM | POA: Diagnosis present

## 2020-05-31 LAB — POC OCCULT BLOOD, ED: Fecal Occult Bld: POSITIVE — AB

## 2020-05-31 LAB — COMPREHENSIVE METABOLIC PANEL
ALT: 19 U/L (ref 0–44)
AST: 25 U/L (ref 15–41)
Albumin: 4 g/dL (ref 3.5–5.0)
Alkaline Phosphatase: 77 U/L (ref 38–126)
Anion gap: 10 (ref 5–15)
BUN: 30 mg/dL — ABNORMAL HIGH (ref 8–23)
CO2: 24 mmol/L (ref 22–32)
Calcium: 9.6 mg/dL (ref 8.9–10.3)
Chloride: 103 mmol/L (ref 98–111)
Creatinine, Ser: 1.15 mg/dL — ABNORMAL HIGH (ref 0.44–1.00)
GFR, Estimated: 44 mL/min — ABNORMAL LOW (ref >60)
Glucose, Bld: 118 mg/dL — ABNORMAL HIGH (ref 70–99)
Potassium: 3.9 mmol/L (ref 3.5–5.1)
Sodium: 137 mmol/L (ref 135–145)
Total Bilirubin: 1.3 mg/dL — ABNORMAL HIGH (ref 0.3–1.2)
Total Protein: 7 g/dL (ref 6.5–8.1)

## 2020-05-31 LAB — CBC
HCT: 34.4 % — ABNORMAL LOW (ref 36.0–46.0)
Hemoglobin: 11.4 g/dL — ABNORMAL LOW (ref 12.0–15.0)
MCH: 32.7 pg (ref 26.0–34.0)
MCHC: 33.1 g/dL (ref 30.0–36.0)
MCV: 98.6 fL (ref 80.0–100.0)
Platelets: 152 10*3/uL (ref 150–400)
RBC: 3.49 MIL/uL — ABNORMAL LOW (ref 3.87–5.11)
RDW: 15.2 % (ref 11.5–15.5)
WBC: 6.3 10*3/uL (ref 4.0–10.5)
nRBC: 0 % (ref 0.0–0.2)

## 2020-05-31 LAB — PROTIME-INR
INR: 1.7 — ABNORMAL HIGH (ref 0.8–1.2)
Prothrombin Time: 19.2 seconds — ABNORMAL HIGH (ref 11.4–15.2)

## 2020-05-31 LAB — TYPE AND SCREEN
ABO/RH(D): O POS
Antibody Screen: NEGATIVE

## 2020-05-31 LAB — HEMOGLOBIN AND HEMATOCRIT, BLOOD
HCT: 33.3 % — ABNORMAL LOW (ref 36.0–46.0)
Hemoglobin: 11.2 g/dL — ABNORMAL LOW (ref 12.0–15.0)

## 2020-05-31 LAB — SARS CORONAVIRUS 2 (TAT 6-24 HRS): SARS Coronavirus 2: NEGATIVE

## 2020-05-31 MED ORDER — PANTOPRAZOLE SODIUM 40 MG IV SOLR: 40.0000 mg | Freq: Two times a day (BID) | INTRAVENOUS | Status: DC

## 2020-05-31 MED ORDER — FLUTICASONE FUROATE-VILANTEROL 100-25 MCG/INH IN AEPB
1.0000 | INHALATION_SPRAY | Freq: Every day | RESPIRATORY_TRACT | Status: DC
  Administered 2020-06-01 – 2020-06-02 (×2): 1 via RESPIRATORY_TRACT
  Filled 2020-05-31: qty 28

## 2020-05-31 MED ORDER — UMECLIDINIUM BROMIDE 62.5 MCG/INH IN AEPB
1.0000 | INHALATION_SPRAY | Freq: Every day | RESPIRATORY_TRACT | Status: DC
  Filled 2020-05-31: qty 7

## 2020-05-31 MED ORDER — LEVOTHYROXINE SODIUM 88 MCG PO TABS
88.0000 ug | ORAL_TABLET | Freq: Every day | ORAL | Status: DC
  Administered 2020-06-01 – 2020-06-02 (×2): 88 ug via ORAL
  Filled 2020-05-31 (×2): qty 1

## 2020-05-31 MED ORDER — SODIUM CHLORIDE 0.9 % IV SOLN: INTRAVENOUS | Status: DC

## 2020-05-31 MED ORDER — ALBUTEROL SULFATE HFA 108 (90 BASE) MCG/ACT IN AERS
1.0000 | INHALATION_SPRAY | Freq: Four times a day (QID) | RESPIRATORY_TRACT | Status: DC | PRN
Start: 2020-05-31 — End: 2020-06-02
  Filled 2020-05-31: qty 6.7

## 2020-05-31 MED ORDER — FLUTICASONE-UMECLIDIN-VILANT 100-62.5-25 MCG/INH IN AEPB: 1.0000 | INHALATION_SPRAY | Freq: Every day | RESPIRATORY_TRACT | Status: DC

## 2020-05-31 MED ORDER — BISOPROLOL FUMARATE 5 MG PO TABS
2.5000 mg | ORAL_TABLET | Freq: Two times a day (BID) | ORAL | Status: DC
  Administered 2020-06-01 – 2020-06-02 (×3): 2.5 mg via ORAL
  Filled 2020-05-31 (×5): qty 0.5

## 2020-05-31 MED ORDER — MELATONIN 5 MG PO TABS
5.0000 mg | ORAL_TABLET | Freq: Every evening | ORAL | Status: DC | PRN
Start: 2020-05-31 — End: 2020-06-02
  Filled 2020-05-31: qty 1

## 2020-05-31 NOTE — Consult Note (Addendum)
[facility] Consult: 8:17 AM 06/01/2020  LOS: 0 days    Referring Provider: Dr   Primary Care Physician:  [name], MD Primary Gastroenterologist:  Dr. Ardis Hughs     Reason for Consultation:  Bleeding per rectum.     HPI: Beverly Kim is a 87 y.o. female.  PMH congestive heart failure.  A. fib on Xarelto.  CAD.  S/p pacemaker.  CKD stage 3b.  Thyroidectomy .  TAH/SPO.    Diverticulosis.  Colon polyps.  GERD.    2004 Colonoscopy: Sigmoid diverticulosis.   2011 Colonoscopy: severe sigmoid diverticulosis a/w luminal narrowing.  Sessile polyp at 15 cm.   Path: polypoid colorectal mucosa w intrmucosal lymphoid aggregates.   Choledocholithiasis.   07/2019 ERCP with sphincterotomy and stone extraction.  Treated with antibiotics.  12/2019 lap chole.    Starting Wednesday she was seeing blood in her stool and the toilet water would turn red after a bowel movement.  Texture of the stool was softer and frequency of hematochezia was 3 or 4 times a day.  Normal bowel movements are twice a day.  Yesterday morning the patient passed a brown, nonbloody stool.  However there was burgundy blood/fresh blood on DRE in the ED.  For at least a couple of months pt reports pain in her right upper quadrant that radiates to the scapula.  It occurs in a pulsing manner.  In recent weeks it has become more frequent.  Appetite is good.  However since April 2021 she is lost 37 pounds despite eating 3 meals and a snack daily.  She has chronic intermittent dyspnea at rest.  No PND, sleeps with head of bed elevated about 6 inches.   Also reported nosebleeds, gum bleeding.  At arrival to ED BPs 1teens to 130s/60s to 73s.  HR in 70s.   Hgb 11.4 >> 11.2.  10.5 in October 2021.   MCV 98.  Normal WBCs and platelets. PT/INR 19.2/1.7. AKI with  BUN/creatinine 30/1.1, GFR 44. [place] as well as some bright red blood on DRE per Dr. Sabra Heck in the ED.   Patient does not use NSAIDs.  She does not drink alcohol.  She lives independently in her own home which is 3 miles from her daughter's home.  She prepares her meals but no longer cleans her house.  Still driving short distances to [employer], beauty parlor, church.  Last dose of Xarelto was the evening of 3/28  Past Medical History:  Diagnosis Date  . Cardiac pacemaker 05/2016   Sick sinus syndrome/complete heart block  . Chronic heart failure with preserved ejection fraction (HFpEF) ([place])   . Colon polyps   . Diverticulosis   . Dyspnea   . GERD (gastroesophageal reflux disease)   . Hearing loss of both ears   . Hepatic cyst   . Hiatal hernia   . Hyperlipidemia   . Hypertension   . Hypothyroidism   . Low back pain   . Non-occlusive coronary artery disease 04/2001   Cardiac Cath 04/18/2001: 50 and 70% mid LAD after D1. =  Nonischemic by Myoview.  Medical management.  . Other left bundle branch block   . PAF (paroxysmal atrial fibrillation) ([place]) 08/16/2016   observed on PPM interrogation, asymptomatic, chad2vasc score is 5.  Bisoprolol for rate control, Xarelto for anticoagulation.  Marland Kitchen URI (upper respiratory infection)     Past Surgical History:  Procedure Laterality Date  . CARDIOVERSION N/A 01/16/2019   Procedure: CARDIOVERSION;  Surgeon: Josue Hector, MD;  Location: [facility] ENDOSCOPY;  Service: Cardiovascular;  Laterality: N/A;  . CHOLECYSTECTOMY N/A 12/15/2019   Procedure: LAPAROSCOPIC CHOLECYSTECTOMY;  Surgeon: Stark Klein, MD;  Location: [facility];  Service: General;  Laterality: N/A;  . COLONOSCOPY    . ERCP N/A 07/21/2019   Procedure: ENDOSCOPIC RETROGRADE CHOLANGIOPANCREATOGRAPHY (ERCP);  Surgeon: Milus Banister, MD;  Location: [facility];  Service: Endoscopy;  Laterality: N/A;  . HERNIA REPAIR    . LEFT HEART CATH AND CORONARY ANGIOGRAPHY  04/18/2001   Dr.  Lia Foyer: EF 60 to 65%.  2+ MR.  Upper LM takeoff with 90 degree bend just inside the ostium.  50-70% mid LAD after D1.  D1 is 30%.    Distal LAD wraps the apex and is free of disease.  LCx is mostly large OM branch.  RCA provides PDA and PL branches and Free of disease. -->  Presumably evaluated by Myoview that was nonischemic, therefore no PCI performed.  Marland Kitchen NM MYOVIEW LTD  01/2012   Normal EF.  Normal study.  No ischemia or infarction.  Marland Kitchen PACEMAKER IMPLANT Left 05/15/2016   SJM Assirity MRI dual chamber PPM implanted by Dr Rayann Heman for CHB  . REMOVAL OF STONES  07/21/2019   Procedure: REMOVAL OF STONES;  Surgeon: Milus Banister, MD;  Location: [facility] ENDOSCOPY;  Service: Endoscopy;;  . [name]  07/21/2019   Procedure: [name];  Surgeon: Milus Banister, MD;  Location: [facility] ENDOSCOPY;  Service: Endoscopy;;  . THYROIDECTOMY    . TONSILLECTOMY AND ADENOIDECTOMY    . TOTAL ABDOMINAL HYSTERECTOMY W/ BILATERAL SALPINGOOPHORECTOMY    . TRANSTHORACIC ECHOCARDIOGRAM  06/27/2016   Moderate basal-septal LVH, EF 60 to 65%.  No R WMA.  GR 1 DD.  Mild aortic valve calcification.  No AS.  Trivial MR.--Indicated improved EF compared to prior study in 2015.  Marland Kitchen TRANSTHORACIC ECHOCARDIOGRAM  12/'13; 11/'15   a) EF 50 and 55%.  Normal function.-Moderate MR.  Mild LA dilation.; b) Echo for shortness of breath => reduced EF of 45 to 50%.  Mild LVH.  Inferior reversible HK.  GRII DD.  Moderate MR.  . TRANSTHORACIC ECHOCARDIOGRAM  10/2019   EF 60 -65%.  No RWMA.  Severe concentric LVH.  Unable to assess diastolic function.  Mild biatrial dilation.  Moderate MR moderate TR, no AS    Prior to Admission medications   Medication Sig Start Date End Date Taking? Authorizing Provider  acetaminophen (TYLENOL) 650 MG CR tablet Take 650 mg by mouth every 8 (eight) hours as needed for pain.    [provider]  albuterol (PROVENTIL HFA;VENTOLIN HFA) 108 (90 Base) MCG/ACT inhaler Inhale 1-2 puffs into the lungs  every 6 (six) hours as needed for wheezing or shortness of breath. 03/05/17   Sherwood Gambler, MD  bisoprolol (ZEBETA) 5 MG tablet Take 0.5 tablets (2.5 mg total) by mouth 2 (two) times daily. 04/08/20   Leonie Man, MD  calcium carbonate (TUMS - DOSED IN MG ELEMENTAL CALCIUM) 500 MG chewable tablet Chew 1 tablet by mouth daily as needed for indigestion or heartburn.  [provider]  clotrimazole (CLOTRIMAZOLE ANTI-FUNGAL) 1 % cream Apply 1 application topically 2 (two) times daily. 12/09/19   [name], MD  Fluocinolone Acetonide 0.01 % OIL Place 3-4 drops into both ears daily as needed for itching. 11/13/19   [provider]  Fluticasone-Umeclidin-Vilant (TRELEGY ELLIPTA) 100-62.5-25 MCG/INH AEPB Inhale 1 puff into the lungs daily. 02/05/19   Marshell Garfinkel, MD  furosemide (LASIX) 40 MG tablet Take 1 tablet by mouth once daily 12/03/19   Leonie Man, MD  ibuprofen (ADVIL) 200 MG tablet Take 200 mg by mouth every 6 (six) hours as needed for moderate pain.    [provider]  Melatonin 5 MG CAPS Take 5 mg by mouth at bedtime as needed (sleep).    [provider]  metolazone (ZAROXOLYN) 2.5 MG tablet TAKE ONE TABLET BY MOUTH THREE TIMES A WEEK Patient taking differently: Take 2.5 mg by mouth See admin instructions. Take 2.5 mg 3 times weekly when needed for swelling 06/12/19   Fenton, Clint R, PA  potassium chloride (KLOR-CON) 10 MEQ tablet Take 1 tablet by mouth on days metolazone are taken Patient taking differently: Take 10 mEq by mouth daily as needed (when taking metolazone). 06/19/19   Fenton, Clint R, PA  SYNTHROID 88 MCG tablet TAKE 1 TABLET BY MOUTH ONCE DAILY BEFORE BREAKFAST 12/03/19   Burns, Claudina Lick, MD  traMADol (ULTRAM) 50 MG tablet Take 1 tablet (50 mg total) by mouth every 6 (six) hours as needed for moderate pain. 12/17/19   Stark Klein, MD  triamcinolone cream (KENALOG) 0.1 % Apply 1 application topically 2 (two) times daily as  needed. Patient taking differently: Apply 1 application topically 2 (two) times daily as needed (contact dermatitis). 09/11/19   [name], MD  vitamin C (ASCORBIC ACID) 500 MG tablet Take 500 mg by mouth daily.    [provider]  XARELTO 15 MG TABS tablet TAKE 1 TABLET BY MOUTH ONCE DAILY WITH SUPPER 12/23/19   Fenton, [place] R, PA    Scheduled Meds:  Infusions:  PRN Meds:    Allergies as of 05/31/2020 - Review Complete 05/31/2020  Allergen Reaction Noted  . Adhesive [tape] Itching, Dermatitis, Rash, and Other (See Comments)   . Ace inhibitors Cough 05/27/2016  . Atorvastatin Other (See Comments)   . Clindamycin Other (See Comments) 07/06/2009  . Codeine Other (See Comments) 12/02/2006  . Latex Other (See Comments) 08/12/2007  . Shellfish allergy Other (See Comments) 07/25/2017  . Simvastatin Other (See Comments) 07/06/2009  . Penicillins Rash 12/02/2006    Family History  Problem Relation Age of Onset  . Coronary artery disease Other        family hx of 1st degree relative <50  . Diabetes Other        family hx of  . Other Other        cardiovascular disorder family hx of  . Other Other        neurological disorder family hx of  . Other Other        respiratory disease family hx of  . Heart attack Daughter   . Heart Problems Other        all children  . Sudden death Son   . Heart disease Brother   . Heart disease Sister   . Colon cancer Neg Hx   . Colon polyps Neg Hx     Social History   Socioeconomic History  . Marital status: Widowed  Spouse name: Not on file  . Number of children: 4  . Years of education: Not on file  . Highest education level: Not on file  Occupational History  . Occupation: retired  Tobacco Use  . Smoking status: Never Smoker  . Smokeless tobacco: Never Used  Vaping Use  . Vaping Use: Never used  Substance and Sexual Activity  . Alcohol use: No  . Drug use: No  . Sexual activity: Never  Other Topics Concern  .  Not on file  Social History Narrative  . Not on file   Social Determinants of Health   Financial Resource Strain: Low Risk   . Difficulty of Paying Living Expenses: Not hard at all  Food Insecurity: No Food Insecurity  . Worried About Charity fundraiser in the Last Year: Never true  . Ran Out of Food in the Last Year: Never true  Transportation Needs: No Transportation Needs  . Lack of Transportation (Medical): No  . Lack of Transportation (Non-Medical): No  Physical Activity: Not on file  Stress: No Stress Concern Present  . Feeling of Stress : Not at all  Social Connections: Moderately Integrated  . Frequency of Communication with Friends and Family: More than three times a week  . Frequency of Social Gatherings with Friends and Family: More than three times a week  . Attends Religious Services: More than 4 times per year  . Active Member of Clubs or Organizations: Yes  . Attends Archivist Meetings: More than 4 times per year  . Marital Status: Widowed  Intimate Partner Violence: Not on file    REVIEW OF SYSTEMS: Constitutional: No profound fatigue or weakness. ENT: Periodically sees blood when she blows her nose but no true epistaxis. Pulm: See HPI. CV:  No palpitations, no LE edema.  No angina GU:  No hematuria, no frequency GI: See HPI Heme: See HPI for recent GI bleeding and periodic ENT bleeding Transfusions: None. Neuro:  No headaches, no peripheral tingling or numbness.  No syncope, no seizures. Derm:  No itching, no rash or sores.  Endocrine:  No sweats or chills.  No polyuria or dysuria Immunization: [employer] COVID-19 vaccines in February, March 2021.  Has not received booster.    PHYSICAL EXAM: Vital signs in last 24 hours: Vitals:   06/01/20 0655 06/01/20 0802  BP: 123/65 (!) 118/51  Pulse: 77 70  Resp: 16 18  Temp: 98.1 F (36.7 C) 97.7 F (36.5 C)  SpO2: 98% 100%   Wt Readings from Last 3 Encounters:  04/08/20 65.3 kg   12/15/19 69.1 kg  12/09/19 69.1 kg    General: Pleasant, cooperative, looks well and younger than stated age.  Hard of hearing Head: No facial asymmetry or swelling.  No signs of head trauma. Eyes: No scleral icterus.  No conjunctival pallor.  EOMI Ears: Hard of hearing Nose: No congestion or discharge Mouth: Excellent dentition and dental repair.  Tongue midline.  Mucosa is moist, pink.  There is a nonspecific white/gray coating on the tongue (does not look like Candida). Neck: No JVD, no masses, no thyromegaly Lungs: Excellent breath sounds.  Clear bilaterally.  No cough or dyspnea Heart: RRR.  No MRG.  S1, S2 present Abdomen: Soft.  Not distended.  No HSM, masses, bruits, hernias.  Focal tenderness in the right upper quadrant.  No CVA tenderness..   Rectal: Deferred. Musc/Skeltl: No joint redness, swelling or gross deformity. Extremities: No CCE. Neurologic: Alert.  Oriented x3.  Moves  all 4 limbs without gross deficit though strength not tested.  No tremors. Skin: No rash, no sores, no suspicious lesions, no telangiectasia. Nodes: No cervical adenopathy Psych: Calm, pleasant, cooperative, fluid speech.  Intake/Output from previous day: No intake/output data recorded. Intake/Output this shift: No intake/output data recorded.  LAB RESULTS: Recent Labs    05/31/20 1339 05/31/20 2112  WBC 6.3  --   HGB 11.4* 11.2*  HCT 34.4* 33.3*  PLT 152  --    BMET Lab Results  Component Value Date   NA 137 05/31/2020   NA 130 (L) 12/17/2019   NA 135 12/16/2019   K 3.9 05/31/2020   K 4.7 12/17/2019   K 4.6 12/16/2019   CL 103 05/31/2020   CL 99 12/17/2019   CL 100 12/16/2019   CO2 24 05/31/2020   CO2 23 12/17/2019   CO2 26 12/16/2019   GLUCOSE 118 (H) 05/31/2020   GLUCOSE 119 (H) 12/17/2019   GLUCOSE 129 (H) 12/16/2019   BUN 30 (H) 05/31/2020   BUN 21 12/17/2019   BUN 26 (H) 12/16/2019   CREATININE 1.15 (H) 05/31/2020   CREATININE 1.09 (H) 12/17/2019   CREATININE 1.29  (H) 12/16/2019   CALCIUM 9.6 05/31/2020   CALCIUM 8.7 (L) 12/17/2019   CALCIUM 9.1 12/16/2019   LFT Recent Labs    05/31/20 1339  PROT 7.0  ALBUMIN 4.0  AST 25  ALT 19  ALKPHOS 77  BILITOT 1.3*   PT/INR Lab Results  Component Value Date   INR 1.7 (H) 05/31/2020   INR 1.1 12/09/2019   INR 1.5 (H) 07/19/2019   Hepatitis Panel No results for input(s): HEPBSAG, HCVAB, HEPAIGM, HEPBIGM in the last 72 hours. C-Diff No components found for: CDIFF Lipase     Component Value Date/Time   LIPASE 33 07/31/2019 1701    Drugs of Abuse  No results found for: LABOPIA, COCAINSCRNUR, LABBENZ, AMPHETMU, THCU, LABBARB   RADIOLOGY STUDIES: No results found.    IMPRESSION:   *    [place], bright red bloody stool.  Suspect diverticular bleed.  However her BUN is elevated though so is the creatinine which raises small possibility that this is an upper GI bleed.  Several weeks of right upper quadrant pain radiating to the scapula is a separate problem from the bleeding. At most recent colonoscopy 2011 had severe sigmoid diverticulosis and a nonadenomatous colon polyp.  *    Chronic right upper quadrant pain radiating to the scapula.  ERCP, stone extraction, sphincterotomy 07/2019.  Lap chole 12/2019.  T bili slightly elevated at 1.3 but otherwise normal LFTs.  Despite preserved appetite she has lost by her report 37 pounds over the last 12 months.  Unable to attribute weight loss to diuretics.  *    Minor anemia, normal MCV.  Hgb actually stable to improved from October 20/2021  *   CKD stage IIIb.  *   S/p cardiac pacemaker  *   Chronic Xarelto.  On hold.  Last dose Monday 3/20  in the evening.  *    CHF, no clinical indication of exacerbation.    PLAN:     *   ?  CTAP to assess the RUQ pain, could possibly point to a source for the bleeding though this is unlikely.  *    at her age probably will not repeat colonoscopy but Dr. Lyndel Safe can decide this.  *     ?  Pursue EGD  just to rule out the less likely  scenario of upper GI bleed?   *    switched Protonix 40 mg IV bid to 40 mg p.o. daily   Azucena Freed  06/01/2020, 8:17 AM Phone [phone]   Attending physician's note   I have taken an interval history, reviewed the chart and examined the patient. I agree with the Advanced Practitioner's note, impression and recommendations.   87yr old HOH  With LGI bleed likely diverticular (resolved). Hb stable 11. Last colon 2011 with severe diverticular Dz. She is not keen on getting endo procedures done. I agree d/t advanced age.  Chronic RUQ pain/discomfort. S/P ERCP with ES followed by lap chole 2021. Nl LFTs.  Associated Afib on Xeralto (last dose 3/28), CKD3, CHF, GERD, SSS s/p pacemaker.  Unexplained wt loss (37lb over 1 year)  Plan: -Recommend to managed conservatively.  Hold off on colon unless active bleeding. -Trend CBC -CT AP to assess for weight loss with p.o. contrast only. -If no bleeding over next 24 hours, can resume Xarelto. -Would continue Protonix for now. -D/W Mickel Baas (pt's daughter)    Carmell Austria, MD [facility] [phone]

## 2020-05-31 NOTE — Telephone Encounter (Signed)
Pt talked to team health 05/31/2020 at 9:22 am. Caller states she has been having blood in her stool since last Wednesday. States her stools are dark red and toilet water turns red. States she is having pain under her left rib and radiates to shoulder blade. Advised to go to ED. Patient refused. Team health contacted office where they also stated patients gums and nose were bleeding as well. RN and CMA of Dr Quay Burow notified and pt was called back.

## 2020-05-31 NOTE — Telephone Encounter (Signed)
Patient calling, states she thinks she is having internal bleeding because she has been having some deep red stools when she uses the restroom. Transferred to team health for further evaluation.

## 2020-05-31 NOTE — H&P (Signed)
History and Physical    Beverly Kim YDX:[id number] DOB: 02-20-1925 DOA: 05/31/2020  PCP: [name], MD  Patient coming from: Home.   I have personally briefly reviewed patient's old medical records in [place]  Chief Complaint: rectal bleeding from from 5 days.   HPI: Beverly Kim is a 87 y.o. female with medical history significant of GERD, hypertension, hyperlipidemia, paroxysmal atrial fibrillation on Xarelto, history of car complete heart block s/p pacemaker, chronic diastolic heart failure with preserved EF, presents to ED with intermittent rectal bleeding and black stools since Wednesday.  Patient reports she has some cramping pain, no nausea vomiting. Patient denies any fevers chest pain shortness of breath, syncopal episodes, dysuria, hemoptysis or hematemesis.  She reports she uses ibuprofen and Tylenol occasionally.  Her last colonoscopy/EGD was more than 10 years ago. ED Course: On arrival to ED she was afebrile, tachypneic with a RR of 22/min, normotensive.  Labs were significant for hemoglobin of 11.4, hematocrit of 34.4.1, INR of 1.7.  Stool for occult blood was positive. He showed atrial fibrillation.  Patient's baseline hemoglobin is not far from today's hemoglobin. GI consulted and she will be seen tomorrow She will be n.p.o. after midnight. She was referred to [facility] for admission for evaluation of rectal bleed  Review of Systems: As per HPI otherwise"All others reviewed and are negative,"  Past Medical History:  Diagnosis Date  . Cardiac pacemaker 05/2016   Sick sinus syndrome/complete heart block  . Chronic heart failure with preserved ejection fraction (HFpEF) ([place])   . Colon polyps   . Diverticulosis   . Dyspnea   . GERD (gastroesophageal reflux disease)   . Hearing loss of both ears   . Hepatic cyst   . Hiatal hernia   . Hyperlipidemia   . Hypertension   . Hypothyroidism   . Low back pain   . Non-occlusive coronary artery disease 04/2001    Cardiac Cath 04/18/2001: 50 and 70% mid LAD after D1. = Nonischemic by Myoview.  Medical management.  . Other left bundle branch block   . PAF (paroxysmal atrial fibrillation) ([place]) 08/16/2016   observed on PPM interrogation, asymptomatic, chad2vasc score is 5.  Bisoprolol for rate control, Xarelto for anticoagulation.  Marland Kitchen URI (upper respiratory infection)     Past Surgical History:  Procedure Laterality Date  . CARDIOVERSION N/A 01/16/2019   Procedure: CARDIOVERSION;  Surgeon: Josue Hector, MD;  Location: [facility] ENDOSCOPY;  Service: Cardiovascular;  Laterality: N/A;  . CHOLECYSTECTOMY N/A 12/15/2019   Procedure: LAPAROSCOPIC CHOLECYSTECTOMY;  Surgeon: Stark Klein, MD;  Location: [place];  Service: General;  Laterality: N/A;  . COLONOSCOPY    . ERCP N/A 07/21/2019   Procedure: ENDOSCOPIC RETROGRADE CHOLANGIOPANCREATOGRAPHY (ERCP);  Surgeon: Milus Banister, MD;  Location: [facility] ENDOSCOPY;  Service: Endoscopy;  Laterality: N/A;  . HERNIA REPAIR    . LEFT HEART CATH AND CORONARY ANGIOGRAPHY  04/18/2001   Dr. Lia Foyer: EF 60 to 65%.  2+ MR.  Upper LM takeoff with 90 degree bend just inside the ostium.  50-70% mid LAD after D1.  D1 is 30%.    Distal LAD wraps the apex and is free of disease.  LCx is mostly large OM branch.  RCA provides PDA and PL branches and Free of disease. -->  Presumably evaluated by Myoview that was nonischemic, therefore no PCI performed.  Marland Kitchen NM MYOVIEW LTD  01/2012   Normal EF.  Normal study.  No ischemia or infarction.  Marland Kitchen PACEMAKER IMPLANT  Left 05/15/2016   SJM Assirity MRI dual chamber PPM implanted by Dr Rayann Heman for CHB  . REMOVAL OF STONES  07/21/2019   Procedure: REMOVAL OF STONES;  Surgeon: Milus Banister, MD;  Location: [facility] ENDOSCOPY;  Service: Endoscopy;;  . [name]  07/21/2019   Procedure: [name];  Surgeon: Milus Banister, MD;  Location: [facility] ENDOSCOPY;  Service: Endoscopy;;  . THYROIDECTOMY    . TONSILLECTOMY AND ADENOIDECTOMY    . TOTAL ABDOMINAL  HYSTERECTOMY W/ BILATERAL SALPINGOOPHORECTOMY    . TRANSTHORACIC ECHOCARDIOGRAM  06/27/2016   Moderate basal-septal LVH, EF 60 to 65%.  No R WMA.  GR 1 DD.  Mild aortic valve calcification.  No AS.  Trivial MR.--Indicated improved EF compared to prior study in 2015.  Marland Kitchen TRANSTHORACIC ECHOCARDIOGRAM  12/'13; 11/'15   a) EF 50 and 55%.  Normal function.-Moderate MR.  Mild LA dilation.; b) Echo for shortness of breath => reduced EF of 45 to 50%.  Mild LVH.  Inferior reversible HK.  GRII DD.  Moderate MR.  . TRANSTHORACIC ECHOCARDIOGRAM  10/2019   EF 60 -65%.  No RWMA.  Severe concentric LVH.  Unable to assess diastolic function.  Mild biatrial dilation.  Moderate MR moderate TR, no AS    Social History  reports that she has never smoked. She has never used smokeless tobacco. She reports that she does not drink alcohol and does not use drugs.  Allergies  Allergen Reactions  . Adhesive [Tape] Itching, Dermatitis, Rash and Other (See Comments)    Blisters and "skin bubbles"  . Ace Inhibitors Cough  . Atorvastatin Other (See Comments)    REACTION: Reaction not known  . Clindamycin Other (See Comments)    Unknown  . Codeine Other (See Comments)    hallucinations   . Latex Other (See Comments)    blisters  . Shellfish Allergy Other (See Comments)    "gallbladder attack"  . Simvastatin Other (See Comments)    fatigue  . Penicillins Rash    Has patient had a PCN reaction causing immediate rash, facial/tongue/throat swelling, SOB or lightheadedness with hypotension: Unknown Has patient had a PCN reaction causing severe rash involving mucus membranes or skin necrosis: Unknown Has patient had a PCN reaction that required hospitalization: Unknown Has patient had a PCN reaction occurring within the last 10 years: No If all of the above answers are "NO", then may proceed with Cephalosporin use.     Family History  Problem Relation Age of Onset  . Coronary artery disease Other        family hx  of 1st degree relative <50  . Diabetes Other        family hx of  . Other Other        cardiovascular disorder family hx of  . Other Other        neurological disorder family hx of  . Other Other        respiratory disease family hx of  . Heart attack Daughter   . Heart Problems Other        all children  . Sudden death Son   . Heart disease Brother   . Heart disease Sister   . Colon cancer Neg Hx   . Colon polyps Neg Hx    Family history pertinent and reviewed.   Prior to Admission medications   Medication Sig Start Date End Date Taking? Authorizing Provider  acetaminophen (TYLENOL) 650 MG CR tablet Take 650 mg by mouth every 8 (eight)  hours as needed for pain.   Yes [provider]  albuterol (PROVENTIL HFA;VENTOLIN HFA) 108 (90 Base) MCG/ACT inhaler Inhale 1-2 puffs into the lungs every 6 (six) hours as needed for wheezing or shortness of breath. 03/05/17  Yes Sherwood Gambler, MD  bisoprolol (ZEBETA) 5 MG tablet Take 0.5 tablets (2.5 mg total) by mouth 2 (two) times daily. 04/08/20  Yes Leonie Man, MD  Fluticasone-Umeclidin-Vilant (TRELEGY ELLIPTA) 100-62.5-25 MCG/INH AEPB Inhale 1 puff into the lungs daily. 02/05/19  Yes Mannam, Praveen, MD  furosemide (LASIX) 40 MG tablet Take 1 tablet by mouth once daily Patient taking differently: Take 40 mg by mouth daily. 12/03/19  Yes Leonie Man, MD  ibuprofen (ADVIL) 200 MG tablet Take 200 mg by mouth every 6 (six) hours as needed for moderate pain.   Yes [provider]  Melatonin 5 MG CAPS Take 5 mg by mouth at bedtime as needed (sleep).   Yes [provider]  metolazone (ZAROXOLYN) 2.5 MG tablet TAKE ONE TABLET BY MOUTH THREE TIMES A WEEK Patient taking differently: Take 2.5 mg by mouth See admin instructions. Take 2.5 mg 3 times weekly when needed for swelling 06/12/19  Yes Fenton, Clint R, PA  potassium chloride (KLOR-CON) 10 MEQ tablet Take 1 tablet by mouth on days metolazone are taken Patient taking  differently: Take 10 mEq by mouth daily as needed (when taking metolazone). 06/19/19  Yes Fenton, Clint R, PA  SYNTHROID 88 MCG tablet TAKE 1 TABLET BY MOUTH ONCE DAILY BEFORE BREAKFAST Patient taking differently: Take 88 mcg by mouth daily before breakfast. 12/03/19  Yes Burns, Claudina Lick, MD  XARELTO 15 MG TABS tablet TAKE 1 TABLET BY MOUTH ONCE DAILY WITH SUPPER Patient taking differently: Take 1 mg by mouth daily with supper. 12/23/19  Yes FentonBerton Mount R, [place]    Physical Exam: Vitals:   05/31/20 1327 05/31/20 1512 05/31/20 1622 05/31/20 1630  BP: 138/72 117/72 127/75 127/65  Pulse: 68 69 70 70  Resp: 18 18 15  (!) 22  Temp: (!) 97.4 F (36.3 C) (!) 97.5 F (36.4 C)    TempSrc:  Oral    SpO2: 100% 97% 100% 100%    Constitutional: NAD, calm, comfortable Vitals:   05/31/20 1327 05/31/20 1512 05/31/20 1622 05/31/20 1630  BP: 138/72 117/72 127/75 127/65  Pulse: 68 69 70 70  Resp: 18 18 15  (!) 22  Temp: (!) 97.4 F (36.3 C) (!) 97.5 F (36.4 C)    TempSrc:  Oral    SpO2: 100% 97% 100% 100%   Eyes: PERRL, lids and conjunctivae normal ENMT: Mucous membranes are moist. Posterior pharynx clear of any exudate or lesions.Normal dentition.  Neck: normal, supple, no masses, no thyromegaly Respiratory: clear to auscultation bilaterally, no wheezing, no crackles. Normal respiratory effort. No accessory muscle use.  Cardiovascular: Regular rate and rhythm, no murmurs / rubs / gallops. No extremity edema. 2+ pedal pulses. No carotid bruits.  Abdomen: no tenderness, no masses palpated. No hepatosplenomegaly. Bowel sounds positive.  Musculoskeletal: no clubbing / cyanosis. No joint deformity upper and lower extremities. Good ROM, no contractures. Normal muscle tone.  Skin: no rashes, lesions, ulcers. No induration Neurologic: CN 2-12 grossly intact. Sensation intact, DTR normal. Strength 5/5 in all 4.  Psychiatric: Normal judgment and insight. Alert and oriented x 3. Normal mood.    Labs on  Admission: I have personally reviewed following labs and imaging studies  CBC: Recent Labs  Lab 05/31/20 1339  WBC 6.3  HGB  11.4*  HCT 34.4*  MCV 98.6  PLT 485    Basic Metabolic Panel: Recent Labs  Lab 05/31/20 1339  NA 137  K 3.9  CL 103  CO2 24  GLUCOSE 118*  BUN 30*  CREATININE 1.15*  CALCIUM 9.6    GFR: CrCl cannot be calculated (Unknown ideal weight.).  Liver Function Tests: Recent Labs  Lab 05/31/20 1339  AST 25  ALT 19  ALKPHOS 77  BILITOT 1.3*  PROT 7.0  ALBUMIN 4.0    Urine analysis:    Component Value Date/Time   COLORURINE YELLOW 07/19/2019 0139   APPEARANCEUR HAZY (A) 07/19/2019 0139   LABSPEC 1.018 07/19/2019 0139   PHURINE 6.0 07/19/2019 0139   GLUCOSEU NEGATIVE 07/19/2019 0139   HGBUR NEGATIVE 07/19/2019 0139   HGBUR 2+ 12/13/2008 1032   BILIRUBINUR NEGATIVE 07/19/2019 0139   BILIRUBINUR n 12/25/2010 1311   KETONESUR NEGATIVE 07/19/2019 0139   PROTEINUR 100 (A) 07/19/2019 0139   UROBILINOGEN 0.2 12/25/2010 1311   UROBILINOGEN 0.2 12/13/2008 1032   NITRITE NEGATIVE 07/19/2019 0139   LEUKOCYTESUR MODERATE (A) 07/19/2019 0139    Radiological Exams on Admission: No results found.  EKG: Independently reviewed. Atrial fibrillation.   Assessment/Plan Active Problems:   GI bleed   Rectal bleeding/melena/GI bleed Patient reports intermittent stools mixed with bright red blood and dark stools since Wednesday. Hemoglobin stable around baseline at 11 Clear liquid diet for tonight and n.p.o. after midnight Start the patient on IV Protonix 40 mg twice daily GI consulted H&H every 12 hours and transfuse to keep hemoglobin greater than 7.   Paroxysmal atrial fibrillation Rate controlled on on Xarelto for anticoagulation Xarelto held for GI bleed.    Essential hypertension Blood pressure parameters appear to be optimal.    Chronic diastolic heart failure She appears to be compensated.  Hold Lasix for  now.   Hypothyroidism Continue with Synthroid.    DVT prophylaxis:scd's Code Status:  Full code.  Family Communication:  Family  Disposition Plan:   Patient is from:  Home  Anticipated DC to:  Home  anticipated DC date:  Wednesday  Anticipated DC barriers: Evaluation of GI bleed Consults called:  GI consult  admission status observation. MedSurg  Severity of Illness: The appropriate patient status for this patient is OBSERVATION. Observation status is judged to be reasonable and necessary in order to provide the required intensity of service to ensure the patient's safety. The patient's presenting symptoms, physical exam findings, and initial radiographic and laboratory data in the context of their medical condition is felt to place them at decreased risk for further clinical deterioration. Furthermore, it is anticipated that the patient will be medically stable for discharge from the hospital within 2 midnights of admission.      Hosie Poisson MD [facility]  How to contact the [facility] Attending or Consulting provider [place] or covering provider during after hours [place], for this patient?   1. Check the care team in [facility] and look for a) attending/consulting TR[facility] provider listed and b) the [facility] team listed 2. Log into [email or web address] and use [place]'s universal password to access. If you do not have the password, please contact the hospital operator. 3. Locate the [facility] provider you are looking for under [facility] and page to a number that you can be directly reached. 4. If you still have difficulty reaching the provider, please page the [facility] (Director on Call) for the Hospitalists listed on amion for assistance.  05/31/2020, 6:05  PM    

## 2020-05-31 NOTE — ED Provider Notes (Signed)
[facility] EMERGENCY DEPARTMENT Provider Note   CSN: [id number] Arrival date & time: 05/31/20  1320     History No chief complaint on file.   Beverly Kim is a 87 y.o. female.  HPI   This patient is a 87 year old female, she has known congestive heart failure as well as paroxysmal atrial fibrillation and takes Xarelto.  She reports that she had a colonoscopy many years ago but does not remember who did it or where it was done or if there was any problems.  What she does knows that she does not usually have gastrointestinal bleeding but over the last 7 days she has had dark red bowel movements, she states that the entire stool is dark red, it fills the toilet bowl with red, she has had a little bit of abdominal cramping which comes and goes with the bowel movements but has not had any severe abdominal pain.  No nausea or vomiting, she does take Xarelto but denies any other anticoagulants anti-inflammatories or alcohol use.  She was encouraged by her son to come to the hospital today because of the ongoing blood in her stools.  When she called the doctor's office they told her to come to the ER instead of going to the office  Past Medical History:  Diagnosis Date  . Cardiac pacemaker 05/2016   Sick sinus syndrome/complete heart block  . Chronic heart failure with preserved ejection fraction (HFpEF) ([place])   . Colon polyps   . Diverticulosis   . Dyspnea   . GERD (gastroesophageal reflux disease)   . Hearing loss of both ears   . Hepatic cyst   . Hiatal hernia   . Hyperlipidemia   . Hypertension   . Hypothyroidism   . Low back pain   . Non-occlusive coronary artery disease 04/2001   Cardiac Cath 04/18/2001: 50 and 70% mid LAD after D1. = Nonischemic by Myoview.  Medical management.  . Other left bundle branch block   . PAF (paroxysmal atrial fibrillation) ([place]) 08/16/2016   observed on PPM interrogation, asymptomatic, chad2vasc score is 5.  Bisoprolol for rate  control, Xarelto for anticoagulation.  Marland Kitchen URI (upper respiratory infection)     Patient Active Problem List   Diagnosis Date Noted  . Biliary colic 02/77/4128  . Chronic calculous cholecystitis 12/15/2019  . Cardiac pacemaker 09/17/2019  . Preop cardiovascular exam 09/17/2019  . Vertigo 09/12/2019  . Unsteady gait when walking 09/12/2019  . Dyshidrotic eczema 09/12/2019  . Nausea 08/05/2019  . Black stool 07/31/2019  . Fluid overload 07/31/2019  . Abdominal pain   . Acute cholecystitis 07/19/2019  . Persistent atrial fibrillation CHA2DS2-VASc score =5; Xarelto 15 mg 01/23/2019  . Secondary hypercoagulable state ([place]) 01/23/2019  . Fatigue 11/25/2018  . Poor balance 10/22/2018  . Calculus of gallbladder without cholecystitis without obstruction 07/26/2017  . Pyelonephritis 07/25/2017  . Carotid disease, bilateral ([place]) 04/08/2017  . Leg pain 04/08/2017  . Non-rheumatic mitral regurgitation 07/12/2016  . Complete heart block ([place]) 05/14/2016  . Diabetes mellitus without complication ([place]) 78/67/6720  . Degenerative disc disease, cervical 08/23/2015  . Degenerative disc disease, lumbar 08/23/2015  . Left rotator cuff tear arthropathy 07/12/2015  . Left shoulder pain 04/06/2015  . Frozen shoulder 01/03/2015  . Cough variant asthma 12/08/2013  . Upper airway cough syndrome 10/27/2013  . Dyspnea 02/03/2013  . GERD 07/06/2009  . HIATAL HERNIA 07/06/2009  . DIVERTICULOSIS, COLON 07/06/2009  . HEPATIC CYST 07/06/2009  . COLONIC POLYPS, HX OF  07/06/2009  . MIXED HEARING LOSS BILATERAL 12/13/2008  . Essential hypertension 06/19/2008  . MITRAL VALVE PROLAPSE 06/19/2008  . LBBB (left bundle branch block) 06/19/2008  . Hyperlipidemia 08/12/2007  . Hypothyroidism 12/02/2006  . CAD (coronary artery disease) 12/02/2006  . Chronic diastolic heart failure ([place]) 12/02/2006    Past Surgical History:  Procedure Laterality Date  . CARDIOVERSION N/A 01/16/2019   Procedure:  CARDIOVERSION;  Surgeon: Josue Hector, MD;  Location: [facility] ENDOSCOPY;  Service: Cardiovascular;  Laterality: N/A;  . CHOLECYSTECTOMY N/A 12/15/2019   Procedure: LAPAROSCOPIC CHOLECYSTECTOMY;  Surgeon: Stark Klein, MD;  Location: [facility];  Service: General;  Laterality: N/A;  . COLONOSCOPY    . ERCP N/A 07/21/2019   Procedure: ENDOSCOPIC RETROGRADE CHOLANGIOPANCREATOGRAPHY (ERCP);  Surgeon: Milus Banister, MD;  Location: [facility] ENDOSCOPY;  Service: Endoscopy;  Laterality: N/A;  . HERNIA REPAIR    . LEFT HEART CATH AND CORONARY ANGIOGRAPHY  04/18/2001   Dr. Lia Foyer: EF 60 to 65%.  2+ MR.  Upper LM takeoff with 90 degree bend just inside the ostium.  50-70% mid LAD after D1.  D1 is 30%.    Distal LAD wraps the apex and is free of disease.  LCx is mostly large OM branch.  RCA provides PDA and PL branches and Free of disease. -->  Presumably evaluated by Myoview that was nonischemic, therefore no PCI performed.  Marland Kitchen NM MYOVIEW LTD  01/2012   Normal EF.  Normal study.  No ischemia or infarction.  Marland Kitchen PACEMAKER IMPLANT Left 05/15/2016   SJM Assirity MRI dual chamber PPM implanted by Dr Rayann Heman for CHB  . REMOVAL OF STONES  07/21/2019   Procedure: REMOVAL OF STONES;  Surgeon: Milus Banister, MD;  Location: [facility] ENDOSCOPY;  Service: Endoscopy;;  . [name]  07/21/2019   Procedure: [name];  Surgeon: Milus Banister, MD;  Location: [facility] ENDOSCOPY;  Service: Endoscopy;;  . THYROIDECTOMY    . TONSILLECTOMY AND ADENOIDECTOMY    . TOTAL ABDOMINAL HYSTERECTOMY W/ BILATERAL SALPINGOOPHORECTOMY    . TRANSTHORACIC ECHOCARDIOGRAM  06/27/2016   Moderate basal-septal LVH, EF 60 to 65%.  No R WMA.  GR 1 DD.  Mild aortic valve calcification.  No AS.  Trivial MR.--Indicated improved EF compared to prior study in 2015.  Marland Kitchen TRANSTHORACIC ECHOCARDIOGRAM  12/'13; 11/'15   a) EF 50 and 55%.  Normal function.-Moderate MR.  Mild LA dilation.; b) Echo for shortness of breath => reduced EF of 45 to 50%.  Mild LVH.  Inferior  reversible HK.  GRII DD.  Moderate MR.  . TRANSTHORACIC ECHOCARDIOGRAM  10/2019   EF 60 -65%.  No RWMA.  Severe concentric LVH.  Unable to assess diastolic function.  Mild biatrial dilation.  Moderate MR moderate TR, no AS     OB History   No obstetric history on file.     Family History  Problem Relation Age of Onset  . Coronary artery disease Other        family hx of 1st degree relative <50  . Diabetes Other        family hx of  . Other Other        cardiovascular disorder family hx of  . Other Other        neurological disorder family hx of  . Other Other        respiratory disease family hx of  . Heart attack Daughter   . Heart Problems Other        all children  .  Sudden death Son   . Heart disease Brother   . Heart disease Sister   . Colon cancer Neg Hx   . Colon polyps Neg Hx     Social History   Tobacco Use  . Smoking status: Never Smoker  . Smokeless tobacco: Never Used  Vaping Use  . Vaping Use: Never used  Substance Use Topics  . Alcohol use: No  . Drug use: No    Home Medications Prior to Admission medications   Medication Sig Start Date End Date Taking? Authorizing Provider  acetaminophen (TYLENOL) 650 MG CR tablet Take 650 mg by mouth every 8 (eight) hours as needed for pain.    [provider]  albuterol (PROVENTIL HFA;VENTOLIN HFA) 108 (90 Base) MCG/ACT inhaler Inhale 1-2 puffs into the lungs every 6 (six) hours as needed for wheezing or shortness of breath. 03/05/17   Sherwood Gambler, MD  bisoprolol (ZEBETA) 5 MG tablet Take 0.5 tablets (2.5 mg total) by mouth 2 (two) times daily. 04/08/20   Leonie Man, MD  calcium carbonate (TUMS - DOSED IN MG ELEMENTAL CALCIUM) 500 MG chewable tablet Chew 1 tablet by mouth daily as needed for indigestion or heartburn.    [provider]  clotrimazole (CLOTRIMAZOLE ANTI-FUNGAL) 1 % cream Apply 1 application topically 2 (two) times daily. 12/09/19   [name], MD  Fluocinolone Acetonide  0.01 % OIL Place 3-4 drops into both ears daily as needed for itching. 11/13/19   [provider]  Fluticasone-Umeclidin-Vilant (TRELEGY ELLIPTA) 100-62.5-25 MCG/INH AEPB Inhale 1 puff into the lungs daily. 02/05/19   Marshell Garfinkel, MD  furosemide (LASIX) 40 MG tablet Take 1 tablet by mouth once daily 12/03/19   Leonie Man, MD  ibuprofen (ADVIL) 200 MG tablet Take 200 mg by mouth every 6 (six) hours as needed for moderate pain.    [provider]  Melatonin 5 MG CAPS Take 5 mg by mouth at bedtime as needed (sleep).    [provider]  metolazone (ZAROXOLYN) 2.5 MG tablet TAKE ONE TABLET BY MOUTH THREE TIMES A WEEK Patient taking differently: Take 2.5 mg by mouth See admin instructions. Take 2.5 mg 3 times weekly when needed for swelling 06/12/19   Fenton, Clint R, PA  potassium chloride (KLOR-CON) 10 MEQ tablet Take 1 tablet by mouth on days metolazone are taken Patient taking differently: Take 10 mEq by mouth daily as needed (when taking metolazone). 06/19/19   Fenton, Clint R, PA  SYNTHROID 88 MCG tablet TAKE 1 TABLET BY MOUTH ONCE DAILY BEFORE BREAKFAST 12/03/19   Burns, Claudina Lick, MD  traMADol (ULTRAM) 50 MG tablet Take 1 tablet (50 mg total) by mouth every 6 (six) hours as needed for moderate pain. 12/17/19   Stark Klein, MD  triamcinolone cream (KENALOG) 0.1 % Apply 1 application topically 2 (two) times daily as needed. Patient taking differently: Apply 1 application topically 2 (two) times daily as needed (contact dermatitis). 09/11/19   [name], MD  vitamin C (ASCORBIC ACID) 500 MG tablet Take 500 mg by mouth daily.    [provider]  XARELTO 15 MG TABS tablet TAKE 1 TABLET BY MOUTH ONCE DAILY WITH SUPPER 12/23/19   Fenton, Clint R, PA    Allergies    Adhesive [tape], Ace inhibitors, Atorvastatin, Clindamycin, Codeine, Latex, Shellfish allergy, Simvastatin, and Penicillins  Review of Systems   Review of Systems  All other systems reviewed and  are negative.   Physical Exam Updated Vital Signs  BP 127/65   Pulse 70   Temp (!) 97.5 F (36.4 C) (Oral)   Resp (!) 22   SpO2 100%   Physical Exam Vitals and nursing note reviewed.  Constitutional:      General: She is not in acute distress.    Appearance: She is well-developed.  HENT:     Head: Normocephalic and atraumatic.     Mouth/Throat:     Pharynx: No oropharyngeal exudate.  Eyes:     General: No scleral icterus.       Right eye: No discharge.        Left eye: No discharge.     Conjunctiva/sclera: Conjunctivae normal.     Pupils: Pupils are equal, round, and reactive to light.  Neck:     Thyroid: No thyromegaly.     Vascular: No JVD.  Cardiovascular:     Rate and Rhythm: Normal rate and regular rhythm.     Heart sounds: Murmur heard.  No friction rub. No gallop.      Comments: Soft murmur Pulmonary:     Effort: Pulmonary effort is normal. No respiratory distress.     Breath sounds: Normal breath sounds. No wheezing or rales.  Abdominal:     General: Bowel sounds are normal. There is no distension.     Palpations: Abdomen is soft. There is no mass.     Tenderness: There is abdominal tenderness.     Comments: Minimal periumbilical tenderness, no masses  Genitourinary:    Comments: Rectal exam performed with nurse tech chaperone at the bedside, the patient has a normal-appearing external anal sphincter, rectum without any masses or tenderness or fissures or hemorrhoids.  There is dark red and bright red stool found in the rectal vault immediately Hemoccult positive Musculoskeletal:        General: No tenderness. Normal range of motion.     Cervical back: Normal range of motion and neck supple.  Lymphadenopathy:     Cervical: No cervical adenopathy.  Skin:    General: Skin is warm and dry.     Findings: No erythema or rash.  Neurological:     General: No focal deficit present.     Mental Status: She is alert.     Coordination: Coordination normal.      Comments: Other than being hard of hearing the patient has normal strength in all 4 extremity  Psychiatric:        Behavior: Behavior normal.     ED Results / Procedures / Treatments   Labs (all labs ordered are listed, but only abnormal results are displayed) Labs Reviewed  COMPREHENSIVE METABOLIC PANEL - Abnormal; Notable for the following components:      Result Value   Glucose, Bld 118 (*)    BUN 30 (*)    Creatinine, Ser 1.15 (*)    Total Bilirubin 1.3 (*)    GFR, Estimated 44 (*)    All other components within normal limits  CBC - Abnormal; Notable for the following components:   RBC 3.49 (*)    Hemoglobin 11.4 (*)    HCT 34.4 (*)    All other components within normal limits  PROTIME-INR - Abnormal; Notable for the following components:   Prothrombin Time 19.2 (*)    INR 1.7 (*)    All other components within normal limits  POC OCCULT BLOOD, ED - Abnormal; Notable for the following components:   Fecal Occult Bld POSITIVE (*)    All other components within normal limits  TYPE AND SCREEN  ABO/RH    EKG EKG Interpretation  Date/Time:  Tuesday May 31 2020 16:31:50 EDT Ventricular Rate:  70 PR Interval:    QRS Duration: 164 QT Interval:  492 QTC Calculation: 531 R Axis:   -85 Text Interpretation: VENTRICULAR PACED RHYTHM no other significant changes Confirmed by Noemi Chapel [phone]) on 05/31/2020 4:37:44 PM   Radiology No results found.  Procedures Procedures   Medications Ordered in ED Medications - No data to display  ED Course  I have reviewed the triage vital signs and the nursing notes.  Pertinent labs & imaging results that were available during my care of the patient were reviewed by me and considered in my medical decision making (see chart for details).    MDM Rules/Calculators/A&P                          Thankfully the patient is not severely anemic, her laboratory work-up shows that her hemoglobin is 11.4, creatinine is 1.1 and BUN is  30.  Hemoccult is positive  The patient will likely need to be admitted and hold on her Xarelto  Discussed the case with Azucena Freed of the gastroenterology service will see the patient consultation  Will admit to hospital  Final Clinical Impression(s) / ED Diagnoses Final diagnoses:  Gastrointestinal hemorrhage, unspecified gastrointestinal hemorrhage type    Rx / DC Orders ED Discharge Orders    None       Noemi Chapel, MD 06/04/20 [id number]

## 2020-05-31 NOTE — Telephone Encounter (Signed)
Pt notified of Dr Quay Burow request that pt go to emergency room.  Pt states that she will ask her son to take her after he gets off work at 2:30.  Offered to call paramedics & pt declined.  Offered to call the patient's son & pt declines; states it works better for her to call her son herself.

## 2020-05-31 NOTE — Progress Notes (Signed)
Notified of patient's situation by Dr. Sabra Heck.  Having rectal bleeding, not melena and some reports of abdominal pain.  This started last Wednesday. Hgb 11.  Blood pressure and pulse stable. Takes Xarelto for A. Fib..  Plan to see patient and formal consult tomorrow morning. Could also have clear liquids.  Would hold Xarelto which I think is already being done and consider CTAP, probably without contrast as she has kidney disease, to ascertain if she has colitis as clinical picture could represent ischemic colitis. GI available tonight if needed, contact on-call GI.  Azucena Freed, PA-C

## 2020-05-31 NOTE — ED Notes (Signed)
RN attempted x1

## 2020-05-31 NOTE — ED Triage Notes (Signed)
Patient complains of right sided abdominal pain x week. Reports passing dark bloody stool since that time. Patient alert and oriented, states that she doesn't feel well. Alert and oriented

## 2020-06-01 ENCOUNTER — Observation Stay (HOSPITAL_COMMUNITY): Payer: PPO

## 2020-06-01 ENCOUNTER — Encounter (HOSPITAL_COMMUNITY): Payer: Self-pay | Admitting: Internal Medicine

## 2020-06-01 DIAGNOSIS — I4819 Other persistent atrial fibrillation: Secondary | ICD-10-CM

## 2020-06-01 DIAGNOSIS — K219 Gastro-esophageal reflux disease without esophagitis: Secondary | ICD-10-CM

## 2020-06-01 DIAGNOSIS — K760 Fatty (change of) liver, not elsewhere classified: Secondary | ICD-10-CM | POA: Diagnosis not present

## 2020-06-01 DIAGNOSIS — I5032 Chronic diastolic (congestive) heart failure: Secondary | ICD-10-CM | POA: Diagnosis not present

## 2020-06-01 DIAGNOSIS — K921 Melena: Secondary | ICD-10-CM

## 2020-06-01 DIAGNOSIS — R0781 Pleurodynia: Secondary | ICD-10-CM | POA: Diagnosis not present

## 2020-06-01 DIAGNOSIS — R109 Unspecified abdominal pain: Secondary | ICD-10-CM | POA: Diagnosis not present

## 2020-06-01 DIAGNOSIS — K922 Gastrointestinal hemorrhage, unspecified: Secondary | ICD-10-CM | POA: Diagnosis not present

## 2020-06-01 LAB — HEMOGLOBIN AND HEMATOCRIT, BLOOD
HCT: 33.7 % — ABNORMAL LOW (ref 36.0–46.0)
HCT: 31.8 % — ABNORMAL LOW (ref 36.0–46.0)
Hemoglobin: 11.1 g/dL — ABNORMAL LOW (ref 12.0–15.0)
Hemoglobin: 10.3 g/dL — ABNORMAL LOW (ref 12.0–15.0)

## 2020-06-01 LAB — BASIC METABOLIC PANEL
Anion gap: 11 (ref 5–15)
BUN: 26 mg/dL — ABNORMAL HIGH (ref 8–23)
CO2: 25 mmol/L (ref 22–32)
Calcium: 9.5 mg/dL (ref 8.9–10.3)
Chloride: 103 mmol/L (ref 98–111)
Creatinine, Ser: 1.14 mg/dL — ABNORMAL HIGH (ref 0.44–1.00)
GFR, Estimated: 45 mL/min — ABNORMAL LOW (ref >60)
Glucose, Bld: 110 mg/dL — ABNORMAL HIGH (ref 70–99)
Potassium: 3.8 mmol/L (ref 3.5–5.1)
Sodium: 139 mmol/L (ref 135–145)

## 2020-06-01 LAB — ABO/RH: ABO/RH(D): O POS

## 2020-06-01 MED ORDER — PANTOPRAZOLE SODIUM 40 MG PO TBEC
40.0000 mg | DELAYED_RELEASE_TABLET | Freq: Every day | ORAL | Status: DC
  Administered 2020-06-02: 40 mg via ORAL
  Filled 2020-06-01: qty 1

## 2020-06-01 MED ORDER — IOHEXOL 300 MG/ML  SOLN
75.0000 mL | Freq: Once | INTRAMUSCULAR | Status: AC | PRN
  Administered 2020-06-01: 75 mL via INTRAVENOUS

## 2020-06-01 NOTE — Progress Notes (Signed)
PROGRESS NOTE    Beverly Kim  JXB:[id number] DOB: 08-21-1924 DOA: 05/31/2020 PCP: [name], MD    No chief complaint on file.   Brief Narrative:   Beverly Kim is a 87 y.o. female with medical history significant of GERD, hypertension, hyperlipidemia, paroxysmal atrial fibrillation on Xarelto, history of complete heart block s/p pacemaker, chronic diastolic heart failure with preserved EF, presents to ED with intermittent rectal bleeding and black stools since Wednesday.  Patient reports she has some cramping pain, no nausea vomiting  Subjective:   She is very hard of hearing even with the hearing aids on , she is aaox3, good historian  Reports intermittent right upper quadrant pain Reports intermittent nose bleed and gum bleed from xarelto wonder xarelto dose could be reduced, she reports could not take eliquis due to weight loss while on it  Assessment & Plan:   Active Problems:   Hypothyroidism   Essential hypertension   Chronic diastolic heart failure (H[facility])   GERD   Persistent atrial fibrillation CHA2DS2-VASc score =5; Xarelto 15 mg   GI bleed  GI bleed Currently hemodynamically stable Hold Xarelto Monitor hemoglobin Currently on clears per GI recommendation Needs gi clearance for resuming xarelto, patient is wondering she could take lower dose ( she is on 15mg  daily prior to admission) GI consulted, will follow recommendation   History of diastolic CHF Currently on clears per GI recommendation., d/c ivf, continue hold Lasix and metolazone , likely able to resume tomorrow Close monitor volume status  Paroxysmal A. Fib On bisoprolol Xarelto held She follows with Dr Ellyn Hack  History of heart block status post pacemaker  CKD 3B Renal function appear close to baseline Renal dosing medication  Hypothyroidism Continue Synthroid      Unresulted Labs (From admission, onward)          Start     Ordered   06/01/20 1308  Basic metabolic panel   Tomorrow morning,   R        05/31/20 1811   06/01/20 0500  ABO/Rh  Once,   R        06/01/20 0500   05/31/20 2000  Hemoglobin and hematocrit, blood  Every 12 hours (non-specified),   R (with STAT, TIMED occurrences)     Comments: e    05/31/20 1811            DVT prophylaxis: SCDs Start: 05/31/20 1816   Code Status: Full Family Communication: Patient Disposition:   Status is: Observation   Dispo: The patient is from: Home              Anticipated d/c is to: Home              Anticipated d/c date is: 24 to 48 hours, if hemoglobin stable and cleared by GI                Consultants:   GI  Procedures:   None  Antimicrobials:   None     Objective: Vitals:   05/31/20 2130 05/31/20 2145 05/31/20 2327 06/01/20 0655  BP: 134/73 (!) 125/59 (!) 131/59 123/65  Pulse: 70 70 70 77  Resp: 17 18 18 16   Temp:   98.1 F (36.7 C) 98.1 F (36.7 C)  TempSrc:   Oral Oral  SpO2: 100% 100% 100% 98%   No intake or output data in the 24 hours ending 06/01/20 0759 There were no vitals filed for this visit.  Examination:  General exam:  calm, NAD, very hard of hearing Respiratory system: Clear to auscultation. Respiratory effort normal. Cardiovascular system: S1 & S2 heard, RRR.  No pedal edema. Gastrointestinal system: Abdomen is nondistended, soft and nontender. Normal bowel sounds heard. Central nervous system: Alert and oriented. No focal neurological deficits. Extremities: Symmetric 5 x 5 power. Skin: No rashes, lesions or ulcers Psychiatry: Judgement and insight appear normal. Mood & affect appropriate.     Data Reviewed: I have personally reviewed following labs and imaging studies  CBC: Recent Labs  Lab 05/31/20 1339 05/31/20 2112  WBC 6.3  --   HGB 11.4* 11.2*  HCT 34.4* 33.3*  MCV 98.6  --   PLT 152  --     Basic Metabolic Panel: Recent Labs  Lab 05/31/20 1339  NA 137  K 3.9  CL 103  CO2 24  GLUCOSE 118*  BUN 30*  CREATININE 1.15*   CALCIUM 9.6    GFR: CrCl cannot be calculated (Unknown ideal weight.).  Liver Function Tests: Recent Labs  Lab 05/31/20 1339  AST 25  ALT 19  ALKPHOS 77  BILITOT 1.3*  PROT 7.0  ALBUMIN 4.0    CBG: No results for input(s): GLUCAP in the last 168 hours.   Recent Results (from the past 240 hour(s))  SARS CORONAVIRUS 2 (TAT 6-24 HRS) Nasopharyngeal Nasopharyngeal Swab     Status: None   Collection Time: 05/31/20  5:52 PM   Specimen: Nasopharyngeal Swab  Result Value Ref Range Status   SARS Coronavirus 2 NEGATIVE NEGATIVE Final    Comment: (NOTE) SARS-CoV-2 target nucleic acids are NOT DETECTED.  The SARS-CoV-2 RNA is generally detectable in upper and lower respiratory specimens during the acute phase of infection. Negative results do not preclude SARS-CoV-2 infection, do not rule out co-infections with other pathogens, and should not be used as the sole basis for treatment or other patient management decisions. Negative results must be combined with clinical observations, patient history, and epidemiological information. The expected result is Negative.  Fact Sheet for Patients: SugarRoll.be  Fact Sheet for Healthcare Providers: https://www.woods-mathews.com/  This test is not yet approved or cleared by the [place] FDA and  has been authorized for detection and/or diagnosis of SARS-CoV-2 by FDA under an Emergency Use Authorization (EUA). This EUA will remain  in effect (meaning this test can be used) for the duration of the COVID-19 declaration under Se ction 564(b)(1) of the Act, 21 U.S.C. section 360bbb-3(b)(1), unless the authorization is terminated or revoked sooner.  Performed at [facility], [place] [address] [place], [place] [address]          Radiology Studies: No results found.      Scheduled Meds: . bisoprolol  2.5 mg Oral BID  . fluticasone furoate-vilanterol  1 puff Inhalation Daily   . levothyroxine  88 mcg Oral QAC breakfast  . pantoprazole (PROTONIX) IV  40 mg Intravenous Q12H  . umeclidinium bromide  1 puff Inhalation Daily   Continuous Infusions: . sodium chloride 75 mL/hr at 05/31/20 1758     LOS: 0 days   Time spent: 72mins Greater than 50% of this time was spent in counseling, explanation of diagnosis, planning of further management, and coordination of care.   Voice Recognition Viviann Spare dictation system was used to create this note, attempts have been made to correct errors. Please contact the author with questions and/or clarifications.   Florencia Reasons, MD PhD FACP [facility]  Available via Epic secure chat 7am-7pm for nonurgent issues Please  page for urgent issues To page the attending provider between 7A-7P or the covering provider during after hours 7P-7A, please log into the web site [email or web address] and access using universal [place] password for that web site. If you do not have the password, please call the hospital operator.    06/01/2020, 7:59 AM

## 2020-06-01 NOTE — Evaluation (Signed)
Physical Therapy Evaluation Patient Details Name: Beverly Kim MRN: [id number] DOB: 10-26-1924 Today's Date: 06/01/2020   History of Present Illness  87 y.o. female presents to [facility] ED with intermittent rectal bleeding and abdominal pain. Past medical history significant of GERD, hypertension, hyperlipidemia, paroxysmal atrial fibrillation on Xarelto, history of car complete heart block s/p pacemaker, chronic diastolic heart failure  Clinical Impression  Pt presents to PT with deficits in gait, balance, and endurance, but is likely not far from her baseline. Pt utilizes UE support to steady when ambulating this session and reports intermittent use of cane, walker, or hand hold when mobilizing at baseline. PT encourages use of cane for all out of bed upon initial return home as the pt does report some weakness compared to baseline. Pt will benefit from continued aggressive mobilization and acute PT POC to aide in a return to her prior level of function. PT recommends no PT needs at the time of discharge.    Follow Up Recommendations No PT follow up    Equipment Recommendations  None recommended by PT (PT recommends use of cane upon return home)    Recommendations for Other Services       Precautions / Restrictions Precautions Precautions: Fall Restrictions Weight Bearing Restrictions: No      Mobility  Bed Mobility               General bed mobility comments: not assessed    Transfers Overall transfer level: Independent Equipment used: None                Ambulation/Gait Ambulation/Gait assistance: Min Gaffer (Feet): 200 Feet (additional 15' without device) Assistive device: 1 person hand held assist;None Gait Pattern/deviations: Step-through pattern Gait velocity: reduced Gait velocity interpretation: <1.8 ft/sec, indicate of risk for recurrent falls General Gait Details: pt with slowed step-through gait, mild increase in lateral  sway  Stairs            Wheelchair Mobility    Modified Rankin (Stroke Patients Only)       Balance                                             Pertinent Vitals/Pain Pain Assessment: No/denies pain    Home Living Family/patient expects to be discharged to:: Private residence Living Arrangements: Alone Available Help at Discharge: Family;Available 24 hours/day Type of Home: House Home Access: Stairs to enter Entrance Stairs-Rails: Left Entrance Stairs-Number of Steps: 3 Home Layout: One level Home Equipment: Walker - 4 wheels;Cane - single point;Shower seat      Prior Function Level of Independence: Independent;Independent with assistive device(s)         Comments: pt reports intermittent use of cane vs 4 wheeled walker when she can remember     Hand Dominance   Dominant Hand: Right    Extremity/Trunk Assessment   Upper Extremity Assessment Upper Extremity Assessment: LUE deficits/detail LUE Deficits / Details: mild LUE shoulder AROM restrictions from prior injury    Lower Extremity Assessment Lower Extremity Assessment: Overall WFL for tasks assessed    Cervical / Trunk Assessment Cervical / Trunk Assessment: Kyphotic  Communication   Communication: HOH  Cognition Arousal/Alertness: Awake/alert Behavior During Therapy: WFL for tasks assessed/performed Overall Cognitive Status: Within Functional Limits for tasks assessed  General Comments General comments (skin integrity, edema, etc.): VSS on RA    Exercises     Assessment/Plan    PT Assessment Patient needs continued PT services  PT Problem List Decreased activity tolerance;Decreased balance;Decreased mobility;Decreased knowledge of use of DME;Decreased strength       PT Treatment Interventions DME instruction;Gait training;Stair training;Functional mobility training;Therapeutic activities;Balance  training;Patient/family education;Therapeutic exercise    PT Goals (Current goals can be found in the Care Plan section)  Acute Rehab PT Goals Patient Stated Goal: to go home PT Goal Formulation: With patient Time For Goal Achievement: 06/15/20 Potential to Achieve Goals: Good    Frequency Min 3X/week   Barriers to discharge        Co-evaluation               AM-PAC PT "6 Clicks" Mobility  Outcome Measure Help needed turning from your back to your side while in a flat bed without using bedrails?: None Help needed moving from lying on your back to sitting on the side of a flat bed without using bedrails?: None Help needed moving to and from a bed to a chair (including a wheelchair)?: None Help needed standing up from a chair using your arms (e.g., wheelchair or bedside chair)?: None Help needed to walk in hospital room?: A Little Help needed climbing 3-5 steps with a railing? : A Little 6 Click Score: 22    End of Session   Activity Tolerance: Patient tolerated treatment well Patient left: Other (comment) (standing in room, RN reports pt ok to ambulate independently in room) Nurse Communication: Mobility status PT Visit Diagnosis: Other abnormalities of gait and mobility (R26.89)    Time: 3546-5681 PT Time Calculation (min) (ACUTE ONLY): 13 min   Charges:   PT Evaluation $PT Eval Low Complexity: [place], PT, DPT Acute Rehabilitation Pager: [phone]   Zenaida Niece 06/01/2020, 5:35 PM

## 2020-06-01 NOTE — Plan of Care (Signed)
  Problem: Education: Goal: Knowledge of General Education information will improve Description: Including pain rating scale, medication(s)/side effects and non-pharmacologic comfort measures Outcome: Progressing   Problem: Clinical Measurements: Goal: Respiratory complications will improve Outcome: Progressing   Problem: Activity: Goal: Risk for activity intolerance will decrease Outcome: Progressing   Problem: Nutrition: Goal: Adequate nutrition will be maintained Outcome: Progressing   Problem: Coping: Goal: Level of anxiety will decrease Outcome: Progressing   Problem: Elimination: Goal: Will not experience complications related to bowel motility Outcome: Progressing Goal: Will not experience complications related to urinary retention Outcome: Progressing   Problem: Pain Managment: Goal: General experience of comfort will improve Outcome: Progressing   Problem: Safety: Goal: Ability to remain free from injury will improve Outcome: Progressing

## 2020-06-02 ENCOUNTER — Observation Stay (HOSPITAL_COMMUNITY): Payer: PPO

## 2020-06-02 DIAGNOSIS — I4819 Other persistent atrial fibrillation: Secondary | ICD-10-CM | POA: Diagnosis not present

## 2020-06-02 DIAGNOSIS — K219 Gastro-esophageal reflux disease without esophagitis: Secondary | ICD-10-CM | POA: Diagnosis not present

## 2020-06-02 DIAGNOSIS — R911 Solitary pulmonary nodule: Secondary | ICD-10-CM | POA: Diagnosis not present

## 2020-06-02 DIAGNOSIS — K922 Gastrointestinal hemorrhage, unspecified: Secondary | ICD-10-CM | POA: Diagnosis not present

## 2020-06-02 DIAGNOSIS — R0781 Pleurodynia: Secondary | ICD-10-CM | POA: Diagnosis not present

## 2020-06-02 DIAGNOSIS — I517 Cardiomegaly: Secondary | ICD-10-CM | POA: Diagnosis not present

## 2020-06-02 DIAGNOSIS — K921 Melena: Secondary | ICD-10-CM | POA: Diagnosis not present

## 2020-06-02 DIAGNOSIS — R918 Other nonspecific abnormal finding of lung field: Secondary | ICD-10-CM | POA: Diagnosis not present

## 2020-06-02 DIAGNOSIS — I5032 Chronic diastolic (congestive) heart failure: Secondary | ICD-10-CM | POA: Diagnosis not present

## 2020-06-02 DIAGNOSIS — R0789 Other chest pain: Secondary | ICD-10-CM

## 2020-06-02 DIAGNOSIS — R0602 Shortness of breath: Secondary | ICD-10-CM | POA: Diagnosis not present

## 2020-06-02 LAB — HEMOGLOBIN AND HEMATOCRIT, BLOOD
HCT: 32.2 % — ABNORMAL LOW (ref 36.0–46.0)
Hemoglobin: 10.7 g/dL — ABNORMAL LOW (ref 12.0–15.0)

## 2020-06-02 MED ORDER — RIVAROXABAN 15 MG PO TABS: 15.0000 mg | ORAL_TABLET | Freq: Every day | ORAL | 0 refills | Status: DC

## 2020-06-02 MED ORDER — FUROSEMIDE 40 MG PO TABS
40.0000 mg | ORAL_TABLET | Freq: Every day | ORAL | Status: DC
  Administered 2020-06-02: 40 mg via ORAL
  Filled 2020-06-02: qty 1

## 2020-06-02 MED ORDER — PANTOPRAZOLE SODIUM 40 MG PO TBEC: 40.0000 mg | DELAYED_RELEASE_TABLET | Freq: Every day | ORAL | 0 refills | Status: DC

## 2020-06-02 NOTE — Progress Notes (Addendum)
Daily Rounding Note  06/02/2020, 10:08 AM  LOS: 0 days   SUBJECTIVE:   Chief complaint: Hematochezia.  Chronic right upper quadrant pain     Stools are loose, mucoid and yellow/brown.  No further bleeding. Right upper quadrant discomfort continues, not severe. Mild nausea this morning with omelette for breakfast but no vomiting.  Expresses desire to discharge home if care providers are agreeable.  OBJECTIVE:         Vital signs in last 24 hours:    Temp:  [97.4 F (36.3 C)-98 F (36.7 C)] 97.7 F (36.5 C) (03/31 0914) Pulse Rate:  [69-75] 69 (03/31 0914) Resp:  [18] 18 (03/31 0914) BP: (98-124)/(43-69) 109/47 (03/31 0914) SpO2:  [96 %-100 %] 96 % (03/31 0914) Last BM Date: 06/01/20 Filed Weights   06/01/20 0900  Weight: 65.5 kg   General: Looks well.  A bit anxious.  Hard of hearing Heart: RRR.  No MRG.  S1, S2 present. Chest: Clear bilaterally.  No labored breathing.  Excellent breath sounds. Abdomen: Soft, nondistended, active bowel sounds.  Minor tenderness focally in the right upper quadrant.  No tenderness in the left abdomen. Extremities: No CCE. Neuro/Psych: Alert.  Oriented x3.  Hard of hearing.  No gross deficits.  Intake/Output from previous day: 03/30 0701 - 03/31 0700 In: 1000 [I.V.:1000] Out: -   Intake/Output this shift: No intake/output data recorded.  Lab Results: Recent Labs    05/31/20 1339 05/31/20 2112 06/01/20 0631 06/01/20 2017  WBC 6.3  --   --   --   HGB 11.4* 11.2* 11.1* 10.3*  HCT 34.4* 33.3* 33.7* 31.8*  PLT 152  --   --   --    BMET Recent Labs    05/31/20 1339 06/01/20 0631  NA 137 139  K 3.9 3.8  CL 103 103  CO2 24 25  GLUCOSE 118* 110*  BUN 30* 26*  CREATININE 1.15* 1.14*  CALCIUM 9.6 9.5   LFT Recent Labs    05/31/20 1339  PROT 7.0  ALBUMIN 4.0  AST 25  ALT 19  ALKPHOS 77  BILITOT 1.3*   PT/INR Recent Labs    05/31/20 1339  LABPROT 19.2*   INR 1.7*   Hepatitis Panel No results for input(s): HEPBSAG, HCVAB, HEPAIGM, HEPBIGM in the last 72 hours.  Studies/Results: CT ABDOMEN PELVIS W CONTRAST  Result Date: 06/01/2020 CLINICAL DATA:  Abdominal pain and rectal bleeding. EXAM: CT ABDOMEN AND PELVIS WITH CONTRAST TECHNIQUE: Multidetector CT imaging of the abdomen and pelvis was performed using the standard protocol following bolus administration of intravenous contrast. CONTRAST:  57mL OMNIPAQUE IOHEXOL 300 MG/ML  SOLN COMPARISON:  Most recent CT 07/19/2019 FINDINGS: Lower chest: Cardiomegaly with primarily right heart dilatation. Contrast refluxes into the hepatic veins and IVC. Pacemaker partially included. No pleural fluid. Dependent atelectasis in the lower lobes. Hepatobiliary: Diffusely decreased hepatic density typical of steatosis. Punctate granuloma. No focal lesion. Cholecystectomy without biliary dilatation. Pancreas: Parenchymal atrophy. No ductal dilatation or inflammation. Spleen: Calcified granuloma.  Normal in size. Adrenals/Urinary Tract: No adrenal nodule. No hydronephrosis or perinephric edema. Homogeneous renal enhancement. Diminished renal excretion on delayed phase imaging. Urinary bladder is physiologically distended without wall thickening. Stomach/Bowel: Small hiatal hernia. Stomach otherwise unremarkable. Small duodenal diverticulum without inflammation. Unremarkable small bowel without obstruction or inflammation. Appendix not confidently visualized. No evidence of appendicitis. Administered enteric contrast reaches the transverse colon. Colonic diverticulosis from the splenic flexure distally. Prominently involving the distal  descending and sigmoid colon. Suggestion of mild mural hypertrophy. Faint areas of pericolonic fat stranding involving the distal sigmoid, series 10, image 59, may represent mild diverticulitis in the appropriate clinical setting. Vascular/Lymphatic: Aortic atherosclerosis. No aortic aneurysm.  Incidental replaced right hepatic artery, variant arterial anatomy. Portal vein is patent. No enlarged lymph nodes in the abdomen or pelvis. Reproductive: Status post hysterectomy. No adnexal masses. Other: Small amount perihepatic ascites. No free air or focal fluid collection. Minimal fat in the inguinal canals. Musculoskeletal: There are no acute or suspicious osseous abnormalities. Grade 1 anterolisthesis of L5 on S1 is unchanged from prior exam, likely facet mediated. IMPRESSION: 1. Colonic diverticulosis from the splenic flexure distally. Faint areas of pericolonic fat stranding involving the distal sigmoid colon in the region of multiple diverticula may represent mild diverticulitis in the appropriate clinical setting. 2. Hepatic steatosis. Small amount of perihepatic ascites. 3. Cardiomegaly with primarily right heart dilatation. Contrast refluxes into the hepatic veins and IVC, suggesting elevated right heart pressures. Aortic Atherosclerosis (ICD10-I70.0). Electronically Signed   By: Keith Rake M.D.   On: 06/01/2020 16:52    ASSESMENT:   *   Painless hematochezia.  Likely diverticular source.  Bleeding resolved.  No plans for colonoscopy due to advanced age and resolution of bleeding. 06/01/2020  CTAP w contrast: Colonic diverticulosis involving distal splenic flexure, distal descending and sigmoid colon.  S/o colonic mural hypertrophy associated with diverticulosis.  Faint pericolonic fat stranding at the distal sigmoid, might represent mild diverticulitis in appropriate clinical setting.  *   Normocytic anemia.   Hgb 11.4 >> 10.3.    *   Chronic RUQ pain radiating to scapula since lap chole 12/2019.  Underwent ERCP, sphincterotomy, stone extraction 07/2018.  Weight loss CTAP shows hepatic steatosis and mild perihepatic ascites.  Also reveals cardiomegaly and primarily right heart dilation.  Refluxing contrast into hepatic veins and IVC suggest elevated right heart pressures.  10/2019 2D  echo with LVEF 60 to 65%, concentric hypertrophy, unable to accurately assess diastolic function.  *    Chronic Xarelto for A fib.  On hold.  Last dose Monday 3/20 p.m.  *   CKD stage 3a - 3b.     PLAN   *    Discussed Mrs. Garrott's case with Dr. Lyndel Safe No plans to initiate antibiotics as we do not suspect diverticulitis. Right upper quadrant pain is not related to diverticulosis. Suggested she stay on daily PPI for a couple of months to see if that would help.  Pt prefers to stick w daily Tums previously recommended by her PCP.   fup w Dr Ardis Hughs prn.    Ok to restart Xarelto on 4/1.      Azucena Freed  06/02/2020, 10:08 AM Phone [phone]     Attending physician's note   I have taken an interval history, reviewed the chart and examined the patient. I agree with the Advanced Practitioner's note, impression and recommendations.   No further bleeding. Hb stable CT reviewed.  It shows evidence of diverticulosis.  No definite diverticulitis.  Clinically, she does not have diverticulitis.  Hence, would not give her antibiotics. She does not want endoscopic proc d/t adv age.  I agree. Can resume Xarelto 4/1. We will sign off for now. FU GI PRN  Carmell Austria, MD [facility] [phone]

## 2020-06-02 NOTE — Discharge Summary (Addendum)
Discharge Summary  Beverly Kim:[id number] DOB: 09-Jun-1924  PCP: [name], MD  Admit date: 05/31/2020 Discharge date: 06/02/2020  Time spent:  61mins  Recommendations for Outpatient Follow-up:  1. F/u with PCP within a week  for hospital discharge follow up, repeat cbc/bmp at follow up 2. F/u with GI prn 3. F/u with cardiology Dr Ellyn Hack    Discharge Diagnoses:  Active Hospital Problems   Diagnosis Date Noted  . GI bleed 05/31/2020  . Persistent atrial fibrillation CHA2DS2-VASc score =5; Xarelto 15 mg 01/23/2019  . GERD 07/06/2009  . Essential hypertension 06/19/2008  . Chronic diastolic heart failure ([place]) 12/02/2006  . Hypothyroidism 12/02/2006    Resolved Hospital Problems  No resolved problems to display.    Discharge Condition: stable  Diet recommendation: regular diet  Filed Weights   06/01/20 0900  Weight: 65.5 kg    History of present illness: ( per admitting MD Dr Karleen Hampshire) Chief Complaint: rectal bleeding from from 5 days.   HPI: Beverly Kim is a 87 y.o. female with medical history significant of GERD, hypertension, hyperlipidemia, paroxysmal atrial fibrillation on Xarelto, history of car complete heart block s/p pacemaker, chronic diastolic heart failure with preserved EF, presents to ED with intermittent rectal bleeding and black stools since Wednesday.  Patient reports she has some cramping pain, no nausea vomiting. Patient denies any fevers chest pain shortness of breath, syncopal episodes, dysuria, hemoptysis or hematemesis.  She reports she uses ibuprofen and Tylenol occasionally.  Her last colonoscopy/EGD was more than 10 years ago. ED Course: On arrival to ED she was afebrile, tachypneic with a RR of 22/min, normotensive.  Labs were significant for hemoglobin of 11.4, hematocrit of 34.4.1, INR of 1.7.  Stool for occult blood was positive. He showed atrial fibrillation.  Patient's baseline hemoglobin is not far from today's hemoglobin. GI  consulted and she will be seen tomorrow She will be n.p.o. after midnight. She was [facility] to [facility] for admission for evaluation of rectal bleed  Hospital Course:  Active Problems:   Hypothyroidism   Essential hypertension   Chronic diastolic heart failure (HCC)   GERD   Persistent atrial fibrillation CHA2DS2-VASc score =5; Xarelto 15 mg   GI bleed  GI bleed Likely diverticular source Xarelto held Bleeding appeared has resolved GI consulted, no plan for colonoscopy due to advanced age and resolution of bleeding CT abdomen/pel "Faint pericolonic fat stranding at the distal sigmoid, might represent mild diverticulitis in appropriate clinical setting." patient does not have left side ab pain, GI does not recommend antibiotics GI recommend ppi, patient prefers calms previously recommended by her PCP -GI cleared patient to discharge home, and recommend resumption Xarelto on April 1st, f/u with pcp and GI   Right side rib pain,  Cxr " Cardiac pacer with lead tips over the right atrium right ventricle. Cardiomegaly. No pulmonary venous congestion. Calcified left hilar lymph nodes and bilateral pulmonary nodules again noted consistent with prior granulomas disease. Left base pleural-parenchymal thickening again noted consistent scarring. Stable punctate well-circumscribed lucency noted the proximal left humerus. Old posttraumatic changes noted the proximal left humerus, unchanged. No acute bony abnormality identified"  f/u with pcp  Paroxysmal A. Fib On bisoprolol Xarelto held in the hospital due to GI bleeding, GI okayed to resume Xarelto 4/1 She follows with Dr Ellyn Hack, she is wonder she could take lower dose of Xarelto, she report intermittent nosebleed and gum bleed as well, advised her to follow-up with Dr. Ellyn Hack to decide Xarelto dose  History of diastolic CHF She received gentle hydration on presentation, and holding Lasix metolazone, this was resumed on March 31 Appear  euvolemic at discharge  History of heart block status post pacemaker  CKD 3B Renal function appear close to baseline Renal dosing medication  Hypothyroidism Continue Synthroid  Code Status: Full Family Communication: son over the phone  Procedures:  none  Consultations:  GI  Discharge Exam: BP (!) 109/47 (BP Location: Right Arm)   Pulse 69   Temp 97.7 F (36.5 C) (Oral)   Resp 18   Ht 5\' 2"  (1.575 m)   Wt 65.5 kg   SpO2 96%   BMI 26.43 kg/m   General: NAD, hard of hearing, aaox3, tenderness right lower rib Cardiovascular: RRR Respiratory: CTABL  Discharge Instructions You were cared for by a hospitalist during your hospital stay. If you have any questions about your discharge medications or the care you received while you were in the hospital after you are discharged, you can call the unit and asked to speak with the hospitalist on call if the hospitalist that took care of you is not available. Once you are discharged, your primary care physician will handle any further medical issues. Please note that NO REFILLS for any discharge medications will be authorized once you are discharged, as it is imperative that you return to your primary care physician (or establish a relationship with a primary care physician if you do not have one) for your aftercare needs so that they can reassess your need for medications and monitor your lab values.  Discharge Instructions    Diet general   Complete by: As directed    Increase activity slowly   Complete by: As directed      Allergies as of 06/02/2020      Reactions   Adhesive [tape] Itching, Dermatitis, Rash, Other (See Comments)   Blisters and "skin bubbles"   Ace Inhibitors Cough   Atorvastatin Other (See Comments)   REACTION: Reaction not known   Clindamycin Other (See Comments)   Unknown   Codeine Other (See Comments)   hallucinations    Latex Other (See Comments)   blisters   Shellfish Allergy Other (See  Comments)   "gallbladder attack"   Simvastatin Other (See Comments)   fatigue   Penicillins Rash   Has patient had a PCN reaction causing immediate rash, facial/tongue/throat swelling, SOB or lightheadedness with hypotension: Unknown Has patient had a PCN reaction causing severe rash involving mucus membranes or skin necrosis: Unknown Has patient had a PCN reaction that required hospitalization: Unknown Has patient had a PCN reaction occurring within the last 10 years: No If all of the above answers are "NO", then may proceed with Cephalosporin use.      Medication List    STOP taking these medications   ibuprofen 200 MG tablet Commonly known as: ADVIL     TAKE these medications   acetaminophen 650 MG CR tablet Commonly known as: TYLENOL Take 650 mg by mouth every 8 (eight) hours as needed for pain.   albuterol 108 (90 Base) MCG/ACT inhaler Commonly known as: VENTOLIN HFA Inhale 1-2 puffs into the lungs every 6 (six) hours as needed for wheezing or shortness of breath.   bisoprolol 5 MG tablet Commonly known as: ZEBETA Take 0.5 tablets (2.5 mg total) by mouth 2 (two) times daily.   furosemide 40 MG tablet Commonly known as: LASIX Take 1 tablet by mouth once daily   Melatonin 5 MG Caps Take 5  mg by mouth at bedtime as needed (sleep).   metolazone 2.5 MG tablet Commonly known as: ZAROXOLYN TAKE ONE TABLET BY MOUTH THREE TIMES A WEEK What changed:   how much to take  how to take this  when to take this  additional instructions   pantoprazole 40 MG tablet Commonly known as: PROTONIX Take 1 tablet (40 mg total) by mouth daily. Start taking on: June 03, 2020   potassium chloride 10 MEQ tablet Commonly known as: KLOR-CON Take 1 tablet by mouth on days metolazone are taken What changed:   how much to take  how to take this  when to take this  reasons to take this  additional instructions   Rivaroxaban 15 MG Tabs tablet Commonly known as: Xarelto Take  1 tablet (15 mg total) by mouth daily with supper. Please discuss dose adjustment with your cardiology Dr Ellyn Hack if you wish the dose to be reduced Start taking on: June 03, 2020 What changed:   how much to take  additional instructions   Synthroid 88 MCG tablet Generic drug: levothyroxine TAKE 1 TABLET BY MOUTH ONCE DAILY BEFORE BREAKFAST What changed: how much to take   Trelegy Ellipta 100-62.5-25 MCG/INH Aepb Generic drug: Fluticasone-Umeclidin-Vilant Inhale 1 puff into the lungs daily.      Allergies  Allergen Reactions  . Adhesive [Tape] Itching, Dermatitis, Rash and Other (See Comments)    Blisters and "skin bubbles"  . Ace Inhibitors Cough  . Atorvastatin Other (See Comments)    REACTION: Reaction not known  . Clindamycin Other (See Comments)    Unknown  . Codeine Other (See Comments)    hallucinations   . Latex Other (See Comments)    blisters  . Shellfish Allergy Other (See Comments)    "gallbladder attack"  . Simvastatin Other (See Comments)    fatigue  . Penicillins Rash    Has patient had a PCN reaction causing immediate rash, facial/tongue/throat swelling, SOB or lightheadedness with hypotension: Unknown Has patient had a PCN reaction causing severe rash involving mucus membranes or skin necrosis: Unknown Has patient had a PCN reaction that required hospitalization: Unknown Has patient had a PCN reaction occurring within the last 10 years: No If all of the above answers are "NO", then may proceed with Cephalosporin use.     Follow-up Information    Milus Banister, MD Follow up.   Specialty: Gastroenterology Contact information: [address] [address] [phone]        [name], MD Follow up in 1 week(s).   Specialty: Internal Medicine Why: hospital discharge follow up Contact information: [place] [address] [phone]        Leonie Man, MD .   Specialty: Cardiology Contact  information: [address] [place] [address] [phone]                The results of significant diagnostics from this hospitalization (including imaging, microbiology, ancillary and laboratory) are listed below for reference.    Significant Diagnostic Studies: CT ABDOMEN PELVIS W CONTRAST  Result Date: 06/01/2020 CLINICAL DATA:  Abdominal pain and rectal bleeding. EXAM: CT ABDOMEN AND PELVIS WITH CONTRAST TECHNIQUE: Multidetector CT imaging of the abdomen and pelvis was performed using the standard protocol following bolus administration of intravenous contrast. CONTRAST:  15mL OMNIPAQUE IOHEXOL 300 MG/ML  SOLN COMPARISON:  Most recent CT 07/19/2019 FINDINGS: Lower chest: Cardiomegaly with primarily right heart dilatation. Contrast refluxes into the hepatic  veins and IVC. Pacemaker partially included. No pleural fluid. Dependent atelectasis in the lower lobes. Hepatobiliary: Diffusely decreased hepatic density typical of steatosis. Punctate granuloma. No focal lesion. Cholecystectomy without biliary dilatation. Pancreas: Parenchymal atrophy. No ductal dilatation or inflammation. Spleen: Calcified granuloma.  Normal in size. Adrenals/Urinary Tract: No adrenal nodule. No hydronephrosis or perinephric edema. Homogeneous renal enhancement. Diminished renal excretion on delayed phase imaging. Urinary bladder is physiologically distended without wall thickening. Stomach/Bowel: Small hiatal hernia. Stomach otherwise unremarkable. Small duodenal diverticulum without inflammation. Unremarkable small bowel without obstruction or inflammation. Appendix not confidently visualized. No evidence of appendicitis. Administered enteric contrast reaches the transverse colon. Colonic diverticulosis from the splenic flexure distally. Prominently involving the distal descending and sigmoid colon. Suggestion of mild mural hypertrophy. Faint areas of pericolonic fat stranding involving the distal  sigmoid, series 10, image 59, may represent mild diverticulitis in the appropriate clinical setting. Vascular/Lymphatic: Aortic atherosclerosis. No aortic aneurysm. Incidental replaced right hepatic artery, variant arterial anatomy. Portal vein is patent. No enlarged lymph nodes in the abdomen or pelvis. Reproductive: Status post hysterectomy. No adnexal masses. Other: Small amount perihepatic ascites. No free air or focal fluid collection. Minimal fat in the inguinal canals. Musculoskeletal: There are no acute or suspicious osseous abnormalities. Grade 1 anterolisthesis of L5 on S1 is unchanged from prior exam, likely facet mediated. IMPRESSION: 1. Colonic diverticulosis from the splenic flexure distally. Faint areas of pericolonic fat stranding involving the distal sigmoid colon in the region of multiple diverticula may represent mild diverticulitis in the appropriate clinical setting. 2. Hepatic steatosis. Small amount of perihepatic ascites. 3. Cardiomegaly with primarily right heart dilatation. Contrast refluxes into the hepatic veins and IVC, suggesting elevated right heart pressures. Aortic Atherosclerosis (ICD10-I70.0). Electronically Signed   By: Keith Rake M.D.   On: 06/01/2020 16:52   DG CHEST PORT 1 VIEW  Result Date: 06/02/2020 CLINICAL DATA:  Shortness of breath. EXAM: PORTABLE CHEST 1 VIEW COMPARISON:  Chest x-ray 07/23/2019. FINDINGS: Cardiac pacer with lead tips over the right atrium right ventricle. Cardiomegaly. No pulmonary venous congestion. Calcified left hilar lymph nodes and bilateral pulmonary nodules again noted consistent with prior granulomas disease. Left base pleural-parenchymal thickening again noted consistent scarring. Stable punctate well-circumscribed lucency noted the proximal left humerus. Old posttraumatic changes noted the proximal left humerus, unchanged. No acute bony abnormality identified. IMPRESSION: 1.  Cardiac pacer stable position.  Stable cardiomegaly. 2.  Evidence of prior granulomas disease. No acute pulmonary disease identified. Electronically Signed   By: Marcello Moores  Register   On: 06/02/2020 14:59   CUP PACEART REMOTE DEVICE CHECK  Result Date: 05/20/2020 Scheduled remote reviewed. Normal device function.  Known persistent AF Next remote 91 days. HB   Microbiology: Recent Results (from the past 240 hour(s))  SARS CORONAVIRUS 2 (TAT 6-24 HRS) Nasopharyngeal Nasopharyngeal Swab     Status: None   Collection Time: 05/31/20  5:52 PM   Specimen: Nasopharyngeal Swab  Result Value Ref Range Status   SARS Coronavirus 2 NEGATIVE NEGATIVE Final    Comment: (NOTE) SARS-CoV-2 target nucleic acids are NOT DETECTED.  The SARS-CoV-2 RNA is generally detectable in upper and lower respiratory specimens during the acute phase of infection. Negative results do not preclude SARS-CoV-2 infection, do not rule out co-infections with other pathogens, and should not be used as the sole basis for treatment or other patient management decisions. Negative results must be combined with clinical observations, patient history, and epidemiological information. The expected result is Negative.  Fact Sheet for Patients: SugarRoll.be  Fact Sheet for Healthcare Providers: https://www.woods-mathews.com/  This test is not yet approved or cleared by the [place] FDA and  has been authorized for detection and/or diagnosis of SARS-CoV-2 by FDA under an Emergency Use Authorization (EUA). This EUA will remain  in effect (meaning this test can be used) for the duration of the COVID-19 declaration under Se ction 564(b)(1) of the Act, 21 U.S.C. section 360bbb-3(b)(1), unless the authorization is terminated or revoked sooner.  Performed at [facility], [place] [address] [place], [place] [address]      Labs: Basic Metabolic Panel: Recent Labs  Lab 05/31/20 1339 06/01/20 0631  NA 137 139  K 3.9 3.8  CL 103 103   CO2 24 25  GLUCOSE 118* 110*  BUN 30* 26*  CREATININE 1.15* 1.14*  CALCIUM 9.6 9.5   Liver Function Tests: Recent Labs  Lab 05/31/20 1339  AST 25  ALT 19  ALKPHOS 77  BILITOT 1.3*  PROT 7.0  ALBUMIN 4.0   No results for input(s): LIPASE, AMYLASE in the last 168 hours. No results for input(s): AMMONIA in the last 168 hours. CBC: Recent Labs  Lab 05/31/20 1339 05/31/20 2112 06/01/20 0631 06/01/20 2017 06/02/20 1123  WBC 6.3  --   --   --   --   HGB 11.4* 11.2* 11.1* 10.3* 10.7*  HCT 34.4* 33.3* 33.7* 31.8* 32.2*  MCV 98.6  --   --   --   --   PLT 152  --   --   --   --    Cardiac Enzymes: No results for input(s): CKTOTAL, CKMB, CKMBINDEX, TROPONINI in the last 168 hours. BNP: BNP (last 3 results) Recent Labs    07/19/19 0638  BNP 467.4*    ProBNP (last 3 results) No results for input(s): PROBNP in the last 8760 hours.  CBG: No results for input(s): GLUCAP in the last 168 hours.     Signed:  Florencia Reasons MD, PhD, FACP  [facility] 06/02/2020, 4:29 PM

## 2020-06-02 NOTE — Progress Notes (Signed)
Patient cleared to discharge with patient set to be transported to home by her son.  Patient and son received AVS discharge paperwork including education by primary RN concerning changes to medications and follow up appointments with both patient and family member confirming their understanding of the reviewed material and no questions or concerns at this time.  Patient's PIV has been removed prior to discharging and will be transported off the floor via wheelchair by this RN.

## 2020-06-21 ENCOUNTER — Other Ambulatory Visit: Payer: Self-pay | Admitting: Internal Medicine

## 2020-06-28 ENCOUNTER — Encounter: Payer: Self-pay | Admitting: Internal Medicine

## 2020-06-28 DIAGNOSIS — N1831 Chronic kidney disease, stage 3a: Secondary | ICD-10-CM | POA: Insufficient documentation

## 2020-06-28 DIAGNOSIS — N183 Chronic kidney disease, stage 3 unspecified: Secondary | ICD-10-CM | POA: Insufficient documentation

## 2020-06-28 NOTE — Progress Notes (Signed)
Subjective:    Patient ID: Beverly Kim, female    DOB: 11/13/1924, 87 y.o.   MRN: [id number]  HPI The patient is here for follow up of their chronic medical problems, including htn, hypothyroidism,    S/p lab chole 12/2019. Admitted 05/31/20 for GIB   Went to ED for intermittent rectal bleeding and black stools x 5 days.  Has some abd cramping, no nausea. hgb 11.4, stool pos for occult blood.  Gi saw her - likely diverticular bloeed.  xarelto held. Bleeding appeared to have stopped.  No plan for colonoscopy due to age.  Ct A/P showed faint pericolonoic at stradning on distal sigmoid, may represent mild deverticultiis - no abx since no pain.  xarelto restarted 4/1.   Lasix and metolazone held briefly and she received mild hydration.  meds resumed on 3/31.     Neuropathy in feet - She feels less energy.  Her feet like they do not have balance in her feet.  She has pins and needles in her feet.  It has worse at night - her toes are painful.  It is intermittent.     She takes zarolxyln once a week.  She is compliant with a low sodium diet.    Medications and allergies reviewed with patient and updated if appropriate.  Patient Active Problem List   Diagnosis Date Noted  . Diabetic neuropathy ([place]) 06/29/2020  . CKD (chronic kidney disease) stage 3, GFR 30-59 ml/min (HCC) 06/28/2020  . Rib pain on right side   . GI bleed 05/31/2020  . Biliary colic 29/92/4268  . Cardiac pacemaker 09/17/2019  . Preop cardiovascular exam 09/17/2019  . Vertigo 09/12/2019  . Unsteady gait when walking 09/12/2019  . Dyshidrotic eczema 09/12/2019  . Nausea 08/05/2019  . Black stool 07/31/2019  . Fluid overload 07/31/2019  . Abdominal pain   . Persistent atrial fibrillation CHA2DS2-VASc score =5; Xarelto 15 mg 01/23/2019  . Secondary hypercoagulable state ([place]) 01/23/2019  . Fatigue 11/25/2018  . Poor balance 10/22/2018  . Pyelonephritis 07/25/2017  . Carotid disease, bilateral ([place]) 04/08/2017   . Leg pain 04/08/2017  . Non-rheumatic mitral regurgitation 07/12/2016  . Complete heart block ([place]) 05/14/2016  . Diabetes mellitus without complication ([place]) 34/19/6222  . Degenerative disc disease, cervical 08/23/2015  . Degenerative disc disease, lumbar 08/23/2015  . Left rotator cuff tear arthropathy 07/12/2015  . Left shoulder pain 04/06/2015  . Frozen shoulder 01/03/2015  . Cough variant asthma 12/08/2013  . Upper airway cough syndrome 10/27/2013  . Dyspnea 02/03/2013  . GERD 07/06/2009  . HIATAL HERNIA 07/06/2009  . DIVERTICULOSIS, COLON 07/06/2009  . HEPATIC CYST 07/06/2009  . COLONIC POLYPS, HX OF 07/06/2009  . MIXED HEARING LOSS BILATERAL 12/13/2008  . Essential hypertension 06/19/2008  . MITRAL VALVE PROLAPSE 06/19/2008  . LBBB (left bundle branch block) 06/19/2008  . Hyperlipidemia 08/12/2007  . Hypothyroidism 12/02/2006  . CAD (coronary artery disease) 12/02/2006  . Chronic diastolic heart failure ([place]) 12/02/2006    Current Outpatient Medications on File Prior to Visit  Medication Sig Dispense Refill  . acetaminophen (TYLENOL) 650 MG CR tablet Take 650 mg by mouth every 8 (eight) hours as needed for pain.    Marland Kitchen albuterol (PROVENTIL HFA;VENTOLIN HFA) 108 (90 Base) MCG/ACT inhaler Inhale 1-2 puffs into the lungs every 6 (six) hours as needed for wheezing or shortness of breath. 1 Inhaler 0  . bisoprolol (ZEBETA) 5 MG tablet Take 0.5 tablets (2.5 mg total) by mouth 2 (two) times daily.  90 tablet 3  . Fluticasone-Umeclidin-Vilant (TRELEGY ELLIPTA) 100-62.5-25 MCG/INH AEPB Inhale 1 puff into the lungs daily. 60 each 3  . furosemide (LASIX) 40 MG tablet Take 1 tablet by mouth once daily (Patient taking differently: Take 40 mg by mouth daily.) 90 tablet 3  . Melatonin 5 MG CAPS Take 5 mg by mouth at bedtime as needed (sleep).    . metolazone (ZAROXOLYN) 2.5 MG tablet TAKE ONE TABLET BY MOUTH THREE TIMES A WEEK (Patient taking differently: Take 2.5 mg by mouth See admin  instructions. Take 2.5 mg 3 times weekly when needed for swelling) 30 tablet 5  . pantoprazole (PROTONIX) 40 MG tablet Take 1 tablet (40 mg total) by mouth daily. 30 tablet 0  . potassium chloride (KLOR-CON) 10 MEQ tablet Take 1 tablet by mouth on days metolazone are taken (Patient taking differently: Take 10 mEq by mouth daily as needed (when taking metolazone).) 30 tablet 3  . Rivaroxaban (XARELTO) 15 MG TABS tablet Take 1 tablet (15 mg total) by mouth daily with supper. Please discuss dose adjustment with your cardiology Dr Ellyn Hack if you wish the dose to be reduced 30 tablet 0  . SYNTHROID 88 MCG tablet Take 1 tablet (88 mcg total) by mouth daily before breakfast. 90 tablet 0   No current facility-administered medications on file prior to visit.    Past Medical History:  Diagnosis Date  . Cardiac pacemaker 05/2016   Sick sinus syndrome/complete heart block  . Chronic heart failure with preserved ejection fraction (HFpEF) ([place])   . Colon polyps   . Diverticulosis   . Dyspnea   . GERD (gastroesophageal reflux disease)   . Hearing loss of both ears   . Hepatic cyst   . Hiatal hernia   . Hyperlipidemia   . Hypertension   . Hypothyroidism   . Low back pain   . Non-occlusive coronary artery disease 04/2001   Cardiac Cath 04/18/2001: 50 and 70% mid LAD after D1. = Nonischemic by Myoview.  Medical management.  . Other left bundle branch block   . PAF (paroxysmal atrial fibrillation) ([place]) 08/16/2016   observed on PPM interrogation, asymptomatic, chad2vasc score is 5.  Bisoprolol for rate control, Xarelto for anticoagulation.  Marland Kitchen URI (upper respiratory infection)     Past Surgical History:  Procedure Laterality Date  . CARDIOVERSION N/A 01/16/2019   Procedure: CARDIOVERSION;  Surgeon: Josue Hector, MD;  Location: [facility] ENDOSCOPY;  Service: Cardiovascular;  Laterality: N/A;  . CHOLECYSTECTOMY N/A 12/15/2019   Procedure: LAPAROSCOPIC CHOLECYSTECTOMY;  Surgeon: Stark Klein, MD;   Location: [facility];  Service: General;  Laterality: N/A;  . COLONOSCOPY    . ERCP N/A 07/21/2019   Procedure: ENDOSCOPIC RETROGRADE CHOLANGIOPANCREATOGRAPHY (ERCP);  Surgeon: Milus Banister, MD;  Location: [facility] ENDOSCOPY;  Service: Endoscopy;  Laterality: N/A;  . HERNIA REPAIR    . LEFT HEART CATH AND CORONARY ANGIOGRAPHY  04/18/2001   Dr. Lia Foyer: EF 60 to 65%.  2+ MR.  Upper LM takeoff with 90 degree bend just inside the ostium.  50-70% mid LAD after D1.  D1 is 30%.    Distal LAD wraps the apex and is free of disease.  LCx is mostly large OM branch.  RCA provides PDA and PL branches and Free of disease. -->  Presumably evaluated by Myoview that was nonischemic, therefore no PCI performed.  Marland Kitchen NM MYOVIEW LTD  01/2012   Normal EF.  Normal study.  No ischemia or infarction.  Marland Kitchen PACEMAKER IMPLANT Left 05/15/2016  SJM Assirity MRI dual chamber PPM implanted by Dr Rayann Heman for CHB  . REMOVAL OF STONES  07/21/2019   Procedure: REMOVAL OF STONES;  Surgeon: Milus Banister, MD;  Location: [facility] ENDOSCOPY;  Service: Endoscopy;;  . [name]  07/21/2019   Procedure: [name];  Surgeon: Milus Banister, MD;  Location: [facility] ENDOSCOPY;  Service: Endoscopy;;  . THYROIDECTOMY    . TONSILLECTOMY AND ADENOIDECTOMY    . TOTAL ABDOMINAL HYSTERECTOMY W/ BILATERAL SALPINGOOPHORECTOMY    . TRANSTHORACIC ECHOCARDIOGRAM  06/27/2016   Moderate basal-septal LVH, EF 60 to 65%.  No R WMA.  GR 1 DD.  Mild aortic valve calcification.  No AS.  Trivial MR.--Indicated improved EF compared to prior study in 2015.  Marland Kitchen TRANSTHORACIC ECHOCARDIOGRAM  12/'13; 11/'15   a) EF 50 and 55%.  Normal function.-Moderate MR.  Mild LA dilation.; b) Echo for shortness of breath => reduced EF of 45 to 50%.  Mild LVH.  Inferior reversible HK.  GRII DD.  Moderate MR.  . TRANSTHORACIC ECHOCARDIOGRAM  10/2019   EF 60 -65%.  No RWMA.  Severe concentric LVH.  Unable to assess diastolic function.  Mild biatrial dilation.  Moderate MR moderate TR, no AS     Social History   Socioeconomic History  . Marital status: Widowed    Spouse name: Not on file  . Number of children: 4  . Years of education: Not on file  . Highest education level: Not on file  Occupational History  . Occupation: retired  Tobacco Use  . Smoking status: Never Smoker  . Smokeless tobacco: Never Used  Vaping Use  . Vaping Use: Never used  Substance and Sexual Activity  . Alcohol use: No  . Drug use: No  . Sexual activity: Never  Other Topics Concern  . Not on file  Social History Narrative  . Not on file   Social Determinants of Health   Financial Resource Strain: Low Risk   . Difficulty of Paying Living Expenses: Not hard at all  Food Insecurity: No Food Insecurity  . Worried About Charity fundraiser in the Last Year: Never true  . Ran Out of Food in the Last Year: Never true  Transportation Needs: No Transportation Needs  . Lack of Transportation (Medical): No  . Lack of Transportation (Non-Medical): No  Physical Activity: Not on file  Stress: No Stress Concern Present  . Feeling of Stress : Not at all  Social Connections: Moderately Integrated  . Frequency of Communication with Friends and Family: More than three times a week  . Frequency of Social Gatherings with Friends and Family: More than three times a week  . Attends Religious Services: More than 4 times per year  . Active Member of Clubs or Organizations: Yes  . Attends Archivist Meetings: More than 4 times per year  . Marital Status: Widowed    Family History  Problem Relation Age of Onset  . Coronary artery disease Other        family hx of 1st degree relative <50  . Diabetes Other        family hx of  . Other Other        cardiovascular disorder family hx of  . Other Other        neurological disorder family hx of  . Other Other        respiratory disease family hx of  . Heart attack Daughter   . Heart Problems Other  all children  . Sudden death Son   .  Heart disease Brother   . Heart disease Sister   . Colon cancer Neg Hx   . Colon polyps Neg Hx     Review of Systems  Constitutional: Negative for fever.  Respiratory: Positive for shortness of breath. Negative for cough and wheezing.   Cardiovascular: Positive for leg swelling (occ). Negative for chest pain and palpitations.  Gastrointestinal: Positive for abdominal pain (cramplike pains in her RUQ - not new). Negative for blood in stool (no black stool) and nausea.       Gerd at times  Neurological: Positive for light-headedness (occ).       Objective:   Vitals:   06/29/20 1443  BP: 116/68  Pulse: 70  Temp: 98.6 F (37 C)  SpO2: 97%   BP Readings from Last 3 Encounters:  06/29/20 116/68  06/02/20 (!) 109/47  04/08/20 (!) 124/57   Wt Readings from Last 3 Encounters:  06/01/20 144 lb 8 oz (65.5 kg)  04/08/20 144 lb (65.3 kg)  12/15/19 152 lb 6.4 oz (69.1 kg)   Body mass index is 26.43 kg/m.   Physical Exam    Constitutional: Appears well-developed and well-nourished. No distress.  HENT:  Head: Normocephalic and atraumatic.  Neck: Neck supple. No tracheal deviation present. No thyromegaly present.  No cervical lymphadenopathy Cardiovascular: Normal rate, regular rhythm and normal heart sounds.   No murmur heard. No carotid bruit .  No edema Pulmonary/Chest: Effort normal and breath sounds normal. No respiratory distress. No has no wheezes. No rales.  Skin: Skin is warm and dry. Not diaphoretic.  Psychiatric: Normal mood and affect. Behavior is normal.   Lab Results  Component Value Date   WBC 6.3 05/31/2020   HGB 10.7 (L) 06/02/2020   HCT 32.2 (L) 06/02/2020   PLT 152 05/31/2020   GLUCOSE 110 (H) 06/01/2020   CHOL 205 (H) 08/06/2018   TRIG 156.0 (H) 08/06/2018   HDL 41.70 08/06/2018   LDLDIRECT 163.3 01/30/2012   LDLCALC 132 (H) 08/06/2018   ALT 19 05/31/2020   AST 25 05/31/2020   NA 139 06/01/2020   K 3.8 06/01/2020   CL 103 06/01/2020   CREATININE  1.14 (H) 06/01/2020   BUN 26 (H) 06/01/2020   CO2 25 06/01/2020   TSH 3.20 08/05/2019   INR 1.7 (H) 05/31/2020   HGBA1C 6.6 (H) 12/09/2019      Assessment & Plan:   I spent 30 minutes dedicated to the care of this patient on the date of this encounter including review of recent labs, imaging and procedures, speciality notes, obtaining history, communicating with the patient, ordering medications, tests, and documenting clinical information in the EHR   See Problem List for Assessment and Plan of chronic medical problems.    This visit occurred during the SARS-CoV-2 public health emergency.  Safety protocols were in place, including screening questions prior to the visit, additional usage of staff PPE, and extensive cleaning of exam room while observing appropriate contact time as indicated for disinfecting solutions.

## 2020-06-28 NOTE — Assessment & Plan Note (Addendum)
Recent hospitalization for GI bleed last month Thought to be likely diverticular It did stop on its own so no procedures were done Will follow-up with GI, especially since she is still having some stomach symptoms Will check CBC today Continue pantoprazole 40 mg daily

## 2020-06-28 NOTE — Patient Instructions (Addendum)
    Blood work was ordered.     Medications changes include :  none   Your prescription(s) have been submitted to your pharmacy. Please take as directed and contact our office if you believe you are having problem(s) with the medication(s).   Please followup in 6 months  

## 2020-06-29 ENCOUNTER — Other Ambulatory Visit: Payer: Self-pay

## 2020-06-29 ENCOUNTER — Ambulatory Visit (INDEPENDENT_AMBULATORY_CARE_PROVIDER_SITE_OTHER): Payer: PPO | Admitting: Internal Medicine

## 2020-06-29 VITALS — BP 116/68 | HR 70 | Temp 98.6°F | Ht 62.0 in

## 2020-06-29 DIAGNOSIS — E119 Type 2 diabetes mellitus without complications: Secondary | ICD-10-CM | POA: Diagnosis not present

## 2020-06-29 DIAGNOSIS — N1831 Chronic kidney disease, stage 3a: Secondary | ICD-10-CM | POA: Diagnosis not present

## 2020-06-29 DIAGNOSIS — E1142 Type 2 diabetes mellitus with diabetic polyneuropathy: Secondary | ICD-10-CM | POA: Diagnosis not present

## 2020-06-29 DIAGNOSIS — E038 Other specified hypothyroidism: Secondary | ICD-10-CM

## 2020-06-29 DIAGNOSIS — I5032 Chronic diastolic (congestive) heart failure: Secondary | ICD-10-CM | POA: Diagnosis not present

## 2020-06-29 DIAGNOSIS — I10 Essential (primary) hypertension: Secondary | ICD-10-CM

## 2020-06-29 DIAGNOSIS — K922 Gastrointestinal hemorrhage, unspecified: Secondary | ICD-10-CM

## 2020-06-29 DIAGNOSIS — E1140 Type 2 diabetes mellitus with diabetic neuropathy, unspecified: Secondary | ICD-10-CM | POA: Insufficient documentation

## 2020-06-29 LAB — BASIC METABOLIC PANEL
BUN: 32 mg/dL — ABNORMAL HIGH (ref 6–23)
CO2: 28 mEq/L (ref 19–32)
Calcium: 10.1 mg/dL (ref 8.4–10.5)
Chloride: 97 mEq/L (ref 96–112)
Creatinine, Ser: 1.12 mg/dL (ref 0.40–1.20)
GFR: 42.32 mL/min — ABNORMAL LOW (ref >60.00)
Glucose, Bld: 108 mg/dL — ABNORMAL HIGH (ref 70–99)
Potassium: 4.2 mEq/L (ref 3.5–5.1)
Sodium: 136 mEq/L (ref 135–145)

## 2020-06-29 LAB — CBC WITH DIFFERENTIAL/PLATELET
Basophils Absolute: 0 10*3/uL (ref 0.0–0.1)
Basophils Relative: 0.5 % (ref 0.0–3.0)
Eosinophils Absolute: 0.2 10*3/uL (ref 0.0–0.7)
Eosinophils Relative: 3.3 % (ref 0.0–5.0)
HCT: 35.4 % — ABNORMAL LOW (ref 36.0–46.0)
Hemoglobin: 12 g/dL (ref 12.0–15.0)
Lymphocytes Relative: 25 % (ref 12.0–46.0)
Lymphs Abs: 1.5 10*3/uL (ref 0.7–4.0)
MCHC: 33.9 g/dL (ref 30.0–36.0)
MCV: 95.1 fl (ref 78.0–100.0)
Monocytes Absolute: 0.4 10*3/uL (ref 0.1–1.0)
Monocytes Relative: 7 % (ref 3.0–12.0)
Neutro Abs: 3.7 10*3/uL (ref 1.4–7.7)
Neutrophils Relative %: 64.2 % (ref 43.0–77.0)
Platelets: 161 10*3/uL (ref 150.0–400.0)
RBC: 3.73 Mil/uL — ABNORMAL LOW (ref 3.87–5.11)
RDW: 14.9 % (ref 11.5–15.5)
WBC: 5.8 10*3/uL (ref 4.0–10.5)

## 2020-06-29 LAB — TSH: TSH: 7.09 u[IU]/mL — ABNORMAL HIGH (ref 0.35–4.50)

## 2020-06-29 MED ORDER — SYNTHROID 88 MCG PO TABS: 88.0000 ug | ORAL_TABLET | Freq: Every day | ORAL | 3 refills | Status: DC

## 2020-06-29 NOTE — Assessment & Plan Note (Signed)
Chronic Stable BMP today

## 2020-06-29 NOTE — Assessment & Plan Note (Signed)
Chronic Euvolemic on exam Taking furosemide daily and taking Zaroxolyn once a week-she does notice that by the time she takes the Zaroxolyn she is short of breath and is quite fluid overloaded Advised her not to wait too long before taking this medication even if it is slightly less than 1 week since her last dose

## 2020-06-29 NOTE — Assessment & Plan Note (Signed)
Chronic She was diagnosed with neuropathy years ago She does have pain, numbness, tingling and poor balance

## 2020-06-29 NOTE — Assessment & Plan Note (Signed)
Chronic Continue Synthroid 88 mcg daily Check TSH We will titrate medication dose if necessary

## 2020-06-29 NOTE — Assessment & Plan Note (Signed)
Chronic Diet controlled A1c

## 2020-06-29 NOTE — Assessment & Plan Note (Signed)
Chronic Blood pressure on the low side Has follow-up with Dr. Ellyn Hack Continue bisoprolol 2.5 mg twice daily

## 2020-06-30 ENCOUNTER — Other Ambulatory Visit: Payer: Self-pay | Admitting: Internal Medicine

## 2020-06-30 LAB — HEMOGLOBIN A1C: Hgb A1c MFr Bld: 5.9 % (ref 4.6–6.5)

## 2020-06-30 MED ORDER — SYNTHROID 88 MCG PO TABS: ORAL_TABLET | ORAL | 3 refills | Status: DC

## 2020-07-04 ENCOUNTER — Telehealth: Payer: Self-pay | Admitting: *Deleted

## 2020-07-04 NOTE — Telephone Encounter (Signed)
   Telephone encounter was:  Successful.  07/04/2020 Name: Beverly Kim MRN: [id number] DOB: 1924-04-02  Beverly Kim is a 87 y.o. year old female who is a primary care patient of Burns, Claudina Lick, MD . The community resource team was consulted for assistance with Transportation Needs   Care guide performed the following interventions: Patient provided with information about care guide support team and interviewed to confirm resource needs Discussed resources to assist with Patient wants to call back when she has an appointment to book [place] transportation , presently has no app ts scheduled. Gave care guide number and she states she will call when ready and that addressed all her need as today .  Follow Up Plan:  No further follow up planned at this time. The patient has been provided with needed resources. [place], Care Management  [phone] [address] , [place] [address] Email : [email or web address]

## 2020-07-13 IMAGING — DX DG CHEST 2V
2 series · 2 of 2 positions shown · non-contrast
Comparison: 03/05/2017

CLINICAL DATA: Dyspnea on exertion, dizziness, CHF, hypertension,
diabetes mellitus, coronary artery disease

EXAM:
CHEST - 2 VIEW

[chest pa]
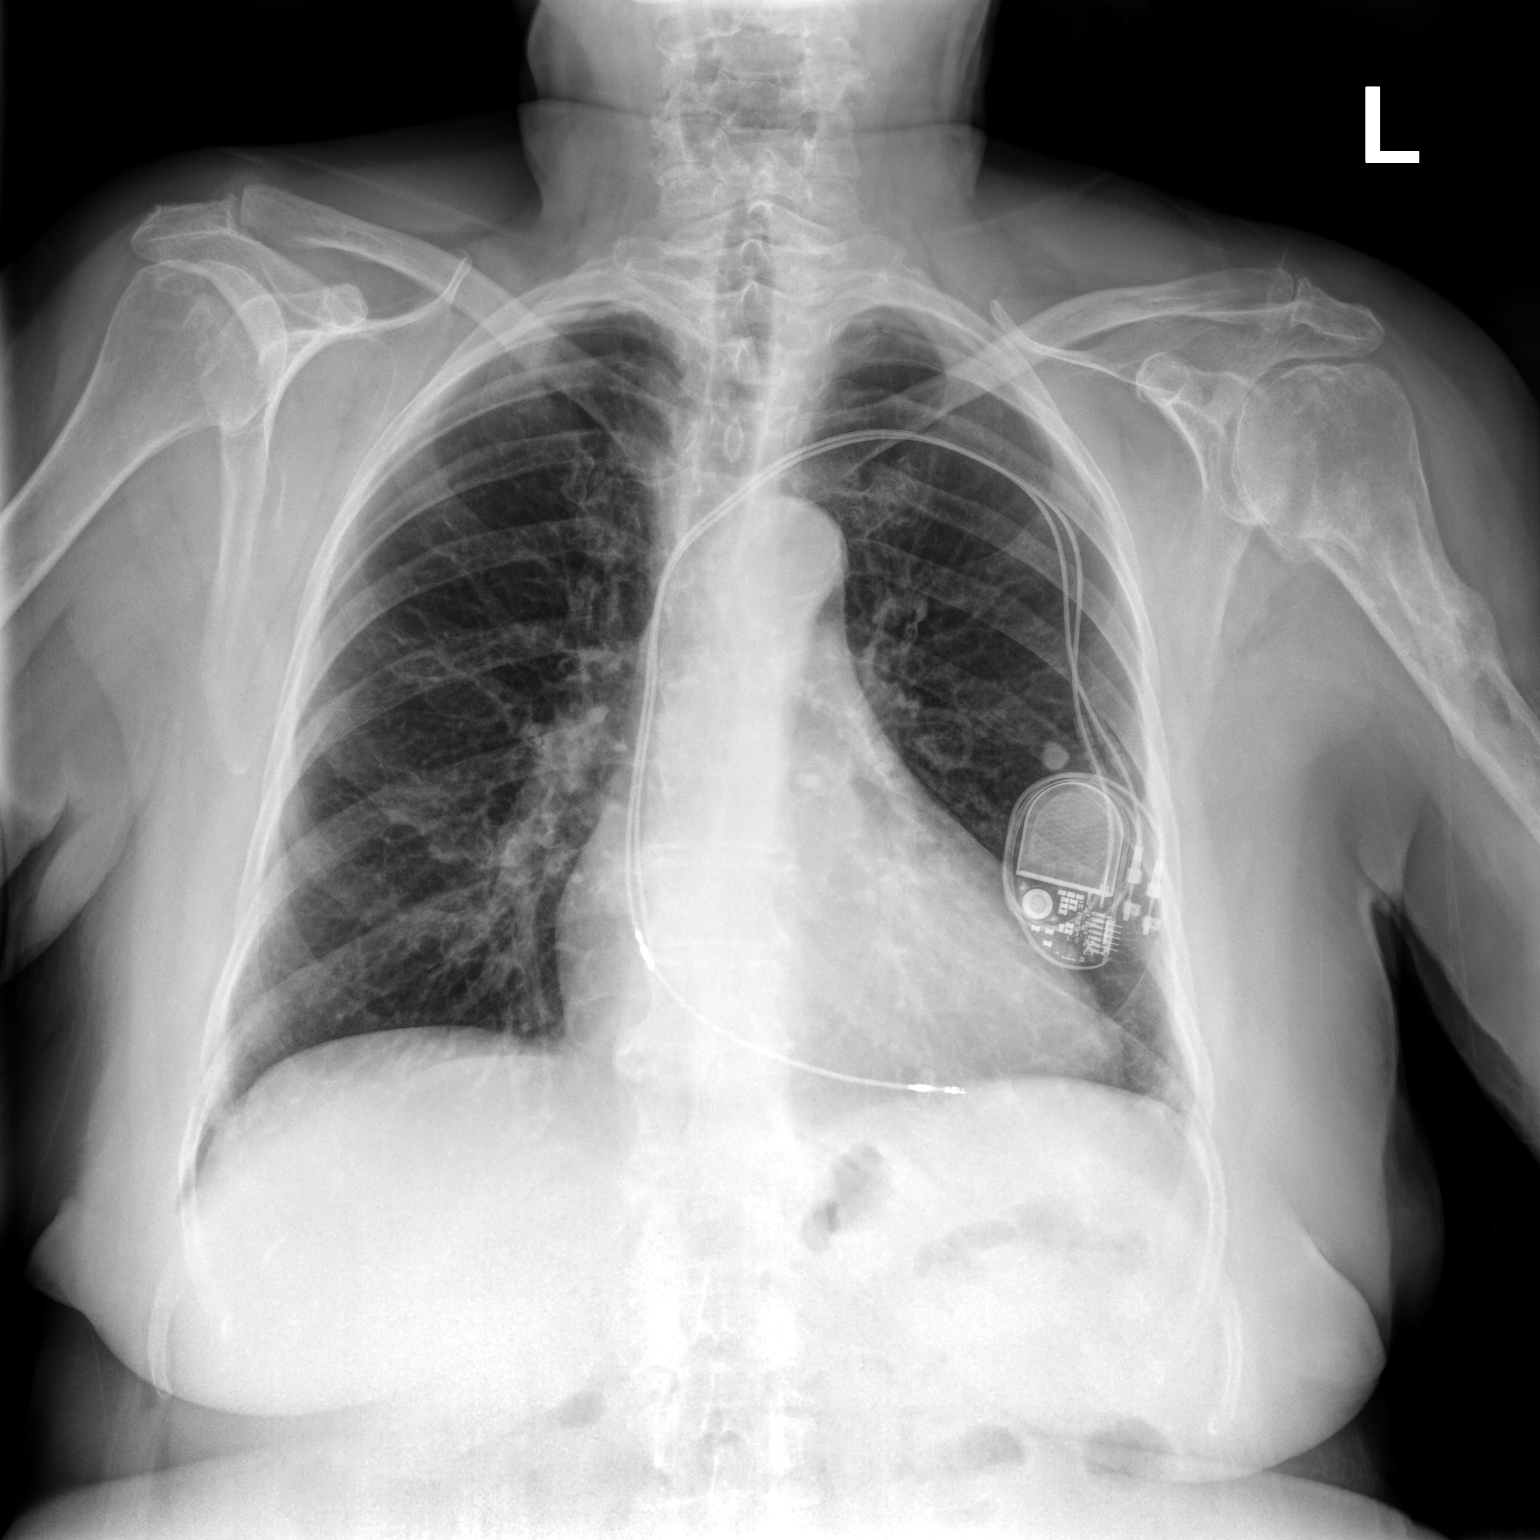

[chest lat]
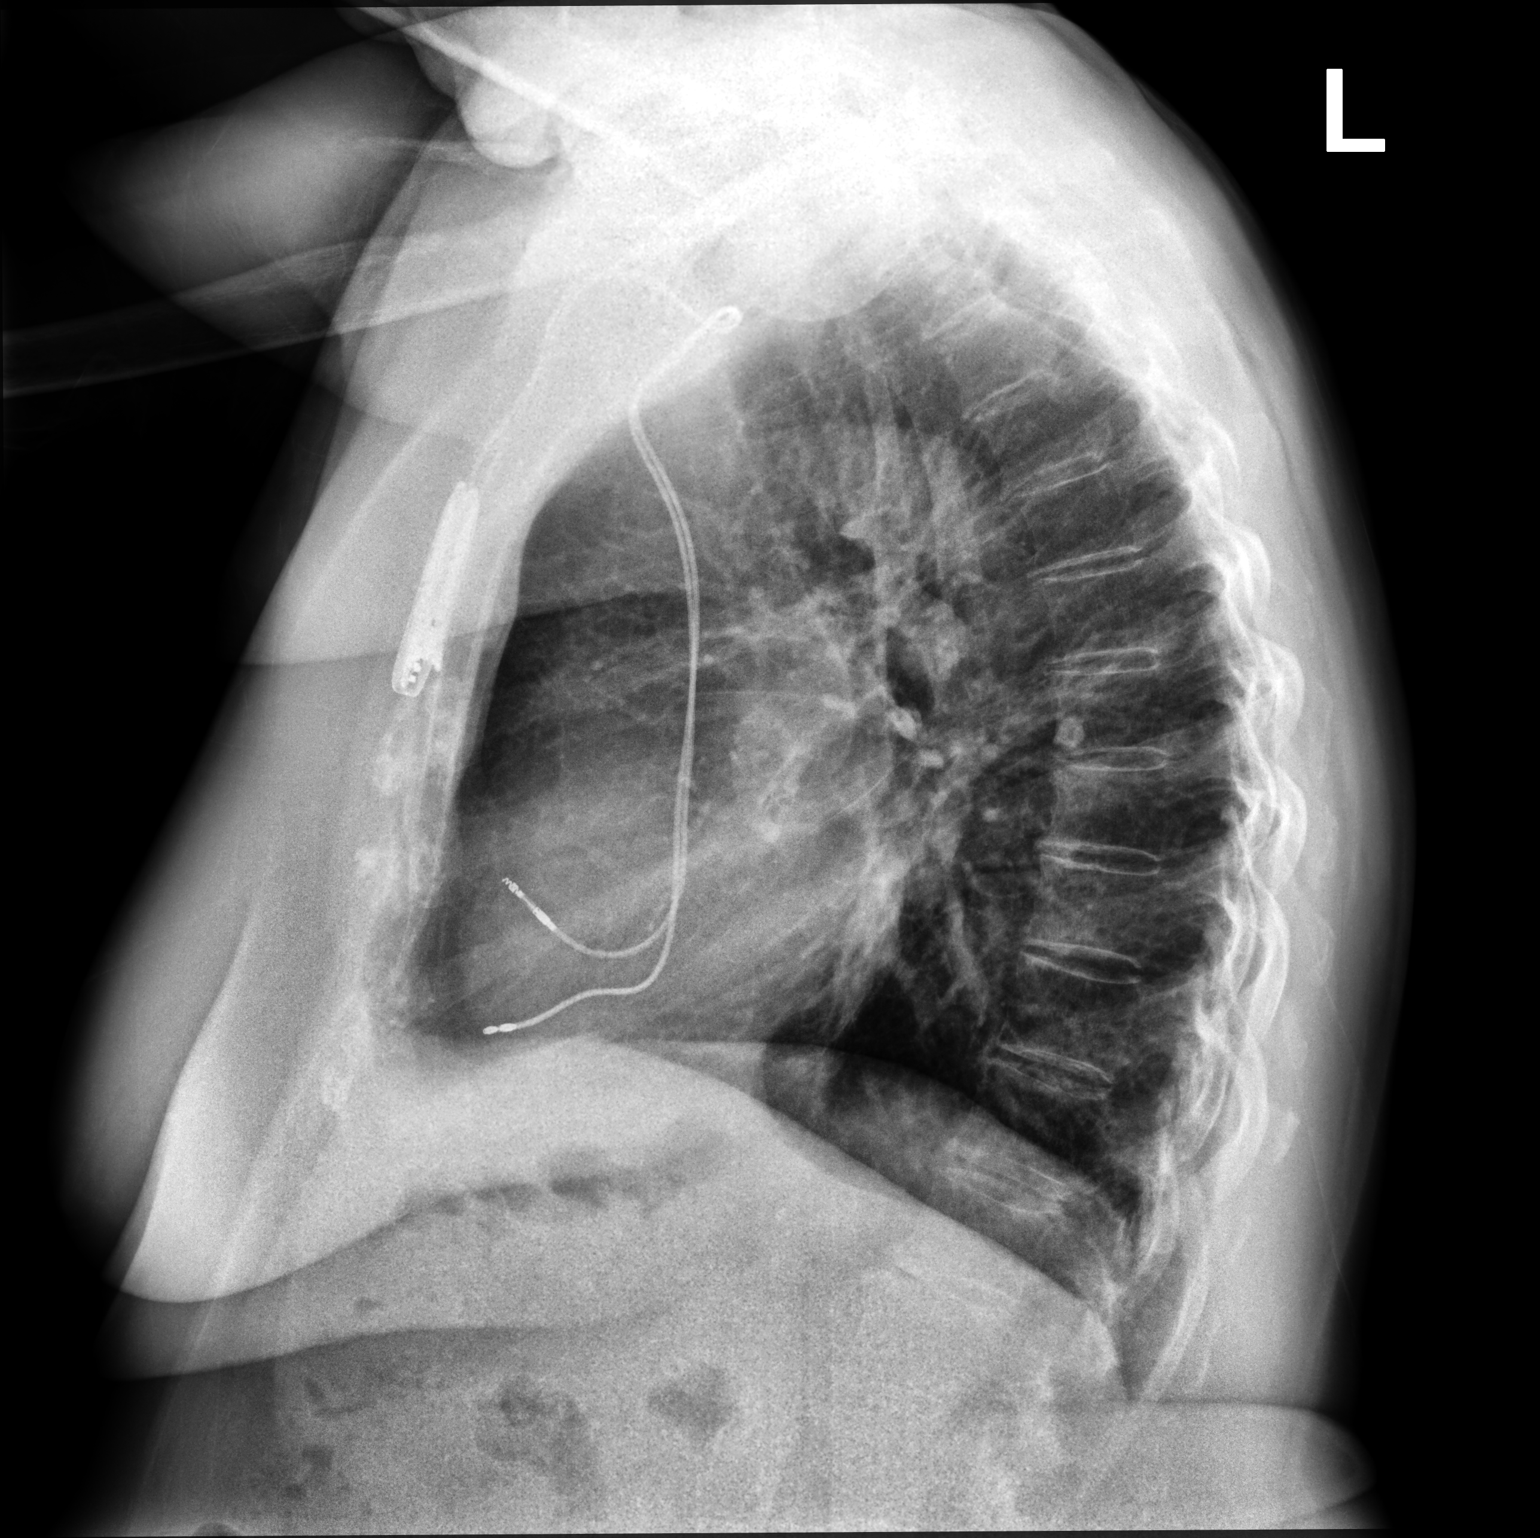

[2 of 2 positions shown; findings below may reference images not displayed]

FINDINGS: LEFT subclavian sequential transvenous pacemaker leads project at
RIGHT atrium and RIGHT ventricle.

Enlargement of cardiac silhouette.

Mediastinal contours and pulmonary vascularity normal.

Atherosclerotic calcification aorta.

Calcified granuloma LEFT mid lung.

No pulmonary infiltrate, pleural effusion, or pneumothorax.

Deformity of the proximal LEFT humerus consistent with healing
fracture.
IMPRESSION: Enlargement of cardiac silhouette post pacemaker.

No acute pulmonary abnormalities.

Healing proximal LEFT humeral fracture.

Aortic Atherosclerosis ([id number]).

## 2020-07-15 DIAGNOSIS — E261 Secondary hyperaldosteronism: Secondary | ICD-10-CM | POA: Diagnosis not present

## 2020-07-15 DIAGNOSIS — I5030 Unspecified diastolic (congestive) heart failure: Secondary | ICD-10-CM | POA: Diagnosis not present

## 2020-07-15 DIAGNOSIS — D692 Other nonthrombocytopenic purpura: Secondary | ICD-10-CM | POA: Diagnosis not present

## 2020-07-15 DIAGNOSIS — R42 Dizziness and giddiness: Secondary | ICD-10-CM | POA: Diagnosis not present

## 2020-07-19 ENCOUNTER — Other Ambulatory Visit (HOSPITAL_COMMUNITY): Payer: Self-pay | Admitting: Physician Assistant

## 2020-08-19 ENCOUNTER — Ambulatory Visit (INDEPENDENT_AMBULATORY_CARE_PROVIDER_SITE_OTHER): Payer: PPO

## 2020-08-19 DIAGNOSIS — I442 Atrioventricular block, complete: Secondary | ICD-10-CM

## 2020-08-22 LAB — CUP PACEART REMOTE DEVICE CHECK
Battery Remaining Longevity: 62 mo
Battery Remaining Percentage: 54 %
Battery Voltage: 2.98 V
Brady Statistic AP VP Percent: 55 %
Brady Statistic AP VS Percent: 1 %
Brady Statistic AS VP Percent: 42 %
Brady Statistic AS VS Percent: 1 %
Brady Statistic RA Percent Paced: 10 %
Brady Statistic RV Percent Paced: 96 %
Date Time Interrogation Session: 20220618153318
Implantable Lead Implant Date: 20180313
Implantable Lead Implant Date: 20180313
Implantable Lead Location: 753859
Implantable Lead Location: 753860
Implantable Pulse Generator Implant Date: 20180313
Lead Channel Impedance Value: 400 Ohm
Lead Channel Impedance Value: 430 Ohm
Lead Channel Pacing Threshold Amplitude: 0.875 V
Lead Channel Pacing Threshold Amplitude: 1.25 V
Lead Channel Pacing Threshold Pulse Width: 0.5 ms
Lead Channel Pacing Threshold Pulse Width: 0.5 ms
Lead Channel Sensing Intrinsic Amplitude: 0.6 mV
Lead Channel Sensing Intrinsic Amplitude: 10.6 mV
Lead Channel Setting Pacing Amplitude: 1.125
Lead Channel Setting Pacing Amplitude: 2.5 V
Lead Channel Setting Pacing Pulse Width: 0.5 ms
Lead Channel Setting Sensing Sensitivity: 4 mV
Pulse Gen Model: 2272
Pulse Gen Serial Number: 8005625

## 2020-09-08 NOTE — Progress Notes (Signed)
Remote pacemaker transmission.   

## 2020-09-15 ENCOUNTER — Other Ambulatory Visit (HOSPITAL_COMMUNITY): Payer: Self-pay | Admitting: Physician Assistant

## 2020-09-16 ENCOUNTER — Telehealth: Payer: Self-pay | Admitting: Cardiology

## 2020-09-16 ENCOUNTER — Other Ambulatory Visit: Payer: Self-pay

## 2020-09-16 MED ORDER — RIVAROXABAN 15 MG PO TABS: 15.0000 mg | ORAL_TABLET | Freq: Every day | ORAL | 5 refills | Status: DC

## 2020-09-16 NOTE — Telephone Encounter (Signed)
Prescription refill request for Xarelto received.  Indication:atrial fib Last office visit:2/22 Weight:65.5 kg Age:87 Scr:1.1 CrCl:33.04 ml/min  Prescription refilled

## 2020-09-16 NOTE — Telephone Encounter (Signed)
*  STAT* If patient is at the pharmacy, call can be transferred to refill team.   1. Which medications need to be refilled? (please list name of each medication and dose if known)  Rivaroxaban (XARELTO) 15 MG TABS tablet  2. Which pharmacy/location (including street and city if local pharmacy) is medication to be sent to? [facility] ([place]), [place] - [address]  3. Do they need a 30 day or 90 day supply?  90 day supply  Patient's daughter states the patient is completely out of medication and they are unable to get in contact with the [facility] for refill. I offered to transfer her, but she declined.

## 2020-09-16 NOTE — Telephone Encounter (Signed)
Prescription refill request for Xarelto received.  Indication:atrial fib Last office visit:2/22 Weight:65.5 kg Age:87 Scr:1.1 CrCl:38.87 ml/min  Prescription refilled

## 2020-09-30 DIAGNOSIS — D692 Other nonthrombocytopenic purpura: Secondary | ICD-10-CM | POA: Diagnosis not present

## 2020-09-30 DIAGNOSIS — E261 Secondary hyperaldosteronism: Secondary | ICD-10-CM | POA: Diagnosis not present

## 2020-09-30 DIAGNOSIS — I5030 Unspecified diastolic (congestive) heart failure: Secondary | ICD-10-CM | POA: Diagnosis not present

## 2020-09-30 DIAGNOSIS — Z6826 Body mass index (BMI) 26.0-26.9, adult: Secondary | ICD-10-CM | POA: Diagnosis not present

## 2020-11-08 ENCOUNTER — Other Ambulatory Visit: Payer: Self-pay | Admitting: Internal Medicine

## 2020-11-08 NOTE — Telephone Encounter (Signed)
Patient should still have one refill left

## 2020-11-15 ENCOUNTER — Telehealth: Payer: Self-pay

## 2020-11-15 ENCOUNTER — Other Ambulatory Visit: Payer: Self-pay

## 2020-11-15 MED ORDER — BISOPROLOL FUMARATE 5 MG PO TABS: 2.5000 mg | ORAL_TABLET | Freq: Two times a day (BID) | ORAL | 3 refills | Status: DC

## 2020-11-15 NOTE — Telephone Encounter (Signed)
I can refill - since she only has a day left I sent it to [facility]

## 2020-11-15 NOTE — Telephone Encounter (Signed)
Please advise as pt is asking for a rx refill for bisoprolol (ZEBETA) 5 MG tablet. Pt states she only has a day worth of medication left and is in need of a refill.

## 2020-11-18 ENCOUNTER — Ambulatory Visit (INDEPENDENT_AMBULATORY_CARE_PROVIDER_SITE_OTHER): Payer: PPO

## 2020-11-18 DIAGNOSIS — I442 Atrioventricular block, complete: Secondary | ICD-10-CM

## 2020-11-21 LAB — CUP PACEART REMOTE DEVICE CHECK
Battery Remaining Longevity: 58 mo
Battery Remaining Percentage: 51 %
Battery Voltage: 2.98 V
Brady Statistic AP VP Percent: 55 %
Brady Statistic AP VS Percent: 1 %
Brady Statistic AS VP Percent: 42 %
Brady Statistic AS VS Percent: 1 %
Brady Statistic RA Percent Paced: 9 %
Brady Statistic RV Percent Paced: 96 %
Date Time Interrogation Session: 20220916081452
Implantable Lead Implant Date: 20180313
Implantable Lead Implant Date: 20180313
Implantable Lead Location: 753859
Implantable Lead Location: 753860
Implantable Pulse Generator Implant Date: 20180313
Lead Channel Impedance Value: 390 Ohm
Lead Channel Impedance Value: 430 Ohm
Lead Channel Pacing Threshold Amplitude: 1 V
Lead Channel Pacing Threshold Amplitude: 1.25 V
Lead Channel Pacing Threshold Pulse Width: 0.5 ms
Lead Channel Pacing Threshold Pulse Width: 0.5 ms
Lead Channel Sensing Intrinsic Amplitude: 0.6 mV
Lead Channel Sensing Intrinsic Amplitude: 7.5 mV
Lead Channel Setting Pacing Amplitude: 1.25 V
Lead Channel Setting Pacing Amplitude: 2.5 V
Lead Channel Setting Pacing Pulse Width: 0.5 ms
Lead Channel Setting Sensing Sensitivity: 4 mV
Pulse Gen Model: 2272
Pulse Gen Serial Number: 8005625

## 2020-11-24 NOTE — Progress Notes (Signed)
Remote pacemaker transmission.   

## 2020-12-12 ENCOUNTER — Telehealth: Payer: Self-pay | Admitting: Internal Medicine

## 2020-12-12 NOTE — Telephone Encounter (Signed)
Left message for patient to call me back at [phone] to schedule Medicare Annual Wellness Visit   Last AWV  09/10/20  Please schedule at anytime with [facility] if patient calls the office back.    40 Minutes appointment   Any questions, please call me at [phone]

## 2021-01-03 NOTE — Progress Notes (Signed)
Subjective:    Patient ID: Beverly Kim, female    DOB: November 06, 1924, 87 y.o.   MRN: [id number]  This visit occurred during the SARS-CoV-2 public health emergency.  Safety protocols were in place, including screening questions prior to the visit, additional usage of staff PPE, and extensive cleaning of exam room while observing appropriate contact time as indicated for disinfecting solutions.     HPI The patient is here for follow up of their chronic medical problems, including Afib, htn, hypothyroidism, DM, neuropathy in feet, CKD, GERD.  She is here alone  She would like to see a foot doctor for her diabetes.  She occasionally will get blurry vision and wondered if it was related to her diabetes.  She denies any obvious pattern to when this occurs.  Does not seem to be related to when she eats or time of day.    Medications and allergies reviewed with patient and updated if appropriate.  Patient Active Problem List   Diagnosis Date Noted   Diabetic neuropathy ([place]) 06/29/2020   CKD (chronic kidney disease) stage 3, GFR 30-59 ml/min (HCC) 06/28/2020   Rib pain on right side    GI bleed 05/31/2020   Cardiac pacemaker 09/17/2019   Preop cardiovascular exam 09/17/2019   Vertigo 09/12/2019   Unsteady gait when walking 09/12/2019   Dyshidrotic eczema 09/12/2019   Nausea 08/05/2019   Black stool 07/31/2019   Fluid overload 07/31/2019   Abdominal pain    Persistent atrial fibrillation CHA2DS2-VASc score =5; Xarelto 15 mg 01/23/2019   Secondary hypercoagulable state ([place]) 01/23/2019   Fatigue 11/25/2018   Poor balance 10/22/2018   Pyelonephritis 07/25/2017   Carotid disease, bilateral ([place]) 04/08/2017   Leg pain 04/08/2017   Non-rheumatic mitral regurgitation 07/12/2016   Complete heart block ([place]) 05/14/2016   Diabetes mellitus without complication ([place]) 78/29/5621   Degenerative disc disease, cervical 08/23/2015   Degenerative disc disease, lumbar 08/23/2015   Left  rotator cuff tear arthropathy 07/12/2015   Left shoulder pain 04/06/2015   Frozen shoulder 01/03/2015   Cough variant asthma 12/08/2013   Upper airway cough syndrome 10/27/2013   Dyspnea 02/03/2013   GERD 07/06/2009   HIATAL HERNIA 07/06/2009   DIVERTICULOSIS, COLON 07/06/2009   HEPATIC CYST 07/06/2009   COLONIC POLYPS, HX OF 07/06/2009   MIXED HEARING LOSS BILATERAL 12/13/2008   Essential hypertension 06/19/2008   MITRAL VALVE PROLAPSE 06/19/2008   LBBB (left bundle branch block) 06/19/2008   Hyperlipidemia 08/12/2007   Hypothyroidism 12/02/2006   CAD (coronary artery disease) 12/02/2006   Chronic diastolic heart failure ([place]) 12/02/2006    Current Outpatient Medications on File Prior to Visit  Medication Sig Dispense Refill   acetaminophen (TYLENOL) 650 MG CR tablet Take 650 mg by mouth every 8 (eight) hours as needed for pain.     albuterol (PROVENTIL HFA;VENTOLIN HFA) 108 (90 Base) MCG/ACT inhaler Inhale 1-2 puffs into the lungs every 6 (six) hours as needed for wheezing or shortness of breath. 1 Inhaler 0   bisoprolol (ZEBETA) 5 MG tablet Take 0.5 tablets (2.5 mg total) by mouth 2 (two) times daily. 90 tablet 3   Fluticasone-Umeclidin-Vilant (TRELEGY ELLIPTA) 100-62.5-25 MCG/INH AEPB Inhale 1 puff into the lungs daily. 60 each 3   furosemide (LASIX) 40 MG tablet Take 1 tablet by mouth once daily (Patient taking differently: Take 40 mg by mouth daily.) 90 tablet 3   Melatonin 5 MG CAPS Take 5 mg by mouth at bedtime as needed (sleep).  metolazone (ZAROXOLYN) 2.5 MG tablet TAKE 1 TABLET BY MOUTH THREE TIMES A WEEK 30 tablet 10   pantoprazole (PROTONIX) 40 MG tablet Take 1 tablet (40 mg total) by mouth daily. 30 tablet 0   potassium chloride (KLOR-CON) 10 MEQ tablet TAKE 1 TABLET BY MOUTH ONCE DAILY ON  DAYS  METOLAZONE  ARE  TAKEN 30 tablet 10   Rivaroxaban (XARELTO) 15 MG TABS tablet Take 1 tablet (15 mg total) by mouth daily with supper. 60 tablet 5   SYNTHROID 88 MCG tablet  Take 1 tab daily 30 minutes prior to breakfast 6 days a week, take 2 tabs 30 minutes prior to breakfast once a week 94 tablet 3   No current facility-administered medications on file prior to visit.    Past Medical History:  Diagnosis Date   Cardiac pacemaker 05/2016   Sick sinus syndrome/complete heart block   Chronic heart failure with preserved ejection fraction (HFpEF) (HCC)    Colon polyps    Diverticulosis    Dyspnea    GERD (gastroesophageal reflux disease)    Hearing loss of both ears    Hepatic cyst    Hiatal hernia    Hyperlipidemia    Hypertension    Hypothyroidism    Low back pain    Non-occlusive coronary artery disease 04/2001   Cardiac Cath 04/18/2001: 50 and 70% mid LAD after D1. = Nonischemic by Myoview.  Medical management.   Other left bundle branch block    PAF (paroxysmal atrial fibrillation) ([place]) 08/16/2016   observed on PPM interrogation, asymptomatic, chad2vasc score is 5.  Bisoprolol for rate control, Xarelto for anticoagulation.   URI (upper respiratory infection)     Past Surgical History:  Procedure Laterality Date   CARDIOVERSION N/A 01/16/2019   Procedure: CARDIOVERSION;  Surgeon: Josue Hector, MD;  Location: [facility] ENDOSCOPY;  Service: Cardiovascular;  Laterality: N/A;   CHOLECYSTECTOMY N/A 12/15/2019   Procedure: LAPAROSCOPIC CHOLECYSTECTOMY;  Surgeon: Stark Klein, MD;  Location: [facility];  Service: General;  Laterality: N/A;   COLONOSCOPY     ERCP N/A 07/21/2019   Procedure: ENDOSCOPIC RETROGRADE CHOLANGIOPANCREATOGRAPHY (ERCP);  Surgeon: Milus Banister, MD;  Location: [facility] ENDOSCOPY;  Service: Endoscopy;  Laterality: N/A;   HERNIA REPAIR     LEFT HEART CATH AND CORONARY ANGIOGRAPHY  04/18/2001   Dr. Lia Foyer: EF 60 to 65%.  2+ MR.  Upper LM takeoff with 90 degree bend just inside the ostium.  50-70% mid LAD after D1.  D1 is 30%.    Distal LAD wraps the apex and is free of disease.  LCx is mostly large OM branch.  RCA provides PDA and PL branches  and Free of disease. -->  Presumably evaluated by Myoview that was nonischemic, therefore no PCI performed.   NM MYOVIEW LTD  01/2012   Normal EF.  Normal study.  No ischemia or infarction.   PACEMAKER IMPLANT Left 05/15/2016   SJM Assirity MRI dual chamber PPM implanted by Dr Rayann Heman for CHB   REMOVAL OF STONES  07/21/2019   Procedure: REMOVAL OF STONES;  Surgeon: Milus Banister, MD;  Location: [facility] ENDOSCOPY;  Service: Endoscopy;;   SPHINCTEROTOMY  07/21/2019   Procedure: [name];  Surgeon: Milus Banister, MD;  Location: [facility] ENDOSCOPY;  Service: Endoscopy;;   THYROIDECTOMY     TONSILLECTOMY AND ADENOIDECTOMY     TOTAL ABDOMINAL HYSTERECTOMY W/ BILATERAL SALPINGOOPHORECTOMY     TRANSTHORACIC ECHOCARDIOGRAM  06/27/2016   Moderate basal-septal LVH, EF 60 to 65%.  No  R WMA.  GR 1 DD.  Mild aortic valve calcification.  No AS.  Trivial MR.--Indicated improved EF compared to prior study in 2015.   TRANSTHORACIC ECHOCARDIOGRAM  12/'13; 11/'15   a) EF 50 and 55%.  Normal function.-Moderate MR.  Mild LA dilation.; b) Echo for shortness of breath => reduced EF of 45 to 50%.  Mild LVH.  Inferior reversible HK.  GRII DD.  Moderate MR.   TRANSTHORACIC ECHOCARDIOGRAM  10/2019   EF 60 -65%.  No RWMA.  Severe concentric LVH.  Unable to assess diastolic function.  Mild biatrial dilation.  Moderate MR moderate TR, no AS    Social History   Socioeconomic History   Marital status: Widowed    Spouse name: Not on file   Number of children: 4   Years of education: Not on file   Highest education level: Not on file  Occupational History   Occupation: retired  Tobacco Use   Smoking status: Never   Smokeless tobacco: Never  Vaping Use   Vaping Use: Never used  Substance and Sexual Activity   Alcohol use: No   Drug use: No   Sexual activity: Never  Other Topics Concern   Not on file  Social History Narrative   Not on file   Social Determinants of Health   Financial Resource Strain: Not on  file  Food Insecurity: Not on file  Transportation Needs: Not on file  Physical Activity: Not on file  Stress: Not on file  Social Connections: Not on file    Family History  Problem Relation Age of Onset   Coronary artery disease Other        family hx of 1st degree relative <50   Diabetes Other        family hx of   Other Other        cardiovascular disorder family hx of   Other Other        neurological disorder family hx of   Other Other        respiratory disease family hx of   Heart attack Daughter    Heart Problems Other        all children   Sudden death Son    Heart disease Brother    Heart disease Sister    Colon cancer Neg Hx    Colon polyps Neg Hx     Review of Systems  Constitutional:  Negative for fever.  Eyes:  Positive for visual disturbance (blurry vision 2-3 times a week).  Respiratory:  Positive for shortness of breath. Negative for cough and wheezing.   Cardiovascular:  Negative for chest pain, palpitations and leg swelling.  Neurological:  Positive for dizziness (when she turns her head). Negative for light-headedness and headaches.      Objective:   Vitals:   01/04/21 1348  BP: 112/80  Pulse: 70  Temp: 98.1 F (36.7 C)  SpO2: 98%   BP Readings from Last 3 Encounters:  01/04/21 112/80  06/29/20 116/68  06/02/20 (!) 109/47   Wt Readings from Last 3 Encounters:  01/04/21 153 lb (69.4 kg)  06/01/20 144 lb 8 oz (65.5 kg)  04/08/20 144 lb (65.3 kg)   Body mass index is 27.98 kg/m.   Physical Exam    Constitutional: Appears well-developed and well-nourished. No distress.  HENT:  Head: Normocephalic and atraumatic.  Neck: Neck supple. No tracheal deviation present. No thyromegaly present.  No cervical lymphadenopathy Cardiovascular: Normal rate, regular rhythm and normal heart  sounds.   No murmur heard. No carotid bruit .  No edema Pulmonary/Chest: Effort normal and breath sounds normal. No respiratory distress. No has no wheezes. No  rales. Abdomen: Soft, nontender Skin: Skin is warm and dry. Not diaphoretic.  Psychiatric: Normal mood and affect. Behavior is normal.      Assessment & Plan:    See Problem List for Assessment and Plan of chronic medical problems.

## 2021-01-04 ENCOUNTER — Telehealth: Payer: Self-pay | Admitting: Internal Medicine

## 2021-01-04 ENCOUNTER — Encounter: Payer: Self-pay | Admitting: Internal Medicine

## 2021-01-04 ENCOUNTER — Ambulatory Visit (INDEPENDENT_AMBULATORY_CARE_PROVIDER_SITE_OTHER): Payer: PPO | Admitting: Internal Medicine

## 2021-01-04 ENCOUNTER — Other Ambulatory Visit: Payer: Self-pay

## 2021-01-04 ENCOUNTER — Telehealth: Payer: Self-pay | Admitting: *Deleted

## 2021-01-04 ENCOUNTER — Telehealth: Payer: Self-pay

## 2021-01-04 VITALS — BP 112/80 | HR 70 | Temp 98.1°F | Ht 62.0 in | Wt 153.0 lb

## 2021-01-04 DIAGNOSIS — E1122 Type 2 diabetes mellitus with diabetic chronic kidney disease: Secondary | ICD-10-CM

## 2021-01-04 DIAGNOSIS — N1831 Chronic kidney disease, stage 3a: Secondary | ICD-10-CM | POA: Diagnosis not present

## 2021-01-04 DIAGNOSIS — I10 Essential (primary) hypertension: Secondary | ICD-10-CM

## 2021-01-04 DIAGNOSIS — I5032 Chronic diastolic (congestive) heart failure: Secondary | ICD-10-CM | POA: Diagnosis not present

## 2021-01-04 DIAGNOSIS — I4819 Other persistent atrial fibrillation: Secondary | ICD-10-CM

## 2021-01-04 DIAGNOSIS — D6869 Other thrombophilia: Secondary | ICD-10-CM | POA: Diagnosis not present

## 2021-01-04 DIAGNOSIS — K219 Gastro-esophageal reflux disease without esophagitis: Secondary | ICD-10-CM | POA: Diagnosis not present

## 2021-01-04 DIAGNOSIS — H538 Other visual disturbances: Secondary | ICD-10-CM | POA: Diagnosis not present

## 2021-01-04 DIAGNOSIS — N183 Chronic kidney disease, stage 3 unspecified: Secondary | ICD-10-CM

## 2021-01-04 DIAGNOSIS — E119 Type 2 diabetes mellitus without complications: Secondary | ICD-10-CM

## 2021-01-04 DIAGNOSIS — E038 Other specified hypothyroidism: Secondary | ICD-10-CM

## 2021-01-04 DIAGNOSIS — E1142 Type 2 diabetes mellitus with diabetic polyneuropathy: Secondary | ICD-10-CM

## 2021-01-04 LAB — CBC WITH DIFFERENTIAL/PLATELET
Basophils Absolute: 0 10*3/uL (ref 0.0–0.1)
Basophils Relative: 0.4 % (ref 0.0–3.0)
Eosinophils Absolute: 0.2 10*3/uL (ref 0.0–0.7)
Eosinophils Relative: 2.9 % (ref 0.0–5.0)
HCT: 41.1 % (ref 36.0–46.0)
Hemoglobin: 13.5 g/dL (ref 12.0–15.0)
Lymphocytes Relative: 24.2 % (ref 12.0–46.0)
Lymphs Abs: 1.8 10*3/uL (ref 0.7–4.0)
MCHC: 32.8 g/dL (ref 30.0–36.0)
MCV: 96.2 fl (ref 78.0–100.0)
Monocytes Absolute: 0.6 10*3/uL (ref 0.1–1.0)
Monocytes Relative: 7.6 % (ref 3.0–12.0)
Neutro Abs: 4.8 10*3/uL (ref 1.4–7.7)
Neutrophils Relative %: 64.9 % (ref 43.0–77.0)
Platelets: 144 10*3/uL — ABNORMAL LOW (ref 150.0–400.0)
RBC: 4.28 Mil/uL (ref 3.87–5.11)
RDW: 14.9 % (ref 11.5–15.5)
WBC: 7.4 10*3/uL (ref 4.0–10.5)

## 2021-01-04 LAB — COMPREHENSIVE METABOLIC PANEL
ALT: 19 U/L (ref 0–35)
AST: 26 U/L (ref 0–37)
Albumin: 4.3 g/dL (ref 3.5–5.2)
Alkaline Phosphatase: 84 U/L (ref 39–117)
BUN: 24 mg/dL — ABNORMAL HIGH (ref 6–23)
CO2: 28 mEq/L (ref 19–32)
Calcium: 9.8 mg/dL (ref 8.4–10.5)
Chloride: 105 mEq/L (ref 96–112)
Creatinine, Ser: 0.99 mg/dL (ref 0.40–1.20)
GFR: 48.89 mL/min — ABNORMAL LOW (ref >60.00)
Glucose, Bld: 102 mg/dL — ABNORMAL HIGH (ref 70–99)
Potassium: 4.4 mEq/L (ref 3.5–5.1)
Sodium: 139 mEq/L (ref 135–145)
Total Bilirubin: 1.2 mg/dL (ref 0.2–1.2)
Total Protein: 7.3 g/dL (ref 6.0–8.3)

## 2021-01-04 LAB — LIPID PANEL
Cholesterol: 144 mg/dL (ref 0–200)
HDL: 43.6 mg/dL (ref >39.00)
LDL Cholesterol: 86 mg/dL (ref 0–99)
NonHDL: 100.71
Total CHOL/HDL Ratio: 3
Triglycerides: 75 mg/dL (ref 0.0–149.0)
VLDL: 15 mg/dL (ref 0.0–40.0)

## 2021-01-04 LAB — HEMOGLOBIN A1C: Hgb A1c MFr Bld: 6.4 % (ref 4.6–6.5)

## 2021-01-04 LAB — TSH: TSH: 11.47 u[IU]/mL — ABNORMAL HIGH (ref 0.35–5.50)

## 2021-01-04 MED ORDER — SYNTHROID 100 MCG PO TABS: 100.0000 ug | ORAL_TABLET | Freq: Every day | ORAL | 1 refills | Status: DC

## 2021-01-04 NOTE — Assessment & Plan Note (Signed)
Chronic Diet controlled Well controlled - a1c 5.9% Check a1c

## 2021-01-04 NOTE — Assessment & Plan Note (Signed)
New Intermittent She is not able to tell me how often this happens or if there is any pattern to when it happens I do not think is related to her sugar since they are very well controlled and she is not on any medication.  She also states that she does eat 3 meals a day ?  Related to some of her cardiac medications/lightheadedness when she takes her diuretics or other meds-advised her to start monitoring and keep track of this.  She will also be moving in with her daughter, which may help to better get a better grip on this. Advised to follow-up with her eye doctor since she has not seen them in a while.

## 2021-01-04 NOTE — Telephone Encounter (Signed)
Patient uses [facility] transportation services  Just call [phone] we will be using our car after today for her rides   Also, make sure patient leaves her cell phone on in case they try to call her

## 2021-01-04 NOTE — Patient Instructions (Addendum)
    Your sugars have been well controlled - we will check your blood work today.     See the orthopedic about your right hand.   ( [facility])   Blood work was ordered.     Medications changes include :   none   A referral was ordered for a podiatrist.         Someone from their office will call you to schedule an appointment.    Please followup in 6 months

## 2021-01-04 NOTE — Assessment & Plan Note (Signed)
Chronic Euvolemic Following with cardiology Continue current medications 

## 2021-01-04 NOTE — Assessment & Plan Note (Signed)
Chronic Stable cmp 

## 2021-01-04 NOTE — Telephone Encounter (Signed)
   Telephone encounter was:  Successful.  01/04/2021 Name: Beverly Kim MRN: [id number] DOB: [birth year]  Beverly Kim is a 87 y.o. year old female who is a primary care patient of Burns, Claudina Lick, MD . The community resource team was consulted for assistance with Transportation Needs Patient called in has an appt today at [facility]  at 2:00 going to call [facility] and see if they can fit her iin coordinator established transportation for patient Short notice and iscalling to schedule the ride with the patient   Care guide performed the following interventions: Patient provided with information about care guide support team and interviewed to confirm resource needs Follow up call placed to community resources to determine status of patients referral.  Follow Up Plan:  No further follow up planned at this time. The patient has been provided with needed resources.

## 2021-01-04 NOTE — Assessment & Plan Note (Signed)
Chronic GERD controlled Continue protonix 40 mg daily  

## 2021-01-04 NOTE — Assessment & Plan Note (Signed)
Chronic BP well controlled Continue zebeta 2.5 mg bid cmp

## 2021-01-04 NOTE — Assessment & Plan Note (Signed)
Chronic Will refer to podiatry Sugars well controlled

## 2021-01-04 NOTE — Assessment & Plan Note (Signed)
Chronic  Clinically euthyroid Currently taking synthroid 88 mcg daily 6 weeks, 176 mcg once a week Check tsh  Titrate med dose if needed

## 2021-01-04 NOTE — Assessment & Plan Note (Signed)
Chronic occ palps On zebeta 2.5 mg bid, xarelto 15 mg daily Cbc, cmp, tsh

## 2021-01-04 NOTE — Assessment & Plan Note (Signed)
Chronic Continue xarelto 15 mg daily

## 2021-01-04 NOTE — Telephone Encounter (Signed)
   SHONTELLE MUSKA DOB: [birth year] MRN: [id number]   RIDER WAIVER AND RELEASE OF LIABILITY  For purposes of improving physical access to our facilities, [facility] is pleased to partner with third parties to provide [facility] patients or other authorized individuals the option of convenient, on-demand ground transportation services (the [place]") through use of the technology service that enables users to request on-demand ground transportation from independent third-party providers.  By opting to use and accept these [employer], I, the undersigned, hereby agree on behalf of myself, and on behalf of any minor child using the Government social research officer for whom I am the parent or legal guardian, as follows:  Government social research officer provided to me are provided by independent third-party transportation providers who are not [employer] or employees and who are unaffiliated with [employer]. [facility] is neither a transportation carrier nor a common or public carrier. [facility] has no control over the quality or safety of the transportation that occurs as a result of the [employer]. [facility] cannot guarantee that any third-party transportation provider will complete any arranged transportation service. [facility] makes no representation, warranty, or guarantee regarding the reliability, timeliness, quality, safety, suitability, or availability of any of the Transport Services or that they will be error free. I fully understand that traveling by vehicle involves risks and dangers of serious bodily injury, including permanent disability, paralysis, and death. I agree, on behalf of myself and on behalf of any minor child using the Transport Services for whom I am the parent or legal guardian, that the entire risk arising out of my use of the [employer] remains solely with me, to the maximum extent permitted under applicable law. The [employer] are provided "as  is" and "as available." [facility] disclaims all representations and warranties, express, implied or statutory, not expressly set out in these terms, including the implied warranties of merchantability and fitness for a particular purpose. I hereby waive and release [facility], its agents, employees, officers, directors, representatives, insurers, attorneys, assigns, successors, subsidiaries, and affiliates from any and all past, present, or future claims, demands, liabilities, actions, causes of action, or suits of any kind directly or indirectly arising from acceptance and use of the [employer]. I further waive and release [facility] and its affiliates from all present and future liability and responsibility for any injury or death to persons or damages to property caused by or related to the use of the [employer]. I have read this Waiver and Release of Liability, and I understand the terms used in it and their legal significance. This Waiver is freely and voluntarily given with the understanding that my right (as well as the right of any minor child for whom I am the parent or legal guardian using the [employer]) to legal recourse against  in connection with the [employer] is knowingly surrendered in return for use of these services.   I attest that I read the consent document to Verdie Shire, gave Ms. Woolen the opportunity to ask questions and answered the questions asked (if any). I affirm that Verdie Shire then provided consent for she's participation in this program.     Drucie Ip

## 2021-01-04 NOTE — Addendum Note (Signed)
Addended by: [name] on: 01/04/2021 08:27 PM   Modules accepted: Orders

## 2021-01-06 ENCOUNTER — Telehealth: Payer: Self-pay | Admitting: Internal Medicine

## 2021-01-06 NOTE — Telephone Encounter (Signed)
Patient requesting a call back to discuss lab results

## 2021-01-09 NOTE — Telephone Encounter (Signed)
Lab results given and mailed out on Friday.

## 2021-01-25 ENCOUNTER — Ambulatory Visit: Payer: Self-pay | Admitting: Podiatry

## 2021-01-27 ENCOUNTER — Other Ambulatory Visit: Payer: Self-pay | Admitting: Cardiology

## 2021-02-07 ENCOUNTER — Ambulatory Visit: Payer: PPO | Admitting: Podiatry

## 2021-02-14 ENCOUNTER — Encounter: Payer: Self-pay | Admitting: Podiatry

## 2021-02-14 ENCOUNTER — Other Ambulatory Visit: Payer: Self-pay

## 2021-02-14 ENCOUNTER — Ambulatory Visit: Payer: PPO | Admitting: Podiatry

## 2021-02-14 DIAGNOSIS — B351 Tinea unguium: Secondary | ICD-10-CM

## 2021-02-14 DIAGNOSIS — M79674 Pain in right toe(s): Secondary | ICD-10-CM | POA: Diagnosis not present

## 2021-02-14 DIAGNOSIS — M79675 Pain in left toe(s): Secondary | ICD-10-CM

## 2021-02-14 NOTE — Progress Notes (Signed)
°  Subjective:  Patient ID: Beverly Kim, female    DOB: Jun 04, 1924,  MRN: [id number]  Chief Complaint  Patient presents with   Diabetes    NP  - Diabetes mellitus without complication [facility]) Referring Provider: BURNS, STACY J    Peripheral Neuropathy    Diabetic polyneuropathy    87 y.o. female presents with the above complaint. History confirmed with patient. Nails are thickened and elongated and causing pain, she has difficulty cutting them   Objective:  Physical Exam: warm, good capillary refill, no trophic changes or ulcerative lesions, normal DP and PT pulses, and normal sensory exam. Left Foot: dystrophic yellowed discolored nail plates with subungual debris Right Foot: dystrophic yellowed discolored nail plates with subungual debris   Assessment:   1. Pain due to onychomycosis of toenails of both feet      Plan:  Patient was evaluated and treated and all questions answered.  Discussed the etiology and treatment options for the condition in detail with the patient. Educated patient on the topical and oral treatment options for mycotic nails. Recommended debridement of the nails today. Sharp and mechanical debridement performed of all painful and mycotic nails today. Nails debrided in length and thickness using a nail nipper to level of comfort. Discussed treatment options including appropriate shoe gear. Follow up as needed for painful nails.   Return in about 3 months (around 05/15/2021) for at risk diabetic foot care.

## 2021-02-17 ENCOUNTER — Other Ambulatory Visit: Payer: Self-pay | Admitting: Cardiology

## 2021-03-01 ENCOUNTER — Telehealth: Payer: Self-pay | Admitting: Cardiology

## 2021-03-01 NOTE — Telephone Encounter (Signed)
Pt c/o medication issue:  1. Name of Medication: Rivaroxaban (XARELTO) 15 MG TABS tablet  2. How are you currently taking this medication (dosage and times per day)?   Take 1 tablet (15 mg total) by mouth daily with supper.  3. Are you having a reaction (difficulty breathing--STAT)? yes  4. What is your medication issue? abdominal pain & teeth aches

## 2021-03-01 NOTE — Telephone Encounter (Signed)
Called patient, but she was unable to hear me very well. I then called daughter Bonna Gains and left a voicemail for her to call our clinic.

## 2021-03-07 ENCOUNTER — Encounter: Payer: Self-pay | Admitting: Cardiology

## 2021-03-07 NOTE — Telephone Encounter (Signed)
Spoke to patient's daughter Mickel Baas.She stated mother cannot take Xarelto.Stated since she has been taking her arthritis is worse.Teeth are loose.Pain in upper abdomen under right rib area.She would like to try Coumadin.Advised I will send message to [facility] for a order.

## 2021-03-08 NOTE — Telephone Encounter (Signed)
Relatively unusual symptoms with Xarelto.  I will forward to our Coumadin Clinic pharmacists  to see if we can get her started on Warfarin.   Glenetta Hew, MD

## 2021-03-09 ENCOUNTER — Telehealth: Payer: Self-pay

## 2021-03-09 ENCOUNTER — Other Ambulatory Visit: Payer: Self-pay

## 2021-03-09 MED ORDER — WARFARIN SODIUM 2.5 MG PO TABS: ORAL_TABLET | ORAL | 1 refills | Status: DC

## 2021-03-09 NOTE — Telephone Encounter (Signed)
Patient 's daughter was contact to schedule ---coumadin clinic visit 03/13/21

## 2021-03-09 NOTE — Telephone Encounter (Signed)
I spoke to patient's daughter and started pt on Coumadin and scheduled New Coumadin appointment 1/9.

## 2021-03-10 ENCOUNTER — Other Ambulatory Visit: Payer: Self-pay

## 2021-03-10 ENCOUNTER — Telehealth: Payer: Self-pay

## 2021-03-10 ENCOUNTER — Encounter: Payer: Self-pay | Admitting: Cardiology

## 2021-03-10 DIAGNOSIS — I4819 Other persistent atrial fibrillation: Secondary | ICD-10-CM

## 2021-03-10 NOTE — Telephone Encounter (Signed)
I spoke to the patient's daughter Mickel Baas) and gave her further Coumadin instructions.  She verbalized understanding

## 2021-03-13 ENCOUNTER — Ambulatory Visit (INDEPENDENT_AMBULATORY_CARE_PROVIDER_SITE_OTHER): Payer: PPO

## 2021-03-13 ENCOUNTER — Other Ambulatory Visit: Payer: Self-pay

## 2021-03-13 ENCOUNTER — Encounter: Payer: Self-pay | Admitting: Internal Medicine

## 2021-03-13 DIAGNOSIS — Z7901 Long term (current) use of anticoagulants: Secondary | ICD-10-CM | POA: Diagnosis not present

## 2021-03-13 DIAGNOSIS — I442 Atrioventricular block, complete: Secondary | ICD-10-CM

## 2021-03-13 LAB — POCT INR: INR: 1.1 — AB (ref 2.0–3.0)

## 2021-03-13 NOTE — Patient Instructions (Signed)
Take 2 tablets tonight only and then continue 1 tablet Daily.  INR in 1 week.  A full discussion of the nature of anticoagulants has been carried out.  A benefit risk analysis has been presented to the patient, so that they understand the justification for choosing anticoagulation at this time. The need for frequent and regular monitoring, precise dosage adjustment and compliance is stressed.  Side effects of potential bleeding are discussed.  The patient should avoid any OTC items containing aspirin or ibuprofen, and should avoid great swings in general diet.  Avoid alcohol consumption.  Call if any signs of abnormal bleeding.  [phone]

## 2021-03-14 LAB — CUP PACEART REMOTE DEVICE CHECK
Battery Remaining Longevity: 54 mo
Battery Remaining Percentage: 48 %
Battery Voltage: 2.98 V
Brady Statistic AP VP Percent: 55 %
Brady Statistic AP VS Percent: 1 %
Brady Statistic AS VP Percent: 42 %
Brady Statistic AS VS Percent: 1 %
Brady Statistic RA Percent Paced: 7.7 %
Brady Statistic RV Percent Paced: 96 %
Date Time Interrogation Session: 20230108001234
Implantable Lead Implant Date: 20180313
Implantable Lead Implant Date: 20180313
Implantable Lead Location: 753859
Implantable Lead Location: 753860
Implantable Pulse Generator Implant Date: 20180313
Lead Channel Impedance Value: 400 Ohm
Lead Channel Impedance Value: 430 Ohm
Lead Channel Pacing Threshold Amplitude: 1 V
Lead Channel Pacing Threshold Amplitude: 1.25 V
Lead Channel Pacing Threshold Pulse Width: 0.5 ms
Lead Channel Pacing Threshold Pulse Width: 0.5 ms
Lead Channel Sensing Intrinsic Amplitude: 0.6 mV
Lead Channel Sensing Intrinsic Amplitude: 6.5 mV
Lead Channel Setting Pacing Amplitude: 1.25 V
Lead Channel Setting Pacing Amplitude: 2.5 V
Lead Channel Setting Pacing Pulse Width: 0.5 ms
Lead Channel Setting Sensing Sensitivity: 4 mV
Pulse Gen Model: 2272
Pulse Gen Serial Number: 8005625

## 2021-03-15 DIAGNOSIS — Z7901 Long term (current) use of anticoagulants: Secondary | ICD-10-CM | POA: Diagnosis not present

## 2021-03-15 DIAGNOSIS — I5030 Unspecified diastolic (congestive) heart failure: Secondary | ICD-10-CM | POA: Diagnosis not present

## 2021-03-15 DIAGNOSIS — N183 Chronic kidney disease, stage 3 unspecified: Secondary | ICD-10-CM | POA: Diagnosis not present

## 2021-03-15 DIAGNOSIS — E1122 Type 2 diabetes mellitus with diabetic chronic kidney disease: Secondary | ICD-10-CM | POA: Diagnosis not present

## 2021-03-15 DIAGNOSIS — I480 Paroxysmal atrial fibrillation: Secondary | ICD-10-CM | POA: Diagnosis not present

## 2021-03-20 ENCOUNTER — Ambulatory Visit (INDEPENDENT_AMBULATORY_CARE_PROVIDER_SITE_OTHER): Payer: PPO

## 2021-03-20 ENCOUNTER — Other Ambulatory Visit: Payer: Self-pay

## 2021-03-20 DIAGNOSIS — I4819 Other persistent atrial fibrillation: Secondary | ICD-10-CM | POA: Diagnosis not present

## 2021-03-20 DIAGNOSIS — Z7901 Long term (current) use of anticoagulants: Secondary | ICD-10-CM

## 2021-03-20 LAB — POCT INR: INR: 1.1 — AB (ref 2.0–3.0)

## 2021-03-20 NOTE — Patient Instructions (Signed)
Take 2 tablets tonight only and then increase to 1 tablet Daily, except 1.5 tablets Monday, Wednesday and Friday.  INR in 1 week.  [phone]

## 2021-03-20 NOTE — Telephone Encounter (Signed)
See patient advise request.

## 2021-03-22 NOTE — Progress Notes (Signed)
Remote pacemaker transmission.   

## 2021-03-27 ENCOUNTER — Ambulatory Visit (INDEPENDENT_AMBULATORY_CARE_PROVIDER_SITE_OTHER): Payer: PPO

## 2021-03-27 ENCOUNTER — Other Ambulatory Visit: Payer: Self-pay

## 2021-03-27 DIAGNOSIS — Z7901 Long term (current) use of anticoagulants: Secondary | ICD-10-CM | POA: Diagnosis not present

## 2021-03-27 DIAGNOSIS — I4819 Other persistent atrial fibrillation: Secondary | ICD-10-CM | POA: Diagnosis not present

## 2021-03-27 LAB — POCT INR: INR: 1.2 — AB (ref 2.0–3.0)

## 2021-03-27 MED ORDER — WARFARIN SODIUM 2.5 MG PO TABS: ORAL_TABLET | ORAL | 1 refills | Status: DC

## 2021-03-27 NOTE — Patient Instructions (Signed)
Take 2 tablets tonight and Tuesday night only and then increase to 1.5  tablets Daily, except 1 tablet Tuesday and Thursday.  INR in 2 weeks.  Left message with daughter Mickel Baas)  [phone]

## 2021-04-08 ENCOUNTER — Other Ambulatory Visit: Payer: Self-pay | Admitting: Cardiology

## 2021-04-10 ENCOUNTER — Other Ambulatory Visit: Payer: Self-pay

## 2021-04-10 ENCOUNTER — Ambulatory Visit (INDEPENDENT_AMBULATORY_CARE_PROVIDER_SITE_OTHER): Payer: PPO

## 2021-04-10 DIAGNOSIS — I4819 Other persistent atrial fibrillation: Secondary | ICD-10-CM | POA: Diagnosis not present

## 2021-04-10 DIAGNOSIS — Z7901 Long term (current) use of anticoagulants: Secondary | ICD-10-CM | POA: Diagnosis not present

## 2021-04-10 LAB — POCT INR: INR: 1.2 — AB (ref 2.0–3.0)

## 2021-04-10 MED ORDER — WARFARIN SODIUM 2.5 MG PO TABS: ORAL_TABLET | ORAL | 1 refills | Status: DC

## 2021-04-10 NOTE — Patient Instructions (Signed)
Take 2 tablets tonight and Tuesday night only and then increase to 1.5  tablets Daily.  INR in 2 weeks.  Left message with daughter Mickel Baas)  [phone]

## 2021-04-19 ENCOUNTER — Other Ambulatory Visit: Payer: Self-pay

## 2021-04-19 MED ORDER — WARFARIN SODIUM 2.5 MG PO TABS: ORAL_TABLET | ORAL | 1 refills | Status: DC

## 2021-04-25 ENCOUNTER — Other Ambulatory Visit: Payer: Self-pay

## 2021-04-25 ENCOUNTER — Ambulatory Visit (INDEPENDENT_AMBULATORY_CARE_PROVIDER_SITE_OTHER): Payer: PPO | Admitting: *Deleted

## 2021-04-25 DIAGNOSIS — I4819 Other persistent atrial fibrillation: Secondary | ICD-10-CM | POA: Diagnosis not present

## 2021-04-25 LAB — POCT INR: INR: 1.3 — AB (ref 2.0–3.0)

## 2021-04-25 NOTE — Patient Instructions (Signed)
Description    Take 2 tablets of warfarin today and then START taking warfarin 1.5 tablets daily except for 2 tablets on Mondays and Fridays. Recheck INR in 1 week. Coumadin Clinic [phone]

## 2021-05-02 ENCOUNTER — Other Ambulatory Visit: Payer: Self-pay | Admitting: *Deleted

## 2021-05-02 ENCOUNTER — Telehealth: Payer: Self-pay | Admitting: *Deleted

## 2021-05-02 DIAGNOSIS — Z7901 Long term (current) use of anticoagulants: Secondary | ICD-10-CM

## 2021-05-02 NOTE — Telephone Encounter (Addendum)
Received request for pt to have INR checked by [facility]. Pt in [place].   Standing order placed for pt to get pt/inr drawn by [facility]. Faxed order to the number requested [id number].   Called and spoke to pt's daughter Beverly Kim. Made them aware order  lab INR had been faxed and placed. Beverly Kim, stated that they would try to go today to have INR drawn. If not today sometime this week.

## 2021-05-03 ENCOUNTER — Ambulatory Visit (INDEPENDENT_AMBULATORY_CARE_PROVIDER_SITE_OTHER): Payer: PPO | Admitting: Cardiovascular Disease

## 2021-05-03 DIAGNOSIS — Z7901 Long term (current) use of anticoagulants: Secondary | ICD-10-CM | POA: Diagnosis not present

## 2021-05-03 LAB — PROTIME-INR
INR: 1.4 — ABNORMAL HIGH (ref 0.9–1.2)
Prothrombin Time: 14.1 s — ABNORMAL HIGH (ref 9.1–12.0)

## 2021-05-08 ENCOUNTER — Telehealth: Payer: Self-pay

## 2021-05-08 ENCOUNTER — Other Ambulatory Visit: Payer: Self-pay

## 2021-05-08 DIAGNOSIS — Z7901 Long term (current) use of anticoagulants: Secondary | ICD-10-CM

## 2021-05-08 NOTE — Telephone Encounter (Signed)
I spoke to the patient's daughter Mickel Baas) and they are still in [place] through the week.  She will get her INR done at [facility], while there. ?

## 2021-05-10 DIAGNOSIS — Z7901 Long term (current) use of anticoagulants: Secondary | ICD-10-CM | POA: Diagnosis not present

## 2021-05-11 ENCOUNTER — Ambulatory Visit (INDEPENDENT_AMBULATORY_CARE_PROVIDER_SITE_OTHER): Payer: PPO | Admitting: Internal Medicine

## 2021-05-11 DIAGNOSIS — Z7901 Long term (current) use of anticoagulants: Secondary | ICD-10-CM | POA: Diagnosis not present

## 2021-05-11 LAB — PROTIME-INR
INR: 1.8 — ABNORMAL HIGH (ref 0.9–1.2)
Prothrombin Time: 18.3 s — ABNORMAL HIGH (ref 9.1–12.0)

## 2021-05-12 ENCOUNTER — Telehealth: Payer: Self-pay | Admitting: Internal Medicine

## 2021-05-12 ENCOUNTER — Encounter: Payer: Self-pay | Admitting: Internal Medicine

## 2021-05-12 ENCOUNTER — Other Ambulatory Visit: Payer: Self-pay

## 2021-05-12 MED ORDER — ALBUTEROL SULFATE HFA 108 (90 BASE) MCG/ACT IN AERS
1.0000 | INHALATION_SPRAY | Freq: Four times a day (QID) | RESPIRATORY_TRACT | 2 refills | Status: DC | PRN
Start: 2021-05-12 — End: 2022-05-07

## 2021-05-12 NOTE — Telephone Encounter (Signed)
Sent in today 

## 2021-05-12 NOTE — Telephone Encounter (Signed)
1.Medication Requested: albuterol (PROVENTIL HFA;VENTOLIN HFA) 108 (90 Base) MCG/ACT inhaler ? ?2. Pharmacy (Name, Street, [place]): [id number] [place] ? ?3. On Med List: yes  ? ?4. Last Visit with PCP: 01-04-2021 ? ?5. Next visit date with PCP: n/a ? ?Provider is not prescribing provider, pts daughter requesting a new rx ?

## 2021-05-13 ENCOUNTER — Other Ambulatory Visit: Payer: Self-pay | Admitting: Cardiology

## 2021-05-15 ENCOUNTER — Other Ambulatory Visit: Payer: Self-pay | Admitting: Cardiology

## 2021-05-16 ENCOUNTER — Other Ambulatory Visit: Payer: Self-pay

## 2021-05-16 ENCOUNTER — Ambulatory Visit (INDEPENDENT_AMBULATORY_CARE_PROVIDER_SITE_OTHER): Payer: PPO | Admitting: *Deleted

## 2021-05-16 DIAGNOSIS — Z5181 Encounter for therapeutic drug level monitoring: Secondary | ICD-10-CM

## 2021-05-16 DIAGNOSIS — I4819 Other persistent atrial fibrillation: Secondary | ICD-10-CM

## 2021-05-16 LAB — POCT INR: INR: 2.1 (ref 2.0–3.0)

## 2021-05-16 NOTE — Patient Instructions (Addendum)
Description   ?Continue taking warfarin 2 tablets daily except for 1.5 tablets on Wednesday. Recheck INR in  1wk in [place]. Coumadin Clinic [phone] ? ?  ?  ?

## 2021-05-18 ENCOUNTER — Encounter: Payer: Self-pay | Admitting: Internal Medicine

## 2021-05-24 ENCOUNTER — Ambulatory Visit: Payer: PPO | Admitting: Podiatry

## 2021-05-25 ENCOUNTER — Other Ambulatory Visit: Payer: Self-pay

## 2021-05-25 ENCOUNTER — Telehealth: Payer: Self-pay

## 2021-05-25 DIAGNOSIS — Z7901 Long term (current) use of anticoagulants: Secondary | ICD-10-CM | POA: Diagnosis not present

## 2021-05-25 NOTE — Telephone Encounter (Signed)
I spoke to the patient's daughter and they will have INR drawn today, 3/23. ?

## 2021-05-26 ENCOUNTER — Ambulatory Visit (INDEPENDENT_AMBULATORY_CARE_PROVIDER_SITE_OTHER): Payer: PPO | Admitting: Cardiovascular Disease

## 2021-05-26 DIAGNOSIS — Z7901 Long term (current) use of anticoagulants: Secondary | ICD-10-CM

## 2021-05-26 LAB — PROTIME-INR
INR: 1.4 — ABNORMAL HIGH (ref 0.9–1.2)
Prothrombin Time: 14.6 s — ABNORMAL HIGH (ref 9.1–12.0)

## 2021-06-01 ENCOUNTER — Encounter: Payer: Self-pay | Admitting: Internal Medicine

## 2021-06-01 ENCOUNTER — Other Ambulatory Visit: Payer: Self-pay | Admitting: Internal Medicine

## 2021-06-02 ENCOUNTER — Encounter: Payer: Self-pay | Admitting: Physician Assistant

## 2021-06-02 DIAGNOSIS — R1011 Right upper quadrant pain: Secondary | ICD-10-CM | POA: Diagnosis not present

## 2021-06-02 DIAGNOSIS — N390 Urinary tract infection, site not specified: Secondary | ICD-10-CM | POA: Diagnosis not present

## 2021-06-02 DIAGNOSIS — R110 Nausea: Secondary | ICD-10-CM | POA: Diagnosis not present

## 2021-06-02 DIAGNOSIS — D692 Other nonthrombocytopenic purpura: Secondary | ICD-10-CM | POA: Diagnosis not present

## 2021-06-02 DIAGNOSIS — B3789 Other sites of candidiasis: Secondary | ICD-10-CM | POA: Diagnosis not present

## 2021-06-02 DIAGNOSIS — Z9049 Acquired absence of other specified parts of digestive tract: Secondary | ICD-10-CM | POA: Diagnosis not present

## 2021-06-05 ENCOUNTER — Ambulatory Visit: Payer: PPO | Admitting: Cardiology

## 2021-06-05 ENCOUNTER — Ambulatory Visit (INDEPENDENT_AMBULATORY_CARE_PROVIDER_SITE_OTHER): Payer: PPO

## 2021-06-05 ENCOUNTER — Encounter: Payer: Self-pay | Admitting: Cardiology

## 2021-06-05 VITALS — BP 120/76 | HR 71 | Ht 62.5 in | Wt 151.6 lb

## 2021-06-05 DIAGNOSIS — R0609 Other forms of dyspnea: Secondary | ICD-10-CM | POA: Diagnosis not present

## 2021-06-05 DIAGNOSIS — I10 Essential (primary) hypertension: Secondary | ICD-10-CM

## 2021-06-05 DIAGNOSIS — Z950 Presence of cardiac pacemaker: Secondary | ICD-10-CM | POA: Diagnosis not present

## 2021-06-05 DIAGNOSIS — I2510 Atherosclerotic heart disease of native coronary artery without angina pectoris: Secondary | ICD-10-CM

## 2021-06-05 DIAGNOSIS — I5032 Chronic diastolic (congestive) heart failure: Secondary | ICD-10-CM

## 2021-06-05 DIAGNOSIS — I4819 Other persistent atrial fibrillation: Secondary | ICD-10-CM | POA: Diagnosis not present

## 2021-06-05 DIAGNOSIS — I480 Paroxysmal atrial fibrillation: Secondary | ICD-10-CM

## 2021-06-05 DIAGNOSIS — I340 Nonrheumatic mitral (valve) insufficiency: Secondary | ICD-10-CM | POA: Diagnosis not present

## 2021-06-05 DIAGNOSIS — I4891 Unspecified atrial fibrillation: Secondary | ICD-10-CM

## 2021-06-05 DIAGNOSIS — D6869 Other thrombophilia: Secondary | ICD-10-CM | POA: Diagnosis not present

## 2021-06-05 DIAGNOSIS — Z7901 Long term (current) use of anticoagulants: Secondary | ICD-10-CM

## 2021-06-05 LAB — POCT INR: INR: 2.4 (ref 2.0–3.0)

## 2021-06-05 NOTE — Patient Instructions (Signed)
Increase 2 tablets daily. Recheck INR in 4 weeks. Coumadin Clinic [phone] ?

## 2021-06-05 NOTE — Patient Instructions (Addendum)
Medication Instructions:  ? FOLLOW THESE INSTRUCTION  UNTIL  CONTACT YOU ABOUT LABS OR THE NEXT OFFICE VISIT  ? ? ?TAKE METOLAZONE 2.5 mg   30 MIN BEFORE YOUR LASIX  40 mg ON THESE DAYS( TUE , THURS , SAT)  ? ON Monday, Wednesday Friday , Sunday  Lasix   80 mg  ( 2 tablets of 40 mg)  ? ?  Weigh yourself everyday first thing in the morning.  ?  Once you get to a weight were you fell better meaning you do not have nay issue with swelling and breathing is better. This is what we would  call your dry weight. ? This is the weight your should be maintain. ? ? ?*If you need a refill on your cardiac medications before your next appointment, please call your pharmacy* ? ? ?Lab Work: ?CMP AND BNP TODAY ? ?CMP AND BNP IN 7 DAYS ? ?If you have labs (blood work) drawn today and your tests are completely normal, you will receive your results only by: ?MyChart Message (if you have MyChart) OR ?A paper copy in the mail ?If you have any lab test that is abnormal or we need to change your treatment, we will call you to review the results. ? ? ?Testing/Procedures: ? ?NOT NEEDED ? ?Follow-Up: ?At [facility], you and your health needs are our priority.  As part of our continuing mission to provide you with exceptional heart care, we have created designated Provider Care Teams.  These Care Teams include your primary Cardiologist (physician) and Advanced Practice Providers (APPs -  Physician Assistants and Nurse Practitioners) who all work together to provide you with the care you need, when you need it. ? ?  ? ?Your next appointment:   ?2 week(s) ? ?The format for your next appointment:   ?In Person ? ?Provider:   ?Glenetta Hew, MD  or Caron Presume, PA-C, Jory Sims, DNP, ANP, Almyra Deforest, PA-C, or Diona Browner, NP        . ? ? ?Other Instructions  ? WE WILL HAVE THE DEVICE CLINIC INTERROGATE YOUR PACER- AFIB BURDEN ? ? SLEEP IN THE RECLINER  WITH YOUR LEGS ELEVATED. ? ?

## 2021-06-05 NOTE — Progress Notes (Signed)
? ? ?Primary Care Provider: [name], MD ?Cardiologist: Glenetta Hew, MD ?Electrophysiologist: None ? ?Clinic Note: ?Chief Complaint  ?Patient presents with  ? Follow-up  ? Shortness of Breath  ?  Having hard to catch her breath with lying down, sitting up forward or walking.  ? Congestive Heart Failure  ?  History of HFpEF  ? Atrial Fibrillation  ?  Recently converted to warfarin  ?None Home none ?=================================== ? ?ASSESSMENT/PLAN  ? ?Problem List Items Addressed This Visit   ? ?  ? Cardiology Problems  ? Paroxysmal atrial fibrillation (HCC) (Chronic)  ?  Chronic issue.  I think she may be in A-fib now which could be exacerbating her symptoms.  Difficult to tell on EKG. ? ?Plan: Interrogate PPM ?Continue bisoprolol for rate control and now on warfarin for [place] per her request. ?She has previously declined antiarrhythmics or cardioversion.  Perhaps if she is having an exacerbation now we may build to try to do something to get her out of it-potentially use amiodarone short-term. ?  ?  ? Relevant Orders  ? EKG 12-Lead  ? Brain natriuretic peptide  ? Comprehensive metabolic panel  ? Brain natriuretic peptide  ? Comprehensive metabolic panel  ? ECHOCARDIOGRAM COMPLETE  ? Essential hypertension (Chronic)  ?  Stable blood pressure on current dose of bisoprolol.  Should tolerate diuresis. ?  ?  ? Relevant Orders  ? EKG 12-Lead  ? Brain natriuretic peptide  ? Comprehensive metabolic panel  ? Brain natriuretic peptide  ? Comprehensive metabolic panel  ? CAD (coronary artery disease) (Chronic)  ?  Moderate LAD disease dated back 2003.  Not complaining of chest pain.  Has had Myoview evaluations that were nonischemic since then.  She is not really having angina so I am not convinced that this is an anginal equivalent, but need to pay attention to this be a possibility. ? ?Continue beta-blocker ?Not on antiplatelet agents because of warfarin ?Also per her choice.  No longer on statin ?  ?  ?  Relevant Orders  ? ECHOCARDIOGRAM COMPLETE  ? Chronic diastolic heart failure (HCC) - Primary (Chronic)  ?  Concern for acute exacerbation with orthopnea, PND, dyspnea.  She has not really been taking her diuretics well and her weight is about 10 pounds up from last year. ? ?Plan: ?PPM interrogation to see if she is in A-fib ?Would like to get 2D echo checked ?BNP, CMP today; recheck in 7 days to reassess renal status and electrolytes following diuresis.: ? ?TAKE METOLAZONE 2.5 mg   30 MIN BEFORE YOUR LASIX  40 mg ON THESE DAYS( TUE , THURS , SAT)  ? ON Monday, Wednesday Friday , Sunday  Lasix   80 mg  ( 2 tablets of 40 mg)  ? ?  Weigh yourself everyday first thing in the morning.  ?  Once you get to a weight were you fell better meaning you do not have nay issue with swelling and breathing is better. This is what we would  call your dry weight. ? This is the weight your should be maintain. ?  ?  ? Relevant Orders  ? EKG 12-Lead  ? Brain natriuretic peptide  ? Comprehensive metabolic panel  ? Brain natriuretic peptide  ? Comprehensive metabolic panel  ? ECHOCARDIOGRAM COMPLETE  ? Non-rheumatic mitral regurgitation (Chronic)  ?  I do want to reassess with an echocardiogram, but would like to get her volume status more stabilized.  We will titrate her diuretic and  then see her back in close follow-up.  Not sure if we can get the echo before I see her back. ? ?I do not really hear a significant murmur, but if she is potentially back in A-fib, this could be exacerbating her dyspnea. ?  ?  ? Relevant Orders  ? ECHOCARDIOGRAM COMPLETE  ?  ? Other  ? Dyspnea  ? Relevant Orders  ? EKG 12-Lead  ? Brain natriuretic peptide  ? Comprehensive metabolic panel  ? Brain natriuretic peptide  ? Comprehensive metabolic panel  ? ECHOCARDIOGRAM COMPLETE  ? Secondary hypercoagulable state ([place]) (Chronic)  ?  Per her choice, converted from Xarelto to warfarin.  GI bleed back a year ago.  Doing well since then.  INR followed up with  warfarin clinic. ?  ?  ? Relevant Orders  ? EKG 12-Lead  ? Brain natriuretic peptide  ? Comprehensive metabolic panel  ? Brain natriuretic peptide  ? Comprehensive metabolic panel  ? Cardiac pacemaker (Chronic)  ?  Currently being ventricular paced.  We will interrogate to see if we can evaluate atrial rhythm. ?  ?  ? Relevant Orders  ? EKG 12-Lead  ? Brain natriuretic peptide  ? Comprehensive metabolic panel  ? Brain natriuretic peptide  ? Comprehensive metabolic panel  ? ECHOCARDIOGRAM COMPLETE  ? ? ?=================================== ? ?HPI:   ? ?BRET STAMOUR is a 88 y.o. female with a PMH with longstanding Moderate/Nonischemic CAD, moderate MR, LBBB, CHB-PPM, Persistent A-Fib (recently converted from [place] to warfarin), HFpEF, below who presents today for annual follow-up with complaints of worsening dyspnea and "not feeling well".. ? ?CARDIOVASCULAR HISTORY: ?--> Previously followed by Dr Verl Blalock, then Dr. Percival Spanish -> transferred [facility] because of other family member being followed as well. ?CAD:  ?Cardiac Cath 2003: 50-70% LAD, nonischemic Myoview. ?HFpEF -with Mod MR ?December 2013: Echo EF 50 and 55%.  Normal function.-Moderate MR.  Mild LA dilation  ?November 2015 --> Echo for shortness of breath => reduced EF of 45 to 50%.  Mild LVH.  Inferior HK.  GRII DD.  Moderate MR. ?Echo 10/2019: EF 60 -65%.  No RWMA.  Severe concentric LVH.  Unable to assess diastolic function.  Mild biatrial dilation.  Mod MR / TR, no AS ?CHB/LBBB ?March 2018--presented with fatigue and dyspnea, found to have third-degree AVB --> CRT-P (b/c reduced LVEF) ?F/u Echo 06/2016 (see below) - EF 60-65%.  ?Afib (Persistent Afib noted on PPM Interrogation) - 08/2016 ?CHA2DS2-VASc score 5: Age-3, female, HTN, aortic plaque-3. ?DCCV 01/04/2019 =>  Has since failed rhythm control at least 49 to 50% A. fib burden => she has declined FURTHER DCCV or AAT ?January 2023: Converted from Xarelto to warfarin -=> complaints of worsening arthritis pain but  the pain etc.  She asked to switch to Coumadin.   ? ?PHILLIPPA STRAUB was last seen on April 08, 2020 as her second follow-up visit with me after transferring care in July 2021.  She had been hospitalized in October 2021 with cholecystitis And Underwent Lap Chole.  We evaluated with an echocardiogram showing severe LVH and moderate TR/MR.  The majority this visit was spent discussing her postop symptoms.  She was having issues with not being to catch her breath and also episodes of feeling her heart "well".  Some exertional chest pressure and dizziness but nothing significant. ? ?Recent Hospitalizations:  ?March 29-31, 2022: Admitted with GI bleed.  Not a significant hemoglobin drop.  Xarelto held for short-term.  Remained euvolemic.  No issues. ? ?  Reviewed  CV studies:   ? ?The following studies were reviewed today: (if available, images/films reviewed: From Epic Chart or Care Everywhere) ?No new studies: ? ?Interval History:  ? ?YARIANA HOAGLUND presents today with multiple different complaints.  For the most part she is just not very feeling well.  She is just having a hard time breathing.  She states she cannot catch her breath.  Interview was very limited because of her extreme hard of hearing baseline.  She also just had multiple different complaints and was hard to focus.  Probably has some orthopnea, but sleeps sitting upright at baseline, has a hard time raising her legs because of pain in her back.  Edema is present, but not significant.  Clearly has PND. ?=> She has not really been taking the Zaroxolyn very often.  Maybe once a week.  She also does not always take furosemide daily. ? ?Not necessarily noting orthopnea beyond her baseline level sleeping, but does wake up short of breath.  She also notes getting short of breath if she sitting up and leaning forward putting pressure on her abdomen.  She has noted very poor sleep because of waking up short of breath, but this is also exacerbated by a lot of  nasal congestion. ? ?She says that sometimes she can just be sitting there and not doing anything and feels like she cannot catch her breath.  She gets extremely fatigued and worn out with just short little walks e

## 2021-06-06 ENCOUNTER — Encounter: Payer: Self-pay | Admitting: Cardiology

## 2021-06-06 DIAGNOSIS — R1011 Right upper quadrant pain: Secondary | ICD-10-CM | POA: Diagnosis not present

## 2021-06-06 DIAGNOSIS — R110 Nausea: Secondary | ICD-10-CM | POA: Diagnosis not present

## 2021-06-06 DIAGNOSIS — Z9049 Acquired absence of other specified parts of digestive tract: Secondary | ICD-10-CM | POA: Diagnosis not present

## 2021-06-06 LAB — COMPREHENSIVE METABOLIC PANEL
ALT: 21 IU/L (ref 0–32)
AST: 28 IU/L (ref 0–40)
Albumin/Globulin Ratio: 1.8 (ref 1.2–2.2)
Albumin: 4.4 g/dL (ref 3.5–4.6)
Alkaline Phosphatase: 110 IU/L (ref 44–121)
BUN/Creatinine Ratio: 30 — ABNORMAL HIGH (ref 12–28)
BUN: 34 mg/dL (ref 10–36)
Bilirubin Total: 1 mg/dL (ref 0.0–1.2)
CO2: 22 mmol/L (ref 20–29)
Calcium: 9.7 mg/dL (ref 8.7–10.3)
Chloride: 98 mmol/L (ref 96–106)
Creatinine, Ser: 1.15 mg/dL — ABNORMAL HIGH (ref 0.57–1.00)
Globulin, Total: 2.5 g/dL (ref 1.5–4.5)
Glucose: 132 mg/dL — ABNORMAL HIGH (ref 70–99)
Potassium: 4.4 mmol/L (ref 3.5–5.2)
Sodium: 136 mmol/L (ref 134–144)
Total Protein: 6.9 g/dL (ref 6.0–8.5)
eGFR: 44 mL/min/{1.73_m2} — ABNORMAL LOW (ref >59)

## 2021-06-06 LAB — BRAIN NATRIURETIC PEPTIDE: BNP: 420.5 pg/mL — ABNORMAL HIGH (ref 0.0–100.0)

## 2021-06-06 NOTE — Assessment & Plan Note (Signed)
Chronic issue.  I think she may be in A-fib now which could be exacerbating her symptoms.  Difficult to tell on EKG. ? ?Plan: Interrogate PPM ?? Continue bisoprolol for rate control and now on warfarin for [place] per her request. ?? She has previously declined antiarrhythmics or cardioversion.  Perhaps if she is having an exacerbation now we may build to try to do something to get her out of it-potentially use amiodarone short-term. ?

## 2021-06-06 NOTE — Assessment & Plan Note (Signed)
I do want to reassess with an echocardiogram, but would like to get her volume status more stabilized.  We will titrate her diuretic and then see her back in close follow-up.  Not sure if we can get the echo before I see her back. ? ?I do not really hear a significant murmur, but if she is potentially back in A-fib, this could be exacerbating her dyspnea. ?

## 2021-06-06 NOTE — Assessment & Plan Note (Signed)
Per her choice, converted from Xarelto to warfarin.  GI bleed back a year ago.  Doing well since then.  INR followed up with warfarin clinic. ?

## 2021-06-06 NOTE — Assessment & Plan Note (Signed)
Currently being ventricular paced.  We will interrogate to see if we can evaluate atrial rhythm. ?

## 2021-06-06 NOTE — Assessment & Plan Note (Signed)
Stable blood pressure on current dose of bisoprolol.  Should tolerate diuresis. ?

## 2021-06-06 NOTE — Assessment & Plan Note (Signed)
Concern for acute exacerbation with orthopnea, PND, dyspnea.  She has not really been taking her diuretics well and her weight is about 10 pounds up from last year. ? ?Plan: ?? PPM interrogation to see if she is in A-fib ?? Would like to get 2D echo checked ?? BNP, CMP today; recheck in 7 days to reassess renal status and electrolytes following diuresis.: ? ?TAKE METOLAZONE 2.5 mg   30 MIN BEFORE YOUR LASIX  40 mg ON THESE DAYS( TUE , THURS , SAT)  ? ON Monday, Wednesday Friday , Sunday  Lasix   80 mg  ( 2 tablets of 40 mg)  ? ?  Weigh yourself everyday first thing in the morning.  ?  Once you get to a weight were you fell better meaning you do not have nay issue with swelling and breathing is better. This is what we would  call your dry weight. ? This is the weight your should be maintain. ?

## 2021-06-06 NOTE — Assessment & Plan Note (Signed)
Moderate LAD disease dated back 2003.  Not complaining of chest pain.  Has had Myoview evaluations that were nonischemic since then.  She is not really having angina so I am not convinced that this is an anginal equivalent, but need to pay attention to this be a possibility. ? ?Continue beta-blocker ?Not on antiplatelet agents because of warfarin ?Also per her choice.  No longer on statin ?

## 2021-06-07 ENCOUNTER — Telehealth: Payer: Self-pay | Admitting: *Deleted

## 2021-06-07 ENCOUNTER — Telehealth: Payer: Self-pay

## 2021-06-07 NOTE — Telephone Encounter (Addendum)
Left voice message asking to give our office a call back pertaining to recent labs. ? ?----- Message from Leonie Man, MD sent at 06/06/2021  9:55 PM EDT ----- ?Lab work does look like there is evidence of some heart failure with elevated BNP level. ? ? ?Chemistry panel is relatively stable.  Kidney function is a little bit down, but similar to about a year ago.  Otherwise labs look okay. ? ?We will reassess labs next week. ? ?Glenetta Hew, MD ? ?

## 2021-06-07 NOTE — Telephone Encounter (Signed)
-----   Message from Simone Curia, RN sent at 06/05/2021 10:21 PM EDT ----- ?Regarding: FW: needs pacer interrogated to check afib burden ?Could someone have this patient send a remote transmission on 06/16/21 which is the Friday before her office visit. ? ?Thanks, ?Marliss Czar, RN ?----- Message ----- ?From: Raiford Simmonds, RN ?Sent: 06/05/2021   4:49 PM EDT ?To: Raiford Simmonds, RN, [facility] ?Subject: needs pacer interrogated to check afib burden ? ? Patient had an appointment  with Dr Ellyn Hack today .  He would like  her pacer to be   checked for afib burden before her next appointment. Which is will be 06/19/21 ?  Is the message sufficient or do I need to place an order or referral ?  ? ? Ivin Booty RN  ? ? ?

## 2021-06-07 NOTE — Telephone Encounter (Signed)
Request by Device clinic.  ?Called and Left message for daughter [name] to  do a pacer remote transmission on 06/15/21 or 06/16/21. Any question may call back ?

## 2021-06-12 ENCOUNTER — Ambulatory Visit (INDEPENDENT_AMBULATORY_CARE_PROVIDER_SITE_OTHER): Payer: PPO

## 2021-06-12 DIAGNOSIS — I442 Atrioventricular block, complete: Secondary | ICD-10-CM

## 2021-06-14 LAB — CUP PACEART REMOTE DEVICE CHECK
Battery Remaining Longevity: 50 mo
Battery Remaining Percentage: 45 %
Battery Voltage: 2.96 V
Brady Statistic AP VP Percent: 55 %
Brady Statistic AP VS Percent: 1 %
Brady Statistic AS VP Percent: 42 %
Brady Statistic AS VS Percent: 1 %
Brady Statistic RA Percent Paced: 7.1 %
Brady Statistic RV Percent Paced: 95 %
Date Time Interrogation Session: 20230411195739
Implantable Lead Implant Date: 20180313
Implantable Lead Implant Date: 20180313
Implantable Lead Location: 753859
Implantable Lead Location: 753860
Implantable Pulse Generator Implant Date: 20180313
Lead Channel Impedance Value: 440 Ohm
Lead Channel Impedance Value: 440 Ohm
Lead Channel Pacing Threshold Amplitude: 1.125 V
Lead Channel Pacing Threshold Amplitude: 1.25 V
Lead Channel Pacing Threshold Pulse Width: 0.5 ms
Lead Channel Pacing Threshold Pulse Width: 0.5 ms
Lead Channel Sensing Intrinsic Amplitude: 0.6 mV
Lead Channel Sensing Intrinsic Amplitude: 5.8 mV
Lead Channel Setting Pacing Amplitude: 1.375
Lead Channel Setting Pacing Amplitude: 2.5 V
Lead Channel Setting Pacing Pulse Width: 0.5 ms
Lead Channel Setting Sensing Sensitivity: 4 mV
Pulse Gen Model: 2272
Pulse Gen Serial Number: 8005625

## 2021-06-15 ENCOUNTER — Ambulatory Visit (HOSPITAL_COMMUNITY): Payer: PPO | Attending: Cardiology

## 2021-06-15 DIAGNOSIS — I5032 Chronic diastolic (congestive) heart failure: Secondary | ICD-10-CM | POA: Insufficient documentation

## 2021-06-15 DIAGNOSIS — Z950 Presence of cardiac pacemaker: Secondary | ICD-10-CM | POA: Insufficient documentation

## 2021-06-15 DIAGNOSIS — I2510 Atherosclerotic heart disease of native coronary artery without angina pectoris: Secondary | ICD-10-CM | POA: Diagnosis not present

## 2021-06-15 DIAGNOSIS — R0609 Other forms of dyspnea: Secondary | ICD-10-CM | POA: Insufficient documentation

## 2021-06-15 DIAGNOSIS — I480 Paroxysmal atrial fibrillation: Secondary | ICD-10-CM | POA: Insufficient documentation

## 2021-06-15 DIAGNOSIS — I340 Nonrheumatic mitral (valve) insufficiency: Secondary | ICD-10-CM | POA: Diagnosis not present

## 2021-06-15 HISTORY — PX: TRANSTHORACIC ECHOCARDIOGRAM: SHX275

## 2021-06-15 LAB — ECHOCARDIOGRAM COMPLETE
MV M vel: 5.04 m/s
MV Peak grad: 101.6 mmHg
Radius: 0.5 cm
S' Lateral: 3.6 cm

## 2021-06-17 NOTE — Progress Notes (Signed)
Echocardiogram results: ?Mildly decreased pump function with an ejection fraction of 40 to 45%.  Appears to be global with no regional abnormalities.  Right ventricle also appears mildly reduced.  Both atria are severely dilated.  There is severe mitral regurgitation and tricuspid regurgitation.  Aortic valve is calcified but not stenotic/narrowed. ? ?Mildly elevated right atrial pressures. ? ?Cardiac pump function has decreased from August 2021 with ejection fraction was 60 to 65%. ? ?This would suggest that her recent symptoms of shortness of breath or because of reduced pump function.  I suspect this may very well be related to being in an abnormal rhythm. ? ?No change to treatment recommendations ? ?Glenetta Hew, MD ?

## 2021-06-19 ENCOUNTER — Encounter: Payer: Self-pay | Admitting: Cardiology

## 2021-06-19 ENCOUNTER — Ambulatory Visit: Payer: PPO | Admitting: Cardiology

## 2021-06-19 VITALS — BP 122/72 | HR 70 | Ht 62.5 in | Wt 150.0 lb

## 2021-06-19 DIAGNOSIS — I5043 Acute on chronic combined systolic (congestive) and diastolic (congestive) heart failure: Secondary | ICD-10-CM

## 2021-06-19 DIAGNOSIS — I480 Paroxysmal atrial fibrillation: Secondary | ICD-10-CM | POA: Diagnosis not present

## 2021-06-19 DIAGNOSIS — I2510 Atherosclerotic heart disease of native coronary artery without angina pectoris: Secondary | ICD-10-CM

## 2021-06-19 DIAGNOSIS — D6869 Other thrombophilia: Secondary | ICD-10-CM | POA: Diagnosis not present

## 2021-06-19 DIAGNOSIS — I340 Nonrheumatic mitral (valve) insufficiency: Secondary | ICD-10-CM

## 2021-06-19 DIAGNOSIS — Z7189 Other specified counseling: Secondary | ICD-10-CM

## 2021-06-19 DIAGNOSIS — I10 Essential (primary) hypertension: Secondary | ICD-10-CM | POA: Diagnosis not present

## 2021-06-19 DIAGNOSIS — Z950 Presence of cardiac pacemaker: Secondary | ICD-10-CM | POA: Diagnosis not present

## 2021-06-19 MED ORDER — FUROSEMIDE 40 MG PO TABS: ORAL_TABLET | ORAL | 3 refills | Status: DC

## 2021-06-19 MED ORDER — LOSARTAN POTASSIUM 25 MG PO TABS: 25.0000 mg | ORAL_TABLET | Freq: Every day | ORAL | 3 refills | Status: DC

## 2021-06-19 NOTE — Progress Notes (Signed)
? ? ?Primary Care Provider: [name], MD ?Cardiologist: Glenetta Hew, MD ?Electrophysiologist: None ? ?Clinic Note: ?Chief Complaint  ?Patient presents with  ? 2 Week Follow-up  ?  Echo results  ? Congestive Heart Failure  ?  Still notes feeling poorly with dyspnea, PND and orthopnea.  ? Atrial Fibrillation  ?  PPM interrogation 06/18/2021 showed 45% A-fib burden with V pacing.  ?None Home none ?=================================== ? ?ASSESSMENT/PLAN  ? ?Problem List Items Addressed This Visit   ? ?  ? Cardiology Problems  ? Acute on chronic combined systolic and diastolic CHF (congestive heart failure) (HCC) - Primary (Chronic)  ?  Newly diagnosed or newly noted reduced EF from August 2021. ? ?Hard to tell what the etiology of the cardiomyopathy is since she has not really had any anginal symptoms but she does have documented history of CAD. ? ?One of the consideration is that with septal dyskinesis, there could be component of pacemaker syndrome.  She also has left bundle branch block at baseline.  Somewhat frustrating that at the time of her pacemaker placement, but she did not have CRT-P.  Somehow I doubt that there will be consideration at this point for upgrading. ? ?She is already on substantial diuretic regimen, however I will have her increase furosemide to 80 mg on Monday Wednesday and Friday, continue 40 mg on the Tuesday Thursday and Saturday days which she is taking the Zaroxolyn.  This will give her Sunday with just 40 mg Lasix. ? ?Also discussed sliding scale with dry weight goal of 145 pounds indicating that she would increase her Lasix dosing. ? ?Adding losartan for afterload reduction.  Does not have a lot of blood pressure room. ?Continue bisoprolol. ? ?Neck step would be to possibly consider spironolactone however I am a little leery of dehydrating this elderly woman. ? ?As for diagnostic thought process, I brought up the topic of right and left heart cath to assess for ischemic evaluation.   Based on previous discussion I do not think this would be something she would be interested in.  However she is now wanting to think about it. ? ?  ?  ? Relevant Medications  ? losartan (COZAAR) 25 MG tablet  ? furosemide (LASIX) 40 MG tablet  ? Paroxysmal atrial fibrillation (HCC) (Chronic)  ?  Chronic issue with 45% burden on interrogation.  I think this could be contributing to her dyspnea especially with reduced EF. ? ?I will may want to consider the possibility of antiarrhythmic.  She has previously declined antiarrhythmics as well as cardioversion.  I do not think cardioversion with severe facial large be of any benefit whatsoever.  I do think however we may want to reconsider the issue of antiarrhythmic. ? ?Unfortunately, probably the best option would be amiodarone, and I am fearful of side effects. ? ?For now we will try to treat heart failure and adjust accordingly. ? ?  ?  ? Relevant Medications  ? losartan (COZAAR) 25 MG tablet  ? furosemide (LASIX) 40 MG tablet  ? Essential hypertension (Chronic)  ?  She is only on bisoprolol 2.5 mg twice daily.  Would like to add some afterload reduction so we will start low-dose losartan and monitor closely. ? ?  ?  ? Relevant Medications  ? losartan (COZAAR) 25 MG tablet  ? furosemide (LASIX) 40 MG tablet  ? Coronary artery disease involving native heart without angina pectoris (Chronic)  ?  Previously described as nonobstructive CAD long ago.  Now  she has reduced EF with wall motion abnormality obvious septal dyskinesis likely related to conduction delay or pacemaker. ? ?With reduced EF now have to think about the possibility of ischemic disease. ? ?She is not currently on statin, based on patient preference.  Also not on a platelet agent because of warfarin. ?She is on beta-blocker and we are adding ARB. ? ?My preference would be to avoid invasive procedures and simply treat medically since she is not having angina. ? ?  ?  ? Relevant Medications  ? losartan  (COZAAR) 25 MG tablet  ? furosemide (LASIX) 40 MG tablet  ? Non-rheumatic mitral regurgitation (Chronic)  ?  Much regurgitation now appears to be severe along with severe tricuspid regurgitation.  I think some of this is clearly functional but is more concerning the fact that she has significant MR and her EF is down which would suggest EF initially worse than 40 to 45%. ? ?Interestingly the murmur was mild but is audible. ? ?We will discuss with Dr. [name] to see if he thinks that potential MitraClip would be an option although at her age, I am not sure how good candidate she would be.  Previously she has not wanted procedures done however today she seemed interested in this considering procedure. ? ?I explained to her that going that route would potentially require right left heart catheterization and a prolonged procedure. ? ?For now we will try to add some afterload reduction with losartan 25 mg daily, and titrate up her diuretic regimen with reducing dry weight down to 145 pounds. ? ?  ?  ? Relevant Medications  ? losartan (COZAAR) 25 MG tablet  ? furosemide (LASIX) 40 MG tablet  ?  ? Other  ? Goals of care, counseling/discussion  ?  A good portion of the visit today was explained to her that her heart has progressed to more significant findings of reduced ejection fraction and worsening MR/TR.  Without invasive procedures, there is no great option to fix these problems.  We are somewhat limited in medical management and therefore we need to start discussing goals of care. ? ?She is unsure about potential invasive procedures such as cardiac catheterization or even MitraClip.  In the past, she had not wanted even cardioversion or antiarrhythmics.  I then talked about CODE STATUS, and I think she is at this point not clear on what exactly CPR entails her likelihood of survival.  I explained to her that where she to go on into cardiac arrest, that are like to bring her back to any semblance of a state where she  currently is would be very slim.  She probably would only 1 thing we had a reasonable chance of success which would probably mean okay to treat if we were to do an invasive procedure. ? ?At this point it is not clear what which direction she would like to go see and therefore she will continue to be full code, however I think continued discussions need to be had.  The son seems to understand the direction I am having this conversation, but was not wanting to make an influential comment. ? ?  ?  ? Secondary hypercoagulable state ([place]) (Chronic)  ?  Longstanding A-fib.  Per patient choice, converted from [place] to warfarin.  INR followed at warfarin clinic. ? ?  ?  ? Cardiac pacemaker (Chronic)  ? ?=================================== ? ?HPI:   ? ?Beverly Kim is a 88 y.o. female with a PMH with longstanding Moderate/Nonischemic  CAD, moderate (now severe) TR/MR, LBBB, CHB-PPM, Persistent A-Fib (recently converted from [place] to warfarin), chronic HFpEF-now HFrEF (EF 40 to 45%), below who presents today for annual follow-up with complaints of worsening dyspnea and "not feeling well".. ? ?CARDIOVASCULAR HISTORY: ?--> Previously followed by Dr Verl Blalock, then Dr. Percival Spanish -> transferred [facility] because of other family member being followed as well. ?CAD:  ?Cardiac Cath 2003: 50-70% LAD, nonischemic Myoview. ?HFpEF -with Mod MR/TTR ?December 2013: Echo EF 50 and 55%.  Normal function.-Moderate MR.  Mild LA dilation  ?November 2015 --> Echo for shortness of breath => reduced EF of 45 to 50%.  Mild LVH.  Inferior HK.  GRII DD.  Moderate MR. ?Echo 10/2019: EF 60 -65%.  No RWMA.  Severe concentric LVH.  Unable to assess diastolic function.  Mild biatrial dilation.  Mod MR / TR, no AS ?CHB/LBBB ?March 2018--presented with fatigue and dyspnea, found to have third-degree AVB --> PPM (b/c reduced LVEF) ?F/u Echo 06/2016 (see below) - EF 60-65%.  ?Afib (Persistent Afib noted on PPM Interrogation) - 08/2016 ?CHA2DS2-VASc score 5: Age-61,  female, HTN, aortic plaque-3. ?DCCV 01/04/2019 =>  Has since failed rhythm control at least 49 to 50% A. fib burden => she has declined FURTHER DCCV or AAT ?January 2023: Converted from Xarelto to Coumadin-=> co

## 2021-06-19 NOTE — Patient Instructions (Addendum)
Medication Instructions:  ? ?New DRY WEIGHT SHOULD BE 145 LB  ? ? TAKE LASIX ( FUROSEMIDE)  Monday , Wednesday, Friday - 80 MG  ? 40 mg  on Tuesday,Thursday,Saturday ,  Sunday  along with  Metolazone before medication  3 times a week  Tuesday , Thursday  Saturday ?         Weigh yourself everyday first thing in the morning.  ?  Once you get to a your weight were you feel better meaning you do not have any issue with swelling and breathing is better. This is what we would  call your dry weight which will be 145 lbs. ? This is the weight your should be maintain ? ?Sliding scale Lasix: Weigh yourself when you get home, then Daily in the Morning. ?Your dry weight will be what your scale says on the day you return home.(here is 145 lbs.).  ?If you gain more than 3 pounds from dry weight: Increase the Lasix dosing to 40 mg in the morning and 40 mg in the afternoon until weight returns to baseline dry weight. ? ?LOSARTAN 25 MG  ONE TABLET DAILY  ? ? ?*If you need a refill on your cardiac medications before your next appointment, please call your pharmacy* ? ? ?Lab Work: ?NOT NEEDED ? ?If you have labs (blood work) drawn today and your tests are completely normal, you will receive your results only by: ?MyChart Message (if you have MyChart) OR ?A paper copy in the mail ?If you have any lab test that is abnormal or we need to change your treatment, we will call you to review the results. ? ? ?Testing/Procedures: ? ?NOT NEEDED ? ?Follow-Up: ?At  [facility], you and your health needs are our priority.  As part of our continuing mission to provide you with exceptional heart care, we have created designated Provider Care Teams.  These Care Teams include your primary Cardiologist (physician) and Advanced Practice Providers (APPs -  Physician Assistants and Nurse Practitioners) who all work together to provide you with the care you need, when you need it. ? ?  ? ?Your next appointment:   ?6 week(s) ? ?The format for your next  appointment:   ?In Person ? ?Provider:   ?Glenetta Hew, MD  ? ? ?Other Instructions  ?

## 2021-06-20 ENCOUNTER — Telehealth: Payer: Self-pay | Admitting: Licensed Clinical Social Worker

## 2021-06-20 ENCOUNTER — Encounter: Payer: Self-pay | Admitting: Podiatry

## 2021-06-20 ENCOUNTER — Encounter: Payer: Self-pay | Admitting: Cardiology

## 2021-06-20 DIAGNOSIS — Z7189 Other specified counseling: Secondary | ICD-10-CM | POA: Insufficient documentation

## 2021-06-20 DIAGNOSIS — I480 Paroxysmal atrial fibrillation: Secondary | ICD-10-CM | POA: Diagnosis not present

## 2021-06-20 DIAGNOSIS — Z7901 Long term (current) use of anticoagulants: Secondary | ICD-10-CM | POA: Diagnosis not present

## 2021-06-20 DIAGNOSIS — I5043 Acute on chronic combined systolic (congestive) and diastolic (congestive) heart failure: Secondary | ICD-10-CM

## 2021-06-20 DIAGNOSIS — Z6827 Body mass index (BMI) 27.0-27.9, adult: Secondary | ICD-10-CM | POA: Diagnosis not present

## 2021-06-20 DIAGNOSIS — I5040 Unspecified combined systolic (congestive) and diastolic (congestive) heart failure: Secondary | ICD-10-CM | POA: Diagnosis not present

## 2021-06-20 NOTE — Assessment & Plan Note (Signed)
Previously described as nonobstructive CAD long ago.  Now she has reduced EF with wall motion abnormality obvious septal dyskinesis likely related to conduction delay or pacemaker. ? ?With reduced EF now have to think about the possibility of ischemic disease. ? ?She is not currently on statin, based on patient preference.  Also not on a platelet agent because of warfarin. ?She is on beta-blocker and we are adding ARB. ? ?My preference would be to avoid invasive procedures and simply treat medically since she is not having angina. ?

## 2021-06-20 NOTE — Assessment & Plan Note (Signed)
Longstanding A-fib.  Per patient choice, converted from [place] to warfarin.  INR followed at warfarin clinic. ?

## 2021-06-20 NOTE — Assessment & Plan Note (Signed)
A good portion of the visit today was explained to her that her heart has progressed to more significant findings of reduced ejection fraction and worsening MR/TR.  Without invasive procedures, there is no great option to fix these problems.  We are somewhat limited in medical management and therefore we need to start discussing goals of care. ? ?She is unsure about potential invasive procedures such as cardiac catheterization or even MitraClip.  In the past, she had not wanted even cardioversion or antiarrhythmics.  I then talked about CODE STATUS, and I think she is at this point not clear on what exactly CPR entails her likelihood of survival.  I explained to her that where she to go on into cardiac arrest, that are like to bring her back to any semblance of a state where she currently is would be very slim.  She probably would only 1 thing we had a reasonable chance of success which would probably mean okay to treat if we were to do an invasive procedure. ? ?At this point it is not clear what which direction she would like to go see and therefore she will continue to be full code, however I think continued discussions need to be had.  The son seems to understand the direction I am having this conversation, but was not wanting to make an influential comment. ?

## 2021-06-20 NOTE — Assessment & Plan Note (Signed)
Much regurgitation now appears to be severe along with severe tricuspid regurgitation.  I think some of this is clearly functional but is more concerning the fact that she has significant MR and her EF is down which would suggest EF initially worse than 40 to 45%. ? ?Interestingly the murmur was mild but is audible. ? ?We will discuss with Dr. [name] to see if he thinks that potential MitraClip would be an option although at her age, I am not sure how good candidate she would be.  Previously she has not wanted procedures done however today she seemed interested in this considering procedure. ? ?I explained to her that going that route would potentially require right left heart catheterization and a prolonged procedure. ? ?For now we will try to add some afterload reduction with losartan 25 mg daily, and titrate up her diuretic regimen with reducing dry weight down to 145 pounds. ?

## 2021-06-20 NOTE — Assessment & Plan Note (Signed)
Newly diagnosed or newly noted reduced EF from August 2021. ? ?Hard to tell what the etiology of the cardiomyopathy is since she has not really had any anginal symptoms but she does have documented history of CAD. ? ?One of the consideration is that with septal dyskinesis, there could be component of pacemaker syndrome.  She also has left bundle branch block at baseline.  Somewhat frustrating that at the time of her pacemaker placement, but she did not have CRT-P.  Somehow I doubt that there will be consideration at this point for upgrading. ? ?She is already on substantial diuretic regimen, however I will have her increase furosemide to 80 mg on Monday Wednesday and Friday, continue 40 mg on the Tuesday Thursday and Saturday days which she is taking the Zaroxolyn.  This will give her Sunday with just 40 mg Lasix. ? ?Also discussed sliding scale with dry weight goal of 145 pounds indicating that she would increase her Lasix dosing. ? ?Adding losartan for afterload reduction.  Does not have a lot of blood pressure room. ?Continue bisoprolol. ? ?Neck step would be to possibly consider spironolactone however I am a little leery of dehydrating this elderly woman. ? ?As for diagnostic thought process, I brought up the topic of right and left heart cath to assess for ischemic evaluation.  Based on previous discussion I do not think this would be something she would be interested in.  However she is now wanting to think about it. ?

## 2021-06-20 NOTE — Progress Notes (Signed)
? ? ?06/21/2021 ?Beverly Kim ?[id number] ?10/09/1924 ? ?Referring provider: [name], MD ?Primary GI doctor: Dr. Hilarie Fredrickson ? ?ASSESSMENT AND PLAN: : ? ?Gastroesophageal reflux disease with history of Hiatal hernia ?-     pantoprazole (PROTONIX) 40 MG tablet; Take 1 tablet (40 mg total) by mouth 2 (two) times daily before a meal for 14 days, THEN 1 tablet (40 mg total) daily. ? ?Abdominal pain, right upper quadrant ?-     CBC with Differential/Platelet; Future ?-     Comprehensive metabolic panel; Future ?-     pantoprazole (PROTONIX) 40 MG tablet; Take 1 tablet (40 mg total) by mouth 2 (two) times daily before a meal for 14 days, THEN 1 tablet (40 mg total) daily. ?Will repeat labs to evaluate for anemia/liver function ?? GERD versus hiatal hernia ?No rash but can do trial of salon pas patches ?? If heart related with recent decrease in EF? Follow up Dr. Ellyn Hack.  ?If not improving can consider repeat CT AB and pelvis ? ?Hypothyroidism, unspecified type ?-     TSH; Future ?Will recheck was 11 last visit.  ? ?Acute on chronic combined systolic and diastolic CHF (congestive heart failure) ([place]) ?Coronary artery disease involving native coronary artery of native heart without angina pectoris ?her new onset combined systolic and diastolic heart failure with septal dyskinesis and reduced EF, seen yesterday in the office, decided to treat medically, potential need for left and right heart catheterization in the future pending symptoms.   ?Follow up Dr. Ellyn Hack, ER precautions discussed- ? If RUQ pain is anginal? ? ?Chronic a-fib ([place]) ?Coumadin for chronic atrial fibrillation, last INR 2.3 ? ? ?History of Present Illness:  ?88 y.o. female  with a past medical history of atrial fibrillation, moderate nonischemic coronary artery disease, severe TR/MR acute on chronic's combined systolic and diastolic heart failure status post pacemaker, Echo 06/15/2021: EF 40 to 45% and others listed below, returns to clinic today for  evaluation of abdominal pain and nausea ?Marland Kitchen ?Patient follows with Dr. Ellyn Hack for her new onset combined systolic and diastolic heart failure with septal dyskinesis and reduced EF, seen yesterday in the office, decided to treat medically, potential need for left and right heart catheterization in the future pending symptoms.  Patient with some evidence of hypervolemia that visit with elevated BNP   ?On Lasix. ?Patient is on Coumadin for chronic atrial fibrillation, last INR 2.3 ? ?Patient previously known to Dr. Olevia Perches, care assumed by Dr. Hilarie Fredrickson 2015.   ?Last seen in the office by Tye Savoy, NP 02/2014 for reflux. ?02/17/2014 esophagram shows a hiatal hernia with extensive gastroesophageal reflux barium tablet passed without difficulty, no stricture seen ?06/01/2020 CT abdomen pelvis with contrast for abdominal pain and rectal bleeding showed colonic diverticulosis from splenic flexure distally pericolonic fat stranding involving distal sigmoid colon may present diverticulitis, hepatic steatosis small amount of perihepatic ascites, cardiomegaly, status post cholecystectomy without biliary dilatation ?ERCP 07/21/2019 Elevated liver test dilated biliary tree porcelain gallbladder abnormal HIDA showed choledocholithiasis complete removal oral biliary sphincterectomy, dilatation and balloon extraction.,   ?07/15/2009 colonoscopy with Dr. Maurene Capes for abdominal pain showed prep good using MiraLAX, severe diverticulosis 15 cm sessile polyp polypoid colorectal mucosa with lymphoid aggregates, no adenomatous changes or malignancy ? ?S/p choley March 2022.  ?She has had RUQ pain x 3-4 months, , has discomfort, daily.  ?Has had episodes moving in her right shoulder, some SOB. See note above about Dr. Ellyn Hack and her new EF.  ?She has been  off pantoprazole.  ?Daughter is here and provides some of the history.  ?She has nausea, feels like "pregnancy" nausea. Can be more after she eats. She has some occ acid into her throat,  after eating, worse with bending over.  ?She has constipation, states secondary to her fluid pills.  ?Normal liver function 06/05/2021, normal CBC 11//04/2020. ? ? ?CBC  01/04/2021  ?HGB 13.5 MCV 96.2 without evidence of anemia ?WBC 7.4 Platelets 144.0 ?Kidney function 06/05/2021  ?BUN 34 Cr 1.15  ?GFR 45  ?Potassium 4.4   ?LFTs 06/05/2021  ?AST 28 ALT 21 ?Alkphos 110 TBili 1.0 ?07/31/2019 LIPASE 33 ?01/04/2021 TSH 11.47 INCREASED TO 100MCG DAILY- HAS NOT BEEN RECHECKED. ? ? ?Current Medications:  ? ?Current Outpatient Medications (Endocrine & Metabolic):  ?  SYNTHROID 100 MCG tablet, TAKE 1 TABLET BY MOUTH EVERY DAY BEFORE BREAKFAST ? ?Current Outpatient Medications (Cardiovascular):  ?  bisoprolol (ZEBETA) 5 MG tablet, Take 0.5 tablets (2.5 mg total) by mouth 2 (two) times daily. ?  furosemide (LASIX) 40 MG tablet, Take 80 mg  ( 2 tablet) on Monday, Wednesday, Friday ?  losartan (COZAAR) 25 MG tablet, Take 1 tablet (25 mg total) by mouth daily. ?  metolazone (ZAROXOLYN) 2.5 MG tablet, TAKE 1 TABLET BY MOUTH THREE TIMES A WEEK (Patient taking differently: TAKE 1 TABLET BY MOUTH ONCE A WEEK) ? ?Current Outpatient Medications (Respiratory):  ?  albuterol (VENTOLIN HFA) 108 (90 Base) MCG/ACT inhaler, Inhale 1-2 puffs into the lungs every 6 (six) hours as needed for wheezing or shortness of breath. ? ?Current Outpatient Medications (Analgesics):  ?  acetaminophen (TYLENOL) 500 MG tablet, Take 500 mg by mouth every 6 (six) hours as needed. ?  acetaminophen (TYLENOL) 650 MG CR tablet, Take 650 mg by mouth every 8 (eight) hours as needed for pain. ? ?Current Outpatient Medications (Hematological):  ?  warfarin (COUMADIN) 2.5 MG tablet, TAKE 1-2 TABLETS DAILY OR AS PRESCRIBED BY COUMADIN CLINIC ? ?Current Outpatient Medications (Other):  ?  Melatonin 5 MG CAPS, Take 5 mg by mouth at bedtime as needed (sleep). ?  ondansetron (ZOFRAN-ODT) 4 MG disintegrating tablet, Take 4 mg by mouth every 8 (eight) hours as needed. ?  pantoprazole  (PROTONIX) 40 MG tablet, Take 1 tablet (40 mg total) by mouth 2 (two) times daily before a meal for 14 days, THEN 1 tablet (40 mg total) daily. ?  potassium chloride (KLOR-CON) 10 MEQ tablet, TAKE 1 TABLET BY MOUTH ONCE DAILY ON  DAYS  METOLAZONE  ARE  TAKEN ? ?Surgical History:  ?She  has a past surgical history that includes Colonoscopy; Total abdominal hysterectomy w/ bilateral salpingoophorectomy; Tonsillectomy and adenoidectomy; Thyroidectomy; Hernia repair; PACEMAKER IMPLANT (Left, 05/15/2016); Cardioversion (N/A, 01/16/2019); ERCP (N/A, 07/21/2019); sphincterotomy (07/21/2019); removal of stones (07/21/2019); Cholecystectomy (N/A, 12/15/2019); LEFT HEART CATH AND CORONARY ANGIOGRAPHY (04/18/2001); NM MYOVIEW LTD (01/2012); transthoracic echocardiogram (06/27/2016); transthoracic echocardiogram (02/22/2012); transthoracic echocardiogram (10/2019); and transthoracic echocardiogram (06/15/2021). ?Family History:  ?Her family history includes Coronary artery disease in an other family member; Diabetes in an other family member; Heart Problems in an other family member; Heart attack in her daughter; Heart disease in her brother and sister; Other in some other family members; Sudden death in her son. ?Social History:  ? reports that she has never smoked. She has never used smokeless tobacco. She reports that she does not drink alcohol and does not use drugs. ? ?Current Medications, Allergies, Past Medical History, Past Surgical History, Family History and Social History were reviewed in [place]  Link electronic medical record. ? ?Physical Exam: ?BP 114/70   Pulse 69   Ht '5\' 2"'$  (1.575 m)   Wt 148 lb (67.1 kg)   SpO2 96%   BMI 27.07 kg/m?  ?General:   Pleasant, chronically ill appearing elderly female in no acute distress, in wheelchair, HOH ?Heart:  Regular rate and rhythm; distant heart sounds, systolic murmur ?Pulm: Clear anteriorly; no wheezing ?Abdomen:  Soft, Obese AB, Active bowel sounds. No tenderness  in the epigastrium. Without guarding and Without rebound, No organomegaly appreciated. ?Extremities:  with non pitting mild  edema bilateral. ?Neurologic:  Alert and  oriented x4;  No focal deficits.  ?Psyc

## 2021-06-20 NOTE — Assessment & Plan Note (Signed)
She is only on bisoprolol 2.5 mg twice daily.  Would like to add some afterload reduction so we will start low-dose losartan and monitor closely. ?

## 2021-06-20 NOTE — Assessment & Plan Note (Signed)
Chronic issue with 45% burden on interrogation.  I think this could be contributing to her dyspnea especially with reduced EF. ? ?I will may want to consider the possibility of antiarrhythmic.  She has previously declined antiarrhythmics as well as cardioversion.  I do not think cardioversion with severe facial large be of any benefit whatsoever.  I do think however we may want to reconsider the issue of antiarrhythmic. ? ?Unfortunately, probably the best option would be amiodarone, and I am fearful of side effects. ? ?For now we will try to treat heart failure and adjust accordingly. ?

## 2021-06-21 ENCOUNTER — Other Ambulatory Visit (INDEPENDENT_AMBULATORY_CARE_PROVIDER_SITE_OTHER): Payer: PPO

## 2021-06-21 ENCOUNTER — Ambulatory Visit: Payer: PPO | Admitting: Physician Assistant

## 2021-06-21 ENCOUNTER — Encounter: Payer: Self-pay | Admitting: Physician Assistant

## 2021-06-21 VITALS — BP 114/70 | HR 69 | Ht 62.0 in | Wt 148.0 lb

## 2021-06-21 DIAGNOSIS — K219 Gastro-esophageal reflux disease without esophagitis: Secondary | ICD-10-CM

## 2021-06-21 DIAGNOSIS — K5730 Diverticulosis of large intestine without perforation or abscess without bleeding: Secondary | ICD-10-CM

## 2021-06-21 DIAGNOSIS — R1011 Right upper quadrant pain: Secondary | ICD-10-CM | POA: Diagnosis not present

## 2021-06-21 DIAGNOSIS — I5043 Acute on chronic combined systolic (congestive) and diastolic (congestive) heart failure: Secondary | ICD-10-CM | POA: Diagnosis not present

## 2021-06-21 DIAGNOSIS — E039 Hypothyroidism, unspecified: Secondary | ICD-10-CM

## 2021-06-21 DIAGNOSIS — K449 Diaphragmatic hernia without obstruction or gangrene: Secondary | ICD-10-CM

## 2021-06-21 DIAGNOSIS — I482 Chronic atrial fibrillation, unspecified: Secondary | ICD-10-CM | POA: Diagnosis not present

## 2021-06-21 DIAGNOSIS — I2510 Atherosclerotic heart disease of native coronary artery without angina pectoris: Secondary | ICD-10-CM

## 2021-06-21 LAB — CBC WITH DIFFERENTIAL/PLATELET
Basophils Absolute: 0.1 10*3/uL (ref 0.0–0.1)
Basophils Relative: 0.9 % (ref 0.0–3.0)
Eosinophils Absolute: 0.1 10*3/uL (ref 0.0–0.7)
Eosinophils Relative: 1.9 % (ref 0.0–5.0)
HCT: 39.6 % (ref 36.0–46.0)
Hemoglobin: 13.4 g/dL (ref 12.0–15.0)
Lymphocytes Relative: 22.5 % (ref 12.0–46.0)
Lymphs Abs: 1.2 10*3/uL (ref 0.7–4.0)
MCHC: 33.7 g/dL (ref 30.0–36.0)
MCV: 93.9 fl (ref 78.0–100.0)
Monocytes Absolute: 0.5 10*3/uL (ref 0.1–1.0)
Monocytes Relative: 8.4 % (ref 3.0–12.0)
Neutro Abs: 3.6 10*3/uL (ref 1.4–7.7)
Neutrophils Relative %: 66.3 % (ref 43.0–77.0)
Platelets: 156 10*3/uL (ref 150.0–400.0)
RBC: 4.22 Mil/uL (ref 3.87–5.11)
RDW: 15.4 % (ref 11.5–15.5)
WBC: 5.5 10*3/uL (ref 4.0–10.5)

## 2021-06-21 LAB — COMPREHENSIVE METABOLIC PANEL
ALT: 21 U/L (ref 0–35)
AST: 28 U/L (ref 0–37)
Albumin: 4.2 g/dL (ref 3.5–5.2)
Alkaline Phosphatase: 91 U/L (ref 39–117)
BUN: 43 mg/dL — ABNORMAL HIGH (ref 6–23)
CO2: 30 mEq/L (ref 19–32)
Calcium: 9.9 mg/dL (ref 8.4–10.5)
Chloride: 94 mEq/L — ABNORMAL LOW (ref 96–112)
Creatinine, Ser: 1.19 mg/dL (ref 0.40–1.20)
GFR: 39.08 mL/min — ABNORMAL LOW (ref >60.00)
Glucose, Bld: 122 mg/dL — ABNORMAL HIGH (ref 70–99)
Potassium: 3.2 mEq/L — ABNORMAL LOW (ref 3.5–5.1)
Sodium: 135 mEq/L (ref 135–145)
Total Bilirubin: 1.4 mg/dL — ABNORMAL HIGH (ref 0.2–1.2)
Total Protein: 7.3 g/dL (ref 6.0–8.3)

## 2021-06-21 LAB — TSH: TSH: 3.45 u[IU]/mL (ref 0.35–5.50)

## 2021-06-21 MED ORDER — PANTOPRAZOLE SODIUM 40 MG PO TBEC: DELAYED_RELEASE_TABLET | ORAL | 0 refills | Status: DC

## 2021-06-21 NOTE — Telephone Encounter (Signed)
I called the patient and she did not answer , there was no vm on the line.   I entered all the information for the transportation questionaire in the system.  I will try to reach the patient again to see if she can get someone to bring her to the office for a bus pass. Patient has HTA and transportation is not covered.

## 2021-06-21 NOTE — Patient Instructions (Addendum)
If you are age 86 or older, your body mass index should be between 23-30. Your Body mass index is 27.07 kg/m?Marland Kitchen If this is out of the aforementioned range listed, please consider follow up with your Primary Care Provider. ?________________________________________________________ ? ?The [facility] providers would like to encourage you to use [facility] to communicate with providers for non-urgent requests or questions.  Due to long hold times on the telephone, sending your provider a message by [facility] may be a faster and more efficient way to get a response.  Please allow 48 business hours for a response.  Please remember that this is for non-urgent requests.  ?_______________________________________________________ ? ?Your provider has requested that you go to the basement level for lab work before leaving today. Press "B" on the elevator. The lab is located at the first door on the left as you exit the elevator. ? ?TAKE PANTOPRAZOLE FOR TWICE A DAY FOR 2 WEEKS THEN ONCE A DAY ?WILL GET LABS, IF THEY ARE ABNORMAL WILL GET IMAGING ?TALK WITH DR. HARDING  ABOUT THE PAIN? CHEST PAIN ? ?CAN TRY SALON PAS PATCHES ON THE AREA TO SEE IF THAT HELPS. ? ?IF THIS IS NOT HELPING CALL us AND WE CAN SCHEDULE IMAGING.  ? ?Avoid spicy and acidic foods ?Avoid fatty foods ?Limit your intake of coffee, tea, alcohol, and carbonated drinks ?Work to maintain a healthy weight ?Keep the head of the bed elevated at least 3 inches with blocks or a wedge pillow if you are having any nighttime symptoms ?Stay upright for 2 hours after eating ?Avoid meals and snacks three to four hours before bedtime ? ?Follow up in 2-3 months with Vicie Mutters, PA-C ? ?Thank you for entrusting me with your care and choosing [facility]. ? ?Vicie Mutters, PA-C ? ?Hiatal Hernia ? ?A hiatal hernia occurs when part of the stomach slides above the muscle that separates the abdomen from the chest (diaphragm). A person can be born with a hiatal hernia  (congenital), or it may develop over time. In almost all cases of hiatal hernia, only the top part of the stomach pushes through the diaphragm. ?Many people have a hiatal hernia with no symptoms. The larger the hernia, the more likely it is that you will have symptoms. In some cases, a hiatal hernia allows stomach acid to flow back into the tube that carries food from your mouth to your stomach (esophagus). This may cause heartburn symptoms. Severe heartburn symptoms may mean that you have developed a condition called gastroesophageal reflux disease (GERD). ?What are the causes? ?This condition is caused by a weakness in the opening (hiatus) where the esophagus passes through the diaphragm to attach to the upper part of the stomach. A person may be born with a weakness in the hiatus, or a weakness can develop over time. ?What increases the risk? ?This condition is more likely to develop in: ?Older people. Age is a major risk factor for a hiatal hernia, especially if you are over the age of 33. ?Pregnant women. ?People who are overweight. ?People who have frequent constipation. ?What are the signs or symptoms? ?Symptoms of this condition usually develop in the form of GERD symptoms. Symptoms include: ?Heartburn. ?Belching. ?Indigestion. ?Trouble swallowing. ?Coughing or wheezing. ?Sore throat. ?Hoarseness. ?Chest pain. ?Nausea and vomiting. ?How is this diagnosed? ?This condition may be diagnosed during testing for GERD. Tests that may be done include: ?X-rays of your stomach or chest. ?An upper gastrointestinal (GI) series. This is an X-ray exam of  your GI tract that is taken after you swallow a chalky liquid that shows up clearly on the X-ray. ?Endoscopy. This is a procedure to look into your stomach using a thin, flexible tube that has a tiny camera and light on the end of it. ?How is this treated? ?This condition may be treated by: ?Dietary and lifestyle changes to help reduce GERD symptoms. ?Medicines. These may  include: ?Over-the-counter antacids. ?Medicines that make your stomach empty more quickly. ?Medicines that block the production of stomach acid (H2 blockers). ?Stronger medicines to reduce stomach acid (proton pump inhibitors). ?Surgery to repair the hernia, if other treatments are not helping. ?If you have no symptoms, you may not need treatment. ?Follow these instructions at home: ?Lifestyle and activity ?Do not use any products that contain nicotine or tobacco, such as cigarettes and e-cigarettes. If you need help quitting, ask your health care provider. ?Try to achieve and maintain a healthy body weight. ?Avoid putting pressure on your abdomen. Anything that puts pressure on your abdomen increases the amount of acid that may be pushed up into your esophagus. ?Avoid bending over, especially after eating. ?Raise the head of your bed by putting blocks under the legs. This keeps your head and esophagus higher than your stomach. ?Do not wear tight clothing around your chest or stomach. ?Try not to strain when having a bowel movement, when urinating, or when lifting heavy objects. ?Eating and drinking ?Avoid foods that can worsen GERD symptoms. These may include: ?Fatty foods, like fried foods. ?Citrus fruits, like oranges or lemon. ?Other foods and drinks that contain acid, like orange juice or tomatoes. ?Spicy food. ?Chocolate. ?Eat frequent small meals instead of three large meals a day. This helps prevent your stomach from getting too full. ?Eat slowly. ?Do not lie down right after eating. ?Do not eat 1-2 hours before bed. ?Do not drink beverages with caffeine. These include cola, coffee, cocoa, and tea. ?Do not drink alcohol. ?General instructions ?Take over-the-counter and prescription medicines only as told by your health care provider. ?Keep all follow-up visits as told by your health care provider. This is important. ?Contact a health care provider if: ?Your symptoms are not controlled with medicines or  lifestyle changes. ?You are having trouble swallowing. ?You have coughing or wheezing that will not go away. ?Get help right away if: ?Your pain is getting worse. ?Your pain spreads to your arms, neck, jaw, teeth, or back. ?You have shortness of breath. ?You sweat for no reason. ?You feel sick to your stomach (nauseous) or you vomit. ?You vomit blood. ?You have bright red blood in your stools. ?You have black, tarry stools. ?Summary ?A hiatal hernia occurs when part of the stomach slides above the muscle that separates the abdomen from the chest (diaphragm). ?A person may be born with a weakness in the hiatus, or a weakness can develop over time. ?Symptoms of hiatal hernia may include heartburn, trouble swallowing, or sore throat. ?Management of hiatal hernia includes eating frequent small meals instead of three large meals a day. ?Get help right away if you vomit blood, have bright red blood in your stools, or have black, tarry stools. ?This information is not intended to replace advice given to you by your health care provider. Make sure you discuss any questions you have with your health care provider. ?Document Revised: 01/03/2021 Document Reviewed: 01/21/2020 ?Elsevier Patient Education ? [place]. ? ?

## 2021-06-21 NOTE — Telephone Encounter (Signed)
Has this been addressed?

## 2021-06-21 NOTE — Addendum Note (Signed)
Addended by: Alexander Mt on: 06/21/2021 12:53 PM ? ? Modules accepted: Orders ? ?

## 2021-06-21 NOTE — Telephone Encounter (Signed)
LCSW received in-basket from pharmacy team regarding pt appt w/ coumadin clinic on 5/1. ?[facility] [employer] has dissolved as a centralized division, each office is responsible for arranging transportation.  ?Noted pt has [employer] and [name] coverage. I have referred her for care coordination through [facility] care guides for possible ride benefits through their team.  ?I will also mail additional options to pt listed home address.  ? ?Westley Hummer, MSW, LCSW ?Clinical Social Worker II ?[facility]  ?[phone]- work cell phone (preferred) ?[phone]- desk phone ? ?

## 2021-06-22 ENCOUNTER — Other Ambulatory Visit: Payer: Self-pay

## 2021-06-22 ENCOUNTER — Telehealth: Payer: Self-pay

## 2021-06-22 MED ORDER — POTASSIUM CHLORIDE CRYS ER 20 MEQ PO TBCR: 20.0000 meq | EXTENDED_RELEASE_TABLET | Freq: Two times a day (BID) | ORAL | 0 refills | Status: DC

## 2021-06-22 NOTE — Telephone Encounter (Signed)
? ?  Telephone encounter was:  Successful.  ?06/22/2021 ?Name: Beverly Kim MRN: [id number] DOB: 10-03-1924 ? ?Beverly Kim is a 88 y.o. year old female who is a primary care patient of Burns, Bobette Mo, MD . The community resource team was consulted for assistance with Transportation Needs  ? ?Care guide performed the following interventions: Patient provided with information about care guide support team and interviewed to confirm resource needs.Thank you for taking the time to speak with one of our team members regarding your need for community resources. Enclosed are some of these resources for your county of residence, I hope you find this information helpful. If you have any further questions, please call me at the number listed below. ? ? ?Follow Up Plan:  Care guide will follow up with patient by phone over the next day ? ? ? ?Lenard Forth ?Care Guide, Embedded Care Coordination ?[place], Care Management  ?[phone] ?[address] [place], [place], [place] [address] ?Phone: [phone] ?Email: [email or web address] ? ?  ?

## 2021-06-22 NOTE — Telephone Encounter (Signed)
? ?  Telephone encounter was:  Successful.  ?06/22/2021 ?Name: ERINA GIORGIANNI MRN: [id number] DOB: 06-07-1924 ? ?SAE EUTSLER is a 88 y.o. year old female who is a primary care patient of Burns, Bobette Mo, MD . The community resource team was consulted for assistance with Transportation Needs  ? ?Care guide performed the following interventions: Patient provided with information about care guide support team and interviewed to confirm resource needs.Spoke with patients daughter [name] and sent transportation resources to her via email with Tams application to fill out. She will call me back and let me know if anything is avaible for her appointment date on 4/25  ? ?Follow Up Plan:  Care guide will follow up with patient by phone over the next day ? ? ? ?Lenard Forth ?Care Guide, Embedded Care Coordination ?[place], Care Management  ?[phone] ?[address] [place], [place], [place] [address] ?Phone: [phone] ?Email: [email or web address] ? ?  ?

## 2021-06-23 ENCOUNTER — Telehealth: Payer: Self-pay

## 2021-06-23 NOTE — Telephone Encounter (Signed)
? ?  Telephone encounter was:  Unsuccessful.  06/23/2021 ?Name: Beverly Kim MRN: [id number] DOB: 23-Apr-1924 ? ?Unsuccessful outbound call made today to assist with:  Transportation Needs  ? ? ?A HIPAA compliant voice message was left requesting a return call.  Instructed patient to call back at  earliest convenience. ?. ? ? ? ?Lenard Forth ?Care Guide, Embedded Care Coordination ?[place], Care Management  ?[phone] ?[address] [place], [place], [place] [address] ?Phone: [phone] ?Email: [email or web address] ? ?  ?

## 2021-06-23 NOTE — Progress Notes (Signed)
Addendum: Reviewed and agree with assessment and management plan. Katalina Magri M, MD  

## 2021-06-27 ENCOUNTER — Telehealth: Payer: Self-pay

## 2021-06-27 ENCOUNTER — Encounter: Payer: Self-pay | Admitting: Podiatry

## 2021-06-27 ENCOUNTER — Telehealth: Payer: Self-pay | Admitting: *Deleted

## 2021-06-27 ENCOUNTER — Ambulatory Visit: Payer: PPO | Admitting: Podiatry

## 2021-06-27 DIAGNOSIS — B351 Tinea unguium: Secondary | ICD-10-CM | POA: Diagnosis not present

## 2021-06-27 DIAGNOSIS — E119 Type 2 diabetes mellitus without complications: Secondary | ICD-10-CM | POA: Diagnosis not present

## 2021-06-27 DIAGNOSIS — M79674 Pain in right toe(s): Secondary | ICD-10-CM | POA: Diagnosis not present

## 2021-06-27 DIAGNOSIS — M2042 Other hammer toe(s) (acquired), left foot: Secondary | ICD-10-CM

## 2021-06-27 DIAGNOSIS — E1142 Type 2 diabetes mellitus with diabetic polyneuropathy: Secondary | ICD-10-CM | POA: Diagnosis not present

## 2021-06-27 DIAGNOSIS — M79675 Pain in left toe(s): Secondary | ICD-10-CM

## 2021-06-27 DIAGNOSIS — M2041 Other hammer toe(s) (acquired), right foot: Secondary | ICD-10-CM | POA: Diagnosis not present

## 2021-06-27 MED ORDER — SPIRONOLACTONE 25 MG PO TABS: 12.5000 mg | ORAL_TABLET | Freq: Every day | ORAL | 3 refills | Status: DC

## 2021-06-27 NOTE — Telephone Encounter (Signed)
-----   Message from Leonie Man, MD sent at 06/22/2021  9:06 AM EDT ----- ?I do think that starting spironolactone probably at 12.5 mg would be beneficial especially with her potassium being low. ? ?Glenetta Hew, MD ? ?

## 2021-06-27 NOTE — Patient Instructions (Signed)
Diabetes Mellitus and Foot Care ?Foot care is an important part of your health, especially when you have diabetes. Diabetes may cause you to have problems because of poor blood flow (circulation) to your feet and legs, which can cause your skin to: ?Become thinner and drier. ?Break more easily. ?Heal more slowly. ?Peel and crack. ?You may also have nerve damage (neuropathy) in your legs and feet, causing decreased feeling in them. This means that you may not notice minor injuries to your feet that could lead to more serious problems. Noticing and addressing any potential problems early is the best way to prevent future foot problems. ?How to care for your feet ?Foot hygiene ? ?Wash your feet daily with warm water and mild soap. Do not use hot water. Then, pat your feet and the areas between your toes until they are completely dry. Do not soak your feet as this can dry your skin. ?Trim your toenails straight across. Do not dig under them or around the cuticle. File the edges of your nails with an emery board or nail file. ?Apply a moisturizing lotion or petroleum jelly to the skin on your feet and to dry, brittle toenails. Use lotion that does not contain alcohol and is unscented. Do not apply lotion between your toes. ?Shoes and socks ?Wear clean socks or stockings every day. Make sure they are not too tight. Do not wear knee-high stockings since they may decrease blood flow to your legs. ?Wear shoes that fit properly and have enough cushioning. Always look in your shoes before you put them on to be sure there are no objects inside. ?To break in new shoes, wear them for just a few hours a day. This prevents injuries on your feet. ?Wounds, scrapes, corns, and calluses ? ?Check your feet daily for blisters, cuts, bruises, sores, and redness. If you cannot see the bottom of your feet, use a mirror or ask someone for help. ?Do not cut corns or calluses or try to remove them with medicine. ?If you find a minor scrape,  cut, or break in the skin on your feet, keep it and the skin around it clean and dry. You may clean these areas with mild soap and water. Do not clean the area with peroxide, alcohol, or iodine. ?If you have a wound, scrape, corn, or callus on your foot, look at it several times a day to make sure it is healing and not infected. Check for: ?Redness, swelling, or pain. ?Fluid or blood. ?Warmth. ?Pus or a bad smell. ?General tips ?Do not cross your legs. This may decrease blood flow to your feet. ?Do not use heating pads or hot water bottles on your feet. They may burn your skin. If you have lost feeling in your feet or legs, you may not know this is happening until it is too late. ?Protect your feet from hot and cold by wearing shoes, such as at the beach or on hot pavement. ?Schedule a complete foot exam at least once a year (annually) or more often if you have foot problems. Report any cuts, sores, or bruises to your health care provider immediately. ?Where to find more information ?American Diabetes Association: www.diabetes.org ?Association of Diabetes Care & Education Specialists: www.diabeteseducator.org ?Contact a health care provider if: ?You have a medical condition that increases your risk of infection and you have any cuts, sores, or bruises on your feet. ?You have an injury that is not healing. ?You have redness on your legs or feet. ?You   feel burning or tingling in your legs or feet. ?You have pain or cramps in your legs and feet. ?Your legs or feet are numb. ?Your feet always feel cold. ?You have pain around any toenails. ?Get help right away if: ?You have a wound, scrape, corn, or callus on your foot and: ?You have pain, swelling, or redness that gets worse. ?You have fluid or blood coming from the wound, scrape, corn, or callus. ?Your wound, scrape, corn, or callus feels warm to the touch. ?You have pus or a bad smell coming from the wound, scrape, corn, or callus. ?You have a fever. ?You have a red  line going up your leg. ?Summary ?Check your feet every day for blisters, cuts, bruises, sores, and redness. ?Apply a moisturizing lotion or petroleum jelly to the skin on your feet and to dry, brittle toenails. ?Wear shoes that fit properly and have enough cushioning. ?If you have foot problems, report any cuts, sores, or bruises to your health care provider immediately. ?Schedule a complete foot exam at least once a year (annually) or more often if you have foot problems. ?This information is not intended to replace advice given to you by your health care provider. Make sure you discuss any questions you have with your health care provider. ?Document Revised: 09/10/2019 Document Reviewed: 09/10/2019 ?Elsevier Patient Education ? [place]. ? ?Onychomycosis/Fungal Toenails ? ?WHAT IS IT? An infection that lies within the keratin of your nail plate that is caused by a fungus. ? ?WHY ME? Fungal infections affect all ages, sexes, races, and creeds.  There may be many factors that predispose you to a fungal infection such as age, coexisting medical conditions such as diabetes, or an autoimmune disease; stress, medications, fatigue, genetics, etc.  Bottom line: fungus thrives in a warm, moist environment and your shoes offer such a location. ? ?IS IT CONTAGIOUS? Theoretically, yes.  You do not want to share shoes, nail clippers or files with someone who has fungal toenails.  Walking around barefoot in the same room or sleeping in the same bed is unlikely to transfer the organism.  It is important to realize, however, that fungus can spread easily from one nail to the next on the same foot. ? ?HOW DO WE TREAT THIS?  There are several ways to treat this condition.  Treatment may depend on many factors such as age, medications, pregnancy, liver and kidney conditions, etc.  It is best to ask your doctor which options are available to you. ? ?No treatment.   Unlike many other medical concerns, you can live with this  condition.  However for many people this can be a painful condition and may lead to ingrown toenails or a bacterial infection.  It is recommended that you keep the nails cut short to help reduce the amount of fungal nail. ?Topical treatment.  These range from herbal remedies to prescription strength nail lacquers.  About 40-50% effective, topicals require twice daily application for approximately 9 to 12 months or until an entirely new nail has grown out.  The most effective topicals are medical grade medications available through physicians offices. ?Oral antifungal medications.  With an 80-90% cure rate, the most common oral medication requires 3 to 4 months of therapy and stays in your system for a year as the new nail grows out.  Oral antifungal medications do require blood work to make sure it is a safe drug for you.  A liver function panel will be performed prior to starting  the medication and after the first month of treatment.  It is important to have the blood work performed to avoid any harmful side effects.  In general, this medication safe but blood work is required. ?Laser Therapy.  This treatment is performed by applying a specialized laser to the affected nail plate.  This therapy is noninvasive, fast, and non-painful.  It is not covered by insurance and is therefore, out of pocket.  The results have been very good with a 80-95% cure rate.  The [facility] is the only practice in the area to offer this therapy. ?Permanent Nail Avulsion.  Removing the entire nail so that a new nail will not grow back. ? ?

## 2021-06-27 NOTE — Progress Notes (Signed)
ANNUAL DIABETIC FOOT EXAM ? ?Subjective: ?Beverly Kim presents today for annual diabetic foot examination. Her son is present during today's visit. ? ?Patient relates 6 month h/o diabetes. She states diabetes is controlled via diet. She minimizes the amount of carbohydrate intakes daily. ? ?Patient denies any h/o foot wounds.  She is using a topical medication for eczema and states it is improving. ? ?Risk factors: diabetes, diabetic neuropathy, HTN, CAD, CHF, CKD, hyperlipidemia. ? ?Beverly Rail, MD is patient's PCP. Last visit was January 04, 2021. ? ?Past Medical History:  ?Diagnosis Date  ? Cardiac pacemaker 05/2016  ? Sick sinus syndrome/complete heart block  ? Chronic heart failure with preserved ejection fraction (HFpEF) ([place])   ? Colon polyps   ? Diverticulosis   ? Dyspnea   ? GERD (gastroesophageal reflux disease)   ? Hearing loss of both ears   ? Hepatic cyst   ? Hiatal hernia   ? Hyperlipidemia   ? Hypertension   ? Hypothyroidism   ? Low back pain   ? Non-occlusive coronary artery disease 04/2001  ? Cardiac Cath 04/18/2001: 50 and 70% mid LAD after D1. = Nonischemic by Myoview.  Medical management.  ? Other left bundle branch block   ? PAF (paroxysmal atrial fibrillation) ([place]) 08/16/2016  ? observed on PPM interrogation, asymptomatic, chad2vasc score is 5.  Bisoprolol for rate control, Xarelto for anticoagulation.  ? URI (upper respiratory infection)   ? ?Patient Active Problem List  ? Diagnosis Date Noted  ? Goals of care, counseling/discussion 06/20/2021  ? Long term (current) use of anticoagulants 03/13/2021  ? Blurry vision 01/04/2021  ? Diabetic neuropathy ([place]) 06/29/2020  ? CKD (chronic kidney disease) stage 3, GFR 30-59 ml/min ([facility]) 06/28/2020  ? Rib pain on right side   ? GI bleed 05/31/2020  ? Cardiac pacemaker 09/17/2019  ? Preop cardiovascular exam 09/17/2019  ? Vertigo 09/12/2019  ? Unsteady gait when walking 09/12/2019  ? Dyshidrotic eczema 09/12/2019  ? Nausea 08/05/2019  ? Fluid  overload 07/31/2019  ? Paroxysmal atrial fibrillation ([place]) 01/23/2019  ? Secondary hypercoagulable state ([place]) 01/23/2019  ? Fatigue 11/25/2018  ? Poor balance 10/22/2018  ? Bilateral hearing loss 10/21/2018  ? Pyelonephritis 07/25/2017  ? Carotid disease, bilateral ([place]) 04/08/2017  ? Leg pain 04/08/2017  ? Non-rheumatic mitral regurgitation 07/12/2016  ? Complete heart block ([place]) 05/14/2016  ? Diabetes ([place]) 01/05/2016  ? Degenerative disc disease, cervical 08/23/2015  ? Degenerative disc disease, lumbar 08/23/2015  ? Left rotator cuff tear arthropathy 07/12/2015  ? Left shoulder pain 04/06/2015  ? Frozen shoulder 01/03/2015  ? Cough variant asthma 12/08/2013  ? Upper airway cough syndrome 10/27/2013  ? Dyspnea 02/03/2013  ? GERD 07/06/2009  ? Diaphragmatic hernia 07/06/2009  ? Diverticulosis of colon 07/06/2009  ? HEPATIC CYST 07/06/2009  ? COLONIC POLYPS, HX OF 07/06/2009  ? MIXED HEARING LOSS BILATERAL 12/13/2008  ? Essential hypertension 06/19/2008  ? MITRAL VALVE PROLAPSE 06/19/2008  ? LBBB (left bundle branch block) 06/19/2008  ? Hyperlipidemia 08/12/2007  ? Hypothyroidism 12/02/2006  ? Coronary artery disease involving native heart without angina pectoris 12/02/2006  ? Acute on chronic combined systolic and diastolic CHF (congestive heart failure) ([place]) 12/02/2006  ? ?Past Surgical History:  ?Procedure Laterality Date  ? CARDIOVERSION N/A 01/16/2019  ? Procedure: CARDIOVERSION;  Surgeon: Josue Hector, MD;  Location: [facility] ENDOSCOPY;  Service: Cardiovascular;  Laterality: N/A;  ? CHOLECYSTECTOMY N/A 12/15/2019  ? Procedure: LAPAROSCOPIC CHOLECYSTECTOMY;  Surgeon: Stark Klein, MD;  [facility]  OR;  Service: General;  Laterality: N/A;  ? COLONOSCOPY    ? ERCP N/A 07/21/2019  ? Procedure: ENDOSCOPIC RETROGRADE CHOLANGIOPANCREATOGRAPHY (ERCP);  Surgeon: Milus Banister, MD;  Location: [facility] ENDOSCOPY;  Service: Endoscopy;  Laterality: N/A;  ? HERNIA REPAIR    ? LEFT HEART CATH AND CORONARY ANGIOGRAPHY   04/18/2001  ? Dr. Lia Foyer: EF 60 to 65%.  2+ MR.  Upper LM takeoff with 90 degree bend just inside the ostium.  50-70% mid LAD after D1.  D1 is 30%.    Distal LAD wraps the apex and is free of disease.  LCx is mostly large OM branch.  RCA provides PDA and PL branches and Free of disease. -->  Presumably evaluated by Myoview that was nonischemic, therefore no PCI performed.  ? NM MYOVIEW LTD  01/2012  ? Normal EF.  Normal study.  No ischemia or infarction.  ? PACEMAKER IMPLANT Left 05/15/2016  ? SJM Assirity MRI dual chamber PPM implanted by Dr Rayann Heman for CHB  ? REMOVAL OF STONES  07/21/2019  ? Procedure: REMOVAL OF STONES;  Surgeon: Milus Banister, MD;  Location: [facility] ENDOSCOPY;  Service: Endoscopy;;  ? SPHINCTEROTOMY  07/21/2019  ? Procedure: SPHINCTEROTOMY;  Surgeon: Milus Banister, MD;  Location: [facility] ENDOS[facility];  Service: Endoscopy;;  ? THYROIDECTOMY    ? TONSILLECTOMY AND ADENOIDECTOMY    ? TOTAL ABDOMINAL HYSTERECTOMY W/ BILATERAL SALPINGOOPHORECTOMY    ? TRANSTHORACIC ECHOCARDIOGRAM  06/27/2016  ? Moderate basal-septal LVH, EF 60 to 65%.  No R WMA.  GR 1 DD.  Mild aortic valve calcification.  No AS.  Trivial MR.--Indicated improved EF compared to prior study in 2015.  ? TRANSTHORACIC ECHOCARDIOGRAM  02/22/2012  ? 12/'13 -a) EF 50 and 55%.  Normal function.-Moderate MR.  Mild LA dilation.; b) 11/2015Echo for shortness of breath => reduced EF of 45 to 50%.  Mild LVH.  Inferior reversible HK.  GRII DD.  Moderate MR.  ? TRANSTHORACIC ECHOCARDIOGRAM  10/2019  ? EF 60 -65%.  No RWMA.  Severe concentric LVH.  Unable to assess diastolic function.  Mild biatrial dilation.  Moderate MR moderate TR, no AS  ? TRANSTHORACIC ECHOCARDIOGRAM  06/15/2021  ? EF 40 to 45%.  Paradoxical septal motion from pacemaker.  Mild concentric LVH.  Indeterminate diastolic pressure.  Severe biatrial enlargement would argue significant diastolic dysfunction.  Severe MR and severe TR. => EF down from 60 to 65%.  ? ?Current Outpatient  Medications on File Prior to Visit  ?Medication Sig Dispense Refill  ? potassium chloride SA (KLOR-CON M) 20 MEQ tablet Take 1 tablet (20 mEq total) by mouth 2 (two) times daily for 5 days. 10 tablet 0  ? acetaminophen (TYLENOL) 500 MG tablet Take 500 mg by mouth every 6 (six) hours as needed.    ? acetaminophen (TYLENOL) 650 MG CR tablet Take 650 mg by mouth every 8 (eight) hours as needed for pain.    ? albuterol (VENTOLIN HFA) 108 (90 Base) MCG/ACT inhaler Inhale 1-2 puffs into the lungs every 6 (six) hours as needed for wheezing or shortness of breath. 1 each 2  ? bisoprolol (ZEBETA) 5 MG tablet Take 0.5 tablets (2.5 mg total) by mouth 2 (two) times daily. 90 tablet 3  ? furosemide (LASIX) 40 MG tablet Take 80 mg  ( 2 tablet) on Monday, Wednesday, Friday 90 tablet 3  ? losartan (COZAAR) 25 MG tablet Take 1 tablet (25 mg total) by mouth daily. 90 tablet 3  ? Melatonin 5  MG CAPS Take 5 mg by mouth at bedtime as needed (sleep).    ? metolazone (ZAROXOLYN) 2.5 MG tablet TAKE 1 TABLET BY MOUTH THREE TIMES A WEEK (Patient taking differently: TAKE 1 TABLET BY MOUTH ONCE A WEEK) 30 tablet 10  ? ondansetron (ZOFRAN-ODT) 4 MG disintegrating tablet Take 4 mg by mouth every 8 (eight) hours as needed.    ? pantoprazole (PROTONIX) 40 MG tablet Take 1 tablet (40 mg total) by mouth 2 (two) times daily before a meal for 14 days, THEN 1 tablet (40 mg total) daily. 104 tablet 0  ? SYNTHROID 100 MCG tablet TAKE 1 TABLET BY MOUTH EVERY DAY BEFORE BREAKFAST 90 tablet 1  ? warfarin (COUMADIN) 2.5 MG tablet TAKE 1-2 TABLETS DAILY OR AS PRESCRIBED BY COUMADIN CLINIC 30 tablet 3  ? ?No current facility-administered medications on file prior to visit.  ?  ?Allergies  ?Allergen Reactions  ? Adhesive [Tape] Itching, Dermatitis, Rash and Other (See Comments)  ?  Blisters and "skin bubbles"  ? Ace Inhibitors Cough  ? Atorvastatin Other (See Comments)  ?  REACTION: Reaction not known  ? Clindamycin Other (See Comments)  ?  Unknown  ? Codeine  Other (See Comments)  ?  hallucinations   ? Latex Other (See Comments)  ?  blisters  ? Shellfish Allergy Other (See Comments)  ?  "gallbladder attack"  ? Simvastatin Other (See Comments)  ?  fatigue  ? Penicillins Rash

## 2021-06-27 NOTE — Telephone Encounter (Signed)
? ?  Telephone encounter was:  Successful.  ?06/27/2021 ?Name: Beverly Kim MRN: [id number] DOB: 1924/03/18 ? ?RUPALI GONYA is a 88 y.o. year old female who is a primary care patient of Burns, Bobette Mo, MD . The community resource team was consulted for assistance with Transportation Needs  ? ?Care guide performed the following interventions: Patient provided with information about care guide support team and interviewed to confirm resource needs.Patient stated she is waiting for Tams to call her back for appoval , she also canceled her transportation for today because her son can take her to her appointment today ? ?Follow Up Plan:  Care guide will follow up with patient by phone over the next . day ? ? ? ?Lenard Forth ?Care Guide, Embedded Care Coordination ?[place], Care Management  ?[phone] ?[address] [place], [place], [place] [address] ?Phone: [phone] ?Email: [email or web address] ? ?  ?

## 2021-06-27 NOTE — Telephone Encounter (Signed)
Left detailed message on daughter's voicemail Mickel Baas )  of new medication -needing to start  per Dr Ellyn Hack ,Spironolactone 12.5 mg  daily.   ? ?Keep appointment in may 2023. Any question please call the office . # 30 day supply with refills. ( Just in case medication is increase at next appointment.) ?

## 2021-06-29 NOTE — Progress Notes (Signed)
Remote pacemaker transmission.   

## 2021-07-05 ENCOUNTER — Other Ambulatory Visit: Payer: Self-pay

## 2021-07-05 ENCOUNTER — Encounter: Payer: Self-pay | Admitting: Cardiology

## 2021-07-05 ENCOUNTER — Telehealth: Payer: Self-pay | Admitting: Pharmacist

## 2021-07-05 ENCOUNTER — Telehealth: Payer: Self-pay | Admitting: Internal Medicine

## 2021-07-05 MED ORDER — MOLNUPIRAVIR 200 MG PO CAPS: 4.0000 | ORAL_CAPSULE | Freq: Two times a day (BID) | ORAL | 0 refills | Status: DC

## 2021-07-05 MED ORDER — WARFARIN SODIUM 2.5 MG PO TABS
ORAL_TABLET | ORAL | 0 refills | Status: DC
Start: 2021-07-05 — End: 2021-08-03

## 2021-07-05 MED ORDER — MOLNUPIRAVIR 200 MG PO CAPS: 4.0000 | ORAL_CAPSULE | Freq: Two times a day (BID) | ORAL | 0 refills | Status: AC

## 2021-07-05 NOTE — Telephone Encounter (Signed)
Patient's daughter called. Beverly Kim has had Covid and diarrhea and no appetite.  Has been supplementing diet with Ensure to replace fluids and nutrition. Was concerned this may affect INR. Advised it likely has Vitamin K but it was more important right now that her mother feel better and we can reschedule INR appointment when she has recovered. Daughter voiced understanding. ?

## 2021-07-05 NOTE — Telephone Encounter (Signed)
An antiviral was sent to her pharmacy that is okay with all of her medications and her kidney function. ? ?It may be a good idea to do a telephone call tomorrow if she would like to discuss her symptoms and see if there is anything else we can do to help, but that is up to her.  If not we can do it if needed in the near future. ?

## 2021-07-05 NOTE — Telephone Encounter (Signed)
Spoke with daughter and info given. ? ?Info given and they will call back if phone call is needed.  ?

## 2021-07-05 NOTE — Telephone Encounter (Signed)
Tested positive for COVID yesterday and is very sick.  ? ?Requesting to call in something that can be taken with list of current medication.  ? ?Reports diarrhea and very weak, headache, sinus congestion (yellow), sore throat.  ?

## 2021-07-06 NOTE — Telephone Encounter (Signed)
I be happy to prescribe Paxlovid.  With this is something initially done by PCP.  Let me know if he can get over PCP and I will be happy to have it called in for her ==> will ask my Pharm team to assist (since there are potential drug interactions that I am not sure of). ? ?Glenetta Hew, MD ? ?

## 2021-07-07 NOTE — Telephone Encounter (Signed)
Good.

## 2021-07-07 NOTE — Telephone Encounter (Signed)
Looks like PCP gave her Lagevrio.   ?

## 2021-07-24 ENCOUNTER — Ambulatory Visit: Payer: PPO | Admitting: Cardiology

## 2021-07-25 ENCOUNTER — Encounter: Payer: Self-pay | Admitting: Cardiology

## 2021-07-25 ENCOUNTER — Ambulatory Visit: Payer: PPO | Admitting: Cardiology

## 2021-07-25 ENCOUNTER — Ambulatory Visit (INDEPENDENT_AMBULATORY_CARE_PROVIDER_SITE_OTHER): Payer: PPO | Admitting: Pharmacist Clinician (PhC)/ Clinical Pharmacy Specialist

## 2021-07-25 DIAGNOSIS — D6869 Other thrombophilia: Secondary | ICD-10-CM

## 2021-07-25 DIAGNOSIS — I10 Essential (primary) hypertension: Secondary | ICD-10-CM | POA: Diagnosis not present

## 2021-07-25 DIAGNOSIS — I480 Paroxysmal atrial fibrillation: Secondary | ICD-10-CM

## 2021-07-25 DIAGNOSIS — I340 Nonrheumatic mitral (valve) insufficiency: Secondary | ICD-10-CM

## 2021-07-25 DIAGNOSIS — I4819 Other persistent atrial fibrillation: Secondary | ICD-10-CM | POA: Diagnosis not present

## 2021-07-25 DIAGNOSIS — I5042 Chronic combined systolic (congestive) and diastolic (congestive) heart failure: Secondary | ICD-10-CM | POA: Diagnosis not present

## 2021-07-25 DIAGNOSIS — Z7901 Long term (current) use of anticoagulants: Secondary | ICD-10-CM

## 2021-07-25 DIAGNOSIS — I2510 Atherosclerotic heart disease of native coronary artery without angina pectoris: Secondary | ICD-10-CM | POA: Diagnosis not present

## 2021-07-25 DIAGNOSIS — Z950 Presence of cardiac pacemaker: Secondary | ICD-10-CM | POA: Diagnosis not present

## 2021-07-25 DIAGNOSIS — I447 Left bundle-branch block, unspecified: Secondary | ICD-10-CM | POA: Diagnosis not present

## 2021-07-25 LAB — POCT INR: INR: 2.5 (ref 2.0–3.0)

## 2021-07-25 MED ORDER — LOSARTAN POTASSIUM 25 MG PO TABS
25.0000 mg | ORAL_TABLET | Freq: Every evening | ORAL | 3 refills | Status: DC
Start: 2021-07-25 — End: 2022-01-02

## 2021-07-25 MED ORDER — BISOPROLOL FUMARATE 5 MG PO TABS: 2.5000 mg | ORAL_TABLET | Freq: Every day | ORAL | 3 refills | Status: DC

## 2021-07-25 NOTE — Progress Notes (Signed)
Primary Care Provider: [name], MD Cardiologist: Beverly Hew, MD Electrophysiologist: None  Clinic Note: Chief Complaint  Patient presents with   Follow-up    1 month.  Overall stable and somewhat improved.   Congestive Heart Failure    Maintaining stable weight.  Just fatigued   Atrial Fibrillation    Unsure of whether she is or is not in A-fib.  None Home none ===================================  ASSESSMENT/PLAN   Problem List Items Addressed This Visit       Cardiology Problems   Chronic combined systolic and diastolic CHF (congestive heart failure) (HCC) (Chronic)    EF reduction is probably just progression of disease.  This is exacerbated by her mitral regurgitation.  Symptoms seem to have improved a little bit with the addition of afterload reduction, but she does not have enough blood pressure room to do anymore.  For now we will continue a.m. bisoprolol 2.5 mg only along with spironolactone and and her combination of furosemide/metolazone.  PM will be losartan 25 mg.  Target dry Weight 145 pounds.  I did give her permission to take the medications holiday 1 day out of the weekend.       Relevant Medications   bisoprolol (ZEBETA) 5 MG tablet   losartan (COZAAR) 25 MG tablet   Paroxysmal atrial fibrillation (HCC) (Chronic)    By pacemaker interrogation, 45% A-fib burden.  Certainly this could be contributing to some of her dyspnea, but it does not seem like she has times when it is worse (relatively consistent.  Unfortunately, antiarrhythmics may not be a great option.  She would not want to be hospitalized to consider Tikosyn, and I am somewhat concerned about the possible side effects/intolerance of amiodarone.         Relevant Medications   bisoprolol (ZEBETA) 5 MG tablet   losartan (COZAAR) 25 MG tablet   Essential hypertension (Chronic)    Now borderline hypotensive on low-dose bisoprolol and losartan.  We will have her switch to take  bisoprolol the morning and losartan in the evening.       Relevant Medications   bisoprolol (ZEBETA) 5 MG tablet   losartan (COZAAR) 25 MG tablet   Coronary artery disease involving native heart without angina pectoris (Chronic)    She is not describing any sensation of angina.  Septal wall motion abnormality makes it somewhat concerning, however.  In the absence of active anginal symptoms, I think we can hold off on ischemic evaluation for now.       Relevant Medications   bisoprolol (ZEBETA) 5 MG tablet   losartan (COZAAR) 25 MG tablet   LBBB (left bundle branch block) (Chronic)    Unfortunately combination of left bundle branch block and RV pacing is not helping with septal wall motion.  This is probably part of the reason why her EF is down.  Also not helping her MR.       Relevant Medications   bisoprolol (ZEBETA) 5 MG tablet   losartan (COZAAR) 25 MG tablet   Non-rheumatic mitral regurgitation (Chronic)    Severe mitral and tricuspid regurgitation.  Unfortunately this would indicate the EF of 40 to 45% is actually worse than it truly is.  Certainly A-fib is making this worse as well.  Could consider referral to Beverly. Burt Kim to assess the possibility of MitraClip.  At present, I cannot tell if she would be interested in procedures or not.  Considering goals of care discussions.  Relevant Medications   bisoprolol (ZEBETA) 5 MG tablet   losartan (COZAAR) 25 MG tablet     Other   Secondary hypercoagulable state (HCC) (Chronic)    Longstanding A-fib.  On warfarin now per patient choice. No major bleeding issues.  INR followed by the Coumadin clinic.       Cardiac pacemaker (Chronic)    Unfortunately, she has been ventricular paced with underlying left bundle block now her QRS complexes are much wider.  Certainly not helping her septal wall motion.  Likely given her advanced age, would not consider bridging to CRT-P       ===================================  HPI:    Beverly Kim is a 88 y.o. female with a PMH with longstanding Moderate/Nonischemic CAD, moderate (now severe) TR/MR, LBBB, CHB-PPM, Persistent A-Fib (recently converted from [place] to warfarin), chronic HFpEF-now HFrEF (EF 40 to 45%), below who presents today for 4-6-week follow-up  CARDIO VASCULAR HISTORY: --> Previously followed by Beverly Kim, then Beverly. Percival Kim -> transferred because of other family member being followed as well.  (Her daughter, Beverly Kim is a pt of mine)  CAD: Cardiac Cath 2003: 50-70% LAD, nonischemic Myoview. HFpEF -with Mod MR/TTR December 2013: Echo EF 50 and 55%.  Normal function.-Moderate MR.  Mild LA dilation  November 2015 --> Echo for shortness of breath => reduced EF of 45 to 50%.  Mild LVH.  Inferior HK.  GRII DD.  Moderate MR. Echo 10/2019: EF 60 -65%.  No RWMA.  Severe concentric LVH.  Unable to assess diastolic function.  Mild biatrial dilation.  Mod MR / TR, no AS Echo 06/2021: EF 40-45%. Paradoxical septal WM. Severe MR & TR. Severe Bi Atrial Enlargement. Mild LVH.  CHB/LBBB March 2018--presented with fatigue and dyspnea, 3 AVB --> dual-chamber PPM  F/u Echo 06/2016 (see below) - EF 60-65%.  Afib (Persistent Afib noted on PPM Interrogation) - 08/2016 CHA2DS2-VASc score 5: Age-82, female, HTN, aortic plaque-3. DCCV 01/04/2019 =>  Has since failed rhythm control at least 49 to 50% A. fib burden => she has declined FURTHER DCCV or AAT January 2023: Converted from Xarelto to Coumadin-=> complaints of worsening arthritis pain but the pain etc.  She asked to switch to Coumadin.  \  He was seen on 06/05/2021 - Not doing well - multiple complaints.  Difficulty breathing/ PND/Orthopnea & intermittent edema along with abdominal fullness/ distention.  She had not been taking Zaroxolyn as Rx'ed.  Unaware of +/- Afib.  Reiterated dosing regimen for furosemide and 3 times a week Zaroxolyn.  Discussed sliding scale-set dry weight at  147 pounds. PPM interrogation Plan was to check 2D echo (to reassess EF and valvular disease) and follow-up shortly after  Beverly Kim was last seen on June 19 2021 to discuss results of her echocardiogram and PPM interrogation.  She was still not feeling well.  Not sleeping well difficulty breathing still off-and-on swelling.  Gets short of breath doing despite anything.  Significant fatigue but no chest pain or pressure.  Did not fully understand sliding scale Lasix with Zaroxolyn.  She noted orthopnea PND exercise intolerance and fatigue but no chest pain or pressure.  Trying to maintain dry weight of 147 pounds.  EF noted to be down to 40 to 45% paradoxical septal motion wall motion.  (Related to PPM).  Severe biatrial enlargement.. ? PPM syndrome Furosemide increased to 80 mg Monday Wednesday Friday; '40mg'$  T/Th & Ludwig Clarks + Zaroxolyn; Sunday only 40 mg Lasix. ==> Dry wgt 145 lb. SSL discussed.  Added Losartan for afterload reduction. Consider antiarrhythmic agent, but other than Tikosyn, amiodarone was only other option. Discussed with Beverly. Burt Kim about possible MitraClip (did not seem favorable) Goals of care discussion.  Recent Hospitalizations:  None  Reviewed  CV studies:    The following studies were reviewed today: (if available, images/films reviewed: From Epic Chart or Care Everywhere) None  Interval History:   Beverly Kim presents today noting that she is a little bit better off breathing.  Her weights have been stable she is having a lightheadedness and dizziness in the mornings when she takes both her medications. Still having some mild PND and orthopnea but improved edema.  Still notes fatigue and dyspnea, but somewhat improved.  Despite maintaining stable weight, still symptomatic. External sense of being in or out of A-fib.  1 day last weekend she did not take any of her medications including her p.o. fluid pill, and she felt so much better.  She asked if she can have  a couple more holidays like that.  REVIEWED OF SYSTEMS   Review of Systems  Constitutional:  Positive for malaise/fatigue (Not feeling well - not sleeping well; worn out. / tired). Negative for weight loss.  HENT:  Negative for congestion, nosebleeds and sinus pain.   Respiratory:  Positive for shortness of breath (See HPI). Negative for cough and sputum production.   Cardiovascular:  Positive for orthopnea, leg swelling (Although they do not seem swollen today.) and PND. Negative for palpitations.  Gastrointestinal:  Negative for abdominal pain (Less prominent distention), blood in stool, melena and nausea.  Genitourinary:  Negative for hematuria.  Musculoskeletal:  Positive for joint pain. Negative for falls and myalgias.  Neurological:  Positive for dizziness and weakness. Negative for loss of consciousness.  Psychiatric/Behavioral:  Positive for memory loss. Negative for depression (Just feels exhausted.Marland Kitchen). The patient has insomnia (More related to dyspnea and discomfort.Marland Kitchen). The patient is not nervous/anxious.    I have reviewed and (if needed) personally updated the patient's problem list, medications, allergies, past medical and surgical history, social and family history.   PAST MEDICAL HISTORY   Past Medical History:  Diagnosis Date   Cardiac pacemaker 05/2016   Sick sinus syndrome/complete heart block   Chronic heart failure with preserved ejection fraction (HFpEF) (HCC)    Colon polyps    Diverticulosis    Dyspnea    GERD (gastroesophageal reflux disease)    Hearing loss of both ears    Hepatic cyst    Hiatal hernia    Hyperlipidemia    Hypertension    Hypothyroidism    Low back pain    Non-occlusive coronary artery disease 04/2001   Cardiac Cath 04/18/2001: 50 and 70% mid LAD after D1. = Nonischemic by Myoview.  Medical management.   Other left bundle branch block    PAF (paroxysmal atrial fibrillation) ([place]) 08/16/2016   observed on PPM interrogation, asymptomatic,  chad2vasc score is 5.  Bisoprolol for rate control, Xarelto for anticoagulation.   URI (upper respiratory infection)     PAST SURGICAL HISTORY   Past Surgical History:  Procedure Laterality Date   CARDIOVERSION N/A 01/16/2019   Procedure: CARDIOVERSION;  Surgeon: Josue Hector, MD;  Location: [facility] ENDOSCOPY;  Service: Cardiovascular;  Laterality: N/A;   CHOLECYSTECTOMY N/A 12/15/2019   Procedure: LAPAROSCOPIC CHOLECYSTECTOMY;  Surgeon: Stark Klein, MD;  Location: [place];  Service: General;  Laterality: N/A;   COLONOSCOPY     ERCP N/A 07/21/2019   Procedure: ENDOSCOPIC RETROGRADE CHOLANGIOPANCREATOGRAPHY (ERCP);  Surgeon: Milus Banister, MD;  Location: [facility] ENDOSCOPY;  Service: Endoscopy;  Laterality: N/A;   HERNIA REPAIR     LEFT HEART CATH AND CORONARY ANGIOGRAPHY  04/18/2001   Beverly. Lia Foyer: EF 60 to 65%.  2+ MR.  Upper LM takeoff with 90 degree bend just inside the ostium.  50-70% mid LAD after D1.  D1 is 30%.    Distal LAD wraps the apex and is free of disease.  LCx is mostly large OM branch.  RCA provides PDA and PL branches and Free of disease. -->  Presumably evaluated by Myoview that was nonischemic, therefore no PCI performed.   NM MYOVIEW LTD  01/2012   Normal EF.  Normal study.  No ischemia or infarction.   PACEMAKER IMPLANT Left 05/15/2016   SJM Assirity MRI dual chamber PPM implanted by Beverly Rayann Heman for CHB   REMOVAL OF STONES  07/21/2019   Procedure: REMOVAL OF STONES;  Surgeon: Milus Banister, MD;  Location: [facility] ENDOSCOPY;  Service: Endoscopy;;   SPHINCTEROTOMY  07/21/2019   Procedure: [name];  Surgeon: Milus Banister, MD;  Location: [facility] ENDOSCOPY;  Service: Endoscopy;;   THYROIDECTOMY     TONSILLECTOMY AND ADENOIDECTOMY     TOTAL ABDOMINAL HYSTERECTOMY W/ BILATERAL SALPINGOOPHORECTOMY     TRANSTHORACIC ECHOCARDIOGRAM  06/27/2016   Moderate basal-septal LVH, EF 60 to 65%.  No R WMA.  GR 1 DD.  Mild aortic valve calcification.  No AS.  Trivial MR.--Indicated improved  EF compared to prior study in 2015.   TRANSTHORACIC ECHOCARDIOGRAM  02/22/2012   12/'13 -a) EF 50 and 55%.  Normal function.-Moderate MR.  Mild LA dilation.; b) 11/2015Echo for shortness of breath => reduced EF of 45 to 50%.  Mild LVH.  Inferior reversible HK.  GRII DD.  Moderate MR.   TRANSTHORACIC ECHOCARDIOGRAM  10/2019   EF 60 -65%.  No RWMA.  Severe concentric LVH.  Unable to assess diastolic function.  Mild biatrial dilation.  Moderate MR moderate TR, no AS   TRANSTHORACIC ECHOCARDIOGRAM  06/15/2021   EF 40 to 45%.  Paradoxical septal motion from pacemaker.  Mild concentric LVH.  Indeterminate diastolic pressure.  Severe biatrial enlargement would argue significant diastolic dysfunction.  Severe MR and severe TR. => EF down from 60 to 65%.    Immunization History  Administered Date(s) Administered   Influenza Split 12/25/2010, 12/25/2011   Influenza Whole 03/05/2005, 12/13/2008   Influenza, High Dose Seasonal PF 12/16/2012, 01/15/2017   PFIZER(Purple Top)SARS-COV-2 Vaccination 05/03/2019, 06/03/2019   Pneumococcal Polysaccharide-23 03/05/2002, 08/12/2007   Td 03/06/2003   Zoster, Live 08/12/2007    MEDICATIONS/ALLERGIES   Current Meds  Medication Sig   acetaminophen (TYLENOL) 500 MG tablet Take 500 mg by mouth every 6 (six) hours as needed.   acetaminophen (TYLENOL) 650 MG CR tablet Take 650 mg by mouth every 8 (eight) hours as needed for pain.   albuterol (VENTOLIN HFA) 108 (90 Base) MCG/ACT inhaler Inhale 1-2 puffs into the lungs every 6 (six) hours as needed for wheezing or shortness of breath.   furosemide (LASIX) 40 MG tablet Take 80 mg  ( 2 tablet) on Monday, Wednesday, Friday   Melatonin 5 MG CAPS Take 5 mg by mouth at bedtime as needed (sleep).   metolazone (ZAROXOLYN) 2.5 MG tablet TAKE 1 TABLET BY MOUTH THREE TIMES A WEEK (Patient taking differently: TAKE 1 TABLET BY MOUTH ONCE A WEEK)   ondansetron (ZOFRAN-ODT) 4 MG disintegrating tablet Take 4 mg by mouth every 8  (eight) hours  as needed.   pantoprazole (PROTONIX) 40 MG tablet Take 1 tablet (40 mg total) by mouth 2 (two) times daily before a meal for 14 days, THEN 1 tablet (40 mg total) daily.   potassium chloride SA (KLOR-CON M) 20 MEQ tablet Take 1 tablet (20 mEq total) by mouth 2 (two) times daily for 5 days.   spironolactone (ALDACTONE) 25 MG tablet Take 0.5 tablets (12.5 mg total) by mouth daily.   SYNTHROID 100 MCG tablet TAKE 1 TABLET BY MOUTH EVERY DAY BEFORE BREAKFAST   warfarin (COUMADIN) 2.5 MG tablet TAKE 1-2 TABLETS DAILY OR AS PRESCRIBED BY COUMADIN CLINIC   [DISCONTINUED] bisoprolol (ZEBETA) 5 MG tablet Take 0.5 tablets (2.5 mg total) by mouth 2 (two) times daily.   [DISCONTINUED] losartan (COZAAR) 25 MG tablet Take 1 tablet (25 mg total) by mouth daily.     Allergies  Allergen Reactions   Adhesive [Tape] Itching, Dermatitis, Rash and Other (See Comments)    Blisters and "skin bubbles"   Ace Inhibitors Cough   Atorvastatin Other (See Comments)    REACTION: Reaction not known   Clindamycin Other (See Comments)    Unknown   Codeine Other (See Comments)    hallucinations    Latex Other (See Comments)    blisters   Shellfish Allergy Other (See Comments)    "gallbladder attack"   Simvastatin Other (See Comments)    fatigue   Penicillins Rash    Has patient had a PCN reaction causing immediate rash, facial/tongue/throat swelling, SOB or lightheadedness with hypotension: Unknown Has patient had a PCN reaction causing severe rash involving mucus membranes or skin necrosis: Unknown Has patient had a PCN reaction that required hospitalization: Unknown Has patient had a PCN reaction occurring within the last 10 years: No If all of the above answers are "NO", then may proceed with Cephalosporin use.     SOCIAL HISTORY/FAMILY HISTORY   Reviewed in Epic:  Pertinent findings:  Social History   Tobacco Use   Smoking status: Never   Smokeless tobacco: Never  Vaping Use   Vaping  Use: Never used  Substance Use Topics   Alcohol use: No   Drug use: No   Social History   Social History Narrative   Not on file    OBJCTIVE -PE, EKG, labs   Wt Readings from Last 3 Encounters:  07/26/21 150 lb 9.6 oz (68.3 kg)  07/25/21 148 lb 12.8 oz (67.5 kg)  06/21/21 148 lb (67.1 kg)    Physical Exam: BP (!) 104/52   Pulse 70   Ht '5\' 2"'$  (1.575 m)   Wt 148 lb 12.8 oz (67.5 kg)   SpO2 96%   BMI 27.22 kg/m  Physical Exam Vitals reviewed.  Constitutional:      General: She is not in acute distress.    Appearance: She is normal weight. She is ill-appearing (Still multiple complaints, still chronically ill-appearing, but nontoxic.). She is not toxic-appearing.  HENT:     Head: Normocephalic and atraumatic.     Ears:     Comments: Extremely hard of hearing. Neck:     Vascular: Hepatojugular reflux and JVD (8 to 10 cm water) present. No carotid bruit.  Cardiovascular:     Rate and Rhythm: Normal rate and regular rhythm. No extrasystoles are present.    Chest Wall: PMI is not displaced.     Pulses: Decreased pulses (Diminished, palpable.  Warm feet).     Heart sounds: S1 normal and S2 normal. Heart sounds are  distant. No midsystolic click. Murmur heard.  High-pitched blowing holosystolic murmur is present with a grade of 1/6 at the lower left sternal border and apex radiating to the axilla and apex.    No friction rub. Gallop present. S4 sounds present.     Comments: Normal S1 with split S2;; Cannot exclude S4 Pulmonary:     Effort: Pulmonary effort is normal.     Breath sounds: No rhonchi.     Comments: Mild interstitial basal lung sounds, but otherwise CTA B.  Nonlabored. Chest:     Chest wall: No tenderness.  Abdominal:     General: Bowel sounds are normal. There is distension.     Palpations: There is no mass (No HSM, or bruits).     Tenderness: There is no abdominal tenderness.     Comments: Somewhat distended, protuberant.  Cannot appreciate ascites, but she  says that her abdomen is more protuberant than usual.  Musculoskeletal:        General: Swelling (Trivial ankle swelling) present.     Cervical back: Normal range of motion and neck supple.  Skin:    General: Skin is warm and dry.     Coloration: Skin is not pale.  Neurological:     General: No focal deficit present.     Mental Status: She is alert and oriented to person, place, and time.     Motor: No weakness.     Gait: Gait abnormal.     Comments: Notable memory loss  Psychiatric:     Comments: She seems somewhat down, upset, depressed mood today. => Her son died of an MI on 05/13/22. Somewhat tangential speech.  Partly because she is so hard of hearing.  Multiple complaints, just not feeling well.    Adult ECG Report Not checked  Recent Labs:  reviewded  Lab Results  Component Value Date   CREATININE 1.19 06/21/2021   BUN 43 (H) 06/21/2021   NA 135 06/21/2021   K 3.2 (L) 06/21/2021   CL 94 (L) 06/21/2021   CO2 30 06/21/2021   Lab Results  Component Value Date   CHOL 144 01/04/2021   HDL 43.60 01/04/2021   LDLCALC 86 01/04/2021   LDLDIRECT 163.3 01/30/2012   TRIG 75.0 01/04/2021   CHOLHDL 3 01/04/2021      Latest Ref Rng & Units 06/21/2021    3:51 PM 01/04/2021    3:28 PM 06/29/2020    3:43 PM  CBC  WBC 4.0 - 10.5 K/uL 5.5   7.4   5.8    Hemoglobin 12.0 - 15.0 g/dL 13.4   13.5   12.0    Hematocrit 36.0 - 46.0 % 39.6   41.1   35.4    Platelets 150.0 - 400.0 K/uL 156.0   144.0   161.0     Lab Results  Component Value Date   HGBA1C 6.4 01/04/2021   Lab Results  Component Value Date   TSH 3.45 06/21/2021    ==================================================  COVID-19 Education: The signs and symptoms of COVID-19 were discussed with the patient and how to seek care for testing (follow up with PCP or arrange E-visit).    I spent a total of 27  minutes with the patient spent in direct patient consultation.   Additional time spent with chart review  /  charting (studies, outside notes, etc): 25 min Total Time: 52 min  Current medicines are reviewed at length with the patient today.  (+/- concerns) nA  Notice: This dictation  was prepared with Dragon dictation along with smart phrase technology. Any transcriptional errors that result from this process are unintentional and may not be corrected upon review.  Studies Ordered:   No orders of the defined types were placed in this encounter.  Meds ordered this encounter  Medications   bisoprolol (ZEBETA) 5 MG tablet    Sig: Take 0.5 tablets (2.5 mg total) by mouth daily.    Dispense:  90 tablet    Refill:  3    Changing directions   losartan (COZAAR) 25 MG tablet    Sig: Take 1 tablet (25 mg total) by mouth every evening.    Dispense:  90 tablet    Refill:  3     Patient Instructions / Medication Changes & Studies & Tests Ordered   Patient Instructions  Medication Instructions:    Take Bisoprolol 2.5 mg in the morning only.  Take Losartan 25 mg in the evening only.  It is okay not take your diuretic one day of the weekend as long as you are not short of breath.    Dry weight  should be 145 lbs   *If you need a refill on your cardiac medications before your next appointment, please call your pharmacy*   Lab Work:  Not needed   Testing/Procedures: Not needed    Follow-Up: At [facility], you and your health needs are our priority.  As part of our continuing mission to provide you with exceptional heart care, we have created designated Provider Care Teams.  These Care Teams include your primary Cardiologist (physician) and Advanced Practice Providers (APPs -  Physician Assistants and Nurse Practitioners) who all work together to provide you with the care you need, when you need it.     Your next appointment:   5 month(s)  The format for your next appointment:   In Person  Provider:   Glenetta Hew, MD      Beverly Kim, M.D., M.S. Interventional  Cardiologist   Pager # [phone] Phone # [phone] [address] [place], [place] [address]   Thank you for choosing Heartcare at [facility]!!

## 2021-07-25 NOTE — Patient Instructions (Addendum)
Medication Instructions:    Take Bisoprolol 2.5 mg in the morning only.  Take Losartan 25 mg in the evening only.  It is okay not take your diuretic one day of the weekend as long as you are not short of breath.    Dry weight  should be 145 lbs   *If you need a refill on your cardiac medications before your next appointment, please call your pharmacy*   Lab Work:  Not needed   Testing/Procedures: Not needed    Follow-Up: At [facility], you and your health needs are our priority.  As part of our continuing mission to provide you with exceptional heart care, we have created designated Provider Care Teams.  These Care Teams include your primary Cardiologist (physician) and Advanced Practice Providers (APPs -  Physician Assistants and Nurse Practitioners) who all work together to provide you with the care you need, when you need it.     Your next appointment:   5 month(s)  The format for your next appointment:   In Person  Provider:   Glenetta Hew, MD

## 2021-07-26 ENCOUNTER — Encounter: Payer: Self-pay | Admitting: Internal Medicine

## 2021-07-26 ENCOUNTER — Ambulatory Visit: Payer: PPO | Admitting: Internal Medicine

## 2021-07-26 VITALS — BP 106/60 | HR 70 | Ht 62.0 in | Wt 150.6 lb

## 2021-07-26 DIAGNOSIS — I442 Atrioventricular block, complete: Secondary | ICD-10-CM | POA: Diagnosis not present

## 2021-07-26 DIAGNOSIS — I4819 Other persistent atrial fibrillation: Secondary | ICD-10-CM

## 2021-07-26 NOTE — Progress Notes (Signed)
PCP: [name], MD Primary Cardiologist:   Dr Ellyn Hack Primary EP:  Dr Rayann Heman  Beverly Kim is a 88 y.o. female who presents today for routine electrophysiology followup.  She has multiple noncardiac complaints today, including nausea and feeling "Tired".  She says has not seen her PCP in quite some time. She is not very active.  Today, she denies symptoms of palpitations, chest pain, shortness of breath,  lower extremity edema, dizziness, presyncope, or syncope.  The patient is otherwise without complaint today.   Past Medical History:  Diagnosis Date   Cardiac pacemaker 05/2016   Sick sinus syndrome/complete heart block   Chronic heart failure with preserved ejection fraction (HFpEF) (HCC)    Colon polyps    Diverticulosis    Dyspnea    GERD (gastroesophageal reflux disease)    Hearing loss of both ears    Hepatic cyst    Hiatal hernia    Hyperlipidemia    Hypertension    Hypothyroidism    Low back pain    Non-occlusive coronary artery disease 04/2001   Cardiac Cath 04/18/2001: 50 and 70% mid LAD after D1. = Nonischemic by Myoview.  Medical management.   Other left bundle branch block    PAF (paroxysmal atrial fibrillation) ([place]) 08/16/2016   observed on PPM interrogation, asymptomatic, chad2vasc score is 5.  Bisoprolol for rate control, Xarelto for anticoagulation.   URI (upper respiratory infection)    Past Surgical History:  Procedure Laterality Date   CARDIOVERSION N/A 01/16/2019   Procedure: CARDIOVERSION;  Surgeon: Josue Hector, MD;  Location: [facility] ENDOSCOPY;  Service: Cardiovascular;  Laterality: N/A;   CHOLECYSTECTOMY N/A 12/15/2019   Procedure: LAPAROSCOPIC CHOLECYSTECTOMY;  Surgeon: Stark Klein, MD;  Location: [facility];  Service: General;  Laterality: N/A;   COLONOSCOPY     ERCP N/A 07/21/2019   Procedure: ENDOSCOPIC RETROGRADE CHOLANGIOPANCREATOGRAPHY (ERCP);  Surgeon: Milus Banister, MD;  Location: [facility] ENDOSCOPY;  Service: Endoscopy;  Laterality: N/A;    HERNIA REPAIR     LEFT HEART CATH AND CORONARY ANGIOGRAPHY  04/18/2001   Dr. Lia Foyer: EF 60 to 65%.  2+ MR.  Upper LM takeoff with 90 degree bend just inside the ostium.  50-70% mid LAD after D1.  D1 is 30%.    Distal LAD wraps the apex and is free of disease.  LCx is mostly large OM branch.  RCA provides PDA and PL branches and Free of disease. -->  Presumably evaluated by Myoview that was nonischemic, therefore no PCI performed.   NM MYOVIEW LTD  01/2012   Normal EF.  Normal study.  No ischemia or infarction.   PACEMAKER IMPLANT Left 05/15/2016   SJM Assirity MRI dual chamber PPM implanted by Dr Rayann Heman for CHB   REMOVAL OF STONES  07/21/2019   Procedure: REMOVAL OF STONES;  Surgeon: Milus Banister, MD;  Location: [facility] ENDOSCOPY;  Service: Endoscopy;;   SPHINCTEROTOMY  07/21/2019   Procedure: [name];  Surgeon: Milus Banister, MD;  Location: [facility] ENDOSCOPY;  Service: Endoscopy;;   THYROIDECTOMY     TONSILLECTOMY AND ADENOIDECTOMY     TOTAL ABDOMINAL HYSTERECTOMY W/ BILATERAL SALPINGOOPHORECTOMY     TRANSTHORACIC ECHOCARDIOGRAM  06/27/2016   Moderate basal-septal LVH, EF 60 to 65%.  No R WMA.  GR 1 DD.  Mild aortic valve calcification.  No AS.  Trivial MR.--Indicated improved EF compared to prior study in 2015.   TRANSTHORACIC ECHOCARDIOGRAM  02/22/2012   12/'13 -a) EF 50 and 55%.  Normal  function.-Moderate MR.  Mild LA dilation.; b) 11/2015Echo for shortness of breath => reduced EF of 45 to 50%.  Mild LVH.  Inferior reversible HK.  GRII DD.  Moderate MR.   TRANSTHORACIC ECHOCARDIOGRAM  10/2019   EF 60 -65%.  No RWMA.  Severe concentric LVH.  Unable to assess diastolic function.  Mild biatrial dilation.  Moderate MR moderate TR, no AS   TRANSTHORACIC ECHOCARDIOGRAM  06/15/2021   EF 40 to 45%.  Paradoxical septal motion from pacemaker.  Mild concentric LVH.  Indeterminate diastolic pressure.  Severe biatrial enlargement would argue significant diastolic dysfunction.  Severe MR and  severe TR. => EF down from 60 to 65%.    ROS- all systems are reviewed and negative except as per HPI above  Current Outpatient Medications  Medication Sig Dispense Refill   acetaminophen (TYLENOL) 500 MG tablet Take 500 mg by mouth every 6 (six) hours as needed.     acetaminophen (TYLENOL) 650 MG CR tablet Take 650 mg by mouth every 8 (eight) hours as needed for pain.     albuterol (VENTOLIN HFA) 108 (90 Base) MCG/ACT inhaler Inhale 1-2 puffs into the lungs every 6 (six) hours as needed for wheezing or shortness of breath. 1 each 2   bisoprolol (ZEBETA) 5 MG tablet Take 0.5 tablets (2.5 mg total) by mouth daily. 90 tablet 3   furosemide (LASIX) 40 MG tablet Take 80 mg  ( 2 tablet) on Monday, Wednesday, Friday 90 tablet 3   losartan (COZAAR) 25 MG tablet Take 1 tablet (25 mg total) by mouth every evening. 90 tablet 3   Melatonin 5 MG CAPS Take 5 mg by mouth at bedtime as needed (sleep).     metolazone (ZAROXOLYN) 2.5 MG tablet TAKE 1 TABLET BY MOUTH THREE TIMES A WEEK (Patient taking differently: TAKE 1 TABLET BY MOUTH ONCE A WEEK) 30 tablet 10   ondansetron (ZOFRAN-ODT) 4 MG disintegrating tablet Take 4 mg by mouth every 8 (eight) hours as needed.     pantoprazole (PROTONIX) 40 MG tablet Take 1 tablet (40 mg total) by mouth 2 (two) times daily before a meal for 14 days, THEN 1 tablet (40 mg total) daily. 104 tablet 0   spironolactone (ALDACTONE) 25 MG tablet Take 0.5 tablets (12.5 mg total) by mouth daily. 30 tablet 3   SYNTHROID 100 MCG tablet TAKE 1 TABLET BY MOUTH EVERY DAY BEFORE BREAKFAST 90 tablet 1   warfarin (COUMADIN) 2.5 MG tablet TAKE 1-2 TABLETS DAILY OR AS PRESCRIBED BY COUMADIN CLINIC 75 tablet 0   potassium chloride SA (KLOR-CON M) 20 MEQ tablet Take 1 tablet (20 mEq total) by mouth 2 (two) times daily for 5 days. 10 tablet 0   No current facility-administered medications for this visit.    Physical Exam: Vitals:   07/26/21 1553  BP: 106/60  Pulse: 70  Weight: 150 lb 9.6  oz (68.3 kg)  Height: '5\' 2"'$  (1.575 m)    GEN- The patient is elderly and frail appearing, alert and oriented x 3 today.   Head- normocephalic, atraumatic Eyes-  Sclera clear, conjunctiva pink Ears- hearing intact Oropharynx- clear Lungs- Clear to ausculation bilaterally, normal work of breathing Chest- pacemaker pocket is well healed Heart- Regular rate and rhythm (paced) GI- soft  Extremities- no clubbing, cyanosis, or edema  Pacemaker interrogation- reviewed in detail today,  See PACEART report  ekg tracing ordered today is personally reviewed and shows afib, V paced  Assessment and Plan:  1. Symptomatic complete heart block  Normal pacemaker function See Claudia Desanctis Art report Lower rate during afib adjusted to 60 bpm she is not device dependant today She was very symptomatic with RV sensing test today.  2. HTN Stable No change required today  3. Persistent atrial fibrillation Well controlled (burden 45%) She is on coumadin  4. Chronic diastolic dysfunction Stable No change required today  5. CAD No ischemic symptoms  Return in a year to see EP APP Follow closely with Dr Ellyn Hack in the interim.  [name] MD, [facility] 07/26/2021 3:54 PM

## 2021-07-26 NOTE — Patient Instructions (Signed)
Medication Instructions:  Your physician recommends that you continue on your current medications as directed. Please refer to the Current Medication list given to you today.  *If you need a refill on your cardiac medications before your next appointment, please call your pharmacy*   Lab Work: None ordered If you have labs (blood work) drawn today and your tests are completely normal, you will receive your results only by: [place] (if you have MyChart) OR A paper copy in the mail If you have any lab test that is abnormal or we need to change your treatment, we will call you to review the results.   Testing/Procedures: None ordered   Follow-Up: At [facility], you and your health needs are our priority.  As part of our continuing mission to provide you with exceptional heart care, we have created designated Provider Care Teams.  These Care Teams include your primary Cardiologist (physician) and Advanced Practice Providers (APPs -  Physician Assistants and Nurse Practitioners) who all work together to provide you with the care you need, when you need it.  We recommend signing up for the patient portal called "MyChart".  Sign up information is provided on this After Visit Summary.  MyChart is used to connect with patients for Virtual Visits (Telemedicine).  Patients are able to view lab/test results, encounter notes, upcoming appointments, etc.  Non-urgent messages can be sent to your provider as well.   To learn more about what you can do with MyChart, go to [email or web address].    Remote monitoring is used to monitor your Pacemaker or ICD from home. This monitoring reduces the number of office visits required to check your device to one time per year. It allows [place] to keep an eye on the functioning of your device to ensure it is working properly. You are scheduled for a device check from home on 09/11/2021. You may send your transmission at any time that day. If you have a  wireless device, the transmission will be sent automatically. After your physician reviews your transmission, you will receive a postcard with your next transmission date.  Your next appointment:   12 month(s)  The format for your next appointment:   In Person  Provider:   Tommye Standard, PA-C    Thank you for choosing [facility]!!   Forde Dandy, RN [phone]    Other Instructions   Important Information About Sugar

## 2021-08-01 ENCOUNTER — Ambulatory Visit: Payer: PPO | Admitting: Cardiology

## 2021-08-01 ENCOUNTER — Encounter: Payer: Self-pay | Admitting: Cardiology

## 2021-08-01 NOTE — Assessment & Plan Note (Signed)
By pacemaker interrogation, 45% A-fib burden.  Certainly this could be contributing to some of her dyspnea, but it does not seem like she has times when it is worse (relatively consistent.  Unfortunately, antiarrhythmics may not be a great option.  She would not want to be hospitalized to consider Tikosyn, and I am somewhat concerned about the possible side effects/intolerance of amiodarone.

## 2021-08-01 NOTE — Progress Notes (Incomplete)
Primary Care Provider: [name], MD Cardiologist: Glenetta Hew, MD Electrophysiologist: None  Clinic Note: No chief complaint on file. None Home none ===================================  ASSESSMENT/PLAN   Problem List Items Addressed This Visit   None ===================================  HPI:    Beverly Kim is a 88 y.o. female with a PMH with longstanding Moderate/Nonischemic CAD, moderate (now severe) TR/MR, LBBB, CHB-PPM, Persistent A-Fib (recently converted from [place] to warfarin), chronic HFpEF-now HFrEF (EF 40 to 45%), below who presents today for 4-6-week follow-up  CARDIO VASCULAR HISTORY: --> Previously followed by Dr Verl Blalock, then Dr. Percival Spanish -> transferred because of other family member being followed as well.  (Her daughter, Beverly Kim is a pt of mine)  CAD: Cardiac Cath 2003: 50-70% LAD, nonischemic Myoview. HFpEF -with Mod MR/TTR December 2013: Echo EF 50 and 55%.  Normal function.-Moderate MR.  Mild LA dilation  November 2015 --> Echo for shortness of breath => reduced EF of 45 to 50%.  Mild LVH.  Inferior HK.  GRII DD.  Moderate MR. Echo 10/2019: EF 60 -65%.  No RWMA.  Severe concentric LVH.  Unable to assess diastolic function.  Mild biatrial dilation.  Mod MR / TR, no AS Echo 06/2021: EF 40-45%. Paradoxical septal WM. Severe MR & TR. Severe Bi Atrial Enlargement. Mild LVH.  CHB/LBBB March 2018--presented with fatigue and dyspnea, 3 AVB --> dual-chamber PPM  F/u Echo 06/2016 (see below) - EF 60-65%.  Afib (Persistent Afib noted on PPM Interrogation) - 08/2016 CHA2DS2-VASc score 5: Age-53, female, HTN, aortic plaque-3. DCCV 01/04/2019 =>  Has since failed rhythm control at least 49 to 50% A. fib burden => she has declined FURTHER DCCV or AAT January 2023: Converted from Xarelto to Coumadin-=> complaints of worsening arthritis pain but the pain etc.  She asked to switch to Coumadin.  \  He was seen on 06/05/2021 - Not doing well - multiple complaints.   Difficulty breathing/ PND/Orthopnea & intermittent edema along with abdominal fullness/ distention.  She had not been taking Zaroxolyn as Rx'ed.  Unaware of +/- Afib.  Reiterated dosing regimen for furosemide and 3 times a week Zaroxolyn.  Discussed sliding scale-set dry weight at 147 pounds. PPM interrogation Plan was to check 2D echo (to reassess EF and valvular disease) and follow-up shortly after  Beverly Kim was last seen on June 19 2021 to discuss results of her echocardiogram and PPM interrogation.  She was still not feeling well.  Not sleeping well difficulty breathing still off-and-on swelling.  Gets short of breath doing despite anything.  Significant fatigue but no chest pain or pressure.  Did not fully understand sliding scale Lasix with Zaroxolyn.  She noted orthopnea PND exercise intolerance and fatigue but no chest pain or pressure.  Trying to maintain dry weight of 147 pounds.  EF noted to be down to 40 to 45% paradoxical septal motion wall motion.  (Related to PPM).  Severe biatrial enlargement.. ? PPM syndrome Furosemide increased to 80 mg Monday Wednesday Friday; '40mg'$  T/Th & Ludwig Clarks + Zaroxolyn; Sunday only 40 mg Lasix. ==> Dry wgt 145 lb. SSL discussed.  Added Losartan for afterload reduction. Consider antiarrhythmic agent, but other than Tikosyn, amiodarone was only other option. Discussed with Dr. [name] about possible MitraClip (did not seem favorable) Goals of care discussion.  Recent Hospitalizations:  None  Reviewed  CV studies:    The following studies were reviewed today: (if available, images/films reviewed: From Epic Chart or Care Everywhere) None  Interval History:  Beverly Kim presents today noting that she is a little bit better off breathing.  Her weights have been stable she is having a lightheadedness and dizziness in the mornings when she takes both her medications. Still having some mild PND and orthopnea but improved edema.  Still notes fatigue and  dyspnea, but somewhat improved.  Despite maintaining stable weight, still symptomatic. External sense of being in or out of A-fib.  1 day last weekend she did not take any of her medications including her p.o. fluid pill, and she felt so much better.  She asked if she can have a couple more holidays like that.  REVIEWED OF SYSTEMS   Review of Systems  Constitutional:  Positive for malaise/fatigue (Not feeling well - not sleeping well; worn out. / tired). Negative for weight loss.  HENT:  Negative for congestion, nosebleeds and sinus pain.   Respiratory:  Positive for shortness of breath (See HPI). Negative for cough and sputum production.   Cardiovascular:  Positive for orthopnea, leg swelling (Although they do not seem swollen today.) and PND. Negative for palpitations.  Gastrointestinal:  Negative for abdominal pain (Less prominent distention), blood in stool, melena and nausea.  Genitourinary:  Negative for hematuria.  Musculoskeletal:  Positive for joint pain. Negative for falls and myalgias.  Neurological:  Positive for dizziness and weakness. Negative for loss of consciousness.  Psychiatric/Behavioral:  Positive for memory loss. Negative for depression (Just feels exhausted.Marland Kitchen). The patient has insomnia (More related to dyspnea and discomfort.Marland Kitchen). The patient is not nervous/anxious.    I have reviewed and (if needed) personally updated the patient's problem list, medications, allergies, past medical and surgical history, social and family history.   PAST MEDICAL HISTORY   Past Medical History:  Diagnosis Date  . Cardiac pacemaker 05/2016   Sick sinus syndrome/complete heart block  . Chronic heart failure with preserved ejection fraction (HFpEF) ([place])   . Colon polyps   . Diverticulosis   . Dyspnea   . GERD (gastroesophageal reflux disease)   . Hearing loss of both ears   . Hepatic cyst   . Hiatal hernia   . Hyperlipidemia   . Hypertension   . Hypothyroidism   . Low back  pain   . Non-occlusive coronary artery disease 04/2001   Cardiac Cath 04/18/2001: 50 and 70% mid LAD after D1. = Nonischemic by Myoview.  Medical management.  . Other left bundle branch block   . PAF (paroxysmal atrial fibrillation) ([place]) 08/16/2016   observed on PPM interrogation, asymptomatic, chad2vasc score is 5.  Bisoprolol for rate control, Xarelto for anticoagulation.  Marland Kitchen URI (upper respiratory infection)     PAST SURGICAL HISTORY   Past Surgical History:  Procedure Laterality Date  . CARDIOVERSION N/A 01/16/2019   Procedure: CARDIOVERSION;  Surgeon: Josue Hector, MD;  Location[facility] ENDOSCOPY;  Service: Cardiovascular;  Laterality: N/A;  . CHOLECYSTECTOMY N/A 12/15/2019   Procedure: LAPAROSCOPIC CHOLECYSTECTOMY;  Surgeon: Stark Klein, MD;  Location: [facility];  Service: General;  Laterality: N/A;  . COLONOSCOPY    . ERCP N/A 07/21/2019   Procedure: ENDOSCOPIC RETROGRADE CHOLANGIOPANCREATOGRAPHY (ERCP);  Surgeon: Milus Banister, MD;  Location: [facility] ENDOSCOPY;  Service: Endoscopy;  Laterality: N/A;  . HERNIA REPAIR    . LEFT HEART CATH AND CORONARY ANGIOGRAPHY  04/18/2001   Dr. Lia Foyer: EF 60 to 65%.  2+ MR.  Upper LM takeoff with 90 degree bend just inside the ostium.  50-70% mid LAD after D1.  D1 is 30%.  Distal LAD wraps the apex and is free of disease.  LCx is mostly large OM branch.  RCA provides PDA and PL branches and Free of disease. -->  Presumably evaluated by Myoview that was nonischemic, therefore no PCI performed.  Marland Kitchen NM MYOVIEW LTD  01/2012   Normal EF.  Normal study.  No ischemia or infarction.  Marland Kitchen PACEMAKER IMPLANT Left 05/15/2016   SJM Assirity MRI dual chamber PPM implanted by Dr Rayann Heman for CHB  . REMOVAL OF STONES  07/21/2019   Procedure: REMOVAL OF STONES;  Surgeon: Milus Banister, MD;  Location: [facility] ENDOSCOPY;  Service: Endoscopy;;  . [name]  07/21/2019   Procedure: [name];  Surgeon: Milus Banister, MD;  Location: [facility] ENDOSCOPY;  Service:  Endoscopy;;  . THYROIDECTOMY    . TONSILLECTOMY AND ADENOIDECTOMY    . TOTAL ABDOMINAL HYSTERECTOMY W/ BILATERAL SALPINGOOPHORECTOMY    . TRANSTHORACIC ECHOCARDIOGRAM  06/27/2016   Moderate basal-septal LVH, EF 60 to 65%.  No R WMA.  GR 1 DD.  Mild aortic valve calcification.  No AS.  Trivial MR.--Indicated improved EF compared to prior study in 2015.  Marland Kitchen TRANSTHORACIC ECHOCARDIOGRAM  02/22/2012   12/'13 -a) EF 50 and 55%.  Normal function.-Moderate MR.  Mild LA dilation.; b) 11/2015Echo for shortness of breath => reduced EF of 45 to 50%.  Mild LVH.  Inferior reversible HK.  GRII DD.  Moderate MR.  . TRANSTHORACIC ECHOCARDIOGRAM  10/2019   EF 60 -65%.  No RWMA.  Severe concentric LVH.  Unable to assess diastolic function.  Mild biatrial dilation.  Moderate MR moderate TR, no AS  . TRANSTHORACIC ECHOCARDIOGRAM  06/15/2021   EF 40 to 45%.  Paradoxical septal motion from pacemaker.  Mild concentric LVH.  Indeterminate diastolic pressure.  Severe biatrial enlargement would argue significant diastolic dysfunction.  Severe MR and severe TR. => EF down from 60 to 65%.    Immunization History  Administered Date(s) Administered  . Influenza Split 12/25/2010, 12/25/2011  . Influenza Whole 03/05/2005, 12/13/2008  . Influenza, High Dose Seasonal PF 12/16/2012, 01/15/2017  . PFIZER(Purple Top)SARS-COV-2 Vaccination 05/03/2019, 06/03/2019  . Pneumococcal Polysaccharide-23 03/05/2002, 08/12/2007  . Td 03/06/2003  . Zoster, Live 08/12/2007    MEDICATIONS/ALLERGIES   Current Meds  Medication Sig  . acetaminophen (TYLENOL) 500 MG tablet Take 500 mg by mouth every 6 (six) hours as needed.  Marland Kitchen acetaminophen (TYLENOL) 650 MG CR tablet Take 650 mg by mouth every 8 (eight) hours as needed for pain.  Marland Kitchen albuterol (VENTOLIN HFA) 108 (90 Base) MCG/ACT inhaler Inhale 1-2 puffs into the lungs every 6 (six) hours as needed for wheezing or shortness of breath.  . furosemide (LASIX) 40 MG tablet Take 80 mg  ( 2  tablet) on Monday, Wednesday, Friday  . Melatonin 5 MG CAPS Take 5 mg by mouth at bedtime as needed (sleep).  . metolazone (ZAROXOLYN) 2.5 MG tablet TAKE 1 TABLET BY MOUTH THREE TIMES A WEEK (Patient taking differently: TAKE 1 TABLET BY MOUTH ONCE A WEEK)  . ondansetron (ZOFRAN-ODT) 4 MG disintegrating tablet Take 4 mg by mouth every 8 (eight) hours as needed.  . pantoprazole (PROTONIX) 40 MG tablet Take 1 tablet (40 mg total) by mouth 2 (two) times daily before a meal for 14 days, THEN 1 tablet (40 mg total) daily.  . potassium chloride SA (KLOR-CON M) 20 MEQ tablet Take 1 tablet (20 mEq total) by mouth 2 (two) times daily for 5 days.  Marland Kitchen spironolactone (ALDACTONE) 25 MG tablet Take  0.5 tablets (12.5 mg total) by mouth daily.  Marland Kitchen SYNTHROID 100 MCG tablet TAKE 1 TABLET BY MOUTH EVERY DAY BEFORE BREAKFAST  . warfarin (COUMADIN) 2.5 MG tablet TAKE 1-2 TABLETS DAILY OR AS PRESCRIBED BY COUMADIN CLINIC  . [DISCONTINUED] bisoprolol (ZEBETA) 5 MG tablet Take 0.5 tablets (2.5 mg total) by mouth 2 (two) times daily.  . [DISCONTINUED] losartan (COZAAR) 25 MG tablet Take 1 tablet (25 mg total) by mouth daily.     Allergies  Allergen Reactions  . Adhesive [Tape] Itching, Dermatitis, Rash and Other (See Comments)    Blisters and "skin bubbles"  . Ace Inhibitors Cough  . Atorvastatin Other (See Comments)    REACTION: Reaction not known  . Clindamycin Other (See Comments)    Unknown  . Codeine Other (See Comments)    hallucinations   . Latex Other (See Comments)    blisters  . Shellfish Allergy Other (See Comments)    "gallbladder attack"  . Simvastatin Other (See Comments)    fatigue  . Penicillins Rash    Has patient had a PCN reaction causing immediate rash, facial/tongue/throat swelling, SOB or lightheadedness with hypotension: Unknown Has patient had a PCN reaction causing severe rash involving mucus membranes or skin necrosis: Unknown Has patient had a PCN reaction that required  hospitalization: Unknown Has patient had a PCN reaction occurring within the last 10 years: No If all of the above answers are "NO", then may proceed with Cephalosporin use.     SOCIAL HISTORY/FAMILY HISTORY   Reviewed in Epic:  Pertinent findings:  Social History   Tobacco Use  . Smoking status: Never  . Smokeless tobacco: Never  Vaping Use  . Vaping Use: Never used  Substance Use Topics  . Alcohol use: No  . Drug use: No   Social History   Social History Narrative  . Not on file    OBJCTIVE -PE, EKG, labs   Wt Readings from Last 3 Encounters:  07/26/21 150 lb 9.6 oz (68.3 kg)  07/25/21 148 lb 12.8 oz (67.5 kg)  06/21/21 148 lb (67.1 kg)    Physical Exam: BP (!) 104/52   Pulse 70   Ht '5\' 2"'$  (1.575 m)   Wt 148 lb 12.8 oz (67.5 kg)   SpO2 96%   BMI 27.22 kg/m  Physical Exam Vitals reviewed.  Constitutional:      General: She is not in acute distress.    Appearance: She is normal weight. She is ill-appearing (Still multiple complaints, still chronically ill-appearing, but nontoxic.). She is not toxic-appearing.  HENT:     Head: Normocephalic and atraumatic.     Ears:     Comments: Extremely hard of hearing. Neck:     Vascular: Hepatojugular reflux and JVD (8 to 10 cm water) present. No carotid bruit.  Cardiovascular:     Rate and Rhythm: Normal rate and regular rhythm. No extrasystoles are present.    Chest Wall: PMI is not displaced.     Pulses: Decreased pulses (Diminished, palpable.  Warm feet).     Heart sounds: S1 normal and S2 normal. Heart sounds are distant. No midsystolic click. Murmur heard.  High-pitched blowing holosystolic murmur is present with a grade of 1/6 at the lower left sternal border and apex radiating to the axilla and apex.    No friction rub. Gallop present. S4 sounds present.     Comments: Normal S1 with split S2;; Cannot exclude S4 Pulmonary:     Effort: Pulmonary effort is normal.  Breath sounds: No rhonchi.     Comments:  Mild interstitial basal lung sounds, but otherwise CTA B.  Nonlabored. Chest:     Chest wall: No tenderness.  Abdominal:     General: Bowel sounds are normal. There is distension.     Palpations: There is no mass (No HSM, or bruits).     Tenderness: There is no abdominal tenderness.     Comments: Somewhat distended, protuberant.  Cannot appreciate ascites, but she says that her abdomen is more protuberant than usual.  Musculoskeletal:        General: Swelling (Trivial ankle swelling) present.     Cervical back: Normal range of motion and neck supple.  Skin:    General: Skin is warm and dry.     Coloration: Skin is not pale.  Neurological:     General: No focal deficit present.     Mental Status: She is alert and oriented to person, place, and time.     Motor: No weakness.     Gait: Gait abnormal.     Comments: Notable memory loss  Psychiatric:     Comments: She seems somewhat down, upset, depressed mood today. => Her son died of an MI on 04/28/2022. Somewhat tangential speech.  Partly because she is so hard of hearing.  Multiple complaints, just not feeling well.    Adult ECG Report Not checked  Recent Labs:  reviewded  Lab Results  Component Value Date   CREATININE 1.19 06/21/2021   BUN 43 (H) 06/21/2021   NA 135 06/21/2021   K 3.2 (L) 06/21/2021   CL 94 (L) 06/21/2021   CO2 30 06/21/2021   Lab Results  Component Value Date   CHOL 144 01/04/2021   HDL 43.60 01/04/2021   LDLCALC 86 01/04/2021   LDLDIRECT 163.3 01/30/2012   TRIG 75.0 01/04/2021   CHOLHDL 3 01/04/2021      Latest Ref Rng & Units 06/21/2021    3:51 PM 01/04/2021    3:28 PM 06/29/2020    3:43 PM  CBC  WBC 4.0 - 10.5 K/uL 5.5   7.4   5.8    Hemoglobin 12.0 - 15.0 g/dL 13.4   13.5   12.0    Hematocrit 36.0 - 46.0 % 39.6   41.1   35.4    Platelets 150.0 - 400.0 K/uL 156.0   144.0   161.0     Lab Results  Component Value Date   HGBA1C 6.4 01/04/2021   Lab Results  Component Value Date   TSH 3.45  06/21/2021    ==================================================  COVID-19 Education: The signs and symptoms of COVID-19 were discussed with the patient and how to seek care for testing (follow up with PCP or arrange E-visit).    I spent a total of 27  minutes with the patient spent in direct patient consultation.   Additional time spent with chart review  / charting (studies, outside notes, etc): 25 min Total Time: 52 min  Current medicines are reviewed at length with the patient today.  (+/- concerns) nA  Notice: This dictation was prepared with Dragon dictation along with smart phrase technology. Any transcriptional errors that result from this process are unintentional and may not be corrected upon review.  Studies Ordered:   No orders of the defined types were placed in this encounter.  Meds ordered this encounter  Medications  . bisoprolol (ZEBETA) 5 MG tablet    Sig: Take 0.5 tablets (2.5 mg total) by mouth daily.  Dispense:  90 tablet    Refill:  3    Changing directions  . losartan (COZAAR) 25 MG tablet    Sig: Take 1 tablet (25 mg total) by mouth every evening.    Dispense:  90 tablet    Refill:  3     Patient Instructions / Medication Changes & Studies & Tests Ordered   Patient Instructions  Medication Instructions:    Take Bisoprolol 2.5 mg in the morning only.  Take Losartan 25 mg in the evening only.  It is okay not take your diuretic one day of the weekend as long as you are not short of breath.    Dry weight  should be 145 lbs   *If you need a refill on your cardiac medications before your next appointment, please call your pharmacy*   Lab Work:  Not needed   Testing/Procedures: Not needed    Follow-Up: At [facility], you and your health needs are our priority.  As part of our continuing mission to provide you with exceptional heart care, we have created designated Provider Care Teams.  These Care Teams include your primary  Cardiologist (physician) and Advanced Practice Providers (APPs -  Physician Assistants and Nurse Practitioners) who all work together to provide you with the care you need, when you need it.     Your next appointment:   5 month(s)  The format for your next appointment:   In Person  Provider:   Glenetta Hew, MD         Glenetta Hew, M.D., M.S. Interventional Cardiologist   Pager # [phone] Phone # [phone] [address] [place], [place] [address]   Thank you for choosing Heartcare at [facility]!!

## 2021-08-02 NOTE — Assessment & Plan Note (Signed)
She is not describing any sensation of angina.  Septal wall motion abnormality makes it somewhat concerning, however.  In the absence of active anginal symptoms, I think we can hold off on ischemic evaluation for now.

## 2021-08-02 NOTE — Assessment & Plan Note (Signed)
Severe mitral and tricuspid regurgitation.  Unfortunately this would indicate the EF of 40 to 45% is actually worse than it truly is.  Certainly A-fib is making this worse as well.  Could consider referral to Dr. [name] to assess the possibility of MitraClip.  At present, I cannot tell if she would be interested in procedures or not.  Considering goals of care discussions.

## 2021-08-02 NOTE — Assessment & Plan Note (Addendum)
Longstanding A-fib.  On warfarin now per patient choice. No major bleeding issues.  INR followed by the Coumadin clinic.

## 2021-08-02 NOTE — Assessment & Plan Note (Addendum)
EF reduction is probably just progression of disease.  This is exacerbated by her mitral regurgitation.  Symptoms seem to have improved a little bit with the addition of afterload reduction, but she does not have enough blood pressure room to do anymore.  For now we will continue a.m. bisoprolol 2.5 mg only along with spironolactone and and her combination of furosemide/metolazone.  PM will be losartan 25 mg.  Target dry Weight 145 pounds.  I did give her permission to take the medications holiday 1 day out of the weekend.

## 2021-08-02 NOTE — Assessment & Plan Note (Signed)
Unfortunately, she has been ventricular paced with underlying left bundle block now her QRS complexes are much wider.  Certainly not helping her septal wall motion.  Likely given her advanced age, would not consider bridging to CRT-P

## 2021-08-02 NOTE — Assessment & Plan Note (Signed)
Unfortunately combination of left bundle branch block and RV pacing is not helping with septal wall motion.  This is probably part of the reason why her EF is down.  Also not helping her MR.

## 2021-08-02 NOTE — Assessment & Plan Note (Signed)
Now borderline hypotensive on low-dose bisoprolol and losartan.  We will have her switch to take bisoprolol the morning and losartan in the evening.

## 2021-08-03 ENCOUNTER — Other Ambulatory Visit: Payer: Self-pay | Admitting: Cardiology

## 2021-08-18 ENCOUNTER — Other Ambulatory Visit: Payer: Self-pay | Admitting: Cardiology

## 2021-08-18 NOTE — Telephone Encounter (Signed)
Prescription refill request received for warfarin Lov: Beverly Kim, 07/26/2021 Next INR check: 6/22 Warfarin tablet strength: 2.'5mg'$   Refill sent.

## 2021-08-29 DIAGNOSIS — H903 Sensorineural hearing loss, bilateral: Secondary | ICD-10-CM | POA: Diagnosis not present

## 2021-08-31 ENCOUNTER — Ambulatory Visit (INDEPENDENT_AMBULATORY_CARE_PROVIDER_SITE_OTHER): Payer: PPO | Admitting: *Deleted

## 2021-08-31 DIAGNOSIS — I4819 Other persistent atrial fibrillation: Secondary | ICD-10-CM

## 2021-08-31 DIAGNOSIS — I480 Paroxysmal atrial fibrillation: Secondary | ICD-10-CM

## 2021-08-31 DIAGNOSIS — Z7901 Long term (current) use of anticoagulants: Secondary | ICD-10-CM | POA: Diagnosis not present

## 2021-08-31 LAB — POCT INR: INR: 1.9 — AB (ref 2.0–3.0)

## 2021-08-31 NOTE — Patient Instructions (Signed)
Description   Today take 2.5 tablets then continue taking 2 tablets daily. Recheck INR in 4 weeks. Coumadin Clinic [phone]

## 2021-09-05 ENCOUNTER — Other Ambulatory Visit: Payer: Self-pay | Admitting: Cardiology

## 2021-09-18 ENCOUNTER — Ambulatory Visit (INDEPENDENT_AMBULATORY_CARE_PROVIDER_SITE_OTHER): Payer: PPO

## 2021-09-18 DIAGNOSIS — I442 Atrioventricular block, complete: Secondary | ICD-10-CM

## 2021-09-20 LAB — CUP PACEART REMOTE DEVICE CHECK
Battery Remaining Longevity: 49 mo
Battery Remaining Percentage: 41 %
Battery Voltage: 2.96 V
Brady Statistic AP VP Percent: 71 %
Brady Statistic AP VS Percent: 0 %
Brady Statistic AS VP Percent: 29 %
Brady Statistic AS VS Percent: 0 %
Brady Statistic RA Percent Paced: 1 %
Brady Statistic RV Percent Paced: 93 %
Date Time Interrogation Session: 20230715184040
Implantable Lead Implant Date: 20180313
Implantable Lead Implant Date: 20180313
Implantable Lead Location: 753859
Implantable Lead Location: 753860
Implantable Pulse Generator Implant Date: 20180313
Lead Channel Impedance Value: 410 Ohm
Lead Channel Impedance Value: 410 Ohm
Lead Channel Pacing Threshold Amplitude: 1 V
Lead Channel Pacing Threshold Amplitude: 1.25 V
Lead Channel Pacing Threshold Pulse Width: 0.5 ms
Lead Channel Pacing Threshold Pulse Width: 0.5 ms
Lead Channel Sensing Intrinsic Amplitude: 1 mV
Lead Channel Sensing Intrinsic Amplitude: 7.9 mV
Lead Channel Setting Pacing Amplitude: 1.25 V
Lead Channel Setting Pacing Amplitude: 2.5 V
Lead Channel Setting Pacing Pulse Width: 0.5 ms
Lead Channel Setting Sensing Sensitivity: 4 mV
Pulse Gen Model: 2272
Pulse Gen Serial Number: 8005625

## 2021-09-22 DIAGNOSIS — I5040 Unspecified combined systolic (congestive) and diastolic (congestive) heart failure: Secondary | ICD-10-CM | POA: Diagnosis not present

## 2021-09-22 DIAGNOSIS — Z6827 Body mass index (BMI) 27.0-27.9, adult: Secondary | ICD-10-CM | POA: Diagnosis not present

## 2021-09-28 ENCOUNTER — Ambulatory Visit (INDEPENDENT_AMBULATORY_CARE_PROVIDER_SITE_OTHER): Payer: PPO

## 2021-09-28 DIAGNOSIS — I4819 Other persistent atrial fibrillation: Secondary | ICD-10-CM

## 2021-09-28 DIAGNOSIS — Z7901 Long term (current) use of anticoagulants: Secondary | ICD-10-CM

## 2021-09-28 DIAGNOSIS — I480 Paroxysmal atrial fibrillation: Secondary | ICD-10-CM

## 2021-09-28 LAB — POCT INR: INR: 1.9 — AB (ref 2.0–3.0)

## 2021-09-28 NOTE — Patient Instructions (Signed)
Description   START taking 2 tablets daily EXCEPT 2.5 tablets every Thursday.  Recheck INR in 3 weeks. Coumadin Clinic [phone]

## 2021-10-16 NOTE — Progress Notes (Signed)
Remote pacemaker transmission.   

## 2021-10-19 ENCOUNTER — Telehealth: Payer: Self-pay

## 2021-10-19 NOTE — Telephone Encounter (Signed)
Lp's daughter message to reschedule INR check.

## 2021-10-25 ENCOUNTER — Encounter: Payer: Self-pay | Admitting: Cardiology

## 2021-10-25 ENCOUNTER — Other Ambulatory Visit: Payer: Self-pay | Admitting: Cardiology

## 2021-10-25 DIAGNOSIS — Z7901 Long term (current) use of anticoagulants: Secondary | ICD-10-CM

## 2021-10-25 DIAGNOSIS — I10 Essential (primary) hypertension: Secondary | ICD-10-CM

## 2021-10-25 DIAGNOSIS — Z5181 Encounter for therapeutic drug level monitoring: Secondary | ICD-10-CM

## 2021-10-25 DIAGNOSIS — I4819 Other persistent atrial fibrillation: Secondary | ICD-10-CM

## 2021-10-25 MED ORDER — POTASSIUM CHLORIDE ER 10 MEQ PO TBCR: EXTENDED_RELEASE_TABLET | ORAL | 11 refills | Status: DC

## 2021-10-25 NOTE — Telephone Encounter (Signed)
Spoke to [place]. Patient  prescription was changed in 06/22/21 . For short period of time  5 days /patient was suppose to go and have lab work  thereafter.   Daughter states she is not sure how the patient is taking medication but she ha 1 pill left.    Prior to 06/22/21- patient was to take potassium each day she takes her metolazone  ( which should have been Tuesday , Thursday, Saturdays)   Mickel Baas is aware to have BMP and INR,  Prescription will be sent into the pharmacy-( 10 MEQ potassium on the days metolazone is taken)  Aware will call with further instruction once labwork is resulted

## 2021-10-26 ENCOUNTER — Telehealth: Payer: Self-pay

## 2021-10-26 NOTE — Telephone Encounter (Signed)
Called and spoke with pt's daughter who stated pt was coming to [facility] office to have labs drawn. I made her aware that a lab order was placed to have PT/INR checked as well; however, pt's daughter stated they would rather have an appt with Coumadin Clinic and have other labs drawn in lab. Scheduled pt an appt for Coumadin Clinic on 11/01/21 and placed note on appt to send pt to lab after [facility]in Clinic appt. Daughter was very appreciative of the help and will bring pt to appt next week.

## 2021-11-01 ENCOUNTER — Ambulatory Visit: Payer: PPO | Attending: Cardiology | Admitting: *Deleted

## 2021-11-01 ENCOUNTER — Other Ambulatory Visit: Payer: Self-pay

## 2021-11-01 DIAGNOSIS — I4819 Other persistent atrial fibrillation: Secondary | ICD-10-CM | POA: Diagnosis not present

## 2021-11-01 DIAGNOSIS — R0609 Other forms of dyspnea: Secondary | ICD-10-CM | POA: Diagnosis not present

## 2021-11-01 DIAGNOSIS — Z7901 Long term (current) use of anticoagulants: Secondary | ICD-10-CM | POA: Diagnosis not present

## 2021-11-01 DIAGNOSIS — I10 Essential (primary) hypertension: Secondary | ICD-10-CM | POA: Diagnosis not present

## 2021-11-01 DIAGNOSIS — I480 Paroxysmal atrial fibrillation: Secondary | ICD-10-CM

## 2021-11-01 DIAGNOSIS — Z5181 Encounter for therapeutic drug level monitoring: Secondary | ICD-10-CM

## 2021-11-01 DIAGNOSIS — I4891 Unspecified atrial fibrillation: Secondary | ICD-10-CM | POA: Diagnosis not present

## 2021-11-01 DIAGNOSIS — I5032 Chronic diastolic (congestive) heart failure: Secondary | ICD-10-CM | POA: Diagnosis not present

## 2021-11-01 DIAGNOSIS — Z950 Presence of cardiac pacemaker: Secondary | ICD-10-CM | POA: Diagnosis not present

## 2021-11-01 DIAGNOSIS — D6869 Other thrombophilia: Secondary | ICD-10-CM | POA: Diagnosis not present

## 2021-11-01 LAB — POCT INR: INR: 2.4 (ref 2.0–3.0)

## 2021-11-01 NOTE — Patient Instructions (Signed)
Description   Continue taking 2 tablets daily EXCEPT 2.5 tablets every Thursday. Recheck INR in 5 weeks. Coumadin Clinic [phone]

## 2021-11-02 LAB — COMPREHENSIVE METABOLIC PANEL
ALT: 15 IU/L (ref 0–32)
AST: 27 IU/L (ref 0–40)
Albumin/Globulin Ratio: 1.8 (ref 1.2–2.2)
Albumin: 4.5 g/dL (ref 3.6–4.6)
Alkaline Phosphatase: 108 IU/L (ref 44–121)
BUN/Creatinine Ratio: 23 (ref 12–28)
BUN: 34 mg/dL (ref 10–36)
Bilirubin Total: 1.2 mg/dL (ref 0.0–1.2)
CO2: 23 mmol/L (ref 20–29)
Calcium: 9.9 mg/dL (ref 8.7–10.3)
Chloride: 98 mmol/L (ref 96–106)
Creatinine, Ser: 1.45 mg/dL — ABNORMAL HIGH (ref 0.57–1.00)
Globulin, Total: 2.5 g/dL (ref 1.5–4.5)
Glucose: 121 mg/dL — ABNORMAL HIGH (ref 70–99)
Potassium: 4.6 mmol/L (ref 3.5–5.2)
Sodium: 139 mmol/L (ref 134–144)
Total Protein: 7 g/dL (ref 6.0–8.5)
eGFR: 33 mL/min/{1.73_m2} — ABNORMAL LOW (ref >59)

## 2021-11-02 LAB — BRAIN NATRIURETIC PEPTIDE: BNP: 267.4 pg/mL — ABNORMAL HIGH (ref 0.0–100.0)

## 2021-11-08 IMAGING — DX DG CHEST 1V PORT
1 series · 1 of 1 positions shown · non-contrast
Comparison: Chest x-ray 07/23/2019.

CLINICAL DATA: Shortness of breath.

EXAM:
PORTABLE CHEST 1 VIEW

[chest ap]
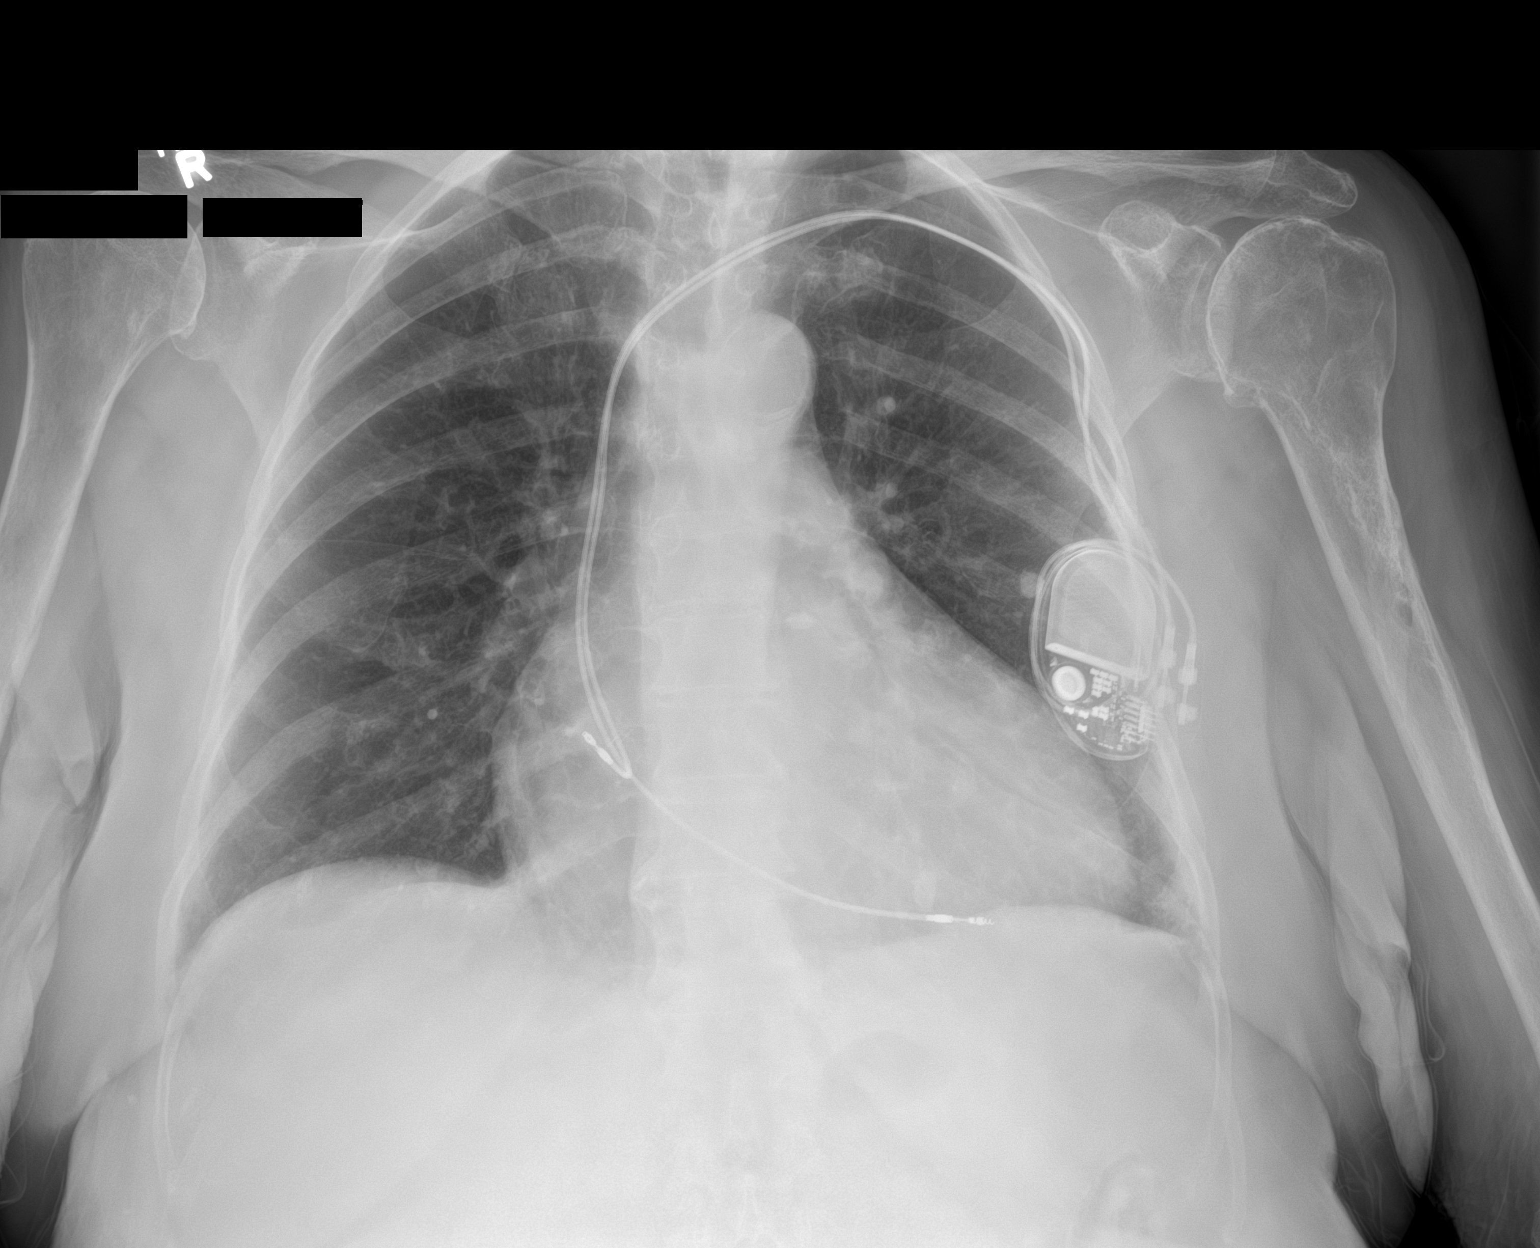

[1 of 1 positions shown; findings below may reference images not displayed]

FINDINGS: Cardiac pacer with lead tips over the right atrium right ventricle.
Cardiomegaly. No pulmonary venous congestion. Calcified left hilar
lymph nodes and bilateral pulmonary nodules again noted consistent
with prior granulomas disease. Left base pleural-parenchymal
thickening again noted consistent scarring. Stable punctate
well-circumscribed lucency noted the proximal left humerus. Old
posttraumatic changes noted the proximal left humerus, unchanged. No
acute bony abnormality identified.
IMPRESSION: 1.  Cardiac pacer stable position.  Stable cardiomegaly.

2. Evidence of prior granulomas disease. No acute pulmonary disease
identified.

## 2021-11-10 ENCOUNTER — Telehealth: Payer: Self-pay | Admitting: *Deleted

## 2021-11-10 DIAGNOSIS — I5042 Chronic combined systolic (congestive) and diastolic (congestive) heart failure: Secondary | ICD-10-CM

## 2021-11-10 DIAGNOSIS — Z5181 Encounter for therapeutic drug level monitoring: Secondary | ICD-10-CM

## 2021-11-10 NOTE — Telephone Encounter (Signed)
-----   Message from Leonie Man, MD sent at 11/08/2021  6:33 PM EDT ----- Overall, the labs look like the kidney function has gone down just a little bit which may be related to potentially low blood pressures or being dehydrated.  Would like to hold off on the Friday dose of metolazone, and hold losartan for 2 days. The BNP level (measure of heart failure) is definitely lower than 5 months ago but still little bit elevated which is probably normal for age.  I would like to recheck a simple BMP chem panel by mid ~9/13-9/15 time frame  [facility]

## 2021-11-10 NOTE — Telephone Encounter (Signed)
Left message -  for daughter Beverly Kim  Per  instruction - hold the next dose of Metolazone and not to take Losartan for next 2 days   Recheck  BMP   between 9/13 to 9/18.   Requested daughter to call back to confirm she receive message

## 2021-11-13 NOTE — Telephone Encounter (Signed)
I think we just need to have BMP & BNP checked in the next day or so -- need to understand kidney Fxn.     Need to see if she did indeed hold metolazone & losartan or not.   If she did -- then go ahead & take metolazone with base diuretic (if not taken today - take on Tuesday) --  It may just be that in order to keep her wgt stable, we need for her Creatinine to be 1.5.  Probably easier to manage with visit -- to fully understand. [place]

## 2021-11-13 NOTE — Telephone Encounter (Signed)
Pt son calling back for clarification on medication instructions

## 2021-11-13 NOTE — Telephone Encounter (Signed)
Called pt's sone. He was made aware a message was left for his sister on Friday. Went over the recommendations from Dr. Ellyn Hack. He is states her weight is 158 today and it was 151 a few days ago. "I'm not sure exactly how long it's taken her to gain the weight but I remember her saying it's been a steady gain." He states "she always has SOB but she gets worst when she has fluid build up." He wants to know should he hold off on Metolazone not just one dose. Will get message to Dr. Ellyn Hack and Ivin Booty for review.

## 2021-11-14 ENCOUNTER — Ambulatory Visit: Payer: PPO | Admitting: Podiatry

## 2021-11-14 ENCOUNTER — Other Ambulatory Visit: Payer: Self-pay

## 2021-11-14 DIAGNOSIS — Z5181 Encounter for therapeutic drug level monitoring: Secondary | ICD-10-CM

## 2021-11-14 DIAGNOSIS — Z7901 Long term (current) use of anticoagulants: Secondary | ICD-10-CM | POA: Diagnosis not present

## 2021-11-14 NOTE — Telephone Encounter (Signed)
Called pt's son. He states his sister did receive the message Friday from [place]. He is unsure if she held Metolazone and Losartan. He is made aware Dr. Ellyn Hack wants his mother to have labs drawn within the next day or so. He states they will come either today or tomorrow. Orders have been adjusted to reflect Dr. Allison Quarry most recent request.

## 2021-11-15 ENCOUNTER — Ambulatory Visit (INDEPENDENT_AMBULATORY_CARE_PROVIDER_SITE_OTHER): Payer: PPO

## 2021-11-15 DIAGNOSIS — Z7901 Long term (current) use of anticoagulants: Secondary | ICD-10-CM | POA: Diagnosis not present

## 2021-11-15 DIAGNOSIS — I480 Paroxysmal atrial fibrillation: Secondary | ICD-10-CM | POA: Diagnosis not present

## 2021-11-15 LAB — BASIC METABOLIC PANEL
BUN/Creatinine Ratio: 26 (ref 12–28)
BUN: 32 mg/dL (ref 10–36)
CO2: 26 mmol/L (ref 20–29)
Calcium: 9.8 mg/dL (ref 8.7–10.3)
Chloride: 98 mmol/L (ref 96–106)
Creatinine, Ser: 1.21 mg/dL — ABNORMAL HIGH (ref 0.57–1.00)
Glucose: 111 mg/dL — ABNORMAL HIGH (ref 70–99)
Potassium: 5 mmol/L (ref 3.5–5.2)
Sodium: 139 mmol/L (ref 134–144)
eGFR: 41 mL/min/{1.73_m2} — ABNORMAL LOW (ref >59)

## 2021-11-15 LAB — PROTIME-INR
INR: 1.9 — ABNORMAL HIGH (ref 0.9–1.2)
Prothrombin Time: 19.5 s — ABNORMAL HIGH (ref 9.1–12.0)

## 2021-11-15 LAB — BRAIN NATRIURETIC PEPTIDE: BNP: 297.5 pg/mL — ABNORMAL HIGH (ref 0.0–100.0)

## 2021-11-15 NOTE — Patient Instructions (Signed)
Description   Called and spoke with pt's daughter. Instructed for pt to 2.5 tablets today and then continue taking 2 tablets daily EXCEPT 2.5 tablets every Thursday. Recheck INR in 3 weeks. [facility] [phone]

## 2021-11-27 ENCOUNTER — Telehealth: Payer: Self-pay | Admitting: Internal Medicine

## 2021-11-27 NOTE — Telephone Encounter (Signed)
LVM for pt to rtn my call to schedule AWV with [facility] call back # [phone] 

## 2021-12-06 ENCOUNTER — Ambulatory Visit: Payer: PPO | Attending: Cardiology | Admitting: *Deleted

## 2021-12-06 DIAGNOSIS — I480 Paroxysmal atrial fibrillation: Secondary | ICD-10-CM

## 2021-12-06 DIAGNOSIS — Z7901 Long term (current) use of anticoagulants: Secondary | ICD-10-CM

## 2021-12-06 DIAGNOSIS — I4819 Other persistent atrial fibrillation: Secondary | ICD-10-CM

## 2021-12-06 LAB — POCT INR: INR: 1.9 — AB (ref 2.0–3.0)

## 2021-12-06 NOTE — Patient Instructions (Addendum)
Description   Today take 2.5 tablets then start taking 2 tablets daily EXCEPT 2.5 tablets every Sunday and Thursday. Recheck INR in 4 weeks with MD appt. [facility] [phone]

## 2021-12-10 ENCOUNTER — Other Ambulatory Visit: Payer: Self-pay | Admitting: Cardiology

## 2021-12-18 ENCOUNTER — Ambulatory Visit (INDEPENDENT_AMBULATORY_CARE_PROVIDER_SITE_OTHER): Payer: PPO

## 2021-12-18 DIAGNOSIS — I480 Paroxysmal atrial fibrillation: Secondary | ICD-10-CM | POA: Diagnosis not present

## 2021-12-25 ENCOUNTER — Telehealth: Payer: Self-pay | Admitting: Internal Medicine

## 2021-12-25 LAB — CUP PACEART REMOTE DEVICE CHECK
Battery Remaining Longevity: 46 mo
Battery Remaining Percentage: 38 %
Battery Voltage: 2.96 V
Brady Statistic AP VP Percent: 62 %
Brady Statistic AP VS Percent: 0 %
Brady Statistic AS VP Percent: 38 %
Brady Statistic AS VS Percent: 0 %
Brady Statistic RA Percent Paced: 1 %
Brady Statistic RV Percent Paced: 96 %
Date Time Interrogation Session: 20231021212606
Implantable Lead Implant Date: 20180313
Implantable Lead Implant Date: 20180313
Implantable Lead Location: 753859
Implantable Lead Location: 753860
Implantable Pulse Generator Implant Date: 20180313
Lead Channel Impedance Value: 390 Ohm
Lead Channel Impedance Value: 400 Ohm
Lead Channel Pacing Threshold Amplitude: 0.875 V
Lead Channel Pacing Threshold Amplitude: 1.25 V
Lead Channel Pacing Threshold Pulse Width: 0.5 ms
Lead Channel Pacing Threshold Pulse Width: 0.5 ms
Lead Channel Sensing Intrinsic Amplitude: 1 mV
Lead Channel Sensing Intrinsic Amplitude: 5.9 mV
Lead Channel Setting Pacing Amplitude: 1.125
Lead Channel Setting Pacing Amplitude: 2.5 V
Lead Channel Setting Pacing Pulse Width: 0.5 ms
Lead Channel Setting Sensing Sensitivity: 4 mV
Pulse Gen Model: 2272
Pulse Gen Serial Number: 8005625

## 2021-12-25 NOTE — Telephone Encounter (Signed)
LVM for pt to rtn my call to schedule AWV with [facility] call back # [phone] 

## 2022-01-02 ENCOUNTER — Ambulatory Visit: Payer: PPO | Attending: Cardiology | Admitting: Cardiology

## 2022-01-02 ENCOUNTER — Encounter: Payer: Self-pay | Admitting: Cardiology

## 2022-01-02 ENCOUNTER — Ambulatory Visit (INDEPENDENT_AMBULATORY_CARE_PROVIDER_SITE_OTHER): Payer: PPO | Admitting: *Deleted

## 2022-01-02 VITALS — BP 138/60 | HR 60 | Ht 62.0 in | Wt 151.4 lb

## 2022-01-02 DIAGNOSIS — I10 Essential (primary) hypertension: Secondary | ICD-10-CM

## 2022-01-02 DIAGNOSIS — Z950 Presence of cardiac pacemaker: Secondary | ICD-10-CM

## 2022-01-02 DIAGNOSIS — Z7901 Long term (current) use of anticoagulants: Secondary | ICD-10-CM

## 2022-01-02 DIAGNOSIS — I340 Nonrheumatic mitral (valve) insufficiency: Secondary | ICD-10-CM | POA: Diagnosis not present

## 2022-01-02 DIAGNOSIS — I442 Atrioventricular block, complete: Secondary | ICD-10-CM | POA: Diagnosis not present

## 2022-01-02 DIAGNOSIS — I2510 Atherosclerotic heart disease of native coronary artery without angina pectoris: Secondary | ICD-10-CM

## 2022-01-02 DIAGNOSIS — I4819 Other persistent atrial fibrillation: Secondary | ICD-10-CM

## 2022-01-02 DIAGNOSIS — D6869 Other thrombophilia: Secondary | ICD-10-CM | POA: Diagnosis not present

## 2022-01-02 DIAGNOSIS — I5042 Chronic combined systolic (congestive) and diastolic (congestive) heart failure: Secondary | ICD-10-CM | POA: Diagnosis not present

## 2022-01-02 DIAGNOSIS — I480 Paroxysmal atrial fibrillation: Secondary | ICD-10-CM | POA: Diagnosis not present

## 2022-01-02 DIAGNOSIS — I447 Left bundle-branch block, unspecified: Secondary | ICD-10-CM

## 2022-01-02 LAB — POCT INR: INR: 2.2 (ref 2.0–3.0)

## 2022-01-02 MED ORDER — LOSARTAN POTASSIUM 25 MG PO TABS: 50.0000 mg | ORAL_TABLET | Freq: Every evening | ORAL | 3 refills | Status: DC

## 2022-01-02 MED ORDER — METOLAZONE 2.5 MG PO TABS: ORAL_TABLET | ORAL | 8 refills | Status: DC

## 2022-01-02 NOTE — Progress Notes (Signed)
Primary Care Provider: [name], MD [place] Cardiologist: Glenetta Hew, MD Electrophysiologist: None  Clinic Note: Chief Complaint  Patient presents with   Follow-up    6 months.  Doing okay, just has had more swelling of late.   Congestive Heart Failure    Starting to put on weight/fluid.  More dyspneic with orthopnea.   ===================================  ASSESSMENT/PLAN   Problem List Items Addressed This Visit       Cardiology Problems   Chronic combined systolic and diastolic CHF (congestive heart failure) (HCC) - Primary (Chronic)    Again, EF reduction likely related to bundle branch block and paced rhythm with underlying A-fib.  Nonischemic Myoview. She is having class III symptoms now with orthopnea and edema.  Plan: Target weight is again back to the 145 pound range which 6 pounds down from now. Increase afterload reduction with losartan up to 50 mg daily.   Continue bisoprolol 2.5 mg, and spironolactone 25 mg daily. Continue taking furosemide daily but go back to taking the metolazone as a standing dose on Monday Wednesday Friday.       Relevant Medications   losartan (COZAAR) 25 MG tablet   metolazone (ZAROXOLYN) 2.5 MG tablet   Paroxysmal atrial fibrillation (HCC) (Chronic)    My evaluation back in April, roughly 45% A-fib burden.  She does not notice a difference.  She is on low-dose Zebeta for A-fib rate control, but it basically has backup pacemaking.  Plan is to simply allow her to go into A-fib using beta-blocker for rate control with PPM to avoid bradycardia      Relevant Medications   losartan (COZAAR) 25 MG tablet   metolazone (ZAROXOLYN) 2.5 MG tablet   Complete heart block (HCC) (Chronic)   Relevant Medications   losartan (COZAAR) 25 MG tablet   metolazone (ZAROXOLYN) 2.5 MG tablet   Coronary artery disease involving native heart without angina pectoris (Chronic)    Moderate LAD disease back in 2003 with a Myoview  negative in 2013.  No active anginal symptoms.  No plans for further ischemic evaluation at this time.  She is on a beta-blocker.  Not on aspirin because of warfarin.  Not on statin per patient preference.      Relevant Medications   losartan (COZAAR) 25 MG tablet   metolazone (ZAROXOLYN) 2.5 MG tablet   Essential hypertension (Chronic)    Blood pressure is now stable on current dose of losartan and bisoprolol.  We will increase losartan to 50 mg daily for more active reduction.      Relevant Medications   losartan (COZAAR) 25 MG tablet   metolazone (ZAROXOLYN) 2.5 MG tablet   LBBB (left bundle branch block) (Chronic)   Relevant Medications   losartan (COZAAR) 25 MG tablet   metolazone (ZAROXOLYN) 2.5 MG tablet   Non-rheumatic mitral regurgitation (Chronic)    Severe due to valvular regurgitation with mitral and tricuspid valve.  With reduced EF, may actually be more significant.  Exacerbated by A-fib.  I wonder if she went into A-fib which triggered this most recent exacerbation.  Discussed with Dr. [name], but not likely getting Bhagat beneficial to perform a clip.  Essentially now are limited to palliation.  Symptomatic control.  In follow-up visit as needed to discuss EOL intentions.      Relevant Medications   losartan (COZAAR) 25 MG tablet   metolazone (ZAROXOLYN) 2.5 MG tablet     Other   Cardiac pacemaker (Chronic)    Combination of  pacing with underlying left bundle branch block makes the QRS complex is wide which hinders septal motion exacerbates tricuspid and mitral vegetation as well as EF.  Not likely a candidate for CRT P      Secondary hypercoagulable state ([place]) (Chronic)    Remains on warfarin again per patient choice.  If necessary, would be okay to hold warfarin without bridging.       ===================================  HPI:    Beverly Kim is a 88 y.o. female with a PMH below who presents today for 6 month /fu.  CARDIO VASCULAR  HISTORY: --> Previously followed by Dr Verl Blalock, then Dr. Percival Spanish -> transferred because of other family member being followed as well.  (Her daughter, [name] is a pt of mine)   CAD: Cardiac Cath 2003: 50-70% LAD, nonischemic Myoview. HFpEF -with Mod MR/TTR December 2013: Echo EF 50 and 55%.  Normal function.-Moderate MR.  Mild LA dilation  November 2015 --> Echo for shortness of breath => reduced EF of 45 to 50%.  Mild LVH.  Inferior HK.  GRII DD.  Moderate MR. Echo 10/2019: EF 60 -65%.  No RWMA.  Severe concentric LVH.  Unable to assess diastolic function.  Mild biatrial dilation.  Mod MR / TR, no AS Echo 06/2021: EF 40-45%. Paradoxical septal WM. Severe MR & TR. Severe Bi Atrial Enlargement. Mild LVH.  CHB/LBBB March 2018--presented with fatigue and dyspnea, 3 AVB --> dual-chamber PPM  F/u Echo 06/2016 (see below) - EF 60-65%.  Afib (Persistent Afib noted on PPM Interrogation) - 08/2016 CHA2DS2-VASc score 5: Age-15, female, HTN, aortic plaque-3. DCCV 01/04/2019 =>  Has since failed rhythm control at least 49 to 50% A. fib burden => she has declined FURTHER DCCV or AAT January 2023: Converted from Xarelto to Coumadin-=> complaints of worsening arthritis pain but the pain etc.  She asked to switch to Coumadin.  \  Beverly Kim was last seen on Jul 25, 2021.  The indication was that her EF reduction was probably simply related to progressive disease and exacerbated by her A-fib and mitral regurgitation.  She had improved some with the addition of afterload reduction using losartan.  Taking 2.5 mg bisoprolol along with losartan 25 mg daily.  Set target dry weight of 145 pounds  She was seen by Dr. Rayann Heman on 07/26/2021: Noted feeling tired all the time.  Lots of noncardiac complaints including nausea and fatigue.  Not very active.  EP evaluation indicated: Persistent atrial fibrillation.  Well controlled (burden 45%)Normal pacemaker function; Lower rate during afib adjusted to 60 bpm; Not device  dependant today but she was very symptomatic with RV sensing test today.  Recent Hospitalizations: None  Reviewed  CV studies:    The following studies were reviewed today: (if available, images/films reviewed: From Epic Chart or Care Everywhere) Echo 06/2021: EF estimated 40 to 45%-reduced from 60 to 65% 10/07/2019: PAP estimated 34 mmHg..  Interval History:   Beverly Kim presents today still noting exertional dyspnea with simply try to get around.  She tends to sleep quite a lot.  Thankfully though she does not feel as wobbly as she had.  No real sensation of irregular heartbeats or palpitations.  No PND but does have orthopnea.  Stable edema.  She has not been taking the Lasix 3 days a week she is actually taking it daily now because of her weight going up.  She is noticing more prominent orthopnea STEMI is hard to lie down she feels like something  in her throat as if she cannot breathe. Still has exertional dyspnea but no chest pain or pressure.  No syncope or near syncope.  CV Review of Symptoms (Summary): positive for - dyspnea on exertion, edema, orthopnea, and paroxysmal nocturnal dyspnea negative for - chest pain, irregular heartbeat, loss of consciousness, palpitations, rapid heart rate, or near syncope, TIA/amaurosis fugax or claudication  REVIEWED OF SYSTEMS   Review of Systems  Constitutional:  Positive for malaise/fatigue.  HENT:  Negative for congestion.   Respiratory:  Positive for shortness of breath.   Cardiovascular:  Positive for orthopnea.  Gastrointestinal:  Negative for blood in stool and melena.  Genitourinary:  Positive for frequency. Negative for dysuria.  Musculoskeletal:  Positive for back pain and joint pain.       Patient is a wheelchair-bound.  Just gets around the house with a walker.  Neurological:  Positive for dizziness and weakness.  Psychiatric/Behavioral:  Positive for memory loss. The patient has insomnia.   All other systems reviewed and  are negative.  I have reviewed and (if needed) personally updated the patient's problem list, medications, allergies, past medical and surgical history, social and family history.   PAST MEDICAL HISTORY   Past Medical History:  Diagnosis Date   Cardiac pacemaker 05/2016   Sick sinus syndrome/complete heart block   Chronic heart failure with preserved ejection fraction (HFpEF) (HCC)    Colon polyps    Diverticulosis    Dyspnea    GERD (gastroesophageal reflux disease)    Hearing loss of both ears    Hepatic cyst    Hiatal hernia    Hyperlipidemia    Hypertension    Hypothyroidism    Low back pain    Non-occlusive coronary artery disease 04/2001   Cardiac Cath 04/18/2001: 50 and 70% mid LAD after D1. = Nonischemic by Myoview.  Medical management.   Other left bundle branch block    PAF (paroxysmal atrial fibrillation) ([place]) 08/16/2016   observed on PPM interrogation, asymptomatic, chad2vasc score is 5.  Bisoprolol for rate control, Xarelto for anticoagulation.   URI (upper respiratory infection)     PAST SURGICAL HISTORY   Past Surgical History:  Procedure Laterality Date   CARDIOVERSION N/A 01/16/2019   Procedure: CARDIOVERSION;  Surgeon: Josue Hector, MD;  Location: [facility] ENDOSCOPY;  Service: Cardiovascular;  Laterality: N/A;   CHOLECYSTECTOMY N/A 12/15/2019   Procedure: LAPAROSCOPIC CHOLECYSTECTOMY;  Surgeon: Stark Klein, MD;  Location: [facility];  Service: General;  Laterality: N/A;   COLONOSCOPY     ERCP N/A 07/21/2019   Procedure: ENDOSCOPIC RETROGRADE CHOLANGIOPANCREATOGRAPHY (ERCP);  Surgeon: Milus Banister, MD;  Location: [facility] ENDOSCOPY;  Service: Endoscopy;  Laterality: N/A;   HERNIA REPAIR     LEFT HEART CATH AND CORONARY ANGIOGRAPHY  04/18/2001   Dr. Lia Foyer: EF 60 to 65%.  2+ MR.  Upper LM takeoff with 90 degree bend just inside the ostium.  50-70% mid LAD after D1.  D1 is 30%.    Distal LAD wraps the apex and is free of disease.  LCx is mostly large OM branch.  RCA  provides PDA and PL branches and Free of disease. -->  Presumably evaluated by Myoview that was nonischemic, therefore no PCI performed.   NM MYOVIEW LTD  01/2012   Normal EF.  Normal study.  No ischemia or infarction.   PACEMAKER IMPLANT Left 05/15/2016   SJM Assirity MRI dual chamber PPM implanted by Dr Rayann Heman for CHB   REMOVAL OF STONES  07/21/2019  Procedure: REMOVAL OF STONES;  Surgeon: Milus Banister, MD;  Location: [facility] ENDOSCOPY;  Service: Endoscopy;;   SPHINCTEROTOMY  07/21/2019   Procedure: [name];  Surgeon: Milus Banister, MD;  Location: [facility] ENDOSCOPY;  Service: Endoscopy;;   THYROIDECTOMY     TONSILLECTOMY AND ADENOIDECTOMY     TOTAL ABDOMINAL HYSTERECTOMY W/ BILATERAL SALPINGOOPHORECTOMY     TRANSTHORACIC ECHOCARDIOGRAM  06/27/2016   Moderate basal-septal LVH, EF 60 to 65%.  No R WMA.  GR 1 DD.  Mild aortic valve calcification.  No AS.  Trivial MR.--Indicated improved EF compared to prior study in 2015.   TRANSTHORACIC ECHOCARDIOGRAM  02/22/2012   12/'13 -a) EF 50 and 55%.  Normal function.-Moderate MR.  Mild LA dilation.; b) 11/2015Echo for shortness of breath => reduced EF of 45 to 50%.  Mild LVH.  Inferior reversible HK.  GRII DD.  Moderate MR.   TRANSTHORACIC ECHOCARDIOGRAM  10/2019   EF 60 -65%.  No RWMA.  Severe concentric LVH.  Unable to assess diastolic function.  Mild biatrial dilation.  Moderate MR moderate TR, no AS   TRANSTHORACIC ECHOCARDIOGRAM  06/15/2021   EF 40 to 45%.  Paradoxical septal motion from pacemaker.  Mild concentric LVH.  Indeterminate diastolic pressure.  Severe biatrial enlargement would argue significant diastolic dysfunction.  Severe MR and severe TR. => EF down from 60 to 65%.    Immunization History  Administered Date(s) Administered   Influenza Split 12/25/2010, 12/25/2011   Influenza Whole 03/05/2005, 12/13/2008   Influenza, High Dose Seasonal PF 12/16/2012, 01/15/2017   PFIZER(Purple Top)SARS-COV-2 Vaccination 05/03/2019,  06/03/2019   Pneumococcal Polysaccharide-23 03/05/2002, 08/12/2007   Td 03/06/2003   Zoster, Live 08/12/2007    MEDICATIONS/ALLERGIES   Current Meds  Medication Sig   acetaminophen (TYLENOL) 650 MG CR tablet Take 650 mg by mouth every 8 (eight) hours as needed for pain.   bisoprolol (ZEBETA) 5 MG tablet Take 0.5 tablets (2.5 mg total) by mouth daily.   furosemide (LASIX) 40 MG tablet Take 80 mg  ( 2 tablet) on Monday, Wednesday, Friday -> patient is taking daily Lasix for the last 2 weeks   losartan (COZAAR) 25 MG tablet Take 1 tablet (25 mg total) by mouth every evening.   metolazone (ZAROXOLYN) 2.5 MG tablet TAKE 1 TABLET 3 TIMES WEEKLY - has only been using PRN   ondansetron (ZOFRAN-ODT) 4 MG disintegrating tablet Take 4 mg by mouth every 8 (eight) hours as needed.   pantoprazole (PROTONIX) 40 MG tablet Take 1 tablet (40 mg total) by mouth -3 days a week   potassium chloride (KLOR-CON) 10 MEQ tablet TAKE 1 TABLET ( 10 MEQ total) BY MOUTH ONCE DAILY ON  DAYS  METOLAZONE  IS  TAKEN   spironolactone (ALDACTONE) 25 MG tablet Take 0.5 tablets (12.5 mg total) by mouth daily.   SYNTHROID 100 MCG tablet TAKE 1 TABLET BY MOUTH EVERY DAY BEFORE BREAKFAST   warfarin (COUMADIN) 2.5 MG tablet TAKE 2 TABLETS DAILY OR AS PRESCRIBED BY COUMADIN CLINIC    Allergies  Allergen Reactions   Adhesive [Tape] Itching, Dermatitis, Rash and Other (See Comments)    Blisters and "skin bubbles"   Ace Inhibitors Cough   Atorvastatin Other (See Comments)    REACTION: Reaction not known   Clindamycin Other (See Comments)    Unknown   Codeine Other (See Comments)    hallucinations    Latex Other (See Comments)    blisters   Shellfish Allergy Other (See Comments)    "gallbladder attack"  Simvastatin Other (See Comments)    fatigue   Penicillins Rash    Has patient had a PCN reaction causing immediate rash, facial/tongue/throat swelling, SOB or lightheadedness with hypotension: Unknown Has patient had a  PCN reaction causing severe rash involving mucus membranes or skin necrosis: Unknown Has patient had a PCN reaction that required hospitalization: Unknown Has patient had a PCN reaction occurring within the last 10 years: No If all of the above answers are "NO", then may proceed with Cephalosporin use.     SOCIAL HISTORY/FAMILY HISTORY   Reviewed in Epic:  Pertinent findings:  Social History   Tobacco Use   Smoking status: Never   Smokeless tobacco: Never  Vaping Use   Vaping Use: Never used  Substance Use Topics   Alcohol use: No   Drug use: No   Social History   Social History Narrative   Not on file    OBJCTIVE -PE, EKG, labs   Wt Readings from Last 3 Encounters:  01/02/22 151 lb 6.4 oz (68.7 kg)  07/26/21 150 lb 9.6 oz (68.3 kg)  07/25/21 148 lb 12.8 oz (67.5 kg)    Physical Exam: BP 138/60 (BP Location: Right Arm, Patient Position: Sitting, Cuff Size: Normal)   Pulse 60   Ht '5\' 2"'$  (1.575 m)   Wt 151 lb 6.4 oz (68.7 kg)   SpO2 95%   BMI 27.69 kg/m  Physical Exam Vitals reviewed.  Constitutional:      General: She is not in acute distress.    Appearance: She is normal weight. She is not ill-appearing (Chronically ill-appearing elderly woman.  Sitting in wheelchair.) or toxic-appearing.  HENT:     Head: Normocephalic and atraumatic.     Ears:     Comments: Extremely hard of hearing Neck:     Vascular: JVD (8-9 cm water.) present. No carotid bruit.  Cardiovascular:     Rate and Rhythm: Normal rate and regular rhythm. No extrasystoles are present.    Chest Wall: PMI is not displaced.     Pulses: Decreased pulses (Diminished but palpable with warm feet.).     Heart sounds: Heart sounds are distant. Murmur heard.     High-pitched blowing holosystolic murmur is present with a grade of 1/6 at the lower left sternal border and apex radiating to the axilla and apex.     No friction rub. Gallop present. S4 sounds present.     Comments: Normal S1 and split  S2. Pulmonary:     Effort: Pulmonary effort is normal. No respiratory distress.     Breath sounds: Normal breath sounds. No wheezing, rhonchi or rales.  Musculoskeletal:        General: Swelling (1-2+ bilateral) present.     Cervical back: Normal range of motion and neck supple.  Skin:    General: Skin is warm and dry.  Neurological:     General: No focal deficit present.     Mental Status: She is alert and oriented to person, place, and time.     Comments: Wheelchair-bound; somewhat tangential speech, difficult to really understand because of how hard of hearing she is.  Psychiatric:        Mood and Affect: Mood normal.        Behavior: Behavior normal.        Thought Content: Thought content normal.        Judgment: Judgment normal.     Adult ECG Report N/A  Recent Labs: None Lab Results  Component Value Date  CHOL 144 01/04/2021   HDL 43.60 01/04/2021   LDLCALC 86 01/04/2021   LDLDIRECT 163.3 01/30/2012   TRIG 75.0 01/04/2021   CHOLHDL 3 01/04/2021   Lab Results  Component Value Date   CREATININE 1.21 (H) 11/14/2021   BUN 32 11/14/2021   NA 139 11/14/2021   K 5.0 11/14/2021   CL 98 11/14/2021   CO2 26 11/14/2021      Latest Ref Rng & Units 06/21/2021    3:51 PM 01/04/2021    3:28 PM 06/29/2020    3:43 PM  CBC  WBC 4.0 - 10.5 K/uL 5.5  7.4  5.8   Hemoglobin 12.0 - 15.0 g/dL 13.4  13.5  12.0   Hematocrit 36.0 - 46.0 % 39.6  41.1  35.4   Platelets 150.0 - 400.0 K/uL 156.0  144.0  161.0     Lab Results  Component Value Date   HGBA1C 6.4 01/04/2021   Lab Results  Component Value Date   TSH 3.45 06/21/2021    ================================================== I spent a total of 53 minutes with the patient spent in direct patient consultation.  Additional time spent with chart review  / charting (studies, outside notes, etc): 16 min Total Time: 69 min  Current medicines are reviewed at length with the patient today.  (+/- concerns) n/a  Notice: This  dictation was prepared with Dragon dictation along with smart phrase technology. Any transcriptional errors that result from this process are unintentional and may not be corrected upon review.  Studies Ordered:   No orders of the defined types were placed in this encounter.  Meds ordered this encounter  Medications   losartan (COZAAR) 25 MG tablet    Sig: Take 2 tablets (50 mg total) by mouth every evening.    Dispense:  180 tablet    Refill:  3    Change in dose   metolazone (ZAROXOLYN) 2.5 MG tablet    Sig: TAKE 1 TABLET 3 TIMES WEEKLY (Monday,  Wednesday, Saturday)    Dispense:  36 tablet    Refill:  8    Patient Instructions / Medication Changes & Studies & Tests Ordered   Patient Instructions  Medication Instructions:     Can use  tylenol Pm or Low dose of Melonatin for sleep    Increase Losartan to 50 mg at bedtime ( 2 tablets of 25 mg)    Bisoprolol  2.5 mg  take in the morning  only     Start taking  Metolazone ( Zaroxolyn )    every  Monday  Wednesday and Saturday  30 min prior  morning dose of Furosemide   *If you need a refill on your cardiac medications before your next appointment, please call your pharmacy*   Lab Work: Not needed    Testing/Procedures:  Not needed  Follow-Up: At [facility], you and your health needs are our priority.  As part of our continuing mission to provide you with exceptional heart care, we have created designated Provider Care Teams.  These Care Teams include your primary Cardiologist (physician) and Advanced Practice Providers (APPs -  Physician Assistants and Nurse Practitioners) who all work together to provide you with the care you need, when you need it.     Your next appointment:   6 month(s)  The format for your next appointment:   In Person  Provider:   Glenetta Hew, MD         Leonie Man, MD, MS Glenetta Hew, M.D., M.S.  Interventional Cardiologist  [place]  Pager #  [phone] Phone # [phone] [address] [place], [place] [address]   Thank you for choosing [facility]!!

## 2022-01-02 NOTE — Patient Instructions (Addendum)
Description   Continue taking the dose you have been taking, which is, 2 tablets daily EXCEPT 2.5 tablets every Thursday. Recheck INR in 4 weeks. Coumadin Clinic [phone]

## 2022-01-02 NOTE — Patient Instructions (Addendum)
Medication Instructions:     Can use  tylenol Pm or Low dose of Melonatin for sleep    Increase Losartan to 50 mg at bedtime ( 2 tablets of 25 mg)    Bisoprolol  2.5 mg  take in the morning  only     Start taking  Metolazone ( Zaroxolyn )    every  Monday  Wednesday and Saturday  30 min prior  morning dose of Furosemide   *If you need a refill on your cardiac medications before your next appointment, please call your pharmacy*   Lab Work: Not needed    Testing/Procedures:  Not needed  Follow-Up: At [facility], you and your health needs are our priority.  As part of our continuing mission to provide you with exceptional heart care, we have created designated Provider Care Teams.  These Care Teams include your primary Cardiologist (physician) and Advanced Practice Providers (APPs -  Physician Assistants and Nurse Practitioners) who all work together to provide you with the care you need, when you need it.     Your next appointment:   6 month(s)  The format for your next appointment:   In Person  Provider:   Glenetta Hew, MD

## 2022-01-12 NOTE — Progress Notes (Signed)
Remote pacemaker transmission.   

## 2022-01-22 ENCOUNTER — Encounter: Payer: Self-pay | Admitting: Cardiology

## 2022-01-22 NOTE — Assessment & Plan Note (Signed)
My evaluation back in April, roughly 45% A-fib burden.  She does not notice a difference.  She is on low-dose Zebeta for A-fib rate control, but it basically has backup pacemaking.  Plan is to simply allow her to go into A-fib using beta-blocker for rate control with PPM to avoid bradycardia

## 2022-01-22 NOTE — Assessment & Plan Note (Signed)
Blood pressure is now stable on current dose of losartan and bisoprolol.  We will increase losartan to 50 mg daily for more active reduction.

## 2022-01-22 NOTE — Assessment & Plan Note (Signed)
Remains on warfarin again per patient choice.  If necessary, would be okay to hold warfarin without bridging.

## 2022-01-22 NOTE — Assessment & Plan Note (Addendum)
Again, EF reduction likely related to bundle branch block and paced rhythm with underlying A-fib.  Nonischemic Myoview. She is having class III symptoms now with orthopnea and edema.  Plan: Target weight is again back to the 145 pound range which 6 pounds down from now. Increase afterload reduction with losartan up to 50 mg daily.   Continue bisoprolol 2.5 mg, and spironolactone 25 mg daily. Continue taking furosemide daily but go back to taking the metolazone as a standing dose on Monday Wednesday Friday.

## 2022-01-22 NOTE — Assessment & Plan Note (Signed)
Severe due to valvular regurgitation with mitral and tricuspid valve.  With reduced EF, may actually be more significant.  Exacerbated by A-fib.  I wonder if she went into A-fib which triggered this most recent exacerbation.  Discussed with Dr. [name], but not likely getting Bhagat beneficial to perform a clip.  Essentially now are limited to palliation.  Symptomatic control.  In follow-up visit as needed to discuss EOL intentions.

## 2022-01-22 NOTE — Assessment & Plan Note (Signed)
Moderate LAD disease back in 2003 with a Myoview negative in 2013.  No active anginal symptoms.  No plans for further ischemic evaluation at this time.  She is on a beta-blocker.  Not on aspirin because of warfarin.  Not on statin per patient preference.

## 2022-01-22 NOTE — Assessment & Plan Note (Signed)
Combination of pacing with underlying left bundle branch block makes the QRS complex is wide which hinders septal motion exacerbates tricuspid and mitral vegetation as well as EF.  Not likely a candidate for CRT P

## 2022-02-01 ENCOUNTER — Ambulatory Visit: Payer: PPO | Attending: Cardiology

## 2022-02-01 DIAGNOSIS — I480 Paroxysmal atrial fibrillation: Secondary | ICD-10-CM

## 2022-02-01 DIAGNOSIS — I4819 Other persistent atrial fibrillation: Secondary | ICD-10-CM | POA: Diagnosis not present

## 2022-02-01 DIAGNOSIS — Z7901 Long term (current) use of anticoagulants: Secondary | ICD-10-CM | POA: Diagnosis not present

## 2022-02-01 LAB — POCT INR: INR: 3.2 — AB (ref 2.0–3.0)

## 2022-02-01 NOTE — Patient Instructions (Addendum)
Description   Today, eat greens and only take 2 tablets and then continue taking the dose you have been taking, which is, 2 tablets daily EXCEPT 2.5 tablets every Thursday. Recheck INR in 4 weeks. [facility] [phone]

## 2022-02-05 ENCOUNTER — Telehealth: Payer: Self-pay | Admitting: Internal Medicine

## 2022-02-05 NOTE — Telephone Encounter (Signed)
Patient would like a refill on her bisproprolol 5 mg. tablets

## 2022-02-05 NOTE — Telephone Encounter (Signed)
Dr. Ellyn Hack is prescriber.  Message left for Mickel Baas to contact cardiology for refill.  It looks like she should still have a refill left on medication as well.  Last one sent back in May with 3 refills.

## 2022-02-14 ENCOUNTER — Encounter: Payer: Self-pay | Admitting: Cardiology

## 2022-02-14 MED ORDER — BISOPROLOL FUMARATE 5 MG PO TABS: 2.5000 mg | ORAL_TABLET | Freq: Every day | ORAL | 2 refills | Status: DC

## 2022-03-06 ENCOUNTER — Ambulatory Visit: Payer: PPO | Attending: Internal Medicine

## 2022-03-06 DIAGNOSIS — Z5181 Encounter for therapeutic drug level monitoring: Secondary | ICD-10-CM | POA: Diagnosis not present

## 2022-03-06 DIAGNOSIS — I480 Paroxysmal atrial fibrillation: Secondary | ICD-10-CM

## 2022-03-06 DIAGNOSIS — I4819 Other persistent atrial fibrillation: Secondary | ICD-10-CM

## 2022-03-06 DIAGNOSIS — Z7901 Long term (current) use of anticoagulants: Secondary | ICD-10-CM

## 2022-03-06 LAB — POCT INR: INR: 3.8 — AB (ref 2.0–3.0)

## 2022-03-06 NOTE — Patient Instructions (Signed)
HOLD TONIGHT ONLY then continue taking the dose you have been taking, which is, 2 tablets daily EXCEPT 2.5 tablets every Thursday. Recheck INR in 4 weeks. [facility] [phone]

## 2022-03-19 ENCOUNTER — Ambulatory Visit (INDEPENDENT_AMBULATORY_CARE_PROVIDER_SITE_OTHER): Payer: PPO

## 2022-03-19 DIAGNOSIS — I480 Paroxysmal atrial fibrillation: Secondary | ICD-10-CM

## 2022-03-20 ENCOUNTER — Other Ambulatory Visit: Payer: Self-pay | Admitting: Cardiology

## 2022-03-20 LAB — CUP PACEART REMOTE DEVICE CHECK
Battery Remaining Longevity: 41 mo
Battery Remaining Percentage: 36 %
Battery Voltage: 2.95 V
Brady Statistic AP VP Percent: 67 %
Brady Statistic AP VS Percent: 0 %
Brady Statistic AS VP Percent: 33 %
Brady Statistic AS VS Percent: 0 %
Brady Statistic RA Percent Paced: 1 %
Brady Statistic RV Percent Paced: 96 %
Date Time Interrogation Session: 20240115150442
Implantable Lead Connection Status: 753985
Implantable Lead Connection Status: 753985
Implantable Lead Implant Date: 20180313
Implantable Lead Implant Date: 20180313
Implantable Lead Location: 753859
Implantable Lead Location: 753860
Implantable Pulse Generator Implant Date: 20180313
Lead Channel Impedance Value: 380 Ohm
Lead Channel Impedance Value: 390 Ohm
Lead Channel Pacing Threshold Amplitude: 1 V
Lead Channel Pacing Threshold Amplitude: 1.25 V
Lead Channel Pacing Threshold Pulse Width: 0.5 ms
Lead Channel Pacing Threshold Pulse Width: 0.5 ms
Lead Channel Sensing Intrinsic Amplitude: 1 mV
Lead Channel Sensing Intrinsic Amplitude: 8.7 mV
Lead Channel Setting Pacing Amplitude: 1.25 V
Lead Channel Setting Pacing Amplitude: 2.5 V
Lead Channel Setting Pacing Pulse Width: 0.5 ms
Lead Channel Setting Sensing Sensitivity: 4 mV
Pulse Gen Model: 2272
Pulse Gen Serial Number: 8005625

## 2022-04-03 ENCOUNTER — Ambulatory Visit: Payer: PPO | Attending: Cardiology | Admitting: *Deleted

## 2022-04-03 DIAGNOSIS — I4819 Other persistent atrial fibrillation: Secondary | ICD-10-CM

## 2022-04-03 DIAGNOSIS — I480 Paroxysmal atrial fibrillation: Secondary | ICD-10-CM | POA: Diagnosis not present

## 2022-04-03 DIAGNOSIS — Z7901 Long term (current) use of anticoagulants: Secondary | ICD-10-CM | POA: Diagnosis not present

## 2022-04-03 LAB — POCT INR: INR: 2.1 (ref 2.0–3.0)

## 2022-04-03 NOTE — Patient Instructions (Signed)
Description   Continue taking warfarin 2 tablets daily EXCEPT 2.5 tablets every Thursday. Recheck INR in 5 weeks. [facility] [phone]      

## 2022-04-13 ENCOUNTER — Other Ambulatory Visit: Payer: Self-pay | Admitting: Physician Assistant

## 2022-04-13 ENCOUNTER — Encounter: Payer: Self-pay | Admitting: Internal Medicine

## 2022-04-13 DIAGNOSIS — R1011 Right upper quadrant pain: Secondary | ICD-10-CM

## 2022-04-13 DIAGNOSIS — K219 Gastro-esophageal reflux disease without esophagitis: Secondary | ICD-10-CM

## 2022-04-28 ENCOUNTER — Other Ambulatory Visit: Payer: Self-pay | Admitting: Internal Medicine

## 2022-04-28 ENCOUNTER — Other Ambulatory Visit: Payer: Self-pay | Admitting: Cardiology

## 2022-04-30 NOTE — Telephone Encounter (Signed)
Called and spoke with daughter, Mickel Baas.  She will call back to schedule as she needed to check calendar and mom's appointments.  If she calls back okay to go ahead and schedule.

## 2022-05-07 ENCOUNTER — Encounter: Payer: Self-pay | Admitting: Internal Medicine

## 2022-05-07 ENCOUNTER — Other Ambulatory Visit: Payer: Self-pay | Admitting: Physician Assistant

## 2022-05-07 DIAGNOSIS — R1011 Right upper quadrant pain: Secondary | ICD-10-CM

## 2022-05-07 DIAGNOSIS — K219 Gastro-esophageal reflux disease without esophagitis: Secondary | ICD-10-CM

## 2022-05-07 NOTE — Patient Instructions (Addendum)
      Blood work was ordered.   The lab is on the first floor.    Medications changes include :       A referral was ordered for XXX.     Someone will call you to schedule an appointment.    Return in about 6 months (around 11/08/2022) for follow up.

## 2022-05-07 NOTE — Progress Notes (Unsigned)
Subjective:    Patient ID: Beverly Kim, female    DOB: 07/31/1924, 89 y.o.   MRN: [id number]     HPI Beverly Kim is here for follow up of her chronic medical problems, including afib, htn, hypothyroid, DM, neuropathy in feet, CKD, GERD  3-4 months ago fell and reinjured her left shoulder.  She has pain in her left upper arm - she has significant pain in the shoulder and upper arm.  The pain is constant - worse with movement.  Spasm in righthand and her finger will not move.  At times has pain in right distal forearm.  The pain is intermittent and is severe - she feels hears something in her head.   Soreness in right 5th finger and right middle finger.    At times arms and hands will go numb.    Itching and rash under breasts, groin, pubic region, under left arm  Upper back - certain area that hurts - feels like a briar sticking in there.    Left little toe - got a sore on it.  Topical antibacterial ointment with pain reliever helped.   Both knee very painful.  She thinks she may need injections.  She is in a lot of pain and hurts all the time.  She is not sleeping well and is tired all the time.  Medications and allergies reviewed with patient and updated if appropriate.  Current Outpatient Medications on File Prior to Visit  Medication Sig Dispense Refill   acetaminophen (TYLENOL) 650 MG CR tablet Take 650 mg by mouth every 8 (eight) hours as needed for pain.     bisoprolol (ZEBETA) 5 MG tablet Take 0.5 tablets (2.5 mg total) by mouth daily. 45 tablet 2   furosemide (LASIX) 40 MG tablet TAKE 1 TABLET BY MOUTH EVERY DAY 90 tablet 3   losartan (COZAAR) 25 MG tablet Take 2 tablets (50 mg total) by mouth every evening. 180 tablet 3   metolazone (ZAROXOLYN) 2.5 MG tablet TAKE 1 TABLET 3 TIMES WEEKLY (Monday,  Wednesday, Saturday) 36 tablet 8   ondansetron (ZOFRAN-ODT) 4 MG disintegrating tablet Take 4 mg by mouth every 8 (eight) hours as needed.     pantoprazole (PROTONIX) 40  MG tablet TAKE 1 TABLET BY MOUTH EVERY DAY 30 tablet 0   potassium chloride (KLOR-CON) 10 MEQ tablet TAKE 1 TABLET ( 10 MEQ total) BY MOUTH ONCE DAILY ON  DAYS  METOLAZONE  IS  TAKEN 30 tablet 11   spironolactone (ALDACTONE) 25 MG tablet TAKE 1/2 TABLET BY MOUTH EVERY DAY 30 tablet 5   SYNTHROID 100 MCG tablet TAKE 1 TABLET BY MOUTH EVERY DAY BEFORE BREAKFAST 90 tablet 1   warfarin (COUMADIN) 2.5 MG tablet TAKE 2 TABLETS DAILY OR AS PRESCRIBED BY COUMADIN CLINIC 180 tablet 1   No current facility-administered medications on file prior to visit.     Review of Systems  Constitutional:  Negative for fever.  HENT:  Positive for congestion and nosebleeds.   Respiratory:  Positive for cough (thick mucus in throat) and shortness of breath.   Cardiovascular:  Positive for leg swelling (mild). Negative for chest pain and palpitations.  Musculoskeletal:  Positive for arthralgias and back pain.  Skin:  Positive for rash.  Neurological:  Positive for dizziness (occ). Negative for headaches.       Objective:   Vitals:   05/08/22 1315  BP: 124/78  Pulse: 63  Temp: 98 F (36.7 C)  SpO2: 95%  BP Readings from Last 3 Encounters:  05/08/22 124/78  01/02/22 138/60  07/26/21 106/60   Wt Readings from Last 3 Encounters:  05/08/22 151 lb (68.5 kg)  01/02/22 151 lb 6.4 oz (68.7 kg)  07/26/21 150 lb 9.6 oz (68.3 kg)   Body mass index is 27.62 kg/m.    Physical Exam Constitutional:      General: She is not in acute distress.    Appearance: She is not ill-appearing.     Comments: Elderly female-appears mildly uncomfortable  HENT:     Head: Normocephalic and atraumatic.  Cardiovascular:     Rate and Rhythm: Normal rate and regular rhythm.  Pulmonary:     Effort: Pulmonary effort is normal. No respiratory distress.     Breath sounds: Normal breath sounds. No wheezing or rales.  Abdominal:     General: There is no distension.     Palpations: Abdomen is soft.     Tenderness: There is  no abdominal tenderness.  Musculoskeletal:     Right lower leg: No edema.     Left lower leg: No edema.  Skin:    General: Skin is warm and dry.     Findings: Rash (Erythematous rash under breasts, left axilla.  Did not examine lower abdomen or groin, but she states rash is similar) present.  Neurological:     Mental Status: She is alert.        Lab Results  Component Value Date   WBC 5.5 06/21/2021   HGB 13.4 06/21/2021   HCT 39.6 06/21/2021   PLT 156.0 06/21/2021   GLUCOSE 111 (H) 11/14/2021   CHOL 144 01/04/2021   TRIG 75.0 01/04/2021   HDL 43.60 01/04/2021   LDLDIRECT 163.3 01/30/2012   LDLCALC 86 01/04/2021   ALT 15 11/01/2021   AST 27 11/01/2021   NA 139 11/14/2021   K 5.0 11/14/2021   CL 98 11/14/2021   CREATININE 1.21 (H) 11/14/2021   BUN 32 11/14/2021   CO2 26 11/14/2021   TSH 3.45 06/21/2021   INR 2.1 04/03/2022   HGBA1C 6.4 01/04/2021     Assessment & Plan:    Discussed palliative care referral-discussed with her daughter to think about this.  Patient thought it would be similar to the [facility] people to come out to her house-discussed that we will be much different than that.  Daughter will think about this.  See Problem List for Assessment and Plan of chronic medical problems.

## 2022-05-08 ENCOUNTER — Ambulatory Visit (INDEPENDENT_AMBULATORY_CARE_PROVIDER_SITE_OTHER): Payer: PPO | Admitting: Cardiology

## 2022-05-08 ENCOUNTER — Encounter: Payer: Self-pay | Admitting: Internal Medicine

## 2022-05-08 ENCOUNTER — Ambulatory Visit (INDEPENDENT_AMBULATORY_CARE_PROVIDER_SITE_OTHER): Payer: PPO | Admitting: Internal Medicine

## 2022-05-08 ENCOUNTER — Ambulatory Visit (INDEPENDENT_AMBULATORY_CARE_PROVIDER_SITE_OTHER): Payer: PPO

## 2022-05-08 ENCOUNTER — Ambulatory Visit: Payer: PPO

## 2022-05-08 VITALS — BP 124/78 | HR 63 | Temp 98.0°F | Ht 62.0 in | Wt 151.0 lb

## 2022-05-08 DIAGNOSIS — M79641 Pain in right hand: Secondary | ICD-10-CM | POA: Diagnosis not present

## 2022-05-08 DIAGNOSIS — M79622 Pain in left upper arm: Secondary | ICD-10-CM | POA: Insufficient documentation

## 2022-05-08 DIAGNOSIS — I480 Paroxysmal atrial fibrillation: Secondary | ICD-10-CM

## 2022-05-08 DIAGNOSIS — B372 Candidiasis of skin and nail: Secondary | ICD-10-CM | POA: Diagnosis not present

## 2022-05-08 DIAGNOSIS — E038 Other specified hypothyroidism: Secondary | ICD-10-CM

## 2022-05-08 DIAGNOSIS — I10 Essential (primary) hypertension: Secondary | ICD-10-CM

## 2022-05-08 DIAGNOSIS — N1831 Chronic kidney disease, stage 3a: Secondary | ICD-10-CM | POA: Diagnosis not present

## 2022-05-08 DIAGNOSIS — M47812 Spondylosis without myelopathy or radiculopathy, cervical region: Secondary | ICD-10-CM | POA: Diagnosis not present

## 2022-05-08 DIAGNOSIS — G8929 Other chronic pain: Secondary | ICD-10-CM

## 2022-05-08 DIAGNOSIS — E1142 Type 2 diabetes mellitus with diabetic polyneuropathy: Secondary | ICD-10-CM | POA: Diagnosis not present

## 2022-05-08 DIAGNOSIS — N183 Chronic kidney disease, stage 3 unspecified: Secondary | ICD-10-CM

## 2022-05-08 DIAGNOSIS — Z5181 Encounter for therapeutic drug level monitoring: Secondary | ICD-10-CM

## 2022-05-08 DIAGNOSIS — E1122 Type 2 diabetes mellitus with diabetic chronic kidney disease: Secondary | ICD-10-CM

## 2022-05-08 DIAGNOSIS — M25531 Pain in right wrist: Secondary | ICD-10-CM | POA: Insufficient documentation

## 2022-05-08 DIAGNOSIS — R200 Anesthesia of skin: Secondary | ICD-10-CM | POA: Insufficient documentation

## 2022-05-08 DIAGNOSIS — M25562 Pain in left knee: Secondary | ICD-10-CM

## 2022-05-08 DIAGNOSIS — K219 Gastro-esophageal reflux disease without esophagitis: Secondary | ICD-10-CM | POA: Diagnosis not present

## 2022-05-08 DIAGNOSIS — M25561 Pain in right knee: Secondary | ICD-10-CM

## 2022-05-08 DIAGNOSIS — M25512 Pain in left shoulder: Secondary | ICD-10-CM

## 2022-05-08 DIAGNOSIS — Z7901 Long term (current) use of anticoagulants: Secondary | ICD-10-CM

## 2022-05-08 DIAGNOSIS — M19012 Primary osteoarthritis, left shoulder: Secondary | ICD-10-CM | POA: Diagnosis not present

## 2022-05-08 LAB — CBC WITH DIFFERENTIAL/PLATELET
Basophils Absolute: 0.1 10*3/uL (ref 0.0–0.1)
Basophils Relative: 0.8 % (ref 0.0–3.0)
Eosinophils Absolute: 0.2 10*3/uL (ref 0.0–0.7)
Eosinophils Relative: 2.7 % (ref 0.0–5.0)
HCT: 37 % (ref 36.0–46.0)
Hemoglobin: 12.6 g/dL (ref 12.0–15.0)
Lymphocytes Relative: 15.7 % (ref 12.0–46.0)
Lymphs Abs: 1.1 10*3/uL (ref 0.7–4.0)
MCHC: 34 g/dL (ref 30.0–36.0)
MCV: 97.1 fl (ref 78.0–100.0)
Monocytes Absolute: 0.6 10*3/uL (ref 0.1–1.0)
Monocytes Relative: 9.1 % (ref 3.0–12.0)
Neutro Abs: 5 10*3/uL (ref 1.4–7.7)
Neutrophils Relative %: 71.7 % (ref 43.0–77.0)
Platelets: 184 10*3/uL (ref 150.0–400.0)
RBC: 3.81 Mil/uL — ABNORMAL LOW (ref 3.87–5.11)
RDW: 14.9 % (ref 11.5–15.5)
WBC: 7 10*3/uL (ref 4.0–10.5)

## 2022-05-08 LAB — COMPREHENSIVE METABOLIC PANEL
ALT: 13 U/L (ref 0–35)
AST: 22 U/L (ref 0–37)
Albumin: 4 g/dL (ref 3.5–5.2)
Alkaline Phosphatase: 87 U/L (ref 39–117)
BUN: 52 mg/dL — ABNORMAL HIGH (ref 6–23)
CO2: 29 mEq/L (ref 19–32)
Calcium: 10.4 mg/dL (ref 8.4–10.5)
Chloride: 98 mEq/L (ref 96–112)
Creatinine, Ser: 1.3 mg/dL — ABNORMAL HIGH (ref 0.40–1.20)
GFR: 34.93 mL/min — ABNORMAL LOW (ref >60.00)
Glucose, Bld: 124 mg/dL — ABNORMAL HIGH (ref 70–99)
Potassium: 4.2 mEq/L (ref 3.5–5.1)
Sodium: 136 mEq/L (ref 135–145)
Total Bilirubin: 0.9 mg/dL (ref 0.2–1.2)
Total Protein: 7.6 g/dL (ref 6.0–8.3)

## 2022-05-08 LAB — LIPID PANEL
Cholesterol: 132 mg/dL (ref 0–200)
HDL: 39.5 mg/dL
LDL Cholesterol: 75 mg/dL (ref 0–99)
NonHDL: 92.66
Total CHOL/HDL Ratio: 3
Triglycerides: 86 mg/dL (ref 0.0–149.0)
VLDL: 17.2 mg/dL (ref 0.0–40.0)

## 2022-05-08 LAB — TSH: TSH: 9.45 u[IU]/mL — ABNORMAL HIGH (ref 0.35–5.50)

## 2022-05-08 LAB — PROTIME-INR
INR: 2.6 ratio — ABNORMAL HIGH (ref 0.8–1.0)
Prothrombin Time: 27.4 s — ABNORMAL HIGH (ref 9.6–13.1)

## 2022-05-08 LAB — HEMOGLOBIN A1C: Hgb A1c MFr Bld: 6.7 % — ABNORMAL HIGH (ref 4.6–6.5)

## 2022-05-08 MED ORDER — KETOCONAZOLE 2 % EX CREA: 1.0000 | TOPICAL_CREAM | Freq: Every day | CUTANEOUS | 0 refills | Status: DC

## 2022-05-08 MED ORDER — FLUCONAZOLE 150 MG PO TABS: 150.0000 mg | ORAL_TABLET | ORAL | 0 refills | Status: DC

## 2022-05-08 NOTE — Patient Instructions (Signed)
Description   Continue taking warfarin 2 tablets daily EXCEPT 2.5 tablets every Thursday. Recheck INR in 5 weeks. [facility] [phone]

## 2022-05-08 NOTE — Assessment & Plan Note (Signed)
Chronic Follows with podiatry Sugars well-controlled

## 2022-05-08 NOTE — Assessment & Plan Note (Signed)
New States intermittent bilateral arm and hand numbness/tingling Possible cervical radiculopathy Will check cervical x-ray

## 2022-05-08 NOTE — Assessment & Plan Note (Signed)
Chronic Blood pressure well controlled CMP Continue bisoprolol 2.5 mg daily, losartan 50 mg daily, spironolactone 12.5 mg daily

## 2022-05-08 NOTE — Assessment & Plan Note (Signed)
Chronic  Clinically euthyroid Check tsh and will titrate med dose if needed Currently taking Synthroid 100 mcg daily

## 2022-05-08 NOTE — Assessment & Plan Note (Signed)
New Pain after recent fall Will check x-ray May need to see orthopedics depending on x-ray results Continue Tylenol, but take regularly-okay to take up to 3000 mg a day

## 2022-05-08 NOTE — Assessment & Plan Note (Signed)
Chronic Increased pain Likely related to osteoarthritis Will refer to orthopedics-May benefit from a couple of injections

## 2022-05-08 NOTE — Assessment & Plan Note (Signed)
Chronic Following with cardiology Will check INR with blood work today so that she does not have to make a separate visit to the cardiology [facility] On bisoprolol 2.5 mg daily, warfarin

## 2022-05-08 NOTE — Assessment & Plan Note (Signed)
Experiencing right wrist pain with intermittent sharp, severe pain in the lateral aspect Will refer to orthopedics for further evaluation/treatment

## 2022-05-08 NOTE — Assessment & Plan Note (Signed)
Chronic GERD controlled Continue pantoprazole 40 mg daily

## 2022-05-08 NOTE — Assessment & Plan Note (Signed)
Chronic   Lab Results  Component Value Date   HGBA1C 6.4 01/04/2021   Sugars controlled Check A1c Continue diet control Stressed regular exercise, diabetic diet

## 2022-05-08 NOTE — Assessment & Plan Note (Addendum)
Chronic Stable, mild CMP, CBC

## 2022-05-08 NOTE — Assessment & Plan Note (Signed)
Acute Has intertrigo-likely yeast infection under breasts, left axilla, lower abdomen and groin Fluconazole 150 mg to 72 hours x 3 doses Ketoconazole cream daily until rash is resolved-then can use as needed

## 2022-05-09 MED ORDER — SYNTHROID 112 MCG PO TABS: 112.0000 ug | ORAL_TABLET | Freq: Every day | ORAL | 3 refills | Status: DC

## 2022-05-09 NOTE — Addendum Note (Signed)
Addended by: [name] on: 05/09/2022 12:29 PM   Modules accepted: Orders

## 2022-05-09 NOTE — Progress Notes (Signed)
Remote pacemaker transmission.   

## 2022-05-17 ENCOUNTER — Ambulatory Visit: Payer: PPO | Attending: Cardiology

## 2022-05-17 DIAGNOSIS — I4819 Other persistent atrial fibrillation: Secondary | ICD-10-CM

## 2022-05-17 DIAGNOSIS — Z5181 Encounter for therapeutic drug level monitoring: Secondary | ICD-10-CM

## 2022-05-17 LAB — POCT INR: INR: 3.4 — AB (ref 2.0–3.0)

## 2022-05-17 NOTE — Patient Instructions (Signed)
Description   HOLD tomorrow's dose and then continue taking warfarin 2 tablets daily EXCEPT 2.5 tablets every Thursday.  Recheck INR in 3 weeks ( Usually every 5 weeks).  [facility] [phone]

## 2022-05-23 DIAGNOSIS — M13831 Other specified arthritis, right wrist: Secondary | ICD-10-CM | POA: Diagnosis not present

## 2022-05-23 DIAGNOSIS — M13841 Other specified arthritis, right hand: Secondary | ICD-10-CM | POA: Diagnosis not present

## 2022-05-27 ENCOUNTER — Other Ambulatory Visit: Payer: Self-pay | Admitting: Cardiology

## 2022-05-31 ENCOUNTER — Other Ambulatory Visit: Payer: Self-pay

## 2022-05-31 ENCOUNTER — Other Ambulatory Visit: Payer: Self-pay | Admitting: Internal Medicine

## 2022-05-31 MED ORDER — ALBUTEROL SULFATE HFA 108 (90 BASE) MCG/ACT IN AERS: 1.0000 | INHALATION_SPRAY | Freq: Four times a day (QID) | RESPIRATORY_TRACT | 2 refills | Status: DC | PRN

## 2022-06-01 ENCOUNTER — Other Ambulatory Visit: Payer: Self-pay | Admitting: Physician Assistant

## 2022-06-01 DIAGNOSIS — K219 Gastro-esophageal reflux disease without esophagitis: Secondary | ICD-10-CM

## 2022-06-01 DIAGNOSIS — R1011 Right upper quadrant pain: Secondary | ICD-10-CM

## 2022-06-06 ENCOUNTER — Ambulatory Visit: Payer: PPO

## 2022-06-06 ENCOUNTER — Telehealth: Payer: Self-pay | Admitting: *Deleted

## 2022-06-06 NOTE — Telephone Encounter (Signed)
Pt left a voicemail stating she needs to cancel appt. Returned call to the pt and there was no answer therefore, left a message to call back to cancel.

## 2022-06-08 ENCOUNTER — Other Ambulatory Visit: Payer: Self-pay | Admitting: Cardiology

## 2022-06-08 DIAGNOSIS — I480 Paroxysmal atrial fibrillation: Secondary | ICD-10-CM

## 2022-06-15 DIAGNOSIS — M25561 Pain in right knee: Secondary | ICD-10-CM | POA: Diagnosis not present

## 2022-06-15 DIAGNOSIS — M25512 Pain in left shoulder: Secondary | ICD-10-CM | POA: Diagnosis not present

## 2022-06-15 DIAGNOSIS — M25562 Pain in left knee: Secondary | ICD-10-CM | POA: Diagnosis not present

## 2022-06-18 ENCOUNTER — Ambulatory Visit (INDEPENDENT_AMBULATORY_CARE_PROVIDER_SITE_OTHER): Payer: PPO

## 2022-06-18 ENCOUNTER — Telehealth: Payer: Self-pay | Admitting: *Deleted

## 2022-06-18 ENCOUNTER — Ambulatory Visit: Payer: PPO | Attending: Cardiology

## 2022-06-18 DIAGNOSIS — I442 Atrioventricular block, complete: Secondary | ICD-10-CM

## 2022-06-18 LAB — CUP PACEART REMOTE DEVICE CHECK
Battery Remaining Longevity: 39 mo
Battery Remaining Percentage: 33 %
Battery Voltage: 2.95 V
Brady Statistic AP VP Percent: 70 %
Brady Statistic AP VS Percent: 0 %
Brady Statistic AS VP Percent: 30 %
Brady Statistic AS VS Percent: 0 %
Brady Statistic RA Percent Paced: 1 %
Brady Statistic RV Percent Paced: 97 %
Date Time Interrogation Session: 20240415020018
Implantable Lead Connection Status: 753985
Implantable Lead Connection Status: 753985
Implantable Lead Implant Date: 20180313
Implantable Lead Implant Date: 20180313
Implantable Lead Location: 753859
Implantable Lead Location: 753860
Implantable Pulse Generator Implant Date: 20180313
Lead Channel Impedance Value: 430 Ohm
Lead Channel Impedance Value: 430 Ohm
Lead Channel Pacing Threshold Amplitude: 0.875 V
Lead Channel Pacing Threshold Amplitude: 1.25 V
Lead Channel Pacing Threshold Pulse Width: 0.5 ms
Lead Channel Pacing Threshold Pulse Width: 0.5 ms
Lead Channel Sensing Intrinsic Amplitude: 1 mV
Lead Channel Sensing Intrinsic Amplitude: 9.6 mV
Lead Channel Setting Pacing Amplitude: 1.125
Lead Channel Setting Pacing Amplitude: 2.5 V
Lead Channel Setting Pacing Pulse Width: 0.5 ms
Lead Channel Setting Sensing Sensitivity: 4 mV
Pulse Gen Model: 2272
Pulse Gen Serial Number: 8005625

## 2022-06-18 NOTE — Telephone Encounter (Signed)
Called pt since she missed today's appt. There was no answer therefore left a voicemail to call back to reschedule an appointment. Will await.

## 2022-06-28 ENCOUNTER — Ambulatory Visit: Payer: PPO | Attending: Cardiology | Admitting: *Deleted

## 2022-06-28 DIAGNOSIS — I480 Paroxysmal atrial fibrillation: Secondary | ICD-10-CM | POA: Diagnosis not present

## 2022-06-28 DIAGNOSIS — Z7901 Long term (current) use of anticoagulants: Secondary | ICD-10-CM | POA: Diagnosis not present

## 2022-06-28 DIAGNOSIS — I4819 Other persistent atrial fibrillation: Secondary | ICD-10-CM | POA: Diagnosis not present

## 2022-06-28 LAB — POCT INR: INR: 4.2 — AB (ref 2.0–3.0)

## 2022-06-28 NOTE — Patient Instructions (Addendum)
Description   Do not take any warfarin tomorrow and take 1 tablet on Saturday then continue taking warfarin 2 tablets daily EXCEPT 2.5 tablets every Thursday.  Recheck INR in 3 weeks ( Usually every 5 weeks).  [facility] [phone]

## 2022-07-09 ENCOUNTER — Encounter: Payer: Self-pay | Admitting: Internal Medicine

## 2022-07-12 ENCOUNTER — Other Ambulatory Visit (HOSPITAL_COMMUNITY): Payer: Self-pay

## 2022-07-18 ENCOUNTER — Ambulatory Visit: Payer: PPO | Attending: Cardiovascular Disease | Admitting: *Deleted

## 2022-07-18 ENCOUNTER — Encounter: Payer: Self-pay | Admitting: Cardiology

## 2022-07-18 DIAGNOSIS — I480 Paroxysmal atrial fibrillation: Secondary | ICD-10-CM | POA: Diagnosis not present

## 2022-07-18 DIAGNOSIS — I4819 Other persistent atrial fibrillation: Secondary | ICD-10-CM

## 2022-07-18 DIAGNOSIS — Z7901 Long term (current) use of anticoagulants: Secondary | ICD-10-CM | POA: Diagnosis not present

## 2022-07-18 LAB — POCT INR: INR: 2.2 (ref 2.0–3.0)

## 2022-07-18 NOTE — Patient Instructions (Addendum)
Description   Continue taking warfarin 2 tablets daily EXCEPT 2.5 tablets every Thursday.  Recheck INR in 4 weeks.  Eliquis 5mg  twice a day $47/month [facility] [phone]

## 2022-07-24 ENCOUNTER — Other Ambulatory Visit: Payer: Self-pay

## 2022-07-24 ENCOUNTER — Encounter: Payer: Self-pay | Admitting: Cardiology

## 2022-07-24 ENCOUNTER — Telehealth: Payer: Self-pay

## 2022-07-24 MED ORDER — APIXABAN 5 MG PO TABS: 5.0000 mg | ORAL_TABLET | Freq: Two times a day (BID) | ORAL | 0 refills | Status: DC

## 2022-07-24 NOTE — Telephone Encounter (Signed)
Patient is trying to decide if she wants to switch from Warfarin to Eliquis.    She had me send in a prescription so that she can check cost.  I let her know that I did this and she should let us know her decision.

## 2022-07-25 ENCOUNTER — Telehealth: Payer: Self-pay

## 2022-07-25 ENCOUNTER — Other Ambulatory Visit: Payer: Self-pay

## 2022-07-25 NOTE — Telephone Encounter (Signed)
I spoke to the patient's daughter [name]) and assisted with transition.  She verbalized understanding.

## 2022-07-26 ENCOUNTER — Other Ambulatory Visit: Payer: Self-pay | Admitting: Cardiology

## 2022-07-26 DIAGNOSIS — I480 Paroxysmal atrial fibrillation: Secondary | ICD-10-CM

## 2022-07-26 NOTE — Progress Notes (Signed)
Remote pacemaker transmission.   

## 2022-08-16 ENCOUNTER — Ambulatory Visit: Payer: PPO | Attending: Cardiology

## 2022-08-17 ENCOUNTER — Other Ambulatory Visit: Payer: Self-pay

## 2022-08-17 DIAGNOSIS — I4819 Other persistent atrial fibrillation: Secondary | ICD-10-CM

## 2022-08-17 MED ORDER — APIXABAN 5 MG PO TABS
5.00 mg | ORAL_TABLET | Freq: Two times a day (BID) | ORAL | 1 refills | Status: DC
Start: 2022-08-17 — End: 2022-09-07

## 2022-08-17 NOTE — Telephone Encounter (Signed)
Prescription refill request for Eliquis received. Indication: Afib  Last office visit: 01/02/22 Herbie [place])  Scr: 1.30 (05/08/22)  Age: 89 Weight: 68.5kg  Appropriate dose. Refill sent.

## 2022-08-22 DIAGNOSIS — Z719 Counseling, unspecified: Secondary | ICD-10-CM | POA: Diagnosis not present

## 2022-08-22 DIAGNOSIS — K625 Hemorrhage of anus and rectum: Secondary | ICD-10-CM | POA: Diagnosis not present

## 2022-09-02 ENCOUNTER — Emergency Department (HOSPITAL_COMMUNITY): Payer: PPO

## 2022-09-02 ENCOUNTER — Encounter (HOSPITAL_COMMUNITY): Payer: Self-pay

## 2022-09-02 ENCOUNTER — Inpatient Hospital Stay (HOSPITAL_COMMUNITY)
Admission: EM | Admit: 2022-09-02 | Discharge: 2022-09-07 | DRG: 378 | Disposition: A | Discharge status: Home Health | Payer: PPO | Attending: Internal Medicine | Admitting: Internal Medicine

## 2022-09-02 DIAGNOSIS — E1122 Type 2 diabetes mellitus with diabetic chronic kidney disease: Secondary | ICD-10-CM | POA: Diagnosis present

## 2022-09-02 DIAGNOSIS — D6869 Other thrombophilia: Secondary | ICD-10-CM | POA: Diagnosis present

## 2022-09-02 DIAGNOSIS — R6889 Other general symptoms and signs: Secondary | ICD-10-CM | POA: Diagnosis not present

## 2022-09-02 DIAGNOSIS — K921 Melena: Secondary | ICD-10-CM | POA: Diagnosis present

## 2022-09-02 DIAGNOSIS — E039 Hypothyroidism, unspecified: Secondary | ICD-10-CM | POA: Diagnosis present

## 2022-09-02 DIAGNOSIS — Z880 Allergy status to penicillin: Secondary | ICD-10-CM

## 2022-09-02 DIAGNOSIS — K641 Second degree hemorrhoids: Secondary | ICD-10-CM | POA: Diagnosis present

## 2022-09-02 DIAGNOSIS — Z79899 Other long term (current) drug therapy: Secondary | ICD-10-CM

## 2022-09-02 DIAGNOSIS — I480 Paroxysmal atrial fibrillation: Secondary | ICD-10-CM | POA: Diagnosis present

## 2022-09-02 DIAGNOSIS — Z8249 Family history of ischemic heart disease and other diseases of the circulatory system: Secondary | ICD-10-CM

## 2022-09-02 DIAGNOSIS — Z91040 Latex allergy status: Secondary | ICD-10-CM

## 2022-09-02 DIAGNOSIS — N3000 Acute cystitis without hematuria: Secondary | ICD-10-CM

## 2022-09-02 DIAGNOSIS — Z7901 Long term (current) use of anticoagulants: Secondary | ICD-10-CM | POA: Diagnosis not present

## 2022-09-02 DIAGNOSIS — Z90710 Acquired absence of both cervix and uterus: Secondary | ICD-10-CM

## 2022-09-02 DIAGNOSIS — D120 Benign neoplasm of cecum: Secondary | ICD-10-CM | POA: Diagnosis present

## 2022-09-02 DIAGNOSIS — D123 Benign neoplasm of transverse colon: Secondary | ICD-10-CM | POA: Diagnosis present

## 2022-09-02 DIAGNOSIS — Z950 Presence of cardiac pacemaker: Secondary | ICD-10-CM

## 2022-09-02 DIAGNOSIS — Z91048 Other nonmedicinal substance allergy status: Secondary | ICD-10-CM

## 2022-09-02 DIAGNOSIS — N1831 Chronic kidney disease, stage 3a: Secondary | ICD-10-CM | POA: Diagnosis present

## 2022-09-02 DIAGNOSIS — R404 Transient alteration of awareness: Secondary | ICD-10-CM | POA: Diagnosis not present

## 2022-09-02 DIAGNOSIS — R079 Chest pain, unspecified: Secondary | ICD-10-CM | POA: Diagnosis not present

## 2022-09-02 DIAGNOSIS — N179 Acute kidney failure, unspecified: Secondary | ICD-10-CM | POA: Diagnosis present

## 2022-09-02 DIAGNOSIS — Z833 Family history of diabetes mellitus: Secondary | ICD-10-CM

## 2022-09-02 DIAGNOSIS — R531 Weakness: Secondary | ICD-10-CM | POA: Diagnosis present

## 2022-09-02 DIAGNOSIS — H9193 Unspecified hearing loss, bilateral: Secondary | ICD-10-CM | POA: Diagnosis present

## 2022-09-02 DIAGNOSIS — I495 Sick sinus syndrome: Secondary | ICD-10-CM | POA: Diagnosis present

## 2022-09-02 DIAGNOSIS — K449 Diaphragmatic hernia without obstruction or gangrene: Secondary | ICD-10-CM | POA: Diagnosis present

## 2022-09-02 DIAGNOSIS — R0602 Shortness of breath: Secondary | ICD-10-CM | POA: Diagnosis not present

## 2022-09-02 DIAGNOSIS — E119 Type 2 diabetes mellitus without complications: Secondary | ICD-10-CM

## 2022-09-02 DIAGNOSIS — F419 Anxiety disorder, unspecified: Secondary | ICD-10-CM | POA: Diagnosis present

## 2022-09-02 DIAGNOSIS — R5383 Other fatigue: Secondary | ICD-10-CM | POA: Diagnosis not present

## 2022-09-02 DIAGNOSIS — K5521 Angiodysplasia of colon with hemorrhage: Principal | ICD-10-CM | POA: Diagnosis present

## 2022-09-02 DIAGNOSIS — I10 Essential (primary) hypertension: Secondary | ICD-10-CM | POA: Diagnosis present

## 2022-09-02 DIAGNOSIS — K219 Gastro-esophageal reflux disease without esophagitis: Secondary | ICD-10-CM | POA: Diagnosis present

## 2022-09-02 DIAGNOSIS — I4819 Other persistent atrial fibrillation: Secondary | ICD-10-CM

## 2022-09-02 DIAGNOSIS — Z881 Allergy status to other antibiotic agents status: Secondary | ICD-10-CM

## 2022-09-02 DIAGNOSIS — Z91013 Allergy to seafood: Secondary | ICD-10-CM

## 2022-09-02 DIAGNOSIS — R262 Difficulty in walking, not elsewhere classified: Secondary | ICD-10-CM | POA: Diagnosis present

## 2022-09-02 DIAGNOSIS — Z79890 Hormone replacement therapy (postmenopausal): Secondary | ICD-10-CM

## 2022-09-02 DIAGNOSIS — I700 Atherosclerosis of aorta: Secondary | ICD-10-CM | POA: Diagnosis not present

## 2022-09-02 DIAGNOSIS — I5042 Chronic combined systolic (congestive) and diastolic (congestive) heart failure: Secondary | ICD-10-CM | POA: Diagnosis present

## 2022-09-02 DIAGNOSIS — D62 Acute posthemorrhagic anemia: Secondary | ICD-10-CM

## 2022-09-02 DIAGNOSIS — E785 Hyperlipidemia, unspecified: Secondary | ICD-10-CM | POA: Diagnosis present

## 2022-09-02 DIAGNOSIS — E890 Postprocedural hypothyroidism: Secondary | ICD-10-CM | POA: Diagnosis present

## 2022-09-02 DIAGNOSIS — L299 Pruritus, unspecified: Secondary | ICD-10-CM | POA: Diagnosis present

## 2022-09-02 DIAGNOSIS — Z888 Allergy status to other drugs, medicaments and biological substances status: Secondary | ICD-10-CM

## 2022-09-02 DIAGNOSIS — I442 Atrioventricular block, complete: Secondary | ICD-10-CM | POA: Diagnosis present

## 2022-09-02 DIAGNOSIS — I2510 Atherosclerotic heart disease of native coronary artery without angina pectoris: Secondary | ICD-10-CM | POA: Diagnosis present

## 2022-09-02 DIAGNOSIS — K644 Residual hemorrhoidal skin tags: Secondary | ICD-10-CM | POA: Diagnosis present

## 2022-09-02 DIAGNOSIS — G4700 Insomnia, unspecified: Secondary | ICD-10-CM | POA: Diagnosis present

## 2022-09-02 DIAGNOSIS — G2581 Restless legs syndrome: Secondary | ICD-10-CM | POA: Diagnosis present

## 2022-09-02 DIAGNOSIS — I130 Hypertensive heart and chronic kidney disease with heart failure and stage 1 through stage 4 chronic kidney disease, or unspecified chronic kidney disease: Secondary | ICD-10-CM | POA: Diagnosis present

## 2022-09-02 DIAGNOSIS — E8770 Fluid overload, unspecified: Secondary | ICD-10-CM | POA: Diagnosis not present

## 2022-09-02 DIAGNOSIS — K922 Gastrointestinal hemorrhage, unspecified: Secondary | ICD-10-CM | POA: Diagnosis not present

## 2022-09-02 DIAGNOSIS — K625 Hemorrhage of anus and rectum: Secondary | ICD-10-CM

## 2022-09-02 DIAGNOSIS — N183 Chronic kidney disease, stage 3 unspecified: Secondary | ICD-10-CM | POA: Diagnosis present

## 2022-09-02 DIAGNOSIS — E1140 Type 2 diabetes mellitus with diabetic neuropathy, unspecified: Secondary | ICD-10-CM | POA: Diagnosis present

## 2022-09-02 DIAGNOSIS — K5909 Other constipation: Secondary | ICD-10-CM | POA: Diagnosis present

## 2022-09-02 DIAGNOSIS — I951 Orthostatic hypotension: Secondary | ICD-10-CM | POA: Diagnosis not present

## 2022-09-02 DIAGNOSIS — R195 Other fecal abnormalities: Secondary | ICD-10-CM | POA: Diagnosis not present

## 2022-09-02 DIAGNOSIS — Z885 Allergy status to narcotic agent status: Secondary | ICD-10-CM

## 2022-09-02 DIAGNOSIS — K5731 Diverticulosis of large intestine without perforation or abscess with bleeding: Secondary | ICD-10-CM | POA: Diagnosis present

## 2022-09-02 DIAGNOSIS — R109 Unspecified abdominal pain: Secondary | ICD-10-CM | POA: Diagnosis not present

## 2022-09-02 LAB — CBC
HCT: 31.6 % — ABNORMAL LOW (ref 36.0–46.0)
Hemoglobin: 10.2 g/dL — ABNORMAL LOW (ref 12.0–15.0)
MCH: 33.1 pg (ref 26.0–34.0)
MCHC: 32.3 g/dL (ref 30.0–36.0)
MCV: 102.6 fL — ABNORMAL HIGH (ref 80.0–100.0)
Platelets: 179 10*3/uL (ref 150–400)
RBC: 3.08 MIL/uL — ABNORMAL LOW (ref 3.87–5.11)
RDW: 15.9 % — ABNORMAL HIGH (ref 11.5–15.5)
WBC: 7.3 10*3/uL (ref 4.0–10.5)
nRBC: 0 % (ref 0.0–0.2)

## 2022-09-02 LAB — BASIC METABOLIC PANEL
Anion gap: 13 (ref 5–15)
BUN: 45 mg/dL — ABNORMAL HIGH (ref 8–23)
CO2: 25 mmol/L (ref 22–32)
Calcium: 9.5 mg/dL (ref 8.9–10.3)
Chloride: 96 mmol/L — ABNORMAL LOW (ref 98–111)
Creatinine, Ser: 1.59 mg/dL — ABNORMAL HIGH (ref 0.44–1.00)
GFR, Estimated: 30 mL/min — ABNORMAL LOW (ref >60)
Glucose, Bld: 124 mg/dL — ABNORMAL HIGH (ref 70–99)
Potassium: 3.9 mmol/L (ref 3.5–5.1)
Sodium: 134 mmol/L — ABNORMAL LOW (ref 135–145)

## 2022-09-02 LAB — HEPATIC FUNCTION PANEL
ALT: 11 U/L (ref 0–44)
AST: 31 U/L (ref 15–41)
Albumin: 3.1 g/dL — ABNORMAL LOW (ref 3.5–5.0)
Alkaline Phosphatase: 54 U/L (ref 38–126)
Bilirubin, Direct: 0.4 mg/dL — ABNORMAL HIGH (ref 0.0–0.2)
Indirect Bilirubin: 0.8 mg/dL (ref 0.3–0.9)
Total Bilirubin: 1.2 mg/dL (ref 0.3–1.2)
Total Protein: 5.5 g/dL — ABNORMAL LOW (ref 6.5–8.1)

## 2022-09-02 LAB — URINALYSIS, ROUTINE W REFLEX MICROSCOPIC
Bilirubin Urine: NEGATIVE
Glucose, UA: NEGATIVE mg/dL
Ketones, ur: NEGATIVE mg/dL
Nitrite: NEGATIVE
Protein, ur: NEGATIVE mg/dL
Specific Gravity, Urine: 1.012 (ref 1.005–1.030)
pH: 6 (ref 5.0–8.0)

## 2022-09-02 LAB — PROTIME-INR
INR: 1.5 — ABNORMAL HIGH (ref 0.8–1.2)
Prothrombin Time: 18.3 seconds — ABNORMAL HIGH (ref 11.4–15.2)

## 2022-09-02 LAB — HEMOGLOBIN AND HEMATOCRIT, BLOOD
HCT: 30.1 % — ABNORMAL LOW (ref 36.0–46.0)
Hemoglobin: 9.6 g/dL — ABNORMAL LOW (ref 12.0–15.0)

## 2022-09-02 LAB — TROPONIN I (HIGH SENSITIVITY)
Troponin I (High Sensitivity): 24 ng/L — ABNORMAL HIGH (ref <18)
Troponin I (High Sensitivity): 19 ng/L — ABNORMAL HIGH (ref <18)

## 2022-09-02 LAB — POC OCCULT BLOOD, ED

## 2022-09-02 LAB — BRAIN NATRIURETIC PEPTIDE: B Natriuretic Peptide: 436.2 pg/mL — ABNORMAL HIGH (ref 0.0–100.0)

## 2022-09-02 LAB — CBG MONITORING, ED: Glucose-Capillary: 131 mg/dL — ABNORMAL HIGH (ref 70–99)

## 2022-09-02 MED ORDER — SODIUM CHLORIDE 0.9 % IV SOLN: INTRAVENOUS | Status: DC

## 2022-09-02 MED ORDER — SODIUM CHLORIDE 0.9 % IV BOLUS
1000.0000 mL | Freq: Once | INTRAVENOUS | Status: AC
  Administered 2022-09-02: 1000 mL via INTRAVENOUS

## 2022-09-02 MED ORDER — PANTOPRAZOLE INFUSION (NEW) - SIMPLE MED
8.0000 mg/h | INTRAVENOUS | Status: DC
  Administered 2022-09-03: 8 mg/h via INTRAVENOUS
  Filled 2022-09-02 (×2): qty 100

## 2022-09-02 MED ORDER — PANTOPRAZOLE SODIUM 40 MG IV SOLR
40.0000 mg | Freq: Two times a day (BID) | INTRAVENOUS | Status: DC
  Administered 2022-09-06 – 2022-09-07 (×3): 40 mg via INTRAVENOUS
  Filled 2022-09-02 (×3): qty 10

## 2022-09-02 MED ORDER — LOSARTAN POTASSIUM 25 MG PO TABS
25.0000 mg | ORAL_TABLET | Freq: Every day | ORAL | Status: DC
  Administered 2022-09-03 – 2022-09-06 (×4): 25 mg via ORAL
  Filled 2022-09-02 (×4): qty 1

## 2022-09-02 MED ORDER — BISOPROLOL FUMARATE 5 MG PO TABS
2.5000 mg | ORAL_TABLET | Freq: Every day | ORAL | Status: DC
  Administered 2022-09-03 – 2022-09-07 (×5): 2.5 mg via ORAL
  Filled 2022-09-02 (×5): qty 1

## 2022-09-02 MED ORDER — SENNOSIDES-DOCUSATE SODIUM 8.6-50 MG PO TABS: 1.0000 | ORAL_TABLET | Freq: Every evening | ORAL | Status: DC | PRN

## 2022-09-02 MED ORDER — SPIRONOLACTONE 12.5 MG HALF TABLET: 12.5000 mg | ORAL_TABLET | Freq: Every day | ORAL | Status: DC

## 2022-09-02 MED ORDER — ACETAMINOPHEN 325 MG PO TABS
650.0000 mg | ORAL_TABLET | Freq: Four times a day (QID) | ORAL | Status: DC | PRN
  Administered 2022-09-04 – 2022-09-06 (×3): 650 mg via ORAL
  Filled 2022-09-02 (×3): qty 2

## 2022-09-02 MED ORDER — METOLAZONE 2.5 MG PO TABS
2.5000 mg | ORAL_TABLET | ORAL | Status: DC
  Administered 2022-09-03 – 2022-09-05 (×2): 2.5 mg via ORAL
  Filled 2022-09-02 (×3): qty 1

## 2022-09-02 MED ORDER — PANTOPRAZOLE SODIUM 40 MG IV SOLR
40.0000 mg | Freq: Once | INTRAVENOUS | Status: AC
  Administered 2022-09-02: 40 mg via INTRAVENOUS
  Filled 2022-09-02: qty 10

## 2022-09-02 MED ORDER — ACETAMINOPHEN 650 MG RE SUPP: 650.0000 mg | Freq: Four times a day (QID) | RECTAL | Status: DC | PRN

## 2022-09-02 MED ORDER — LEVOTHYROXINE SODIUM 112 MCG PO TABS
112.0000 ug | ORAL_TABLET | Freq: Every day | ORAL | Status: DC
  Administered 2022-09-03 – 2022-09-07 (×5): 112 ug via ORAL
  Filled 2022-09-02 (×5): qty 1

## 2022-09-02 MED ORDER — PANTOPRAZOLE 80MG IVPB - SIMPLE MED
80.0000 mg | Freq: Once | INTRAVENOUS | Status: AC
  Administered 2022-09-03: 80 mg via INTRAVENOUS
  Filled 2022-09-02 (×2): qty 100

## 2022-09-02 NOTE — ED Provider Notes (Signed)
[place] EMERGENCY DEPARTMENT AT [facility] Provider Note   CSN: [id number] Arrival date & time: 09/02/22  1927     History  Chief Complaint  Patient presents with   multiple complaints    Beverly Kim is a 89 y.o. female with history of hypertension, hyperlipidemia, hypothyroidism, GERD, CHF (LVEF 40 to 45%, echo 2023), paroxysmal atrial fibrillation on Xarelto, nonocclusive CAD, left bundle branch block, mitral valve prolapse, cardiac pacemaker, diabetes, CKD stage III, who presents the emergency department with multiple complaints.   Patient complaining of the following: Difficulty breathing, nausea, constipation, generalized itching, insomnia, difficulty urinating, nasal congestion, bilateral leg pain, and "feeling like something is going on with her heart".  Patient states that she went to the ER in [place] on 6/19 after she had a bowel movement with a significant amount of blood in the commode.  She states that they told her if she did not get better she would need a blood transfusion.  Since then she has not had any bright red blood, but for the past few days she has had a few bowel movements that looked dark in color.  She has been having generalized abdominal upset with nausea.  She feels as though she cannot catch her breath.  While she is not having any dysuria, she states that she frequently is going to the bathroom but cannot pee.  HPI     Home Medications Prior to Admission medications   Medication Sig Start Date End Date Taking? Authorizing Provider  acetaminophen (TYLENOL) 650 MG CR tablet Take 650 mg by mouth every 8 (eight) hours as needed for pain.    [provider]  albuterol (VENTOLIN HFA) 108 (90 Base) MCG/ACT inhaler Inhale 1-2 puffs into the lungs every 6 (six) hours as needed for wheezing or shortness of breath. 05/31/22   Burns, Bobette Mo, MD  apixaban (ELIQUIS) 5 MG TABS tablet Take 1 tablet (5 mg total) by mouth 2 (two) times daily.  08/17/22   Marykay Lex, MD  bisoprolol (ZEBETA) 5 MG tablet Take 0.5 tablets (2.5 mg total) by mouth daily. 02/14/22   Marykay Lex, MD  fluconazole (DIFLUCAN) 150 MG tablet Take 1 tablet (150 mg total) by mouth every 3 (three) days. 05/08/22   Pincus Sanes, MD  furosemide (LASIX) 40 MG tablet TAKE 1 TABLET BY MOUTH EVERY DAY 04/30/22   Marykay Lex, MD  ketoconazole (NIZORAL) 2 % cream Apply 1 Application topically daily. 05/08/22   Pincus Sanes, MD  losartan (COZAAR) 25 MG tablet TAKE 1 TABLET (25 MG TOTAL) BY MOUTH DAILY. 05/28/22   Marykay Lex, MD  metolazone (ZAROXOLYN) 2.5 MG tablet TAKE 1 TABLET 3 TIMES WEEKLY (Monday,  Wednesday, Saturday) 01/02/22   Marykay Lex, MD  ondansetron (ZOFRAN-ODT) 4 MG disintegrating tablet Take 4 mg by mouth every 8 (eight) hours as needed. 06/10/21   [provider]  pantoprazole (PROTONIX) 40 MG tablet TAKE 1 TABLET BY MOUTH EVERY DAY 06/04/22   Quentin Mulling R, PA-C  potassium chloride (KLOR-CON) 10 MEQ tablet TAKE 1 TABLET ( 10 MEQ total) BY MOUTH ONCE DAILY ON  DAYS  METOLAZONE  IS  TAKEN 10/25/21   Marykay Lex, MD  spironolactone (ALDACTONE) 25 MG tablet TAKE 1/2 TABLET BY MOUTH EVERY DAY 03/21/22   Marykay Lex, MD  SYNTHROID 112 MCG tablet Take 1 tablet (112 mcg total) by mouth daily before breakfast. 05/09/22   Pincus Sanes, MD  Allergies    Adhesive [tape], Ace inhibitors, Atorvastatin, Clindamycin, Codeine, Latex, Shellfish allergy, Simvastatin, and Penicillins    Review of Systems   Review of Systems  HENT:  Positive for congestion.   Respiratory:  Positive for shortness of breath.   Cardiovascular:  Positive for palpitations.  Gastrointestinal:  Positive for abdominal pain, blood in stool, constipation and nausea.  Genitourinary:  Positive for urgency.  Musculoskeletal:  Positive for myalgias.  Psychiatric/Behavioral:  Positive for sleep disturbance.   All other systems reviewed and are  negative.   Physical Exam Updated Vital Signs BP (!) 141/59   Pulse (!) 59   Temp 97.8 F (36.6 C) (Oral)   Resp 13   SpO2 100%  Physical Exam Vitals and nursing note reviewed.  Constitutional:      Appearance: Normal appearance.     Comments: Ill and frail appearing  HENT:     Head: Normocephalic and atraumatic.  Eyes:     Conjunctiva/sclera: Conjunctivae normal.  Cardiovascular:     Rate and Rhythm: Normal rate and regular rhythm.  Pulmonary:     Effort: Pulmonary effort is normal. No respiratory distress.     Breath sounds: Normal breath sounds.  Abdominal:     General: There is no distension.     Palpations: Abdomen is soft.     Tenderness: There is no abdominal tenderness.  Genitourinary:    Rectum: Guaiac result positive.  Musculoskeletal:     Right lower leg: 1+ Pitting Edema present.     Left lower leg: 1+ Pitting Edema present.  Skin:    General: Skin is warm and dry.     Coloration: Skin is jaundiced.  Neurological:     General: No focal deficit present.     Mental Status: She is alert.     ED Results / Procedures / Treatments   Labs (all labs ordered are listed, but only abnormal results are displayed) Labs Reviewed  BASIC METABOLIC PANEL - Abnormal; Notable for the following components:      Result Value   Sodium 134 (*)    Chloride 96 (*)    Glucose, Bld 124 (*)    BUN 45 (*)    Creatinine, Ser 1.59 (*)    GFR, Estimated 30 (*)    All other components within normal limits  CBC - Abnormal; Notable for the following components:   RBC 3.08 (*)    Hemoglobin 10.2 (*)    HCT 31.6 (*)    MCV 102.6 (*)    RDW 15.9 (*)    All other components within normal limits  URINALYSIS, ROUTINE W REFLEX MICROSCOPIC - Abnormal; Notable for the following components:   APPearance HAZY (*)    Hgb urine dipstick SMALL (*)    Leukocytes,Ua MODERATE (*)    Bacteria, UA MANY (*)    All other components within normal limits  CBG MONITORING, ED - Abnormal; Notable  for the following components:   Glucose-Capillary 131 (*)    All other components within normal limits  TROPONIN I (HIGH SENSITIVITY) - Abnormal; Notable for the following components:   Troponin I (High Sensitivity) 24 (*)    All other components within normal limits  PROTIME-INR  HEPATIC FUNCTION PANEL  BRAIN NATRIURETIC PEPTIDE  POC OCCULT BLOOD, ED  TROPONIN I (HIGH SENSITIVITY)    EKG EKG Interpretation Date/Time:  Sunday September 02 2022 19:38:24 EDT Ventricular Rate:  65 PR Interval:    QRS Duration:  154 QT Interval:  478 QTC  Calculation: 497 R Axis:   -82  Text Interpretation: Ventricular-paced rhythm Abnormal ECG No significant change since last tracing Confirmed by Ernie Avena ([id number]) on 09/02/2022 9:10:40 PM  Radiology DG Chest 2 View  Result Date: 09/02/2022 CLINICAL DATA:  Shortness of breath, fatigue EXAM: CHEST - 2 VIEW COMPARISON:  06/02/2020 FINDINGS: Calcified granuloma in the left mid lung. Lungs are otherwise clear. No pleural effusion or pneumothorax. Cardiomegaly. Left subclavian pacemaker. Thoracic aortic atherosclerosis. Mild degenerative changes of the left shoulder. Thoracic spine is within normal limits. IMPRESSION: Normal chest radiographs. Electronically Signed   By: Charline Bills M.D.   On: 09/02/2022 20:35    Procedures Procedures    Medications Ordered in ED Medications  sodium chloride 0.9 % bolus 1,000 mL (1,000 mLs Intravenous New Bag/Given 09/02/22 2136)    And  0.9 %  sodium chloride infusion (has no administration in time range)  pantoprazole (PROTONIX) injection 40 mg (40 mg Intravenous Given 09/02/22 2136)    ED Course/ Medical Decision Making/ A&P                             Medical Decision Making Amount and/or Complexity of Data Reviewed Labs: ordered.  Risk Prescription drug management. Decision regarding hospitalization.   This patient is a 89 y.o. female  who presents to the ED for concern of numerous complaints  including shortness of breath and dark stools.   Differential diagnoses prior to evaluation: The emergent differential diagnosis includes, but is not limited to, GI bleed, symptomatic anemia, sepsis, bowel obstruction. This is not an exhaustive differential.   Past Medical History / Co-morbidities / Social History: hypertension, hyperlipidemia, hypothyroidism, GERD, CHF (LVEF 40 to 45%, echo 2023), paroxysmal atrial fibrillation on Xarelto, nonocclusive CAD, left bundle branch block, mitral valve prolapse, cardiac pacemaker, diabetes, CKD stage III  Additional history: Chart reviewed. Pertinent results include: History of GI bleed in March 2022  Physical Exam: Physical exam performed. The pertinent findings include: Patient is ill-appearing.  Normal respiratory effort, lung sounds clear.  Feels very short of breath when laying down.  Abdomen soft and nontender.  Positive guaiac result on rectal exam.  Lab Tests/Imaging studies: I personally interpreted labs/imaging and the pertinent results include: No leukocytosis.  Hemoglobin 10.2, compared to 12.6 three months ago.  BMP with elevated creatinine compared to baseline at 1.59, GFR of 30.  Urinalysis with small hemoglobin, moderate leukocytes, many bacteria. Initial troponin 24, delta troponin pending.  Hemoccult positive.  Chest x-ray without acute abnormalities.  I agree with the radiologist interpretation.  Cardiac monitoring: EKG obtained and interpreted by myself and attending physician which shows: Ventricular paced rhythm   Medications: I ordered medication including IV fluids and Protonix.  I have reviewed the patients home medicines and have made adjustments as needed.  Consultations obtained: I consulted with the hospitalist Dr. Joneen Roach who will admit.  Disposition: After consideration of the diagnostic results and the patients response to treatment, I feel that patient would benefit from medical admission for symptomatic anemia  in the setting of likely GI bleed with positive Hemoccult, as well as acute kidney injury.  I discussed this case with my attending physician Dr. Karene Fry who cosigned this note including patient's presenting symptoms, physical exam, and planned diagnostics and interventions. Attending physician stated agreement with plan or made changes to plan which were implemented.   Final Clinical Impression(s) / ED Diagnoses Final diagnoses:  Gastrointestinal hemorrhage, unspecified gastrointestinal hemorrhage type  AKI (acute kidney injury) (HCC)  Guaiac positive stools    Rx / DC Orders ED Discharge Orders     None      Portions of this report may have been transcribed using voice recognition software. Every effort was made to ensure accuracy; however, inadvertent computerized transcription errors may be present.    Jeanella Flattery 09/02/22 2300    Ernie Avena, MD 09/02/22 [phone]

## 2022-09-02 NOTE — ED Provider Triage Note (Signed)
Emergency Medicine Provider Triage Evaluation Note  Beverly Kim , a 89 y.o. female  was evaluated in triage.  Pt complains of multiple complaints. Report feeling sick for more than a month.  Having constipation, nausea, sob, fatigue, trouble sleeping, body aches, difficulty urinating, having heart palpitation  Review of Systems  Positive: As above Negative: As above  Physical Exam  BP (!) 131/55 (BP Location: Right Arm)   Pulse 60   Temp 97.8 F (36.6 C) (Oral)   Resp 19   SpO2 99%  Gen:   Awake, no distress   Resp:  Normal effort  MSK:   Moves extremities without difficulty  Other:    Medical Decision Making  Medically screening exam initiated at 7:41 PM.  Appropriate orders placed.  Beverly Kim was informed that the remainder of the evaluation will be completed by another provider, this initial triage assessment does not replace that evaluation, and the importance of remaining in the ED until their evaluation is complete.     Fayrene Helper, PA-C 09/02/22 [phone]

## 2022-09-02 NOTE — H&P (Signed)
PCP:   Pincus Sanes, MD   Chief Complaint:  Feeling ill  HPI: This is a 89 year old female who presents with a myriad of chronic complaints.  She has a PMH of HTN, HLD,  hypothyroidism, GERD, CHF (LVEF 40 to 45%, echo 2023), paroxysmal atrial fibrillation on Eliquis, nonocclusive CAD, CHB sp PPM,  T2DM, CKD stage III, RLS, insomnia, chronic class III orthopnea, and chronic nausea. Approximately 2 weeks ago patient was visiting in [place], had blood in her stool and in the bowel.  She went to an urgent care where she was prescribed Anusol which helped.  The patient has chronic ill health, and feeling ill.  She has chronic orthopnea mostly sleeping upright, very hard time breathing at baseline, chronic nausea, insomnia and restless leg syndrome.  Patient states she always feels ill, she came to the ER hoping that we could do something to make her feel better.  She endorses abdominal cramps behind her bladder but no burning urination.  At baseline she has difficult time urinating only improved when she takes her metolazone. She denies fever, vomiting, or diarrhea.  Patient reports episodes of bleeding into her mouth your she eventually formed a blood clot as she spits out.  She endorses GERD/heartburn.  In the ER patient is Hemoccult positive.  UA possible infection, admission requested  Review of Systems:  Per HPI  Past Medical History: Past Medical History:  Diagnosis Date   Cardiac pacemaker 05/2016   Sick sinus syndrome/complete heart block   Chronic heart failure with preserved ejection fraction (HFpEF) (HCC)    Colon polyps    Diverticulosis    Dyspnea    GERD (gastroesophageal reflux disease)    Hearing loss of both ears    Hepatic cyst    Hiatal hernia    Hyperlipidemia    Hypertension    Hypothyroidism    Low back pain    Non-occlusive coronary artery disease 04/2001   Cardiac Cath 04/18/2001: 50 and 70% mid LAD after D1. = Nonischemic by Myoview.  Medical management.    Other left bundle branch block    PAF (paroxysmal atrial fibrillation) (HCC) 08/16/2016   observed on PPM interrogation, asymptomatic, chad2vasc score is 5.  Bisoprolol for rate control, Xarelto for anticoagulation.   URI (upper respiratory infection)    Past Surgical History:  Procedure Laterality Date   CARDIOVERSION N/A 01/16/2019   Procedure: CARDIOVERSION;  Surgeon: Wendall Stade, MD;  Location: [facility] ENDOSCOPY;  Service: Cardiovascular;  Laterality: N/A;   CHOLECYSTECTOMY N/A 12/15/2019   Procedure: LAPAROSCOPIC CHOLECYSTECTOMY;  Surgeon: Almond Lint, MD;  Location: [facility] OR;  Service: General;  Laterality: N/A;   COLONOSCOPY     ERCP N/A 07/21/2019   Procedure: ENDOSCOPIC RETROGRADE CHOLANGIOPANCREATOGRAPHY (ERCP);  Surgeon: Rachael Fee, MD;  Location: [facility] ENDOSCOPY;  Service: Endoscopy;  Laterality: N/A;   HERNIA REPAIR     LEFT HEART CATH AND CORONARY ANGIOGRAPHY  04/18/2001   Dr. Riley Kill: EF 60 to 65%.  2+ MR.  Upper LM takeoff with 90 degree bend just inside the ostium.  50-70% mid LAD after D1.  D1 is 30%.    Distal LAD wraps the apex and is free of disease.  LCx is mostly large OM branch.  RCA provides PDA and PL branches and Free of disease. -->  Presumably evaluated by Myoview that was nonischemic, therefore no PCI performed.   NM MYOVIEW LTD  01/2012   Normal EF.  Normal study.  No ischemia or infarction.  PACEMAKER IMPLANT Left 05/15/2016   SJM Assirity MRI dual chamber PPM implanted by Dr Johney Frame for CHB   REMOVAL OF STONES  07/21/2019   Procedure: REMOVAL OF STONES;  Surgeon: Rachael Fee, MD;  Location: [facility] ENDOSCOPY;  Service: Endoscopy;;   SPHINCTEROTOMY  07/21/2019   Procedure: Dennison Mascot;  Surgeon: Rachael Fee, MD;  Location: [facility] ENDOSCOPY;  Service: Endoscopy;;   THYROIDECTOMY     TONSILLECTOMY AND ADENOIDECTOMY     TOTAL ABDOMINAL HYSTERECTOMY W/ BILATERAL SALPINGOOPHORECTOMY     TRANSTHORACIC ECHOCARDIOGRAM  06/27/2016   Moderate basal-septal LVH,  EF 60 to 65%.  No R WMA.  GR 1 DD.  Mild aortic valve calcification.  No AS.  Trivial MR.--Indicated improved EF compared to prior study in 2015.   TRANSTHORACIC ECHOCARDIOGRAM  02/22/2012   12/'13 -a) EF 50 and 55%.  Normal function.-Moderate MR.  Mild LA dilation.; b) 11/2015Echo for shortness of breath => reduced EF of 45 to 50%.  Mild LVH.  Inferior reversible HK.  GRII DD.  Moderate MR.   TRANSTHORACIC ECHOCARDIOGRAM  10/2019   EF 60 -65%.  No RWMA.  Severe concentric LVH.  Unable to assess diastolic function.  Mild biatrial dilation.  Moderate MR moderate TR, no AS   TRANSTHORACIC ECHOCARDIOGRAM  06/15/2021   EF 40 to 45%.  Paradoxical septal motion from pacemaker.  Mild concentric LVH.  Indeterminate diastolic pressure.  Severe biatrial enlargement would argue significant diastolic dysfunction.  Severe MR and severe TR. => EF down from 60 to 65%.    Medications: Prior to Admission medications   Medication Sig Start Date End Date Taking? Authorizing Provider  acetaminophen (TYLENOL) 650 MG CR tablet Take 650 mg by mouth every 8 (eight) hours as needed for pain.    [provider]  albuterol (VENTOLIN HFA) 108 (90 Base) MCG/ACT inhaler Inhale 1-2 puffs into the lungs every 6 (six) hours as needed for wheezing or shortness of breath. 05/31/22   Burns, Bobette Mo, MD  apixaban (ELIQUIS) 5 MG TABS tablet Take 1 tablet (5 mg total) by mouth 2 (two) times daily. 08/17/22   Marykay Lex, MD  bisoprolol (ZEBETA) 5 MG tablet Take 0.5 tablets (2.5 mg total) by mouth daily. 02/14/22   Marykay Lex, MD  fluconazole (DIFLUCAN) 150 MG tablet Take 1 tablet (150 mg total) by mouth every 3 (three) days. 05/08/22   Pincus Sanes, MD  furosemide (LASIX) 40 MG tablet TAKE 1 TABLET BY MOUTH EVERY DAY 04/30/22   Marykay Lex, MD  ketoconazole (NIZORAL) 2 % cream Apply 1 Application topically daily. 05/08/22   Pincus Sanes, MD  losartan (COZAAR) 25 MG tablet TAKE 1 TABLET (25 MG TOTAL) BY MOUTH  DAILY. 05/28/22   Marykay Lex, MD  metolazone (ZAROXOLYN) 2.5 MG tablet TAKE 1 TABLET 3 TIMES WEEKLY (Monday,  Wednesday, Saturday) 01/02/22   Marykay Lex, MD  ondansetron (ZOFRAN-ODT) 4 MG disintegrating tablet Take 4 mg by mouth every 8 (eight) hours as needed. 06/10/21   [provider]  pantoprazole (PROTONIX) 40 MG tablet TAKE 1 TABLET BY MOUTH EVERY DAY 06/04/22   Quentin Mulling R, PA-C  potassium chloride (KLOR-CON) 10 MEQ tablet TAKE 1 TABLET ( 10 MEQ total) BY MOUTH ONCE DAILY ON  DAYS  METOLAZONE  IS  TAKEN 10/25/21   Marykay Lex, MD  spironolactone (ALDACTONE) 25 MG tablet TAKE 1/2 TABLET BY MOUTH EVERY DAY 03/21/22   Marykay Lex, MD  SYNTHROID 112 MCG  tablet Take 1 tablet (112 mcg total) by mouth daily before breakfast. 05/09/22   Pincus Sanes, MD    Allergies:   Allergies  Allergen Reactions   Adhesive [Tape] Itching, Dermatitis, Rash and Other (See Comments)    Blisters and "skin bubbles"   Ace Inhibitors Cough   Atorvastatin Other (See Comments)    REACTION: Reaction not known   Clindamycin Other (See Comments)    Unknown   Codeine Other (See Comments)    hallucinations    Latex Other (See Comments)    blisters   Shellfish Allergy Other (See Comments)    "gallbladder attack"   Simvastatin Other (See Comments)    fatigue   Penicillins Rash    Has patient had a PCN reaction causing immediate rash, facial/tongue/throat swelling, SOB or lightheadedness with hypotension: Unknown Has patient had a PCN reaction causing severe rash involving mucus membranes or skin necrosis: Unknown Has patient had a PCN reaction that required hospitalization: Unknown Has patient had a PCN reaction occurring within the last 10 years: No If all of the above answers are "NO", then may proceed with Cephalosporin use.     Social History:  reports that she has never smoked. She has never used smokeless tobacco. She reports that she does not drink alcohol and does not use  drugs.  Family History: Family History  Problem Relation Age of Onset   Coronary artery disease Other        family hx of 1st degree relative <50   Diabetes Other        family hx of   Other Other        cardiovascular disorder family hx of   Other Other        neurological disorder family hx of   Other Other        respiratory disease family hx of   Heart attack Daughter    Heart Problems Other        all children   Sudden death Son    Heart disease Brother    Heart disease Sister    Colon cancer Neg Hx    Colon polyps Neg Hx     Physical Exam: Vitals:   09/02/22 1928 09/02/22 2040 09/02/22 2100  BP: (!) 131/55 135/84 (!) 141/59  Pulse: 60 60 (!) 59  Resp: 19 13 13   Temp: 97.8 F (36.6 C)    TempSrc: Oral    SpO2: 99% 100% 100%    General:  A&O x3, elderly, chronically ill appearing patient Eyes: Pink conjunctiva, no scleral icterus ENT: Moist oral mucosa, neck supple, no thyromegaly Lungs: clear to ascultation, no wheeze, no crackles, no use of accessory muscles Cardiovascular: RRR, no regurgitation, no murmurs. No carotid bruits, ++ JVD Abdomen: soft, positive BS, non-tender, non-distended, no organomegaly, not an acute abdomen GU: not examined Neuro: CN II - XII grossly intact, sensation intact Musculoskeletal: strength 5/5 all extremities, no clubbing, cyanosis or edema Skin: no rash, no subcutaneous crepitation, no decubitus Psych: Miserable unhappy female    Labs on Admission:  Recent Labs    09/02/22 2001  NA 134*  K 3.9  CL 96*  CO2 25  GLUCOSE 124*  BUN 45*  CREATININE 1.59*  CALCIUM 9.5    Recent Labs    09/02/22 2001  WBC 7.3  HGB 10.2*  HCT 31.6*  MCV 102.6*  PLT 179    Radiological Exams on Admission: DG Chest 2 View  Result Date: 09/02/2022 CLINICAL DATA:  Shortness of breath, fatigue EXAM: CHEST - 2 VIEW COMPARISON:  06/02/2020 FINDINGS: Calcified granuloma in the left mid lung. Lungs are otherwise clear. No pleural effusion  or pneumothorax. Cardiomegaly. Left subclavian pacemaker. Thoracic aortic atherosclerosis. Mild degenerative changes of the left shoulder. Thoracic spine is within normal limits. IMPRESSION: Normal chest radiographs. Electronically Signed   By: Charline Bills M.D.   On: 09/02/2022 20:35    Assessment/Plan Present on Admission:  GI bleed // Heme + stools -Protonix drip initiated -Serial H&H -Eliquis on hold, probably best discontinued -Discussed with patient and her son who was present at bedside given patient's significant orthopnea and breathing difficulties, she is not a prime candidate for procedures including EGD/colonoscopy. ?  Flexible sigmoidoscopy.  Endoscopy capsule study discussed.  Son seems to have continued interest in discovering cause of bleed. Will contact GI.   Anemia d/t acute bleed -See above  GERD -Patient's last surgery [facility] 4/23 for severe GERD.  Started on Protonix twice daily, later decreased to daily.  Would increase back to twice daily.   -Patient with esophagram in 2015 that shows hiatal hernia with extensive gastric esophageal reflux.  No stricture.  Possible acute cystitis -Repeat UA, In-N-Out cath ordered.  Possible contaminated urine -Blood cultures x 2 ordered -IV Rocephin  Chronic combined systolic and diastolic CH, EF 40 to 45%  -Cozaar, Zebeta resumed   CKD (chronic kidney disease) stage 3, GFR 30-59 ml/min -Stable at baseline.  Avoid nephrotoxic medications   Essential hypertension -Stable, Cozaar and Zebeta resumed   Hypothyroidism -Stable, Synthroid resumed   Paroxysmal atrial fibrillation (HCC)  Secondary hypercoagulable state (HCC) -Eliquis on hold.  Continue Zebeta   CODE STATUS: Full code  Lucky Alverson 09/02/2022, 10:05 PM

## 2022-09-02 NOTE — ED Notes (Signed)
ED TO INPATIENT HANDOFF REPORT  ED Nurse Name and Phone #: [phone]  S Name/Age/Gender Beverly Kim 89 y.o. female Room/Bed: 026C/026C  Code Status   Code Status: Prior  Home/SNF/Other Home Patient oriented to: self, place, time, and situation Is this baseline? Yes   Triage Complete: Triage complete  Chief Complaint GI bleed [K92.2]  Triage Note Daughter made list including  Can't breathe  Severe nausea Severe constipation  Itching Can't sleep Hard to urinate  Feels something going on with her heart   Other symptoms present is nasal congestion and legs hurt  Medic vitals   144/58 68hr 98% ra 158bgl  18   Allergies Allergies  Allergen Reactions   Adhesive [Tape] Itching, Dermatitis, Rash and Other (See Comments)    Blisters and "skin bubbles"   Ace Inhibitors Cough   Atorvastatin Other (See Comments)    REACTION: Reaction not known   Clindamycin Other (See Comments)    Unknown   Codeine Other (See Comments)    hallucinations    Latex Other (See Comments)    blisters   Shellfish Allergy Other (See Comments)    "gallbladder attack"   Simvastatin Other (See Comments)    fatigue   Penicillins Rash    Has patient had a PCN reaction causing immediate rash, facial/tongue/throat swelling, SOB or lightheadedness with hypotension: Unknown Has patient had a PCN reaction causing severe rash involving mucus membranes or skin necrosis: Unknown Has patient had a PCN reaction that required hospitalization: Unknown Has patient had a PCN reaction occurring within the last 10 years: No If all of the above answers are "NO", then may proceed with Cephalosporin use.     Level of Care/Admitting Diagnosis ED Disposition     ED Disposition  Admit   Condition  --   Comment  Hospital Area: [facility] [[id number]]  Level of Care: Telemetry Cardiac [103]  May place patient in observation at [facility] or [facility] if equivalent level of care is  available:: No  Covid Evaluation: Asymptomatic - no recent exposure (last 10 days) testing not required  Diagnosis: GI bleed [213086]  Admitting Physician: Gery Pray [[id number]]  Attending Physician: Alvester Chou          B Medical/Surgery History Past Medical History:  Diagnosis Date   Cardiac pacemaker 05/2016   Sick sinus syndrome/complete heart block   Chronic heart failure with preserved ejection fraction (HFpEF) (HCC)    Colon polyps    Diverticulosis    Dyspnea    GERD (gastroesophageal reflux disease)    Hearing loss of both ears    Hepatic cyst    Hiatal hernia    Hyperlipidemia    Hypertension    Hypothyroidism    Low back pain    Non-occlusive coronary artery disease 04/2001   Cardiac Cath 04/18/2001: 50 and 70% mid LAD after D1. = Nonischemic by Myoview.  Medical management.   Other left bundle branch block    PAF (paroxysmal atrial fibrillation) (HCC) 08/16/2016   observed on PPM interrogation, asymptomatic, chad2vasc score is 5.  Bisoprolol for rate control, Xarelto for anticoagulation.   URI (upper respiratory infection)    Past Surgical History:  Procedure Laterality Date   CARDIOVERSION N/A 01/16/2019   Procedure: CARDIOVERSION;  Surgeon: Wendall Stade, MD;  Location: [facility] ENDOSCOPY;  Service: Cardiovascular;  Laterality: N/A;   CHOLECYSTECTOMY N/A 12/15/2019   Procedure: LAPAROSCOPIC CHOLECYSTECTOMY;  Surgeon: Almond Lint, MD;  Location: [facility] OR;  Service: General;  Laterality: N/A;   COLONOSCOPY     ERCP N/A 07/21/2019   Procedure: ENDOSCOPIC RETROGRADE CHOLANGIOPANCREATOGRAPHY (ERCP);  Surgeon: Rachael Fee, MD;  Location: [facility] ENDOSCOPY;  Service: Endoscopy;  Laterality: N/A;   HERNIA REPAIR     LEFT HEART CATH AND CORONARY ANGIOGRAPHY  04/18/2001   Dr. Riley Kill: EF 60 to 65%.  2+ MR.  Upper LM takeoff with 90 degree bend just inside the ostium.  50-70% mid LAD after D1.  D1 is 30%.    Distal LAD wraps the apex and is free of disease.  LCx  is mostly large OM branch.  RCA provides PDA and PL branches and Free of disease. -->  Presumably evaluated by Myoview that was nonischemic, therefore no PCI performed.   NM MYOVIEW LTD  01/2012   Normal EF.  Normal study.  No ischemia or infarction.   PACEMAKER IMPLANT Left 05/15/2016   SJM Assirity MRI dual chamber PPM implanted by Dr Johney Frame for CHB   REMOVAL OF STONES  07/21/2019   Procedure: REMOVAL OF STONES;  Surgeon: Rachael Fee, MD;  Location: [facility] ENDOSCOPY;  Service: Endoscopy;;   SPHINCTEROTOMY  07/21/2019   Procedure: Dennison Mascot;  Surgeon: Rachael Fee, MD;  Location: [facility] ENDOSCOPY;  Service: Endoscopy;;   THYROIDECTOMY     TONSILLECTOMY AND ADENOIDECTOMY     TOTAL ABDOMINAL HYSTERECTOMY W/ BILATERAL SALPINGOOPHORECTOMY     TRANSTHORACIC ECHOCARDIOGRAM  06/27/2016   Moderate basal-septal LVH, EF 60 to 65%.  No R WMA.  GR 1 DD.  Mild aortic valve calcification.  No AS.  Trivial MR.--Indicated improved EF compared to prior study in 2015.   TRANSTHORACIC ECHOCARDIOGRAM  02/22/2012   12/'13 -a) EF 50 and 55%.  Normal function.-Moderate MR.  Mild LA dilation.; b) 11/2015Echo for shortness of breath => reduced EF of 45 to 50%.  Mild LVH.  Inferior reversible HK.  GRII DD.  Moderate MR.   TRANSTHORACIC ECHOCARDIOGRAM  10/2019   EF 60 -65%.  No RWMA.  Severe concentric LVH.  Unable to assess diastolic function.  Mild biatrial dilation.  Moderate MR moderate TR, no AS   TRANSTHORACIC ECHOCARDIOGRAM  06/15/2021   EF 40 to 45%.  Paradoxical septal motion from pacemaker.  Mild concentric LVH.  Indeterminate diastolic pressure.  Severe biatrial enlargement would argue significant diastolic dysfunction.  Severe MR and severe TR. => EF down from 60 to 65%.     A IV Location/Drains/Wounds Patient Lines/Drains/Airways Status     Active Line/Drains/Airways     Name Placement date Placement time Site Days   Peripheral IV 09/02/22 20 G Anterior;Proximal;Right Forearm 09/02/22  2007   Forearm  less than 1   Incision (Closed) 05/15/16 Chest Left 05/15/16  1225  -- 2301   Incision (Closed) 12/15/19 Abdomen Other (Comment) 12/15/19  0846  -- 992   Incision - 4 Ports Abdomen 1: Umbilicus 2: Mid;Upper;Left 3: Right;Lateral;Lower Right;Upper 12/15/19  0837  -- 992   Wound / Incision (Open or Dehisced) 12/15/19 (MASD) Moisture Associated Skin Damage Pelvis Anterior;Bilateral yeast 12/15/19  1200  Pelvis  992            Intake/Output Last 24 hours No intake or output data in the 24 hours ending 09/02/22 2307  Labs/Imaging Results for orders placed or performed during the hospital encounter of 09/02/22 (from the past 48 hour(s))  Basic metabolic panel     Status: Abnormal   Collection Time: 09/02/22  8:01 PM  Result Value Ref Range   Sodium  134 (L) 135 - 145 mmol/L   Potassium 3.9 3.5 - 5.1 mmol/L   Chloride 96 (L) 98 - 111 mmol/L   CO2 25 22 - 32 mmol/L   Glucose, Bld 124 (H) 70 - 99 mg/dL    Comment: Glucose reference range applies only to samples taken after fasting for at least 8 hours.   BUN 45 (H) 8 - 23 mg/dL   Creatinine, Ser 1.61 (H) 0.44 - 1.00 mg/dL   Calcium 9.5 8.9 - 09.6 mg/dL   GFR, Estimated 30 (L) >60 mL/min    Comment: (NOTE) Calculated using the CKD-EPI Creatinine Equation (2021)    Anion gap 13 5 - 15    Comment: Performed at [facility], [address] [place], [place] [address]  CBC     Status: Abnormal   Collection Time: 09/02/22  8:01 PM  Result Value Ref Range   WBC 7.3 4.0 - 10.5 K/uL   RBC 3.08 (L) 3.87 - 5.11 MIL/uL   Hemoglobin 10.2 (L) 12.0 - 15.0 g/dL   HCT 98.1 (L) 19.1 - 47.8 %   MCV 102.6 (H) 80.0 - 100.0 fL   MCH 33.1 26.0 - 34.0 pg   MCHC 32.3 30.0 - 36.0 g/dL   RDW 29.5 (H) 62.1 - 30.8 %   Platelets 179 150 - 400 K/uL   nRBC 0.0 0.0 - 0.2 %    Comment: Performed at [facility], [address], [place], [place] [address]  Troponin I (High Sensitivity)     Status: Abnormal   Collection Time: 09/02/22  8:01  PM  Result Value Ref Range   Troponin I (High Sensitivity) 24 (H) <18 ng/L    Comment: (NOTE) Elevated high sensitivity troponin I (hsTnI) values and significant  changes across serial measurements may suggest ACS but many other  chronic and acute conditions are known to elevate hsTnI results.  Refer to the "Links" section for chest pain algorithms and additional  guidance. Performed at [facility], [address], [place], [place] [address]   Brain natriuretic peptide     Status: Abnormal   Collection Time: 09/02/22  8:01 PM  Result Value Ref Range   B Natriuretic Peptide 436.2 (H) 0.0 - 100.0 pg/mL    Comment: Performed at [facility], [address] [place], [place] [address]  CBG monitoring, ED     Status: Abnormal   Collection Time: 09/02/22  8:40 PM  Result Value Ref Range   Glucose-Capillary 131 (H) 70 - 99 mg/dL    Comment: Glucose reference range applies only to samples taken after fasting for at least 8 hours.  Urinalysis, Routine w reflex microscopic -Urine, Clean Catch     Status: Abnormal   Collection Time: 09/02/22  9:00 PM  Result Value Ref Range   Color, Urine YELLOW YELLOW   APPearance HAZY (A) CLEAR   Specific Gravity, Urine 1.012 1.005 - 1.030   pH 6.0 5.0 - 8.0   Glucose, UA NEGATIVE NEGATIVE mg/dL   Hgb urine dipstick SMALL (A) NEGATIVE   Bilirubin Urine NEGATIVE NEGATIVE   Ketones, ur NEGATIVE NEGATIVE mg/dL   Protein, ur NEGATIVE NEGATIVE mg/dL   Nitrite NEGATIVE NEGATIVE   Leukocytes,Ua MODERATE (A) NEGATIVE   RBC / HPF 0-5 0 - 5 RBC/hpf   WBC, UA 11-20 0 - 5 WBC/hpf   Bacteria, UA MANY (A) NONE SEEN   Squamous Epithelial / HPF 11-20 0 - 5 /HPF   Hyaline  Casts, UA PRESENT     Comment: Performed at [facility], [address], [place], [place] [address]  POC occult blood, ED     Status: None   Collection Time: 09/02/22  9:23 PM  Result Value Ref Range   Positive    Troponin I (High Sensitivity)     Status: Abnormal    Collection Time: 09/02/22  9:39 PM  Result Value Ref Range   Troponin I (High Sensitivity) 19 (H) <18 ng/L    Comment: (NOTE) Elevated high sensitivity troponin I (hsTnI) values and significant  changes across serial measurements may suggest ACS but many other  chronic and acute conditions are known to elevate hsTnI results.  Refer to the "Links" section for chest pain algorithms and additional  guidance. Performed at [facility], [address] [place], [place] [address]   Protime-INR     Status: Abnormal   Collection Time: 09/02/22  9:39 PM  Result Value Ref Range   Prothrombin Time 18.3 (H) 11.4 - 15.2 seconds   INR 1.5 (H) 0.8 - 1.2    Comment: (NOTE) INR goal varies based on device and disease states. Performed at [facility], [address], [place], [place] [address]   Hepatic function panel     Status: Abnormal   Collection Time: 09/02/22  9:39 PM  Result Value Ref Range   Total Protein 5.5 (L) 6.5 - 8.1 g/dL   Albumin 3.1 (L) 3.5 - 5.0 g/dL   AST 31 15 - 41 U/L    Comment: HEMOLYSIS AT THIS LEVEL MAY AFFECT RESULT   ALT 11 0 - 44 U/L    Comment: HEMOLYSIS AT THIS LEVEL MAY AFFECT RESULT   Alkaline Phosphatase 54 38 - 126 U/L   Total Bilirubin 1.2 0.3 - 1.2 mg/dL    Comment: HEMOLYSIS AT THIS LEVEL MAY AFFECT RESULT   Bilirubin, Direct 0.4 (H) 0.0 - 0.2 mg/dL   Indirect Bilirubin 0.8 0.3 - 0.9 mg/dL    Comment: Performed at [facility], [address], [place], [place] [address]   DG Chest 2 View  Result Date: 09/02/2022 CLINICAL DATA:  Shortness of breath, fatigue EXAM: CHEST - 2 VIEW COMPARISON:  06/02/2020 FINDINGS: Calcified granuloma in the left mid lung. Lungs are otherwise clear. No pleural effusion or pneumothorax. Cardiomegaly. Left subclavian pacemaker. Thoracic aortic atherosclerosis. Mild degenerative changes of the left shoulder. Thoracic spine is within normal limits. IMPRESSION: Normal chest radiographs. Electronically Signed   By:  Charline Bills M.D.   On: 09/02/2022 20:35    Pending Labs Unresulted Labs (From admission, onward)     Start     Ordered   09/02/22 2304  Urine Culture  Once,   R       Question:  Indication  Answer:  Dysuria   09/02/22 2303   09/02/22 2224  Culture, blood (Routine X 2) w Reflex to ID Panel  BLOOD CULTURE X 2,   R (with STAT occurrences)      09/02/22 2223            Vitals/Pain Today's Vitals   09/02/22 2100 09/02/22 2200 09/02/22 2215 09/02/22 2230  BP: (!) 141/59 138/63  138/74  Pulse: (!) 59 (!) 59 62 65  Resp: 13 18 17 19   Temp:      TempSrc:      SpO2: 100% 100% 95% 96%  PainSc:        Isolation Precautions No active  isolations  Medications Medications  sodium chloride 0.9 % bolus 1,000 mL (1,000 mLs Intravenous New Bag/Given 09/02/22 2136)    And  0.9 %  sodium chloride infusion (has no administration in time range)  pantoprazole (PROTONIX) injection 40 mg (40 mg Intravenous Given 09/02/22 2136)    Mobility walks with device     Focused Assessments     R Recommendations: See Admitting Provider Note  Report given to:   Additional Notes: HOH  / from home, lives with daughter / son at bedside / A&O / on RA / continent

## 2022-09-02 NOTE — ED Triage Notes (Signed)
Daughter made list including  Can't breathe  Severe nausea Severe constipation  Itching Can't sleep Hard to urinate  Feels something going on with her heart   Other symptoms present is nasal congestion and legs hurt  Medic vitals   144/58 68hr 98% ra 158bgl  18

## 2022-09-03 ENCOUNTER — Other Ambulatory Visit: Payer: Self-pay

## 2022-09-03 DIAGNOSIS — R109 Unspecified abdominal pain: Secondary | ICD-10-CM | POA: Diagnosis not present

## 2022-09-03 DIAGNOSIS — R0689 Other abnormalities of breathing: Secondary | ICD-10-CM | POA: Diagnosis not present

## 2022-09-03 DIAGNOSIS — K6289 Other specified diseases of anus and rectum: Secondary | ICD-10-CM | POA: Diagnosis not present

## 2022-09-03 DIAGNOSIS — K625 Hemorrhage of anus and rectum: Secondary | ICD-10-CM

## 2022-09-03 DIAGNOSIS — D123 Benign neoplasm of transverse colon: Secondary | ICD-10-CM | POA: Diagnosis not present

## 2022-09-03 DIAGNOSIS — I10 Essential (primary) hypertension: Secondary | ICD-10-CM | POA: Diagnosis not present

## 2022-09-03 DIAGNOSIS — I951 Orthostatic hypotension: Secondary | ICD-10-CM | POA: Diagnosis not present

## 2022-09-03 DIAGNOSIS — D122 Benign neoplasm of ascending colon: Secondary | ICD-10-CM | POA: Diagnosis not present

## 2022-09-03 DIAGNOSIS — G4700 Insomnia, unspecified: Secondary | ICD-10-CM | POA: Diagnosis not present

## 2022-09-03 DIAGNOSIS — K644 Residual hemorrhoidal skin tags: Secondary | ICD-10-CM | POA: Diagnosis not present

## 2022-09-03 DIAGNOSIS — K5521 Angiodysplasia of colon with hemorrhage: Secondary | ICD-10-CM | POA: Diagnosis not present

## 2022-09-03 DIAGNOSIS — R195 Other fecal abnormalities: Secondary | ICD-10-CM | POA: Diagnosis not present

## 2022-09-03 DIAGNOSIS — K635 Polyp of colon: Secondary | ICD-10-CM | POA: Diagnosis not present

## 2022-09-03 DIAGNOSIS — I130 Hypertensive heart and chronic kidney disease with heart failure and stage 1 through stage 4 chronic kidney disease, or unspecified chronic kidney disease: Secondary | ICD-10-CM | POA: Diagnosis not present

## 2022-09-03 DIAGNOSIS — Z7901 Long term (current) use of anticoagulants: Secondary | ICD-10-CM | POA: Diagnosis not present

## 2022-09-03 DIAGNOSIS — D6869 Other thrombophilia: Secondary | ICD-10-CM | POA: Diagnosis not present

## 2022-09-03 DIAGNOSIS — D649 Anemia, unspecified: Secondary | ICD-10-CM

## 2022-09-03 DIAGNOSIS — K922 Gastrointestinal hemorrhage, unspecified: Secondary | ICD-10-CM | POA: Diagnosis not present

## 2022-09-03 DIAGNOSIS — E8770 Fluid overload, unspecified: Secondary | ICD-10-CM | POA: Diagnosis not present

## 2022-09-03 DIAGNOSIS — I442 Atrioventricular block, complete: Secondary | ICD-10-CM | POA: Diagnosis not present

## 2022-09-03 DIAGNOSIS — Z950 Presence of cardiac pacemaker: Secondary | ICD-10-CM | POA: Diagnosis not present

## 2022-09-03 DIAGNOSIS — E1140 Type 2 diabetes mellitus with diabetic neuropathy, unspecified: Secondary | ICD-10-CM | POA: Diagnosis not present

## 2022-09-03 DIAGNOSIS — N179 Acute kidney failure, unspecified: Secondary | ICD-10-CM | POA: Diagnosis not present

## 2022-09-03 DIAGNOSIS — I2510 Atherosclerotic heart disease of native coronary artery without angina pectoris: Secondary | ICD-10-CM | POA: Diagnosis not present

## 2022-09-03 DIAGNOSIS — K2961 Other gastritis with bleeding: Secondary | ICD-10-CM | POA: Diagnosis not present

## 2022-09-03 DIAGNOSIS — D120 Benign neoplasm of cecum: Secondary | ICD-10-CM | POA: Diagnosis not present

## 2022-09-03 DIAGNOSIS — E038 Other specified hypothyroidism: Secondary | ICD-10-CM | POA: Diagnosis not present

## 2022-09-03 DIAGNOSIS — I4891 Unspecified atrial fibrillation: Secondary | ICD-10-CM | POA: Diagnosis not present

## 2022-09-03 DIAGNOSIS — Z515 Encounter for palliative care: Secondary | ICD-10-CM | POA: Diagnosis not present

## 2022-09-03 DIAGNOSIS — I480 Paroxysmal atrial fibrillation: Secondary | ICD-10-CM | POA: Diagnosis not present

## 2022-09-03 DIAGNOSIS — E1142 Type 2 diabetes mellitus with diabetic polyneuropathy: Secondary | ICD-10-CM | POA: Diagnosis not present

## 2022-09-03 DIAGNOSIS — K5909 Other constipation: Secondary | ICD-10-CM

## 2022-09-03 DIAGNOSIS — N183 Chronic kidney disease, stage 3 unspecified: Secondary | ICD-10-CM | POA: Diagnosis not present

## 2022-09-03 DIAGNOSIS — Z79890 Hormone replacement therapy (postmenopausal): Secondary | ICD-10-CM | POA: Diagnosis not present

## 2022-09-03 DIAGNOSIS — E785 Hyperlipidemia, unspecified: Secondary | ICD-10-CM | POA: Diagnosis not present

## 2022-09-03 DIAGNOSIS — K641 Second degree hemorrhoids: Secondary | ICD-10-CM | POA: Diagnosis not present

## 2022-09-03 DIAGNOSIS — R0602 Shortness of breath: Secondary | ICD-10-CM | POA: Diagnosis not present

## 2022-09-03 DIAGNOSIS — N1831 Chronic kidney disease, stage 3a: Secondary | ICD-10-CM | POA: Diagnosis not present

## 2022-09-03 DIAGNOSIS — I428 Other cardiomyopathies: Secondary | ICD-10-CM | POA: Diagnosis not present

## 2022-09-03 DIAGNOSIS — E782 Mixed hyperlipidemia: Secondary | ICD-10-CM | POA: Diagnosis not present

## 2022-09-03 DIAGNOSIS — K449 Diaphragmatic hernia without obstruction or gangrene: Secondary | ICD-10-CM

## 2022-09-03 DIAGNOSIS — K921 Melena: Secondary | ICD-10-CM | POA: Diagnosis not present

## 2022-09-03 DIAGNOSIS — I495 Sick sinus syndrome: Secondary | ICD-10-CM | POA: Diagnosis not present

## 2022-09-03 DIAGNOSIS — I700 Atherosclerosis of aorta: Secondary | ICD-10-CM | POA: Diagnosis not present

## 2022-09-03 DIAGNOSIS — I5042 Chronic combined systolic (congestive) and diastolic (congestive) heart failure: Secondary | ICD-10-CM | POA: Diagnosis not present

## 2022-09-03 DIAGNOSIS — K5731 Diverticulosis of large intestine without perforation or abscess with bleeding: Secondary | ICD-10-CM | POA: Diagnosis not present

## 2022-09-03 DIAGNOSIS — E890 Postprocedural hypothyroidism: Secondary | ICD-10-CM | POA: Diagnosis not present

## 2022-09-03 DIAGNOSIS — K5520 Angiodysplasia of colon without hemorrhage: Secondary | ICD-10-CM | POA: Diagnosis not present

## 2022-09-03 DIAGNOSIS — D126 Benign neoplasm of colon, unspecified: Secondary | ICD-10-CM | POA: Diagnosis not present

## 2022-09-03 DIAGNOSIS — K5730 Diverticulosis of large intestine without perforation or abscess without bleeding: Secondary | ICD-10-CM | POA: Diagnosis not present

## 2022-09-03 DIAGNOSIS — N3000 Acute cystitis without hematuria: Secondary | ICD-10-CM | POA: Diagnosis not present

## 2022-09-03 DIAGNOSIS — I517 Cardiomegaly: Secondary | ICD-10-CM | POA: Diagnosis not present

## 2022-09-03 DIAGNOSIS — G2581 Restless legs syndrome: Secondary | ICD-10-CM | POA: Diagnosis not present

## 2022-09-03 DIAGNOSIS — D62 Acute posthemorrhagic anemia: Secondary | ICD-10-CM | POA: Diagnosis not present

## 2022-09-03 DIAGNOSIS — E1122 Type 2 diabetes mellitus with diabetic chronic kidney disease: Secondary | ICD-10-CM | POA: Diagnosis not present

## 2022-09-03 LAB — GLUCOSE, CAPILLARY
Glucose-Capillary: 106 mg/dL — ABNORMAL HIGH (ref 70–99)
Glucose-Capillary: 144 mg/dL — ABNORMAL HIGH (ref 70–99)
Glucose-Capillary: 210 mg/dL — ABNORMAL HIGH (ref 70–99)

## 2022-09-03 LAB — IRON AND TIBC
Iron: 80 ug/dL (ref 28–170)
Saturation Ratios: 20 % (ref 10.4–31.8)
TIBC: 395 ug/dL (ref 250–450)
UIBC: 315 ug/dL

## 2022-09-03 LAB — BASIC METABOLIC PANEL
Anion gap: 13 (ref 5–15)
BUN: 42 mg/dL — ABNORMAL HIGH (ref 8–23)
CO2: 25 mmol/L (ref 22–32)
Calcium: 9 mg/dL (ref 8.9–10.3)
Chloride: 97 mmol/L — ABNORMAL LOW (ref 98–111)
Creatinine, Ser: 1.47 mg/dL — ABNORMAL HIGH (ref 0.44–1.00)
GFR, Estimated: 33 mL/min — ABNORMAL LOW (ref >60)
Glucose, Bld: 133 mg/dL — ABNORMAL HIGH (ref 70–99)
Potassium: 3.6 mmol/L (ref 3.5–5.1)
Sodium: 135 mmol/L (ref 135–145)

## 2022-09-03 LAB — VITAMIN B12: Vitamin B-12: 871 pg/mL (ref 180–914)

## 2022-09-03 LAB — HEMOGLOBIN AND HEMATOCRIT, BLOOD
HCT: 28.7 % — ABNORMAL LOW (ref 36.0–46.0)
HCT: 27.5 % — ABNORMAL LOW (ref 36.0–46.0)
Hemoglobin: 9.2 g/dL — ABNORMAL LOW (ref 12.0–15.0)
Hemoglobin: 8.9 g/dL — ABNORMAL LOW (ref 12.0–15.0)

## 2022-09-03 LAB — FERRITIN: Ferritin: 77 ng/mL (ref 11–307)

## 2022-09-03 LAB — TSH: TSH: 5.034 u[IU]/mL — ABNORMAL HIGH (ref 0.350–4.500)

## 2022-09-03 LAB — MAGNESIUM: Magnesium: 2.2 mg/dL (ref 1.7–2.4)

## 2022-09-03 MED ORDER — METOPROLOL TARTRATE 5 MG/5ML IV SOLN: 5.0000 mg | INTRAVENOUS | Status: DC | PRN

## 2022-09-03 MED ORDER — TRAZODONE HCL 50 MG PO TABS
50.0000 mg | ORAL_TABLET | Freq: Every evening | ORAL | Status: DC | PRN
  Administered 2022-09-05: 50 mg via ORAL
  Filled 2022-09-03 (×2): qty 1

## 2022-09-03 MED ORDER — IPRATROPIUM-ALBUTEROL 0.5-2.5 (3) MG/3ML IN SOLN
3.0000 mL | RESPIRATORY_TRACT | Status: DC | PRN
  Administered 2022-09-05 – 2022-09-06 (×2): 3 mL via RESPIRATORY_TRACT
  Filled 2022-09-03 (×3): qty 3

## 2022-09-03 MED ORDER — SODIUM CHLORIDE 0.9 % IV SOLN
1.0000 g | INTRAVENOUS | Status: DC
  Administered 2022-09-03 – 2022-09-06 (×5): 1 g via INTRAVENOUS
  Filled 2022-09-03 (×4): qty 10

## 2022-09-03 MED ORDER — FUROSEMIDE 10 MG/ML IJ SOLN
40.0000 mg | Freq: Once | INTRAMUSCULAR | Status: AC
  Administered 2022-09-03: 40 mg via INTRAVENOUS
  Filled 2022-09-03: qty 4

## 2022-09-03 MED ORDER — SENNOSIDES-DOCUSATE SODIUM 8.6-50 MG PO TABS
1.0000 | ORAL_TABLET | Freq: Every evening | ORAL | Status: DC | PRN
  Administered 2022-09-06: 1 via ORAL
  Filled 2022-09-03: qty 1

## 2022-09-03 MED ORDER — ONDANSETRON HCL 4 MG/2ML IJ SOLN: 4.0000 mg | Freq: Four times a day (QID) | INTRAMUSCULAR | Status: DC | PRN

## 2022-09-03 MED ORDER — IPRATROPIUM-ALBUTEROL 0.5-2.5 (3) MG/3ML IN SOLN
3.0000 mL | Freq: Three times a day (TID) | RESPIRATORY_TRACT | Status: DC
  Administered 2022-09-03 (×2): 3 mL via RESPIRATORY_TRACT
  Filled 2022-09-03 (×2): qty 3

## 2022-09-03 MED ORDER — GUAIFENESIN 100 MG/5ML PO LIQD: 5.0000 mL | ORAL | Status: DC | PRN

## 2022-09-03 MED ORDER — SODIUM CHLORIDE 0.9 % IV SOLN: INTRAVENOUS | Status: DC

## 2022-09-03 MED ORDER — HYDRALAZINE HCL 20 MG/ML IJ SOLN: 10.0000 mg | INTRAMUSCULAR | Status: DC | PRN

## 2022-09-03 MED ORDER — ORAL CARE MOUTH RINSE: 15.0000 mL | OROMUCOSAL | Status: DC | PRN

## 2022-09-03 MED ORDER — POLYETHYLENE GLYCOL 3350 17 G PO PACK
17.0000 g | PACK | Freq: Two times a day (BID) | ORAL | Status: DC
  Administered 2022-09-03 – 2022-09-04 (×3): 17 g via ORAL
  Filled 2022-09-03 (×3): qty 1

## 2022-09-03 NOTE — TOC Initial Note (Signed)
Transition of Care [facility]) - Initial/Assessment Note    Patient Details  Name: Beverly Kim MRN: [id number] Date of Birth: [birth year]  [facility] of [facility]) CM/SW Contact:    Leone Haven, RN Phone Number: 09/03/2022, 6:19 PM  Clinical Narrative:                 From home with daughter, [name].  She has PCP and insurance on file.  She currently has no HH services in place. She does have a walker at home.  Son, Elijah Birk is at the bedside,he states he or [name] will transport her home at dc.  They are her support system, she gets her medications [facility] on [address].  Pta she is self ambulatory with walker.  Expected Discharge Plan: Home/Self Care Barriers to Discharge: Continued Medical Work up   Patient Goals and CMS Choice Patient states their goals for this hospitalization and ongoing recovery are:: return home   Choice offered to / list presented to : NA      Expected Discharge Plan and Services In-house Referral: NA Discharge Planning Services: CM Consult Post Acute Care Choice: NA Living arrangements for the past 2 months: Single Family Home                 DME Arranged: N/A DME Agency: NA       HH Arranged: NA          Prior Living Arrangements/Services Living arrangements for the past 2 months: Single Family Home Lives with:: Adult Children Patient language and need for interpreter reviewed:: Yes Do you feel safe going back to the place where you live?: Yes      Need for Family Participation in Patient Care: Yes (Comment) Care giver support system in place?: Yes (comment) Current home services: DME (walker) Criminal Activity/Legal Involvement Pertinent to Current Situation/Hospitalization: No - Comment as needed  Activities of Daily Living Home Assistive Devices/Equipment: Walker (specify type) ADL Screening (condition at time of admission) Patient's cognitive ability adequate to safely complete daily activities?: Yes Is the patient deaf or  have difficulty hearing?: Yes Does the patient have difficulty seeing, even when wearing glasses/contacts?: No Does the patient have difficulty concentrating, remembering, or making decisions?: No Patient able to express need for assistance with ADLs?: Yes Does the patient have difficulty dressing or bathing?: Yes Independently performs ADLs?: No Communication: Independent Dressing (OT): Needs assistance Is this a change from baseline?: Change from baseline, expected to last >3 days Grooming: Independent Feeding: Independent Bathing: Needs assistance Is this a change from baseline?: Change from baseline, expected to last >3 days Toileting: Needs assistance Is this a change from baseline?: Change from baseline, expected to last >3days In/Out Bed: Needs assistance Is this a change from baseline?: Change from baseline, expected to last >3 days Walks in Home: Needs assistance Is this a change from baseline?: Change from baseline, expected to last >3 days Does the patient have difficulty walking or climbing stairs?: Yes Weakness of Legs: Both Weakness of Arms/Hands: None  Permission Sought/Granted                  Emotional Assessment Appearance:: Appears stated age Attitude/Demeanor/Rapport: Engaged Affect (typically observed): Appropriate Orientation: : Oriented to Self, Oriented to Place, Oriented to  Time, Oriented to Situation Alcohol / Substance Use: Not Applicable Psych Involvement: No (comment)  Admission diagnosis:  GI bleed [K92.2] Guaiac positive stools [R19.5] AKI (acute kidney injury) (HCC) [N17.9] Gastrointestinal hemorrhage, unspecified gastrointestinal hemorrhage type [K92.2]  Patient Active Problem List   Diagnosis Date Noted   Acute cystitis 09/02/2022   Yeast infection of the skin 05/08/2022   Left upper arm pain 05/08/2022   Arm numbness 05/08/2022   Right wrist pain 05/08/2022   Right hand pain 05/08/2022   Goals of care, counseling/discussion  06/20/2021   Blurry vision 01/04/2021   Diabetic neuropathy (HCC) 06/29/2020   CKD (chronic kidney disease) stage 3, GFR 30-59 ml/min (HCC) 06/28/2020   Rib pain on right side    GI bleed 05/31/2020   Cardiac pacemaker 09/17/2019   Preop cardiovascular exam 09/17/2019   Vertigo 09/12/2019   Unsteady gait when walking 09/12/2019   Dyshidrotic eczema 09/12/2019   Nausea 08/05/2019   Paroxysmal atrial fibrillation (HCC) 01/23/2019   Secondary hypercoagulable state (HCC) 01/23/2019   Fatigue 11/25/2018   Poor balance 10/22/2018   Bilateral hearing loss 10/21/2018   Pyelonephritis 07/25/2017   Carotid disease, bilateral (HCC) 04/08/2017   Chronic pain of both knees 04/08/2017   Non-rheumatic mitral regurgitation 07/12/2016   Complete heart block (HCC) 05/14/2016   Diabetes (HCC) 01/05/2016   Degenerative disc disease, cervical 08/23/2015   Degenerative disc disease, lumbar 08/23/2015   Left rotator cuff tear arthropathy 07/12/2015   Left shoulder pain 04/06/2015   Frozen shoulder 01/03/2015   Cough variant asthma 12/08/2013   Upper airway cough syndrome 10/27/2013   Dyspnea 02/03/2013   GERD 07/06/2009   Diaphragmatic hernia 07/06/2009   Diverticulosis of colon 07/06/2009   HEPATIC CYST 07/06/2009   COLONIC POLYPS, HX OF 07/06/2009   MIXED HEARING LOSS BILATERAL 12/13/2008   Essential hypertension 06/19/2008   MITRAL VALVE PROLAPSE 06/19/2008   LBBB (left bundle branch block) 06/19/2008   Hyperlipidemia 08/12/2007   Hypothyroidism 12/02/2006   Coronary artery disease involving native heart without angina pectoris 12/02/2006   Chronic combined systolic and diastolic CHF (congestive heart failure) (HCC) 12/02/2006   PCP:  Pincus Sanes, MD Pharmacy:   [id number] - [place] ([place]), [place] - [address]  ([place]) [place] [address] Phone: [phone] Fax: [phone]  [id number] [place], [place] - [address]  [place] [address] Phone: [phone] Fax: [phone]  [id number] - Closed - [place], [place] - [address] [facility] [address] [place] [address] Phone: [phone] Fax: [phone]     Social Determinants of Health (SDOH) Social History: SDOH Screenings   Food Insecurity: No Food Insecurity (09/03/2022)  Housing: Low Risk  (09/03/2022)  Transportation Needs: No Transportation Needs (09/03/2022)  Utilities: Not At Risk (09/03/2022)  Alcohol Screen: Low Risk  (09/11/2019)  Depression (PHQ2-9): Low Risk  (09/11/2019)  Financial Resource Strain: Low Risk  (09/11/2019)  Physical Activity: Sufficiently Active (08/19/2018)  Social Connections: Moderately Integrated (09/11/2019)  Stress: No Stress Concern Present (09/11/2019)  Tobacco Use: Low Risk  (09/02/2022)   SDOH Interventions:     Readmission Risk Interventions     No data to display

## 2022-09-03 NOTE — Consult Note (Addendum)
Consultation Note   Referring Provider:  [facility] PCP: Pincus Sanes, MD Primary Gastroenterologist: Lynann Bologna, MD        Reason for consultation: Heme + stool  DOA: 09/02/2022         Hospital Day: 2   Assessment and Plan   89 yo female with heme positive stool/ mild anemia / intermittent painless rectal bleeding in setting of constipation. Bleeding probably hemorrhoidal. Bleeding probably hemorrhoidal. Other etiologies such as polyp / neoplasm also possible.  Last colonoscopy in 2011 without polyps or cancer. Had a non-adenomatous "polyp".Seen at [facility] in [place] a couple of weeks ago for increased bleeding. Apparently prescribed Anusol but she does not recall.  -She is open to a colonoscopy when medically stable. Right now she complains of SHOB. Doesn't appear volume overloaded and CXR negative. TRH to order nebulizer, a dose of lasix. Can reassess tomorrow for possible colonoscopy on Wed and that will allow for eliquis washout.  -Treat constipation  -Will give diet today -check iron studies / b12 / folate  Chronic constipation - TRH just ordered MIralax for her. Nothing significant in anal vault on exam today  GERD with small hiatal hernia.  Maintained on daily Pantoprazole  A-fib, Eliquis on hold.   Chronic combined heart failure   CKD 3.  See PMH for additional medical history    History of Present Illness Patient is a 89 y.o. year old female whose past medical history includes but is not necessarily limited to GERD with HH, chronic combined heart failure, CKD 3, hypothyroidism, chronic A-Fib on Eliquis, hx of choledocholithiasis s/p CCY, colon polyps, severe sigmoid diverticulosis,  Patient came to ED today with multiple complaints. Amongst those complaints was blood in stool a couple of weeks ago while visiting [place]. Went to Urgent Care, given Anusol per H+P note but patient doesn't recall. She has been  having intermittent rectal bleeding with bowel movements for " quite some time" ( maybe a year? ). She complains of frequent constipation. She frequently has lower abdominal cramping independent of bowel movements. Her main complaint right now is that she is [facility]. Asking for something for her breathing.   In ED FOBT+ Hgb  10.2,  baseline 12.5 in March MCV 102.6 Creatinine 1.58, BUN 45 UA mod leukocytes, many bacteria CXR normal.   Today hgb 9.6 - after IVF BUN 42, Cr 1.47.   Labs and Imaging: Recent Labs    09/02/22 2001 09/02/22 2328  WBC 7.3  --   HGB 10.2* 9.6*  HCT 31.6* 30.1*  PLT 179  --    Recent Labs    09/02/22 2001 09/03/22 0101  NA 134* 135  K 3.9 3.6  CL 96* 97*  CO2 25 25  GLUCOSE 124* 133*  BUN 45* 42*  CREATININE 1.59* 1.47*  CALCIUM 9.5 9.0   Recent Labs    09/02/22 2139  PROT 5.5*  ALBUMIN 3.1*  AST 31  ALT 11  ALKPHOS 54  BILITOT 1.2  BILIDIR 0.4*  IBILI 0.8   No results for input(s): "HEPBSAG", "HCVAB", "HEPAIGM", "HEPBIGM" in the last 72 hours. Recent Labs    09/02/22 2139  LABPROT 18.3*  INR 1.5*    Previous GI Evaluation:  Last colonoscopy:  2011 Colonoscopy:  Good prep.  Severe sigmoid diverticulosis a/w luminal narrowing.  Sessile polyp at 15 cm.   Path: polypoid colorectal mucosa w intrmucosal lymphoid aggregates.   2004 EGD Normal   Principal Problem:   GI bleed Active Problems:   Hypothyroidism   Hyperlipidemia   Essential hypertension   Chronic combined systolic and diastolic CHF (congestive heart failure) (HCC)   Diabetes (HCC)   Complete heart block (HCC)   Paroxysmal atrial fibrillation (HCC)   Secondary hypercoagulable state (HCC)   CKD (chronic kidney disease) stage 3, GFR 30-59 ml/min (HCC)   Diabetic neuropathy (HCC)   Acute cystitis   Past Medical History:  Diagnosis Date   Cardiac pacemaker 05/2016   Sick sinus syndrome/complete heart block   Chronic heart failure with preserved ejection fraction  (HFpEF) (HCC)    Colon polyps    Diverticulosis    Dyspnea    GERD (gastroesophageal reflux disease)    Hearing loss of both ears    Hepatic cyst    Hiatal hernia    Hyperlipidemia    Hypertension    Hypothyroidism    Low back pain    Non-occlusive coronary artery disease 04/2001   Cardiac Cath 04/18/2001: 50 and 70% mid LAD after D1. = Nonischemic by Myoview.  Medical management.   Other left bundle branch block    PAF (paroxysmal atrial fibrillation) (HCC) 08/16/2016   observed on PPM interrogation, asymptomatic, chad2vasc score is 5.  Bisoprolol for rate control, Xarelto for anticoagulation.   URI (upper respiratory infection)     Past Surgical History:  Procedure Laterality Date   CARDIOVERSION N/A 01/16/2019   Procedure: CARDIOVERSION;  Surgeon: Wendall Stade, MD;  Location: [facility] ENDOSCOPY;  Service: Cardiovascular;  Laterality: N/A;   CHOLECYSTECTOMY N/A 12/15/2019   Procedure: LAPAROSCOPIC CHOLECYSTECTOMY;  Surgeon: Almond Lint, MD;  Location: [facility] OR;  Service: General;  Laterality: N/A;   COLONOSCOPY     ERCP N/A 07/21/2019   Procedure: ENDOSCOPIC RETROGRADE CHOLANGIOPANCREATOGRAPHY (ERCP);  Surgeon: Rachael Fee, MD;  Location: [facility] ENDOSCOPY;  Service: Endoscopy;  Laterality: N/A;   HERNIA REPAIR     LEFT HEART CATH AND CORONARY ANGIOGRAPHY  04/18/2001   Dr. Riley Kill: EF 60 to 65%.  2+ MR.  Upper LM takeoff with 90 degree bend just inside the ostium.  50-70% mid LAD after D1.  D1 is 30%.    Distal LAD wraps the apex and is free of disease.  LCx is mostly large OM branch.  RCA provides PDA and PL branches and Free of disease. -->  Presumably evaluated by Myoview that was nonischemic, therefore no PCI performed.   NM MYOVIEW LTD  01/2012   Normal EF.  Normal study.  No ischemia or infarction.   PACEMAKER IMPLANT Left 05/15/2016   SJM Assirity MRI dual chamber PPM implanted by Dr Johney Frame for CHB   REMOVAL OF STONES  07/21/2019   Procedure: REMOVAL OF STONES;  Surgeon:  Rachael Fee, MD;  Location: [facility] ENDOSCOPY;  Service: Endoscopy;;   SPHINCTEROTOMY  07/21/2019   Procedure: Dennison Mascot;  Surgeon: Rachael Fee, MD;  Location: [facility] ENDOSCOPY;  Service: Endoscopy;;   THYROIDECTOMY     TONSILLECTOMY AND ADENOIDECTOMY     TOTAL ABDOMINAL HYSTERECTOMY W/ BILATERAL SALPINGOOPHORECTOMY     TRANSTHORACIC ECHOCARDIOGRAM  06/27/2016   Moderate basal-septal LVH, EF 60 to 65%.  No R WMA.  GR 1 DD.  Mild aortic valve calcification.  No AS.  Trivial MR.--Indicated improved EF compared to prior  study in 2015.   TRANSTHORACIC ECHOCARDIOGRAM  02/22/2012   12/'13 -a) EF 50 and 55%.  Normal function.-Moderate MR.  Mild LA dilation.; b) 11/2015Echo for shortness of breath => reduced EF of 45 to 50%.  Mild LVH.  Inferior reversible HK.  GRII DD.  Moderate MR.   TRANSTHORACIC ECHOCARDIOGRAM  10/2019   EF 60 -65%.  No RWMA.  Severe concentric LVH.  Unable to assess diastolic function.  Mild biatrial dilation.  Moderate MR moderate TR, no AS   TRANSTHORACIC ECHOCARDIOGRAM  06/15/2021   EF 40 to 45%.  Paradoxical septal motion from pacemaker.  Mild concentric LVH.  Indeterminate diastolic pressure.  Severe biatrial enlargement would argue significant diastolic dysfunction.  Severe MR and severe TR. => EF down from 60 to 65%.    Family History  Problem Relation Age of Onset   Coronary artery disease Other        family hx of 1st degree relative <50   Diabetes Other        family hx of   Other Other        cardiovascular disorder family hx of   Other Other        neurological disorder family hx of   Other Other        respiratory disease family hx of   Heart attack Daughter    Heart Problems Other        all children   Sudden death Son    Heart disease Brother    Heart disease Sister    Colon cancer Neg Hx    Colon polyps Neg Hx     Prior to Admission medications   Medication Sig Start Date End Date Taking? Authorizing Provider  acetaminophen (TYLENOL) 650 MG  CR tablet Take 650 mg by mouth every 8 (eight) hours as needed for pain.    [provider]  albuterol (VENTOLIN HFA) 108 (90 Base) MCG/ACT inhaler Inhale 1-2 puffs into the lungs every 6 (six) hours as needed for wheezing or shortness of breath. 05/31/22   Burns, Bobette Mo, MD  apixaban (ELIQUIS) 5 MG TABS tablet Take 1 tablet (5 mg total) by mouth 2 (two) times daily. 08/17/22   Marykay Lex, MD  bisoprolol (ZEBETA) 5 MG tablet Take 0.5 tablets (2.5 mg total) by mouth daily. 02/14/22   Marykay Lex, MD  fluconazole (DIFLUCAN) 150 MG tablet Take 1 tablet (150 mg total) by mouth every 3 (three) days. 05/08/22   Pincus Sanes, MD  furosemide (LASIX) 40 MG tablet TAKE 1 TABLET BY MOUTH EVERY DAY 04/30/22   Marykay Lex, MD  ketoconazole (NIZORAL) 2 % cream Apply 1 Application topically daily. 05/08/22   Pincus Sanes, MD  losartan (COZAAR) 25 MG tablet TAKE 1 TABLET (25 MG TOTAL) BY MOUTH DAILY. 05/28/22   Marykay Lex, MD  metolazone (ZAROXOLYN) 2.5 MG tablet TAKE 1 TABLET 3 TIMES WEEKLY (Monday,  Wednesday, Saturday) 01/02/22   Marykay Lex, MD  ondansetron (ZOFRAN-ODT) 4 MG disintegrating tablet Take 4 mg by mouth every 8 (eight) hours as needed. 06/10/21   [provider]  pantoprazole (PROTONIX) 40 MG tablet TAKE 1 TABLET BY MOUTH EVERY DAY 06/04/22   Quentin Mulling R, PA-C  potassium chloride (KLOR-CON) 10 MEQ tablet TAKE 1 TABLET ( 10 MEQ total) BY MOUTH ONCE DAILY ON  DAYS  METOLAZONE  IS  TAKEN 10/25/21   Marykay Lex, MD  spironolactone (ALDACTONE) 25 MG tablet TAKE 1/2  TABLET BY MOUTH EVERY DAY 03/21/22   Marykay Lex, MD  SYNTHROID 112 MCG tablet Take 1 tablet (112 mcg total) by mouth daily before breakfast. 05/09/22   Pincus Sanes, MD    Current Facility-Administered Medications  Medication Dose Route Frequency Provider Last Rate Last Admin   0.9 %  sodium chloride infusion   Intravenous Continuous Roemhildt, Lorin T, PA-C 125 mL/hr at 09/03/22 0024 New  Bag at 09/03/22 0024   0.9 %  sodium chloride infusion   Intravenous Continuous Dimple Nanas, MD 75 mL/hr at 09/03/22 0935 New Bag at 09/03/22 0935   acetaminophen (TYLENOL) tablet 650 mg  650 mg Oral Q6H PRN Gery Pray, MD       Or   acetaminophen (TYLENOL) suppository 650 mg  650 mg Rectal Q6H PRN Joneen Roach, Debby, MD       bisoprolol (ZEBETA) tablet 2.5 mg  2.5 mg Oral Daily Crosley, Debby, MD   2.5 mg at 09/03/22 0927   cefTRIAXone (ROCEPHIN) 1 g in sodium chloride 0.9 % 100 mL IVPB  1 g Intravenous Q24H Crosley, Debby, MD 200 mL/hr at 09/03/22 0103 1 g at 09/03/22 0103   guaiFENesin (ROBITUSSIN) 100 MG/5ML liquid 5 mL  5 mL Oral Q4H PRN Amin, Ankit Chirag, MD       hydrALAZINE (APRESOLINE) injection 10 mg  10 mg Intravenous Q4H PRN Amin, Ankit Chirag, MD       ipratropium-albuterol (DUONEB) 0.5-2.5 (3) MG/3ML nebulizer solution 3 mL  3 mL Nebulization Q4H PRN Amin, Ankit Chirag, MD       levothyroxine (SYNTHROID) tablet 112 mcg  112 mcg Oral QAC breakfast Gery Pray, MD   112 mcg at 09/03/22 0533   losartan (COZAAR) tablet 25 mg  25 mg Oral Daily Crosley, Debby, MD   25 mg at 09/03/22 4098   metolazone (ZAROXOLYN) tablet 2.5 mg  2.5 mg Oral Once per day on Mon Wed Sat Gery Pray, MD   2.5 mg at 09/03/22 1191   metoprolol tartrate (LOPRESSOR) injection 5 mg  5 mg Intravenous Q4H PRN Amin, Ankit Chirag, MD       ondansetron (ZOFRAN) injection 4 mg  4 mg Intravenous Q6H PRN Amin, Loura Halt, MD       [START ON 09/06/2022] pantoprazole (PROTONIX) injection 40 mg  40 mg Intravenous Q12H Crosley, Debby, MD       senna-docusate (Senokot-S) tablet 1 tablet  1 tablet Oral QHS PRN Amin, Ankit Chirag, MD       traZODone (DESYREL) tablet 50 mg  50 mg Oral QHS PRN Dimple Nanas, MD        Allergies as of 09/02/2022 - Review Complete 09/02/2022  Allergen Reaction Noted   Adhesive [tape] Itching, Dermatitis, Rash, and Other (See Comments)    Ace inhibitors Cough 05/27/2016    Atorvastatin Other (See Comments)    Clindamycin Other (See Comments) 07/06/2009   Codeine Other (See Comments) 12/02/2006   Latex Other (See Comments) 08/12/2007   Shellfish allergy Other (See Comments) 07/25/2017   Simvastatin Other (See Comments) 07/06/2009   Penicillins Rash 12/02/2006    Social History   Socioeconomic History   Marital status: Widowed    Spouse name: Not on file   Number of children: 4   Years of education: Not on file   Highest education level: Not on file  Occupational History   Occupation: retired  Tobacco Use   Smoking status: Never   Smokeless tobacco: Never  Vaping Use  Vaping Use: Never used  Substance and Sexual Activity   Alcohol use: No   Drug use: No   Sexual activity: Never  Other Topics Concern   Not on file  Social History Narrative   Not on file   Social Determinants of Health   Financial Resource Strain: Low Risk  (09/11/2019)   Overall Financial Resource Strain (CARDIA)    Difficulty of Paying Living Expenses: Not hard at all  Food Insecurity: No Food Insecurity (09/03/2022)   Hunger Vital Sign    Worried About Running Out of Food in the Last Year: Never true    Ran Out of Food in the Last Year: Never true  Transportation Needs: No Transportation Needs (09/03/2022)   PRAPARE - Administrator, Civil Service (Medical): No    Lack of Transportation (Non-Medical): No  Physical Activity: Sufficiently Active (08/19/2018)   Exercise Vital Sign    Days of Exercise per Week: 5 days    Minutes of Exercise per Session: 40 min  Stress: No Stress Concern Present (09/11/2019)   [employer] of Occupational Health - Occupational Stress Questionnaire    Feeling of Stress : Not at all  Social Connections: Moderately Integrated (09/11/2019)   Social Connection and Isolation Panel [NHANES]    Frequency of Communication with Friends and Family: More than three times a week    Frequency of Social Gatherings with Friends and Family: More  than three times a week    Attends Religious Services: More than 4 times per year    Active Member of [employer] or Organizations: Yes    Attends Banker Meetings: More than 4 times per year    Marital Status: Widowed  Intimate Partner Violence: Patient Declined (09/03/2022)   Humiliation, Afraid, Rape, and Kick questionnaire    Fear of Current or Ex-Partner: Patient declined    Emotionally Abused: Patient declined    Physically Abused: Patient declined    Sexually Abused: Patient declined     Code Status   Code Status: Full Code  Review of Systems: All systems reviewed and negative except where noted in HPI.  Physical Exam: Vital signs in last 24 hours: Temp:  [97.6 F (36.4 C)-97.8 F (36.6 C)] 97.6 F (36.4 C) (07/01 0800) Pulse Rate:  [59-65] 65 (07/01 0800) Resp:  [13-22] 22 (07/01 0800) BP: (127-144)/(47-106) 128/47 (07/01 0800) SpO2:  [95 %-100 %] 98 % (07/01 0800) Weight:  [69.2 kg] 69.2 kg (07/01 0000)    General:  Pleasant obese female in NAD Psych:  Cooperative. Normal mood and affect Eyes: Pupils equal Ears:  Normal auditory acuity Nose: No deformity, discharge or lesions Neck:  Supple, no masses felt Lungs:  bibasilar crackles. .  Heart:  Regular rate  Abdomen:  Soft, nondistended, nontender, active bowel sounds, no masses felt Rectal  small pieces of light brown stool in vault. No masses felt.  MSK: Symmetrical without gross deformities.  Neurologic:  Alert, oriented, grossly normal neurologically Extremities : No edema Skin:  Intact without significant lesions.    Intake/Output from previous day: No intake/output data recorded. Intake/Output this shift:  Total I/O In: 260 [P.O.:260] Out: -      Willette Cluster, NP-C @  09/03/2022, 10:53 AM

## 2022-09-03 NOTE — Progress Notes (Addendum)
PROGRESS NOTE    Beverly Kim  NFA:[id number] DOB: [birth year] DOA: 09/02/2022 PCP: Pincus Sanes, MD   Brief Narrative:  89 year old with history of HTN, HLD, hypothyroidism, GERD, systolic CHF EF 45%, paroxysmal A-fib on Eliquis, nonocclusive CAD, complete heart block status post pacemaker, DM2, CKD stage III, RLS, insomnia, chronic orthopnea and nausea comes to the hospital with complaints of generally feeling ill.  Apparently she had some bloody stool about 2 weeks ago when she was visiting [place] and visited urgent care and was prescribed Anusol which has helped her.  This time reporting of abdominal pain and discomfort as well.  Chest x-ray unremarkable.  Hemoccult positive, started on PPI drip, and holding Eliquis.   Assessment & Plan:  Principal Problem:   GI bleed Active Problems:   Hypothyroidism   Hyperlipidemia   Essential hypertension   Chronic combined systolic and diastolic CHF (congestive heart failure) (HCC)   Diabetes (HCC)   Complete heart block (HCC)   Paroxysmal atrial fibrillation (HCC)   Secondary hypercoagulable state (HCC)   CKD (chronic kidney disease) stage 3, GFR 30-59 ml/min (HCC)   Diabetic neuropathy (HCC)   Acute cystitis    Generalized weakness, possible lower GI bleed Acute blood loss anemia - Unclear etiology.  Hemoglobin stable around 9.6 (baseline Hb 12).  Currently Eliquis is on hold.  Discontinue PPI drip, PPI IV twice daily is sufficient for now.  Closely monitor hemoglobin levels.  LB GI following, C-scope once breathing is improved  Abnormal BS -Combination of some volume overload versus rhonchi.  Stop IV fluids, IV Lasix ordered.  Bronchodilators scheduled and as needed, I-S/flutter valve  Acute on chronic combined systolic and diastolic CH, EF 40 to 45%  CHB with pacemaker in place.  Severe MR/TR -Continue Cozaar, bisoprolol.  Will give the Lasix [facility] cardiology.   Constipation - Bowel regimen    GERD with prior  history of hiatal hernia and severe GERD -Currently on PPI   Possible acute cystitis Follow culture data.  On empiric IV Rocephin      CKD (chronic kidney disease) stage 3, GFR 30-59 ml/min Creatinine around baseline of 1.4   Essential hypertension -Stable, Cozaar and Zebeta resumed.  IV as needed   Hypothyroidism Continue Synthroid   Paroxysmal atrial fibrillation (HCC) -Currently Eliquis is on hold.  On bisoprolol.  IV as needed     DVT prophylaxis: SCDs Start: 09/02/22 2311 Code Status: Full Family Communication:  Daughter updated.  On going eval for blood loss amenia.        Diet Orders (From admission, onward)     Start     Ordered   09/02/22 2317  Diet clear liquid Room service appropriate? Yes; Fluid consistency: Thin  Diet effective now       Question Answer Comment  Room service appropriate? Yes   Fluid consistency: Thin      09/02/22 2317            Subjective:  Overall feels ill. Reports of some resp distress.   Examination:  General exam: Appears calm and comfortable  Respiratory system: Bibasilar rhonchi Cardiovascular system: S1 & S2 heard, RRR. No JVD, murmurs, rubs, gallops or clicks. No pedal edema. Gastrointestinal system: Abdomen is nondistended, soft and nontender. No organomegaly or masses felt. Normal bowel sounds heard. Central nervous system: Alert and oriented. No focal neurological deficits. Extremities: Symmetric 5 x 5 power. Skin: No rashes, lesions or ulcers Psychiatry: Judgement and insight appear normal. Mood & affect appropriate.  Objective: Vitals:   09/02/22 2230 09/02/22 2300 09/03/22 0000 09/03/22 0345  BP: 138/74 (!) 144/69 (!) 131/106 (!) 127/57  Pulse: 65 65 63 64  Resp: 19 13 13 20   Temp:   97.6 F (36.4 C) 97.6 F (36.4 C)  TempSrc:    Oral  SpO2: 96% 99%  97%  Weight:   69.2 kg   Height:   5\' 2"  (1.575 m)    No intake or output data in the 24 hours ending 09/03/22 0755 Filed Weights   09/03/22 0000   Weight: 69.2 kg    Scheduled Meds:  bisoprolol  2.5 mg Oral Daily   levothyroxine  112 mcg Oral QAC breakfast   losartan  25 mg Oral Daily   metolazone  2.5 mg Oral Once per day on Mon Wed Sat   [START ON 09/06/2022] pantoprazole  40 mg Intravenous Q12H   Continuous Infusions:  sodium chloride 125 mL/hr at 09/03/22 0024   cefTRIAXone (ROCEPHIN)  IV 1 g (09/03/22 0103)   pantoprazole 8 mg/hr (09/03/22 0126)    Nutritional status     Body mass index is 27.89 kg/m.  Data Reviewed:   CBC: Recent Labs  Lab 09/02/22 2001 09/02/22 2328  WBC 7.3  --   HGB 10.2* 9.6*  HCT 31.6* 30.1*  MCV 102.6*  --   PLT 179  --    Basic Metabolic Panel: Recent Labs  Lab 09/02/22 2001 09/03/22 0101  NA 134* 135  K 3.9 3.6  CL 96* 97*  CO2 25 25  GLUCOSE 124* 133*  BUN 45* 42*  CREATININE 1.59* 1.47*  CALCIUM 9.5 9.0  MG  --  2.2   GFR: Estimated Creatinine Clearance: 20.9 mL/min (A) (by C-G formula based on SCr of 1.47 mg/dL (H)). Liver Function Tests: Recent Labs  Lab 09/02/22 2139  AST 31  ALT 11  ALKPHOS 54  BILITOT 1.2  PROT 5.5*  ALBUMIN 3.1*   No results for input(s): "LIPASE", "AMYLASE" in the last 168 hours. No results for input(s): "AMMONIA" in the last 168 hours. Coagulation Profile: Recent Labs  Lab 09/02/22 2139  INR 1.5*   Cardiac Enzymes: No results for input(s): "CKTOTAL", "CKMB", "CKMBINDEX", "TROPONINI" in the last 168 hours. BNP (last 3 results) No results for input(s): "PROBNP" in the last 8760 hours. HbA1C: No results for input(s): "HGBA1C" in the last 72 hours. CBG: Recent Labs  Lab 09/02/22 2040  GLUCAP 131*   Lipid Profile: No results for input(s): "CHOL", "HDL", "LDLCALC", "TRIG", "CHOLHDL", "LDLDIRECT" in the last 72 hours. Thyroid Function Tests: No results for input(s): "TSH", "T4TOTAL", "FREET4", "T3FREE", "THYROIDAB" in the last 72 hours. Anemia Panel: No results for input(s): "VITAMINB12", "FOLATE", "FERRITIN", "TIBC",  "IRON", "RETICCTPCT" in the last 72 hours. Sepsis Labs: No results for input(s): "PROCALCITON", "LATICACIDVEN" in the last 168 hours.  No results found for this or any previous visit (from the past 240 hour(s)).       Radiology Studies: DG Chest 2 View  Result Date: 09/02/2022 CLINICAL DATA:  Shortness of breath, fatigue EXAM: CHEST - 2 VIEW COMPARISON:  06/02/2020 FINDINGS: Calcified granuloma in the left mid lung. Lungs are otherwise clear. No pleural effusion or pneumothorax. Cardiomegaly. Left subclavian pacemaker. Thoracic aortic atherosclerosis. Mild degenerative changes of the left shoulder. Thoracic spine is within normal limits. IMPRESSION: Normal chest radiographs. Electronically Signed   By: Charline Bills M.D.   On: 09/02/2022 20:35           LOS: 0 days  Time spent= 35 mins    Debrina Kizer Joline Maxcy, MD [facility]  If 7PM-7AM, please contact night-coverage  09/03/2022, 7:55 AM

## 2022-09-04 ENCOUNTER — Inpatient Hospital Stay (HOSPITAL_COMMUNITY): Payer: PPO

## 2022-09-04 DIAGNOSIS — K625 Hemorrhage of anus and rectum: Secondary | ICD-10-CM

## 2022-09-04 DIAGNOSIS — R195 Other fecal abnormalities: Secondary | ICD-10-CM

## 2022-09-04 DIAGNOSIS — I428 Other cardiomyopathies: Secondary | ICD-10-CM

## 2022-09-04 DIAGNOSIS — K449 Diaphragmatic hernia without obstruction or gangrene: Secondary | ICD-10-CM | POA: Diagnosis not present

## 2022-09-04 DIAGNOSIS — K922 Gastrointestinal hemorrhage, unspecified: Secondary | ICD-10-CM | POA: Diagnosis not present

## 2022-09-04 DIAGNOSIS — D649 Anemia, unspecified: Secondary | ICD-10-CM | POA: Diagnosis not present

## 2022-09-04 DIAGNOSIS — Z7901 Long term (current) use of anticoagulants: Secondary | ICD-10-CM | POA: Diagnosis not present

## 2022-09-04 DIAGNOSIS — D62 Acute posthemorrhagic anemia: Secondary | ICD-10-CM

## 2022-09-04 DIAGNOSIS — Z515 Encounter for palliative care: Secondary | ICD-10-CM

## 2022-09-04 HISTORY — DX: Acute posthemorrhagic anemia: D62

## 2022-09-04 HISTORY — DX: Other fecal abnormalities: R19.5

## 2022-09-04 HISTORY — DX: Hemorrhage of anus and rectum: K62.5

## 2022-09-04 LAB — BASIC METABOLIC PANEL
Anion gap: 14 (ref 5–15)
BUN: 39 mg/dL — ABNORMAL HIGH (ref 8–23)
CO2: 24 mmol/L (ref 22–32)
Calcium: 8.8 mg/dL — ABNORMAL LOW (ref 8.9–10.3)
Chloride: 94 mmol/L — ABNORMAL LOW (ref 98–111)
Creatinine, Ser: 1.64 mg/dL — ABNORMAL HIGH (ref 0.44–1.00)
GFR, Estimated: 29 mL/min — ABNORMAL LOW (ref >60)
Glucose, Bld: 149 mg/dL — ABNORMAL HIGH (ref 70–99)
Potassium: 3.5 mmol/L (ref 3.5–5.1)
Sodium: 132 mmol/L — ABNORMAL LOW (ref 135–145)

## 2022-09-04 LAB — GLUCOSE, CAPILLARY
Glucose-Capillary: 116 mg/dL — ABNORMAL HIGH (ref 70–99)
Glucose-Capillary: 118 mg/dL — ABNORMAL HIGH (ref 70–99)
Glucose-Capillary: 127 mg/dL — ABNORMAL HIGH (ref 70–99)
Glucose-Capillary: 92 mg/dL (ref 70–99)

## 2022-09-04 LAB — HEMOGLOBIN AND HEMATOCRIT, BLOOD
HCT: 30.6 % — ABNORMAL LOW (ref 36.0–46.0)
Hemoglobin: 9.8 g/dL — ABNORMAL LOW (ref 12.0–15.0)

## 2022-09-04 LAB — CBC
HCT: 27.5 % — ABNORMAL LOW (ref 36.0–46.0)
Hemoglobin: 8.8 g/dL — ABNORMAL LOW (ref 12.0–15.0)
MCH: 33.7 pg (ref 26.0–34.0)
MCHC: 32 g/dL (ref 30.0–36.0)
MCV: 105.4 fL — ABNORMAL HIGH (ref 80.0–100.0)
Platelets: 148 10*3/uL — ABNORMAL LOW (ref 150–400)
RBC: 2.61 MIL/uL — ABNORMAL LOW (ref 3.87–5.11)
RDW: 16.1 % — ABNORMAL HIGH (ref 11.5–15.5)
WBC: 6.9 10*3/uL (ref 4.0–10.5)
nRBC: 0 % (ref 0.0–0.2)

## 2022-09-04 LAB — ECHOCARDIOGRAM COMPLETE
Calc EF: 55.7 %
Height: 62 in
MV M vel: 4.94 m/s
MV Peak grad: 97.6 mmHg
Radius: 0.4 cm
S' Lateral: 3.1 cm
Single Plane A2C EF: 55.6 %
Single Plane A4C EF: 55.6 %
Weight: 2401.6 oz

## 2022-09-04 LAB — URINE CULTURE

## 2022-09-04 LAB — FOLATE: Folate: 10.5 ng/mL (ref >5.9)

## 2022-09-04 LAB — CULTURE, BLOOD (ROUTINE X 2): Culture: NO GROWTH

## 2022-09-04 LAB — BRAIN NATRIURETIC PEPTIDE: B Natriuretic Peptide: 297 pg/mL — ABNORMAL HIGH (ref 0.0–100.0)

## 2022-09-04 LAB — MAGNESIUM: Magnesium: 2 mg/dL (ref 1.7–2.4)

## 2022-09-04 LAB — PHOSPHORUS: Phosphorus: 2.9 mg/dL (ref 2.5–4.6)

## 2022-09-04 MED ORDER — PEG-KCL-NACL-NASULF-NA ASC-C 100 G PO SOLR
0.5000 | Freq: Once | ORAL | Status: AC
  Administered 2022-09-04: 100 g via ORAL

## 2022-09-04 MED ORDER — SODIUM CHLORIDE 0.9 % IV SOLN: INTRAVENOUS | Status: DC

## 2022-09-04 MED ORDER — PEG-KCL-NACL-NASULF-NA ASC-C 100 G PO SOLR
0.5000 | Freq: Once | ORAL | Status: AC
  Administered 2022-09-04: 100 g via ORAL
  Filled 2022-09-04: qty 1

## 2022-09-04 MED ORDER — BISACODYL 5 MG PO TBEC
10.0000 mg | DELAYED_RELEASE_TABLET | Freq: Once | ORAL | Status: AC
  Administered 2022-09-04: 10 mg via ORAL
  Filled 2022-09-04: qty 2

## 2022-09-04 MED ORDER — PEG-KCL-NACL-NASULF-NA ASC-C 100 G PO SOLR: 1.0000 | Freq: Once | ORAL | Status: DC

## 2022-09-04 MED ORDER — IPRATROPIUM-ALBUTEROL 0.5-2.5 (3) MG/3ML IN SOLN
3.0000 mL | Freq: Two times a day (BID) | RESPIRATORY_TRACT | Status: DC
  Administered 2022-09-04 – 2022-09-07 (×7): 3 mL via RESPIRATORY_TRACT
  Filled 2022-09-04 (×7): qty 3

## 2022-09-04 MED ORDER — BISACODYL 10 MG RE SUPP
10.0000 mg | Freq: Once | RECTAL | Status: AC
  Administered 2022-09-04: 10 mg via RECTAL
  Filled 2022-09-04: qty 1

## 2022-09-04 NOTE — Progress Notes (Signed)
PROGRESS NOTE    Beverly Kim  ZOX:[id number] DOB: March 09, 1924 DOA: 09/02/2022 PCP: Pincus Sanes, MD   Brief Narrative:  89 year old with history of HTN, HLD, hypothyroidism, GERD, systolic CHF EF 45%, paroxysmal A-fib on Eliquis, nonocclusive CAD, complete heart block status post pacemaker, DM2, CKD stage III, RLS, insomnia, chronic orthopnea and nausea comes to the hospital with complaints of generally feeling ill.  Apparently she had some bloody stool about 2 weeks ago when she was visiting [place] and visited urgent care and was prescribed Anusol which has helped her.  This time reporting of abdominal pain and discomfort as well.  Chest x-ray unremarkable.  Hemoccult positive, started on PPI drip, and holding Eliquis.   Assessment & Plan:  Principal Problem:   GI bleed Active Problems:   Hypothyroidism   Hyperlipidemia   Essential hypertension   Chronic combined systolic and diastolic CHF (congestive heart failure) (HCC)   Diabetes (HCC)   Complete heart block (HCC)   Paroxysmal atrial fibrillation (HCC)   Secondary hypercoagulable state (HCC)   CKD (chronic kidney disease) stage 3, GFR 30-59 ml/min (HCC)   Diabetic neuropathy (HCC)   Acute cystitis    Generalized weakness, possible lower GI bleed Acute blood loss anemia - Unclear etiology.  Hemoglobin stable around 9.6 (baseline Hb 12).  Hemoglobin 8.8, Eliquis on hold.  PPI IV twice daily.  GI planning on colonoscopy/sigmoidoscopy once breathing symptoms have improved  Abnormal BS - Bronchodilators scheduled and as needed.  I-S/flutter valve.  Respiratory staff advised not to discontinue scheduled bronchodilators without discussing with daytime rounding MD. -Repeat chest x-ray  Acute on chronic combined systolic and diastolic CH, EF 40 to 45%  CHB with pacemaker in place.  Severe MR/TR -Continue Cozaar, bisoprolol.  Repeat [facility] cardiology.   Constipation - Bowel regimen    GERD with prior history  of hiatal hernia and severe GERD -Currently on PPI   Possible acute cystitis Follow culture data.  On empiric IV Rocephin      CKD (chronic kidney disease) stage 3, GFR 30-59 ml/min Creatinine around baseline of 1.4   Essential hypertension -Stable, Cozaar and Zebeta resumed.  IV as needed   Hypothyroidism Continue Synthroid   Paroxysmal atrial fibrillation (HCC) -Currently Eliquis is on hold.  On bisoprolol.  IV as needed     DVT prophylaxis: SCDs Start: 09/02/22 2311 Code Status: Full Family Communication:  Daughter called On going eval for blood loss amenia.        Diet Orders (From admission, onward)     Start     Ordered   09/05/22 0001  Diet NPO time specified  Diet effective midnight        09/03/22 2214   09/04/22 0500  Diet clear liquid Room service appropriate? Yes; Fluid consistency: Thin  Diet effective 0500 tomorrow       Question Answer Comment  Room service appropriate? Yes   Fluid consistency: Thin      09/03/22 1701            Subjective:  Still feeling sob.  Poor efforts on IS  Examination:  Constitutional: Not in acute distress Respiratory: Diminished breath sounds bilaterally Cardiovascular: Normal sinus rhythm, no rubs Abdomen: Nontender nondistended good bowel sounds Musculoskeletal: No edema noted Skin: No rashes seen Neurologic: CN 2-12 grossly intact.  And nonfocal Psychiatric: Normal judgment and insight. Alert and oriented x 3. Normal mood.    Objective: Vitals:   09/03/22 2005 09/03/22 2320 09/04/22 0406 09/04/22  0718  BP:  135/62 109/67 (!) 138/55  Pulse:  63 65 64  Resp: 20 20 20 18   Temp:  (!) 97 F (36.1 C) 97.9 F (36.6 C) (!) 97.5 F (36.4 C)  TempSrc:  Axillary Oral Oral  SpO2:  100% 98% 98%  Weight:   68.1 kg   Height:        Intake/Output Summary (Last 24 hours) at 09/04/2022 0733 Last data filed at 09/04/2022 0417 Gross per 24 hour  Intake 2422.22 ml  Output 1100 ml  Net 1322.22 ml   Filed  Weights   09/03/22 0000 09/04/22 0406  Weight: 69.2 kg 68.1 kg    Scheduled Meds:  bisoprolol  2.5 mg Oral Daily   levothyroxine  112 mcg Oral QAC breakfast   losartan  25 mg Oral Daily   metolazone  2.5 mg Oral Once per day on Mon Wed Sat   [START ON 09/06/2022] pantoprazole  40 mg Intravenous Q12H   polyethylene glycol  17 g Oral BID   Continuous Infusions:  sodium chloride 125 mL/hr at 09/03/22 0925   cefTRIAXone (ROCEPHIN)  IV Stopped (09/04/22 0100)    Nutritional status     Body mass index is 27.45 kg/m.  Data Reviewed:   CBC: Recent Labs  Lab 09/02/22 2001 09/02/22 2328 09/03/22 1139 09/03/22 2241 09/04/22 0028  WBC 7.3  --   --   --  6.9  HGB 10.2* 9.6* 9.2* 8.9* 8.8*  HCT 31.6* 30.1* 28.7* 27.5* 27.5*  MCV 102.6*  --   --   --  105.4*  PLT 179  --   --   --  148*   Basic Metabolic Panel: Recent Labs  Lab 09/02/22 2001 09/03/22 0101 09/04/22 0028  NA 134* 135 132*  K 3.9 3.6 3.5  CL 96* 97* 94*  CO2 25 25 24   GLUCOSE 124* 133* 149*  BUN 45* 42* 39*  CREATININE 1.59* 1.47* 1.64*  CALCIUM 9.5 9.0 8.8*  MG  --  2.2 2.0  PHOS  --   --  2.9   GFR: Estimated Creatinine Clearance: 18.6 mL/min (A) (by C-G formula based on SCr of 1.64 mg/dL (H)). Liver Function Tests: Recent Labs  Lab 09/02/22 2139  AST 31  ALT 11  ALKPHOS 54  BILITOT 1.2  PROT 5.5*  ALBUMIN 3.1*   No results for input(s): "LIPASE", "AMYLASE" in the last 168 hours. No results for input(s): "AMMONIA" in the last 168 hours. Coagulation Profile: Recent Labs  Lab 09/02/22 2139  INR 1.5*   Cardiac Enzymes: No results for input(s): "CKTOTAL", "CKMB", "CKMBINDEX", "TROPONINI" in the last 168 hours. BNP (last 3 results) No results for input(s): "PROBNP" in the last 8760 hours. HbA1C: No results for input(s): "HGBA1C" in the last 72 hours. CBG: Recent Labs  Lab 09/02/22 2040 09/03/22 1216 09/03/22 1616 09/03/22 2111 09/04/22 0602  GLUCAP 131* 106* 144* 210* 118*    Lipid Profile: No results for input(s): "CHOL", "HDL", "LDLCALC", "TRIG", "CHOLHDL", "LDLDIRECT" in the last 72 hours. Thyroid Function Tests: Recent Labs    09/03/22 1139  TSH 5.034*   Anemia Panel: Recent Labs    09/03/22 1139 09/04/22 0028  VITAMINB12 871  --   FOLATE  --  10.5  FERRITIN 77  --   TIBC 395  --   IRON 80  --    Sepsis Labs: No results for input(s): "PROCALCITON", "LATICACIDVEN" in the last 168 hours.  Recent Results (from the past 240 hour(s))  Culture,  blood (Routine X 2) w Reflex to ID Panel     Status: None (Preliminary result)   Collection Time: 09/02/22 10:24 PM   Specimen: BLOOD  Result Value Ref Range Status   Specimen Description BLOOD BLOOD RIGHT WRIST  Final   Special Requests   Final    BOTTLES DRAWN AEROBIC AND ANAEROBIC Blood Culture results may not be optimal due to an inadequate volume of blood received in culture bottles   Culture   Final    NO GROWTH < 24 HOURS Performed at [facility], [address], [place], [place] [address]    Report Status PENDING  Incomplete         Radiology Studies: DG Chest 2 View  Result Date: 09/02/2022 CLINICAL DATA:  Shortness of breath, fatigue EXAM: CHEST - 2 VIEW COMPARISON:  06/02/2020 FINDINGS: Calcified granuloma in the left mid lung. Lungs are otherwise clear. No pleural effusion or pneumothorax. Cardiomegaly. Left subclavian pacemaker. Thoracic aortic atherosclerosis. Mild degenerative changes of the left shoulder. Thoracic spine is within normal limits. IMPRESSION: Normal chest radiographs. Electronically Signed   By: Charline Bills M.D.   On: 09/02/2022 20:35           LOS: 1 day   Time spent= 35 mins    Daiquan Resnik Joline Maxcy, MD [facility]  If 7PM-7AM, please contact night-coverage  09/04/2022, 7:33 AM

## 2022-09-04 NOTE — Progress Notes (Signed)
Daily Progress Note  DOA: 09/02/2022 Hospital Day: 3 Chief Complaint: H+ stool, intermittent rectal bleleding   Assessment and Plan:    Brief Narrative:  Beverly Kim is a 89 y.o. year old female whose past medical history includes, but isn't necessarily limited to, GERD with HH, chronic combined heart failure, CKD 3, hypothyroidism, chronic A-Fib on Eliquis, hx of choledocholithiasis s/p CCY, colon polyps, severe sigmoid diverticulosis. GI saw in consult 7/1 for H+ stool.   89 yo female with intermittent painless rectal bleeding ( in setting of constipation) and anemia. Bleeding probably hemorrhoidal.  Other etiologies such as polyp / neoplasm also possible.  Not iron deficient and normal B12 / folate levels. Last colonoscopy in 2011 without polyps or cancer. Had a non-adenomatous "polyp". -Hgb stable at 8.8 but down from her baseline 12-13 gms -She is open to a colonoscopy but doesn't feel strong enough right now and still feels like she cannot breathe though breaths are not labored. Lungs clear. No edema.  CXR negative.   Chronic constipation Started Miralax yesterday. Two BMs documented since but she says she hasn't had any BMs since admission? She feels constipated.  -Two dulcolax tablets, dulcolax supp and Moviprep today to see if bowel purge will make her feel better.   GERD with small hiatal hernia.  Maintained on daily Pantoprazole   A-fib, Eliquis on hold.    Chronic combined heart failure    CKD 3.  Subjective / New Events:   Doesn't feel well. Feels like she cannot breath. Feels constipated with rectal  pressure.    Objective:   Recent Labs    09/02/22 2001 09/02/22 2328 09/03/22 1139 09/03/22 2241 09/04/22 0028  WBC 7.3  --   --   --  6.9  HGB 10.2*   < > 9.2* 8.9* 8.8*  HCT 31.6*   < > 28.7* 27.5* 27.5*  PLT 179  --   --   --  148*   < > = values in this interval not displayed.   BMET Recent Labs    09/02/22 2001 09/03/22 0101  09/04/22 0028  NA 134* 135 132*  K 3.9 3.6 3.5  CL 96* 97* 94*  CO2 25 25 24   GLUCOSE 124* 133* 149*  BUN 45* 42* 39*  CREATININE 1.59* 1.47* 1.64*  CALCIUM 9.5 9.0 8.8*   LFT Recent Labs    09/02/22 2139  PROT 5.5*  ALBUMIN 3.1*  AST 31  ALT 11  ALKPHOS 54  BILITOT 1.2  BILIDIR 0.4*  IBILI 0.8   PT/INR Recent Labs    09/02/22 2139  LABPROT 18.3*  INR 1.5*     Imaging:  DG Chest 2 View CLINICAL DATA:  Shortness of breath, fatigue  EXAM: CHEST - 2 VIEW  COMPARISON:  06/02/2020  FINDINGS: Calcified granuloma in the left mid lung. Lungs are otherwise clear. No pleural effusion or pneumothorax.  Cardiomegaly. Left subclavian pacemaker. Thoracic aortic atherosclerosis.  Mild degenerative changes of the left shoulder. Thoracic spine is within normal limits.  IMPRESSION: Normal chest radiographs.  Electronically Signed   By: Charline Bills M.D.   On: 09/02/2022 20:35     Scheduled inpatient medications:   bisoprolol  2.5 mg Oral Daily   ipratropium-albuterol  3 mL Nebulization BID   levothyroxine  112 mcg Oral QAC breakfast   losartan  25 mg Oral Daily   metolazone  2.5 mg Oral Once per day on Mon Wed Sat   [START ON 09/06/2022]  pantoprazole  40 mg Intravenous Q12H   polyethylene glycol  17 g Oral BID   Continuous inpatient infusions:   cefTRIAXone (ROCEPHIN)  IV Stopped (09/04/22 0100)   PRN inpatient medications: acetaminophen **OR** acetaminophen, guaiFENesin, hydrALAZINE, ipratropium-albuterol, metoprolol tartrate, ondansetron (ZOFRAN) IV, mouth rinse, senna-docusate, traZODone  Vital signs in last 24 hours: Temp:  [97 F (36.1 C)-97.9 F (36.6 C)] 97.5 F (36.4 C) (07/02 0718) Pulse Rate:  [63-67] 64 (07/02 0718) Resp:  [18-21] 18 (07/02 0718) BP: (109-145)/(23-67) 138/55 (07/02 0718) SpO2:  [98 %-100 %] 98 % (07/02 0819) Weight:  [68.1 kg] 68.1 kg (07/02 0406) Last BM Date : 09/03/22  Intake/Output Summary (Last 24 hours) at  09/04/2022 1111 Last data filed at 09/04/2022 0900 Gross per 24 hour  Intake 2522.22 ml  Output 1100 ml  Net 1422.22 ml    Intake/Output from previous day: 07/01 0701 - 07/02 0700 In: 2422.2 [P.O.:960; I.V.:1255.1; IV Piggyback:207.1] Out: 1100 [Urine:1100] Intake/Output this shift: Total I/O In: 360 [P.O.:360] Out: -    Physical Exam:  General: Alert female in NAD Heart:  Regular rate   Pulmonary: Normal respiratory effort Abdomen: Soft, nondistended, nontender. Normal bowel sounds. Extremities: No lower extremity edema  Neurologic: Alert and oriented Psych: Pleasant. Cooperative. Insight appears normal.    Principal Problem:   GI bleed Active Problems:   Hypothyroidism   Hyperlipidemia   Essential hypertension   Chronic combined systolic and diastolic CHF (congestive heart failure) (HCC)   Diabetes (HCC)   Complete heart block (HCC)   Paroxysmal atrial fibrillation (HCC)   Secondary hypercoagulable state (HCC)   CKD (chronic kidney disease) stage 3, GFR 30-59 ml/min (HCC)   Diabetic neuropathy (HCC)   Acute cystitis     LOS: 1 day   Willette Cluster ,NP 09/04/2022, 11:11 AM

## 2022-09-04 NOTE — Progress Notes (Signed)
Mobility Specialist Progress Note:    09/04/22 1511  Mobility  Activity Ambulated with assistance in room  Level of Assistance Minimal assist, patient does 75% or more  Assistive Device Front wheel walker  Distance Ambulated (ft) 30 ft  Activity Response Tolerated well  Mobility Referral Yes  $Mobility charge 1 Mobility  Mobility Specialist Start Time (ACUTE ONLY) 1336  Mobility Specialist Stop Time (ACUTE ONLY) 1354  Mobility Specialist Time Calculation (min) (ACUTE ONLY) 18 min   Pt received sitting on BSC, agreeable to ambulate. Pt c/o pain in her stomach, no rating given, distance limited d/t said pain. Pt assisted back to chair w/ call bell and personal items at hand. All needs met.  [name] Mobility Specialist  Please contact vis Secure Chat or  Rehab Office [phone]

## 2022-09-04 NOTE — H&P (View-Only) (Signed)
   Daily Progress Note  DOA: 09/02/2022 Hospital Day: 3 Chief Complaint: H+ stool, intermittent rectal bleleding   Assessment and Plan:    Brief Narrative:  Beverly Kim is a 89 y.o. year old female whose past medical history includes, but isn't necessarily limited to, GERD with HH, chronic combined heart failure, CKD 3, hypothyroidism, chronic A-Fib on Eliquis, hx of choledocholithiasis s/p CCY, colon polyps, severe sigmoid diverticulosis. GI saw in consult 7/1 for H+ stool.   89 yo female with intermittent painless rectal bleeding ( in setting of constipation) and anemia. Bleeding probably hemorrhoidal.  Other etiologies such as polyp / neoplasm also possible.  Not iron deficient and normal B12 / folate levels. Last colonoscopy in 2011 without polyps or cancer. Had a non-adenomatous "polyp". -Hgb stable at 8.8 but down from her baseline 12-13 gms -She is open to a colonoscopy but doesn't feel strong enough right now and still feels like she cannot breathe though breaths are not labored. Lungs clear. No edema.  CXR negative.   Chronic constipation Started Miralax yesterday. Two BMs documented since but she says she hasn't had any BMs since admission? She feels constipated.  -Two dulcolax tablets, dulcolax supp and Moviprep today to see if bowel purge will make her feel better.   GERD with small hiatal hernia.  Maintained on daily Pantoprazole   A-fib, Eliquis on hold.    Chronic combined heart failure    CKD 3.  Subjective / New Events:   Doesn't feel well. Feels like she cannot breath. Feels constipated with rectal  pressure.    Objective:   Recent Labs    09/02/22 2001 09/02/22 2328 09/03/22 1139 09/03/22 2241 09/04/22 0028  WBC 7.3  --   --   --  6.9  HGB 10.2*   < > 9.2* 8.9* 8.8*  HCT 31.6*   < > 28.7* 27.5* 27.5*  PLT 179  --   --   --  148*   < > = values in this interval not displayed.   BMET Recent Labs    09/02/22 2001 09/03/22 0101  09/04/22 0028  NA 134* 135 132*  K 3.9 3.6 3.5  CL 96* 97* 94*  CO2 25 25 24  GLUCOSE 124* 133* 149*  BUN 45* 42* 39*  CREATININE 1.59* 1.47* 1.64*  CALCIUM 9.5 9.0 8.8*   LFT Recent Labs    09/02/22 2139  PROT 5.5*  ALBUMIN 3.1*  AST 31  ALT 11  ALKPHOS 54  BILITOT 1.2  BILIDIR 0.4*  IBILI 0.8   PT/INR Recent Labs    09/02/22 2139  LABPROT 18.3*  INR 1.5*     Imaging:  DG Chest 2 View CLINICAL DATA:  Shortness of breath, fatigue  EXAM: CHEST - 2 VIEW  COMPARISON:  06/02/2020  FINDINGS: Calcified granuloma in the left mid lung. Lungs are otherwise clear. No pleural effusion or pneumothorax.  Cardiomegaly. Left subclavian pacemaker. Thoracic aortic atherosclerosis.  Mild degenerative changes of the left shoulder. Thoracic spine is within normal limits.  IMPRESSION: Normal chest radiographs.  Electronically Signed   By: Sriyesh  Krishnan M.D.   On: 09/02/2022 20:35     Scheduled inpatient medications:   bisoprolol  2.5 mg Oral Daily   ipratropium-albuterol  3 mL Nebulization BID   levothyroxine  112 mcg Oral QAC breakfast   losartan  25 mg Oral Daily   metolazone  2.5 mg Oral Once per day on Mon Wed Sat   [START ON 09/06/2022]   pantoprazole  40 mg Intravenous Q12H   polyethylene glycol  17 g Oral BID   Continuous inpatient infusions:   cefTRIAXone (ROCEPHIN)  IV Stopped (09/04/22 0100)   PRN inpatient medications: acetaminophen **OR** acetaminophen, guaiFENesin, hydrALAZINE, ipratropium-albuterol, metoprolol tartrate, ondansetron (ZOFRAN) IV, mouth rinse, senna-docusate, traZODone  Vital signs in last 24 hours: Temp:  [97 F (36.1 C)-97.9 F (36.6 C)] 97.5 F (36.4 C) (07/02 0718) Pulse Rate:  [63-67] 64 (07/02 0718) Resp:  [18-21] 18 (07/02 0718) BP: (109-145)/(23-67) 138/55 (07/02 0718) SpO2:  [98 %-100 %] 98 % (07/02 0819) Weight:  [68.1 kg] 68.1 kg (07/02 0406) Last BM Date : 09/03/22  Intake/Output Summary (Last 24 hours) at  09/04/2022 1111 Last data filed at 09/04/2022 0900 Gross per 24 hour  Intake 2522.22 ml  Output 1100 ml  Net 1422.22 ml    Intake/Output from previous day: 07/01 0701 - 07/02 0700 In: 2422.2 [P.O.:960; I.V.:1255.1; IV Piggyback:207.1] Out: 1100 [Urine:1100] Intake/Output this shift: Total I/O In: 360 [P.O.:360] Out: -    Physical Exam:  General: Alert female in NAD Heart:  Regular rate   Pulmonary: Normal respiratory effort Abdomen: Soft, nondistended, nontender. Normal bowel sounds. Extremities: No lower extremity edema  Neurologic: Alert and oriented Psych: Pleasant. Cooperative. Insight appears normal.    Principal Problem:   GI bleed Active Problems:   Hypothyroidism   Hyperlipidemia   Essential hypertension   Chronic combined systolic and diastolic CHF (congestive heart failure) (HCC)   Diabetes (HCC)   Complete heart block (HCC)   Paroxysmal atrial fibrillation (HCC)   Secondary hypercoagulable state (HCC)   CKD (chronic kidney disease) stage 3, GFR 30-59 ml/min (HCC)   Diabetic neuropathy (HCC)   Acute cystitis     LOS: 1 day   Omir Cooprider ,NP 09/04/2022, 11:11 AM       

## 2022-09-04 NOTE — Progress Notes (Signed)
     Referral received for Beverly Kim re: goals of care discussion. Chart reviewed and updates received from PCT. I attempted to assess patient at two different times today. Pt was unavailable during both attempts - patient care/toileting.   I attempted to contact patient's daughter Beverly Kim. There was no answer and I left a HIPPA compliant voicemail with PMT contact information.   PMT will re-attempt to contact family at a later time/date. Detailed note and recommendations to follow once GOC has been completed.   Thank you for your referral and allowing PMT to assist in [name]'s care.   Georgiann Cocker, FNP-BC Palliative Medicine Team  Phone: [phone]  NO CHARGE

## 2022-09-05 ENCOUNTER — Encounter (HOSPITAL_COMMUNITY): Payer: Self-pay | Admitting: Internal Medicine

## 2022-09-05 ENCOUNTER — Inpatient Hospital Stay (HOSPITAL_COMMUNITY): Payer: PPO

## 2022-09-05 ENCOUNTER — Encounter (HOSPITAL_COMMUNITY)
Admission: EM | Disposition: A | Discharge status: Home Health | Payer: Self-pay | Source: Home / Self Care | Attending: Internal Medicine

## 2022-09-05 DIAGNOSIS — K5730 Diverticulosis of large intestine without perforation or abscess without bleeding: Secondary | ICD-10-CM

## 2022-09-05 DIAGNOSIS — K921 Melena: Secondary | ICD-10-CM | POA: Diagnosis not present

## 2022-09-05 DIAGNOSIS — K641 Second degree hemorrhoids: Secondary | ICD-10-CM

## 2022-09-05 DIAGNOSIS — I2510 Atherosclerotic heart disease of native coronary artery without angina pectoris: Secondary | ICD-10-CM

## 2022-09-05 DIAGNOSIS — D120 Benign neoplasm of cecum: Secondary | ICD-10-CM | POA: Diagnosis not present

## 2022-09-05 DIAGNOSIS — N183 Chronic kidney disease, stage 3 unspecified: Secondary | ICD-10-CM

## 2022-09-05 DIAGNOSIS — D123 Benign neoplasm of transverse colon: Secondary | ICD-10-CM

## 2022-09-05 DIAGNOSIS — K5520 Angiodysplasia of colon without hemorrhage: Secondary | ICD-10-CM | POA: Diagnosis not present

## 2022-09-05 DIAGNOSIS — I5042 Chronic combined systolic (congestive) and diastolic (congestive) heart failure: Secondary | ICD-10-CM

## 2022-09-05 DIAGNOSIS — D62 Acute posthemorrhagic anemia: Secondary | ICD-10-CM | POA: Diagnosis not present

## 2022-09-05 DIAGNOSIS — D126 Benign neoplasm of colon, unspecified: Secondary | ICD-10-CM

## 2022-09-05 DIAGNOSIS — I130 Hypertensive heart and chronic kidney disease with heart failure and stage 1 through stage 4 chronic kidney disease, or unspecified chronic kidney disease: Secondary | ICD-10-CM

## 2022-09-05 HISTORY — PX: BIOPSY: SHX5522

## 2022-09-05 HISTORY — PX: POLYPECTOMY: SHX5525

## 2022-09-05 HISTORY — PX: HOT HEMOSTASIS: SHX5433

## 2022-09-05 HISTORY — PX: COLONOSCOPY: SHX5424

## 2022-09-05 LAB — BASIC METABOLIC PANEL
Anion gap: 14 (ref 5–15)
BUN: 30 mg/dL — ABNORMAL HIGH (ref 8–23)
CO2: 18 mmol/L — ABNORMAL LOW (ref 22–32)
Calcium: 9.1 mg/dL (ref 8.9–10.3)
Chloride: 101 mmol/L (ref 98–111)
Creatinine, Ser: 1.31 mg/dL — ABNORMAL HIGH (ref 0.44–1.00)
GFR, Estimated: 38 mL/min — ABNORMAL LOW (ref >60)
Glucose, Bld: 123 mg/dL — ABNORMAL HIGH (ref 70–99)
Potassium: 3.5 mmol/L (ref 3.5–5.1)
Sodium: 133 mmol/L — ABNORMAL LOW (ref 135–145)

## 2022-09-05 LAB — CBC
HCT: 29.9 % — ABNORMAL LOW (ref 36.0–46.0)
Hemoglobin: 9.5 g/dL — ABNORMAL LOW (ref 12.0–15.0)
MCH: 33.1 pg (ref 26.0–34.0)
MCHC: 31.8 g/dL (ref 30.0–36.0)
MCV: 104.2 fL — ABNORMAL HIGH (ref 80.0–100.0)
Platelets: 153 10*3/uL (ref 150–400)
RBC: 2.87 MIL/uL — ABNORMAL LOW (ref 3.87–5.11)
RDW: 15.9 % — ABNORMAL HIGH (ref 11.5–15.5)
WBC: 7.7 10*3/uL (ref 4.0–10.5)
nRBC: 0 % (ref 0.0–0.2)

## 2022-09-05 LAB — HEMOGLOBIN AND HEMATOCRIT, BLOOD
HCT: 27.1 % — ABNORMAL LOW (ref 36.0–46.0)
HCT: 30.6 % — ABNORMAL LOW (ref 36.0–46.0)
HCT: 30.8 % — ABNORMAL LOW (ref 36.0–46.0)
Hemoglobin: 8.8 g/dL — ABNORMAL LOW (ref 12.0–15.0)
Hemoglobin: 9.9 g/dL — ABNORMAL LOW (ref 12.0–15.0)
Hemoglobin: 9.9 g/dL — ABNORMAL LOW (ref 12.0–15.0)

## 2022-09-05 LAB — GLUCOSE, CAPILLARY
Glucose-Capillary: 105 mg/dL — ABNORMAL HIGH (ref 70–99)
Glucose-Capillary: 112 mg/dL — ABNORMAL HIGH (ref 70–99)
Glucose-Capillary: 139 mg/dL — ABNORMAL HIGH (ref 70–99)

## 2022-09-05 LAB — MAGNESIUM: Magnesium: 2.2 mg/dL (ref 1.7–2.4)

## 2022-09-05 LAB — CULTURE, BLOOD (ROUTINE X 2): Culture: NO GROWTH

## 2022-09-05 SURGERY — COLONOSCOPY
Anesthesia: Monitor Anesthesia Care

## 2022-09-05 MED ORDER — HYDROCORTISONE 1 % EX CREA
TOPICAL_CREAM | Freq: Two times a day (BID) | CUTANEOUS | Status: DC
  Administered 2022-09-07: 1 via TOPICAL
  Filled 2022-09-05: qty 28

## 2022-09-05 MED ORDER — SODIUM CHLORIDE 0.9 % IV SOLN: INTRAVENOUS | Status: DC | PRN

## 2022-09-05 MED ORDER — PROPOFOL 10 MG/ML IV BOLUS
INTRAVENOUS | Status: DC | PRN
  Administered 2022-09-05: 10 mg via INTRAVENOUS
  Administered 2022-09-05 (×2): 20 mg via INTRAVENOUS
  Administered 2022-09-05: 30 mg via INTRAVENOUS
  Administered 2022-09-05 (×3): 20 mg via INTRAVENOUS
  Administered 2022-09-05: 10 mg via INTRAVENOUS
  Administered 2022-09-05: 20 mg via INTRAVENOUS
  Administered 2022-09-05: 10 mg via INTRAVENOUS

## 2022-09-05 MED ORDER — PHENYLEPHRINE HCL (PRESSORS) 10 MG/ML IV SOLN
INTRAVENOUS | Status: DC | PRN
  Administered 2022-09-05: 120 ug via INTRAVENOUS
  Administered 2022-09-05: 80 ug via INTRAVENOUS
  Administered 2022-09-05: 200 ug via INTRAVENOUS
  Administered 2022-09-05: 80 ug via INTRAVENOUS

## 2022-09-05 MED ORDER — LIDOCAINE 2% (20 MG/ML) 5 ML SYRINGE
INTRAMUSCULAR | Status: DC | PRN
  Administered 2022-09-05: 40 mg via INTRAVENOUS

## 2022-09-05 MED ORDER — CALCIUM POLYCARBOPHIL 625 MG PO TABS
625.0000 mg | ORAL_TABLET | Freq: Every day | ORAL | Status: DC
  Administered 2022-09-05 – 2022-09-07 (×3): 625 mg via ORAL
  Filled 2022-09-05 (×3): qty 1

## 2022-09-05 MED ORDER — HYDROCORTISONE ACETATE 25 MG RE SUPP
25.0000 mg | Freq: Every day | RECTAL | Status: DC
  Administered 2022-09-05 – 2022-09-06 (×2): 25 mg via RECTAL
  Filled 2022-09-05 (×3): qty 1

## 2022-09-05 NOTE — Transfer of Care (Signed)
Immediate Anesthesia Transfer of Care Note  Patient: Beverly Kim  Procedure(s) Performed: COLONOSCOPY POLYPECTOMY HOT HEMOSTASIS (ARGON PLASMA COAGULATION/BICAP) BIOPSY  Patient Location: PACU and Endoscopy Unit  Anesthesia Type:MAC  Level of Consciousness: awake and oriented  Airway & Oxygen Therapy: Patient Spontanous Breathing and Patient connected to face mask oxygen  Post-op Assessment: Report given to RN and Post -op Vital signs reviewed and stable  Post vital signs: Reviewed and stable  Last Vitals:  Vitals Value Taken Time  BP 117/59 09/05/22 1039  Temp 36.5 C 09/05/22 1034  Pulse 63 09/05/22 1039  Resp 23 09/05/22 1039  SpO2 97 % 09/05/22 1039  Vitals shown include unvalidated device data.  Last Pain:  Vitals:   09/05/22 1039  TempSrc:   PainSc: 0-No pain      Patients Stated Pain Goal: 0 (09/03/22 2005)  Complications: No notable events documented.

## 2022-09-05 NOTE — Op Note (Signed)
[facility] Patient Name: Beverly Kim Procedure Date : 09/05/2022 MRN: [id number] Attending MD: Corliss Parish , MD, [id number] Date of Birth: [birth year] CSN: [id number] Age: 89 Admit Type: Inpatient Procedure:                Colonoscopy Indications:              Hematochezia, Heme positive stool, Acute post                            hemorrhagic anemia Providers:                Corliss Parish, MD, Stephens Shire RN, RN, Eliberto Ivory, RN, Leanne Lovely, Technician Referring MD:             Inpatient medical service Medicines:                Monitored Anesthesia Care Complications:            No immediate complications. Estimated Blood Loss:     Estimated blood loss was minimal. Procedure:                Pre-Anesthesia Assessment:                           - Prior to the procedure, a History and Physical                            was performed, and patient medications and                            allergies were reviewed. The patient's tolerance of                            previous anesthesia was also reviewed. The risks                            and benefits of the procedure and the sedation                            options and risks were discussed with the patient.                            All questions were answered, and informed consent                            was obtained. Prior Anticoagulants: The patient has                            taken Eliquis (apixaban), last dose was 3 days                            prior to procedure. ASA Grade Assessment: III - A  patient with severe systemic disease. After                            reviewing the risks and benefits, the patient was                            deemed in satisfactory condition to undergo the                            procedure.                           After obtaining informed consent, the colonoscope                            was  passed under direct vision. Throughout the                            procedure, the patient's blood pressure, pulse, and                            oxygen saturations were monitored continuously. The                            [id number]-HQ190L ([id number] Olympus colonoscope was                            introduced through the anus and advanced to the the                            cecum, identified by appendiceal orifice and                            ileocecal valve. The colonoscopy was performed                            without difficulty. The patient tolerated the                            procedure. The quality of the bowel preparation was                            adequate. The ileocecal valve, appendiceal orifice,                            and rectum were photographed. Scope In: 9:53:46 AM Scope Out: 10:21:50 AM Scope Withdrawal Time: 0 hours 24 minutes 8 seconds  Total Procedure Duration: 0 hours 28 minutes 4 seconds  Findings:      The digital rectal exam findings include hemorrhoids. Pertinent       negatives include no palpable rectal lesions.      Three sessile polyps were found in the transverse colon (1) and cecum       (2). The polyps were 2 to 4 mm in size. These polyps were removed with a  cold snare. Resection and retrieval were complete.      Four angiodysplastic lesions with typical arborization were found in the       transverse colon (3) and in the ascending colon (1). These       angiodysplasias were small to medium in size (medium 1 was located       within the transverse colon). Fulguration to ablate the lesions to       prevent bleeding by argon plasma (right colon settings) was successful.      Many medium-mouthed and small-mouthed diverticula were found in the left       colon.      Localized area of mildly erythematous mucosa was found in the distal       rectum. This was biopsied with a cold forceps for histology to rule out       proctitis.       Normal mucosa was found in the entire colon otherwise.      Non-bleeding non-thrombosed external and internal hemorrhoids were found       during retroflexion, during perianal exam and during digital exam. The       hemorrhoids were Grade II (internal hemorrhoids that prolapse but reduce       spontaneously). Impression:               - Hemorrhoids found on digital rectal exam.                           - Three 2 to 4 mm polyps in the transverse colon                            and in the cecum, removed with a cold snare.                            Resected and retrieved.                           - Four colonic angiodysplastic lesions. Treated                            with argon plasma coagulation (APC).                           - Diverticulosis in the left colon.                           - Erythematous mucosa in the very distal rectum.                            Biopsied.                           - Normal mucosa in the entire examined colon                            otherwise.                           - Non-bleeding non-thrombosed external and internal  hemorrhoids. Recommendation:           - The patient will be observed post-procedure,                            until all discharge criteria are met.                           - Return patient to hospital ward for ongoing care.                           - Advance diet as tolerated.                           - May restart apixaban on 09/08/2022 to decrease risk                            of post interventional bleeding.                           - Anusol suppositories nightly for 1 week and then                            every other night until prescription is completed                            for a total of 12 suppositories.                           - Preparation H ointment once to twice daily as                            needed                           - Calmol-4 suppositories as needed                            - Initiate FiberCon daily.                           - Continue present medications.                           - Await pathology results.                           - No recommendation at this time regarding repeat                            colonoscopy due to age. However, if evidence of                            chronic proctitis is noted on distal rectal  biopsies, may need to consider the role of                            sigmoidoscopy in future.                           - Follow-up with PCP in 2 weeks for repeat CBC.                           - Unless patient has transfusion dependent anemia                            develop, she had no evidence of iron deficiency on                            initial laboratories, no further GI workup is                            required at this time.                           - Inpatient GI service will sign off at this time.                            Please call with questions.                           - The findings and recommendations were discussed                            with the patient.                           - The findings and recommendations were discussed                            with the patient's family.                           - The findings and recommendations were discussed                            with the referring physician. Procedure Code(s):        --- Professional ---                           [phone], 59, Colonoscopy, flexible; with control of                            bleeding, any method                           45385, Colonoscopy, flexible; with removal of  tumor(s), polyp(s), or other lesion(s) by snare                            technique                           45380, 59, Colonoscopy, flexible; with biopsy,                            single or multiple Diagnosis Code(s):        --- Professional ---                           D12.3,  Benign neoplasm of transverse colon (hepatic                            flexure or splenic flexure)                           D12.0, Benign neoplasm of cecum                           K55.20, Angiodysplasia of colon without hemorrhage                           K64.1, Second degree hemorrhoids                           K62.89, Other specified diseases of anus and rectum                           K92.1, Melena (includes Hematochezia)                           R19.5, Other fecal abnormalities                           D62, Acute posthemorrhagic anemia                           K57.30, Diverticulosis of large intestine without                            perforation or abscess without bleeding CPT copyright 2022 [employer]. All rights reserved. The codes documented in this report are preliminary and upon coder review may  be revised to meet current compliance requirements. Corliss Parish, MD 09/05/2022 10:40:05 AM Number of Addenda: 0

## 2022-09-05 NOTE — Progress Notes (Signed)
Patient back from procedure. Informed of heart healthy diet order. Phlebotomy at bedside to collect labs at this time.

## 2022-09-05 NOTE — Anesthesia Preprocedure Evaluation (Addendum)
Anesthesia Evaluation  Patient identified by MRN, date of birth, ID band Patient awake    Reviewed: Allergy & Precautions, NPO status , Patient's Chart, lab work & pertinent test results, reviewed documented beta blocker date and time   History of Anesthesia Complications Negative for: history of anesthetic complications  Airway Mallampati: III  TM Distance: >3 FB Neck ROM: Full    Dental  (+) Dental Advisory Given   Pulmonary asthma    Pulmonary exam normal        Cardiovascular hypertension, Pt. on medications and Pt. on home beta blockers + CAD and +CHF  Normal cardiovascular exam+ dysrhythmias Atrial Fibrillation + Valvular Problems/Murmurs MR    '24 TTE - Global hypokinesis abnoraml septal motion worse at inferior base. EF 40 to 45%. The left ventricular internal cavity size was mildly dilated. Device lead in RA/RV. Right ventricular systolic function is moderately reduced. Left atrial size was moderately dilated. Right atrial size was moderately dilated. Moderate to severe mitral valve regurgitation. Cannot r/o device lead impingement on TV leaflets as cause for severe  TR. Tricuspid valve regurgitation is severe. Aortic valve sclerosis is present, with no evidence of aortic valve stenosis.     Neuro/Psych  Hard of hearing   negative psych ROS   GI/Hepatic Neg liver ROS, hiatal hernia,GERD  Controlled and Medicated,,  Endo/Other  diabetesHypothyroidism    Renal/GU Renal InsufficiencyRenal disease     Musculoskeletal  (+) Arthritis ,    Abdominal   Peds  Hematology  (+) Blood dyscrasia, anemia   On eliquis    Anesthesia Other Findings   Reproductive/Obstetrics                             Anesthesia Physical Anesthesia Plan  ASA: 4  Anesthesia Plan: MAC   Post-op Pain Management: Minimal or no pain anticipated   Induction:   PONV Risk Score and Plan: 2 and Propofol infusion  and Treatment may vary due to age or medical condition  Airway Management Planned: Nasal Cannula and Natural Airway  Additional Equipment: None  Intra-op Plan:   Post-operative Plan:   Informed Consent: I have reviewed the patients History and Physical, chart, labs and discussed the procedure including the risks, benefits and alternatives for the proposed anesthesia with the patient or authorized representative who has indicated his/her understanding and acceptance.       Plan Discussed with: CRNA and Anesthesiologist  Anesthesia Plan Comments:         Anesthesia Quick Evaluation

## 2022-09-05 NOTE — Consult Note (Signed)
Consultation Note Date: 09/05/2022 at 1230  Patient Name: Beverly Kim  DOB: 27-Jun-1924  MRN: [id number]  Age / Sex: 89 y.o., female  PCP: Pincus Sanes, MD Referring Physician: Dimple Nanas, MD  Reason for Consultation: Establishing goals of care  HPI/Patient Profile: 89 y.o. female  with past medical history of HTN, HLD, hypothyroidism, GERD, CHF, proximal A-fib (Eliquis), nonocclusive CAD, CHB s/p PPM, type 2 diabetes, CKD stage III, RLS, insomnia, orthopnea, and chronic nausea admitted on 09/02/2022 with feeling sick with abdominal pain.  Patient diagnosed with acute blood loss anemia.  Patient was being treated with PPI gtt. which has now been converted to p.o.  Eliquis was held for colonoscopy on 7/3, which revealed 3 polyps which were removed with a cold snare, 4 colonic angiodysplastic lesions treated with argon plasma coagulation, diverticulosis of the left colon, and nonbleeding and nonthrombosed external and internal hemorrhoids.  PMT was consulted to discuss goals of care.   Clinical Assessment and Goals of Care: I have reviewed medical records including EPIC notes, labs and imaging, assessed the patient and then met with patient, her daughter Beverly Kim, and her granddaughter at bedside\ to discuss diagnosis prognosis, GOC, EOL wishes, disposition and options.  I introduced Palliative Medicine as specialized medical care for people living with serious illness. It focuses on providing relief from the symptoms and stress of a serious illness. The goal is to improve quality of life for both the patient and the family.  We discussed a brief life review of the patient. She is a widow and mother of four with two deceased children and two living children, son Beverly Kim and daughter Beverly Kim. She lives with her daughter Beverly Kim. Patient was a homemaker.   As far as functional and nutritional status patient is  independent with ADLs PTA. She endorses a healthy appetite and no difficulty completing ADLs.   We discussed patient's current illness and what it means in the larger context of patient's on-going co-morbidities. Colonoscopy, anemia, advanced age, and functional status discussed.   I attempted to elicit values and goals of care important to the patient. Patient and family wish for patient to return home and continue living with as much independence as possible.    Advance directives, concepts specific to code status, artificial feeding and hydration, and rehospitalization were considered and discussed.  Patient shares her children are her [facility] decision makers in the event that she is unable to speak for herself.    Patient states she wants everything done so that she can live. She also states that if "you find me cold, not breathing, and without a pulse, then that's it - leave me alone." Difference between DNR, full code, and DNI status discussed.   Patient and family agree that patient would want to be a DNR in the event of a cardiopulmonary arrest, but they would like to discuss it in more detail prior to making changes to patient's code status. MOST form given for further review and education of patient's wishes.   Daughter  Beverly Kim expresses that she knows her mother's wishes and will review the MOST form with her once patient is settled back at home. Family resistant to further goals or boundaries of care discussions during this hospitalization.    Discussed with patient/family the importance of continued conversation with family and the medical providers regarding overall plan of care and treatment options, ensuring decisions are within the context of the patient's values and GOCs.    Symptoms assessed. Patient denies pain, discomfort, N/V/D/consitpation at this time. No adjustment to [facility] needed.   Questions and concerns were addressed. The family was encouraged to call with questions or  concerns.   Patient/family have PMT contact information and were encouraged to contact PMT with any future acute palliative needs.   PMT will monitor the patient peripherally and shadow her chart.   Please re-engage with PMT if goals change, at patient/family's request, or if patient's health deteriorates during hospitalization.    Primary Decision Maker PATIENT  Physical Exam Vitals reviewed.  Constitutional:      General: She is not in acute distress.    Appearance: She is normal weight.  HENT:     Head: Normocephalic.     Mouth/Throat:     Mouth: Mucous membranes are moist.  Eyes:     Pupils: Pupils are equal, round, and reactive to light.  Cardiovascular:     Rate and Rhythm: Normal rate.  Pulmonary:     Effort: Pulmonary effort is normal.  Abdominal:     Palpations: Abdomen is soft.  Skin:    General: Skin is warm and dry.  Neurological:     Mental Status: She is alert and oriented to person, place, and time.  Psychiatric:        Mood and Affect: Mood normal.        Behavior: Behavior normal.        Thought Content: Thought content normal.        Judgment: Judgment normal.     Palliative Assessment/Data: 60%     Thank you for this consult. Palliative medicine will continue to follow and assist holistically.   Time Total: 75 minutes Greater than 50%  of this time was spent counseling and coordinating care related to the above assessment and plan.  Signed by: Georgiann Cocker, DNP, FNP-BC Palliative Medicine    Please contact Palliative Medicine Team phone at [phone] for questions and concerns.  For individual provider: See Loretha Stapler

## 2022-09-05 NOTE — Anesthesia Postprocedure Evaluation (Signed)
Anesthesia Post Note  Patient: FUTURE HAUS  Procedure(s) Performed: COLONOSCOPY POLYPECTOMY HOT HEMOSTASIS (ARGON PLASMA COAGULATION/BICAP) BIOPSY     Patient location during evaluation: PACU Anesthesia Type: MAC Level of consciousness: awake and alert Pain management: pain level controlled Vital Signs Assessment: post-procedure vital signs reviewed and stable Respiratory status: spontaneous breathing, nonlabored ventilation and respiratory function stable Cardiovascular status: stable and blood pressure returned to baseline Anesthetic complications: no   No notable events documented.  Last Vitals:  Vitals:   09/05/22 1054 09/05/22 1114  BP: 113/64 (!) 134/51  Pulse: 61 64  Resp: 17 18  Temp:  (!) 36.2 C  SpO2: 97% 100%    Last Pain:  Vitals:   09/05/22 1114  TempSrc: Axillary  PainSc:                  Beryle Lathe

## 2022-09-05 NOTE — Interval H&P Note (Signed)
History and Physical Interval Note:  09/05/2022 9:23 AM  Beverly Kim  has presented today for surgery, with the diagnosis of ABLA, hematochezia.  The various methods of treatment have been discussed with the patient and family. After consideration of risks, benefits and other options for treatment, the patient has consented to  Procedure(s): COLONOSCOPY (N/A) as a surgical intervention.  The patient's history has been reviewed, patient examined, no change in status, stable for surgery.  I have reviewed the patient's chart and labs.  Questions were answered to the patient's satisfaction.     [employer]

## 2022-09-05 NOTE — Progress Notes (Signed)
PROGRESS NOTE    Beverly Kim  ZOX:[id number] DOB: 1924-03-23 DOA: 09/02/2022 PCP: Pincus Sanes, MD   Brief Narrative:  89 year old with history of HTN, HLD, hypothyroidism, GERD, systolic CHF EF 45%, paroxysmal A-fib on Eliquis, nonocclusive CAD, complete heart block status post pacemaker, DM2, CKD stage III, RLS, insomnia, chronic orthopnea and nausea comes to the hospital with complaints of generally feeling ill.  Apparently she had some bloody stool about 2 weeks ago when she was visiting [place] and visited urgent care and was prescribed Anusol which has helped her.  This time reporting of abdominal pain and discomfort as well.  Chest x-ray unremarkable.  Hemoccult positive, started on PPI drip, and holding Eliquis.  Plan is for colonoscopy in the meantime signs of volume overload with abnormal breath sounds getting Lasix as needed And bronchodilator. Underwent C scope by GI on 7/3 which showed hemorrhoids, AVM tx with APC.    Assessment & Plan:  Principal Problem:   GI bleed Active Problems:   Hypothyroidism   Hyperlipidemia   Essential hypertension   Chronic combined systolic and diastolic CHF (congestive heart failure) (HCC)   Diabetes (HCC)   Complete heart block (HCC)   Paroxysmal atrial fibrillation (HCC)   Secondary hypercoagulable state (HCC)   CKD (chronic kidney disease) stage 3, GFR 30-59 ml/min (HCC)   Diabetic neuropathy (HCC)   Acute cystitis   Acute blood loss anemia   Rectal bleeding   Guaiac positive stools    Generalized weakness, possible lower GI bleed Acute blood loss anemia - Unclear etiology.  Hemoglobin stable around 9.6 (baseline Hb 12).  Hemoglobin 9.5, Eliquis on hold.  PPI IV twice daily.  S/p C scope showing hemorrhoids, AVM s/p APC. Ok to restart Eliquis 7/6. Prep H BID prn, Fibercon daily,   Abnormal BS - Improved with bronchodilators and Lasix. Cont using IS/Flutter.   Acute on chronic combined systolic and diastolic CH, EF 40 to 45%   CHB with pacemaker in place.  Severe MR/TR -Continue Cozaar, bisoprolol.  Repeat Echo shows EF of 45% Outpatient follows with [facility] cardiology  Constipation - Bowel regimen   GERD with prior history of hiatal hernia and severe GERD -Currently on PPI   Possible acute cystitis Follow culture data.  On empiric IV Rocephin    CKD (chronic kidney disease) stage 3, GFR 30-59 ml/min Creatinine around baseline of 1.4.  Currently stable at 1.3   Essential hypertension -Stable, Cozaar and Zebeta resumed.  IV as needed   Hypothyroidism Continue Synthroid   Paroxysmal atrial fibrillation (HCC) -Currently Eliquis is on hold until 7/6.  On bisoprolol.  IV as needed     DVT prophylaxis: SCDs Start: 09/02/22 2311 Code Status: Full Family Communication:  Family at bedside PMT meeting.  Now monitor Hb. Will need PT/OT   Diet Orders (From admission, onward)     Start     Ordered   09/05/22 0000  Diet NPO time specified Except for: Sips with Meds  Diet effective midnight       Question:  Except for  Answer:  Clearance Coots with Meds   09/04/22 1129            Subjective: Tolerated C scope. Some neck discomfort from sleeping last night. Will try Tylenol and heating pack  Examination: Constitutional: Not in acute distress Respiratory: Clear to auscultation bilaterally Cardiovascular: Normal sinus rhythm, no rubs Abdomen: Nontender nondistended good bowel sounds Musculoskeletal: No edema noted Skin: No rashes seen Neurologic: CN 2-12 grossly intact.  And nonfocal Psychiatric: Normal judgment and insight. Alert and oriented x 3. Normal mood.      Objective: Vitals:   09/04/22 2339 09/05/22 0410 09/05/22 0431 09/05/22 0800  BP: 133/66 (!) 148/94  (!) 138/54  Pulse: 65 63  64  Resp: 15 20  (!) 21  Temp: 98.4 F (36.9 C) 97.9 F (36.6 C)  98.1 F (36.7 C)  TempSrc: Oral Oral  Oral  SpO2: 99% 100%  99%  Weight:   70 kg   Height:        Intake/Output Summary (Last 24 hours)  at 09/05/2022 0836 Last data filed at 09/05/2022 0300 Gross per 24 hour  Intake 3236.49 ml  Output --  Net 3236.49 ml   Filed Weights   09/03/22 0000 09/04/22 0406 09/05/22 0431  Weight: 69.2 kg 68.1 kg 70 kg    Scheduled Meds:  bisoprolol  2.5 mg Oral Daily   ipratropium-albuterol  3 mL Nebulization BID   levothyroxine  112 mcg Oral QAC breakfast   losartan  25 mg Oral Daily   metolazone  2.5 mg Oral Once per day on Mon Wed Sat   [START ON 09/06/2022] pantoprazole  40 mg Intravenous Q12H   Continuous Infusions:  sodium chloride 20 mL/hr at 09/05/22 0009   cefTRIAXone (ROCEPHIN)  IV Stopped (09/05/22 0132)    Nutritional status     Body mass index is 28.23 kg/m.  Data Reviewed:   CBC: Recent Labs  Lab 09/02/22 2001 09/02/22 2328 09/03/22 2241 09/04/22 0028 09/04/22 1114 09/04/22 2351 09/05/22 0021  WBC 7.3  --   --  6.9  --   --  7.7  HGB 10.2*   < > 8.9* 8.8* 9.8* 9.9* 9.5*  HCT 31.6*   < > 27.5* 27.5* 30.6* 30.8* 29.9*  MCV 102.6*  --   --  105.4*  --   --  104.2*  PLT 179  --   --  148*  --   --  153   < > = values in this interval not displayed.   Basic Metabolic Panel: Recent Labs  Lab 09/02/22 2001 09/03/22 0101 09/04/22 0028 09/05/22 0021  NA 134* 135 132* 133*  K 3.9 3.6 3.5 3.5  CL 96* 97* 94* 101  CO2 25 25 24  18*  GLUCOSE 124* 133* 149* 123*  BUN 45* 42* 39* 30*  CREATININE 1.59* 1.47* 1.64* 1.31*  CALCIUM 9.5 9.0 8.8* 9.1  MG  --  2.2 2.0 2.2  PHOS  --   --  2.9  --    GFR: Estimated Creatinine Clearance: 23.6 mL/min (A) (by C-G formula based on SCr of 1.31 mg/dL (H)). Liver Function Tests: Recent Labs  Lab 09/02/22 2139  AST 31  ALT 11  ALKPHOS 54  BILITOT 1.2  PROT 5.5*  ALBUMIN 3.1*   No results for input(s): "LIPASE", "AMYLASE" in the last 168 hours. No results for input(s): "AMMONIA" in the last 168 hours. Coagulation Profile: Recent Labs  Lab 09/02/22 2139  INR 1.5*   Cardiac Enzymes: No results for input(s):  "CKTOTAL", "CKMB", "CKMBINDEX", "TROPONINI" in the last 168 hours. BNP (last 3 results) No results for input(s): "PROBNP" in the last 8760 hours. HbA1C: No results for input(s): "HGBA1C" in the last 72 hours. CBG: Recent Labs  Lab 09/04/22 0602 09/04/22 1210 09/04/22 1823 09/04/22 2342 09/05/22 0600  GLUCAP 118* 116* 127* 92 105*   Lipid Profile: No results for input(s): "CHOL", "HDL", "LDLCALC", "TRIG", "CHOLHDL", "LDLDIRECT" in the last  72 hours. Thyroid Function Tests: Recent Labs    09/03/22 1139  TSH 5.034*   Anemia Panel: Recent Labs    09/03/22 1139 09/04/22 0028  VITAMINB12 871  --   FOLATE  --  10.5  FERRITIN 77  --   TIBC 395  --   IRON 80  --    Sepsis Labs: No results for input(s): "PROCALCITON", "LATICACIDVEN" in the last 168 hours.  Recent Results (from the past 240 hour(s))  Urine Culture     Status: Abnormal   Collection Time: 09/02/22  9:00 PM   Specimen: Urine, Clean Catch  Result Value Ref Range Status   Specimen Description URINE, CLEAN CATCH  Final   Special Requests   Final    ADDED 2330 Performed at [facility], [address] [place], [place] [address]    Culture MULTIPLE SPECIES PRESENT, SUGGEST RECOLLECTION (A)  Final   Report Status 09/04/2022 FINAL  Final  Culture, blood (Routine X 2) w Reflex to ID Panel     Status: None (Preliminary result)   Collection Time: 09/02/22 10:24 PM   Specimen: BLOOD  Result Value Ref Range Status   Specimen Description BLOOD BLOOD RIGHT WRIST  Final   Special Requests   Final    BOTTLES DRAWN AEROBIC AND ANAEROBIC Blood Culture results may not be optimal due to an inadequate volume of blood received in culture bottles   Culture   Final    NO GROWTH 3 DAYS Performed at [facility], [address], [place], [place] [address]    Report Status PENDING  Incomplete  Culture, blood (Routine X 2) w Reflex to ID Panel     Status: None (Preliminary result)   Collection Time: 09/02/22 10:29 PM    Specimen: BLOOD  Result Value Ref Range Status   Specimen Description BLOOD BLOOD RIGHT FOREARM  Final   Special Requests   Final    BOTTLES DRAWN AEROBIC AND ANAEROBIC Blood Culture adequate volume   Culture   Final    NO GROWTH 2 DAYS Performed at [facility], [address], [place], [place] [address]    Report Status PENDING  Incomplete         Radiology Studies: ECHOCARDIOGRAM COMPLETE  Result Date: 09/04/2022    ECHOCARDIOGRAM REPORT   Patient Name:   Beverly Kim Date of Exam: 09/04/2022 Medical Rec #:  [id number]       Height:       62.0 in Accession #:    [id number]      Weight:       150.1 lb Date of Birth:  1925/01/07       BSA:          1.692 m Patient Age:    89 years        BP:           131/57 mmHg Patient Gender: F               HR:           60 bpm. Exam Location:  Inpatient Procedure: 2D Echo, Cardiac Doppler and Color Doppler Indications:    Cardiomyopathy  History:        Patient has prior history of Echocardiogram examinations, most                 recent 06/15/2021. CHF, Arrythmias:heart block and Atrial  Fibrillation; Risk Factors:Hypertension, Dyslipidemia and                 Diabetes.  Sonographer:    Milda Smart Referring Phys: [id number] Cherlynn Popiel CHIRAG Jia Dottavio IMPRESSIONS  1. Global hypokinesis abnoraml septal motion worse at inferior base. Left ventricular ejection fraction, by estimation, is 40 to 45%. The left ventricle has mildly decreased function. The left ventricle demonstrates global hypokinesis. The left ventricular internal cavity size was mildly dilated. Left ventricular diastolic parameters are indeterminate.  2. Device lead in RA/RV . Right ventricular systolic function is moderately reduced. The right ventricular size is normal.  3. Left atrial size was moderately dilated.  4. Right atrial size was moderately dilated.  5. The mitral valve is normal in structure. Moderate to severe mitral valve regurgitation. No evidence of mitral  stenosis.  6. Cannot r/o device lead impingement on TV leaflets as cause for severe TR No 3D short axis images done . Tricuspid valve regurgitation is severe.  7. The aortic valve is tricuspid. There is mild calcification of the aortic valve. There is mild thickening of the aortic valve. Aortic valve regurgitation is not visualized. Aortic valve sclerosis is present, with no evidence of aortic valve stenosis.  8. The inferior vena cava is dilated in size with <50% respiratory variability, suggesting right atrial pressure of 15 mmHg. FINDINGS  Left Ventricle: Global hypokinesis abnoraml septal motion worse at inferior base. Left ventricular ejection fraction, by estimation, is 40 to 45%. The left ventricle has mildly decreased function. The left ventricle demonstrates global hypokinesis. The left ventricular internal cavity size was mildly dilated. There is no left ventricular hypertrophy. Left ventricular diastolic parameters are indeterminate. Right Ventricle: Device lead in RA/RV. The right ventricular size is normal. No increase in right ventricular wall thickness. Right ventricular systolic function is moderately reduced. Left Atrium: Left atrial size was moderately dilated. Right Atrium: Right atrial size was moderately dilated. Pericardium: There is no evidence of pericardial effusion. Mitral Valve: The mitral valve is normal in structure. Moderate to severe mitral valve regurgitation. No evidence of mitral valve stenosis. Tricuspid Valve: Cannot r/o device lead impingement on TV leaflets as cause for severe TR No 3D short axis images done. The tricuspid valve is normal in structure. Tricuspid valve regurgitation is severe. No evidence of tricuspid stenosis. Aortic Valve: The aortic valve is tricuspid. There is mild calcification of the aortic valve. There is mild thickening of the aortic valve. Aortic valve regurgitation is not visualized. Aortic valve sclerosis is present, with no evidence of aortic valve  stenosis. Pulmonic Valve: The pulmonic valve was normal in structure. Pulmonic valve regurgitation is mild. No evidence of pulmonic stenosis. Aorta: The aortic root is normal in size and structure. Venous: The inferior vena cava is dilated in size with less than 50% respiratory variability, suggesting right atrial pressure of 15 mmHg. IAS/Shunts: No atrial level shunt detected by color flow Doppler. Additional Comments: A device lead is visualized.  LEFT VENTRICLE PLAX 2D LVIDd:         4.60 cm     Diastology LVIDs:         3.10 cm     LV e' medial:  4.87 cm/s LV PW:         0.90 cm     LV e' lateral: 9.73 cm/s LV IVS:        0.90 cm LVOT diam:     1.90 cm LV SV:         53  LV SV Index:   32 LVOT Area:     2.84 cm  LV Volumes (MOD) LV vol d, MOD A2C: 77.1 ml LV vol d, MOD A4C: 80.9 ml LV vol s, MOD A2C: 34.2 ml LV vol s, MOD A4C: 35.9 ml LV SV MOD A2C:     42.9 ml LV SV MOD A4C:     80.9 ml LV SV MOD BP:      44.0 ml RIGHT VENTRICLE RV S prime:     7.62 cm/s TAPSE (M-mode): 1.0 cm LEFT ATRIUM             Index        RIGHT ATRIUM           Index LA diam:        4.80 cm 2.84 cm/m   RA Area:     26.40 cm LA Vol (A2C):   79.3 ml 46.86 ml/m  RA Volume:   79.20 ml  46.80 ml/m LA Vol (A4C):   81.2 ml 47.99 ml/m LA Biplane Vol: 81.1 ml 47.93 ml/m  AORTIC VALVE LVOT Vmax:   81.40 cm/s LVOT Vmean:  60.500 cm/s LVOT VTI:    0.188 m  AORTA Ao Root diam: 2.70 cm Ao Asc diam:  3.00 cm MR Peak grad:    97.6 mmHg    TRICUSPID VALVE MR Mean grad:    61.0 mmHg    TR Peak grad:   26.2 mmHg MR Vmax:         494.00 cm/s  TR Mean grad:   19.0 mmHg MR Vmean:        368.0 cm/s   TR Vmax:        256.00 cm/s MR PISA:         1.01 cm     TR Vmean:       209.0 cm/s MR PISA Eff ROA: 7 mm MR PISA Radius:  0.40 cm      SHUNTS                               Systemic VTI:  0.19 m                               Systemic Diam: 1.90 cm Charlton Haws MD Electronically signed by Charlton Haws MD Signature Date/Time: 09/04/2022/4:09:17 PM    Final     DG Chest Port 1 View  Result Date: 09/04/2022 CLINICAL DATA:  Difficulty breathing, abdominal pain EXAM: PORTABLE CHEST 1 VIEW COMPARISON:  09/02/2022 FINDINGS: Left chest cardiac device and leads in unchanged position. Cardiomegaly. Aortic atherosclerosis. Calcified granuloma in the left mid lung. No new focal pulmonary opacity. No pleural effusion or pneumothorax. No acute osseous abnormality. IMPRESSION: 1. No acute cardiopulmonary process. 2. Cardiomegaly. Electronically Signed   By: Wiliam Ke M.D.   On: 09/04/2022 12:20           LOS: 2 days   Time spent= 35 mins    Charolett Yarrow Joline Maxcy, MD [facility]  If 7PM-7AM, please contact night-coverage  09/05/2022, 8:36 AM

## 2022-09-05 NOTE — Progress Notes (Addendum)
1610: Report given to [facility] in endoscopy. She was informed that patient had stool this morning, mostly clear with small pieces of stool- scant. Stated she will check with provider to see if this was ok. States they will try to get her around 9am. Consent signed in chart from yesterday.   1030: Received the following secure chat message from [place], [place] : After her ENDO procedure pt may have some rectal bleeding; due to biopsies. Please call ENDO charge if any questions/concerns.  Patient just entered recovery at this time.

## 2022-09-06 ENCOUNTER — Inpatient Hospital Stay (HOSPITAL_COMMUNITY): Payer: PPO

## 2022-09-06 ENCOUNTER — Encounter (HOSPITAL_COMMUNITY): Payer: Self-pay | Admitting: Gastroenterology

## 2022-09-06 DIAGNOSIS — E782 Mixed hyperlipidemia: Secondary | ICD-10-CM

## 2022-09-06 DIAGNOSIS — I10 Essential (primary) hypertension: Secondary | ICD-10-CM

## 2022-09-06 DIAGNOSIS — E1142 Type 2 diabetes mellitus with diabetic polyneuropathy: Secondary | ICD-10-CM

## 2022-09-06 DIAGNOSIS — E1122 Type 2 diabetes mellitus with diabetic chronic kidney disease: Secondary | ICD-10-CM

## 2022-09-06 DIAGNOSIS — I442 Atrioventricular block, complete: Secondary | ICD-10-CM

## 2022-09-06 DIAGNOSIS — N183 Chronic kidney disease, stage 3 unspecified: Secondary | ICD-10-CM

## 2022-09-06 DIAGNOSIS — D62 Acute posthemorrhagic anemia: Secondary | ICD-10-CM | POA: Diagnosis not present

## 2022-09-06 DIAGNOSIS — E038 Other specified hypothyroidism: Secondary | ICD-10-CM

## 2022-09-06 DIAGNOSIS — D6869 Other thrombophilia: Secondary | ICD-10-CM

## 2022-09-06 DIAGNOSIS — I5042 Chronic combined systolic (congestive) and diastolic (congestive) heart failure: Secondary | ICD-10-CM

## 2022-09-06 DIAGNOSIS — K922 Gastrointestinal hemorrhage, unspecified: Secondary | ICD-10-CM | POA: Diagnosis not present

## 2022-09-06 DIAGNOSIS — N1831 Chronic kidney disease, stage 3a: Secondary | ICD-10-CM

## 2022-09-06 DIAGNOSIS — N3000 Acute cystitis without hematuria: Secondary | ICD-10-CM | POA: Diagnosis not present

## 2022-09-06 LAB — MAGNESIUM: Magnesium: 2 mg/dL (ref 1.7–2.4)

## 2022-09-06 LAB — CBC
HCT: 28 % — ABNORMAL LOW (ref 36.0–46.0)
Hemoglobin: 8.9 g/dL — ABNORMAL LOW (ref 12.0–15.0)
MCH: 32.7 pg (ref 26.0–34.0)
MCHC: 31.8 g/dL (ref 30.0–36.0)
MCV: 102.9 fL — ABNORMAL HIGH (ref 80.0–100.0)
Platelets: 145 10*3/uL — ABNORMAL LOW (ref 150–400)
RBC: 2.72 MIL/uL — ABNORMAL LOW (ref 3.87–5.11)
RDW: 16.3 % — ABNORMAL HIGH (ref 11.5–15.5)
WBC: 6.7 10*3/uL (ref 4.0–10.5)
nRBC: 0 % (ref 0.0–0.2)

## 2022-09-06 LAB — GLUCOSE, CAPILLARY
Glucose-Capillary: 116 mg/dL — ABNORMAL HIGH (ref 70–99)
Glucose-Capillary: 128 mg/dL — ABNORMAL HIGH (ref 70–99)
Glucose-Capillary: 131 mg/dL — ABNORMAL HIGH (ref 70–99)
Glucose-Capillary: 134 mg/dL — ABNORMAL HIGH (ref 70–99)
Glucose-Capillary: 134 mg/dL — ABNORMAL HIGH (ref 70–99)

## 2022-09-06 LAB — BASIC METABOLIC PANEL
Anion gap: 10 (ref 5–15)
BUN: 30 mg/dL — ABNORMAL HIGH (ref 8–23)
CO2: 21 mmol/L — ABNORMAL LOW (ref 22–32)
Calcium: 8.7 mg/dL — ABNORMAL LOW (ref 8.9–10.3)
Chloride: 99 mmol/L (ref 98–111)
Creatinine, Ser: 1.58 mg/dL — ABNORMAL HIGH (ref 0.44–1.00)
GFR, Estimated: 30 mL/min — ABNORMAL LOW (ref >60)
Glucose, Bld: 130 mg/dL — ABNORMAL HIGH (ref 70–99)
Potassium: 3.8 mmol/L (ref 3.5–5.1)
Sodium: 130 mmol/L — ABNORMAL LOW (ref 135–145)

## 2022-09-06 LAB — HEMOGLOBIN: Hemoglobin: 9 g/dL — ABNORMAL LOW (ref 12.0–15.0)

## 2022-09-06 MED ORDER — LORAZEPAM 0.5 MG PO TABS
0.5000 mg | ORAL_TABLET | Freq: Once | ORAL | Status: AC
  Administered 2022-09-06: 0.5 mg via ORAL
  Filled 2022-09-06: qty 1

## 2022-09-06 MED ORDER — POLYETHYLENE GLYCOL 3350 17 G PO PACK
17.0000 g | PACK | Freq: Every day | ORAL | Status: DC
  Administered 2022-09-06 – 2022-09-07 (×2): 17 g via ORAL
  Filled 2022-09-06 (×2): qty 1

## 2022-09-06 NOTE — Evaluation (Signed)
Occupational Therapy Evaluation Patient Details Name: Beverly Kim MRN: [id number] DOB: March 24, 1924 Today's Date: 09/06/2022   History of Present Illness 89 y.o. female admitted 6/30 with feeling sick with abdominal pain.  Patient diagnosed with acute blood loss anemia. Possible GIB and acute cystitis.  PMH:HTN, HLD, hypothyroidism, GERD, CHF, proximal A-fib (Eliquis), nonocclusive CAD, CHB s/p PPM, type 2 diabetes, CKD stage III, RLS, insomnia, orthopnea, and chronic nausea   Clinical Impression   Patient admitted for the diagnosis above.  PTA she lives at home with her daughter, who is able to provide any needed assist.  At home the patient walks with a RW, and remains Ind with ADL completion and light home management/meal prep.  Primary deficit is decreased activity tolerance, currently she is needing up to CGA for in room mobility at RW level and lower body ADL completion.  OT is indicated in the acute setting to address deficits, and assist with transition home.  No post acute OT is anticipated.        Recommendations for follow up therapy are one component of a multi-disciplinary discharge planning process, led by the attending physician.  Recommendations may be updated based on patient status, additional functional criteria and insurance authorization.   Assistance Recommended at Discharge Intermittent Supervision/Assistance  Patient can return home with the following Assist for transportation;Assistance with cooking/housework    Functional Status Assessment  Patient has had a recent decline in their functional status and demonstrates the ability to make significant improvements in function in a reasonable and predictable amount of time.  Equipment Recommendations  None recommended by OT    Recommendations for Other Services       Precautions / Restrictions Precautions Precautions: Fall Restrictions Weight Bearing Restrictions: No      Mobility Bed Mobility Overal bed  mobility: Needs Assistance Bed Mobility: Supine to Sit, Sit to Supine     Supine to sit: Modified independent (Device/Increase time) Sit to supine: Min assist        Transfers Overall transfer level: Needs assistance Equipment used: Rolling walker (2 wheels) Transfers: Sit to/from Stand Sit to Stand: Min guard                  Balance Overall balance assessment: Needs assistance Sitting-balance support: Feet supported Sitting balance-Leahy Scale: Good     Standing balance support: Reliant on assistive device for balance Standing balance-Leahy Scale: Fair                             ADL either performed or assessed with clinical judgement   ADL       Grooming: Wash/dry hands;Wash/dry face;Oral care;Supervision/safety;Standing Grooming Details (indicate cue type and reason): leaning on counter             Lower Body Dressing: Sit to/from stand;Min guard   Toilet Transfer: Supervision/safety;Rolling walker (2 wheels);Regular Toilet;Ambulation                   Vision Patient Visual Report: No change from baseline       Perception     Praxis      Pertinent Vitals/Pain Pain Assessment Pain Assessment: No/denies pain     Hand Dominance Right   Extremity/Trunk Assessment Upper Extremity Assessment Upper Extremity Assessment: Overall WFL for tasks assessed   Lower Extremity Assessment Lower Extremity Assessment: Defer to PT evaluation   Cervical / Trunk Assessment Cervical / Trunk Assessment: Normal   Communication  Communication Communication: HOH   Cognition Arousal/Alertness: Awake/alert Behavior During Therapy: WFL for tasks assessed/performed Overall Cognitive Status: Within Functional Limits for tasks assessed                                       General Comments  62 bpm, 96% on 1LO2    Exercises     Shoulder Instructions      Home Living Family/patient expects to be discharged to:: Private  residence Living Arrangements: Children Available Help at Discharge: Family;Available 24 hours/day Type of Home: House Home Access: Stairs to enter [employer] of Steps: 3 Entrance Stairs-Rails: Left Home Layout: One level     Bathroom Shower/Tub: Producer, television/film/video: Standard Bathroom Accessibility: Yes   Home Equipment: Rollator (4 wheels);Rolling Environmental consultant (2 wheels)   Additional Comments: Daughter works from home      Prior Functioning/Environment               Mobility Comments: Used rollator at all times ADLs Comments: I B/D with increased time        OT Problem List: Decreased activity tolerance      OT Treatment/Interventions: Self-care/ADL training;Therapeutic activities;Patient/family education;Balance training;DME and/or AE instruction    OT Goals(Current goals can be found in the care plan section) Acute Rehab OT Goals Patient Stated Goal: Return home soon OT Goal Formulation: With patient Time For Goal Achievement: 09/20/22 Potential to Achieve Goals: Good ADL Goals Pt Will Perform Grooming: with modified independence;standing Pt Will Perform Lower Body Dressing: with modified independence;sit to/from stand Pt Will Transfer to Toilet: with modified independence;ambulating;regular height toilet  OT Frequency: Min 1X/week    Co-evaluation              AM-PAC OT "6 Clicks" Daily Activity     Outcome Measure Help from another person eating meals?: None Help from another person taking care of personal grooming?: A Little Help from another person toileting, which includes using toliet, bedpan, or urinal?: A Little Help from another person bathing (including washing, rinsing, drying)?: A Little Help from another person to put on and taking off regular upper body clothing?: None Help from another person to put on and taking off regular lower body clothing?: A Little 6 Click Score: 20   End of Session Equipment Utilized  During Treatment: Rolling walker (2 wheels);Oxygen Nurse Communication: Mobility status  Activity Tolerance: Patient tolerated treatment well Patient left: in bed;with call bell/phone within reach  OT Visit Diagnosis: Unsteadiness on feet (R26.81)                Time: 1610-9604 OT Time Calculation (min): 22 min Charges:  OT General Charges $OT Visit: 1 Visit OT Evaluation $OT Eval Moderate Complexity: 1 Mod  09/06/2022  RP, OTR/L  Acute Rehabilitation Services  Office:  [phone]   Suzanna Obey 09/06/2022, 2:07 PM

## 2022-09-06 NOTE — Evaluation (Signed)
Physical Therapy Evaluation Patient Details Name: Beverly Kim MRN: [id number] DOB: [birth year] Today's Date: 09/06/2022  History of Present Illness  89 y.o. female admitted 6/30 with feeling sick with abdominal pain.  Patient diagnosed with acute blood loss anemia. Possible GIB and acute cystitis.  PMH:HTN, HLD, hypothyroidism, GERD, CHF, proximal A-fib (Eliquis), nonocclusive CAD, CHB s/p PPM, type 2 diabetes, CKD stage III, RLS, insomnia, orthopnea, and chronic nausea  Clinical Impression  Pt admitted with above diagnosis. Pt was able to ambulate with RW in room with good stability overall.  Pt used rollator PTA.  Will follow acutely and expect good progression.  Pt currently with functional limitations due to the deficits listed below (see PT Problem List). Pt will benefit from acute skilled PT to increase their independence and safety with mobility to allow discharge.           Assistance Recommended at Discharge Intermittent Supervision/Assistance  If plan is discharge home, recommend the following:  Can travel by private vehicle  A little help with bathing/dressing/bathroom;A little help with walking and/or transfers;Assistance with cooking/housework;Assist for transportation;Help with stairs or ramp for entrance        Equipment Recommendations None recommended by PT  Recommendations for Other Services       Functional Status Assessment Patient has had a recent decline in their functional status and demonstrates the ability to make significant improvements in function in a reasonable and predictable amount of time.     Precautions / Restrictions Precautions Precautions: Fall Restrictions Weight Bearing Restrictions: No      Mobility  Bed Mobility Overal bed mobility: Independent             General bed mobility comments: took pt incr time to come to eOB    Transfers Overall transfer level: Needs assistance Equipment used: Rolling walker (2 wheels) Transfers:  Sit to/from Stand Sit to Stand: Min guard           General transfer comment: No asssist needed but slow to rise    Ambulation/Gait Ambulation/Gait assistance: Min guard Gait Distance (Feet): 45 Feet Assistive device: Rolling walker (2 wheels) Gait Pattern/deviations: Step-through pattern, Decreased stride length   Gait velocity interpretation: <1.31 ft/sec, indicative of household ambulator   General Gait Details: Pt safe with use of RW for gait. Pt was able to stand at sink and brush her teeth with supervision.  Stairs            Wheelchair Mobility     Tilt Bed    Modified Rankin (Stroke Patients Only)       Balance                                             Pertinent Vitals/Pain Pain Assessment Pain Assessment: No/denies pain    Home Living Family/patient expects to be discharged to:: Private residence Living Arrangements: Children Available Help at Discharge: Family;Available 24 hours/day Type of Home: House Home Access: Stairs to enter Entrance Stairs-Rails: Left Entrance Stairs-Number of Steps: 3   Home Layout: One level Home Equipment: Rollator (4 wheels);Rolling Walker (2 wheels) (lift chair) Additional Comments: Daughter works from home    Prior Function               Mobility Comments: Used rollator at all times ADLs Comments: I B/D     Hand Dominance   Dominant Hand: Right  Extremity/Trunk Assessment   Upper Extremity Assessment Upper Extremity Assessment: Defer to OT evaluation    Lower Extremity Assessment Lower Extremity Assessment: Generalized weakness    Cervical / Trunk Assessment Cervical / Trunk Assessment: Normal  Communication   Communication: HOH  Cognition Arousal/Alertness: Awake/alert Behavior During Therapy: WFL for tasks assessed/performed Overall Cognitive Status: Within Functional Limits for tasks assessed                                          General  Comments General comments (skin integrity, edema, etc.): 62 bpm, 96% on 1LO2    Exercises     Assessment/Plan    PT Assessment Patient needs continued PT services  PT Problem List Decreased activity tolerance;Decreased balance;Decreased mobility;Decreased knowledge of use of DME;Decreased safety awareness;Decreased knowledge of precautions;Cardiopulmonary status limiting activity       PT Treatment Interventions DME instruction;Gait training;Functional mobility training;Therapeutic activities;Therapeutic exercise;Balance training;Patient/family education    PT Goals (Current goals can be found in the Care Plan section)  Acute Rehab PT Goals Patient Stated Goal: to go home PT Goal Formulation: With patient Time For Goal Achievement: 09/20/22 Potential to Achieve Goals: Good    Frequency Min 1X/week     Co-evaluation               AM-PAC PT "6 Clicks" Mobility  Outcome Measure Help needed turning from your back to your side while in a flat bed without using bedrails?: None Help needed moving from lying on your back to sitting on the side of a flat bed without using bedrails?: None Help needed moving to and from a bed to a chair (including a wheelchair)?: A Little Help needed standing up from a chair using your arms (e.g., wheelchair or bedside chair)?: A Little Help needed to walk in hospital room?: A Little Help needed climbing 3-5 steps with a railing? : A Lot 6 Click Score: 19    End of Session Equipment Utilized During Treatment: Gait belt;Oxygen Activity Tolerance: Patient limited by fatigue Patient left: in chair;with call bell/phone within reach;with chair alarm set Nurse Communication: Mobility status PT Visit Diagnosis: Muscle weakness (generalized) (M62.81)    Time: 4782-9562 PT Time Calculation (min) (ACUTE ONLY): 23 min   Charges:   PT Evaluation $PT Eval Moderate Complexity: 1 Mod PT Treatments $Gait Training: 8-22 mins PT General Charges $$  ACUTE PT VISIT: 1 Visit         Jong Rickman M,PT Acute Rehab Services [phone]   Bevelyn Buckles 09/06/2022, 1:45 PM

## 2022-09-06 NOTE — Progress Notes (Signed)
PROGRESS NOTE    Beverly Kim  WUJ:[id number] DOB: 1925-01-21 DOA: 09/02/2022 PCP: Pincus Sanes, MD   Brief Narrative:  89 year old with history of HTN, HLD, hypothyroidism, GERD, systolic CHF EF 45%, paroxysmal A-fib on Eliquis, nonocclusive CAD, complete heart block status post pacemaker, DM2, CKD stage III, RLS, insomnia, chronic orthopnea and nausea comes to the hospital with complaints of generally feeling ill.  Apparently she had some bloody stool about 2 weeks ago when she was visiting [place] and visited urgent care and was prescribed Anusol which has helped her.  This time reporting of abdominal pain and discomfort as well.  Chest x-ray unremarkable.  Hemoccult positive, started on PPI drip, and holding Eliquis.  Plan is for colonoscopy in the meantime signs of volume overload with abnormal breath sounds getting Lasix as needed And bronchodilator. Underwent C scope by GI on 7/3 which showed hemorrhoids, AVM tx with APC.   Assessment & Plan:   Principal Problem:   GI bleed Active Problems:   Hypothyroidism   Hyperlipidemia   Essential hypertension   Chronic combined systolic and diastolic CHF (congestive heart failure) (HCC)   Diabetes (HCC)   Complete heart block (HCC)   Paroxysmal atrial fibrillation (HCC)   Secondary hypercoagulable state (HCC)   CKD (chronic kidney disease) stage 3, GFR 30-59 ml/min (HCC)   Diabetic neuropathy (HCC)   Acute cystitis   Acute blood loss anemia   Rectal bleeding   Guaiac positive stools   Generalized weakness/ambulatory dysfunction Questionable GI bleed Acute blood loss anemia - Unclear etiology.  Hemoglobin stable around 9.6 (baseline Hb 12).  Hemoglobin 9.5, Eliquis on hold.  PPI IV twice daily.  S/p C scope showing hemorrhoids Prep H BID prn, Fibercon daily, -Hold Eliquis through 09/07/2022   Abnormal BS - Improved with bronchodilators and Lasix. Cont using IS/Flutter.    Acute on chronic combined systolic and diastolic CH,  EF 40 to 45%  CHB with pacemaker in place.  Severe MR/TR Orthostatic hypotension -Continue Cozaar, bisoprolol.   -Repeat Echo shows EF of 45% -Symptoms improving but remains somewhat orthostatic today -unable to get 3-minute standing vitals due to patient's instability will hold losartan and follow clinically, may need permissive hypertension while supine to avoid symptoms -Outpatient follows with [facility] cardiology   Constipation - Bowel regimen    GERD with prior history of hiatal hernia and severe GERD -Currently on PPI   Possible acute cystitis Follow culture data.  Completed ceftriaxone course    CKD (chronic kidney disease) stage 3, GFR 30-59 ml/min Creatinine around baseline of 1.4.  Currently stable at 1.3   Essential hypertension -Stable, Cozaar and Zebeta resumed.  IV as needed   Hypothyroidism Continue Synthroid   Paroxysmal atrial fibrillation (HCC) -Currently Eliquis is on hold until 7/6.  On bisoprolol.  DVT prophylaxis: SCDs Start: 09/02/22 2311 Code Status:   Code Status: Full Code Family Communication: At bedside  Status is: Inpatient  Dispo: The patient is from: Home              Anticipated d/c is to: Home pending PT evaluation              Anticipated d/c date is: 24 to 48 hours              Patient currently not medically stable for discharge  Consultants:  GI  Procedures:  Endoscopy, lower  Antimicrobials:  Ceftriaxone completed  Subjective: No acute issues or events overnight, continues to have difficulty with ambulation  due to balance issues/orthostatics.  Denies nausea vomiting diarrhea constipation headache fevers chills or chest pain.  Objective: Vitals:   09/06/22 0200 09/06/22 0220 09/06/22 0455 09/06/22 0500  BP:    (!) 124/47  Pulse: 62 65  63  Resp: 17 19  20   Temp:    97.9 F (36.6 C)  TempSrc:    Oral  SpO2: 100% 100%  97%  Weight:   71.4 kg   Height:        Intake/Output Summary (Last 24 hours) at 09/06/2022 0802 Last  data filed at 09/06/2022 0500 Gross per 24 hour  Intake 902.89 ml  Output 900 ml  Net 2.89 ml   Filed Weights   09/05/22 0431 09/05/22 0919 09/06/22 0455  Weight: 70 kg 70 kg 71.4 kg    Examination:  General exam: Appears calm and comfortable  Respiratory system: Clear to auscultation. Respiratory effort normal. Cardiovascular system: S1 & S2 heard, RRR. No JVD, murmurs, rubs, gallops or clicks. No pedal edema. Gastrointestinal system: Abdomen is nondistended, soft and nontender. No organomegaly or masses felt. Normal bowel sounds heard. Central nervous system: Alert and oriented. No focal neurological deficits. Extremities: Symmetric 5 x 5 power. Skin: No rashes, lesions or ulcers Psychiatry: Judgement and insight appear normal. Mood & affect appropriate.     Data Reviewed: I have personally reviewed following labs and imaging studies  CBC: Recent Labs  Lab 09/02/22 2001 09/02/22 2328 09/04/22 0028 09/04/22 1114 09/04/22 2351 09/05/22 0021 09/05/22 1115 09/05/22 2255 09/06/22 0018  WBC 7.3  --  6.9  --   --  7.7  --   --  6.7  HGB 10.2*   < > 8.8*   < > 9.9* 9.5* 9.9* 8.8* 8.9*  HCT 31.6*   < > 27.5*   < > 30.8* 29.9* 30.6* 27.1* 28.0*  MCV 102.6*  --  105.4*  --   --  104.2*  --   --  102.9*  PLT 179  --  148*  --   --  153  --   --  145*   < > = values in this interval not displayed.   Basic Metabolic Panel: Recent Labs  Lab 09/02/22 2001 09/03/22 0101 09/04/22 0028 09/05/22 0021 09/06/22 0018  NA 134* 135 132* 133* 130*  K 3.9 3.6 3.5 3.5 3.8  CL 96* 97* 94* 101 99  CO2 25 25 24  18* 21*  GLUCOSE 124* 133* 149* 123* 130*  BUN 45* 42* 39* 30* 30*  CREATININE 1.59* 1.47* 1.64* 1.31* 1.58*  CALCIUM 9.5 9.0 8.8* 9.1 8.7*  MG  --  2.2 2.0 2.2 2.0  PHOS  --   --  2.9  --   --    GFR: Estimated Creatinine Clearance: 19.7 mL/min (A) (by C-G formula based on SCr of 1.58 mg/dL (H)). Liver Function Tests: Recent Labs  Lab 09/02/22 2139  AST 31  ALT 11   ALKPHOS 54  BILITOT 1.2  PROT 5.5*  ALBUMIN 3.1*   No results for input(s): "LIPASE", "AMYLASE" in the last 168 hours. No results for input(s): "AMMONIA" in the last 168 hours. Coagulation Profile: Recent Labs  Lab 09/02/22 2139  INR 1.5*   Cardiac Enzymes: No results for input(s): "CKTOTAL", "CKMB", "CKMBINDEX", "TROPONINI" in the last 168 hours. BNP (last 3 results) No results for input(s): "PROBNP" in the last 8760 hours. HbA1C: No results for input(s): "HGBA1C" in the last 72 hours. CBG: Recent Labs  Lab 09/05/22 0600 09/05/22 1120  09/05/22 1830 09/06/22 0005 09/06/22 0604  GLUCAP 105* 112* 139* 128* 131*   Lipid Profile: No results for input(s): "CHOL", "HDL", "LDLCALC", "TRIG", "CHOLHDL", "LDLDIRECT" in the last 72 hours. Thyroid Function Tests: Recent Labs    09/03/22 1139  TSH 5.034*   Anemia Panel: Recent Labs    09/03/22 1139 09/04/22 0028  VITAMINB12 871  --   FOLATE  --  10.5  FERRITIN 77  --   TIBC 395  --   IRON 80  --    Sepsis Labs: No results for input(s): "PROCALCITON", "LATICACIDVEN" in the last 168 hours.  Recent Results (from the past 240 hour(s))  Urine Culture     Status: Abnormal   Collection Time: 09/02/22  9:00 PM   Specimen: Urine, Clean Catch  Result Value Ref Range Status   Specimen Description URINE, CLEAN CATCH  Final   Special Requests   Final    ADDED 2330 Performed at [facility], [address], [place], [place] [address]    Culture MULTIPLE SPECIES PRESENT, SUGGEST RECOLLECTION (A)  Final   Report Status 09/04/2022 FINAL  Final  Culture, blood (Routine X 2) w Reflex to ID Panel     Status: None (Preliminary result)   Collection Time: 09/02/22 10:24 PM   Specimen: BLOOD  Result Value Ref Range Status   Specimen Description BLOOD BLOOD RIGHT WRIST  Final   Special Requests   Final    BOTTLES DRAWN AEROBIC AND ANAEROBIC Blood Culture results may not be optimal due to an inadequate volume of blood received  in culture bottles   Culture   Final    NO GROWTH 3 DAYS Performed at [facility], [address], [place], [place] [address]    Report Status PENDING  Incomplete  Culture, blood (Routine X 2) w Reflex to ID Panel     Status: None (Preliminary result)   Collection Time: 09/02/22 10:29 PM   Specimen: BLOOD  Result Value Ref Range Status   Specimen Description BLOOD BLOOD RIGHT FOREARM  Final   Special Requests   Final    BOTTLES DRAWN AEROBIC AND ANAEROBIC Blood Culture adequate volume   Culture   Final    NO GROWTH 2 DAYS Performed at [facility], [address] [place], [place] [address]    Report Status PENDING  Incomplete         Radiology Studies: ECHOCARDIOGRAM COMPLETE  Result Date: 09/04/2022    ECHOCARDIOGRAM REPORT   Patient Name:   ELLEEN OSTERKAMP Date of Exam: 09/04/2022 Medical Rec #:  [id number]       Height:       62.0 in Accession #:    [id number]      Weight:       150.1 lb Date of Birth:  1924-08-19       BSA:          1.692 m Patient Age:    89 years        BP:           131/57 mmHg Patient Gender: F               HR:           60 bpm. Exam Location:  Inpatient Procedure: 2D Echo, Cardiac Doppler and Color Doppler Indications:    Cardiomyopathy  History:        Patient has prior history of Echocardiogram examinations, most  recent 06/15/2021. CHF, Arrythmias:heart block and Atrial                 Fibrillation; Risk Factors:Hypertension, Dyslipidemia and                 Diabetes.  Sonographer:    Milda Smart Referring Phys: [id number] ANKIT CHIRAG AMIN IMPRESSIONS  1. Global hypokinesis abnoraml septal motion worse at inferior base. Left ventricular ejection fraction, by estimation, is 40 to 45%. The left ventricle has mildly decreased function. The left ventricle demonstrates global hypokinesis. The left ventricular internal cavity size was mildly dilated. Left ventricular diastolic parameters are indeterminate.  2. Device lead in RA/RV . Right  ventricular systolic function is moderately reduced. The right ventricular size is normal.  3. Left atrial size was moderately dilated.  4. Right atrial size was moderately dilated.  5. The mitral valve is normal in structure. Moderate to severe mitral valve regurgitation. No evidence of mitral stenosis.  6. Cannot r/o device lead impingement on TV leaflets as cause for severe TR No 3D short axis images done . Tricuspid valve regurgitation is severe.  7. The aortic valve is tricuspid. There is mild calcification of the aortic valve. There is mild thickening of the aortic valve. Aortic valve regurgitation is not visualized. Aortic valve sclerosis is present, with no evidence of aortic valve stenosis.  8. The inferior vena cava is dilated in size with <50% respiratory variability, suggesting right atrial pressure of 15 mmHg. FINDINGS  Left Ventricle: Global hypokinesis abnoraml septal motion worse at inferior base. Left ventricular ejection fraction, by estimation, is 40 to 45%. The left ventricle has mildly decreased function. The left ventricle demonstrates global hypokinesis. The left ventricular internal cavity size was mildly dilated. There is no left ventricular hypertrophy. Left ventricular diastolic parameters are indeterminate. Right Ventricle: Device lead in RA/RV. The right ventricular size is normal. No increase in right ventricular wall thickness. Right ventricular systolic function is moderately reduced. Left Atrium: Left atrial size was moderately dilated. Right Atrium: Right atrial size was moderately dilated. Pericardium: There is no evidence of pericardial effusion. Mitral Valve: The mitral valve is normal in structure. Moderate to severe mitral valve regurgitation. No evidence of mitral valve stenosis. Tricuspid Valve: Cannot r/o device lead impingement on TV leaflets as cause for severe TR No 3D short axis images done. The tricuspid valve is normal in structure. Tricuspid valve regurgitation is  severe. No evidence of tricuspid stenosis. Aortic Valve: The aortic valve is tricuspid. There is mild calcification of the aortic valve. There is mild thickening of the aortic valve. Aortic valve regurgitation is not visualized. Aortic valve sclerosis is present, with no evidence of aortic valve stenosis. Pulmonic Valve: The pulmonic valve was normal in structure. Pulmonic valve regurgitation is mild. No evidence of pulmonic stenosis. Aorta: The aortic root is normal in size and structure. Venous: The inferior vena cava is dilated in size with less than 50% respiratory variability, suggesting right atrial pressure of 15 mmHg. IAS/Shunts: No atrial level shunt detected by color flow Doppler. Additional Comments: A device lead is visualized.  LEFT VENTRICLE PLAX 2D LVIDd:         4.60 cm     Diastology LVIDs:         3.10 cm     LV e' medial:  4.87 cm/s LV PW:         0.90 cm     LV e' lateral: 9.73 cm/s LV IVS:  0.90 cm LVOT diam:     1.90 cm LV SV:         53 LV SV Index:   32 LVOT Area:     2.84 cm  LV Volumes (MOD) LV vol d, MOD A2C: 77.1 ml LV vol d, MOD A4C: 80.9 ml LV vol s, MOD A2C: 34.2 ml LV vol s, MOD A4C: 35.9 ml LV SV MOD A2C:     42.9 ml LV SV MOD A4C:     80.9 ml LV SV MOD BP:      44.0 ml RIGHT VENTRICLE RV S prime:     7.62 cm/s TAPSE (M-mode): 1.0 cm LEFT ATRIUM             Index        RIGHT ATRIUM           Index LA diam:        4.80 cm 2.84 cm/m   RA Area:     26.40 cm LA Vol (A2C):   79.3 ml 46.86 ml/m  RA Volume:   79.20 ml  46.80 ml/m LA Vol (A4C):   81.2 ml 47.99 ml/m LA Biplane Vol: 81.1 ml 47.93 ml/m  AORTIC VALVE LVOT Vmax:   81.40 cm/s LVOT Vmean:  60.500 cm/s LVOT VTI:    0.188 m  AORTA Ao Root diam: 2.70 cm Ao Asc diam:  3.00 cm MR Peak grad:    97.6 mmHg    TRICUSPID VALVE MR Mean grad:    61.0 mmHg    TR Peak grad:   26.2 mmHg MR Vmax:         494.00 cm/s  TR Mean grad:   19.0 mmHg MR Vmean:        368.0 cm/s   TR Vmax:        256.00 cm/s MR PISA:         1.01 cm     TR  Vmean:       209.0 cm/s MR PISA Eff ROA: 7 mm MR PISA Radius:  0.40 cm      SHUNTS                               Systemic VTI:  0.19 m                               Systemic Diam: 1.90 cm Charlton Haws MD Electronically signed by Charlton Haws MD Signature Date/Time: 09/04/2022/4:09:17 PM    Final    DG Chest Port 1 View  Result Date: 09/04/2022 CLINICAL DATA:  Difficulty breathing, abdominal pain EXAM: PORTABLE CHEST 1 VIEW COMPARISON:  09/02/2022 FINDINGS: Left chest cardiac device and leads in unchanged position. Cardiomegaly. Aortic atherosclerosis. Calcified granuloma in the left mid lung. No new focal pulmonary opacity. No pleural effusion or pneumothorax. No acute osseous abnormality. IMPRESSION: 1. No acute cardiopulmonary process. 2. Cardiomegaly. Electronically Signed   By: Wiliam Ke M.D.   On: 09/04/2022 12:20        Scheduled Meds:  bisoprolol  2.5 mg Oral Daily   hydrocortisone  25 mg Rectal QHS   hydrocortisone cream   Topical BID   ipratropium-albuterol  3 mL Nebulization BID   levothyroxine  112 mcg Oral QAC breakfast   losartan  25 mg Oral Daily   metolazone  2.5 mg Oral Once per day on Mon Wed Sat  pantoprazole  40 mg Intravenous Q12H   polycarbophil  625 mg Oral Daily   Continuous Infusions:  cefTRIAXone (ROCEPHIN)  IV Stopped (09/06/22 0235)     LOS: 3 days   Time spent:  [name], DO [facility]  If 7PM-7AM, please contact night-coverage [email or web address]  09/06/2022, 8:02 AM

## 2022-09-06 NOTE — Care Management Important Message (Signed)
Important Message  Patient Details  Name: Beverly Kim MRN: [id number] Date of Birth: 1924/12/25   Medicare Important Message Given:  Yes     Renie Ora 09/06/2022, 8:42 AM

## 2022-09-06 NOTE — Progress Notes (Signed)
OT Cancellation Note  Patient Details Name: Beverly Kim MRN: [id number] DOB: Aug 27, 1924   Cancelled Treatment:    Reason Eval/Treat Not Completed: Patient at procedure or test/ unavailable.  Will continue efforts as appropriate.  Syrita Dovel D Tashonda Pinkus 09/06/2022, 8:53 AM 09/06/2022  RP, OTR/L  Acute Rehabilitation Services  Office:  [phone]

## 2022-09-06 NOTE — Progress Notes (Signed)
Unable to get 3 min standing vitals for orthostatic vitals as requested by MD.  Patient became very weak and dizzy and requested to sit back down. Vitals taken and charted.  Patient placed in bed and is resting comfortably.  No complaints of pain or dizziness once she was returned to bed.  MD in hallway and was notified.

## 2022-09-07 LAB — CULTURE, BLOOD (ROUTINE X 2): Special Requests: ADEQUATE

## 2022-09-07 LAB — GLUCOSE, CAPILLARY
Glucose-Capillary: 110 mg/dL — ABNORMAL HIGH (ref 70–99)
Glucose-Capillary: 121 mg/dL — ABNORMAL HIGH (ref 70–99)

## 2022-09-07 MED ORDER — APIXABAN 5 MG PO TABS
5.0000 mg | ORAL_TABLET | Freq: Two times a day (BID) | ORAL | 0 refills | Status: DC
Start: 2022-09-08 — End: 2022-10-16

## 2022-09-07 MED ORDER — PANTOPRAZOLE SODIUM 40 MG PO TBEC: 40.0000 mg | DELAYED_RELEASE_TABLET | Freq: Two times a day (BID) | ORAL | Status: DC

## 2022-09-07 NOTE — Progress Notes (Signed)
O2 sats were 94-96% on room air, while ambulating with walker and standby assist in the hallway.  Pt complained of dyspnea but was asymptomatic and O2 sats did not drop.

## 2022-09-07 NOTE — Progress Notes (Signed)
The patient's daughter is now picking up the patient.  Went over the discharge instructions with the daughter over the phone, because pt is very HOH. Sent the AVS with the patient.  Daughter said she will read over it.

## 2022-09-07 NOTE — TOC Transition Note (Addendum)
Transition of Care [facility]) - CM/SW Discharge Note   Patient Details  Name: Beverly Kim MRN: [id number] Date of Birth: Nov 01, 1924  Transition of Care [facility] CM/SW Contact:  Beverly Kim, Beverly Coria, RN Phone Number: 09/07/2022, 3:19 PM   Clinical Narrative:     Patient is scheduled for discharge today.  Readmission Risk Assessment done. CM spoke with daughter, Beverly Kim about Home health recommendation and she chose [facility]. CM called in referral to [facility] and acceptance voiced, info on AVS.  Outpatient referral, hospital f/u and discharge instructions on AVS. Daughter, Beverly Kim to transport at discharge.  No further TOC needs noted.      Final next level of care: Home w Home Health Services Barriers to Discharge: Barriers Resolved   Patient Goals and CMS Choice CMS Medicare.gov Compare Post Acute Care list provided to:: Patient Choice offered to / list presented to : Patient, Adult Children Beverly Kim)  Discharge Placement                  Patient to be transferred to facility by: Daughter Name of family member notified: Beverly Kim    Discharge Plan and Services Additional resources added to the After Visit Summary for   In-house Referral: NA Discharge Planning Services: CM Consult Post Acute Care Choice: NA          DME Arranged: N/A DME Agency: NA       HH Arranged: PT, OT HH Agency: [facility] Date HH Agency Contacted: 09/07/22 Time HH Agency Contacted: 1355 Representative spoke with at [facility] Agency: Morrie Sheldon  Social Determinants of Health (SDOH) Interventions SDOH Screenings   Food Insecurity: No Food Insecurity (09/03/2022)  Housing: Low Risk  (09/03/2022)  Transportation Needs: No Transportation Needs (09/03/2022)  Utilities: Not At Risk (09/03/2022)  Alcohol Screen: Low Risk  (09/11/2019)  Depression (PHQ2-9): Low Risk  (09/11/2019)  Financial Resource Strain: Low Risk  (09/11/2019)  Physical Activity: Sufficiently Active (08/19/2018)  Social Connections:  Moderately Integrated (09/11/2019)  Stress: No Stress Concern Present (09/11/2019)  Tobacco Use: Low Risk  (09/06/2022)     Readmission Risk Interventions    09/07/2022    3:18 PM  Readmission Risk Prevention Plan  Post Dischage Appt Complete  Medication Screening Complete  Transportation Screening Complete

## 2022-09-07 NOTE — Plan of Care (Signed)
Discharging to home, with her daughter.  Patient's son is providing transportation.

## 2022-09-07 NOTE — Discharge Summary (Signed)
Physician Discharge Summary  TWILIA BERHE ZOX:[id number] DOB: 1924-07-30 DOA: 09/02/2022  PCP: Pincus Sanes, MD  Admit date: 09/02/2022 Discharge date: 09/07/2022  Admitted From: Home Disposition: Home  Recommendations for Outpatient Follow-up:  Follow up with PCP in 1-2 weeks Follow-up with cardiology as scheduled  Discharge Condition: Stable CODE STATUS: Full Diet recommendation: Low-salt low-fat low-carb diet  Brief/Interim Summary: 89 year old with history of HTN, HLD, hypothyroidism, GERD, systolic CHF EF 45%, paroxysmal A-fib on Eliquis, nonocclusive CAD, complete heart block status post pacemaker, DM2, CKD stage III, RLS, insomnia, chronic orthopnea and nausea comes to the hospital with complaints of generally feeling ill. Apparently she had some bloody stool about 2 weeks ago when she was visiting [place] and visited urgent care and was prescribed Anusol which has helped her. This time reporting of abdominal pain and discomfort as well. Chest x-ray unremarkable. Hemoccult positive, started on PPI drip, and holding Eliquis. Plan is for colonoscopy in the meantime signs of volume overload with abnormal breath sounds getting Lasix as needed And bronchodilator. Underwent C scope by GI on 7/3 which showed hemorrhoids, AVM treated with APC.   Clinically patient continues to improve, currently approaching baseline.  Initial hospitalization discharge delayed as patient was having episodes of orthostatic hypotension which resolved after discontinuing her losartan.  She continues to have sporadic episodes of "shortness of breath" without any respiratory findings, hypoxia or abnormal imaging.  Lengthy discussion with patient given her symptoms of acute onset dyspnea that resolved fairly quickly are likely at least partially related to anxiety.  She denies any prior history of anxiety however we discussed that it could manifest at any age and we discussed following up with PCP to go over possible  treatment options.  Discharge Diagnoses:  Principal Problem:   GI bleed Active Problems:   Hypothyroidism   Hyperlipidemia   Essential hypertension   Chronic combined systolic and diastolic CHF (congestive heart failure) (HCC)   Diabetes (HCC)   Complete heart block (HCC)   Paroxysmal atrial fibrillation (HCC)   Secondary hypercoagulable state (HCC)   CKD (chronic kidney disease) stage 3, GFR 30-59 ml/min (HCC)   Diabetic neuropathy (HCC)   Acute cystitis   Acute blood loss anemia   Rectal bleeding   Guaiac positive stools    Discharge Instructions  Discharge Instructions     Call MD for:  difficulty breathing, headache or visual disturbances   Complete by: As directed    Call MD for:  extreme fatigue   Complete by: As directed    Call MD for:  persistant dizziness or light-headedness   Complete by: As directed    Call MD for:  temperature >100.4   Complete by: As directed    Diet - low sodium heart healthy   Complete by: As directed    Discharge instructions   Complete by: As directed    Resume eliquis 09/08/22 as discussed. Follow up with PCP and Cardiology as scheduled in the next 2 weeks.   Increase activity slowly   Complete by: As directed       Allergies as of 09/07/2022       Reactions   Adhesive [tape] Itching, Dermatitis, Rash, Other (See Comments)   Blisters and "skin bubbles"   Ace Inhibitors Cough   Atorvastatin Other (See Comments)   REACTION: Reaction not known   Clindamycin Other (See Comments)   Unknown   Codeine Other (See Comments)   hallucinations    Latex Other (See Comments)   blisters  Shellfish Allergy Other (See Comments)   "gallbladder attack"   Simvastatin Other (See Comments)   fatigue   Penicillins Rash        Medication List     STOP taking these medications    fluconazole 150 MG tablet Commonly known as: DIFLUCAN   ketoconazole 2 % cream Commonly known as: NIZORAL   losartan 25 MG tablet Commonly known as:  COZAAR   ondansetron 4 MG disintegrating tablet Commonly known as: ZOFRAN-ODT   spironolactone 25 MG tablet Commonly known as: ALDACTONE       TAKE these medications    acetaminophen 650 MG CR tablet Commonly known as: TYLENOL Take 650 mg by mouth every 8 (eight) hours as needed for pain.   albuterol 108 (90 Base) MCG/ACT inhaler Commonly known as: VENTOLIN HFA Inhale 1-2 puffs into the lungs every 6 (six) hours as needed for wheezing or shortness of breath.   Anucort-HC 25 MG suppository Generic drug: hydrocortisone Place 25 mg rectally 2 (two) times daily.   apixaban 5 MG Tabs tablet Commonly known as: ELIQUIS Take 1 tablet (5 mg total) by mouth 2 (two) times daily. Start taking on: September 08, 2022   bisoprolol 5 MG tablet Commonly known as: ZEBETA Take 0.5 tablets (2.5 mg total) by mouth daily.   furosemide 40 MG tablet Commonly known as: LASIX TAKE 1 TABLET BY MOUTH EVERY DAY What changed:  how much to take how to take this when to take this additional instructions   metolazone 2.5 MG tablet Commonly known as: ZAROXOLYN TAKE 1 TABLET 3 TIMES WEEKLY (Monday,  Wednesday, Saturday) What changed:  how much to take how to take this when to take this   pantoprazole 40 MG tablet Commonly known as: PROTONIX TAKE 1 TABLET BY MOUTH EVERY DAY   potassium chloride 10 MEQ tablet Commonly known as: KLOR-CON TAKE 1 TABLET ( 10 MEQ total) BY MOUTH ONCE DAILY ON  DAYS  METOLAZONE  IS  TAKEN What changed:  how much to take how to take this when to take this additional instructions   Synthroid 112 MCG tablet Generic drug: levothyroxine Take 1 tablet (112 mcg total) by mouth daily before breakfast.        Allergies  Allergen Reactions   Adhesive [Tape] Itching, Dermatitis, Rash and Other (See Comments)    Blisters and "skin bubbles"   Ace Inhibitors Cough   Atorvastatin Other (See Comments)    REACTION: Reaction not known   Clindamycin Other (See Comments)     Unknown   Codeine Other (See Comments)    hallucinations    Latex Other (See Comments)    blisters   Shellfish Allergy Other (See Comments)    "gallbladder attack"   Simvastatin Other (See Comments)    fatigue   Penicillins Rash    Consultations: GI, [place]  Procedures/Studies: DG CHEST PORT 1 VIEW  Result Date: 09/06/2022 CLINICAL DATA:  Shortness of breath EXAM: PORTABLE CHEST 1 VIEW COMPARISON:  09/29/2022 FINDINGS: Calcified left upper lobe granuloma. Lungs otherwise clear. No frank interstitial edema. No pleural effusion or pneumothorax. Cardiomegaly. Thoracic aortic atherosclerosis. Left subclavian pacemaker. IMPRESSION: Cardiomegaly. No acute cardiopulmonary disease. Electronically Signed   By: Charline Bills M.D.   On: 09/06/2022 21:21   ECHOCARDIOGRAM COMPLETE  Result Date: 09/04/2022    ECHOCARDIOGRAM REPORT   Patient Name:   Beverly Kim Date of Exam: 09/04/2022 Medical Rec #:  [id number]       Height:  62.0 in Accession #:    [id number]      Weight:       150.1 lb Date of Birth:  26-Aug-1924       BSA:          1.692 m Patient Age:    89 years        BP:           131/57 mmHg Patient Gender: F               HR:           60 bpm. Exam Location:  Inpatient Procedure: 2D Echo, Cardiac Doppler and Color Doppler Indications:    Cardiomyopathy  History:        Patient has prior history of Echocardiogram examinations, most                 recent 06/15/2021. CHF, Arrythmias:heart block and Atrial                 Fibrillation; Risk Factors:Hypertension, Dyslipidemia and                 Diabetes.  Sonographer:    Milda Smart Referring Phys: [id number] ANKIT CHIRAG AMIN IMPRESSIONS  1. Global hypokinesis abnoraml septal motion worse at inferior base. Left ventricular ejection fraction, by estimation, is 40 to 45%. The left ventricle has mildly decreased function. The left ventricle demonstrates global hypokinesis. The left ventricular internal cavity size was mildly dilated. Left  ventricular diastolic parameters are indeterminate.  2. Device lead in RA/RV . Right ventricular systolic function is moderately reduced. The right ventricular size is normal.  3. Left atrial size was moderately dilated.  4. Right atrial size was moderately dilated.  5. The mitral valve is normal in structure. Moderate to severe mitral valve regurgitation. No evidence of mitral stenosis.  6. Cannot r/o device lead impingement on TV leaflets as cause for severe TR No 3D short axis images done . Tricuspid valve regurgitation is severe.  7. The aortic valve is tricuspid. There is mild calcification of the aortic valve. There is mild thickening of the aortic valve. Aortic valve regurgitation is not visualized. Aortic valve sclerosis is present, with no evidence of aortic valve stenosis.  8. The inferior vena cava is dilated in size with <50% respiratory variability, suggesting right atrial pressure of 15 mmHg. FINDINGS  Left Ventricle: Global hypokinesis abnoraml septal motion worse at inferior base. Left ventricular ejection fraction, by estimation, is 40 to 45%. The left ventricle has mildly decreased function. The left ventricle demonstrates global hypokinesis. The left ventricular internal cavity size was mildly dilated. There is no left ventricular hypertrophy. Left ventricular diastolic parameters are indeterminate. Right Ventricle: Device lead in RA/RV. The right ventricular size is normal. No increase in right ventricular wall thickness. Right ventricular systolic function is moderately reduced. Left Atrium: Left atrial size was moderately dilated. Right Atrium: Right atrial size was moderately dilated. Pericardium: There is no evidence of pericardial effusion. Mitral Valve: The mitral valve is normal in structure. Moderate to severe mitral valve regurgitation. No evidence of mitral valve stenosis. Tricuspid Valve: Cannot r/o device lead impingement on TV leaflets as cause for severe TR No 3D short axis images  done. The tricuspid valve is normal in structure. Tricuspid valve regurgitation is severe. No evidence of tricuspid stenosis. Aortic Valve: The aortic valve is tricuspid. There is mild calcification of the aortic valve. There is mild thickening of the aortic valve. Aortic valve regurgitation is  not visualized. Aortic valve sclerosis is present, with no evidence of aortic valve stenosis. Pulmonic Valve: The pulmonic valve was normal in structure. Pulmonic valve regurgitation is mild. No evidence of pulmonic stenosis. Aorta: The aortic root is normal in size and structure. Venous: The inferior vena cava is dilated in size with less than 50% respiratory variability, suggesting right atrial pressure of 15 mmHg. IAS/Shunts: No atrial level shunt detected by color flow Doppler. Additional Comments: A device lead is visualized.  LEFT VENTRICLE PLAX 2D LVIDd:         4.60 cm     Diastology LVIDs:         3.10 cm     LV e' medial:  4.87 cm/s LV PW:         0.90 cm     LV e' lateral: 9.73 cm/s LV IVS:        0.90 cm LVOT diam:     1.90 cm LV SV:         53 LV SV Index:   32 LVOT Area:     2.84 cm  LV Volumes (MOD) LV vol d, MOD A2C: 77.1 ml LV vol d, MOD A4C: 80.9 ml LV vol s, MOD A2C: 34.2 ml LV vol s, MOD A4C: 35.9 ml LV SV MOD A2C:     42.9 ml LV SV MOD A4C:     80.9 ml LV SV MOD BP:      44.0 ml RIGHT VENTRICLE RV S prime:     7.62 cm/s TAPSE (M-mode): 1.0 cm LEFT ATRIUM             Index        RIGHT ATRIUM           Index LA diam:        4.80 cm 2.84 cm/m   RA Area:     26.40 cm LA Vol (A2C):   79.3 ml 46.86 ml/m  RA Volume:   79.20 ml  46.80 ml/m LA Vol (A4C):   81.2 ml 47.99 ml/m LA Biplane Vol: 81.1 ml 47.93 ml/m  AORTIC VALVE LVOT Vmax:   81.40 cm/s LVOT Vmean:  60.500 cm/s LVOT VTI:    0.188 m  AORTA Ao Root diam: 2.70 cm Ao Asc diam:  3.00 cm MR Peak grad:    97.6 mmHg    TRICUSPID VALVE MR Mean grad:    61.0 mmHg    TR Peak grad:   26.2 mmHg MR Vmax:         494.00 cm/s  TR Mean grad:   19.0 mmHg MR Vmean:         368.0 cm/s   TR Vmax:        256.00 cm/s MR PISA:         1.01 cm     TR Vmean:       209.0 cm/s MR PISA Eff ROA: 7 mm MR PISA Radius:  0.40 cm      SHUNTS                               Systemic VTI:  0.19 m                               Systemic Diam: 1.90 cm Charlton Haws MD Electronically signed by Charlton Haws MD Signature Date/Time: 09/04/2022/4:09:17 PM    Final  DG Chest Port 1 View  Result Date: 09/04/2022 CLINICAL DATA:  Difficulty breathing, abdominal pain EXAM: PORTABLE CHEST 1 VIEW COMPARISON:  09/02/2022 FINDINGS: Left chest cardiac device and leads in unchanged position. Cardiomegaly. Aortic atherosclerosis. Calcified granuloma in the left mid lung. No new focal pulmonary opacity. No pleural effusion or pneumothorax. No acute osseous abnormality. IMPRESSION: 1. No acute cardiopulmonary process. 2. Cardiomegaly. Electronically Signed   By: Wiliam Ke M.D.   On: 09/04/2022 12:20   DG Chest 2 View  Result Date: 09/02/2022 CLINICAL DATA:  Shortness of breath, fatigue EXAM: CHEST - 2 VIEW COMPARISON:  06/02/2020 FINDINGS: Calcified granuloma in the left mid lung. Lungs are otherwise clear. No pleural effusion or pneumothorax. Cardiomegaly. Left subclavian pacemaker. Thoracic aortic atherosclerosis. Mild degenerative changes of the left shoulder. Thoracic spine is within normal limits. IMPRESSION: Normal chest radiographs. Electronically Signed   By: Charline Bills M.D.   On: 09/02/2022 20:35     Subjective: Repeat episode of dyspnea reported overnight with negative workup, patient indicates transient episode and denies any further ongoing symptoms.  Discharge Exam: Vitals:   09/07/22 0904 09/07/22 1158  BP:  (!) 106/58  Pulse:  63  Resp:  18  Temp:  97.7 F (36.5 C)  SpO2: 99% 95%   Vitals:   09/07/22 0800 09/07/22 0805 09/07/22 0904 09/07/22 1158  BP:    (!) 106/58  Pulse:  68  63  Resp:  13  18  Temp:    97.7 F (36.5 C)  TempSrc:    Oral  SpO2: 98% 97% 99% 95%   Weight:      Height:        General: Pt is alert, awake, not in acute distress, extremely hard of hearing Cardiovascular: RRR, S1/S2 +, no rubs, no gallops Respiratory: CTA bilaterally, no wheezing, no rhonchi Abdominal: Soft, NT, ND, bowel sounds + Extremities: no edema, no cyanosis   The results of significant diagnostics from this hospitalization (including imaging, microbiology, ancillary and laboratory) are listed below for reference.     Microbiology: Recent Results (from the past 240 hour(s))  Urine Culture     Status: Abnormal   Collection Time: 09/02/22  9:00 PM   Specimen: Urine, Clean Catch  Result Value Ref Range Status   Specimen Description URINE, CLEAN CATCH  Final   Special Requests   Final    ADDED 2330 Performed at [facility], [address], [place], [place] [address]    Culture MULTIPLE SPECIES PRESENT, SUGGEST RECOLLECTION (A)  Final   Report Status 09/04/2022 FINAL  Final  Culture, blood (Routine X 2) w Reflex to ID Panel     Status: None   Collection Time: 09/02/22 10:24 PM   Specimen: BLOOD  Result Value Ref Range Status   Specimen Description BLOOD BLOOD RIGHT WRIST  Final   Special Requests   Final    BOTTLES DRAWN AEROBIC AND ANAEROBIC Blood Culture results may not be optimal due to an inadequate volume of blood received in culture bottles   Culture   Final    NO GROWTH 5 DAYS Performed at [facility], [address], [place], [place] [address]    Report Status 09/07/2022 FINAL  Final  Culture, blood (Routine X 2) w Reflex to ID Panel     Status: None (Preliminary result)   Collection Time: 09/02/22 10:29 PM   Specimen: BLOOD  Result Value Ref Range Status   Specimen Description BLOOD BLOOD RIGHT FOREARM  Final   Special Requests   Final    BOTTLES DRAWN AEROBIC AND ANAEROBIC Blood Culture adequate volume   Culture   Final    NO GROWTH 4 DAYS Performed at [facility], [address], [place], [place] [address]     Report Status PENDING  Incomplete     Labs: BNP (last 3 results) Recent Labs    11/14/21 1713 09/02/22 2001 09/04/22 1114  BNP 297.5* 436.2* 297.0*   Basic Metabolic Panel: Recent Labs  Lab 09/02/22 2001 09/03/22 0101 09/04/22 0028 09/05/22 0021 09/06/22 0018  NA 134* 135 132* 133* 130*  K 3.9 3.6 3.5 3.5 3.8  CL 96* 97* 94* 101 99  CO2 25 25 24  18* 21*  GLUCOSE 124* 133* 149* 123* 130*  BUN 45* 42* 39* 30* 30*  CREATININE 1.59* 1.47* 1.64* 1.31* 1.58*  CALCIUM 9.5 9.0 8.8* 9.1 8.7*  MG  --  2.2 2.0 2.2 2.0  PHOS  --   --  2.9  --   --    Liver Function Tests: Recent Labs  Lab 09/02/22 2139  AST 31  ALT 11  ALKPHOS 54  BILITOT 1.2  PROT 5.5*  ALBUMIN 3.1*   No results for input(s): "LIPASE", "AMYLASE" in the last 168 hours. No results for input(s): "AMMONIA" in the last 168 hours. CBC: Recent Labs  Lab 09/02/22 2001 09/02/22 2328 09/04/22 0028 09/04/22 1114 09/04/22 2351 09/05/22 0021 09/05/22 1115 09/05/22 2255 09/06/22 0018 09/06/22 2149  WBC 7.3  --  6.9  --   --  7.7  --   --  6.7  --   HGB 10.2*   < > 8.8*   < > 9.9* 9.5* 9.9* 8.8* 8.9* 9.0*  HCT 31.6*   < > 27.5*   < > 30.8* 29.9* 30.6* 27.1* 28.0*  --   MCV 102.6*  --  105.4*  --   --  104.2*  --   --  102.9*  --   PLT 179  --  148*  --   --  153  --   --  145*  --    < > = values in this interval not displayed.   Cardiac Enzymes: No results for input(s): "CKTOTAL", "CKMB", "CKMBINDEX", "TROPONINI" in the last 168 hours. BNP: Invalid input(s): "POCBNP" CBG: Recent Labs  Lab 09/06/22 1221 09/06/22 1814 09/06/22 2338 09/07/22 0602 09/07/22 1241  GLUCAP 134* 134* 116* 110* 121*   D-Dimer No results for input(s): "DDIMER" in the last 72 hours. Hgb A1c No results for input(s): "HGBA1C" in the last 72 hours. Lipid Profile No results for input(s): "CHOL", "HDL", "LDLCALC", "TRIG", "CHOLHDL", "LDLDIRECT" in the last 72 hours. Thyroid function studies No results for input(s): "TSH",  "T4TOTAL", "T3FREE", "THYROIDAB" in the last 72 hours.  Invalid input(s): "FREET3" Anemia work up No results for input(s): "VITAMINB12", "FOLATE", "FERRITIN", "TIBC", "IRON", "RETICCTPCT" in the last 72 hours. Urinalysis    Component Value Date/Time   COLORURINE YELLOW 09/02/2022 2100   APPEARANCEUR HAZY (A) 09/02/2022 2100   LABSPEC 1.012 09/02/2022 2100   PHURINE 6.0 09/02/2022 2100   GLUCOSEU NEGATIVE 09/02/2022 2100   HGBUR SMALL (A) 09/02/2022 2100   HGBUR 2+ 12/13/2008 1032   BILIRUBINUR NEGATIVE 09/02/2022 2100   BILIRUBINUR n 12/25/2010 1311   KETONESUR NEGATIVE 09/02/2022 2100   PROTEINUR NEGATIVE 09/02/2022 2100   UROBILINOGEN 0.2 12/25/2010 1311   UROBILINOGEN 0.2 12/13/2008 1032   NITRITE NEGATIVE 09/02/2022 2100   LEUKOCYTESUR MODERATE (A) 09/02/2022  2100   Sepsis Labs Recent Labs  Lab 09/02/22 2001 09/04/22 0028 09/05/22 0021 09/06/22 0018  WBC 7.3 6.9 7.7 6.7   Microbiology Recent Results (from the past 240 hour(s))  Urine Culture     Status: Abnormal   Collection Time: 09/02/22  9:00 PM   Specimen: Urine, Clean Catch  Result Value Ref Range Status   Specimen Description URINE, CLEAN CATCH  Final   Special Requests   Final    ADDED 2330 Performed at [facility], [address], [place], [place] [address]    Culture MULTIPLE SPECIES PRESENT, SUGGEST RECOLLECTION (A)  Final   Report Status 09/04/2022 FINAL  Final  Culture, blood (Routine X 2) w Reflex to ID Panel     Status: None   Collection Time: 09/02/22 10:24 PM   Specimen: BLOOD  Result Value Ref Range Status   Specimen Description BLOOD BLOOD RIGHT WRIST  Final   Special Requests   Final    BOTTLES DRAWN AEROBIC AND ANAEROBIC Blood Culture results may not be optimal due to an inadequate volume of blood received in culture bottles   Culture   Final    NO GROWTH 5 DAYS Performed at [facility], [address], [place], [place] [address]    Report Status 09/07/2022 FINAL  Final   Culture, blood (Routine X 2) w Reflex to ID Panel     Status: None (Preliminary result)   Collection Time: 09/02/22 10:29 PM   Specimen: BLOOD  Result Value Ref Range Status   Specimen Description BLOOD BLOOD RIGHT FOREARM  Final   Special Requests   Final    BOTTLES DRAWN AEROBIC AND ANAEROBIC Blood Culture adequate volume   Culture   Final    NO GROWTH 4 DAYS Performed at [facility], [address] [place], [place] [address]    Report Status PENDING  Incomplete     Time coordinating discharge: Over 30 minutes  SIGNED:   [name], DO [facility] 09/07/2022, 1:09 PM Pager   If 7PM-7AM, please contact night-coverage [email or web address]

## 2022-09-07 NOTE — Consult Note (Signed)
   [facility] CM Inpatient Consult   09/07/2022  Beverly Kim 1925/01/31 [id number]  [facility] [[facility]]  Accountable Care Organization [ACO] Patient: [name]  [facility] Liaison remote coverage review for patient admitted to [facility]  Primary Care Provider:  Pincus Sanes, MD with [place] at [facility] which is listed to provide the transition of care follow up.   Patient screened for hospitalization with noted low  risk score for unplanned readmission risk 4 day length of stay and  to assess for potential [employer]  [[facility]] Care Management service needs for post hospital transition for care coordination.  Review of patient's electronic medical record reveals patient is admitted for GI Bleeding.   Plan:  Referral request for community care coordination: no referral needs as anticipated follow up by community [facility] team for follow up on behalf of [facility].  Patient has [facility] listed in [employer] for Care Management/Coordination.  Of note, [facility] Care Management/Population Health does not replace or interfere with any arrangements made by the Inpatient Transition of Care team.  For questions contact:   Charlesetta Shanks, RN BSN CCM [facility]  [phone] business mobile phone Toll free office [phone]  *Concierge Line  [phone] Fax number: [phone] [email or web address]

## 2022-09-09 ENCOUNTER — Encounter: Payer: Self-pay | Admitting: Internal Medicine

## 2022-09-09 NOTE — Assessment & Plan Note (Signed)
History of GI bleed Recent admission for GI bleed-AVM seen on colonoscopy treated with APC Eliquis held temporarily and placed on PPI H/H stable Continue

## 2022-09-09 NOTE — Progress Notes (Signed)
Subjective:    Patient ID: Beverly Kim, female    DOB: 11-30-1924, 89 y.o.   MRN: [id number]     HPI Charleen is here for follow up from the hospital.   Admitted 6/30-7/5  Went to the ED complaining of feeling ill.  Also had bloody stool 2 weeks ago-visited urgent care at that time and was prescribed Anusol which helped.  She did have abdominal pain and discomfort at that time.  She was Hemoccult positive.  Found to have anemia.  Started on PPI drip, Eliquis held.  Plan was to do colonoscopy.  Was fluid overloaded and required Lasix and bronchodilators.  Scope 7/3 showed hemorrhoids, AVM treated with APC.  She improved.  Hospital discharge delayed secondary to orthostatic hypotension which resolved after discontinuing losartan.  Continue to have episodes of shortness of breath.  There is no hypoxia or abnormal imaging-possible anxiety related.   GI bleed-  Episodes of shortness of breath,?  Anxiety-   ? Palliative care    Medications and allergies reviewed with patient and updated if appropriate.  Current Outpatient Medications on File Prior to Visit  Medication Sig Dispense Refill  . acetaminophen (TYLENOL) 650 MG CR tablet Take 650 mg by mouth every 8 (eight) hours as needed for pain.    Marland Kitchen albuterol (VENTOLIN HFA) 108 (90 Base) MCG/ACT inhaler Inhale 1-2 puffs into the lungs every 6 (six) hours as needed for wheezing or shortness of breath. 1 each 2  . ANUCORT-HC 25 MG suppository Place 25 mg rectally 2 (two) times daily.    Marland Kitchen apixaban (ELIQUIS) 5 MG TABS tablet Take 1 tablet (5 mg total) by mouth 2 (two) times daily. 60 tablet 0  . bisoprolol (ZEBETA) 5 MG tablet Take 0.5 tablets (2.5 mg total) by mouth daily. 45 tablet 2  . furosemide (LASIX) 40 MG tablet TAKE 1 TABLET BY MOUTH EVERY DAY (Patient taking differently: Take 40 mg by mouth daily.) 90 tablet 3  . metolazone (ZAROXOLYN) 2.5 MG tablet TAKE 1 TABLET 3 TIMES WEEKLY (Monday,  Wednesday, Saturday) (Patient  taking differently: Take 2.5 mg by mouth See admin instructions. TAKE 1 TABLET 3 TIMES WEEKLY (Monday,  Wednesday, Saturday)) 36 tablet 8  . pantoprazole (PROTONIX) 40 MG tablet TAKE 1 TABLET BY MOUTH EVERY DAY 90 tablet 1  . potassium chloride (KLOR-CON) 10 MEQ tablet TAKE 1 TABLET ( 10 MEQ total) BY MOUTH ONCE DAILY ON  DAYS  METOLAZONE  IS  TAKEN (Patient taking differently: Take 10 mEq by mouth daily.) 30 tablet 11  . SYNTHROID 112 MCG tablet Take 1 tablet (112 mcg total) by mouth daily before breakfast. 90 tablet 3   No current facility-administered medications on file prior to visit.     Review of Systems     Objective:  There were no vitals filed for this visit. BP Readings from Last 3 Encounters:  09/12/22 130/72  09/07/22 (!) 106/58  05/08/22 124/78   Wt Readings from Last 3 Encounters:  09/12/22 156 lb (70.8 kg)  09/07/22 159 lb 9.8 oz (72.4 kg)  05/08/22 151 lb (68.5 kg)   There is no height or weight on file to calculate BMI.    Physical Exam     Lab Results  Component Value Date   WBC 6.7 09/06/2022   HGB 9.0 (L) 09/06/2022   HCT 28.0 (L) 09/06/2022   PLT 145 (L) 09/06/2022   GLUCOSE 130 (H) 09/06/2022   CHOL 132 05/08/2022   TRIG  86.0 05/08/2022   HDL 39.50 05/08/2022   LDLDIRECT 163.3 01/30/2012   LDLCALC 75 05/08/2022   ALT 11 09/02/2022   AST 31 09/02/2022   NA 130 (L) 09/06/2022   K 3.8 09/06/2022   CL 99 09/06/2022   CREATININE 1.58 (H) 09/06/2022   BUN 30 (H) 09/06/2022   CO2 21 (L) 09/06/2022   TSH 5.034 (H) 09/03/2022   INR 1.5 (H) 09/02/2022   HGBA1C 6.7 (H) 05/08/2022   DG CHEST PORT 1 VIEW CLINICAL DATA:  Shortness of breath  EXAM: PORTABLE CHEST 1 VIEW  COMPARISON:  09/29/2022  FINDINGS: Calcified left upper lobe granuloma. Lungs otherwise clear. No frank interstitial edema. No pleural effusion or pneumothorax.  Cardiomegaly. Thoracic aortic atherosclerosis. Left subclavian pacemaker.  IMPRESSION: Cardiomegaly.  No  acute cardiopulmonary disease.  Electronically Signed   By: Charline Bills M.D.   On: 09/06/2022 21:21    Assessment & Plan:    See Problem List for Assessment and Plan of chronic medical problems.    This encounter was created in error - please disregard.

## 2022-09-09 NOTE — Patient Instructions (Addendum)
      Blood work was ordered.   The lab is on the first floor.    Medications changes include :       A referral was ordered and someone will call you to schedule an appointment.     Return for follow up as scheduled.  

## 2022-09-10 ENCOUNTER — Telehealth: Payer: Self-pay | Admitting: *Deleted

## 2022-09-10 ENCOUNTER — Encounter: Payer: Self-pay | Admitting: Gastroenterology

## 2022-09-10 ENCOUNTER — Encounter: Payer: PPO | Admitting: Internal Medicine

## 2022-09-10 ENCOUNTER — Encounter: Payer: Self-pay | Admitting: *Deleted

## 2022-09-10 DIAGNOSIS — K922 Gastrointestinal hemorrhage, unspecified: Secondary | ICD-10-CM

## 2022-09-10 DIAGNOSIS — I10 Essential (primary) hypertension: Secondary | ICD-10-CM

## 2022-09-10 DIAGNOSIS — K649 Unspecified hemorrhoids: Secondary | ICD-10-CM | POA: Diagnosis not present

## 2022-09-10 LAB — SURGICAL PATHOLOGY

## 2022-09-10 NOTE — Patient Outreach (Signed)
  Care Coordination   09/10/2022 Name: MALAI MURRAH MRN: [id number] DOB: [birth year]   Care Coordination Outreach Attempts:  An unsuccessful telephone outreach was attempted today to offer the patient information about available care coordination services.  Received incoming call/ voice mail from patient's caregiver/ daughter [name], whom I had spoken with earlier today for [facility]; in her message, [name] asked whether or not it was okay to give patient (previously ordered) Zofran, which was discontinued at time of hospital discharge on 09/07/22  I returned message to caregiver and advised her to follow hospital discharge instructions around medications; noted that HFU office visit with PCP, Dr. Lawerance Bach has been re-scheduled from this afternoon to 09/12/22-- encouraged caregiver to ensure that patient attends visit as scheduled  Again provided my direct phone number should patient/ caregiver have ongoing questions/ needs in the future prior to scheduled RN CM Care Coordinator outreach on 09/25/22  Follow Up Plan:  No further outreach attempts will be made at this time-- caregiver has my direct contact information; encouraged her to return my call if she has additional/ ongoing questions/ concerns  Encounter Outcome:  No Answer left detailed voice message as above   Care Coordination Interventions:  No, not indicated unsuccessful outreach attempt   Caryl Pina, RN, BSN, CCRN Alumnus RN CM Care Coordination/ Transition of Care- [facility] Care Management [phone]: direct office

## 2022-09-10 NOTE — Transitions of Care (Post Inpatient/ED Visit) (Signed)
09/10/2022  Name: Beverly Kim MRN: [id number] DOB: [birth year]  Today's TOC FU Call Status: Today's TOC FU Call Status:: Successful TOC FU Call Competed TOC FU Call Complete Date: 09/10/22  Transition Care Management Follow-up Telephone Call Date of Discharge: 09/07/22 Discharge Facility: [facility]) Type of Discharge: Inpatient Admission Primary Inpatient Discharge Diagnosis:: GI Bleeding; orthostatic hypotension How have you been since you were released from the hospital?: Better ("Overall doing okay; she is refusing to take the blood thinner-- said she's only had problems with nosebleeds and GI bleeding since she started taking it and now she doesn't want to take it.  She is independent for the most part, I oversee everything") Any questions or concerns?: Yes Patient Questions/Concerns:: Daughter reports patient refusing to take ACT due to side effects of bleeding- daughter verbalizes proactive plans to discuss with PCP during today's HFU OV- this was encouraged Patient Questions/Concerns Addressed: Notified Provider of Patient Questions/Concerns  Items Reviewed: Did you receive and understand the discharge instructions provided?: Yes (thoroughly reviewed with patient's daughter/ caregiver who verbalizes good understanding of same) Medications obtained,verified, and reconciled?: Yes (Medications Reviewed) (Full medication reconciliation/ review completed; no concerns or discrepancies identified; confirmed patient obtained/ is taking all newly Rx'd medications as instructed; self-manages medications and denies questions/ concerns around medications today) Any new allergies since your discharge?: No Dietary orders reviewed?: Yes Type of Diet Ordered:: Heart Healthy, low salt Do you have support at home?: Yes People in Home: child(ren), adult Name of Support/Comfort Primary Source: Daughter reports essentially independent in self-care activities; supportive adult daughter resides  with patient and assists/ supervises as/ if needed/ indicated; adult local son also assists  Medications Reviewed Today: Medications Reviewed Today     Reviewed by Michaela Corner, RN (Registered Nurse) on 09/10/22 at 1035  Med List Status: <None>   Medication Order Taking? Sig Documenting Provider Last Dose Status Informant  acetaminophen (TYLENOL) 650 MG CR tablet [id number] Yes Take 650 mg by mouth every 8 (eight) hours as needed for pain. [provider] Taking Active Self, Pharmacy Records  albuterol (VENTOLIN HFA) 108 (90 Base) MCG/ACT inhaler [id number] Yes Inhale 1-2 puffs into the lungs every 6 (six) hours as needed for wheezing or shortness of breath. Pincus Sanes, MD Taking Active Self, Pharmacy Records  [facility] 25 MG suppository [id number] Yes Place 25 mg rectally 2 (two) times daily. [provider] Taking Active Self, Pharmacy Records  apixaban (ELIQUIS) 5 MG TABS tablet [id number] No Take 1 tablet (5 mg total) by mouth 2 (two) times daily.  Patient not taking: Reported on 09/10/2022   [name], MD Not Taking Active            Med Note Marilu Favre Sep 10, 2022 10:32 AM) 09/10/22: Daughter reports during [facility] call, patient is not taking-- states she has had nosebleeds/ GI bleeding and other issues since she started taking-- she plans to discuss with PCP at [facility] appointment this afternoon and this was advised   bisoprolol (ZEBETA) 5 MG tablet [id number] Yes Take 0.5 tablets (2.5 mg total) by mouth daily. Marykay Lex, MD Taking Active Self, Pharmacy Records  furosemide (LASIX) 40 MG tablet [id number] Yes TAKE 1 TABLET BY MOUTH EVERY DAY  Patient taking differently: Take 40 mg by mouth daily.   Marykay Lex, MD Taking Active Self, Pharmacy Records  metolazone (ZAROXOLYN) 2.5 MG tablet [id number] Yes TAKE 1 TABLET 3 TIMES WEEKLY (Monday,  Wednesday, Saturday)  Patient  taking differently: Take 2.5 mg by mouth See admin instructions. TAKE 1 TABLET  3 TIMES WEEKLY (Monday,  Wednesday, Saturday)   Marykay Lex, MD Taking Active Self, Pharmacy Records  pantoprazole (PROTONIX) 40 MG tablet [id number] Yes TAKE 1 TABLET BY MOUTH EVERY DAY Doree Albee, PA-C Taking Active Self, Pharmacy Records  potassium chloride (KLOR-CON) 10 MEQ tablet [id number] Yes TAKE 1 TABLET ( 10 MEQ total) BY MOUTH ONCE DAILY ON  DAYS  METOLAZONE  IS  TAKEN  Patient taking differently: Take 10 mEq by mouth daily.   Marykay Lex, MD Taking Active Self, Pharmacy Records  SYNTHROID 112 MCG tablet [id number] Yes Take 1 tablet (112 mcg total) by mouth daily before breakfast. Pincus Sanes, MD Taking Active Self, Pharmacy Records           Home Care and Equipment/Supplies: Were Home Health Services Ordered?: Yes Name of Home Health Agency:: [facility] PT/ OT Has Agency set up a time to come to your home?: Yes First Home Health Visit Date: 09/11/22 Any new equipment or medical supplies ordered?: No  Functional Questionnaire: Do you need assistance with bathing/showering or dressing?: No Do you need assistance with meal preparation?: No (daughter assists/ supervises as needed) Do you need assistance with eating?: No Do you have difficulty maintaining continence: No Do you need assistance with getting out of bed/getting out of a chair/moving?: No (daughter assists/ supervises as needed) Do you have difficulty managing or taking your medications?: No (daughter assists/ supervises as needed)  Follow up appointments reviewed: PCP Follow-up appointment confirmed?: Yes Date of PCP follow-up appointment?: 09/10/22 Follow-up Provider: PCP Specialist Hospital Follow-up appointment confirmed?: No Reason Specialist Follow-Up Not Confirmed: Patient has Specialist Provider Number and will Call for Appointment (advised to schedule with cardiology provider asap) Do you need transportation to your follow-up appointment?: No Do you understand care options if  your condition(s) worsen?: Yes-patient verbalized understanding  SDOH Interventions Today    Flowsheet Row Most Recent Value  SDOH Interventions   Food Insecurity Interventions Intervention Not Indicated  [currently active with [facility]]  Transportation Interventions Intervention Not Indicated  [daughter or son provides all transportation]      TOC Interventions Today    Flowsheet Row Most Recent Value  TOC Interventions   TOC Interventions Discussed/Reviewed TOC Interventions Discussed      Interventions Today    Flowsheet Row Most Recent Value  Chronic Disease   Chronic disease during today's visit Other  [GI bleeding with orthostatic hypotension]  General Interventions   General Interventions Discussed/Reviewed General Interventions Discussed, Communication with, Durable Medical Equipment (DME), Doctor Visits, Referral to Nurse  Doctor Visits Discussed/Reviewed Doctor Visits Discussed, PCP, Specialist  Durable Medical Equipment (DME) Val Riles with caregiver that patient uses assistive devices on regular basis, at baseline]  PCP/Specialist Visits Compliance with follow-up visit  Communication with PCP/Specialists, RN  Exercise Interventions   Exercise Discussed/Reviewed Exercise Discussed  [facility] health services for PT/ OT]  Education Interventions   Education Provided Provided Education  Provided Verbal Education On Medication, Other, When to see the doctor  [purpose of ACT/ need to obtain further instructions from care providers around use of ACT,  signs and symptoms GI bleeding with corresponding action plan]  Nutrition Interventions   Nutrition Discussed/Reviewed Nutrition Discussed  Pharmacy Interventions   Pharmacy Dicussed/Reviewed Pharmacy Topics Discussed  [Full medication review with updating medication list in EHR per patient report]  Safety Interventions   Safety Discussed/Reviewed Safety Discussed  Caryl Pina, RN, BSN, CCRN Alumnus RN CM  Care Coordination/ Transition of Care- [facility] Care Management [phone]: direct office

## 2022-09-11 ENCOUNTER — Encounter: Payer: Self-pay | Admitting: Internal Medicine

## 2022-09-11 ENCOUNTER — Telehealth: Payer: Self-pay | Admitting: Internal Medicine

## 2022-09-11 DIAGNOSIS — E785 Hyperlipidemia, unspecified: Secondary | ICD-10-CM | POA: Diagnosis not present

## 2022-09-11 DIAGNOSIS — I480 Paroxysmal atrial fibrillation: Secondary | ICD-10-CM | POA: Diagnosis not present

## 2022-09-11 DIAGNOSIS — K922 Gastrointestinal hemorrhage, unspecified: Secondary | ICD-10-CM | POA: Diagnosis not present

## 2022-09-11 DIAGNOSIS — E1122 Type 2 diabetes mellitus with diabetic chronic kidney disease: Secondary | ICD-10-CM | POA: Diagnosis not present

## 2022-09-11 DIAGNOSIS — Z7901 Long term (current) use of anticoagulants: Secondary | ICD-10-CM | POA: Diagnosis not present

## 2022-09-11 DIAGNOSIS — I5042 Chronic combined systolic (congestive) and diastolic (congestive) heart failure: Secondary | ICD-10-CM | POA: Diagnosis not present

## 2022-09-11 DIAGNOSIS — I2510 Atherosclerotic heart disease of native coronary artery without angina pectoris: Secondary | ICD-10-CM | POA: Diagnosis not present

## 2022-09-11 DIAGNOSIS — N183 Chronic kidney disease, stage 3 unspecified: Secondary | ICD-10-CM | POA: Diagnosis not present

## 2022-09-11 DIAGNOSIS — I442 Atrioventricular block, complete: Secondary | ICD-10-CM | POA: Diagnosis not present

## 2022-09-11 DIAGNOSIS — I130 Hypertensive heart and chronic kidney disease with heart failure and stage 1 through stage 4 chronic kidney disease, or unspecified chronic kidney disease: Secondary | ICD-10-CM | POA: Diagnosis not present

## 2022-09-11 NOTE — Telephone Encounter (Signed)
HH ORDERS   Caller Name: [facility] Agency Name: [facility] Callback Phone #: [phone](secure)  Service Requested: PT (examples: OT/PT/Skilled Nursing/Social Work/Speech Therapy/Wound Care)  Frequency of Visits: 2X a week for 4 weeks, 1X a week for 4 weeks   Vinnie Langton also wanted to report that the patient was told to re-start Eliquis and has not. She said it makes her nauseous. The patient was also told to stop taking Zofran.

## 2022-09-11 NOTE — Progress Notes (Unsigned)
Subjective:    Patient ID: Beverly Kim, female    DOB: 1924-06-12, 89 y.o.   MRN: [id number]     HPI Beverly Kim is here for follow up from the hospital.   Admitted 6/30-7/5  Went to the ED complaining of feeling ill.  Also had bloody stool 2 weeks ago-visited urgent care at that time and was prescribed Anusol which helped.  She did have abdominal pain and discomfort at that time.  She was Hemoccult positive.  Found to have anemia.  Started on PPI drip, Eliquis held.  Plan was to do colonoscopy.  Was fluid overloaded and required Lasix and bronchodilators.  Scope 7/3 showed hemorrhoids, AVM treated with APC.  She improved.  Hospital discharge delayed secondary to orthostatic hypotension which resolved after discontinuing losartan.  Continue to have episodes of shortness of breath.  There is no hypoxia or abnormal imaging-possible anxiety related.   Not taking eliquis due to nausea.  Also told to stop zofran  GI bleed-  Episodes of shortness of breath,?  Anxiety-   ? Palliative care    Medications and allergies reviewed with patient and updated if appropriate.  Current Outpatient Medications on File Prior to Visit  Medication Sig Dispense Refill   acetaminophen (TYLENOL) 650 MG CR tablet Take 650 mg by mouth every 8 (eight) hours as needed for pain.     albuterol (VENTOLIN HFA) 108 (90 Base) MCG/ACT inhaler Inhale 1-2 puffs into the lungs every 6 (six) hours as needed for wheezing or shortness of breath. 1 each 2   ANUCORT-HC 25 MG suppository Place 25 mg rectally 2 (two) times daily.     apixaban (ELIQUIS) 5 MG TABS tablet Take 1 tablet (5 mg total) by mouth 2 (two) times daily. (Patient not taking: Reported on 09/10/2022) 60 tablet 0   bisoprolol (ZEBETA) 5 MG tablet Take 0.5 tablets (2.5 mg total) by mouth daily. 45 tablet 2   furosemide (LASIX) 40 MG tablet TAKE 1 TABLET BY MOUTH EVERY DAY (Patient taking differently: Take 40 mg by mouth daily.) 90 tablet 3   metolazone  (ZAROXOLYN) 2.5 MG tablet TAKE 1 TABLET 3 TIMES WEEKLY (Monday,  Wednesday, Saturday) (Patient taking differently: Take 2.5 mg by mouth See admin instructions. TAKE 1 TABLET 3 TIMES WEEKLY (Monday,  Wednesday, Saturday)) 36 tablet 8   pantoprazole (PROTONIX) 40 MG tablet TAKE 1 TABLET BY MOUTH EVERY DAY 90 tablet 1   potassium chloride (KLOR-CON) 10 MEQ tablet TAKE 1 TABLET ( 10 MEQ total) BY MOUTH ONCE DAILY ON  DAYS  METOLAZONE  IS  TAKEN (Patient taking differently: Take 10 mEq by mouth daily.) 30 tablet 11   SYNTHROID 112 MCG tablet Take 1 tablet (112 mcg total) by mouth daily before breakfast. 90 tablet 3   No current facility-administered medications on file prior to visit.     Review of Systems     Objective:  There were no vitals filed for this visit. BP Readings from Last 3 Encounters:  09/07/22 (!) 106/58  05/08/22 124/78  01/02/22 138/60   Wt Readings from Last 3 Encounters:  09/07/22 159 lb 9.8 oz (72.4 kg)  05/08/22 151 lb (68.5 kg)  01/02/22 151 lb 6.4 oz (68.7 kg)   There is no height or weight on file to calculate BMI.    Physical Exam     Lab Results  Component Value Date   WBC 6.7 09/06/2022   HGB 9.0 (L) 09/06/2022   HCT 28.0 (L)  09/06/2022   PLT 145 (L) 09/06/2022   GLUCOSE 130 (H) 09/06/2022   CHOL 132 05/08/2022   TRIG 86.0 05/08/2022   HDL 39.50 05/08/2022   LDLDIRECT 163.3 01/30/2012   LDLCALC 75 05/08/2022   ALT 11 09/02/2022   AST 31 09/02/2022   NA 130 (L) 09/06/2022   K 3.8 09/06/2022   CL 99 09/06/2022   CREATININE 1.58 (H) 09/06/2022   BUN 30 (H) 09/06/2022   CO2 21 (L) 09/06/2022   TSH 5.034 (H) 09/03/2022   INR 1.5 (H) 09/02/2022   HGBA1C 6.7 (H) 05/08/2022   DG CHEST PORT 1 VIEW CLINICAL DATA:  Shortness of breath  EXAM: PORTABLE CHEST 1 VIEW  COMPARISON:  09/29/2022  FINDINGS: Calcified left upper lobe granuloma. Lungs otherwise clear. No frank interstitial edema. No pleural effusion or pneumothorax.  Cardiomegaly.  Thoracic aortic atherosclerosis. Left subclavian pacemaker.  IMPRESSION: Cardiomegaly.  No acute cardiopulmonary disease.  Electronically Signed   By: Charline Bills M.D.   On: 09/06/2022 21:21    Assessment & Plan:    See Problem List for Assessment and Plan of chronic medical problems.

## 2022-09-11 NOTE — Patient Instructions (Signed)
      Blood work was ordered.   The lab is on the first floor.    Medications changes include :       A referral was ordered and someone will call you to schedule an appointment.     No follow-ups on file.  

## 2022-09-11 NOTE — Telephone Encounter (Signed)
Okay for orders? 

## 2022-09-11 NOTE — Telephone Encounter (Signed)
Okay for orders.  Noted on other reports - she has f/u tomorrow and I can address

## 2022-09-11 NOTE — Telephone Encounter (Signed)
Spoke with Vinnie Langton and info given.

## 2022-09-11 NOTE — Telephone Encounter (Signed)
HH ORDERS   Caller Name: [facility] Agency Name: [facility] Callback Phone #: [phone](secure)  Service Requested: OT (examples: OT/PT/Skilled Nursing/Social Work/Speech Therapy/Wound Care)  Frequency of Visits: 1 week 1, 2 week 3

## 2022-09-11 NOTE — Telephone Encounter (Signed)
Verbals given to Will today. 

## 2022-09-12 ENCOUNTER — Telehealth: Payer: Self-pay

## 2022-09-12 ENCOUNTER — Ambulatory Visit (INDEPENDENT_AMBULATORY_CARE_PROVIDER_SITE_OTHER): Payer: PPO | Admitting: Internal Medicine

## 2022-09-12 ENCOUNTER — Telehealth: Payer: Self-pay | Admitting: Internal Medicine

## 2022-09-12 VITALS — BP 130/72 | HR 60 | Temp 98.0°F | Ht 62.0 in | Wt 156.0 lb

## 2022-09-12 DIAGNOSIS — G4700 Insomnia, unspecified: Secondary | ICD-10-CM | POA: Insufficient documentation

## 2022-09-12 DIAGNOSIS — E038 Other specified hypothyroidism: Secondary | ICD-10-CM

## 2022-09-12 DIAGNOSIS — R110 Nausea: Secondary | ICD-10-CM

## 2022-09-12 DIAGNOSIS — K922 Gastrointestinal hemorrhage, unspecified: Secondary | ICD-10-CM

## 2022-09-12 DIAGNOSIS — I5042 Chronic combined systolic (congestive) and diastolic (congestive) heart failure: Secondary | ICD-10-CM

## 2022-09-12 DIAGNOSIS — I480 Paroxysmal atrial fibrillation: Secondary | ICD-10-CM

## 2022-09-12 DIAGNOSIS — Z7189 Other specified counseling: Secondary | ICD-10-CM | POA: Diagnosis not present

## 2022-09-12 DIAGNOSIS — G4709 Other insomnia: Secondary | ICD-10-CM

## 2022-09-12 DIAGNOSIS — I10 Essential (primary) hypertension: Secondary | ICD-10-CM

## 2022-09-12 MED ORDER — TRAZODONE HCL 50 MG PO TABS: 25.0000 mg | ORAL_TABLET | Freq: Every day | ORAL | 5 refills | Status: DC

## 2022-09-12 MED ORDER — ONDANSETRON HCL 4 MG PO TABS: 4.0000 mg | ORAL_TABLET | Freq: Three times a day (TID) | ORAL | 2 refills | Status: DC | PRN

## 2022-09-12 NOTE — Assessment & Plan Note (Signed)
Referral to palliative care She did briefly meet while in the hospital.  I think this would benefit her greatly limiting her daughter agrees  Discussed end-of-life care briefly-DNR, DNI, IVF, tube feeding.  Also discussed DNR form for home.  Her and her daughter will think about this and discuss further with palliative care

## 2022-09-12 NOTE — Assessment & Plan Note (Signed)
Chronic Has had nausea for a while No obvious cause Will restart Zofran since this was helping and she is miserable now-Zofran 4 mg 3 times daily as needed ?  Pantoprazole causing nausea-asked her daughter to hold this for couple days to see if there is any improvement She does need something for reflux so we will either need to restart it if it is not causing the nausea or find a different medication if it is

## 2022-09-12 NOTE — Assessment & Plan Note (Signed)
Chronic Blood pressure controlled Continue bisoprolol 2.5 mg daily Spironolactone and losartan discontinued in the hospital

## 2022-09-12 NOTE — Assessment & Plan Note (Addendum)
Chronic Following with cardiology On bisoprolol 2.5 mg daily Recent GI bleed and Eliquis was discontinued, but she was advised to restart She has not restarted this because it causes nausea Advised following up with cardiology

## 2022-09-12 NOTE — Assessment & Plan Note (Addendum)
History of GI bleed Recent admission for GI bleed-AVM seen on colonoscopy treated with APC Eliquis held temporarily and placed on PPI Eliquis restarted, but she has not taken because it causes nausea H/H stable prior to discharge Will hold off on CBC given bruising remaining from last blood draw and stability while in the hospital

## 2022-09-12 NOTE — Telephone Encounter (Signed)
Received call from pt's daughter, Beverly Kim concerning pt and her recent hospitalization. Pt was recently hospitalized (09/02/22 -09/07/22) d/t abdominal pain and discomfort. Hemoccult positive. Colonoscopy performed. Pt was recently switched from Warfarin to Eliquis on 07/25/22. Beverly Kim states since pt switched to Eliquis she has experienced "unbearable nausea and vomiting". Per discharge summary pt was instructed to restart Eliquis on 09/08/22 post hospitalization; however, pt is refusing to restart Eliquis. Beverly Kim states pt can switch back to Warfarin if needed but they would like Dr Elissa Hefty opinion if pt needs to be on any anticoagulation at this time. Beverly Kim states pt experiences nose bleeds, is fall risk and was recently hospitalized for bloody stool. Daughter also states pt now has a palliative care consult. Daughter would like someone to call her to let her know if restarting Warfarin is necessary as soon as possible.

## 2022-09-12 NOTE — Assessment & Plan Note (Signed)
Chronic Has leg swelling, but likely euvolemic I do not think her shortness of breath is fluid overload, but could be cardiac in nature or some anxiety.  Anemia could be contributing some Has some mild swelling on exam Continue furosemide 40 mg every day, metolazone 2.5 mg 3 times weekly

## 2022-09-12 NOTE — Telephone Encounter (Signed)
Noted  

## 2022-09-12 NOTE — Telephone Encounter (Signed)
[facility] called to notify that they have received the orders for palliative care and will be following.

## 2022-09-12 NOTE — Assessment & Plan Note (Signed)
Chronic Has difficulty falling asleep and staying asleep which is not new This is likely part of the reason she is not feeling well Start trazodone 25 mg nightly-can increase to 50 mg if not effective If this is not helpful can consider low-dose mirtazapine Hopefully improving her sleep will help her feel better and medication may help with some possible underlying anxiety

## 2022-09-12 NOTE — Assessment & Plan Note (Signed)
Chronic Lab Results  Component Value Date   TSH 5.034 (H) 09/03/2022  Continue Synthroid 112 mcg daily

## 2022-09-13 ENCOUNTER — Other Ambulatory Visit (HOSPITAL_COMMUNITY): Payer: Self-pay

## 2022-09-13 ENCOUNTER — Encounter: Payer: Self-pay | Admitting: Internal Medicine

## 2022-09-13 ENCOUNTER — Telehealth: Payer: Self-pay

## 2022-09-13 ENCOUNTER — Encounter: Payer: Self-pay | Admitting: Cardiology

## 2022-09-13 NOTE — Telephone Encounter (Signed)
Pharmacy Patient Advocate Encounter   Received notification from Physician's Office that prior authorization for Ondansetron 4mg  tabs is required/requested.   Insurance verification completed.   The patient is insured through  [employer]  .   Per test claim: prior auth required    PA started via CoverMyMeds. KEY [id number] . Waiting for clinical questions to populate.

## 2022-09-14 NOTE — Telephone Encounter (Signed)
Prior authorization questions have been answered and faxed to [phone]. Awaiting determination.

## 2022-09-15 NOTE — Telephone Encounter (Signed)
She just needs a hospital follow-up visit scheduled with APP.  They can talk about other options other than Eliquis.  Potentially Xarelto., Or going back to warfarin.

## 2022-09-16 NOTE — Telephone Encounter (Signed)
Question answered in Patient calls section -- need to have her seen for hospital f/u to discuss options. With recent GI Bleed - we need to discuss Pros-Cons of anticoagulation & further risk.  We could try Xarelto for a few weeks to see if tolerated.  Or just go back to Warfarin.  I have not been able to review the whole hospitalization & reasons for changing which would need to be done in a hospital f/u visit.    DH

## 2022-09-17 ENCOUNTER — Encounter: Payer: Self-pay | Admitting: Internal Medicine

## 2022-09-17 ENCOUNTER — Ambulatory Visit (INDEPENDENT_AMBULATORY_CARE_PROVIDER_SITE_OTHER): Payer: PPO

## 2022-09-17 DIAGNOSIS — I442 Atrioventricular block, complete: Secondary | ICD-10-CM | POA: Diagnosis not present

## 2022-09-17 LAB — CUP PACEART REMOTE DEVICE CHECK
Battery Remaining Longevity: 34 mo
Battery Remaining Percentage: 30 %
Battery Voltage: 2.93 V
Brady Statistic AP VP Percent: 66 %
Brady Statistic AP VS Percent: 0 %
Brady Statistic AS VP Percent: 34 %
Brady Statistic AS VS Percent: 0 %
Brady Statistic RA Percent Paced: 1 %
Brady Statistic RV Percent Paced: 97 %
Date Time Interrogation Session: 20240715053534
Implantable Lead Connection Status: 753985
Implantable Lead Connection Status: 753985
Implantable Lead Implant Date: 20180313
Implantable Lead Implant Date: 20180313
Implantable Lead Location: 753859
Implantable Lead Location: 753860
Implantable Pulse Generator Implant Date: 20180313
Lead Channel Impedance Value: 390 Ohm
Lead Channel Impedance Value: 400 Ohm
Lead Channel Pacing Threshold Amplitude: 1.125 V
Lead Channel Pacing Threshold Amplitude: 1.25 V
Lead Channel Pacing Threshold Pulse Width: 0.5 ms
Lead Channel Pacing Threshold Pulse Width: 0.5 ms
Lead Channel Sensing Intrinsic Amplitude: 1 mV
Lead Channel Sensing Intrinsic Amplitude: 5.7 mV
Lead Channel Setting Pacing Amplitude: 1.375
Lead Channel Setting Pacing Amplitude: 2.5 V
Lead Channel Setting Pacing Pulse Width: 0.5 ms
Lead Channel Setting Sensing Sensitivity: 4 mV
Pulse Gen Model: 2272
Pulse Gen Serial Number: 8005625

## 2022-09-17 MED ORDER — PROMETHAZINE HCL 12.5 MG PO TABS: 6.2500 mg | ORAL_TABLET | Freq: Four times a day (QID) | ORAL | 0 refills | Status: DC | PRN

## 2022-09-17 NOTE — Telephone Encounter (Signed)
Called and spoke with pt's daughter, [name]. Made her aware of Dr Elissa Hefty recommendation. Transferred to scheduling to schedule next available appt to discus anticoagulation.

## 2022-09-17 NOTE — Telephone Encounter (Signed)
Alternative sent in and patient notified via my-chart message.

## 2022-09-17 NOTE — Telephone Encounter (Signed)
Pharmacy Patient Advocate Encounter  Received notification from [name]/ [employer] [facility] that Prior Authorization for Ondansetron 4mg  has been DENIED because the requested drug must meet the following therapeuti criteia: diagnosis of chemotherapy or radiation induced nausea/vomiting or prophylaxis of post-surgical nausea/vomiting, before induction of anesthesia or shortly after surgery..     Please be advised we currently do not have a Pharmacist to review denials, therefore you will need to process appeals accordingly as needed. Thanks for your support at this time. Contact for appeals are as follows: Phone: [phone], Fax: [phone]  Denial letter indexed to chart

## 2022-09-18 ENCOUNTER — Encounter: Payer: Self-pay | Admitting: Internal Medicine

## 2022-09-18 DIAGNOSIS — R2681 Unsteadiness on feet: Secondary | ICD-10-CM

## 2022-09-18 DIAGNOSIS — M5136 Other intervertebral disc degeneration, lumbar region: Secondary | ICD-10-CM

## 2022-09-18 DIAGNOSIS — R2689 Other abnormalities of gait and mobility: Secondary | ICD-10-CM

## 2022-09-18 NOTE — Telephone Encounter (Signed)
Completed.

## 2022-09-19 ENCOUNTER — Other Ambulatory Visit: Payer: Self-pay

## 2022-09-19 NOTE — Progress Notes (Signed)
Error

## 2022-09-21 ENCOUNTER — Telehealth: Payer: Self-pay | Admitting: Internal Medicine

## 2022-09-21 DIAGNOSIS — I480 Paroxysmal atrial fibrillation: Secondary | ICD-10-CM | POA: Diagnosis not present

## 2022-09-21 DIAGNOSIS — K219 Gastro-esophageal reflux disease without esophagitis: Secondary | ICD-10-CM

## 2022-09-21 DIAGNOSIS — I442 Atrioventricular block, complete: Secondary | ICD-10-CM | POA: Diagnosis not present

## 2022-09-21 DIAGNOSIS — I2510 Atherosclerotic heart disease of native coronary artery without angina pectoris: Secondary | ICD-10-CM | POA: Diagnosis not present

## 2022-09-21 DIAGNOSIS — I5042 Chronic combined systolic (congestive) and diastolic (congestive) heart failure: Secondary | ICD-10-CM | POA: Diagnosis not present

## 2022-09-21 DIAGNOSIS — D62 Acute posthemorrhagic anemia: Secondary | ICD-10-CM

## 2022-09-21 DIAGNOSIS — Z7901 Long term (current) use of anticoagulants: Secondary | ICD-10-CM | POA: Diagnosis not present

## 2022-09-21 DIAGNOSIS — E1140 Type 2 diabetes mellitus with diabetic neuropathy, unspecified: Secondary | ICD-10-CM | POA: Diagnosis not present

## 2022-09-21 DIAGNOSIS — N183 Chronic kidney disease, stage 3 unspecified: Secondary | ICD-10-CM | POA: Diagnosis not present

## 2022-09-21 DIAGNOSIS — D6869 Other thrombophilia: Secondary | ICD-10-CM | POA: Diagnosis not present

## 2022-09-21 DIAGNOSIS — N3000 Acute cystitis without hematuria: Secondary | ICD-10-CM

## 2022-09-21 DIAGNOSIS — K625 Hemorrhage of anus and rectum: Secondary | ICD-10-CM | POA: Diagnosis not present

## 2022-09-21 DIAGNOSIS — E785 Hyperlipidemia, unspecified: Secondary | ICD-10-CM | POA: Diagnosis not present

## 2022-09-21 DIAGNOSIS — E039 Hypothyroidism, unspecified: Secondary | ICD-10-CM

## 2022-09-21 DIAGNOSIS — R300 Dysuria: Secondary | ICD-10-CM

## 2022-09-21 DIAGNOSIS — I130 Hypertensive heart and chronic kidney disease with heart failure and stage 1 through stage 4 chronic kidney disease, or unspecified chronic kidney disease: Secondary | ICD-10-CM | POA: Diagnosis not present

## 2022-09-21 DIAGNOSIS — E1122 Type 2 diabetes mellitus with diabetic chronic kidney disease: Secondary | ICD-10-CM | POA: Diagnosis not present

## 2022-09-21 MED ORDER — ONDANSETRON 4 MG PO TBDP: 4.0000 mg | ORAL_TABLET | Freq: Three times a day (TID) | ORAL | 0 refills | Status: DC | PRN

## 2022-09-21 NOTE — Telephone Encounter (Signed)
MD out of the office pls advise../lmb 

## 2022-09-21 NOTE — Telephone Encounter (Signed)
Erica from [facility] called stating Vitals are normal pt is having nausea. Pt daughter stated pt haven't urinated in a few with discomfort.

## 2022-09-21 NOTE — Telephone Encounter (Signed)
Ok for zofran prn - done erx  It is getting late in the afternoon, but I did place order for UA and culture if she can have this done    thanks

## 2022-09-21 NOTE — Telephone Encounter (Signed)
Called pt spoke w/daughter [name]. She states mom is not having any discomfort. She states mom is Stage 4 in Kidney failure, and not sure why she is not seeing a kidney doctor. She states mom have a appt next week w/ palliative care will have them to do UA if she need. Inform daughter will forward this email to Dr. Lawerance Bach desktop to review when she return on 7/29.Marland KitchenRaechel Chute

## 2022-09-24 NOTE — Telephone Encounter (Signed)
Called daughter no answer LMOM w/ [place], PA response...Raechel Chute

## 2022-09-25 ENCOUNTER — Ambulatory Visit: Payer: Self-pay

## 2022-09-25 NOTE — Patient Outreach (Signed)
  Care Coordination   Initial Visit Note   09/25/2022 Name: KELBY LOTSPEICH MRN: [id number] DOB: 1924/04/12  [name] is a 89 y.o. year old female who sees Burns, Bobette Mo, MD for primary care. I spoke with daughter, [name] Capps/dpr by phone today.  What matters to the patients health and wellness today?  Admitted 09/03/22-09/07/22. Spoke with [name] Capps/daughter dpr who assist with managing patient's health care needs. Ms. Lyndal Rainbow confirmed patient is active with home health, [facility]. Per Ms. Capps, Palliative care to make home visit this Thursday. Ms. Lyndal Rainbow reports it is getting a little difficult to take patient out to a lot of appointments.   She expresses several concerns that were messaged to PCP: 1) Ms. Gierke is still having problems with nausea- Promethazine is not working, the ondansetron was not approved by insurance and she would like something else prescribed. 2) Ms. Risden has Anucort on her medication profile. Per Ms. Capps, this was prescribed at an urgent care visit in [place] due to GI bleed in June. Ms. Lyndal Rainbow reports patient does not have anymore and if she needs to continue then will need a prescription. 3) Ms. Capps would like to know if patient needs to see a Kidney Specialist due to stage 4 kidney disease. 4) per Ms. Capps, Ms. Biswell has had daily nose bleeds off and on for several months. She reports it was bad at one time, then eased off, but is starting back up again.   Ms. Lyndal Rainbow to discuss anticoagulants with Dr. Herbie Baltimore, cardiologist and is awaiting a call from the scheduler.   Goals Addressed             This Visit's Progress    Assist with health management       Interventions Today    Flowsheet Row Most Recent Value  Chronic Disease   Chronic disease during today's visit Other  [GI bleeding with orthostatic hypotension]  General Interventions   General Interventions Discussed/Reviewed General Interventions Discussed, Doctor Visits, Communication with,  Community Resources  [confirmed Palliative care contact and visit scheduled for 09/27/22,  Briefly discussed [facility] home provider]  Doctor Visits Discussed/Reviewed PCP, Doctor Visits Discussed  Durable Medical Equipment (DME) Other  [rollator walker]  PCP/Specialist Visits Compliance with follow-up visit  Communication with PCP/Specialists  [regarding patient concerns. assist with scheduling cardiology appointment]  Exercise Interventions   Exercise Discussed/Reviewed Exercise Discussed  [confirmed active with home health therapy]  Education Interventions   Education Provided Provided Education  Provided Verbal Education On When to see the doctor, Exercise, Medication  [advised to contact provider if condition/nose bleed does not improve or worsens.]  Pharmacy Interventions   Pharmacy Dicussed/Reviewed Pharmacy Topics Discussed, Medications and their functions, Medication Adherence, Affording Medications  Safety Interventions   Safety Discussed/Reviewed Safety Discussed, Fall Risk, Home Safety  Home Safety Assistive Devices            SDOH assessments and interventions completed:  No  Care Coordination Interventions:  Yes, provided   Follow up plan: Follow up call scheduled for 10/18/22    Encounter Outcome:  Pt. Visit Completed   Kathyrn Sheriff, RN, MSN, BSN, CCM [facility] Care Coordinator [phone]

## 2022-09-25 NOTE — Patient Instructions (Signed)
Visit Information  Thank you for taking time to visit with me today. Please don't hesitate to contact me if I can be of assistance to you.   Following are the goals we discussed today:  Attend provider visits as scheduled Take medications as prescribed Contact provider if condition/nosebleed worsens or does not improve or with health questions or concerns as needed Work with home health therapist as recommended  Our next appointment is by telephone on 10/18/22 at 1:15pm  Please call the care guide team at [phone] if you need to cancel or reschedule your appointment.   If you are experiencing a Mental Health or Behavioral Health Crisis or need someone to talk to, please call the Suicide and Crisis Lifeline: 22   Kathyrn Sheriff, RN, MSN, BSN, CCM [facility] Care Coordinator [phone]

## 2022-09-27 MED ORDER — ANUCORT-HC 25 MG RE SUPP: 25.0000 mg | Freq: Two times a day (BID) | RECTAL | 0 refills | Status: DC | PRN

## 2022-10-03 ENCOUNTER — Emergency Department (HOSPITAL_COMMUNITY)
Admission: EM | Admit: 2022-10-03 | Discharge: 2022-10-03 | Disposition: A | Discharge status: Home / Self Care | Payer: PPO | Attending: Emergency Medicine | Admitting: Emergency Medicine

## 2022-10-03 ENCOUNTER — Other Ambulatory Visit: Payer: Self-pay

## 2022-10-03 ENCOUNTER — Emergency Department (HOSPITAL_COMMUNITY): Payer: PPO

## 2022-10-03 ENCOUNTER — Encounter (HOSPITAL_COMMUNITY): Payer: Self-pay

## 2022-10-03 DIAGNOSIS — I2510 Atherosclerotic heart disease of native coronary artery without angina pectoris: Secondary | ICD-10-CM | POA: Diagnosis not present

## 2022-10-03 DIAGNOSIS — I10 Essential (primary) hypertension: Secondary | ICD-10-CM | POA: Diagnosis not present

## 2022-10-03 DIAGNOSIS — K219 Gastro-esophageal reflux disease without esophagitis: Secondary | ICD-10-CM | POA: Diagnosis not present

## 2022-10-03 DIAGNOSIS — Z20822 Contact with and (suspected) exposure to covid-19: Secondary | ICD-10-CM | POA: Insufficient documentation

## 2022-10-03 DIAGNOSIS — I130 Hypertensive heart and chronic kidney disease with heart failure and stage 1 through stage 4 chronic kidney disease, or unspecified chronic kidney disease: Secondary | ICD-10-CM | POA: Insufficient documentation

## 2022-10-03 DIAGNOSIS — R4182 Altered mental status, unspecified: Secondary | ICD-10-CM | POA: Diagnosis not present

## 2022-10-03 DIAGNOSIS — E039 Hypothyroidism, unspecified: Secondary | ICD-10-CM | POA: Diagnosis not present

## 2022-10-03 DIAGNOSIS — G4700 Insomnia, unspecified: Secondary | ICD-10-CM | POA: Diagnosis present

## 2022-10-03 DIAGNOSIS — Z950 Presence of cardiac pacemaker: Secondary | ICD-10-CM | POA: Diagnosis not present

## 2022-10-03 DIAGNOSIS — R0602 Shortness of breath: Secondary | ICD-10-CM | POA: Diagnosis not present

## 2022-10-03 DIAGNOSIS — R39198 Other difficulties with micturition: Secondary | ICD-10-CM | POA: Diagnosis not present

## 2022-10-03 DIAGNOSIS — R0600 Dyspnea, unspecified: Secondary | ICD-10-CM | POA: Diagnosis present

## 2022-10-03 DIAGNOSIS — G4709 Other insomnia: Secondary | ICD-10-CM | POA: Diagnosis not present

## 2022-10-03 DIAGNOSIS — Z91040 Latex allergy status: Secondary | ICD-10-CM | POA: Diagnosis not present

## 2022-10-03 DIAGNOSIS — R0609 Other forms of dyspnea: Secondary | ICD-10-CM

## 2022-10-03 DIAGNOSIS — R059 Cough, unspecified: Secondary | ICD-10-CM | POA: Diagnosis not present

## 2022-10-03 DIAGNOSIS — I517 Cardiomegaly: Secondary | ICD-10-CM | POA: Diagnosis not present

## 2022-10-03 DIAGNOSIS — I5023 Acute on chronic systolic (congestive) heart failure: Secondary | ICD-10-CM | POA: Diagnosis not present

## 2022-10-03 DIAGNOSIS — K449 Diaphragmatic hernia without obstruction or gangrene: Secondary | ICD-10-CM | POA: Diagnosis not present

## 2022-10-03 DIAGNOSIS — N183 Chronic kidney disease, stage 3 unspecified: Secondary | ICD-10-CM | POA: Diagnosis not present

## 2022-10-03 DIAGNOSIS — I6782 Cerebral ischemia: Secondary | ICD-10-CM | POA: Diagnosis not present

## 2022-10-03 DIAGNOSIS — Z79899 Other long term (current) drug therapy: Secondary | ICD-10-CM | POA: Diagnosis not present

## 2022-10-03 DIAGNOSIS — I700 Atherosclerosis of aorta: Secondary | ICD-10-CM | POA: Diagnosis not present

## 2022-10-03 DIAGNOSIS — I509 Heart failure, unspecified: Secondary | ICD-10-CM | POA: Insufficient documentation

## 2022-10-03 DIAGNOSIS — I110 Hypertensive heart disease with heart failure: Secondary | ICD-10-CM | POA: Diagnosis not present

## 2022-10-03 DIAGNOSIS — Z7901 Long term (current) use of anticoagulants: Secondary | ICD-10-CM | POA: Diagnosis not present

## 2022-10-03 DIAGNOSIS — K5730 Diverticulosis of large intestine without perforation or abscess without bleeding: Secondary | ICD-10-CM | POA: Diagnosis not present

## 2022-10-03 DIAGNOSIS — N1831 Chronic kidney disease, stage 3a: Secondary | ICD-10-CM

## 2022-10-03 DIAGNOSIS — R1032 Left lower quadrant pain: Secondary | ICD-10-CM | POA: Diagnosis not present

## 2022-10-03 DIAGNOSIS — N189 Chronic kidney disease, unspecified: Secondary | ICD-10-CM | POA: Diagnosis not present

## 2022-10-03 DIAGNOSIS — I5042 Chronic combined systolic (congestive) and diastolic (congestive) heart failure: Secondary | ICD-10-CM | POA: Diagnosis not present

## 2022-10-03 DIAGNOSIS — R188 Other ascites: Secondary | ICD-10-CM | POA: Diagnosis not present

## 2022-10-03 DIAGNOSIS — E119 Type 2 diabetes mellitus without complications: Secondary | ICD-10-CM | POA: Diagnosis not present

## 2022-10-03 LAB — CBC WITH DIFFERENTIAL/PLATELET
Abs Immature Granulocytes: 0.02 10*3/uL (ref 0.00–0.07)
Basophils Absolute: 0 10*3/uL (ref 0.0–0.1)
Basophils Relative: 1 %
Eosinophils Absolute: 0.1 10*3/uL (ref 0.0–0.5)
Eosinophils Relative: 1 %
HCT: 35.1 % — ABNORMAL LOW (ref 36.0–46.0)
Hemoglobin: 11.5 g/dL — ABNORMAL LOW (ref 12.0–15.0)
Immature Granulocytes: 0 %
Lymphocytes Relative: 24 %
Lymphs Abs: 1.4 10*3/uL (ref 0.7–4.0)
MCH: 33 pg (ref 26.0–34.0)
MCHC: 32.8 g/dL (ref 30.0–36.0)
MCV: 100.6 fL — ABNORMAL HIGH (ref 80.0–100.0)
Monocytes Absolute: 0.5 10*3/uL (ref 0.1–1.0)
Monocytes Relative: 9 %
Neutro Abs: 3.9 10*3/uL (ref 1.7–7.7)
Neutrophils Relative %: 65 %
Platelets: 143 10*3/uL — ABNORMAL LOW (ref 150–400)
RBC: 3.49 MIL/uL — ABNORMAL LOW (ref 3.87–5.11)
RDW: 14.6 % (ref 11.5–15.5)
WBC: 6 10*3/uL (ref 4.0–10.5)
nRBC: 0 % (ref 0.0–0.2)

## 2022-10-03 LAB — SARS CORONAVIRUS 2 BY RT PCR: SARS Coronavirus 2 by RT PCR: NEGATIVE

## 2022-10-03 LAB — URINALYSIS, ROUTINE W REFLEX MICROSCOPIC
Bilirubin Urine: NEGATIVE
Glucose, UA: NEGATIVE mg/dL
Hgb urine dipstick: NEGATIVE
Ketones, ur: NEGATIVE mg/dL
Nitrite: NEGATIVE
Protein, ur: 30 mg/dL — AB
Specific Gravity, Urine: 1.01 (ref 1.005–1.030)
pH: 6 (ref 5.0–8.0)

## 2022-10-03 LAB — COMPREHENSIVE METABOLIC PANEL
ALT: 17 U/L (ref 0–44)
AST: 32 U/L (ref 15–41)
Albumin: 3.9 g/dL (ref 3.5–5.0)
Alkaline Phosphatase: 64 U/L (ref 38–126)
Anion gap: 14 (ref 5–15)
BUN: 31 mg/dL — ABNORMAL HIGH (ref 8–23)
CO2: 22 mmol/L (ref 22–32)
Calcium: 9.4 mg/dL (ref 8.9–10.3)
Chloride: 97 mmol/L — ABNORMAL LOW (ref 98–111)
Creatinine, Ser: 1.35 mg/dL — ABNORMAL HIGH (ref 0.44–1.00)
GFR, Estimated: 36 mL/min — ABNORMAL LOW (ref >60)
Glucose, Bld: 105 mg/dL — ABNORMAL HIGH (ref 70–99)
Potassium: 3.5 mmol/L (ref 3.5–5.1)
Sodium: 133 mmol/L — ABNORMAL LOW (ref 135–145)
Total Bilirubin: 1.4 mg/dL — ABNORMAL HIGH (ref 0.3–1.2)
Total Protein: 6.9 g/dL (ref 6.5–8.1)

## 2022-10-03 LAB — BRAIN NATRIURETIC PEPTIDE: B Natriuretic Peptide: 520 pg/mL — ABNORMAL HIGH (ref 0.0–100.0)

## 2022-10-03 LAB — TROPONIN I (HIGH SENSITIVITY)
Troponin I (High Sensitivity): 36 ng/L — ABNORMAL HIGH (ref <18)
Troponin I (High Sensitivity): 21 ng/L — ABNORMAL HIGH (ref <18)

## 2022-10-03 MED ORDER — FUROSEMIDE 10 MG/ML IJ SOLN
40.0000 mg | Freq: Once | INTRAMUSCULAR | Status: AC
  Administered 2022-10-03: 40 mg via INTRAVENOUS
  Filled 2022-10-03: qty 4

## 2022-10-03 MED ORDER — ACETAMINOPHEN 325 MG PO TABS
650.0000 mg | ORAL_TABLET | Freq: Once | ORAL | Status: AC
  Administered 2022-10-03: 650 mg via ORAL
  Filled 2022-10-03: qty 2

## 2022-10-03 NOTE — Consult Note (Addendum)
Hospitalist Consultation History and Physical    ANAIS MCGROARTY OMV:[id number] DOB: February 10, 1925 DOA: 10/03/2022  DOS: the patient was seen and examined on 10/03/2022  PCP: Pincus Sanes, MD   Patient coming from: Home  I have personally briefly reviewed patient's old medical records in [facility]  CC: multiple complaints HPI: 89 year old Caucasian female history of combined systolic/diastolic heart failure with an EF of 40 to 45%, history of permanent pacemaker, hypertension, CKD stage III yea baseline creatinine approximately 1.3-1.5, bilateral hearing loss, reflux, chronic dyspnea, chronic nausea, chronic cough, reflux, who presents to the ER today with a litany of complaints.  None of which are acute.  Patient apparently told her daughter that she was feeling confused which is new.  Patient was seen by her primary care doctor on September 12, 2022 for follow-up from her hospitalization.  At that time, when she was discharged, her spironolactone, losartan were discontinued due to orthostatic hypotension patient had in the hospital.  Since that time, the patient is continue to complain of chronic nausea, chronic dyspnea, chronic cough.  Patient was seen in follow-up by her PCP.  Her Protonix was stopped to see if this would help her nausea.  This is not.  Her family has not restarted her Protonix.  Patient has been coughing more.  Patient stated that she has not urinated in 2 days but is ready urinate 2-3 times in the ER.  Her bladder scan in the ER only showed 80 cc of urine.  Chest x-ray was negative for her acute cardiopulmonary disease.  She has a stable cardiomegaly.  CT head showed no acute infarction.  CT abdomen pelvis without contrast demonstrated mild abdominal and pelvic ascites.  Diverticulosis without acute diverticulitis.  BNP was elevated at 520 which is on par for baseline for the last year.  BUN of 31 and a creatinine of 1.35.  In August 2023, BUN of 34, creatinine  1.45.  Hemoglobin is increased to 11.5 g/dL.  When she was discharged at the beginning of July, it was 9.0 g/dL.  EKG shows rate controlled A-fib.  The daughter(laura Capps) states that the patient stopped taking Eliquis due to it making her nauseous.  Patient lives with her daughter.  Patient still manages her own medications.  Daughter states that palliative care came by the house last week and saw the patient.  After reviewing the patient's chart, talking with the patient via writing on paper, and talking with the patient's daughter Wendall Stade, I find new acute medical reason for her to warrant hospital admission.  Discussed this at length with the patient's daughter.  Advised the daughter to have the patient follow-up with Dr. Lawerance Bach.  Patient may need outpatient referral back to [facility] due to her chronic complaints of nausea.  Patient already has p.o. Phenergan at home.      ED Course: CXR negative for pulmonary edema. CT head negative for acute CVA. CT abd negative for acute intra-abd pathology  Review of Systems:  Review of Systems  Constitutional:  Positive for malaise/fatigue.  HENT:  Positive for hearing loss.   Respiratory:  Positive for cough.        Dyspnea no matter what she does. Can be lying down, sitting up, standing up.  Cardiovascular:  Positive for leg swelling.  Gastrointestinal:  Positive for constipation and nausea.  Musculoskeletal:  Positive for myalgias.  Psychiatric/Behavioral:  The patient is nervous/anxious.     Past Medical History:  Diagnosis Date  Acute blood loss anemia 09/04/2022   Cardiac pacemaker 05/2016   Sick sinus syndrome/complete heart block   Chronic heart failure with preserved ejection fraction (HFpEF) (HCC)    Colon polyps    Diverticulosis    Dyspnea    GERD (gastroesophageal reflux disease)    Guaiac positive stools 09/04/2022   Hearing loss of both ears    Hepatic cyst    Hiatal hernia    Hyperlipidemia     Hypertension    Hypothyroidism    Low back pain    Non-occlusive coronary artery disease 04/2001   Cardiac Cath 04/18/2001: 50 and 70% mid LAD after D1. = Nonischemic by Myoview.  Medical management.   Other left bundle branch block    PAF (paroxysmal atrial fibrillation) (HCC) 08/16/2016   observed on PPM interrogation, asymptomatic, chad2vasc score is 5.  Bisoprolol for rate control, Xarelto for anticoagulation.   Rectal bleeding 09/04/2022   URI (upper respiratory infection)     Past Surgical History:  Procedure Laterality Date   BIOPSY  09/05/2022   Procedure: BIOPSY;  Surgeon: Meridee Score Netty Starring., MD;  Location: [facility] ENDOSCOPY;  Service: Gastroenterology;;   CARDIOVERSION N/A 01/16/2019   Procedure: CARDIOVERSION;  Surgeon: Wendall Stade, MD;  Location: [facility] ENDOSCOPY;  Service: Cardiovascular;  Laterality: N/A;   CHOLECYSTECTOMY N/A 12/15/2019   Procedure: LAPAROSCOPIC CHOLECYSTECTOMY;  Surgeon: Almond Lint, MD;  Location: [facility] OR;  Service: General;  Laterality: N/A;   COLONOSCOPY     COLONOSCOPY N/A 09/05/2022   Procedure: COLONOSCOPY;  Surgeon: Lemar Lofty., MD;  Location: [facility] ENDOSCOPY;  Service: Gastroenterology;  Laterality: N/A;   ERCP N/A 07/21/2019   Procedure: ENDOSCOPIC RETROGRADE CHOLANGIOPANCREATOGRAPHY (ERCP);  Surgeon: Rachael Fee, MD;  Location: [facility] ENDOSCOPY;  Service: Endoscopy;  Laterality: N/A;   HERNIA REPAIR     HOT HEMOSTASIS N/A 09/05/2022   Procedure: HOT HEMOSTASIS (ARGON PLASMA COAGULATION/BICAP);  Surgeon: Lemar Lofty., MD;  Location: [facility] ENDOSCOPY;  Service: Gastroenterology;  Laterality: N/A;   LEFT HEART CATH AND CORONARY ANGIOGRAPHY  04/18/2001   Dr. Riley Kill: EF 60 to 65%.  2+ MR.  Upper LM takeoff with 90 degree bend just inside the ostium.  50-70% mid LAD after D1.  D1 is 30%.    Distal LAD wraps the apex and is free of disease.  LCx is mostly large OM branch.  RCA provides PDA and PL branches and Free of disease. -->  Presumably  evaluated by Myoview that was nonischemic, therefore no PCI performed.   NM MYOVIEW LTD  01/2012   Normal EF.  Normal study.  No ischemia or infarction.   PACEMAKER IMPLANT Left 05/15/2016   SJM Assirity MRI dual chamber PPM implanted by Dr Johney Frame for CHB   POLYPECTOMY  09/05/2022   Procedure: POLYPECTOMY;  Surgeon: Mansouraty, Netty Starring., MD;  Location: [facility] ENDOSCOPY;  Service: Gastroenterology;;   REMOVAL OF STONES  07/21/2019   Procedure: REMOVAL OF STONES;  Surgeon: Rachael Fee, MD;  Location: [facility] ENDOSCOPY;  Service: Endoscopy;;   SPHINCTEROTOMY  07/21/2019   Procedure: Dennison Mascot;  Surgeon: Rachael Fee, MD;  Location: [facility] ENDOSCOPY;  Service: Endoscopy;;   THYROIDECTOMY     TONSILLECTOMY AND ADENOIDECTOMY     TOTAL ABDOMINAL HYSTERECTOMY W/ BILATERAL SALPINGOOPHORECTOMY     TRANSTHORACIC ECHOCARDIOGRAM  06/27/2016   Moderate basal-septal LVH, EF 60 to 65%.  No R WMA.  GR 1 DD.  Mild aortic valve calcification.  No AS.  Trivial MR.--Indicated improved EF compared to prior study  in 2015.   TRANSTHORACIC ECHOCARDIOGRAM  02/22/2012   12/'13 -a) EF 50 and 55%.  Normal function.-Moderate MR.  Mild LA dilation.; b) 11/2015Echo for shortness of breath => reduced EF of 45 to 50%.  Mild LVH.  Inferior reversible HK.  GRII DD.  Moderate MR.   TRANSTHORACIC ECHOCARDIOGRAM  10/2019   EF 60 -65%.  No RWMA.  Severe concentric LVH.  Unable to assess diastolic function.  Mild biatrial dilation.  Moderate MR moderate TR, no AS   TRANSTHORACIC ECHOCARDIOGRAM  06/15/2021   EF 40 to 45%.  Paradoxical septal motion from pacemaker.  Mild concentric LVH.  Indeterminate diastolic pressure.  Severe biatrial enlargement would argue significant diastolic dysfunction.  Severe MR and severe TR. => EF down from 60 to 65%.     reports that she has never smoked. She has never used smokeless tobacco. She reports that she does not drink alcohol and does not use drugs.  Allergies  Allergen Reactions    Adhesive [Tape] Itching, Dermatitis, Rash and Other (See Comments)    Blisters and "skin bubbles"   Ace Inhibitors Cough   Atorvastatin Other (See Comments)    REACTION: Reaction not known   Clindamycin Other (See Comments)    Unknown   Codeine Other (See Comments)    hallucinations    Latex Other (See Comments)    blisters   Shellfish Allergy Other (See Comments)    "gallbladder attack"   Simvastatin Other (See Comments)    fatigue   Penicillins Rash    Family History  Problem Relation Age of Onset   Coronary artery disease Other        family hx of 1st degree relative <50   Diabetes Other        family hx of   Other Other        cardiovascular disorder family hx of   Other Other        neurological disorder family hx of   Other Other        respiratory disease family hx of   Heart attack Daughter    Heart Problems Other        all children   Sudden death Son    Heart disease Brother    Heart disease Sister    Colon cancer Neg Hx    Colon polyps Neg Hx     Prior to Admission medications   Medication Sig Start Date End Date Taking? Authorizing Provider  acetaminophen (TYLENOL) 650 MG CR tablet Take 650 mg by mouth every 8 (eight) hours as needed for pain.    [provider]  albuterol (VENTOLIN HFA) 108 (90 Base) MCG/ACT inhaler Inhale 1-2 puffs into the lungs every 6 (six) hours as needed for wheezing or shortness of breath. 05/31/22   Burns, Bobette Mo, MD  ANUCORT-HC 25 MG suppository Place 1 suppository (25 mg total) rectally 2 (two) times daily as needed for hemorrhoids or anal itching. 09/27/22   Crain, Whitney L, PA  apixaban (ELIQUIS) 5 MG TABS tablet Take 1 tablet (5 mg total) by mouth 2 (two) times daily. Patient not taking: Reported on 09/25/2022 09/08/22   [name], MD  bisoprolol (ZEBETA) 5 MG tablet Take 0.5 tablets (2.5 mg total) by mouth daily. 02/14/22   Marykay Lex, MD  furosemide (LASIX) 40 MG tablet TAKE 1 TABLET BY MOUTH EVERY  DAY Patient taking differently: Take 40 mg by mouth daily. 04/30/22   Marykay Lex, MD  metolazone (  ZAROXOLYN) 2.5 MG tablet TAKE 1 TABLET 3 TIMES WEEKLY (Monday,  Wednesday, Saturday) Patient taking differently: Take 2.5 mg by mouth See admin instructions. TAKE 1 TABLET 3 TIMES WEEKLY (Monday,  Wednesday, Saturday) 01/02/22   Marykay Lex, MD  ondansetron (ZOFRAN) 4 MG tablet Take 1 tablet (4 mg total) by mouth every 8 (eight) hours as needed for nausea or vomiting. Patient not taking: Reported on 09/25/2022 09/12/22   Pincus Sanes, MD  ondansetron (ZOFRAN-ODT) 4 MG disintegrating tablet Take 1 tablet (4 mg total) by mouth every 8 (eight) hours as needed for nausea or vomiting. 09/21/22   Corwin Levins, MD  pantoprazole (PROTONIX) 40 MG tablet TAKE 1 TABLET BY MOUTH EVERY DAY 06/04/22   Doree Albee, PA-C  potassium chloride (KLOR-CON) 10 MEQ tablet TAKE 1 TABLET ( 10 MEQ total) BY MOUTH ONCE DAILY ON  DAYS  METOLAZONE  IS  TAKEN Patient taking differently: Take 10 mEq by mouth daily. 10/25/21   Marykay Lex, MD  promethazine (PHENERGAN) 12.5 MG tablet Take 0.5 tablets (6.25 mg total) by mouth every 6 (six) hours as needed for nausea or vomiting. 09/17/22   Crain, Whitney L, PA  SYNTHROID 112 MCG tablet Take 1 tablet (112 mcg total) by mouth daily before breakfast. 05/09/22   Pincus Sanes, MD  traZODone (DESYREL) 50 MG tablet Take 0.5-1 tablets (25-50 mg total) by mouth at bedtime. 09/12/22   Pincus Sanes, MD    Physical Exam: Vitals:   10/03/22 2100 10/03/22 2130 10/03/22 2200 10/03/22 2230  BP: (!) 149/66 (!) 153/77 139/68 139/61  Pulse: 60 64 66 60  Resp: (!) 26 (!) 21 (!) 29 (!) 27  Temp:      TempSrc:      SpO2: 100% 98% 100% 97%  Weight:      Height:        Physical Exam Vitals and nursing note reviewed.  Constitutional:      General: She is not in acute distress.    Appearance: She is not toxic-appearing.  HENT:     Head: Normocephalic and atraumatic.     Nose:  Nose normal.  Cardiovascular:     Rate and Rhythm: Normal rate. Rhythm irregular.     Pulses: Normal pulses.  Pulmonary:     Effort: No respiratory distress.     Breath sounds: No wheezing or rales.  Abdominal:     General: Bowel sounds are normal. There is no distension.     Tenderness: There is no abdominal tenderness.  Musculoskeletal:     Right lower leg: 1+ Pitting Edema present.     Left lower leg: 1+ Pitting Edema present.     Right ankle: No swelling.     Left ankle: No swelling.     Right foot: No swelling.     Left foot: No swelling.  Skin:    General: Skin is warm and dry.     Capillary Refill: Capillary refill takes less than 2 seconds.  Neurological:     Mental Status: She is alert.     Comments: Extremely hard of hearing in both ears      Labs on Admission: I have personally reviewed following labs and imaging studies  CBC: Recent Labs  Lab 10/03/22 1712  WBC 6.0  NEUTROABS 3.9  HGB 11.5*  HCT 35.1*  MCV 100.6*  PLT 143*   Basic Metabolic Panel: Recent Labs  Lab 10/03/22 1712  NA 133*  K 3.5  CL 97*  CO2 22  GLUCOSE 105*  BUN 31*  CREATININE 1.35*  CALCIUM 9.4   GFR: Estimated Creatinine Clearance: 22.7 mL/min (A) (by C-G formula based on SCr of 1.35 mg/dL (H)). Liver Function Tests: Recent Labs  Lab 10/03/22 1712  AST 32  ALT 17  ALKPHOS 64  BILITOT 1.4*  PROT 6.9  ALBUMIN 3.9   Cardiac Enzymes: Recent Labs  Lab 10/03/22 1712 10/03/22 1902  TROPONINIHS 36* 21*   BNP (last 3 results) Recent Labs    09/02/22 2001 09/04/22 1114 10/03/22 1712  BNP 436.2* 297.0* 520.0*   Urine analysis:    Component Value Date/Time   COLORURINE YELLOW 10/03/2022 1847   APPEARANCEUR CLEAR 10/03/2022 1847   LABSPEC 1.010 10/03/2022 1847   PHURINE 6.0 10/03/2022 1847   GLUCOSEU NEGATIVE 10/03/2022 1847   HGBUR NEGATIVE 10/03/2022 1847   HGBUR 2+ 12/13/2008 1032   BILIRUBINUR NEGATIVE 10/03/2022 1847   BILIRUBINUR n 12/25/2010 1311    KETONESUR NEGATIVE 10/03/2022 1847   PROTEINUR 30 (A) 10/03/2022 1847   UROBILINOGEN 0.2 12/25/2010 1311   UROBILINOGEN 0.2 12/13/2008 1032   NITRITE NEGATIVE 10/03/2022 1847   LEUKOCYTESUR TRACE (A) 10/03/2022 1847    Radiological Exams on Admission: I have personally reviewed images CT ABDOMEN PELVIS WO CONTRAST  Result Date: 10/03/2022 CLINICAL DATA:  Left lower quadrant abdominal pain EXAM: CT ABDOMEN AND PELVIS WITHOUT CONTRAST TECHNIQUE: Multidetector CT imaging of the abdomen and pelvis was performed following the standard protocol without IV contrast. RADIATION DOSE REDUCTION: This exam was performed according to the departmental dose-optimization program which includes automated exposure control, adjustment of the mA and/or kV according to patient size and/or use of iterative reconstruction technique. COMPARISON:  06/01/2020 FINDINGS: Lower chest: Cardiomegaly without pericardial effusion. Trace right pleural effusion. Hepatobiliary: Cholecystectomy. Unremarkable unenhanced appearance of the liver. Pancreas: Unremarkable unenhanced appearance. Spleen: Unremarkable unenhanced appearance. Adrenals/Urinary Tract: No urinary tract calculi or obstructive uropathy within either kidney. The adrenals and bladder are stable. Stomach/Bowel: No bowel obstruction or ileus. The appendix is not identified. There is diverticulosis of the descending and sigmoid colon, with no evidence of acute diverticulitis. No bowel wall thickening or inflammatory change. Small hiatal hernia. Vascular/Lymphatic: Aortic atherosclerosis. No enlarged abdominal or pelvic lymph nodes. Reproductive: Status post hysterectomy. No adnexal masses. Other: Trace ascites within the lower abdomen and pelvis. No free intraperitoneal gas. No abdominal wall hernia. Musculoskeletal: No acute or destructive bony abnormalities. Reconstructed images demonstrate no additional findings. IMPRESSION: 1. Cardiomegaly, without pericardial effusion. 2.  Trace right pleural effusion. 3. Trace lower abdominal and pelvic ascites. 4. Distal colonic diverticulosis without evidence of acute diverticulitis. 5.  Aortic Atherosclerosis (ICD10-I70.0). Electronically Signed   By: Sharlet Salina M.D.   On: 10/03/2022 21:41   CT Head Wo Contrast  Result Date: 10/03/2022 CLINICAL DATA:  Mental status change, unknown cause EXAM: CT HEAD WITHOUT CONTRAST TECHNIQUE: Contiguous axial images were obtained from the base of the skull through the vertex without intravenous contrast. RADIATION DOSE REDUCTION: This exam was performed according to the departmental dose-optimization program which includes automated exposure control, adjustment of the mA and/or kV according to patient size and/or use of iterative reconstruction technique. COMPARISON:  09/16/19 CT head FINDINGS: Limitations: Motion degraded exam. Brain: No evidence of acute infarction, hemorrhage, hydrocephalus, extra-axial collection or mass lesion/mass effect. Sequela of mild chronic microvascular ischemic change. Vascular: No hyperdense vessel or unexpected calcification. Skull: Normal. Negative for fracture or focal lesion. Sinuses/Orbits: No middle ear or mastoid effusion. Paranasal sinuses  are clear. Bilateral lens replacement. Orbits are otherwise unremarkable. Other: None. IMPRESSION: Motion degraded exam. Within this limitation, no acute intracranial abnormality. Electronically Signed   By: Lorenza Cambridge M.D.   On: 10/03/2022 17:54   DG Chest Portable 1 View  Result Date: 10/03/2022 CLINICAL DATA:  Shortness of breath.  Chronic kidney disease. EXAM: PORTABLE CHEST 1 VIEW COMPARISON:  Radiographs 09/06/2022, 09/04/2022 and 05/04/2003. Abdominal CT 06/01/2020. FINDINGS: 1658 hours. Left subclavian pacemaker leads project over the right atrium and right ventricle, unchanged. Stable cardiomegaly and aortic atherosclerosis. Unchanged small calcified granuloma on the left. No evidence of edema, confluent airspace  disease, pleural effusion or pneumothorax. No acute osseous findings are evident. Stable posttraumatic deformity of the proximal left humerus. IMPRESSION: Stable cardiomegaly. No evidence of acute cardiopulmonary process. Electronically Signed   By: Carey Bullocks M.D.   On: 10/03/2022 17:40    EKG: My personal interpretation of EKG shows: afib     Assessment/Plan Active Problems:   Dyspnea   Chronic combined systolic and diastolic CHF (congestive heart failure) (HCC)   CKD stage 3a, GFR 45-59 ml/min (HCC)   Insomnia   GERD    Assessment and Plan: Dyspnea Chronic. No pulmonary edema on CXR. RA sats in ER were 99%. I think anxiety is playing a very big role in her perceived dyspnea.    Insomnia Chronic. Pt states she only wants to take 25 mg of trazodone.  CKD stage 3a, GFR 45-59 ml/min (HCC) Stable. Scr looks slightly better than 10-2021. She doesn't need HD at this point. Doubt nephrology would offer HD to someone who is 89 yo.  Chronic combined systolic and diastolic CHF (congestive heart failure) (HCC) Pt with stable +1 pitting LE edema(had this during PCP office visit 09-12-2022). Continue with lasix 40 mg qday and three times a week metolazone 2.5 mg.  GERD Restart protonix 40 mg since her nausea did not improve while off protonix. Her GERD while off protonix may be the reason why her cough has gotten worse in the last 2 weeks.  Admission status:  Not admitted ,  no acute medical reason for admission. Discussed with pt's dtr laura capps and with EDP.   Carollee Herter, DO [facility] 10/03/2022, 11:48 PM

## 2022-10-03 NOTE — Assessment & Plan Note (Addendum)
Chronic. No pulmonary edema on CXR. RA sats in ER were 99%. I think anxiety is playing a very big role in her perceived dyspnea.

## 2022-10-03 NOTE — ED Notes (Signed)
Shift report received, assumed care of patient at this time 

## 2022-10-03 NOTE — ED Provider Notes (Signed)
[place] EMERGENCY DEPARTMENT AT [facility] Provider Note   CSN: [id number] Arrival date & time: 10/03/22  1641     History  Chief Complaint  Patient presents with   Urinary Retention    Beverly Kim is a 89 y.o. female with a past medical history significant for hypertension, hyperlipidemia, hypothyroidism, GERD, pacemaker in place, paroxysmal A-fib not on any anticoagulants, nonocclusive CAD, complete heart block, diabetes, CKD stage III, RLS, chronic orthopnea and nausea who presents to the ED due to multiple complaints.  First patient admits to worsening shortness of breath.  Admits to orthopnea.  Also admits to lower extremity edema.  Per daughter [name]) who I spoke to on the phone she has been having a chronic cough and coughing up phlegm for the past few months.  No recent fever.  O2 levels have been normal at home per daughter.  Daughter also states patient has not urinated in 2 days.  Daughter notes that patient sometimes has difficulties urinating however, never this long.  Patient admits to suprapubic pain.  History of CKD stage III.  Daughter states that patient woke up this morning and said she felt "confused".  She told her daughter that she feels like she may have dementia.  No facial droop or unilateral weakness witnessed by daughter or OT that came to the house at [facility] today.  Daughter notes patient is typically "sharp as tack" and can fully answer questions.  Daughter did not witness any confusion or abnormalities to her baseline.  Daughter states patient is hard of hearing and typically communicates with a white board.  Daughter also concerned patient has had decreased p.o. intake over the past 2 days.  She notes that she did not eat any breakfast and only a little dinner last night which is atypical for patient.  History obtained from patient, daughter, and past medical records. No interpreter used during encounter.       Home Medications Prior to  Admission medications   Medication Sig Start Date End Date Taking? Authorizing Provider  acetaminophen (TYLENOL) 650 MG CR tablet Take 650 mg by mouth every 8 (eight) hours as needed for pain.    [provider]  albuterol (VENTOLIN HFA) 108 (90 Base) MCG/ACT inhaler Inhale 1-2 puffs into the lungs every 6 (six) hours as needed for wheezing or shortness of breath. 05/31/22   Burns, Bobette Mo, MD  ANUCORT-HC 25 MG suppository Place 1 suppository (25 mg total) rectally 2 (two) times daily as needed for hemorrhoids or anal itching. 09/27/22   Crain, Whitney L, PA  apixaban (ELIQUIS) 5 MG TABS tablet Take 1 tablet (5 mg total) by mouth 2 (two) times daily. Patient not taking: Reported on 09/25/2022 09/08/22   [name], MD  bisoprolol (ZEBETA) 5 MG tablet Take 0.5 tablets (2.5 mg total) by mouth daily. 02/14/22   Marykay Lex, MD  furosemide (LASIX) 40 MG tablet TAKE 1 TABLET BY MOUTH EVERY DAY Patient taking differently: Take 40 mg by mouth daily. 04/30/22   Marykay Lex, MD  metolazone (ZAROXOLYN) 2.5 MG tablet TAKE 1 TABLET 3 TIMES WEEKLY (Monday,  Wednesday, Saturday) Patient taking differently: Take 2.5 mg by mouth See admin instructions. TAKE 1 TABLET 3 TIMES WEEKLY (Monday,  Wednesday, Saturday) 01/02/22   Marykay Lex, MD  ondansetron (ZOFRAN) 4 MG tablet Take 1 tablet (4 mg total) by mouth every 8 (eight) hours as needed for nausea or vomiting. Patient not taking: Reported on 09/25/2022 09/12/22  Pincus Sanes, MD  ondansetron (ZOFRAN-ODT) 4 MG disintegrating tablet Take 1 tablet (4 mg total) by mouth every 8 (eight) hours as needed for nausea or vomiting. Patient not taking: Reported on 09/25/2022 09/21/22   Corwin Levins, MD  pantoprazole (PROTONIX) 40 MG tablet TAKE 1 TABLET BY MOUTH EVERY DAY 06/04/22   Doree Albee, PA-C  potassium chloride (KLOR-CON) 10 MEQ tablet TAKE 1 TABLET ( 10 MEQ total) BY MOUTH ONCE DAILY ON  DAYS  METOLAZONE  IS  TAKEN Patient taking  differently: Take 10 mEq by mouth daily. 10/25/21   Marykay Lex, MD  promethazine (PHENERGAN) 12.5 MG tablet Take 0.5 tablets (6.25 mg total) by mouth every 6 (six) hours as needed for nausea or vomiting. 09/17/22   Crain, Whitney L, PA  SYNTHROID 112 MCG tablet Take 1 tablet (112 mcg total) by mouth daily before breakfast. 05/09/22   Pincus Sanes, MD  traZODone (DESYREL) 50 MG tablet Take 0.5-1 tablets (25-50 mg total) by mouth at bedtime. 09/12/22   Pincus Sanes, MD      Allergies    Adhesive [tape], Ace inhibitors, Atorvastatin, Clindamycin, Codeine, Latex, Shellfish allergy, Simvastatin, and Penicillins    Review of Systems   Review of Systems  Constitutional:  Positive for appetite change. Negative for fever.  Respiratory:  Positive for cough and shortness of breath.   Cardiovascular:  Negative for chest pain.  Gastrointestinal:  Positive for abdominal pain (suprapubic pain).  Genitourinary:  Positive for difficulty urinating.    Physical Exam Updated Vital Signs BP (!) 153/77   Pulse 64   Temp 97.8 F (36.6 C) (Oral)   Resp (!) 21   Ht 5\' 2"  (1.575 m)   Wt 72 kg   SpO2 98%   BMI 29.03 kg/m  Physical Exam Vitals and nursing note reviewed.  Constitutional:      General: She is not in acute distress.    Appearance: She is not ill-appearing.  HENT:     Head: Normocephalic.  Eyes:     Pupils: Pupils are equal, round, and reactive to light.  Cardiovascular:     Rate and Rhythm: Normal rate and regular rhythm.     Pulses: Normal pulses.     Heart sounds: Normal heart sounds. No murmur heard.    No friction rub. No gallop.  Pulmonary:     Effort: Pulmonary effort is normal.     Breath sounds: Normal breath sounds.  Abdominal:     General: Abdomen is flat. There is no distension.     Palpations: Abdomen is soft.     Tenderness: There is abdominal tenderness. There is no guarding or rebound.     Comments: Suprapubic tenderness  Musculoskeletal:        General:  Normal range of motion.     Cervical back: Neck supple.     Comments: 2+ pitting edema bilaterally  Skin:    General: Skin is warm and dry.  Neurological:     General: No focal deficit present.     Mental Status: She is alert.  Psychiatric:        Mood and Affect: Mood normal.        Behavior: Behavior normal.     ED Results / Procedures / Treatments   Labs (all labs ordered are listed, but only abnormal results are displayed) Labs Reviewed  CBC WITH DIFFERENTIAL/PLATELET - Abnormal; Notable for the following components:      Result Value  RBC 3.49 (*)    Hemoglobin 11.5 (*)    HCT 35.1 (*)    MCV 100.6 (*)    Platelets 143 (*)    All other components within normal limits  COMPREHENSIVE METABOLIC PANEL - Abnormal; Notable for the following components:   Sodium 133 (*)    Chloride 97 (*)    Glucose, Bld 105 (*)    BUN 31 (*)    Creatinine, Ser 1.35 (*)    Total Bilirubin 1.4 (*)    GFR, Estimated 36 (*)    All other components within normal limits  URINALYSIS, ROUTINE W REFLEX MICROSCOPIC - Abnormal; Notable for the following components:   Protein, ur 30 (*)    Leukocytes,Ua TRACE (*)    Bacteria, UA RARE (*)    All other components within normal limits  BRAIN NATRIURETIC PEPTIDE - Abnormal; Notable for the following components:   B Natriuretic Peptide 520.0 (*)    All other components within normal limits  TROPONIN I (HIGH SENSITIVITY) - Abnormal; Notable for the following components:   Troponin I (High Sensitivity) 36 (*)    All other components within normal limits  TROPONIN I (HIGH SENSITIVITY) - Abnormal; Notable for the following components:   Troponin I (High Sensitivity) 21 (*)    All other components within normal limits  SARS CORONAVIRUS 2 BY RT PCR  URINE CULTURE    EKG EKG Interpretation Date/Time:  Wednesday October 03 2022 17:21:33 EDT Ventricular Rate:  62 PR Interval:    QRS Duration:  165 QT Interval:  565 QTC Calculation: 574 R  Axis:   -88  Text Interpretation: Atrial fibrillation Nonspecific IVCD with LAD LVH with secondary repolarization abnormality  similar to June 2024 Confirmed by Pricilla Loveless [id number]) on 10/03/2022 6:40:23 PM  Radiology CT ABDOMEN PELVIS WO CONTRAST  Result Date: 10/03/2022 CLINICAL DATA:  Left lower quadrant abdominal pain EXAM: CT ABDOMEN AND PELVIS WITHOUT CONTRAST TECHNIQUE: Multidetector CT imaging of the abdomen and pelvis was performed following the standard protocol without IV contrast. RADIATION DOSE REDUCTION: This exam was performed according to the departmental dose-optimization program which includes automated exposure control, adjustment of the mA and/or kV according to patient size and/or use of iterative reconstruction technique. COMPARISON:  06/01/2020 FINDINGS: Lower chest: Cardiomegaly without pericardial effusion. Trace right pleural effusion. Hepatobiliary: Cholecystectomy. Unremarkable unenhanced appearance of the liver. Pancreas: Unremarkable unenhanced appearance. Spleen: Unremarkable unenhanced appearance. Adrenals/Urinary Tract: No urinary tract calculi or obstructive uropathy within either kidney. The adrenals and bladder are stable. Stomach/Bowel: No bowel obstruction or ileus. The appendix is not identified. There is diverticulosis of the descending and sigmoid colon, with no evidence of acute diverticulitis. No bowel wall thickening or inflammatory change. Small hiatal hernia. Vascular/Lymphatic: Aortic atherosclerosis. No enlarged abdominal or pelvic lymph nodes. Reproductive: Status post hysterectomy. No adnexal masses. Other: Trace ascites within the lower abdomen and pelvis. No free intraperitoneal gas. No abdominal wall hernia. Musculoskeletal: No acute or destructive bony abnormalities. Reconstructed images demonstrate no additional findings. IMPRESSION: 1. Cardiomegaly, without pericardial effusion. 2. Trace right pleural effusion. 3. Trace lower abdominal and pelvic  ascites. 4. Distal colonic diverticulosis without evidence of acute diverticulitis. 5.  Aortic Atherosclerosis (ICD10-I70.0). Electronically Signed   By: Sharlet Salina M.D.   On: 10/03/2022 21:41   CT Head Wo Contrast  Result Date: 10/03/2022 CLINICAL DATA:  Mental status change, unknown cause EXAM: CT HEAD WITHOUT CONTRAST TECHNIQUE: Contiguous axial images were obtained from the base of the skull through the vertex without  intravenous contrast. RADIATION DOSE REDUCTION: This exam was performed according to the departmental dose-optimization program which includes automated exposure control, adjustment of the mA and/or kV according to patient size and/or use of iterative reconstruction technique. COMPARISON:  09/16/19 CT head FINDINGS: Limitations: Motion degraded exam. Brain: No evidence of acute infarction, hemorrhage, hydrocephalus, extra-axial collection or mass lesion/mass effect. Sequela of mild chronic microvascular ischemic change. Vascular: No hyperdense vessel or unexpected calcification. Skull: Normal. Negative for fracture or focal lesion. Sinuses/Orbits: No middle ear or mastoid effusion. Paranasal sinuses are clear. Bilateral lens replacement. Orbits are otherwise unremarkable. Other: None. IMPRESSION: Motion degraded exam. Within this limitation, no acute intracranial abnormality. Electronically Signed   By: Lorenza Cambridge M.D.   On: 10/03/2022 17:54   DG Chest Portable 1 View  Result Date: 10/03/2022 CLINICAL DATA:  Shortness of breath.  Chronic kidney disease. EXAM: PORTABLE CHEST 1 VIEW COMPARISON:  Radiographs 09/06/2022, 09/04/2022 and 05/04/2003. Abdominal CT 06/01/2020. FINDINGS: 1658 hours. Left subclavian pacemaker leads project over the right atrium and right ventricle, unchanged. Stable cardiomegaly and aortic atherosclerosis. Unchanged small calcified granuloma on the left. No evidence of edema, confluent airspace disease, pleural effusion or pneumothorax. No acute osseous findings  are evident. Stable posttraumatic deformity of the proximal left humerus. IMPRESSION: Stable cardiomegaly. No evidence of acute cardiopulmonary process. Electronically Signed   By: Carey Bullocks M.D.   On: 10/03/2022 17:40    Procedures Procedures    Medications Ordered in ED Medications  furosemide (LASIX) injection 40 mg (40 mg Intravenous Given 10/03/22 1859)  acetaminophen (TYLENOL) tablet 650 mg (650 mg Oral Given 10/03/22 2113)    ED Course/ Medical Decision Making/ A&P Clinical Course as of 10/03/22 2156  Wed Oct 03, 2022  1931 SARS Coronavirus 2 by RT PCR: NEGATIVE [CA]    Clinical Course User Index [CA] Mannie Stabile, PA-C                                 Medical Decision Making Amount and/or Complexity of Data Reviewed Independent Historian: caregiver    Details: Spoke to daughter, [name] on the phone Labs: ordered. Decision-making details documented in ED Course. Radiology: ordered and independent interpretation performed. Decision-making details documented in ED Course.  Risk OTC drugs. Prescription drug management. Decision regarding hospitalization.   This patient presents to the ED for concern of SOB, urinary retention, confusion, this involves an extensive number of treatment options, and is a complaint that carries with it a high risk of complications and morbidity.  The differential diagnosis includes CHF, ACS, PE, PNA, UTI, etc  89 year old female presents to the ED due to multiple complaints of shortness of breath, orthopnea, confusion, and difficulties urinating.  Patient currently resides with daughter, Donnald Garre who I spoke to on the phone.  History of A-fib not currently on any anticoagulants.  Upon arrival patient afebrile, not tachycardic or hypoxic.  2+ pitting edema to bilateral lower extremities.  Suprapubic tenderness.  Routine labs ordered.  COVID to rule out infection.  Chest x-ray to rule out evidence of pneumonia.  BNP to rule out CHF  exacerbation.  Troponin to rule out ACS.  Will Bladder Scan. CT head due to confusion. Per chart review, it appears patient has chronic dyspnea and orthopnea. Bladder scan performed with 85mL urine in bladder.   CBC significant for anemia with hemoglobin 11.5.  No leukocytosis.  CMP significant for elevated creatinine 1.35 and BUN at 31  which appears to be around patient's baseline.  Hyponatremia 133.  BNP elevated at 520.  Troponin elevated likely demand ischemia.  IV Lasix given.  Patient does appear fluid overloaded on exam.  Chest x-ray personally reviewed and interpreted which demonstrates stable cardiomegaly.  No signs of pneumonia.  CT head negative for any acute abnormalities.  COVID-negative.  UA significant for trace leukocytes and rare bacteria.  Low suspicion for acute cystitis. Patient able to urinate twice in the ED without difficulty. Low suspicion for urinary retention.   Upon reassessment, patient complaining of bilateral lower extremity pain. Son at bedside. Tylenol given.  Patient placed on nasal cannula for comfort.  Turned off nasal cannula.  O2 sat saturation remained at 100% with good waveform.  Possible CHF exacerbation causing worsening shortness of breath and orthopnea.  Low suspicion for PE. Patient will require admission for further treatment of SOB.   CT abdomen demonstrates cardiomegaly, trace right pleural effusion, trace lower abdominal and pelvic ascites.  Discussed with Dr. Criss Alvine who agrees with assessment and plan.   Discussed with Dr. Imogene Burn with [facility] who agrees to admit patient       Final Clinical Impression(s) / ED Diagnoses Final diagnoses:  Acute on chronic congestive heart failure, unspecified heart failure type [facility])    Rx / DC Orders ED Discharge Orders     None         Jesusita Oka 10/03/22 2156    Pricilla Loveless, MD 10/08/22 1810

## 2022-10-03 NOTE — ED Triage Notes (Signed)
Pt has recent Dx of stage 4 CKD so family is concerned for urinary retention, the family is also concerned with some shortness of breath as well. Pt is very hard of hearing and communicates well with pen and paper. Pt also has the complaints of SHOB but spo2 and rr is within normal limits. Pt also states last time she urinated was 2 days.   Medic vitals   156/88 64hr 22rr  98%ra 122bgl

## 2022-10-03 NOTE — Subjective & Objective (Addendum)
CC: multiple complaints HPI: 89 year old Caucasian female history of combined systolic/diastolic heart failure with an EF of 40 to 45%, history of permanent pacemaker, hypertension, CKD stage III yea baseline creatinine approximately 1.3-1.5, bilateral hearing loss, reflux, chronic dyspnea, chronic nausea, chronic cough, reflux, who presents to the ER today with a litany of complaints.  None of which are acute.  Patient apparently told her daughter that she was feeling confused which is new.  Patient was seen by her primary care doctor on September 12, 2022 for follow-up from her hospitalization.  At that time, when she was discharged, her spironolactone, losartan were discontinued due to orthostatic hypotension patient had in the hospital.  Since that time, the patient is continue to complain of chronic nausea, chronic dyspnea, chronic cough.  Patient was seen in follow-up by her PCP.  Her Protonix was stopped to see if this would help her nausea.  This is not.  Her family has not restarted her Protonix.  Patient has been coughing more.  Patient stated that she has not urinated in 2 days but is ready urinate 2-3 times in the ER.  Her bladder scan in the ER only showed 80 cc of urine.  Chest x-ray was negative for her acute cardiopulmonary disease.  She has a stable cardiomegaly.  CT head showed no acute infarction.  CT abdomen pelvis without contrast demonstrated mild abdominal and pelvic ascites.  Diverticulosis without acute diverticulitis.  BNP was elevated at 520 which is on par for baseline for the last year.  BUN of 31 and a creatinine of 1.35.  In August 2023, BUN of 34, creatinine 1.45.  Hemoglobin is increased to 11.5 g/dL.  When she was discharged at the beginning of July, it was 9.0 g/dL.  EKG shows rate controlled A-fib.  The daughter(laura Capps) states that the patient stopped taking Eliquis due to it making her nauseous.  Patient lives with her daughter.  Patient still manages  her own medications.  Daughter states that palliative care came by the house last week and saw the patient.  After reviewing the patient's chart, talking with the patient via writing on paper, and talking with the patient's daughter Wendall Stade, I find new acute medical reason for her to warrant hospital admission.  Discussed this at length with the patient's daughter.  Advised the daughter to have the patient follow-up with Dr. Lawerance Bach.  Patient may need outpatient referral back to [facility] due to her chronic complaints of nausea.  Patient already has p.o. Phenergan at home.

## 2022-10-03 NOTE — Assessment & Plan Note (Signed)
Chronic. Pt states she only wants to take 25 mg of trazodone.

## 2022-10-03 NOTE — ED Notes (Signed)
Called lab to add urine culture to existing specimen.

## 2022-10-03 NOTE — ED Notes (Signed)
Patient transported to CT 

## 2022-10-03 NOTE — Assessment & Plan Note (Signed)
Pt with stable +1 pitting LE edema(had this during PCP office visit 09-12-2022). Continue with lasix 40 mg qday and three times a week metolazone 2.5 mg.

## 2022-10-03 NOTE — ED Notes (Signed)
Called son to pick up patient.

## 2022-10-03 NOTE — Assessment & Plan Note (Signed)
Stable. Scr looks slightly better than 10-2021. She doesn't need HD at this point. Doubt nephrology would offer HD to someone who is 89 yo.

## 2022-10-03 NOTE — Discharge Instructions (Signed)
Follow-up with your primary care physician.  If you develop new or worsening shortness of breath, chest pain, fevers, cough, or any other new/concerning symptoms then return to the ER or call 911.

## 2022-10-03 NOTE — Assessment & Plan Note (Signed)
Restart protonix 40 mg since her nausea did not improve while off protonix. Her GERD while off protonix may be the reason why her cough has gotten worse in the last 2 weeks.

## 2022-10-03 NOTE — ED Notes (Signed)
Pt in room stating that. "I'm just dying and no one wants to help me." Pt is very hard of hearing and have been communicating by writing. Advised patient we are waiting on test results so we know how to help.

## 2022-10-03 NOTE — ED Notes (Signed)
Dressed patient in home clothes, prepped patient for discharge. Waiting for son to pick patient up.

## 2022-10-03 NOTE — Progress Notes (Signed)
Remote pacemaker transmission.   

## 2022-10-05 ENCOUNTER — Telehealth (HOSPITAL_BASED_OUTPATIENT_CLINIC_OR_DEPARTMENT_OTHER): Payer: Self-pay | Admitting: *Deleted

## 2022-10-05 ENCOUNTER — Other Ambulatory Visit: Payer: Self-pay | Admitting: Internal Medicine

## 2022-10-05 NOTE — Telephone Encounter (Signed)
Post ED Visit - Positive Culture Follow-up  Culture report reviewed by antimicrobial stewardship pharmacist: [facility] [x]  [place], [place].D. []  Celedonio Miyamoto, Pharm.D., BCPS AQ-ID []  Garvin Fila, Pharm.D., BCPS []  Georgina Pillion, Pharm.D., BCPS []  [place], [address].D., BCPS, AAHIVP []  Estella Husk, Pharm.D., BCPS, AAHIVP []  Lysle Pearl, PharmD, BCPS []  Phillips Climes, PharmD, BCPS []  Agapito Games, PharmD, BCPS []  Verlan Friends, PharmD []  Mervyn Gay, PharmD, BCPS []  Vinnie Level, PharmD  [facility] []  Len Childs, PharmD []  Greer Pickerel, PharmD []  Adalberto Cole, PharmD []  Perlie Gold, Rph []  Lonell Face) Jean Rosenthal, PharmD []  Earl Many, PharmD []  Junita Push, PharmD []  Dorna Leitz, PharmD []  Terrilee Files, PharmD []  Lynann Beaver, PharmD []  Keturah Barre, PharmD []  Loralee Pacas, PharmD []  Bernadene Person, PharmD   Positive urine culture No antibiotics needed and no further patient follow-up is required at this time.  [facility] 10/05/2022, 8:23 AM

## 2022-10-09 ENCOUNTER — Inpatient Hospital Stay (HOSPITAL_COMMUNITY): Payer: PPO | Admitting: Anesthesiology

## 2022-10-09 ENCOUNTER — Emergency Department (HOSPITAL_COMMUNITY): Payer: PPO

## 2022-10-09 ENCOUNTER — Inpatient Hospital Stay (HOSPITAL_COMMUNITY)
Admission: EM | Admit: 2022-10-09 | Discharge: 2022-10-16 | DRG: 522 | Disposition: A | Discharge status: Skilled Nursing Facility | Payer: PPO | Attending: Internal Medicine | Admitting: Internal Medicine

## 2022-10-09 ENCOUNTER — Encounter (HOSPITAL_COMMUNITY): Payer: Self-pay

## 2022-10-09 ENCOUNTER — Other Ambulatory Visit: Payer: Self-pay

## 2022-10-09 DIAGNOSIS — Z885 Allergy status to narcotic agent status: Secondary | ICD-10-CM

## 2022-10-09 DIAGNOSIS — S72031A Displaced midcervical fracture of right femur, initial encounter for closed fracture: Secondary | ICD-10-CM | POA: Diagnosis not present

## 2022-10-09 DIAGNOSIS — Z79890 Hormone replacement therapy (postmenopausal): Secondary | ICD-10-CM

## 2022-10-09 DIAGNOSIS — E890 Postprocedural hypothyroidism: Secondary | ICD-10-CM | POA: Diagnosis present

## 2022-10-09 DIAGNOSIS — R531 Weakness: Secondary | ICD-10-CM | POA: Diagnosis not present

## 2022-10-09 DIAGNOSIS — Z8679 Personal history of other diseases of the circulatory system: Secondary | ICD-10-CM

## 2022-10-09 DIAGNOSIS — Z950 Presence of cardiac pacemaker: Secondary | ICD-10-CM

## 2022-10-09 DIAGNOSIS — F0392 Unspecified dementia, unspecified severity, with psychotic disturbance: Secondary | ICD-10-CM | POA: Diagnosis not present

## 2022-10-09 DIAGNOSIS — I130 Hypertensive heart and chronic kidney disease with heart failure and stage 1 through stage 4 chronic kidney disease, or unspecified chronic kidney disease: Secondary | ICD-10-CM | POA: Diagnosis not present

## 2022-10-09 DIAGNOSIS — H9193 Unspecified hearing loss, bilateral: Secondary | ICD-10-CM | POA: Diagnosis not present

## 2022-10-09 DIAGNOSIS — E1122 Type 2 diabetes mellitus with diabetic chronic kidney disease: Secondary | ICD-10-CM | POA: Diagnosis not present

## 2022-10-09 DIAGNOSIS — Z96641 Presence of right artificial hip joint: Secondary | ICD-10-CM | POA: Diagnosis not present

## 2022-10-09 DIAGNOSIS — R2681 Unsteadiness on feet: Secondary | ICD-10-CM | POA: Diagnosis not present

## 2022-10-09 DIAGNOSIS — L304 Erythema intertrigo: Secondary | ICD-10-CM | POA: Diagnosis present

## 2022-10-09 DIAGNOSIS — G319 Degenerative disease of nervous system, unspecified: Secondary | ICD-10-CM | POA: Diagnosis not present

## 2022-10-09 DIAGNOSIS — Z66 Do not resuscitate: Secondary | ICD-10-CM | POA: Diagnosis not present

## 2022-10-09 DIAGNOSIS — M898X9 Other specified disorders of bone, unspecified site: Secondary | ICD-10-CM | POA: Diagnosis present

## 2022-10-09 DIAGNOSIS — Z881 Allergy status to other antibiotic agents status: Secondary | ICD-10-CM

## 2022-10-09 DIAGNOSIS — W010XXA Fall on same level from slipping, tripping and stumbling without subsequent striking against object, initial encounter: Secondary | ICD-10-CM | POA: Diagnosis present

## 2022-10-09 DIAGNOSIS — E785 Hyperlipidemia, unspecified: Secondary | ICD-10-CM | POA: Diagnosis not present

## 2022-10-09 DIAGNOSIS — I429 Cardiomyopathy, unspecified: Secondary | ICD-10-CM | POA: Diagnosis present

## 2022-10-09 DIAGNOSIS — I480 Paroxysmal atrial fibrillation: Secondary | ICD-10-CM | POA: Diagnosis not present

## 2022-10-09 DIAGNOSIS — I083 Combined rheumatic disorders of mitral, aortic and tricuspid valves: Secondary | ICD-10-CM | POA: Diagnosis present

## 2022-10-09 DIAGNOSIS — Z86010 Personal history of colonic polyps: Secondary | ICD-10-CM

## 2022-10-09 DIAGNOSIS — I2510 Atherosclerotic heart disease of native coronary artery without angina pectoris: Secondary | ICD-10-CM | POA: Diagnosis present

## 2022-10-09 DIAGNOSIS — S72001A Fracture of unspecified part of neck of right femur, initial encounter for closed fracture: Secondary | ICD-10-CM | POA: Diagnosis not present

## 2022-10-09 DIAGNOSIS — M47816 Spondylosis without myelopathy or radiculopathy, lumbar region: Secondary | ICD-10-CM | POA: Diagnosis not present

## 2022-10-09 DIAGNOSIS — E039 Hypothyroidism, unspecified: Secondary | ICD-10-CM | POA: Diagnosis not present

## 2022-10-09 DIAGNOSIS — W19XXXA Unspecified fall, initial encounter: Secondary | ICD-10-CM | POA: Diagnosis not present

## 2022-10-09 DIAGNOSIS — R41841 Cognitive communication deficit: Secondary | ICD-10-CM | POA: Diagnosis not present

## 2022-10-09 DIAGNOSIS — Z91048 Other nonmedicinal substance allergy status: Secondary | ICD-10-CM

## 2022-10-09 DIAGNOSIS — I6782 Cerebral ischemia: Secondary | ICD-10-CM | POA: Diagnosis not present

## 2022-10-09 DIAGNOSIS — N1831 Chronic kidney disease, stage 3a: Secondary | ICD-10-CM | POA: Diagnosis not present

## 2022-10-09 DIAGNOSIS — S72091D Other fracture of head and neck of right femur, subsequent encounter for closed fracture with routine healing: Secondary | ICD-10-CM | POA: Diagnosis not present

## 2022-10-09 DIAGNOSIS — I447 Left bundle-branch block, unspecified: Secondary | ICD-10-CM | POA: Diagnosis present

## 2022-10-09 DIAGNOSIS — I495 Sick sinus syndrome: Secondary | ICD-10-CM | POA: Diagnosis present

## 2022-10-09 DIAGNOSIS — Z01818 Encounter for other preprocedural examination: Secondary | ICD-10-CM | POA: Diagnosis not present

## 2022-10-09 DIAGNOSIS — R34 Anuria and oliguria: Secondary | ICD-10-CM | POA: Diagnosis not present

## 2022-10-09 DIAGNOSIS — K219 Gastro-esophageal reflux disease without esophagitis: Secondary | ICD-10-CM | POA: Diagnosis present

## 2022-10-09 DIAGNOSIS — I4819 Other persistent atrial fibrillation: Secondary | ICD-10-CM | POA: Diagnosis not present

## 2022-10-09 DIAGNOSIS — Z7401 Bed confinement status: Secondary | ICD-10-CM | POA: Diagnosis not present

## 2022-10-09 DIAGNOSIS — I5042 Chronic combined systolic (congestive) and diastolic (congestive) heart failure: Secondary | ICD-10-CM | POA: Diagnosis not present

## 2022-10-09 DIAGNOSIS — N183 Chronic kidney disease, stage 3 unspecified: Secondary | ICD-10-CM | POA: Diagnosis not present

## 2022-10-09 DIAGNOSIS — R519 Headache, unspecified: Secondary | ICD-10-CM | POA: Diagnosis not present

## 2022-10-09 DIAGNOSIS — M6281 Muscle weakness (generalized): Secondary | ICD-10-CM | POA: Diagnosis not present

## 2022-10-09 DIAGNOSIS — Z833 Family history of diabetes mellitus: Secondary | ICD-10-CM

## 2022-10-09 DIAGNOSIS — R0689 Other abnormalities of breathing: Secondary | ICD-10-CM | POA: Diagnosis not present

## 2022-10-09 DIAGNOSIS — E871 Hypo-osmolality and hyponatremia: Secondary | ICD-10-CM | POA: Diagnosis present

## 2022-10-09 DIAGNOSIS — M5125 Other intervertebral disc displacement, thoracolumbar region: Secondary | ICD-10-CM | POA: Diagnosis not present

## 2022-10-09 DIAGNOSIS — I10 Essential (primary) hypertension: Secondary | ICD-10-CM | POA: Diagnosis present

## 2022-10-09 DIAGNOSIS — Z8249 Family history of ischemic heart disease and other diseases of the circulatory system: Secondary | ICD-10-CM

## 2022-10-09 DIAGNOSIS — Z471 Aftercare following joint replacement surgery: Secondary | ICD-10-CM | POA: Diagnosis not present

## 2022-10-09 DIAGNOSIS — M545 Low back pain, unspecified: Secondary | ICD-10-CM | POA: Diagnosis not present

## 2022-10-09 DIAGNOSIS — M25551 Pain in right hip: Secondary | ICD-10-CM | POA: Diagnosis not present

## 2022-10-09 DIAGNOSIS — E119 Type 2 diabetes mellitus without complications: Secondary | ICD-10-CM | POA: Diagnosis not present

## 2022-10-09 DIAGNOSIS — S72141A Displaced intertrochanteric fracture of right femur, initial encounter for closed fracture: Secondary | ICD-10-CM | POA: Diagnosis not present

## 2022-10-09 DIAGNOSIS — Z91013 Allergy to seafood: Secondary | ICD-10-CM

## 2022-10-09 DIAGNOSIS — R262 Difficulty in walking, not elsewhere classified: Secondary | ICD-10-CM | POA: Diagnosis not present

## 2022-10-09 DIAGNOSIS — Z90710 Acquired absence of both cervix and uterus: Secondary | ICD-10-CM

## 2022-10-09 DIAGNOSIS — I442 Atrioventricular block, complete: Secondary | ICD-10-CM | POA: Diagnosis present

## 2022-10-09 DIAGNOSIS — E876 Hypokalemia: Secondary | ICD-10-CM | POA: Diagnosis not present

## 2022-10-09 DIAGNOSIS — Z91040 Latex allergy status: Secondary | ICD-10-CM

## 2022-10-09 DIAGNOSIS — S3992XA Unspecified injury of lower back, initial encounter: Secondary | ICD-10-CM | POA: Diagnosis not present

## 2022-10-09 DIAGNOSIS — Z880 Allergy status to penicillin: Secondary | ICD-10-CM

## 2022-10-09 DIAGNOSIS — M1711 Unilateral primary osteoarthritis, right knee: Secondary | ICD-10-CM | POA: Diagnosis not present

## 2022-10-09 DIAGNOSIS — S0990XA Unspecified injury of head, initial encounter: Secondary | ICD-10-CM | POA: Diagnosis not present

## 2022-10-09 DIAGNOSIS — R42 Dizziness and giddiness: Secondary | ICD-10-CM | POA: Diagnosis not present

## 2022-10-09 DIAGNOSIS — R2689 Other abnormalities of gait and mobility: Secondary | ICD-10-CM | POA: Diagnosis not present

## 2022-10-09 DIAGNOSIS — E038 Other specified hypothyroidism: Secondary | ICD-10-CM | POA: Diagnosis not present

## 2022-10-09 DIAGNOSIS — I700 Atherosclerosis of aorta: Secondary | ICD-10-CM | POA: Diagnosis not present

## 2022-10-09 DIAGNOSIS — Z79899 Other long term (current) drug therapy: Secondary | ICD-10-CM

## 2022-10-09 DIAGNOSIS — Z9181 History of falling: Secondary | ICD-10-CM | POA: Diagnosis not present

## 2022-10-09 DIAGNOSIS — G8918 Other acute postprocedural pain: Secondary | ICD-10-CM | POA: Diagnosis not present

## 2022-10-09 DIAGNOSIS — R410 Disorientation, unspecified: Secondary | ICD-10-CM | POA: Diagnosis not present

## 2022-10-09 DIAGNOSIS — Z888 Allergy status to other drugs, medicaments and biological substances status: Secondary | ICD-10-CM

## 2022-10-09 LAB — BASIC METABOLIC PANEL
Anion gap: 17 — ABNORMAL HIGH (ref 5–15)
BUN: 33 mg/dL — ABNORMAL HIGH (ref 8–23)
CO2: 20 mmol/L — ABNORMAL LOW (ref 22–32)
Calcium: 9.4 mg/dL (ref 8.9–10.3)
Chloride: 93 mmol/L — ABNORMAL LOW (ref 98–111)
Creatinine, Ser: 1.51 mg/dL — ABNORMAL HIGH (ref 0.44–1.00)
GFR, Estimated: 31 mL/min — ABNORMAL LOW (ref >60)
Glucose, Bld: 124 mg/dL — ABNORMAL HIGH (ref 70–99)
Potassium: 3.3 mmol/L — ABNORMAL LOW (ref 3.5–5.1)
Sodium: 130 mmol/L — ABNORMAL LOW (ref 135–145)

## 2022-10-09 LAB — CBC
HCT: 36.5 % (ref 36.0–46.0)
Hemoglobin: 12 g/dL (ref 12.0–15.0)
MCH: 32.3 pg (ref 26.0–34.0)
MCHC: 32.9 g/dL (ref 30.0–36.0)
MCV: 98.4 fL (ref 80.0–100.0)
Platelets: 151 K/uL (ref 150–400)
RBC: 3.71 MIL/uL — ABNORMAL LOW (ref 3.87–5.11)
RDW: 14.5 % (ref 11.5–15.5)
WBC: 6 K/uL (ref 4.0–10.5)
nRBC: 0 % (ref 0.0–0.2)

## 2022-10-09 LAB — PROTIME-INR
INR: 1.2 (ref 0.8–1.2)
Prothrombin Time: 15.6 seconds — ABNORMAL HIGH (ref 11.4–15.2)

## 2022-10-09 LAB — GLUCOSE, CAPILLARY
Glucose-Capillary: 139 mg/dL — ABNORMAL HIGH (ref 70–99)
Glucose-Capillary: 121 mg/dL — ABNORMAL HIGH (ref 70–99)

## 2022-10-09 LAB — CK: Total CK: 83 U/L (ref 38–234)

## 2022-10-09 LAB — TROPONIN I (HIGH SENSITIVITY)
Troponin I (High Sensitivity): 29 ng/L — ABNORMAL HIGH (ref <18)
Troponin I (High Sensitivity): 35 ng/L — ABNORMAL HIGH (ref <18)

## 2022-10-09 MED ORDER — FUROSEMIDE 40 MG PO TABS
40.0000 mg | ORAL_TABLET | Freq: Every day | ORAL | Status: DC
  Administered 2022-10-09 – 2022-10-16 (×7): 40 mg via ORAL
  Filled 2022-10-09 (×7): qty 1

## 2022-10-09 MED ORDER — SENNA 8.6 MG PO TABS
17.2000 mg | ORAL_TABLET | Freq: Every day | ORAL | Status: DC
  Administered 2022-10-09 – 2022-10-16 (×7): 17.2 mg via ORAL
  Filled 2022-10-09 (×7): qty 2

## 2022-10-09 MED ORDER — PANTOPRAZOLE SODIUM 40 MG PO TBEC: 40.0000 mg | DELAYED_RELEASE_TABLET | Freq: Every day | ORAL | Status: DC

## 2022-10-09 MED ORDER — HYDROMORPHONE HCL 1 MG/ML IJ SOLN
0.5000 mg | INTRAMUSCULAR | Status: DC | PRN
  Administered 2022-10-09 – 2022-10-10 (×2): 0.5 mg via INTRAVENOUS
  Filled 2022-10-09 (×2): qty 0.5

## 2022-10-09 MED ORDER — FENTANYL CITRATE PF 50 MCG/ML IJ SOSY
50.0000 ug | PREFILLED_SYRINGE | Freq: Once | INTRAMUSCULAR | Status: AC
  Administered 2022-10-09: 50 ug via INTRAVENOUS
  Filled 2022-10-09: qty 1

## 2022-10-09 MED ORDER — TRAMADOL HCL 50 MG PO TABS
50.0000 mg | ORAL_TABLET | Freq: Four times a day (QID) | ORAL | Status: DC
  Administered 2022-10-09 – 2022-10-12 (×6): 50 mg via ORAL
  Filled 2022-10-09 (×6): qty 1

## 2022-10-09 MED ORDER — ALBUTEROL SULFATE (2.5 MG/3ML) 0.083% IN NEBU
2.5000 mg | INHALATION_SOLUTION | Freq: Four times a day (QID) | RESPIRATORY_TRACT | Status: DC | PRN
  Administered 2022-10-09: 2.5 mg via RESPIRATORY_TRACT
  Filled 2022-10-09: qty 3

## 2022-10-09 MED ORDER — TRAZODONE HCL 50 MG PO TABS
50.0000 mg | ORAL_TABLET | Freq: Every day | ORAL | Status: DC
  Administered 2022-10-09 – 2022-10-15 (×7): 50 mg via ORAL
  Filled 2022-10-09 (×7): qty 1

## 2022-10-09 MED ORDER — HYDROMORPHONE HCL 1 MG/ML IJ SOLN
0.5000 mg | Freq: Once | INTRAMUSCULAR | Status: AC
  Administered 2022-10-09: 0.5 mg via INTRAVENOUS
  Filled 2022-10-09: qty 1

## 2022-10-09 MED ORDER — BISOPROLOL FUMARATE 5 MG PO TABS
2.5000 mg | ORAL_TABLET | Freq: Every day | ORAL | Status: DC
  Administered 2022-10-10 – 2022-10-16 (×7): 2.5 mg via ORAL
  Filled 2022-10-09 (×9): qty 0.5

## 2022-10-09 MED ORDER — ACETAMINOPHEN 500 MG PO TABS
1000.0000 mg | ORAL_TABLET | Freq: Three times a day (TID) | ORAL | Status: DC
  Administered 2022-10-09 – 2022-10-16 (×22): 1000 mg via ORAL
  Filled 2022-10-09 (×22): qty 2

## 2022-10-09 MED ORDER — SODIUM CHLORIDE 0.45 % IV SOLN: INTRAVENOUS | Status: DC

## 2022-10-09 MED ORDER — BUPIVACAINE HCL (PF) 0.5 % IJ SOLN
INTRAMUSCULAR | Status: DC | PRN
  Administered 2022-10-09: 30 mL via PERINEURAL

## 2022-10-09 MED ORDER — HALOPERIDOL LACTATE 5 MG/ML IJ SOLN
2.0000 mg | Freq: Once | INTRAMUSCULAR | Status: AC
  Administered 2022-10-09: 2 mg via INTRAVENOUS
  Filled 2022-10-09: qty 1

## 2022-10-09 MED ORDER — LIDOCAINE 5 % EX PTCH
1.0000 | MEDICATED_PATCH | CUTANEOUS | Status: DC
  Administered 2022-10-09 – 2022-10-16 (×8): 1 via TRANSDERMAL
  Filled 2022-10-09 (×8): qty 1

## 2022-10-09 MED ORDER — HEPARIN SODIUM (PORCINE) 5000 UNIT/ML IJ SOLN
5000.0000 [IU] | Freq: Three times a day (TID) | INTRAMUSCULAR | Status: DC
  Administered 2022-10-09 – 2022-10-11 (×5): 5000 [IU] via SUBCUTANEOUS
  Filled 2022-10-09 (×6): qty 1

## 2022-10-09 MED ORDER — POTASSIUM CHLORIDE CRYS ER 10 MEQ PO TBCR
10.0000 meq | EXTENDED_RELEASE_TABLET | ORAL | Status: DC
  Administered 2022-10-10 – 2022-10-15 (×3): 10 meq via ORAL
  Filled 2022-10-09 (×3): qty 1

## 2022-10-09 MED ORDER — POLYETHYLENE GLYCOL 3350 17 G PO PACK
17.0000 g | PACK | Freq: Every day | ORAL | Status: DC
  Administered 2022-10-10 – 2022-10-16 (×5): 17 g via ORAL
  Filled 2022-10-09 (×7): qty 1

## 2022-10-09 MED ORDER — ACETAMINOPHEN 325 MG PO TABS: 650.0000 mg | ORAL_TABLET | Freq: Three times a day (TID) | ORAL | Status: DC | PRN

## 2022-10-09 MED ORDER — ACETAMINOPHEN 500 MG PO TABS
1000.0000 mg | ORAL_TABLET | ORAL | Status: DC
  Filled 2022-10-09: qty 2

## 2022-10-09 MED ORDER — METOLAZONE 2.5 MG PO TABS
2.5000 mg | ORAL_TABLET | ORAL | Status: DC
  Administered 2022-10-10 – 2022-10-15 (×3): 2.5 mg via ORAL
  Filled 2022-10-09 (×3): qty 1

## 2022-10-09 MED ORDER — LEVOTHYROXINE SODIUM 112 MCG PO TABS
112.0000 ug | ORAL_TABLET | Freq: Every day | ORAL | Status: DC
  Administered 2022-10-10 – 2022-10-16 (×7): 112 ug via ORAL
  Filled 2022-10-09 (×7): qty 1

## 2022-10-09 MED ORDER — INSULIN ASPART 100 UNIT/ML IJ SOLN
0.0000 [IU] | Freq: Three times a day (TID) | INTRAMUSCULAR | Status: DC
  Administered 2022-10-09 – 2022-10-10 (×3): 2 [IU] via SUBCUTANEOUS
  Administered 2022-10-12 (×3): 3 [IU] via SUBCUTANEOUS
  Administered 2022-10-13: 2 [IU] via SUBCUTANEOUS
  Administered 2022-10-13: 3 [IU] via SUBCUTANEOUS
  Administered 2022-10-14 – 2022-10-16 (×3): 2 [IU] via SUBCUTANEOUS

## 2022-10-09 NOTE — Assessment & Plan Note (Signed)
Cr at baseline. Patient with poor PO intake over last 12-24 hrs.  Plan Avoid nephrotoxic meds  Low flow IVF

## 2022-10-09 NOTE — Assessment & Plan Note (Signed)
Stable rate. Not on anticoagulation at this time. Was on warfarin. Was tried on Eliquis which she did not tolerate.  Plan Continue home regimen  Cardilogy to see for surgical clearance  Routine DVT prophylaxis

## 2022-10-09 NOTE — ED Provider Notes (Signed)
[place] EMERGENCY DEPARTMENT AT [facility] Provider Note   CSN: [id number] Arrival date & time: 10/09/22  9528     History  Chief Complaint  Patient presents with   Beverly Kim    Beverly Kim is a 89 y.o. female.  89 yo F with hx of AF not on AC, non-occlusive CAD, CHB, CKD, and [employer] pacemaker who presents to the ed after a fall. History limited since patient is hard of hearing and in pain.  Patient presenting from home after an unwitnessed fall.  Failure reported to EMS that the patient felt dizzy and fell to the ground.  Complaining of right hip pain and back pain after the fall.  Family is unsure if she hit her head or not.  Was given 100 mcg of fentanyl on the way to the emergency department by EMS.       Home Medications Prior to Admission medications   Medication Sig Start Date End Date Taking? Authorizing Provider  acetaminophen (TYLENOL) 650 MG CR tablet Take 650 mg by mouth every 8 (eight) hours as needed for pain.   Yes [provider]  albuterol (VENTOLIN HFA) 108 (90 Base) MCG/ACT inhaler Inhale 1-2 puffs into the lungs every 6 (six) hours as needed for wheezing or shortness of breath. 05/31/22  Yes Burns, Bobette Mo, MD  bisoprolol (ZEBETA) 5 MG tablet Take 0.5 tablets (2.5 mg total) by mouth daily. 02/14/22  Yes Marykay Lex, MD  furosemide (LASIX) 40 MG tablet TAKE 1 TABLET BY MOUTH EVERY DAY Patient taking differently: Take 40 mg by mouth daily. 04/30/22  Yes Marykay Lex, MD  metolazone (ZAROXOLYN) 2.5 MG tablet TAKE 1 TABLET 3 TIMES WEEKLY (Monday,  Wednesday, Saturday) Patient taking differently: Take 2.5 mg by mouth See admin instructions. TAKE 1 TABLET 3 TIMES WEEKLY (Monday, Wednesday, Saturday) 01/02/22  Yes Marykay Lex, MD  polyethylene glycol (MIRALAX / GLYCOLAX) 17 g packet Take 17 g by mouth daily.   Yes [provider]  potassium chloride (KLOR-CON) 10 MEQ tablet TAKE 1 TABLET ( 10 MEQ total) BY MOUTH ONCE DAILY  ON  DAYS  METOLAZONE  IS  TAKEN Patient taking differently: Take 10 mEq by mouth See admin instructions. Take 1 tablet by mouth every Monday, Wednesday, Saturday 10/25/21  Yes Marykay Lex, MD  promethazine (PHENERGAN) 12.5 MG tablet Take 0.5 tablets (6.25 mg total) by mouth every 6 (six) hours as needed for nausea or vomiting. 09/17/22  Yes Crain, Whitney L, PA  Sennosides (SENOKOT EXTRA STRENGTH) 17.2 MG TABS Take 34.4 mg by mouth daily. Take 2 tablets by mouth daily   Yes [provider]  SYNTHROID 112 MCG tablet Take 1 tablet (112 mcg total) by mouth daily before breakfast. 05/09/22  Yes Burns, Bobette Mo, MD  traZODone (DESYREL) 50 MG tablet TAKE 1/2 TO 1 TABLET (25 TO 50 MG TOTAL) BY MOUTH AT BEDTIME Patient taking differently: Take 50 mg by mouth at bedtime. 10/05/22  Yes Burns, Bobette Mo, MD  ANUCORT-HC 25 MG suppository Place 1 suppository (25 mg total) rectally 2 (two) times daily as needed for hemorrhoids or anal itching. Patient not taking: Reported on 10/03/2022 09/27/22   Guy Sandifer L, PA  apixaban (ELIQUIS) 5 MG TABS tablet Take 1 tablet (5 mg total) by mouth 2 (two) times daily. Patient not taking: Reported on 09/25/2022 09/08/22   [name], MD  ondansetron (ZOFRAN-ODT) 4 MG disintegrating tablet Take 1 tablet (4 mg total) by  mouth every 8 (eight) hours as needed for nausea or vomiting. Patient not taking: Reported on 10/03/2022 09/21/22   Corwin Levins, MD  pantoprazole (PROTONIX) 40 MG tablet TAKE 1 TABLET BY MOUTH EVERY DAY Patient not taking: Reported on 10/09/2022 06/04/22   Doree Albee, PA-C      Allergies    Adhesive [tape], Ace inhibitors, Atorvastatin, Clindamycin, Codeine, Latex, Shellfish allergy, Simvastatin, and Penicillins    Review of Systems   Review of Systems  Physical Exam Updated Vital Signs BP (!) 133/56   Pulse 65   Temp 97.8 F (36.6 C) (Oral)   Resp 20   SpO2 100%  Physical Exam Constitutional:      General: She is not in acute  distress.    Appearance: Normal appearance. She is not ill-appearing.  HENT:     Head: Normocephalic and atraumatic.     Right Ear: External ear normal.     Left Ear: External ear normal.     Mouth/Throat:     Mouth: Mucous membranes are moist.     Pharynx: Oropharynx is clear.  Eyes:     Extraocular Movements: Extraocular movements intact.     Conjunctiva/sclera: Conjunctivae normal.     Pupils: Pupils are equal, round, and reactive to light.  Neck:     Comments: No C-spine midline tenderness to palpation Cardiovascular:     Rate and Rhythm: Normal rate. Rhythm irregular.     Pulses: Normal pulses.     Heart sounds: Normal heart sounds.  Pulmonary:     Effort: Pulmonary effort is normal. No respiratory distress.     Breath sounds: Normal breath sounds.  Abdominal:     General: Abdomen is flat. There is no distension.     Palpations: Abdomen is soft. There is no mass.     Tenderness: There is no abdominal tenderness. There is no guarding.  Musculoskeletal:        General: Deformity present.     Cervical back: No rigidity or tenderness.     Comments: No tenderness to palpation of chest wall.  No bruising noted.  No tenderness to palpation of bilateral clavicles.  No tenderness to palpation, bruising, or deformities noted of bilateral shoulders, elbows, wrists, knees, or ankles.  Tenderness palpation of right hip with external rotation of the right lower extremity and shortening of the limb.  DP pulses 2+ bilaterally.  Neurological:     Mental Status: She is alert.     ED Results / Procedures / Treatments   Labs (all labs ordered are listed, but only abnormal results are displayed) Labs Reviewed  CBC - Abnormal; Notable for the following components:      Result Value   RBC 3.71 (*)    All other components within normal limits  BASIC METABOLIC PANEL - Abnormal; Notable for the following components:   Sodium 130 (*)    Potassium 3.3 (*)    Chloride 93 (*)    CO2 20 (*)     Glucose, Bld 124 (*)    BUN 33 (*)    Creatinine, Ser 1.51 (*)    GFR, Estimated 31 (*)    Anion gap 17 (*)    All other components within normal limits  TROPONIN I (HIGH SENSITIVITY) - Abnormal; Notable for the following components:   Troponin I (High Sensitivity) 29 (*)    All other components within normal limits  CK  TROPONIN I (HIGH SENSITIVITY)    EKG EKG Interpretation Date/Time:  Tuesday October 09 2022 08:40:24 EDT Ventricular Rate:  60 PR Interval:    QRS Duration:  170 QT Interval:  549 QTC Calculation: 549 R Axis:   -80  Text Interpretation: ventricularly paced rhythm Nonspecific IVCD with LAD LVH with secondary repolarization abnormality Anterolateral infarct, age indeterminate Confirmed by Vonita Moss [phone]) on 10/09/2022 9:01:41 AM  Radiology CT Head Wo Contrast  Result Date: 10/09/2022 CLINICAL DATA:  89 year old female history of trauma from a fall. Head and neck pain. EXAM: CT HEAD WITHOUT CONTRAST CT CERVICAL SPINE WITHOUT CONTRAST TECHNIQUE: Multidetector CT imaging of the head and cervical spine was performed following the standard protocol without intravenous contrast. Multiplanar CT image reconstructions of the cervical spine were also generated. RADIATION DOSE REDUCTION: This exam was performed according to the departmental dose-optimization program which includes automated exposure control, adjustment of the mA and/or kV according to patient size and/or use of iterative reconstruction technique. COMPARISON:  Head CT 10/03/2022.  No prior cervical spine CT. FINDINGS: CT HEAD FINDINGS Brain: Mild cerebral atrophy. Patchy and confluent areas of decreased attenuation are noted throughout the deep and periventricular white matter of the cerebral hemispheres bilaterally, compatible with chronic microvascular ischemic disease. No evidence of acute infarction, hemorrhage, hydrocephalus, extra-axial collection or mass lesion/mass effect. Vascular: No hyperdense  vessel or unexpected calcification. Skull: Normal. Negative for fracture or focal lesion. Sinuses/Orbits: No acute finding. Other: Small left mastoid effusion, unchanged. CT CERVICAL SPINE FINDINGS Comment: Study is limited by considerable patient motion. Alignment: Normal. Skull base and vertebrae: No acute fracture. No primary bone lesion or focal pathologic process. Soft tissues and spinal canal: No prevertebral fluid or swelling. No visible canal hematoma. Disc levels: Multilevel degenerative disc disease, most pronounced at C5-C6. Moderate multilevel facet arthropathy bilaterally. Upper chest: Negative. Other: None. IMPRESSION: 1. No evidence of significant acute traumatic injury to the skull, brain or cervical spine. 2. Mild cerebral atrophy with chronic microvascular ischemic changes in the cerebral white matter, as above. 3. Multilevel degenerative disc disease and cervical spondylosis, as above. Electronically Signed   By: Trudie Reed M.D.   On: 10/09/2022 08:08   CT Cervical Spine Wo Contrast  Result Date: 10/09/2022 CLINICAL DATA:  89 year old female history of trauma from a fall. Head and neck pain. EXAM: CT HEAD WITHOUT CONTRAST CT CERVICAL SPINE WITHOUT CONTRAST TECHNIQUE: Multidetector CT imaging of the head and cervical spine was performed following the standard protocol without intravenous contrast. Multiplanar CT image reconstructions of the cervical spine were also generated. RADIATION DOSE REDUCTION: This exam was performed according to the departmental dose-optimization program which includes automated exposure control, adjustment of the mA and/or kV according to patient size and/or use of iterative reconstruction technique. COMPARISON:  Head CT 10/03/2022.  No prior cervical spine CT. FINDINGS: CT HEAD FINDINGS Brain: Mild cerebral atrophy. Patchy and confluent areas of decreased attenuation are noted throughout the deep and periventricular white matter of the cerebral hemispheres  bilaterally, compatible with chronic microvascular ischemic disease. No evidence of acute infarction, hemorrhage, hydrocephalus, extra-axial collection or mass lesion/mass effect. Vascular: No hyperdense vessel or unexpected calcification. Skull: Normal. Negative for fracture or focal lesion. Sinuses/Orbits: No acute finding. Other: Small left mastoid effusion, unchanged. CT CERVICAL SPINE FINDINGS Comment: Study is limited by considerable patient motion. Alignment: Normal. Skull base and vertebrae: No acute fracture. No primary bone lesion or focal pathologic process. Soft tissues and spinal canal: No prevertebral fluid or swelling. No visible canal hematoma. Disc levels: Multilevel degenerative disc disease, most pronounced at C5-C6.  Moderate multilevel facet arthropathy bilaterally. Upper chest: Negative. Other: None. IMPRESSION: 1. No evidence of significant acute traumatic injury to the skull, brain or cervical spine. 2. Mild cerebral atrophy with chronic microvascular ischemic changes in the cerebral white matter, as above. 3. Multilevel degenerative disc disease and cervical spondylosis, as above. Electronically Signed   By: Trudie Reed M.D.   On: 10/09/2022 08:08   CT Lumbar Spine Wo Contrast  Result Date: 10/09/2022 CLINICAL DATA:  Back trauma, no prior imaging (Age >= 16y) EXAM: CT LUMBAR SPINE WITHOUT CONTRAST TECHNIQUE: Multidetector CT imaging of the lumbar spine was performed without intravenous contrast administration. Multiplanar CT image reconstructions were also generated. RADIATION DOSE REDUCTION: This exam was performed according to the departmental dose-optimization program which includes automated exposure control, adjustment of the mA and/or kV according to patient size and/or use of iterative reconstruction technique. COMPARISON:  BCT AP 10/03/22 FINDINGS: Segmentation: 5 lumbar type vertebrae. Alignment: Trace retrolisthesis of T12-L1. Grade 1 anterolisthesis of L4 on L5. Grade 1-2  anterolisthesis of on S1. Vertebrae: Vertebral body heights are maintained. No focal pathologic process. Paraspinal and other soft tissues: Bibasilar atelectasis. Aortic atherosclerotic calcifications. Status post cholecystectomy. No evidence of hydronephrosis or nephrolithiasis. Compared to prior exam there is increased fluid in the right lower quadrant, which is nonspecific and may be reactive. Disc levels: There is no evidence of high-grade spinal canal stenosis. Moderate bilateral foraminal narrowing at L5-S1. There is degenerative fusion of the L4-L5 and L5-S1 facet joints. IMPRESSION: 1. No acute fracture or traumatic listhesis. 2. Compared to prior exam there is increased fluid in the right lower quadrant, which is nonspecific and may be reactive Aortic Atherosclerosis (ICD10-I70.0). Electronically Signed   By: Lorenza Cambridge M.D.   On: 10/09/2022 08:08   DG Pelvis 1-2 Views  Result Date: 10/09/2022 CLINICAL DATA:  Trauma, fall EXAM: PELVIS - 1-2 VIEW COMPARISON:  05/04/2003 FINDINGS: There is a displaced fracture in the neck of right femur. There is superior displacement of distal major fracture fragment. There is no dislocation in the right hip. Arterial calcifications are seen in soft tissues. IMPRESSION: Displaced fracture is seen in the neck of right femur. Electronically Signed   By: Ernie Avena M.D.   On: 10/09/2022 07:47   DG FEMUR, MIN 2 VIEWS RIGHT  Result Date: 10/09/2022 CLINICAL DATA:  Trauma, fall EXAM: RIGHT FEMUR 2 VIEWS COMPARISON:  None FINDINGS: There is fracture in the subcapital portion of neck of right femur. There is superior displacement of distal fracture fragment. There is no dislocation in the hip. Degenerative changes are noted in the right knee with bony spurs. Arterial calcifications are seen in the soft tissues. IMPRESSION: Displaced fracture is seen in the neck of right femur. Degenerative changes are noted in the right knee.  Arteriosclerosis. Electronically  Signed   By: Ernie Avena M.D.   On: 10/09/2022 07:46    Procedures Procedures    Medications Ordered in ED Medications  acetaminophen (TYLENOL) tablet 1,000 mg (1,000 mg Oral Not Given 10/09/22 0811)  lidocaine (LIDODERM) 5 % 1 patch (1 patch Transdermal Patch Applied 10/09/22 0927)  HYDROmorphone (DILAUDID) injection 0.5 mg (has no administration in time range)  haloperidol lactate (HALDOL) injection 2 mg (has no administration in time range)  acetaminophen (TYLENOL) tablet 650 mg (has no administration in time range)  bisoprolol (ZEBETA) tablet 2.5 mg (has no administration in time range)  furosemide (LASIX) tablet 40 mg (has no administration in time range)  metolazone (ZAROXOLYN) tablet 2.5  mg (has no administration in time range)  traZODone (DESYREL) tablet 50 mg (has no administration in time range)  levothyroxine (SYNTHROID) tablet 112 mcg (has no administration in time range)  polyethylene glycol (MIRALAX / GLYCOLAX) packet 17 g (has no administration in time range)  Sennosides TABS 34.4 mg (has no administration in time range)  potassium chloride (KLOR-CON) CR tablet 10 mEq (has no administration in time range)  albuterol (VENTOLIN HFA) 108 (90 Base) MCG/ACT inhaler 1-2 puff (has no administration in time range)  fentaNYL (SUBLIMAZE) injection 50 mcg (50 mcg Intravenous Given 10/09/22 0658)  HYDROmorphone (DILAUDID) injection 0.5 mg (0.5 mg Intravenous Given 10/09/22 8657)    ED Course/ Medical Decision Making/ A&P Clinical Course as of 10/09/22 1015  Tue Oct 09, 2022  0740 Discussed with Dale  from orthopedics will come evaluate the patient. [RP]  0749 Creatinine(!): 1.51 At baseline [RP]  0749 Sodium(!): 130 At baseline [RP]  0834 Per [employer] representative no vt or vf was in af with rvr with max rate of 120 with multiple occurrences.  [RP]  [phone] Dr Arthur Holms from the hospitalist admit the patient. [RP]    Clinical Course User Index [RP] Rondel Baton,  MD                                 Medical Decision Making Amount and/or Complexity of Data Reviewed Labs: ordered. Decision-making details documented in ED Course. Radiology: ordered.  Risk OTC drugs. Prescription drug management. Decision regarding hospitalization.   Beverly Kim is a 89 y.o. female with comorbidities that complicate the patient evaluation including atrial fibrillation not on anticoagulation, nonocclusive CAD, complete heart block status post [facility] pacemaker, and CKD who presented to the emergency department after an unwitnessed fall  Initial Ddx:  Syncope, mechanical fall, hip fracture, TBI, C-spine injury, lumbar spine fracture  MDM/Course:  Pt presented after an unwitnessed fall with left hip pain and back pain. Unclear exactly how the patient fell so she had a workup that included troponins, EKG, and PM interrogation that was unremarkable. Hip fracture noted on x-ray and she was discussed with ortho who will evaluate the patient for surgery. Upon re-evaluation was still having pain and was given several doses of pain medication. Was then admitted to medicine for further management of her hip fracture.  This patient presents to the ED for concern of complaints listed in HPI, this involves an extensive number of treatment options, and is a complaint that carries with it a high risk of complications and morbidity. Disposition including potential need for admission considered.   Dispo: DC Home. Return precautions discussed including, but not limited to, those listed in the AVS. Allowed pt time to ask questions which were answered fully prior to dc.  Additional history obtained from EMS Records reviewed Outpatient Clinic Notes The following labs were independently interpreted: Chemistry and show no acute abnormality I independently reviewed the following imaging with scope of interpretation limited to determining acute life threatening conditions related to  emergency care: Extremity x-ray(s) and agree with the radiologist interpretation with the following exceptions: none I personally reviewed and interpreted cardiac monitoring: atrial fibrillation (normal rate) I personally reviewed and interpreted the pt's EKG: see above for interpretation  I have reviewed the patients home medications and made adjustments as needed Consults: Hospitalist and Orthopedics Social Determinants of health:  Elderly         Final Clinical  Impression(s) / ED Diagnoses Final diagnoses:  Fall, initial encounter  Closed fracture of right hip, initial encounter (HCC)  History of heart block  Acute midline low back pain, unspecified whether sciatica present    Rx / DC Orders ED Discharge Orders     None         Rondel Baton, MD 10/09/22 1015

## 2022-10-09 NOTE — Subjective & Objective (Signed)
  Ms. Lope a 89 yo F with hx of AF not on AC (recently stopped Eliquis), non-occlusive CAD, CHB, CKD, CHF and [employer] pacemaker who had an unwitnessed fall at home. After the fall she was complaining of right hip pain and back pain unable to bear weight. EMS activated. Family  reported to EMS that the patient felt dizzy and fell to the ground. Patient transported to [facility] for evaluation. She was given fentanyl for pain while in transit.

## 2022-10-09 NOTE — Assessment & Plan Note (Signed)
Per family - no active problem  Plan PPI daily

## 2022-10-09 NOTE — ED Notes (Signed)
ED TO INPATIENT HANDOFF REPORT  ED Nurse Name and Phone #: Darral Dash RN [phone]  S Name/Age/Gender Beverly Kim 89 y.o. female Room/Bed: 040C/040C  Code Status   Code Status: DNR  Home/SNF/Other Home  Is this baseline? No. She usually is alert and orientated at home   Triage Complete: Triage complete  Chief Complaint Fracture of femoral neck, right (HCC) [S72.001A]  Triage Note Pt fell at home and is brought in via EMS. Shortening to the right hip and 10/10 pain. She was given 100 mcg of fentanyl by EMS. No relief. Patient is very hard of hearing.    Allergies Allergies  Allergen Reactions   Adhesive [Tape] Itching, Dermatitis, Rash and Other (See Comments)    Blisters and "skin bubbles"   Ace Inhibitors Cough   Atorvastatin Other (See Comments)    REACTION: Reaction not known   Clindamycin Other (See Comments)    Unknown   Codeine Other (See Comments)    hallucinations    Latex Other (See Comments)    blisters   Shellfish Allergy Other (See Comments)    "gallbladder attack"   Simvastatin Other (See Comments)    fatigue   Penicillins Rash    Level of Care/Admitting Diagnosis ED Disposition     ED Disposition  Admit   Condition  --   Comment  Hospital Area: [facility] [[id number]]  Level of Care: Med-Surg [16]  May admit patient to [facility] or [facility] if equivalent level of care is available:: No  Covid Evaluation: Asymptomatic - no recent exposure (last 10 days) testing not required  Diagnosis: Fracture of femoral neck, right [facility]) [[id number]]  Admitting Physician: Jacques Navy [[id number]]  Attending Physician: Jacques Navy [[id number]]  Certification:: I certify this patient will need inpatient services for at least 2 midnights  Estimated Length of Stay: 5          B Medical/Surgery History Past Medical History:  Diagnosis Date   Acute blood loss anemia 09/04/2022   Cardiac pacemaker 05/2016   Sick sinus syndrome/complete  heart block   Chronic heart failure with preserved ejection fraction (HFpEF) (HCC)    Colon polyps    Diverticulosis    Dyspnea    GERD (gastroesophageal reflux disease)    Guaiac positive stools 09/04/2022   Hearing loss of both ears    Hepatic cyst    Hiatal hernia    Hyperlipidemia    Hypertension    Hypothyroidism    Low back pain    Non-occlusive coronary artery disease 04/2001   Cardiac Cath 04/18/2001: 50 and 70% mid LAD after D1. = Nonischemic by Myoview.  Medical management.   Other left bundle branch block    PAF (paroxysmal atrial fibrillation) (HCC) 08/16/2016   observed on PPM interrogation, asymptomatic, chad2vasc score is 5.  Bisoprolol for rate control, Xarelto for anticoagulation.   Rectal bleeding 09/04/2022   URI (upper respiratory infection)    Past Surgical History:  Procedure Laterality Date   BIOPSY  09/05/2022   Procedure: BIOPSY;  Surgeon: Lemar Lofty., MD;  Location: [facility] ENDOSCOPY;  Service: Gastroenterology;;   CARDIOVERSION N/A 01/16/2019   Procedure: CARDIOVERSION;  Surgeon: Wendall Stade, MD;  Location: [facility] ENDOSCOPY;  Service: Cardiovascular;  Laterality: N/A;   CHOLECYSTECTOMY N/A 12/15/2019   Procedure: LAPAROSCOPIC CHOLECYSTECTOMY;  Surgeon: Almond Lint, MD;  Location: [facility] OR;  Service: General;  Laterality: N/A;   COLONOSCOPY     COLONOSCOPY N/A 09/05/2022   Procedure: COLONOSCOPY;  Surgeon: Lemar Lofty., MD;  Location: [facility] ENDOSCOPY;  Service: Gastroenterology;  Laterality: N/A;   ERCP N/A 07/21/2019   Procedure: ENDOSCOPIC RETROGRADE CHOLANGIOPANCREATOGRAPHY (ERCP);  Surgeon: Rachael Fee, MD;  Location: [facility] ENDOSCOPY;  Service: Endoscopy;  Laterality: N/A;   HERNIA REPAIR     HOT HEMOSTASIS N/A 09/05/2022   Procedure: HOT HEMOSTASIS (ARGON PLASMA COAGULATION/BICAP);  Surgeon: Lemar Lofty., MD;  Location: [facility] ENDOSCOPY;  Service: Gastroenterology;  Laterality: N/A;   LEFT HEART CATH AND CORONARY ANGIOGRAPHY   04/18/2001   Dr. Riley Kill: EF 60 to 65%.  2+ MR.  Upper LM takeoff with 90 degree bend just inside the ostium.  50-70% mid LAD after D1.  D1 is 30%.    Distal LAD wraps the apex and is free of disease.  LCx is mostly large OM branch.  RCA provides PDA and PL branches and Free of disease. -->  Presumably evaluated by Myoview that was nonischemic, therefore no PCI performed.   NM MYOVIEW LTD  01/2012   Normal EF.  Normal study.  No ischemia or infarction.   PACEMAKER IMPLANT Left 05/15/2016   SJM Assirity MRI dual chamber PPM implanted by Dr Johney Frame for CHB   POLYPECTOMY  09/05/2022   Procedure: POLYPECTOMY;  Surgeon: Mansouraty, Netty Starring., MD;  Location: [facility] ENDOSCOPY;  Service: Gastroenterology;;   REMOVAL OF STONES  07/21/2019   Procedure: REMOVAL OF STONES;  Surgeon: Rachael Fee, MD;  Location: [facility] ENDOSCOPY;  Service: Endoscopy;;   SPHINCTEROTOMY  07/21/2019   Procedure: Dennison Mascot;  Surgeon: Rachael Fee, MD;  Location: [facility] ENDOSCOPY;  Service: Endoscopy;;   THYROIDECTOMY     TONSILLECTOMY AND ADENOIDECTOMY     TOTAL ABDOMINAL HYSTERECTOMY W/ BILATERAL SALPINGOOPHORECTOMY     TRANSTHORACIC ECHOCARDIOGRAM  06/27/2016   Moderate basal-septal LVH, EF 60 to 65%.  No R WMA.  GR 1 DD.  Mild aortic valve calcification.  No AS.  Trivial MR.--Indicated improved EF compared to prior study in 2015.   TRANSTHORACIC ECHOCARDIOGRAM  02/22/2012   12/'13 -a) EF 50 and 55%.  Normal function.-Moderate MR.  Mild LA dilation.; b) 11/2015Echo for shortness of breath => reduced EF of 45 to 50%.  Mild LVH.  Inferior reversible HK.  GRII DD.  Moderate MR.   TRANSTHORACIC ECHOCARDIOGRAM  10/2019   EF 60 -65%.  No RWMA.  Severe concentric LVH.  Unable to assess diastolic function.  Mild biatrial dilation.  Moderate MR moderate TR, no AS   TRANSTHORACIC ECHOCARDIOGRAM  06/15/2021   EF 40 to 45%.  Paradoxical septal motion from pacemaker.  Mild concentric LVH.  Indeterminate diastolic pressure.  Severe  biatrial enlargement would argue significant diastolic dysfunction.  Severe MR and severe TR. => EF down from 60 to 65%.     A IV Location/Drains/Wounds Patient Lines/Drains/Airways Status     Active Line/Drains/Airways     Name Placement date Placement time Site Days   Peripheral IV 10/03/22 20 G Anterior;Proximal;Right Forearm 10/03/22  1714  Forearm  6   Peripheral IV 10/09/22 18 G Anterior;Left Wrist 10/09/22  0650  Wrist  less than 1   Incision - 4 Ports Abdomen 1: Umbilicus 2: Mid;Upper;Left 3: Right;Lateral;Lower Right;Upper 12/15/19  0837  -- 1029   Wound / Incision (Open or Dehisced) 12/15/19 (MASD) Moisture Associated Skin Damage Pelvis Anterior;Bilateral yeast 12/15/19  1200  Pelvis  1029            Intake/Output Last 24 hours No intake or output data in the 24  hours ending 10/09/22 1112  Labs/Imaging Results for orders placed or performed during the hospital encounter of 10/09/22 (from the past 48 hour(s))  CBC     Status: Abnormal   Collection Time: 10/09/22  6:50 AM  Result Value Ref Range   WBC 6.0 4.0 - 10.5 K/uL   RBC 3.71 (L) 3.87 - 5.11 MIL/uL   Hemoglobin 12.0 12.0 - 15.0 g/dL   HCT 16.1 09.6 - 04.5 %   MCV 98.4 80.0 - 100.0 fL   MCH 32.3 26.0 - 34.0 pg   MCHC 32.9 30.0 - 36.0 g/dL   RDW 40.9 81.1 - 91.4 %   Platelets 151 150 - 400 K/uL   nRBC 0.0 0.0 - 0.2 %    Comment: Performed at [facility], [address], [place], [place] [address]  Basic metabolic panel     Status: Abnormal   Collection Time: 10/09/22  6:50 AM  Result Value Ref Range   Sodium 130 (L) 135 - 145 mmol/L   Potassium 3.3 (L) 3.5 - 5.1 mmol/L   Chloride 93 (L) 98 - 111 mmol/L   CO2 20 (L) 22 - 32 mmol/L   Glucose, Bld 124 (H) 70 - 99 mg/dL    Comment: Glucose reference range applies only to samples taken after fasting for at least 8 hours.   BUN 33 (H) 8 - 23 mg/dL   Creatinine, Ser 6.21 (H) 0.44 - 1.00 mg/dL   Calcium 9.4 8.9 - 30.8 mg/dL   GFR, Estimated 31 (L) >60  mL/min    Comment: (NOTE) Calculated using the CKD-EPI Creatinine Equation (2021)    Anion gap 17 (H) 5 - 15    Comment: Performed at [facility], [address] [place], [place] [address]  CK     Status: None   Collection Time: 10/09/22  6:50 AM  Result Value Ref Range   Total CK 83 38 - 234 U/L    Comment: Performed at [facility], [address] [place], [place] [address]  Troponin I (High Sensitivity)     Status: Abnormal   Collection Time: 10/09/22  6:50 AM  Result Value Ref Range   Troponin I (High Sensitivity) 29 (H) <18 ng/L    Comment: (NOTE) Elevated high sensitivity troponin I (hsTnI) values and significant  changes across serial measurements may suggest ACS but many other  chronic and acute conditions are known to elevate hsTnI results.  Refer to the "Links" section for chest pain algorithms and additional  guidance. Performed at [facility], [address], [place], [place] [address]   Troponin I (High Sensitivity)     Status: Abnormal   Collection Time: 10/09/22  9:34 AM  Result Value Ref Range   Troponin I (High Sensitivity) 35 (H) <18 ng/L    Comment: (NOTE) Elevated high sensitivity troponin I (hsTnI) values and significant  changes across serial measurements may suggest ACS but many other  chronic and acute conditions are known to elevate hsTnI results.  Refer to the "Links" section for chest pain algorithms and additional  guidance. Performed at [facility], [address] [place], [place] [address]    CT Head Wo Contrast  Result Date: 10/09/2022 CLINICAL DATA:  89 year old female history of trauma from a fall. Head and neck pain. EXAM: CT HEAD WITHOUT CONTRAST CT CERVICAL SPINE WITHOUT CONTRAST TECHNIQUE: Multidetector CT imaging of the head and cervical spine was performed following the standard protocol without  intravenous contrast. Multiplanar CT image reconstructions of the cervical spine were also generated. RADIATION DOSE  REDUCTION: This exam was performed according to the departmental dose-optimization program which includes automated exposure control, adjustment of the mA and/or kV according to patient size and/or use of iterative reconstruction technique. COMPARISON:  Head CT 10/03/2022.  No prior cervical spine CT. FINDINGS: CT HEAD FINDINGS Brain: Mild cerebral atrophy. Patchy and confluent areas of decreased attenuation are noted throughout the deep and periventricular white matter of the cerebral hemispheres bilaterally, compatible with chronic microvascular ischemic disease. No evidence of acute infarction, hemorrhage, hydrocephalus, extra-axial collection or mass lesion/mass effect. Vascular: No hyperdense vessel or unexpected calcification. Skull: Normal. Negative for fracture or focal lesion. Sinuses/Orbits: No acute finding. Other: Small left mastoid effusion, unchanged. CT CERVICAL SPINE FINDINGS Comment: Study is limited by considerable patient motion. Alignment: Normal. Skull base and vertebrae: No acute fracture. No primary bone lesion or focal pathologic process. Soft tissues and spinal canal: No prevertebral fluid or swelling. No visible canal hematoma. Disc levels: Multilevel degenerative disc disease, most pronounced at C5-C6. Moderate multilevel facet arthropathy bilaterally. Upper chest: Negative. Other: None. IMPRESSION: 1. No evidence of significant acute traumatic injury to the skull, brain or cervical spine. 2. Mild cerebral atrophy with chronic microvascular ischemic changes in the cerebral white matter, as above. 3. Multilevel degenerative disc disease and cervical spondylosis, as above. Electronically Signed   By: Trudie Reed M.D.   On: 10/09/2022 08:08   CT Cervical Spine Wo Contrast  Result Date: 10/09/2022 CLINICAL DATA:  89 year old female history of trauma from a fall. Head and neck pain. EXAM: CT HEAD WITHOUT CONTRAST CT CERVICAL SPINE WITHOUT CONTRAST TECHNIQUE: Multidetector CT imaging of  the head and cervical spine was performed following the standard protocol without intravenous contrast. Multiplanar CT image reconstructions of the cervical spine were also generated. RADIATION DOSE REDUCTION: This exam was performed according to the departmental dose-optimization program which includes automated exposure control, adjustment of the mA and/or kV according to patient size and/or use of iterative reconstruction technique. COMPARISON:  Head CT 10/03/2022.  No prior cervical spine CT. FINDINGS: CT HEAD FINDINGS Brain: Mild cerebral atrophy. Patchy and confluent areas of decreased attenuation are noted throughout the deep and periventricular white matter of the cerebral hemispheres bilaterally, compatible with chronic microvascular ischemic disease. No evidence of acute infarction, hemorrhage, hydrocephalus, extra-axial collection or mass lesion/mass effect. Vascular: No hyperdense vessel or unexpected calcification. Skull: Normal. Negative for fracture or focal lesion. Sinuses/Orbits: No acute finding. Other: Small left mastoid effusion, unchanged. CT CERVICAL SPINE FINDINGS Comment: Study is limited by considerable patient motion. Alignment: Normal. Skull base and vertebrae: No acute fracture. No primary bone lesion or focal pathologic process. Soft tissues and spinal canal: No prevertebral fluid or swelling. No visible canal hematoma. Disc levels: Multilevel degenerative disc disease, most pronounced at C5-C6. Moderate multilevel facet arthropathy bilaterally. Upper chest: Negative. Other: None. IMPRESSION: 1. No evidence of significant acute traumatic injury to the skull, brain or cervical spine. 2. Mild cerebral atrophy with chronic microvascular ischemic changes in the cerebral white matter, as above. 3. Multilevel degenerative disc disease and cervical spondylosis, as above. Electronically Signed   By: Trudie Reed M.D.   On: 10/09/2022 08:08   CT Lumbar Spine Wo Contrast  Result Date:  10/09/2022 CLINICAL DATA:  Back trauma, no prior imaging (Age >= 16y) EXAM: CT LUMBAR SPINE WITHOUT CONTRAST TECHNIQUE: Multidetector CT imaging of the lumbar spine was performed without intravenous contrast administration. Multiplanar CT  image reconstructions were also generated. RADIATION DOSE REDUCTION: This exam was performed according to the departmental dose-optimization program which includes automated exposure control, adjustment of the mA and/or kV according to patient size and/or use of iterative reconstruction technique. COMPARISON:  BCT AP 10/03/22 FINDINGS: Segmentation: 5 lumbar type vertebrae. Alignment: Trace retrolisthesis of T12-L1. Grade 1 anterolisthesis of L4 on L5. Grade 1-2 anterolisthesis of on S1. Vertebrae: Vertebral body heights are maintained. No focal pathologic process. Paraspinal and other soft tissues: Bibasilar atelectasis. Aortic atherosclerotic calcifications. Status post cholecystectomy. No evidence of hydronephrosis or nephrolithiasis. Compared to prior exam there is increased fluid in the right lower quadrant, which is nonspecific and may be reactive. Disc levels: There is no evidence of high-grade spinal canal stenosis. Moderate bilateral foraminal narrowing at L5-S1. There is degenerative fusion of the L4-L5 and L5-S1 facet joints. IMPRESSION: 1. No acute fracture or traumatic listhesis. 2. Compared to prior exam there is increased fluid in the right lower quadrant, which is nonspecific and may be reactive Aortic Atherosclerosis (ICD10-I70.0). Electronically Signed   By: Lorenza Cambridge M.D.   On: 10/09/2022 08:08   DG Pelvis 1-2 Views  Result Date: 10/09/2022 CLINICAL DATA:  Trauma, fall EXAM: PELVIS - 1-2 VIEW COMPARISON:  05/04/2003 FINDINGS: There is a displaced fracture in the neck of right femur. There is superior displacement of distal major fracture fragment. There is no dislocation in the right hip. Arterial calcifications are seen in soft tissues. IMPRESSION:  Displaced fracture is seen in the neck of right femur. Electronically Signed   By: Ernie Avena M.D.   On: 10/09/2022 07:47   DG FEMUR, MIN 2 VIEWS RIGHT  Result Date: 10/09/2022 CLINICAL DATA:  Trauma, fall EXAM: RIGHT FEMUR 2 VIEWS COMPARISON:  None FINDINGS: There is fracture in the subcapital portion of neck of right femur. There is superior displacement of distal fracture fragment. There is no dislocation in the hip. Degenerative changes are noted in the right knee with bony spurs. Arterial calcifications are seen in the soft tissues. IMPRESSION: Displaced fracture is seen in the neck of right femur. Degenerative changes are noted in the right knee.  Arteriosclerosis. Electronically Signed   By: Ernie Avena M.D.   On: 10/09/2022 07:46    Pending Labs Unresulted Labs (From admission, onward)    None       Vitals/Pain Today's Vitals   10/09/22 0910 10/09/22 1030 10/09/22 1100 10/09/22 1107  BP:  (!) 138/55 (!) 142/55 (!) 152/51  Pulse: 65 60 (!) 59 60  Resp: 20 (!) 26 15 18   Temp:    97.9 F (36.6 C)  TempSrc:    Oral  SpO2: 100% 100% 99% 98%  PainSc:        Isolation Precautions No active isolations  Medications Medications  lidocaine (LIDODERM) 5 % 1 patch (1 patch Transdermal Patch Applied 10/09/22 0927)  bisoprolol (ZEBETA) tablet 2.5 mg (has no administration in time range)  furosemide (LASIX) tablet 40 mg (has no administration in time range)  metolazone (ZAROXOLYN) tablet 2.5 mg (has no administration in time range)  traZODone (DESYREL) tablet 50 mg (has no administration in time range)  levothyroxine (SYNTHROID) tablet 112 mcg (has no administration in time range)  polyethylene glycol (MIRALAX / GLYCOLAX) packet 17 g (has no administration in time range)  senna (SENOKOT) tablet 17.2 mg (has no administration in time range)  potassium chloride (KLOR-CON M) CR tablet 10 mEq (has no administration in time range)  albuterol (PROVENTIL) (2.5 MG/3ML) 0.083%  nebulizer solution 2.5 mg (has no administration in time range)  heparin injection 5,000 Units (has no administration in time range)  0.45 % sodium chloride infusion (has no administration in time range)  HYDROmorphone (DILAUDID) injection 0.5 mg (has no administration in time range)  acetaminophen (TYLENOL) tablet 1,000 mg (has no administration in time range)  traMADol (ULTRAM) tablet 50 mg (has no administration in time range)  insulin aspart (novoLOG) injection 0-15 Units (has no administration in time range)  fentaNYL (SUBLIMAZE) injection 50 mcg (50 mcg Intravenous Given 10/09/22 0658)  HYDROmorphone (DILAUDID) injection 0.5 mg (0.5 mg Intravenous Given 10/09/22 0811)  HYDROmorphone (DILAUDID) injection 0.5 mg (0.5 mg Intravenous Given 10/09/22 1018)  haloperidol lactate (HALDOL) injection 2 mg (2 mg Intravenous Given 10/09/22 1017)    Mobility walks with device     Focused Assessments    R Recommendations: See Admitting Provider Note  Report given to:   Additional Notes: Morning medications not given due to patient unable to follow commands. She only states "it hurts". Patient administered haldol and dilaudid. Daughter at bedside. Hospitalist wants ortho to come and position patient.

## 2022-10-09 NOTE — ED Triage Notes (Signed)
Pt fell at home and is brought in via EMS. Shortening to the right hip and 10/10 pain. She was given 100 mcg of fentanyl by EMS. No relief. Patient is very hard of hearing.

## 2022-10-09 NOTE — Consult Note (Addendum)
Cardiology Consultation   Patient ID: BRONX XING MRN: [id number]; DOB: 06-13-1924  Admit date: 10/09/2022 Date of Consult: 10/09/2022  PCP:  Pincus Sanes, MD   [facility] Providers Cardiologist:  Bryan Lemma, MD   {    Patient Profile:   Beverly Kim is a 89 y.o. female with a hx of chronic systolic and diastolic heart failure, persistent A fib, complete heart block s/p PPM, LBBB, CKD III, mitral regurgitation,  who is being seen 10/09/2022 for the evaluation of pre-op risk evaluation at the request of Dr Eloise Harman.   History of Present Illness:   Beverly Kim with above PMH presented to ER after a unwitnessed fall at home. She had immediate right hip pain after the fall. EMS was called and transferred her to ER. X-ray showed displaced fracture of the neck of right femur. CT brain/cervical spine/lumbar spine otherwise negative for acute findings. She was seen by ortho and recommended surgery, cardiology is consulted for pre-op risk evaluation today.   Labs today showed Na 130, K 3.3, Cr 1.51, BUN 33, gap 17, bicarb 20, eGFR 31. BNP 520.  Hs trop 36> 21 >29 >35. CBC unremarkable. EKG showed paced rhythm.    During encounter, anesthesia team is at bedside, transferring the patient to nerve block, unable obtain history from the patient directly. She is moaning in pain.  Her son and daughter at both present, daughter lives with the patient and is the primary care giver, assisted history. Daughter reports that patient is independent with ADLs on the baseline, able to shower herself, do laundry, and cook breakfast. She is able to walk indoor, unable climb stairs or walk up to a block, gets winded with exertional activity, has orthopnea and sleeps on a recliner. Her CHF symptoms had been stable and chronic for years. She did complaint difficulty with urination yesterday.  She has not complained any chest pain at home. She has been struggling with balance and felt dizzy at time for  several years. She lost her balance and fell this morning in her bedroom, unwitnessed, unclear if there was loss of consciousness. She was confused here due to receiving many medications per family perception.   Per chart review, she follows Dr Herbie Baltimore outpatient for chronic systolic and diastolic heart failure. Cardiomyopathy was felt due to tachycardia mediated and underlying conduction disease. Dry weight is around 145 ibs. She is on losartan, bisoprolol, and spironolactone for GDMT, on lasix and metolazone at baseline. Most recent Echo from 09/04/22 showed LVEF 40-45%, global hypokinesis, indeterminate diastolic, mod reduced RV, mod to severe MR, cannot r/o device lead impingement on TV leaflets, severe TR, aortic sclerosis. She had complete heart block and underwent [employer] PPM implant in 2018. Last device check 09/17/22 revealing normal device function, lead parameters and battery within normal limits, no significant events. She has CAD, cath from 2003 with 50-70% LAD. Last stress myoview 2013 was normal. She had no angina symptoms, last seen 01/02/22, doing well with more dyspnea on exertion, historically maintained on bisoprolol and eliquis, no antiplatelet due to use of DOAC. She has paroxysmal A fib, 45% burden from previous workup (per office note 01/02/22), goal is to rate control with beta blocker. Severe MR was reviewed with Dr Excell Seltzer in the past by Dr Herbie Baltimore, felt mitral clip would not add significant benefit.       Past Medical History:  Diagnosis Date   Acute blood loss anemia 09/04/2022   Cardiac pacemaker 05/2016  Sick sinus syndrome/complete heart block   Chronic heart failure with preserved ejection fraction (HFpEF) (HCC)    Colon polyps    Diverticulosis    Dyspnea    GERD (gastroesophageal reflux disease)    Guaiac positive stools 09/04/2022   Hearing loss of both ears    Hepatic cyst    Hiatal hernia    Hyperlipidemia    Hypertension    Hypothyroidism    Low back pain     Non-occlusive coronary artery disease 04/2001   Cardiac Cath 04/18/2001: 50 and 70% mid LAD after D1. = Nonischemic by Myoview.  Medical management.   Other left bundle branch block    PAF (paroxysmal atrial fibrillation) (HCC) 08/16/2016   observed on PPM interrogation, asymptomatic, chad2vasc score is 5.  Bisoprolol for rate control, Xarelto for anticoagulation.   Rectal bleeding 09/04/2022   URI (upper respiratory infection)     Past Surgical History:  Procedure Laterality Date   BIOPSY  09/05/2022   Procedure: BIOPSY;  Surgeon: Meridee Score Netty Starring., MD;  Location: [facility] ENDOSCOPY;  Service: Gastroenterology;;   CARDIOVERSION N/A 01/16/2019   Procedure: CARDIOVERSION;  Surgeon: Wendall Stade, MD;  Location: [facility] ENDOSCOPY;  Service: Cardiovascular;  Laterality: N/A;   CHOLECYSTECTOMY N/A 12/15/2019   Procedure: LAPAROSCOPIC CHOLECYSTECTOMY;  Surgeon: Almond Lint, MD;  Location: [facility] OR;  Service: General;  Laterality: N/A;   COLONOSCOPY     COLONOSCOPY N/A 09/05/2022   Procedure: COLONOSCOPY;  Surgeon: Lemar Lofty., MD;  Location: [facility] ENDOSCOPY;  Service: Gastroenterology;  Laterality: N/A;   ERCP N/A 07/21/2019   Procedure: ENDOSCOPIC RETROGRADE CHOLANGIOPANCREATOGRAPHY (ERCP);  Surgeon: Rachael Fee, MD;  Location: [facility] ENDOSCOPY;  Service: Endoscopy;  Laterality: N/A;   HERNIA REPAIR     HOT HEMOSTASIS N/A 09/05/2022   Procedure: HOT HEMOSTASIS (ARGON PLASMA COAGULATION/BICAP);  Surgeon: Lemar Lofty., MD;  Location: [facility] ENDOSCOPY;  Service: Gastroenterology;  Laterality: N/A;   LEFT HEART CATH AND CORONARY ANGIOGRAPHY  04/18/2001   Dr. Riley Kill: EF 60 to 65%.  2+ MR.  Upper LM takeoff with 90 degree bend just inside the ostium.  50-70% mid LAD after D1.  D1 is 30%.    Distal LAD wraps the apex and is free of disease.  LCx is mostly large OM branch.  RCA provides PDA and PL branches and Free of disease. -->  Presumably evaluated by Myoview that was nonischemic,  therefore no PCI performed.   NM MYOVIEW LTD  01/2012   Normal EF.  Normal study.  No ischemia or infarction.   PACEMAKER IMPLANT Left 05/15/2016   SJM Assirity MRI dual chamber PPM implanted by Dr Johney Frame for CHB   POLYPECTOMY  09/05/2022   Procedure: POLYPECTOMY;  Surgeon: Mansouraty, Netty Starring., MD;  Location: [facility] ENDOSCOPY;  Service: Gastroenterology;;   REMOVAL OF STONES  07/21/2019   Procedure: REMOVAL OF STONES;  Surgeon: Rachael Fee, MD;  Location: [facility] ENDOSCOPY;  Service: Endoscopy;;   SPHINCTEROTOMY  07/21/2019   Procedure: Dennison Mascot;  Surgeon: Rachael Fee, MD;  Location: [facility] ENDOSCOPY;  Service: Endoscopy;;   THYROIDECTOMY     TONSILLECTOMY AND ADENOIDECTOMY     TOTAL ABDOMINAL HYSTERECTOMY W/ BILATERAL SALPINGOOPHORECTOMY     TRANSTHORACIC ECHOCARDIOGRAM  06/27/2016   Moderate basal-septal LVH, EF 60 to 65%.  No R WMA.  GR 1 DD.  Mild aortic valve calcification.  No AS.  Trivial MR.--Indicated improved EF compared to prior study in 2015.   TRANSTHORACIC ECHOCARDIOGRAM  02/22/2012   12/'13 -a)  EF 50 and 55%.  Normal function.-Moderate MR.  Mild LA dilation.; b) 11/2015Echo for shortness of breath => reduced EF of 45 to 50%.  Mild LVH.  Inferior reversible HK.  GRII DD.  Moderate MR.   TRANSTHORACIC ECHOCARDIOGRAM  10/2019   EF 60 -65%.  No RWMA.  Severe concentric LVH.  Unable to assess diastolic function.  Mild biatrial dilation.  Moderate MR moderate TR, no AS   TRANSTHORACIC ECHOCARDIOGRAM  06/15/2021   EF 40 to 45%.  Paradoxical septal motion from pacemaker.  Mild concentric LVH.  Indeterminate diastolic pressure.  Severe biatrial enlargement would argue significant diastolic dysfunction.  Severe MR and severe TR. => EF down from 60 to 65%.     Home Medications:  Prior to Admission medications   Medication Sig Start Date End Date Taking? Authorizing Provider  acetaminophen (TYLENOL) 650 MG CR tablet Take 650 mg by mouth every 8 (eight) hours as needed for pain.    Yes [provider]  albuterol (VENTOLIN HFA) 108 (90 Base) MCG/ACT inhaler Inhale 1-2 puffs into the lungs every 6 (six) hours as needed for wheezing or shortness of breath. 05/31/22  Yes Burns, Bobette Mo, MD  bisoprolol (ZEBETA) 5 MG tablet Take 0.5 tablets (2.5 mg total) by mouth daily. 02/14/22  Yes Marykay Lex, MD  furosemide (LASIX) 40 MG tablet TAKE 1 TABLET BY MOUTH EVERY DAY Patient taking differently: Take 40 mg by mouth daily. 04/30/22  Yes Marykay Lex, MD  metolazone (ZAROXOLYN) 2.5 MG tablet TAKE 1 TABLET 3 TIMES WEEKLY (Monday,  Wednesday, Saturday) Patient taking differently: Take 2.5 mg by mouth See admin instructions. TAKE 1 TABLET 3 TIMES WEEKLY (Monday, Wednesday, Saturday) 01/02/22  Yes Marykay Lex, MD  polyethylene glycol (MIRALAX / GLYCOLAX) 17 g packet Take 17 g by mouth daily.   Yes [provider]  potassium chloride (KLOR-CON) 10 MEQ tablet TAKE 1 TABLET ( 10 MEQ total) BY MOUTH ONCE DAILY ON  DAYS  METOLAZONE  IS  TAKEN Patient taking differently: Take 10 mEq by mouth See admin instructions. Take 1 tablet by mouth every Monday, Wednesday, Saturday 10/25/21  Yes Marykay Lex, MD  promethazine (PHENERGAN) 12.5 MG tablet Take 0.5 tablets (6.25 mg total) by mouth every 6 (six) hours as needed for nausea or vomiting. 09/17/22  Yes Crain, Whitney L, PA  Sennosides (SENOKOT EXTRA STRENGTH) 17.2 MG TABS Take 34.4 mg by mouth daily. Take 2 tablets by mouth daily   Yes [provider]  SYNTHROID 112 MCG tablet Take 1 tablet (112 mcg total) by mouth daily before breakfast. 05/09/22  Yes Burns, Bobette Mo, MD  traZODone (DESYREL) 50 MG tablet TAKE 1/2 TO 1 TABLET (25 TO 50 MG TOTAL) BY MOUTH AT BEDTIME Patient taking differently: Take 50 mg by mouth at bedtime. 10/05/22  Yes Burns, Bobette Mo, MD  ANUCORT-HC 25 MG suppository Place 1 suppository (25 mg total) rectally 2 (two) times daily as needed for hemorrhoids or anal itching. Patient not taking:  Reported on 10/03/2022 09/27/22   Guy Sandifer L, PA  apixaban (ELIQUIS) 5 MG TABS tablet Take 1 tablet (5 mg total) by mouth 2 (two) times daily. Patient not taking: Reported on 09/25/2022 09/08/22   [name], MD  ondansetron (ZOFRAN-ODT) 4 MG disintegrating tablet Take 1 tablet (4 mg total) by mouth every 8 (eight) hours as needed for nausea or vomiting. Patient not taking: Reported on 10/03/2022 09/21/22   Corwin Levins, MD  pantoprazole (PROTONIX)  40 MG tablet TAKE 1 TABLET BY MOUTH EVERY DAY Patient not taking: Reported on 10/09/2022 06/04/22   Doree Albee, PA-C    Inpatient Medications: Scheduled Meds:  acetaminophen  1,000 mg Oral TID   bisoprolol  2.5 mg Oral Daily   furosemide  40 mg Oral Daily   heparin  5,000 Units Subcutaneous Q8H   insulin aspart  0-15 Units Subcutaneous TID WC   [START ON 10/10/2022] levothyroxine  112 mcg Oral QAC breakfast   lidocaine  1 patch Transdermal Q24H   [START ON 10/10/2022] metolazone  2.5 mg Oral Once per day on Monday Wednesday Saturday   polyethylene glycol  17 g Oral Daily   [START ON 10/10/2022] potassium chloride  10 mEq Oral Once per day on Monday Wednesday Saturday   senna  17.2 mg Oral Daily   traMADol  50 mg Oral Q6H   traZODone  50 mg Oral QHS   Continuous Infusions:  sodium chloride     PRN Meds: albuterol, HYDROmorphone (DILAUDID) injection  Allergies:    Allergies  Allergen Reactions   Adhesive [Tape] Itching, Dermatitis, Rash and Other (See Comments)    Blisters and "skin bubbles"   Ace Inhibitors Cough   Atorvastatin Other (See Comments)    REACTION: Reaction not known   Clindamycin Other (See Comments)    Unknown   Codeine Other (See Comments)    hallucinations    Latex Other (See Comments)    blisters   Shellfish Allergy Other (See Comments)    "gallbladder attack"   Simvastatin Other (See Comments)    fatigue   Penicillins Rash    Social History:   Social History   Socioeconomic History   Marital  status: Widowed    Spouse name: Not on file   Number of children: 4   Years of education: Not on file   Highest education level: Not on file  Occupational History   Occupation: retired  Tobacco Use   Smoking status: Never   Smokeless tobacco: Never  Vaping Use   Vaping status: Never Used  Substance and Sexual Activity   Alcohol use: No   Drug use: No   Sexual activity: Never  Other Topics Concern   Not on file  Social History Narrative   Not on file   Social Determinants of Health   Financial Resource Strain: Low Risk  (09/11/2019)   Overall Financial Resource Strain (CARDIA)    Difficulty of Paying Living Expenses: Not hard at all  Food Insecurity: No Food Insecurity (09/10/2022)   Hunger Vital Sign    Worried About Running Out of Food in the Last Year: Never true    Ran Out of Food in the Last Year: Never true  Transportation Needs: No Transportation Needs (09/10/2022)   PRAPARE - Administrator, Civil Service (Medical): No    Lack of Transportation (Non-Medical): No  Physical Activity: Sufficiently Active (08/19/2018)   Exercise Vital Sign    Days of Exercise per Week: 5 days    Minutes of Exercise per Session: 40 min  Stress: No Stress Concern Present (09/11/2019)   [employer] of Occupational Health - Occupational Stress Questionnaire    Feeling of Stress : Not at all  Social Connections: Moderately Integrated (09/11/2019)   Social Connection and Isolation Panel [NHANES]    Frequency of Communication with Friends and Family: More than three times a week    Frequency of Social Gatherings with Friends and Family: More than three times  a week    Attends Religious Services: More than 4 times per year    Active Member of Clubs or Organizations: Yes    Attends Banker Meetings: More than 4 times per year    Marital Status: Widowed  Intimate Partner Violence: Patient Declined (09/03/2022)   Humiliation, Afraid, Rape, and Kick questionnaire    Fear of  Current or Ex-Partner: Patient declined    Emotionally Abused: Patient declined    Physically Abused: Patient declined    Sexually Abused: Patient declined    Family History:    Family History  Problem Relation Age of Onset   Coronary artery disease Other        family hx of 1st degree relative <50   Diabetes Other        family hx of   Other Other        cardiovascular disorder family hx of   Other Other        neurological disorder family hx of   Other Other        respiratory disease family hx of   Heart attack Daughter    Heart Problems Other        all children   Sudden death Son    Heart disease Brother    Heart disease Sister    Colon cancer Neg Hx    Colon polyps Neg Hx      ROS:  Unreliable due to confusion    Physical Exam/Data:   Vitals:   10/09/22 1145 10/09/22 1216 10/09/22 1315 10/09/22 1330  BP: (!) 133/53 (!) 154/58 (!) 144/49 (!) 125/45  Pulse: 60 64 60 64  Resp: 14 18 16 13   Temp:  97.6 F (36.4 C)    TempSrc:      SpO2: 99% 92% 98% 98%   No intake or output data in the 24 hours ending 10/09/22 1408    10/03/2022    7:22 PM 09/12/2022    8:07 AM 09/07/2022    4:35 AM  Last 3 Weights  Weight (lbs) 158 lb 11.7 oz 156 lb 159 lb 9.8 oz  Weight (kg) 72 kg 70.761 kg 72.4 kg     There is no height or weight on file to calculate BMI.   Physical exam"   Alert and oriented to self, cognitively impair  Uncomfortable due to hip pain Breathing comfortably at rest, on room air  Additional exam unable complete as patient was transferred out of the room, see attending addendum   EKG:  The EKG was personally reviewed and demonstrates:    Paced rhythm   Telemetry:  Telemetry was personally reviewed and demonstrates:  N/A   Relevant CV Studies:   Echo 09/04/22:    1. Global hypokinesis abnoraml septal motion worse at inferior base. Left  ventricular ejection fraction, by estimation, is 40 to 45%. The left  ventricle has mildly decreased function.  The left ventricle demonstrates  global hypokinesis. The left  ventricular internal cavity size was mildly dilated. Left ventricular  diastolic parameters are indeterminate.   2. Device lead in RA/RV . Right ventricular systolic function is  moderately reduced. The right ventricular size is normal.   3. Left atrial size was moderately dilated.   4. Right atrial size was moderately dilated.   5. The mitral valve is normal in structure. Moderate to severe mitral  valve regurgitation. No evidence of mitral stenosis.   6. Cannot r/o device lead impingement on TV leaflets as cause for  severe  TR No 3D short axis images done . Tricuspid valve regurgitation is severe.   7. The aortic valve is tricuspid. There is mild calcification of the  aortic valve. There is mild thickening of the aortic valve. Aortic valve  regurgitation is not visualized. Aortic valve sclerosis is present, with  no evidence of aortic valve stenosis.   8. The inferior vena cava is dilated in size with <50% respiratory  variability, suggesting right atrial pressure of 15 mmHg.    Laboratory Data:  High Sensitivity Troponin:   Recent Labs  Lab 10/03/22 1712 10/03/22 1902 10/09/22 0650 10/09/22 0934  TROPONINIHS 36* 21* 29* 35*     Chemistry Recent Labs  Lab 10/03/22 1712 10/09/22 0650  NA 133* 130*  K 3.5 3.3*  CL 97* 93*  CO2 22 20*  GLUCOSE 105* 124*  BUN 31* 33*  CREATININE 1.35* 1.51*  CALCIUM 9.4 9.4  GFRNONAA 36* 31*  ANIONGAP 14 17*    Recent Labs  Lab 10/03/22 1712  PROT 6.9  ALBUMIN 3.9  AST 32  ALT 17  ALKPHOS 64  BILITOT 1.4*   Lipids No results for input(s): "CHOL", "TRIG", "HDL", "LABVLDL", "LDLCALC", "CHOLHDL" in the last 168 hours.  Hematology Recent Labs  Lab 10/03/22 1712 10/09/22 0650  WBC 6.0 6.0  RBC 3.49* 3.71*  HGB 11.5* 12.0  HCT 35.1* 36.5  MCV 100.6* 98.4  MCH 33.0 32.3  MCHC 32.8 32.9  RDW 14.6 14.5  PLT 143* 151   Thyroid No results for input(s): "TSH",  "FREET4" in the last 168 hours.  BNP Recent Labs  Lab 10/03/22 1712  BNP 520.0*    DDimer No results for input(s): "DDIMER" in the last 168 hours.   Radiology/Studies:  CT Head Wo Contrast  Result Date: 10/09/2022 CLINICAL DATA:  89 year old female history of trauma from a fall. Head and neck pain. EXAM: CT HEAD WITHOUT CONTRAST CT CERVICAL SPINE WITHOUT CONTRAST TECHNIQUE: Multidetector CT imaging of the head and cervical spine was performed following the standard protocol without intravenous contrast. Multiplanar CT image reconstructions of the cervical spine were also generated. RADIATION DOSE REDUCTION: This exam was performed according to the departmental dose-optimization program which includes automated exposure control, adjustment of the mA and/or kV according to patient size and/or use of iterative reconstruction technique. COMPARISON:  Head CT 10/03/2022.  No prior cervical spine CT. FINDINGS: CT HEAD FINDINGS Brain: Mild cerebral atrophy. Patchy and confluent areas of decreased attenuation are noted throughout the deep and periventricular white matter of the cerebral hemispheres bilaterally, compatible with chronic microvascular ischemic disease. No evidence of acute infarction, hemorrhage, hydrocephalus, extra-axial collection or mass lesion/mass effect. Vascular: No hyperdense vessel or unexpected calcification. Skull: Normal. Negative for fracture or focal lesion. Sinuses/Orbits: No acute finding. Other: Small left mastoid effusion, unchanged. CT CERVICAL SPINE FINDINGS Comment: Study is limited by considerable patient motion. Alignment: Normal. Skull base and vertebrae: No acute fracture. No primary bone lesion or focal pathologic process. Soft tissues and spinal canal: No prevertebral fluid or swelling. No visible canal hematoma. Disc levels: Multilevel degenerative disc disease, most pronounced at C5-C6. Moderate multilevel facet arthropathy bilaterally. Upper chest: Negative. Other:  None. IMPRESSION: 1. No evidence of significant acute traumatic injury to the skull, brain or cervical spine. 2. Mild cerebral atrophy with chronic microvascular ischemic changes in the cerebral white matter, as above. 3. Multilevel degenerative disc disease and cervical spondylosis, as above. Electronically Signed   By: Trudie Reed M.D.   On: 10/09/2022  08:08   CT Cervical Spine Wo Contrast  Result Date: 10/09/2022 CLINICAL DATA:  89 year old female history of trauma from a fall. Head and neck pain. EXAM: CT HEAD WITHOUT CONTRAST CT CERVICAL SPINE WITHOUT CONTRAST TECHNIQUE: Multidetector CT imaging of the head and cervical spine was performed following the standard protocol without intravenous contrast. Multiplanar CT image reconstructions of the cervical spine were also generated. RADIATION DOSE REDUCTION: This exam was performed according to the departmental dose-optimization program which includes automated exposure control, adjustment of the mA and/or kV according to patient size and/or use of iterative reconstruction technique. COMPARISON:  Head CT 10/03/2022.  No prior cervical spine CT. FINDINGS: CT HEAD FINDINGS Brain: Mild cerebral atrophy. Patchy and confluent areas of decreased attenuation are noted throughout the deep and periventricular white matter of the cerebral hemispheres bilaterally, compatible with chronic microvascular ischemic disease. No evidence of acute infarction, hemorrhage, hydrocephalus, extra-axial collection or mass lesion/mass effect. Vascular: No hyperdense vessel or unexpected calcification. Skull: Normal. Negative for fracture or focal lesion. Sinuses/Orbits: No acute finding. Other: Small left mastoid effusion, unchanged. CT CERVICAL SPINE FINDINGS Comment: Study is limited by considerable patient motion. Alignment: Normal. Skull base and vertebrae: No acute fracture. No primary bone lesion or focal pathologic process. Soft tissues and spinal canal: No prevertebral fluid  or swelling. No visible canal hematoma. Disc levels: Multilevel degenerative disc disease, most pronounced at C5-C6. Moderate multilevel facet arthropathy bilaterally. Upper chest: Negative. Other: None. IMPRESSION: 1. No evidence of significant acute traumatic injury to the skull, brain or cervical spine. 2. Mild cerebral atrophy with chronic microvascular ischemic changes in the cerebral white matter, as above. 3. Multilevel degenerative disc disease and cervical spondylosis, as above. Electronically Signed   By: Trudie Reed M.D.   On: 10/09/2022 08:08   CT Lumbar Spine Wo Contrast  Result Date: 10/09/2022 CLINICAL DATA:  Back trauma, no prior imaging (Age >= 16y) EXAM: CT LUMBAR SPINE WITHOUT CONTRAST TECHNIQUE: Multidetector CT imaging of the lumbar spine was performed without intravenous contrast administration. Multiplanar CT image reconstructions were also generated. RADIATION DOSE REDUCTION: This exam was performed according to the departmental dose-optimization program which includes automated exposure control, adjustment of the mA and/or kV according to patient size and/or use of iterative reconstruction technique. COMPARISON:  BCT AP 10/03/22 FINDINGS: Segmentation: 5 lumbar type vertebrae. Alignment: Trace retrolisthesis of T12-L1. Grade 1 anterolisthesis of L4 on L5. Grade 1-2 anterolisthesis of on S1. Vertebrae: Vertebral body heights are maintained. No focal pathologic process. Paraspinal and other soft tissues: Bibasilar atelectasis. Aortic atherosclerotic calcifications. Status post cholecystectomy. No evidence of hydronephrosis or nephrolithiasis. Compared to prior exam there is increased fluid in the right lower quadrant, which is nonspecific and may be reactive. Disc levels: There is no evidence of high-grade spinal canal stenosis. Moderate bilateral foraminal narrowing at L5-S1. There is degenerative fusion of the L4-L5 and L5-S1 facet joints. IMPRESSION: 1. No acute fracture or traumatic  listhesis. 2. Compared to prior exam there is increased fluid in the right lower quadrant, which is nonspecific and may be reactive Aortic Atherosclerosis (ICD10-I70.0). Electronically Signed   By: Lorenza Cambridge M.D.   On: 10/09/2022 08:08   DG Pelvis 1-2 Views  Result Date: 10/09/2022 CLINICAL DATA:  Trauma, fall EXAM: PELVIS - 1-2 VIEW COMPARISON:  05/04/2003 FINDINGS: There is a displaced fracture in the neck of right femur. There is superior displacement of distal major fracture fragment. There is no dislocation in the right hip. Arterial calcifications are seen in soft tissues.  IMPRESSION: Displaced fracture is seen in the neck of right femur. Electronically Signed   By: Ernie Avena M.D.   On: 10/09/2022 07:47   DG FEMUR, MIN 2 VIEWS RIGHT  Result Date: 10/09/2022 CLINICAL DATA:  Trauma, fall EXAM: RIGHT FEMUR 2 VIEWS COMPARISON:  None FINDINGS: There is fracture in the subcapital portion of neck of right femur. There is superior displacement of distal fracture fragment. There is no dislocation in the hip. Degenerative changes are noted in the right knee with bony spurs. Arterial calcifications are seen in the soft tissues. IMPRESSION: Displaced fracture is seen in the neck of right femur. Degenerative changes are noted in the right knee.  Arteriosclerosis. Electronically Signed   By: Ernie Avena M.D.   On: 10/09/2022 07:46     Assessment and Plan:   Pre-op risk evaluation  - RCRI score is 2, 6.6% perioperative risk of major cardiac events  - DASI 7.2, 3.63 METS - given advanced age, functional capacity <4 METS, complex comorbidity, risk is moderate to high for planned surgery of right hip, consider correct electrolyte deficiency and treat UTI (c/o oliguria since yesterday) before OR   CAD  - last NM stress normal 2013, known 50-70s LAD from cath 2003  - she has no angina symptoms at home  - Echo from 09/04/22 with LVEF 40-45%, similar to 2023 Echo - Hs trop 36> 21 >29 >35, not  consistent with ACS, EKG paced - would not obtain additional workup to delay surgery at this time   Persistent A fib Hx of complete heart block - PPM device interrogated today, battery 2.6-3.1 yrs, AP <1%, VP 97%, AMS entry 33 counts,  AF burden 56%, high ventricular rate detected once since 07/26/21 - on PTA eliquis, held for pre-op, no bridge required   Chronic systolic and diastolic heart failure   Chronic severe MR and TR  - euvolemic on exam  - chronic DOE and orthopnea unchanged from baseline  - would monitor I&O and daily weight, diuresis if volume overload occurs  - GDMT: on PTA bisoprolol 2.5mg  daily, advanced CKD, not gonna add ARNI/MRA/SGLT2I, ok to hold PTA lasix and metolazone if lacking PO intake   Right hip fracture  Hypokalemia Oliguria  CKD III Confusion Hypothyroidism - per primary team      Risk Assessment/Risk Scores:   New York Heart Association (NYHA) Functional Class NYHA Class III  CHA2DS2-VASc Score = 5   This indicates a 7.2% annual risk of stroke. The patient's score is based upon: CHF History: 1 HTN History: 0 Diabetes History: 0 Stroke History: 0 Vascular Disease History: 1 Age Score: 2 Gender Score: 1   For questions or updates, please contact [facility] Please consult [email or web address] for contact info under    Signed, Cyndi Bender, NP  10/09/2022 2:08 PM   Agree with note by Cyndi Bender NP  We were asked to see this 89 year old female for preoperative clearance before ORIF.  She apparently fractured her hip from a mechanical fall.  She lives with her daughter.  She is a patient Dr. Elissa Hefty.  She does have moderate LV dysfunction with diastolic heart failure and valvular disease as well as moderate CAD.  Because of the persistent A-fib not on oral anticoagulation status post pacemaker implantation.  She is hard of hearing.  We talked about risks of surgery versus not performing surgery.  Her son and daughter were in the room.  I  think she is at moderate cardiovascular risk given his  age, LV dysfunction and known CAD although the risk is prohibitive.  I favor clearing her for surgery.  Will be available if there are any cardiovascular issues postop.  Runell Gess, M.D., FACP, [facility], [facility] [address] [place], [place]  [address]  [phone] 10/09/2022 3:02 PM

## 2022-10-09 NOTE — Anesthesia Procedure Notes (Signed)
Anesthesia Regional Block: Femoral nerve block   Pre-Anesthetic Checklist: , timeout performed,  Correct Patient, Correct Site, Correct Laterality,  Correct Procedure, Correct Position, site marked,  Risks and benefits discussed,  Pre-op evaluation,  At surgeon's request and post-op pain management  Laterality: Right  Prep: Maximum Sterile Barrier Precautions used, chloraprep       Needles:  Injection technique: Single-shot  Needle Type: Echogenic Stimulator Needle     Needle Length: 9cm  Needle Gauge: 22     Additional Needles:   Procedures:,,,, ultrasound used (permanent image in chart),,    Narrative:  Start time: 10/09/2022 1:33 PM End time: 10/09/2022 1:37 PM Injection made incrementally with aspirations every 5 mL.  Performed by: Personally  Anesthesiologist: Kaylyn Layer, MD  Additional Notes: Risks, benefits, and alternative discussed. Patient gave consent for procedure. Patient prepped and draped in sterile fashion. Sedation administered, patient remains easily responsive to voice. Relevant anatomy identified with ultrasound guidance. Local anesthetic given in 5cc increments with no signs or symptoms of intravascular injection. No pain or paraesthesias with injection. Patient monitored throughout procedure with signs of LAST or immediate complications. Tolerated well. Ultrasound image placed in chart.  Amalia Greenhouse, MD

## 2022-10-09 NOTE — Assessment & Plan Note (Signed)
BP well controlled at time of admission.  Plan Continue home medications

## 2022-10-09 NOTE — Assessment & Plan Note (Signed)
Patient appears stable w/o signs of decompensation.  Plan Continue home regimen

## 2022-10-09 NOTE — H&P (Signed)
History and Physical    Beverly Kim WUJ:[id number] DOB: Jul 20, 1924 DOA: 10/09/2022  DOS: the patient was seen and examined on 10/09/2022  PCP: Pincus Sanes, MD   Patient coming from: Home  I have personally briefly reviewed patient's old medical records in [facility]   Beverly Kim a 89 yo F with hx of AF not on AC (recently stopped Eliquis), non-occlusive CAD, CHB, CKD, CHF and [employer] pacemaker who had an unwitnessed fall at home. After the fall she was complaining of right hip pain and back pain unable to bear weight. EMS activated. Family  reported to EMS that the patient felt dizzy and fell to the ground. Patient transported to [facility] for evaluation. She was given fentanyl for pain while in transit.    ED Course: T 97.8  133/56  HR 65  RR 20. Elderly overweight woman in pain, disoriented and confused secondary to pain medications. Lab: K 3.3, glucose 124, Cr 1.5 (at baseline) AG 17, CBCD nl. X-ray right hip - displaced fracture right femoral neck.   Review of Systems:  Review of Systems  Unable to perform ROS: Mental acuity    Past Medical History:  Diagnosis Date   Acute blood loss anemia 09/04/2022   Cardiac pacemaker 05/2016   Sick sinus syndrome/complete heart block   Chronic heart failure with preserved ejection fraction (HFpEF) (HCC)    Colon polyps    Diverticulosis    Dyspnea    GERD (gastroesophageal reflux disease)    Guaiac positive stools 09/04/2022   Hearing loss of both ears    Hepatic cyst    Hiatal hernia    Hyperlipidemia    Hypertension    Hypothyroidism    Low back pain    Non-occlusive coronary artery disease 04/2001   Cardiac Cath 04/18/2001: 50 and 70% mid LAD after D1. = Nonischemic by Myoview.  Medical management.   Other left bundle branch block    PAF (paroxysmal atrial fibrillation) (HCC) 08/16/2016   observed on PPM interrogation, asymptomatic, chad2vasc score is 5.  Bisoprolol for rate control, Xarelto for anticoagulation.    Rectal bleeding 09/04/2022   URI (upper respiratory infection)     Past Surgical History:  Procedure Laterality Date   BIOPSY  09/05/2022   Procedure: BIOPSY;  Surgeon: Meridee Score Netty Starring., MD;  Location: [facility] ENDOSCOPY;  Service: Gastroenterology;;   CARDIOVERSION N/A 01/16/2019   Procedure: CARDIOVERSION;  Surgeon: Wendall Stade, MD;  Location: [facility] ENDOSCOPY;  Service: Cardiovascular;  Laterality: N/A;   CHOLECYSTECTOMY N/A 12/15/2019   Procedure: LAPAROSCOPIC CHOLECYSTECTOMY;  Surgeon: Almond Lint, MD;  Location: [facility] OR;  Service: General;  Laterality: N/A;   COLONOSCOPY     COLONOSCOPY N/A 09/05/2022   Procedure: COLONOSCOPY;  Surgeon: Lemar Lofty., MD;  Location: [facility] ENDOSCOPY;  Service: Gastroenterology;  Laterality: N/A;   ERCP N/A 07/21/2019   Procedure: ENDOSCOPIC RETROGRADE CHOLANGIOPANCREATOGRAPHY (ERCP);  Surgeon: Rachael Fee, MD;  Location: [facility] ENDOSCOPY;  Service: Endoscopy;  Laterality: N/A;   HERNIA REPAIR     HOT HEMOSTASIS N/A 09/05/2022   Procedure: HOT HEMOSTASIS (ARGON PLASMA COAGULATION/BICAP);  Surgeon: Lemar Lofty., MD;  Location: [facility] ENDOSCOPY;  Service: Gastroenterology;  Laterality: N/A;   LEFT HEART CATH AND CORONARY ANGIOGRAPHY  04/18/2001   Dr. Riley Kill: EF 60 to 65%.  2+ MR.  Upper LM takeoff with 90 degree bend just inside the ostium.  50-70% mid LAD after D1.  D1 is 30%.    Distal LAD wraps the  apex and is free of disease.  LCx is mostly large OM branch.  RCA provides PDA and PL branches and Free of disease. -->  Presumably evaluated by Myoview that was nonischemic, therefore no PCI performed.   NM MYOVIEW LTD  01/2012   Normal EF.  Normal study.  No ischemia or infarction.   PACEMAKER IMPLANT Left 05/15/2016   SJM Assirity MRI dual chamber PPM implanted by Dr Johney Frame for CHB   POLYPECTOMY  09/05/2022   Procedure: POLYPECTOMY;  Surgeon: Mansouraty, Netty Starring., MD;  Location: [facility] ENDOSCOPY;  Service: Gastroenterology;;   REMOVAL OF STONES   07/21/2019   Procedure: REMOVAL OF STONES;  Surgeon: Rachael Fee, MD;  Location: [facility] ENDOSCOPY;  Service: Endoscopy;;   SPHINCTEROTOMY  07/21/2019   Procedure: Dennison Mascot;  Surgeon: Rachael Fee, MD;  Location: [facility] ENDOSCOPY;  Service: Endoscopy;;   THYROIDECTOMY     TONSILLECTOMY AND ADENOIDECTOMY     TOTAL ABDOMINAL HYSTERECTOMY W/ BILATERAL SALPINGOOPHORECTOMY     TRANSTHORACIC ECHOCARDIOGRAM  06/27/2016   Moderate basal-septal LVH, EF 60 to 65%.  No R WMA.  GR 1 DD.  Mild aortic valve calcification.  No AS.  Trivial MR.--Indicated improved EF compared to prior study in 2015.   TRANSTHORACIC ECHOCARDIOGRAM  02/22/2012   12/'13 -a) EF 50 and 55%.  Normal function.-Moderate MR.  Mild LA dilation.; b) 11/2015Echo for shortness of breath => reduced EF of 45 to 50%.  Mild LVH.  Inferior reversible HK.  GRII DD.  Moderate MR.   TRANSTHORACIC ECHOCARDIOGRAM  10/2019   EF 60 -65%.  No RWMA.  Severe concentric LVH.  Unable to assess diastolic function.  Mild biatrial dilation.  Moderate MR moderate TR, no AS   TRANSTHORACIC ECHOCARDIOGRAM  06/15/2021   EF 40 to 45%.  Paradoxical septal motion from pacemaker.  Mild concentric LVH.  Indeterminate diastolic pressure.  Severe biatrial enlargement would argue significant diastolic dysfunction.  Severe MR and severe TR. => EF down from 60 to 65%.    Soc Hx - widowed. Had 3 sons - two deceased, 1 daughter. Retired Customer service manager. Lives with daughter. Gets meals on wheels and has in-home assist. Per daughter - patient has clearly stated she would not want cardiac resuscitation from cardiac arrest and would not want intubation for respiratory arrest.    reports that she has never smoked. She has never used smokeless tobacco. She reports that she does not drink alcohol and does not use drugs.  Allergies  Allergen Reactions   Adhesive [Tape] Itching, Dermatitis, Rash and Other (See Comments)    Blisters and "skin bubbles"   Ace Inhibitors Cough    Atorvastatin Other (See Comments)    REACTION: Reaction not known   Clindamycin Other (See Comments)    Unknown   Codeine Other (See Comments)    hallucinations    Latex Other (See Comments)    blisters   Shellfish Allergy Other (See Comments)    "gallbladder attack"   Simvastatin Other (See Comments)    fatigue   Penicillins Rash    Family History  Problem Relation Age of Onset   Coronary artery disease Other        family hx of 1st degree relative <50   Diabetes Other        family hx of   Other Other        cardiovascular disorder family hx of   Other Other        neurological disorder family hx of  Other Other        respiratory disease family hx of   Heart attack Daughter    Heart Problems Other        all children   Sudden death Son    Heart disease Brother    Heart disease Sister    Colon cancer Neg Hx    Colon polyps Neg Hx     Prior to Admission medications   Medication Sig Start Date End Date Taking? Authorizing Provider  acetaminophen (TYLENOL) 650 MG CR tablet Take 650 mg by mouth every 8 (eight) hours as needed for pain.   Yes [provider]  albuterol (VENTOLIN HFA) 108 (90 Base) MCG/ACT inhaler Inhale 1-2 puffs into the lungs every 6 (six) hours as needed for wheezing or shortness of breath. 05/31/22  Yes Burns, Bobette Mo, MD  bisoprolol (ZEBETA) 5 MG tablet Take 0.5 tablets (2.5 mg total) by mouth daily. 02/14/22  Yes Marykay Lex, MD  furosemide (LASIX) 40 MG tablet TAKE 1 TABLET BY MOUTH EVERY DAY Patient taking differently: Take 40 mg by mouth daily. 04/30/22  Yes Marykay Lex, MD  metolazone (ZAROXOLYN) 2.5 MG tablet TAKE 1 TABLET 3 TIMES WEEKLY (Monday,  Wednesday, Saturday) Patient taking differently: Take 2.5 mg by mouth See admin instructions. TAKE 1 TABLET 3 TIMES WEEKLY (Monday, Wednesday, Saturday) 01/02/22  Yes Marykay Lex, MD  polyethylene glycol (MIRALAX / GLYCOLAX) 17 g packet Take 17 g by mouth daily.   Yes [provider]  potassium chloride (KLOR-CON) 10 MEQ tablet TAKE 1 TABLET ( 10 MEQ total) BY MOUTH ONCE DAILY ON  DAYS  METOLAZONE  IS  TAKEN Patient taking differently: Take 10 mEq by mouth See admin instructions. Take 1 tablet by mouth every Monday, Wednesday, Saturday 10/25/21  Yes Marykay Lex, MD  promethazine (PHENERGAN) 12.5 MG tablet Take 0.5 tablets (6.25 mg total) by mouth every 6 (six) hours as needed for nausea or vomiting. 09/17/22  Yes Crain, Whitney L, PA  Sennosides (SENOKOT EXTRA STRENGTH) 17.2 MG TABS Take 34.4 mg by mouth daily. Take 2 tablets by mouth daily   Yes [provider]  SYNTHROID 112 MCG tablet Take 1 tablet (112 mcg total) by mouth daily before breakfast. 05/09/22  Yes Burns, Bobette Mo, MD  traZODone (DESYREL) 50 MG tablet TAKE 1/2 TO 1 TABLET (25 TO 50 MG TOTAL) BY MOUTH AT BEDTIME Patient taking differently: Take 50 mg by mouth at bedtime. 10/05/22  Yes Burns, Bobette Mo, MD  ANUCORT-HC 25 MG suppository Place 1 suppository (25 mg total) rectally 2 (two) times daily as needed for hemorrhoids or anal itching. Patient not taking: Reported on 10/03/2022 09/27/22   Guy Sandifer L, PA  apixaban (ELIQUIS) 5 MG TABS tablet Take 1 tablet (5 mg total) by mouth 2 (two) times daily. Patient not taking: Reported on 09/25/2022 09/08/22   [name], MD  ondansetron (ZOFRAN-ODT) 4 MG disintegrating tablet Take 1 tablet (4 mg total) by mouth every 8 (eight) hours as needed for nausea or vomiting. Patient not taking: Reported on 10/03/2022 09/21/22   Corwin Levins, MD  pantoprazole (PROTONIX) 40 MG tablet TAKE 1 TABLET BY MOUTH EVERY DAY Patient not taking: Reported on 10/09/2022 06/04/22   Doree Albee, [place]    Physical Exam: Vitals:   10/09/22 0910 10/09/22 1030 10/09/22 1100 10/09/22 1107  BP:  (!) 138/55 (!) 142/55 (!) 152/51  Pulse: 65 60 (!) 59 60  Resp: 20 (!) 26  15 18  Temp:    97.9 F (36.6 C)  TempSrc:    Oral  SpO2: 100% 100% 99% 98%    Physical  Exam Vitals and nursing note reviewed.  Constitutional:      General: She is in acute distress.     Appearance: She is obese. She is not ill-appearing or toxic-appearing.     Comments: Patient semi-obtunded secondary to pain medications.  HENT:     Head: Normocephalic and atraumatic.     Nose: Nose normal.     Mouth/Throat:     Mouth: Mucous membranes are moist.     Pharynx: Oropharynx is clear. No oropharyngeal exudate.  Eyes:     Extraocular Movements: Extraocular movements intact.     Conjunctiva/sclera: Conjunctivae normal.     Pupils: Pupils are equal, round, and reactive to light.  Cardiovascular:     Rate and Rhythm: Normal rate and regular rhythm.     Pulses: Normal pulses.     Heart sounds: Normal heart sounds.  Pulmonary:     Effort: Pulmonary effort is normal.     Breath sounds: Normal breath sounds.  Abdominal:     General: Bowel sounds are normal.     Palpations: Abdomen is soft.  Musculoskeletal:     Cervical back: Normal range of motion and neck supple.     Right lower leg: Edema present.     Left lower leg: Edema present.     Comments: Right leg rotated and very painful with any movement.   Skin:    General: Skin is warm and dry.  Neurological:     General: No focal deficit present.     Mental Status: She is disoriented.     Cranial Nerves: No cranial nerve deficit.  Psychiatric:     Comments: In pain - moaning.       Labs on Admission: I have personally reviewed following labs and imaging studies  CBC: Recent Labs  Lab 10/03/22 1712 10/09/22 0650  WBC 6.0 6.0  NEUTROABS 3.9  --   HGB 11.5* 12.0  HCT 35.1* 36.5  MCV 100.6* 98.4  PLT 143* 151   Basic Metabolic Panel: Recent Labs  Lab 10/03/22 1712 10/09/22 0650  NA 133* 130*  K 3.5 3.3*  CL 97* 93*  CO2 22 20*  GLUCOSE 105* 124*  BUN 31* 33*  CREATININE 1.35* 1.51*  CALCIUM 9.4 9.4   GFR: Estimated Creatinine Clearance: 20.3 mL/min (A) (by C-G formula based on SCr of 1.51 mg/dL  (H)). Liver Function Tests: Recent Labs  Lab 10/03/22 1712  AST 32  ALT 17  ALKPHOS 64  BILITOT 1.4*  PROT 6.9  ALBUMIN 3.9   No results for input(s): "LIPASE", "AMYLASE" in the last 168 hours. No results for input(s): "AMMONIA" in the last 168 hours. Coagulation Profile: No results for input(s): "INR", "PROTIME" in the last 168 hours. Cardiac Enzymes: Recent Labs  Lab 10/09/22 0650  CKTOTAL 83   BNP (last 3 results) No results for input(s): "PROBNP" in the last 8760 hours. HbA1C: No results for input(s): "HGBA1C" in the last 72 hours. CBG: No results for input(s): "GLUCAP" in the last 168 hours. Lipid Profile: No results for input(s): "CHOL", "HDL", "LDLCALC", "TRIG", "CHOLHDL", "LDLDIRECT" in the last 72 hours. Thyroid Function Tests: No results for input(s): "TSH", "T4TOTAL", "FREET4", "T3FREE", "THYROIDAB" in the last 72 hours. Anemia Panel: No results for input(s): "VITAMINB12", "FOLATE", "FERRITIN", "TIBC", "IRON", "RETICCTPCT" in the last 72 hours. Urine analysis:  Component Value Date/Time   COLORURINE YELLOW 10/03/2022 1847   APPEARANCEUR CLEAR 10/03/2022 1847   LABSPEC 1.010 10/03/2022 1847   PHURINE 6.0 10/03/2022 1847   GLUCOSEU NEGATIVE 10/03/2022 1847   HGBUR NEGATIVE 10/03/2022 1847   HGBUR 2+ 12/13/2008 1032   BILIRUBINUR NEGATIVE 10/03/2022 1847   BILIRUBINUR n 12/25/2010 1311   KETONESUR NEGATIVE 10/03/2022 1847   PROTEINUR 30 (A) 10/03/2022 1847   UROBILINOGEN 0.2 12/25/2010 1311   UROBILINOGEN 0.2 12/13/2008 1032   NITRITE NEGATIVE 10/03/2022 1847   LEUKOCYTESUR TRACE (A) 10/03/2022 1847    Radiological Exams on Admission: I have personally reviewed images CT Head Wo Contrast  Result Date: 10/09/2022 CLINICAL DATA:  89 year old female history of trauma from a fall. Head and neck pain. EXAM: CT HEAD WITHOUT CONTRAST CT CERVICAL SPINE WITHOUT CONTRAST TECHNIQUE: Multidetector CT imaging of the head and cervical spine was performed following  the standard protocol without intravenous contrast. Multiplanar CT image reconstructions of the cervical spine were also generated. RADIATION DOSE REDUCTION: This exam was performed according to the departmental dose-optimization program which includes automated exposure control, adjustment of the mA and/or kV according to patient size and/or use of iterative reconstruction technique. COMPARISON:  Head CT 10/03/2022.  No prior cervical spine CT. FINDINGS: CT HEAD FINDINGS Brain: Mild cerebral atrophy. Patchy and confluent areas of decreased attenuation are noted throughout the deep and periventricular white matter of the cerebral hemispheres bilaterally, compatible with chronic microvascular ischemic disease. No evidence of acute infarction, hemorrhage, hydrocephalus, extra-axial collection or mass lesion/mass effect. Vascular: No hyperdense vessel or unexpected calcification. Skull: Normal. Negative for fracture or focal lesion. Sinuses/Orbits: No acute finding. Other: Small left mastoid effusion, unchanged. CT CERVICAL SPINE FINDINGS Comment: Study is limited by considerable patient motion. Alignment: Normal. Skull base and vertebrae: No acute fracture. No primary bone lesion or focal pathologic process. Soft tissues and spinal canal: No prevertebral fluid or swelling. No visible canal hematoma. Disc levels: Multilevel degenerative disc disease, most pronounced at C5-C6. Moderate multilevel facet arthropathy bilaterally. Upper chest: Negative. Other: None. IMPRESSION: 1. No evidence of significant acute traumatic injury to the skull, brain or cervical spine. 2. Mild cerebral atrophy with chronic microvascular ischemic changes in the cerebral white matter, as above. 3. Multilevel degenerative disc disease and cervical spondylosis, as above. Electronically Signed   By: Trudie Reed M.D.   On: 10/09/2022 08:08   CT Cervical Spine Wo Contrast  Result Date: 10/09/2022 CLINICAL DATA:  89 year old female history  of trauma from a fall. Head and neck pain. EXAM: CT HEAD WITHOUT CONTRAST CT CERVICAL SPINE WITHOUT CONTRAST TECHNIQUE: Multidetector CT imaging of the head and cervical spine was performed following the standard protocol without intravenous contrast. Multiplanar CT image reconstructions of the cervical spine were also generated. RADIATION DOSE REDUCTION: This exam was performed according to the departmental dose-optimization program which includes automated exposure control, adjustment of the mA and/or kV according to patient size and/or use of iterative reconstruction technique. COMPARISON:  Head CT 10/03/2022.  No prior cervical spine CT. FINDINGS: CT HEAD FINDINGS Brain: Mild cerebral atrophy. Patchy and confluent areas of decreased attenuation are noted throughout the deep and periventricular white matter of the cerebral hemispheres bilaterally, compatible with chronic microvascular ischemic disease. No evidence of acute infarction, hemorrhage, hydrocephalus, extra-axial collection or mass lesion/mass effect. Vascular: No hyperdense vessel or unexpected calcification. Skull: Normal. Negative for fracture or focal lesion. Sinuses/Orbits: No acute finding. Other: Small left mastoid effusion, unchanged. CT CERVICAL SPINE FINDINGS Comment: Study is  limited by considerable patient motion. Alignment: Normal. Skull base and vertebrae: No acute fracture. No primary bone lesion or focal pathologic process. Soft tissues and spinal canal: No prevertebral fluid or swelling. No visible canal hematoma. Disc levels: Multilevel degenerative disc disease, most pronounced at C5-C6. Moderate multilevel facet arthropathy bilaterally. Upper chest: Negative. Other: None. IMPRESSION: 1. No evidence of significant acute traumatic injury to the skull, brain or cervical spine. 2. Mild cerebral atrophy with chronic microvascular ischemic changes in the cerebral white matter, as above. 3. Multilevel degenerative disc disease and cervical  spondylosis, as above. Electronically Signed   By: Trudie Reed M.D.   On: 10/09/2022 08:08   CT Lumbar Spine Wo Contrast  Result Date: 10/09/2022 CLINICAL DATA:  Back trauma, no prior imaging (Age >= 16y) EXAM: CT LUMBAR SPINE WITHOUT CONTRAST TECHNIQUE: Multidetector CT imaging of the lumbar spine was performed without intravenous contrast administration. Multiplanar CT image reconstructions were also generated. RADIATION DOSE REDUCTION: This exam was performed according to the departmental dose-optimization program which includes automated exposure control, adjustment of the mA and/or kV according to patient size and/or use of iterative reconstruction technique. COMPARISON:  BCT AP 10/03/22 FINDINGS: Segmentation: 5 lumbar type vertebrae. Alignment: Trace retrolisthesis of T12-L1. Grade 1 anterolisthesis of L4 on L5. Grade 1-2 anterolisthesis of on S1. Vertebrae: Vertebral body heights are maintained. No focal pathologic process. Paraspinal and other soft tissues: Bibasilar atelectasis. Aortic atherosclerotic calcifications. Status post cholecystectomy. No evidence of hydronephrosis or nephrolithiasis. Compared to prior exam there is increased fluid in the right lower quadrant, which is nonspecific and may be reactive. Disc levels: There is no evidence of high-grade spinal canal stenosis. Moderate bilateral foraminal narrowing at L5-S1. There is degenerative fusion of the L4-L5 and L5-S1 facet joints. IMPRESSION: 1. No acute fracture or traumatic listhesis. 2. Compared to prior exam there is increased fluid in the right lower quadrant, which is nonspecific and may be reactive Aortic Atherosclerosis (ICD10-I70.0). Electronically Signed   By: Lorenza Cambridge M.D.   On: 10/09/2022 08:08   DG Pelvis 1-2 Views  Result Date: 10/09/2022 CLINICAL DATA:  Trauma, fall EXAM: PELVIS - 1-2 VIEW COMPARISON:  05/04/2003 FINDINGS: There is a displaced fracture in the neck of right femur. There is superior displacement of  distal major fracture fragment. There is no dislocation in the right hip. Arterial calcifications are seen in soft tissues. IMPRESSION: Displaced fracture is seen in the neck of right femur. Electronically Signed   By: Ernie Avena M.D.   On: 10/09/2022 07:47   DG FEMUR, MIN 2 VIEWS RIGHT  Result Date: 10/09/2022 CLINICAL DATA:  Trauma, fall EXAM: RIGHT FEMUR 2 VIEWS COMPARISON:  None FINDINGS: There is fracture in the subcapital portion of neck of right femur. There is superior displacement of distal fracture fragment. There is no dislocation in the hip. Degenerative changes are noted in the right knee with bony spurs. Arterial calcifications are seen in the soft tissues. IMPRESSION: Displaced fracture is seen in the neck of right femur. Degenerative changes are noted in the right knee.  Arteriosclerosis. Electronically Signed   By: Ernie Avena M.D.   On: 10/09/2022 07:46    EKG: I have personally reviewed EKG: junctional rhythm (patient with pacemaker) rate controlled, LVH, IVCD. No acute changes, no STEMI  Assessment/Plan Principal Problem:   Fracture of femoral neck, right (HCC) Active Problems:   Intertrochanteric fx-closed, right, initial encounter (HCC)   Chronic combined systolic and diastolic CHF (congestive heart failure) (HCC)   CKD  stage 3a, GFR 45-59 ml/min (HCC)   Hypothyroidism   Essential hypertension   GERD   Diabetes (HCC)   Complete heart block (HCC)   Paroxysmal atrial fibrillation (HCC)    Assessment and Plan: Intertrochanteric fx-closed, right, initial encounter [facility]) Patient with unwitnessed  fall at home with subsequent pain and inability to bear weight. X-ray reveals displaced fx right fermoral neck. Patient seen by Dr. Lin Givens for orthopedics - plan is for surgical repair.  Plan Med-surg admit (cardiac problems stable - no indication for telemetry)  Cardiology to see for surgical clearance  Pain mgt - minimizing narcotics  Post-op PT/OT - patient  lives with daughter but may need rehab post-fracture repair  CKD stage 3a, GFR 45-59 ml/min (HCC) Cr at baseline. Patient with poor PO intake over last 12-24 hrs.  Plan Avoid nephrotoxic meds  Low flow IVF  Chronic combined systolic and diastolic CHF (congestive heart failure) (HCC) Patient appears stable w/o signs of decompensation.  Plan Continue home regimen  Paroxysmal atrial fibrillation (HCC) Stable rate. Not on anticoagulation at this time. Was on warfarin. Was tried on Eliquis which she did not tolerate.  Plan Continue home regimen  Cardilogy to see for surgical clearance  Routine DVT prophylaxis  Complete heart block [facility]) Patient with pacemaker. Per EDP - pacemaker interrogated - no recent acute arrythmias. EKG reveals junctional rhythm.  Plan Cardiology to see for clearance for surgery  No indication for telemetry admission.   Diabetes (HCC) Patient diet controlled with last A1C 05/08/22 6.7%  Plan Sliding scale coverage  Low carb diet  GERD Per family - no active problem  Plan PPI daily  Essential hypertension BP well controlled at time of admission.  Plan Continue home medications  Hypothyroidism Last TSH 09/03/22 5.0  Plan Continue home dose of levothyroxine       DVT prophylaxis: SQ Heparin Code Status: DNR/DNI(Do NOT Intubate) Family Communication: daughter present - reported patient wishes in regard to CPR/intubation = DNR/DNI. Understands Dx and tx plan.   Disposition Plan: TBD  Consults called: Ortho - Dr. Lin Givens; Cardiology - [facility]  Admission status: Inpatient, Med-Surg   Illene Regulus, MD [facility] 10/09/2022, 11:12 AM

## 2022-10-09 NOTE — ED Notes (Signed)
Pt's O2 sat noted to be staying around 87-89% RA.  Pt placed on 3L [place].

## 2022-10-09 NOTE — ED Notes (Signed)
Ortho at bedside.

## 2022-10-09 NOTE — Anesthesia Preprocedure Evaluation (Signed)
Anesthesia Evaluation  Patient identified by MRN, date of birth, ID band Patient awake and Patient confused    Reviewed: Allergy & Precautions, Patient's Chart, lab work & pertinent test results, reviewed documented beta blocker date and time   History of Anesthesia Complications Negative for: history of anesthetic complications  Airway        Dental   Pulmonary asthma           Cardiovascular hypertension, Pt. on medications and Pt. on home beta blockers + CAD and +CHF  + dysrhythmias   TTE 09/04/22:  1. Global hypokinesis abnoraml septal motion worse at inferior base. Left  ventricular ejection fraction, by estimation, is 40 to 45%. The left  ventricle has mildly decreased function. The left ventricle demonstrates  global hypokinesis. The left  ventricular internal cavity size was mildly dilated. Left ventricular  diastolic parameters are indeterminate.   2. Device lead in RA/RV . Right ventricular systolic function is  moderately reduced. The right ventricular size is normal.   3. Left atrial size was moderately dilated.   4. Right atrial size was moderately dilated.   5. The mitral valve is normal in structure. Moderate to severe mitral  valve regurgitation. No evidence of mitral stenosis.   6. Cannot r/o device lead impingement on TV leaflets as cause for severe  TR No 3D short axis images done . Tricuspid valve regurgitation is severe.   7. The aortic valve is tricuspid. There is mild calcification of the  aortic valve. There is mild thickening of the aortic valve. Aortic valve  regurgitation is not visualized. Aortic valve sclerosis is present, with  no evidence of aortic valve stenosis.   8. The inferior vena cava is dilated in size with <50% respiratory  variability, suggesting right atrial pressure of 15 mmHg.     Neuro/Psych    GI/Hepatic hiatal hernia,GERD  Medicated,,  Endo/Other  diabetesHypothyroidism     Renal/GU Renal InsufficiencyRenal disease     Musculoskeletal  (+) Arthritis ,  Right femur fracture   Abdominal   Peds  Hematology   Anesthesia Other Findings   Reproductive/Obstetrics                              Anesthesia Physical Anesthesia Plan  ASA: 2  Anesthesia Plan: Regional   Post-op Pain Management:    Induction:   PONV Risk Score and Plan:   Airway Management Planned:   Additional Equipment:   Intra-op Plan:   Post-operative Plan:   Informed Consent: I have reviewed the patients History and Physical, chart, labs and discussed the procedure including the risks, benefits and alternatives for the proposed anesthesia with the patient or authorized representative who has indicated his/her understanding and acceptance.     Consent reviewed with POA  Plan Discussed with:   Anesthesia Plan Comments:          Anesthesia Quick Evaluation

## 2022-10-09 NOTE — Assessment & Plan Note (Signed)
Last TSH 09/03/22 5.0  Plan Continue home dose of levothyroxine

## 2022-10-09 NOTE — Consult Note (Signed)
Reason for Consult:Right hip fx Referring Physician: Rolly Pancake Time called: 0740 Time at bedside:0848    Beverly Kim is an 89 y.o. female.  HPI: Altheia fell at home. She c/o right hip pain and was brought to the ED. X-rays showed a right hip fx and orthopedic surgery was consulted. She is normally A&O but seems very confused here and cannot contribute to history. Just keeps repeating "I don't understand". She lives at home with her daughter and uses a rollator device to ambulate.  Past Medical History:  Diagnosis Date   Acute blood loss anemia 09/04/2022   Cardiac pacemaker 05/2016   Sick sinus syndrome/complete heart block   Chronic heart failure with preserved ejection fraction (HFpEF) (HCC)    Colon polyps    Diverticulosis    Dyspnea    GERD (gastroesophageal reflux disease)    Guaiac positive stools 09/04/2022   Hearing loss of both ears    Hepatic cyst    Hiatal hernia    Hyperlipidemia    Hypertension    Hypothyroidism    Low back pain    Non-occlusive coronary artery disease 04/2001   Cardiac Cath 04/18/2001: 50 and 70% mid LAD after D1. = Nonischemic by Myoview.  Medical management.   Other left bundle branch block    PAF (paroxysmal atrial fibrillation) (HCC) 08/16/2016   observed on PPM interrogation, asymptomatic, chad2vasc score is 5.  Bisoprolol for rate control, Xarelto for anticoagulation.   Rectal bleeding 09/04/2022   URI (upper respiratory infection)     Past Surgical History:  Procedure Laterality Date   BIOPSY  09/05/2022   Procedure: BIOPSY;  Surgeon: Meridee Score Netty Starring., MD;  Location: [facility] ENDOSCOPY;  Service: Gastroenterology;;   CARDIOVERSION N/A 01/16/2019   Procedure: CARDIOVERSION;  Surgeon: Wendall Stade, MD;  Location: [facility] ENDOSCOPY;  Service: Cardiovascular;  Laterality: N/A;   CHOLECYSTECTOMY N/A 12/15/2019   Procedure: LAPAROSCOPIC CHOLECYSTECTOMY;  Surgeon: Almond Lint, MD;  Location: [facility] OR;  Service: General;  Laterality:  N/A;   COLONOSCOPY     COLONOSCOPY N/A 09/05/2022   Procedure: COLONOSCOPY;  Surgeon: Lemar Lofty., MD;  Location: [facility] ENDOSCOPY;  Service: Gastroenterology;  Laterality: N/A;   ERCP N/A 07/21/2019   Procedure: ENDOSCOPIC RETROGRADE CHOLANGIOPANCREATOGRAPHY (ERCP);  Surgeon: Rachael Fee, MD;  Location: [facility] ENDOSCOPY;  Service: Endoscopy;  Laterality: N/A;   HERNIA REPAIR     HOT HEMOSTASIS N/A 09/05/2022   Procedure: HOT HEMOSTASIS (ARGON PLASMA COAGULATION/BICAP);  Surgeon: Lemar Lofty., MD;  Location: [facility] ENDOSCOPY;  Service: Gastroenterology;  Laterality: N/A;   LEFT HEART CATH AND CORONARY ANGIOGRAPHY  04/18/2001   Dr. Riley Kill: EF 60 to 65%.  2+ MR.  Upper LM takeoff with 90 degree bend just inside the ostium.  50-70% mid LAD after D1.  D1 is 30%.    Distal LAD wraps the apex and is free of disease.  LCx is mostly large OM branch.  RCA provides PDA and PL branches and Free of disease. -->  Presumably evaluated by Myoview that was nonischemic, therefore no PCI performed.   NM MYOVIEW LTD  01/2012   Normal EF.  Normal study.  No ischemia or infarction.   PACEMAKER IMPLANT Left 05/15/2016   SJM Assirity MRI dual chamber PPM implanted by Dr Johney Frame for CHB   POLYPECTOMY  09/05/2022   Procedure: POLYPECTOMY;  Surgeon: Mansouraty, Netty Starring., MD;  Location: [facility] ENDOSCOPY;  Service: Gastroenterology;;   REMOVAL OF STONES  07/21/2019   Procedure: REMOVAL OF STONES;  Surgeon: Rachael Fee, MD;  Location: [facility] ENDOSCOPY;  Service: Endoscopy;;   SPHINCTEROTOMY  07/21/2019   Procedure: Dennison Mascot;  Surgeon: Rachael Fee, MD;  Location: [facility] ENDOSCOPY;  Service: Endoscopy;;   THYROIDECTOMY     TONSILLECTOMY AND ADENOIDECTOMY     TOTAL ABDOMINAL HYSTERECTOMY W/ BILATERAL SALPINGOOPHORECTOMY     TRANSTHORACIC ECHOCARDIOGRAM  06/27/2016   Moderate basal-septal LVH, EF 60 to 65%.  No R WMA.  GR 1 DD.  Mild aortic valve calcification.  No AS.  Trivial MR.--Indicated improved EF  compared to prior study in 2015.   TRANSTHORACIC ECHOCARDIOGRAM  02/22/2012   12/'13 -a) EF 50 and 55%.  Normal function.-Moderate MR.  Mild LA dilation.; b) 11/2015Echo for shortness of breath => reduced EF of 45 to 50%.  Mild LVH.  Inferior reversible HK.  GRII DD.  Moderate MR.   TRANSTHORACIC ECHOCARDIOGRAM  10/2019   EF 60 -65%.  No RWMA.  Severe concentric LVH.  Unable to assess diastolic function.  Mild biatrial dilation.  Moderate MR moderate TR, no AS   TRANSTHORACIC ECHOCARDIOGRAM  06/15/2021   EF 40 to 45%.  Paradoxical septal motion from pacemaker.  Mild concentric LVH.  Indeterminate diastolic pressure.  Severe biatrial enlargement would argue significant diastolic dysfunction.  Severe MR and severe TR. => EF down from 60 to 65%.    Family History  Problem Relation Age of Onset   Coronary artery disease Other        family hx of 1st degree relative <50   Diabetes Other        family hx of   Other Other        cardiovascular disorder family hx of   Other Other        neurological disorder family hx of   Other Other        respiratory disease family hx of   Heart attack Daughter    Heart Problems Other        all children   Sudden death Son    Heart disease Brother    Heart disease Sister    Colon cancer Neg Hx    Colon polyps Neg Hx     Social History:  reports that she has never smoked. She has never used smokeless tobacco. She reports that she does not drink alcohol and does not use drugs.  Allergies:  Allergies  Allergen Reactions   Adhesive [Tape] Itching, Dermatitis, Rash and Other (See Comments)    Blisters and "skin bubbles"   Ace Inhibitors Cough   Atorvastatin Other (See Comments)    REACTION: Reaction not known   Clindamycin Other (See Comments)    Unknown   Codeine Other (See Comments)    hallucinations    Latex Other (See Comments)    blisters   Shellfish Allergy Other (See Comments)    "gallbladder attack"   Simvastatin Other (See Comments)     fatigue   Penicillins Rash    Medications: I have reviewed the patient's current medications.  Results for orders placed or performed during the hospital encounter of 10/09/22 (from the past 48 hour(s))  CBC     Status: Abnormal   Collection Time: 10/09/22  6:50 AM  Result Value Ref Range   WBC 6.0 4.0 - 10.5 K/uL   RBC 3.71 (L) 3.87 - 5.11 MIL/uL   Hemoglobin 12.0 12.0 - 15.0 g/dL   HCT 40.9 81.1 - 91.4 %   MCV 98.4 80.0 - 100.0 fL  MCH 32.3 26.0 - 34.0 pg   MCHC 32.9 30.0 - 36.0 g/dL   RDW 16.1 09.6 - 04.5 %   Platelets 151 150 - 400 K/uL   nRBC 0.0 0.0 - 0.2 %    Comment: Performed at [facility], [address] [place], [place] [address]  Basic metabolic panel     Status: Abnormal   Collection Time: 10/09/22  6:50 AM  Result Value Ref Range   Sodium 130 (L) 135 - 145 mmol/L   Potassium 3.3 (L) 3.5 - 5.1 mmol/L   Chloride 93 (L) 98 - 111 mmol/L   CO2 20 (L) 22 - 32 mmol/L   Glucose, Bld 124 (H) 70 - 99 mg/dL    Comment: Glucose reference range applies only to samples taken after fasting for at least 8 hours.   BUN 33 (H) 8 - 23 mg/dL   Creatinine, Ser 1.91 (H) 0.44 - 1.00 mg/dL   Calcium 9.4 8.9 - 47.8 mg/dL   GFR, Estimated 31 (L) >60 mL/min    Comment: (NOTE) Calculated using the CKD-EPI Creatinine Equation (2021)    Anion gap 17 (H) 5 - 15    Comment: Performed at [facility], [address] [place], [place] [address]  CK     Status: None   Collection Time: 10/09/22  6:50 AM  Result Value Ref Range   Total CK 83 38 - 234 U/L    Comment: Performed at [facility], [address], [place], [place] [address]  Troponin I (High Sensitivity)     Status: Abnormal   Collection Time: 10/09/22  6:50 AM  Result Value Ref Range   Troponin I (High Sensitivity) 29 (H) <18 ng/L    Comment: (NOTE) Elevated high sensitivity troponin I (hsTnI) values and significant  changes across serial measurements may suggest ACS but many other  chronic and acute  conditions are known to elevate hsTnI results.  Refer to the "Links" section for chest pain algorithms and additional  guidance. Performed at [facility], [address], [place], [place] [address]     CT Head Wo Contrast  Result Date: 10/09/2022 CLINICAL DATA:  89 year old female history of trauma from a fall. Head and neck pain. EXAM: CT HEAD WITHOUT CONTRAST CT CERVICAL SPINE WITHOUT CONTRAST TECHNIQUE: Multidetector CT imaging of the head and cervical spine was performed following the standard protocol without intravenous contrast. Multiplanar CT image reconstructions of the cervical spine were also generated. RADIATION DOSE REDUCTION: This exam was performed according to the departmental dose-optimization program which includes automated exposure control, adjustment of the mA and/or kV according to patient size and/or use of iterative reconstruction technique. COMPARISON:  Head CT 10/03/2022.  No prior cervical spine CT. FINDINGS: CT HEAD FINDINGS Brain: Mild cerebral atrophy. Patchy and confluent areas of decreased attenuation are noted throughout the deep and periventricular white matter of the cerebral hemispheres bilaterally, compatible with chronic microvascular ischemic disease. No evidence of acute infarction, hemorrhage, hydrocephalus, extra-axial collection or mass lesion/mass effect. Vascular: No hyperdense vessel or unexpected calcification. Skull: Normal. Negative for fracture or focal lesion. Sinuses/Orbits: No acute finding. Other: Small left mastoid effusion, unchanged. CT CERVICAL SPINE FINDINGS Comment: Study is limited by considerable patient motion. Alignment: Normal. Skull base and vertebrae: No acute fracture. No primary bone lesion or focal pathologic process. Soft tissues and spinal canal: No prevertebral fluid or swelling. No visible canal hematoma. Disc levels: Multilevel degenerative disc disease, most pronounced at C5-C6.  Moderate multilevel facet arthropathy  bilaterally. Upper chest: Negative. Other: None. IMPRESSION: 1. No evidence of significant acute traumatic injury to the skull, brain or cervical spine. 2. Mild cerebral atrophy with chronic microvascular ischemic changes in the cerebral white matter, as above. 3. Multilevel degenerative disc disease and cervical spondylosis, as above. Electronically Signed   By: Trudie Reed M.D.   On: 10/09/2022 08:08   CT Cervical Spine Wo Contrast  Result Date: 10/09/2022 CLINICAL DATA:  89 year old female history of trauma from a fall. Head and neck pain. EXAM: CT HEAD WITHOUT CONTRAST CT CERVICAL SPINE WITHOUT CONTRAST TECHNIQUE: Multidetector CT imaging of the head and cervical spine was performed following the standard protocol without intravenous contrast. Multiplanar CT image reconstructions of the cervical spine were also generated. RADIATION DOSE REDUCTION: This exam was performed according to the departmental dose-optimization program which includes automated exposure control, adjustment of the mA and/or kV according to patient size and/or use of iterative reconstruction technique. COMPARISON:  Head CT 10/03/2022.  No prior cervical spine CT. FINDINGS: CT HEAD FINDINGS Brain: Mild cerebral atrophy. Patchy and confluent areas of decreased attenuation are noted throughout the deep and periventricular white matter of the cerebral hemispheres bilaterally, compatible with chronic microvascular ischemic disease. No evidence of acute infarction, hemorrhage, hydrocephalus, extra-axial collection or mass lesion/mass effect. Vascular: No hyperdense vessel or unexpected calcification. Skull: Normal. Negative for fracture or focal lesion. Sinuses/Orbits: No acute finding. Other: Small left mastoid effusion, unchanged. CT CERVICAL SPINE FINDINGS Comment: Study is limited by considerable patient motion. Alignment: Normal. Skull base and vertebrae: No acute fracture. No primary bone lesion or focal pathologic process. Soft  tissues and spinal canal: No prevertebral fluid or swelling. No visible canal hematoma. Disc levels: Multilevel degenerative disc disease, most pronounced at C5-C6. Moderate multilevel facet arthropathy bilaterally. Upper chest: Negative. Other: None. IMPRESSION: 1. No evidence of significant acute traumatic injury to the skull, brain or cervical spine. 2. Mild cerebral atrophy with chronic microvascular ischemic changes in the cerebral white matter, as above. 3. Multilevel degenerative disc disease and cervical spondylosis, as above. Electronically Signed   By: Trudie Reed M.D.   On: 10/09/2022 08:08   CT Lumbar Spine Wo Contrast  Result Date: 10/09/2022 CLINICAL DATA:  Back trauma, no prior imaging (Age >= 16y) EXAM: CT LUMBAR SPINE WITHOUT CONTRAST TECHNIQUE: Multidetector CT imaging of the lumbar spine was performed without intravenous contrast administration. Multiplanar CT image reconstructions were also generated. RADIATION DOSE REDUCTION: This exam was performed according to the departmental dose-optimization program which includes automated exposure control, adjustment of the mA and/or kV according to patient size and/or use of iterative reconstruction technique. COMPARISON:  BCT AP 10/03/22 FINDINGS: Segmentation: 5 lumbar type vertebrae. Alignment: Trace retrolisthesis of T12-L1. Grade 1 anterolisthesis of L4 on L5. Grade 1-2 anterolisthesis of on S1. Vertebrae: Vertebral body heights are maintained. No focal pathologic process. Paraspinal and other soft tissues: Bibasilar atelectasis. Aortic atherosclerotic calcifications. Status post cholecystectomy. No evidence of hydronephrosis or nephrolithiasis. Compared to prior exam there is increased fluid in the right lower quadrant, which is nonspecific and may be reactive. Disc levels: There is no evidence of high-grade spinal canal stenosis. Moderate bilateral foraminal narrowing at L5-S1. There is degenerative fusion of the L4-L5 and L5-S1 facet  joints. IMPRESSION: 1. No acute fracture or traumatic listhesis. 2. Compared to prior exam there is increased fluid in the right lower quadrant, which is nonspecific and may be reactive Aortic Atherosclerosis (ICD10-I70.0). Electronically Signed   By: Sandria Senter  Celine Mans M.D.   On: 10/09/2022 08:08   DG Pelvis 1-2 Views  Result Date: 10/09/2022 CLINICAL DATA:  Trauma, fall EXAM: PELVIS - 1-2 VIEW COMPARISON:  05/04/2003 FINDINGS: There is a displaced fracture in the neck of right femur. There is superior displacement of distal major fracture fragment. There is no dislocation in the right hip. Arterial calcifications are seen in soft tissues. IMPRESSION: Displaced fracture is seen in the neck of right femur. Electronically Signed   By: Ernie Avena M.D.   On: 10/09/2022 07:47   DG FEMUR, MIN 2 VIEWS RIGHT  Result Date: 10/09/2022 CLINICAL DATA:  Trauma, fall EXAM: RIGHT FEMUR 2 VIEWS COMPARISON:  None FINDINGS: There is fracture in the subcapital portion of neck of right femur. There is superior displacement of distal fracture fragment. There is no dislocation in the hip. Degenerative changes are noted in the right knee with bony spurs. Arterial calcifications are seen in the soft tissues. IMPRESSION: Displaced fracture is seen in the neck of right femur. Degenerative changes are noted in the right knee.  Arteriosclerosis. Electronically Signed   By: Ernie Avena M.D.   On: 10/09/2022 07:46    Review of Systems  Unable to perform ROS: Mental status change   Blood pressure (!) 134/59, pulse 62, temperature 97.8 F (36.6 C), temperature source Oral, resp. rate 18, SpO2 95%. Physical Exam Constitutional:      General: She is not in acute distress.    Appearance: She is well-developed. She is not diaphoretic.  HENT:     Head: Normocephalic and atraumatic.  Eyes:     General: No scleral icterus.       Right eye: No discharge.        Left eye: No discharge.     Conjunctiva/sclera:  Conjunctivae normal.  Cardiovascular:     Rate and Rhythm: Normal rate and regular rhythm.  Pulmonary:     Effort: Pulmonary effort is normal. No respiratory distress.  Musculoskeletal:     Cervical back: Normal range of motion.     Comments: RLE No traumatic wounds, ecchymosis, or rash  Mod TTP hip  No knee or ankle effusion  Knee stable to varus/ valgus and anterior/posterior stress  Sens DPN, SPN, TN could not assess  Motor EHL, ext, flex, evers could not assess  DP 1+, PT 0, 3+ pitting edema  Skin:    General: Skin is warm and dry.  Neurological:     Mental Status: She is alert.  Psychiatric:        Mood and Affect: Mood normal.        Behavior: Behavior normal.     Assessment/Plan: Right hip fx -- Will need hip hemi once cleared by medicine and cardiology, suspect Thursday as long as clearance obtained by then. Multiple medical problems including combined systolic/diastolic heart failure with an EF of 40 to 45%, history of permanent pacemaker, hypertension, CKD stage III yea baseline creatinine approximately 1.3-1.5, bilateral hearing loss, reflux, chronic dyspnea, chronic nausea, chronic cough, and reflux -- per primary service    Freeman Caldron, PA-C Orthopedic Surgery [phone] 10/09/2022, 9:08 AM

## 2022-10-09 NOTE — Assessment & Plan Note (Signed)
Patient with pacemaker. Per EDP - pacemaker interrogated - no recent acute arrythmias. EKG reveals junctional rhythm.  Plan Cardiology to see for clearance for surgery  No indication for telemetry admission.

## 2022-10-09 NOTE — ED Notes (Signed)
Abbott Rep at bedside

## 2022-10-09 NOTE — Plan of Care (Signed)

## 2022-10-09 NOTE — Assessment & Plan Note (Signed)
Patient diet controlled with last A1C 05/08/22 6.7%  Plan Sliding scale coverage  Low carb diet

## 2022-10-09 NOTE — ED Notes (Signed)
Patient transported to Radiology 

## 2022-10-09 NOTE — Assessment & Plan Note (Signed)
Patient with unwitnessed  fall at home with subsequent pain and inability to bear weight. X-ray reveals displaced fx right fermoral neck. Patient seen by Dr. Lin Givens for orthopedics - plan is for surgical repair.  Plan Med-surg admit (cardiac problems stable - no indication for telemetry)  Cardiology to see for surgical clearance  Pain mgt - minimizing narcotics  Post-op PT/OT - patient lives with daughter but may need rehab post-fracture repair

## 2022-10-10 DIAGNOSIS — E1122 Type 2 diabetes mellitus with diabetic chronic kidney disease: Secondary | ICD-10-CM

## 2022-10-10 DIAGNOSIS — I442 Atrioventricular block, complete: Secondary | ICD-10-CM

## 2022-10-10 DIAGNOSIS — N183 Chronic kidney disease, stage 3 unspecified: Secondary | ICD-10-CM

## 2022-10-10 DIAGNOSIS — S72001A Fracture of unspecified part of neck of right femur, initial encounter for closed fracture: Secondary | ICD-10-CM | POA: Diagnosis not present

## 2022-10-10 DIAGNOSIS — I480 Paroxysmal atrial fibrillation: Secondary | ICD-10-CM

## 2022-10-10 DIAGNOSIS — S72141A Displaced intertrochanteric fracture of right femur, initial encounter for closed fracture: Secondary | ICD-10-CM | POA: Diagnosis not present

## 2022-10-10 DIAGNOSIS — E038 Other specified hypothyroidism: Secondary | ICD-10-CM | POA: Diagnosis not present

## 2022-10-10 DIAGNOSIS — I10 Essential (primary) hypertension: Secondary | ICD-10-CM

## 2022-10-10 DIAGNOSIS — N1831 Chronic kidney disease, stage 3a: Secondary | ICD-10-CM

## 2022-10-10 DIAGNOSIS — I5042 Chronic combined systolic (congestive) and diastolic (congestive) heart failure: Secondary | ICD-10-CM

## 2022-10-10 DIAGNOSIS — K219 Gastro-esophageal reflux disease without esophagitis: Secondary | ICD-10-CM

## 2022-10-10 LAB — GLUCOSE, CAPILLARY
Glucose-Capillary: 96 mg/dL (ref 70–99)
Glucose-Capillary: 105 mg/dL — ABNORMAL HIGH (ref 70–99)
Glucose-Capillary: 138 mg/dL — ABNORMAL HIGH (ref 70–99)
Glucose-Capillary: 147 mg/dL — ABNORMAL HIGH (ref 70–99)

## 2022-10-10 MED ORDER — OXYCODONE HCL 5 MG PO TABS: 10.0000 mg | ORAL_TABLET | ORAL | Status: DC | PRN

## 2022-10-10 MED ORDER — NYSTATIN 100000 UNIT/GM EX POWD
Freq: Three times a day (TID) | CUTANEOUS | Status: DC
  Administered 2022-10-13 (×2): 1 via TOPICAL
  Filled 2022-10-10: qty 15

## 2022-10-10 MED ORDER — ADULT MULTIVITAMIN W/MINERALS CH
1.0000 | ORAL_TABLET | Freq: Every day | ORAL | Status: DC
  Administered 2022-10-10 – 2022-10-16 (×7): 1 via ORAL
  Filled 2022-10-10 (×7): qty 1

## 2022-10-10 MED ORDER — HYDROMORPHONE HCL 1 MG/ML IJ SOLN
0.5000 mg | INTRAMUSCULAR | Status: DC | PRN
  Administered 2022-10-11: 0.5 mg via INTRAVENOUS
  Filled 2022-10-10: qty 0.5

## 2022-10-10 MED ORDER — PANTOPRAZOLE SODIUM 40 MG PO TBEC
40.0000 mg | DELAYED_RELEASE_TABLET | Freq: Every day | ORAL | Status: DC
  Administered 2022-10-10 – 2022-10-16 (×7): 40 mg via ORAL
  Filled 2022-10-10 (×6): qty 1

## 2022-10-10 MED ORDER — ENSURE ENLIVE PO LIQD
237.0000 mL | Freq: Two times a day (BID) | ORAL | Status: DC
  Administered 2022-10-10 – 2022-10-16 (×11): 237 mL via ORAL

## 2022-10-10 NOTE — TOC CAGE-AID Note (Signed)
Transition of Care [name]) - CAGE-AID Screening   Patient Details  Name: Beverly Kim MRN: [id number] Date of Birth: 1924/09/09  Transition of [facility] CM/SW Contact:    Erin Sons, LCSW Phone Number: 10/10/2022, 9:32 AM    CAGE-AID Screening: Substance Abuse Screening unable to be completed due to: : Patient unable to participate

## 2022-10-10 NOTE — Plan of Care (Signed)

## 2022-10-10 NOTE — Progress Notes (Signed)
Initial Nutrition Assessment  DOCUMENTATION CODES:   Not applicable  INTERVENTION:  Liberalize diet to regular to promote adequate PO intake Ensure Enlive po BID, each supplement provides 350 kcal and 20 grams of protein. MVI with minerals daily Request updated measured weight  NUTRITION DIAGNOSIS:   Increased nutrient needs related to post-op healing, hip fracture as evidenced by estimated needs.  GOAL:   Patient will meet greater than or equal to 90% of their needs  MONITOR:   PO intake, Supplement acceptance, Labs, Weight trends, Diet advancement  REASON FOR ASSESSMENT:   Consult Hip fracture protocol  ASSESSMENT:   Pt admitted after a fall at home leading to R femoral neck fracture. PMH significant for AF, non-occlusive CAD, CHB, CKD, CHF and Saint Jude pacemaker, bilateral hearing loss.   Ortho plans for fixation of fracture likely on Thursday.   Pt is hard of hearing. Utilized written communication and pt's daughter [name], who is present at bedside to obtain history and communicate. She reports that pt has chronically had intermittent nausea. She has a constant cough which she correlates to reflux. Pt had previously been on protonix but was stopped to see if this would help improve her nausea. She reports that nausea improved for one day but is now significantly worse and is inquiring about resuming this medication. Pt's daughter also mentions that she c/o a knot in her throat and increased secretions today requiring crushed medications with applesauce. Reached out to MD to updated on these concerns.   Overall denies difficulty chewing or swallowing. Her baseline PO intake includes 3 meals per day and tries to follow a low sodium, low fat diet, however over the last week d/t increase in nausea her portions have decreased. She typically drinks 1 Ensure daily but states that it has been more of a meal replacement for dinner over the last week.   Meal completions: 8/7: 10%  breakfast  Pt has been consistent in weighing self daily. Her weight averages around 150 lbs. Over the last 9 months, pt's weight noted to have fluctuated between 68-72 kg. Last documented weight was 72 kg on 7/31. Will request updated measured weight this admission.   Medications: lasix 40mg  daily, SSI 0-15 units TID, miralax, klor-con, senna  Labs: sodium 130, potassium 3.3, BUN 33, Cr 1.51, anion gap 17, GFR 31, CBG's 96-139 x24 hours  NUTRITION - FOCUSED PHYSICAL EXAM:  Flowsheet Row Most Recent Value  Orbital Region No depletion  Upper Arm Region No depletion  Thoracic and Lumbar Region No depletion  Buccal Region No depletion  Temple Region Moderate depletion  Clavicle Bone Region Mild depletion  Clavicle and Acromion Bone Region No depletion  Scapular Bone Region No depletion  Dorsal Hand Mild depletion  Patellar Region Mild depletion  Anterior Thigh Region Mild depletion  Posterior Calf Region Mild depletion  Edema (RD Assessment) Moderate  [BLE]  Hair Reviewed  Eyes Reviewed  Mouth Unable to assess  Skin Reviewed  Nails Reviewed       Diet Order:   Diet Order             Diet NPO time specified Except for: Sips with Meds  Diet effective midnight           Diet regular Room service appropriate? No; Fluid consistency: Thin  Diet effective now                   EDUCATION NEEDS:   Education needs have been addressed  Skin:  Skin Assessment: Reviewed RN Assessment  Last BM:  PTA  Height:   Ht Readings from Last 1 Encounters:  10/03/22 5\' 2"  (1.575 m)    Weight:   Wt Readings from Last 1 Encounters:  10/03/22 72 kg   BMI:  There is no height or weight on file to calculate BMI.  Estimated Nutritional Needs:   Kcal:  1500-1700  Protein:  75-90g  Fluid:  >/=1.5L  Drusilla Kanner, RDN, LDN Clinical Nutrition

## 2022-10-10 NOTE — Progress Notes (Signed)
PROGRESS NOTE    ZORAH BUSSINGER  WUJ:[id number] DOB: 03-Apr-1924 DOA: 10/09/2022 PCP: Pincus Sanes, MD   Brief Narrative:  Ms. Celestine a 89 yo F with hx of AF not on AC (recently stopped Eliquis), non-occlusive CAD, CHB, CKD, CHF and Saint Jude pacemaker who had an unwitnessed fall at home with right hip pain unable to bear weight.  Imaging confirms right femoral neck fracture.  Hospitalist called for admission, orthopedics called in consult.  Assessment & Plan:   Principal Problem:   Fracture of femoral neck, right (HCC) Active Problems:   Intertrochanteric fx-closed, right, initial encounter (HCC)   Chronic combined systolic and diastolic CHF (congestive heart failure) (HCC)   CKD stage 3a, GFR 45-59 ml/min (HCC)   Hypothyroidism   Essential hypertension   GERD   Diabetes (HCC)   Complete heart block (HCC)   Paroxysmal atrial fibrillation (HCC)  Intertrochanteric fx-closed, right, initial encounter (HCC) -Fracture confirmed on imaging, Dr. Lin Givens for orthopedics - plan is for surgical repair 10/11/2022 -Cardiology following for preop clearance given age and comorbid conditions, otherwise pain well-controlled, PT OT ongoing.     CKD stage 3a, GFR 45-59 ml/min (HCC) -At baseline, follow p.o. intake, urine output   Chronic combined systolic and diastolic CHF (congestive heart failure) (HCC) Patient appears stable w/o signs of decompensation.   Paroxysmal atrial fibrillation (HCC) Rate controlled. Not on anticoagulation at this time. Was on warfarin - transitioned to Eliquis but this too was discontinued due to "intolerance"  Complete heart block (HCC) Saint Jude pacemaker intact/functioning appropriately, cardiology following for preop clearance   Diabetes (HCC), diet controlled A1C 6.7   Continue sliding scale insulin, hypoglycemic protocol, low-carb diet  GERD Continue PPI   Intertrigo, POA -Nystatin topical powder  Essential hypertension BP well controlled at  time of admission on home medications.  Hypothyroidism Last TSH 09/03/22 5.0 -Continue levothyroxine  DVT prophylaxis: heparin injection 5,000 Units Start: 10/09/22 1400 Code Status:   Code Status: DNR Family Communication: None present  Status is: Inpatient  Dispo: The patient is from: Home              Anticipated d/c is to: To be determined, likely SNF              Anticipated d/c date is: 72+ hours              Patient currently not medically stable for discharge given need for surgical repair postoperative care, therapy and likely disposition to rehab facility  Consultants:  Orthopedic surgery  Procedures:  ORIF planned 10/11/2022  Antimicrobials:  Perioperatively  Subjective: No acute issues or events overnight, patient incredibly hard of hearing, review of systems somewhat limited due to patient's inability to communicate  Objective: Vitals:   10/09/22 1954 10/10/22 0007 10/10/22 0328 10/10/22 0741  BP: (!) 128/50 (!) 120/50 (!) 112/50 (!) 125/49  Pulse: 63 66 60 62  Resp: 18 18 17 17   Temp: 97.8 F (36.6 C) 98 F (36.7 C) 97.7 F (36.5 C) 98.7 F (37.1 C)  TempSrc: Oral  Oral   SpO2: 100% 100% 100% 100%    Intake/Output Summary (Last 24 hours) at 10/10/2022 1111 Last data filed at 10/10/2022 1100 Gross per 24 hour  Intake 121.47 ml  Output 700 ml  Net -578.53 ml   There were no vitals filed for this visit.  Examination:  General:  Pleasantly resting in bed, No acute distress. HEENT: Profoundly diminished hearing bilaterally Neck:  Without mass or deformity.  Trachea is midline. Lungs:  Clear to auscultate bilaterally without rhonchi, wheeze, or rales. Heart:  Regular rate and rhythm.  Without murmurs, rubs, or gallops. Abdomen:  Soft, nontender, nondistended.  Without guarding or rebound. Extremities: Without cyanosis, clubbing, edema, or obvious deformity. Skin:  Warm and dry, no erythema.   Data Reviewed: I have personally reviewed following labs and  imaging studies   Scheduled Meds:  acetaminophen  1,000 mg Oral TID   bisoprolol  2.5 mg Oral Daily   furosemide  40 mg Oral Daily   heparin  5,000 Units Subcutaneous Q8H   insulin aspart  0-15 Units Subcutaneous TID WC   levothyroxine  112 mcg Oral QAC breakfast   lidocaine  1 patch Transdermal Q24H   metolazone  2.5 mg Oral Once per day on Monday Wednesday Saturday   nystatin   Topical TID   polyethylene glycol  17 g Oral Daily   potassium chloride  10 mEq Oral Once per day on Monday Wednesday Saturday   senna  17.2 mg Oral Daily   traMADol  50 mg Oral Q6H   traZODone  50 mg Oral QHS     LOS: 1 day   Time spent:  [name], DO [facility]  If 7PM-7AM, please contact night-coverage [email or web address]  10/10/2022, 11:11 AM

## 2022-10-11 ENCOUNTER — Encounter (HOSPITAL_COMMUNITY): Payer: Self-pay | Admitting: Internal Medicine

## 2022-10-11 ENCOUNTER — Inpatient Hospital Stay (HOSPITAL_COMMUNITY): Payer: PPO | Admitting: Anesthesiology

## 2022-10-11 ENCOUNTER — Ambulatory Visit: Payer: PPO | Admitting: Nurse Practitioner

## 2022-10-11 ENCOUNTER — Other Ambulatory Visit: Payer: Self-pay

## 2022-10-11 ENCOUNTER — Inpatient Hospital Stay (HOSPITAL_COMMUNITY): Payer: PPO

## 2022-10-11 ENCOUNTER — Encounter (HOSPITAL_COMMUNITY)
Admission: EM | Disposition: A | Discharge status: Skilled Nursing Facility | Payer: Self-pay | Source: Home / Self Care | Attending: Internal Medicine

## 2022-10-11 DIAGNOSIS — E038 Other specified hypothyroidism: Secondary | ICD-10-CM | POA: Diagnosis not present

## 2022-10-11 DIAGNOSIS — S72001A Fracture of unspecified part of neck of right femur, initial encounter for closed fracture: Secondary | ICD-10-CM

## 2022-10-11 DIAGNOSIS — I130 Hypertensive heart and chronic kidney disease with heart failure and stage 1 through stage 4 chronic kidney disease, or unspecified chronic kidney disease: Secondary | ICD-10-CM | POA: Diagnosis not present

## 2022-10-11 DIAGNOSIS — E1122 Type 2 diabetes mellitus with diabetic chronic kidney disease: Secondary | ICD-10-CM

## 2022-10-11 DIAGNOSIS — S72141A Displaced intertrochanteric fracture of right femur, initial encounter for closed fracture: Secondary | ICD-10-CM | POA: Diagnosis not present

## 2022-10-11 DIAGNOSIS — I2510 Atherosclerotic heart disease of native coronary artery without angina pectoris: Secondary | ICD-10-CM | POA: Diagnosis not present

## 2022-10-11 DIAGNOSIS — N1831 Chronic kidney disease, stage 3a: Secondary | ICD-10-CM

## 2022-10-11 DIAGNOSIS — I442 Atrioventricular block, complete: Secondary | ICD-10-CM | POA: Diagnosis not present

## 2022-10-11 DIAGNOSIS — I5042 Chronic combined systolic (congestive) and diastolic (congestive) heart failure: Secondary | ICD-10-CM | POA: Diagnosis not present

## 2022-10-11 HISTORY — PX: HIP ARTHROPLASTY: SHX981

## 2022-10-11 LAB — TYPE AND SCREEN
ABO/RH(D): O POS
Antibody Screen: NEGATIVE

## 2022-10-11 LAB — GLUCOSE, CAPILLARY
Glucose-Capillary: 115 mg/dL — ABNORMAL HIGH (ref 70–99)
Glucose-Capillary: 108 mg/dL — ABNORMAL HIGH (ref 70–99)
Glucose-Capillary: 113 mg/dL — ABNORMAL HIGH (ref 70–99)
Glucose-Capillary: 159 mg/dL — ABNORMAL HIGH (ref 70–99)

## 2022-10-11 LAB — SURGICAL PCR SCREEN
MRSA, PCR: NEGATIVE
Staphylococcus aureus: POSITIVE — AB

## 2022-10-11 SURGERY — HEMIARTHROPLASTY, HIP, DIRECT ANTERIOR APPROACH, FOR FRACTURE
Anesthesia: General | Site: Hip | Laterality: Right

## 2022-10-11 MED ORDER — POVIDONE-IODINE 10 % EX SWAB
2.0000 | Freq: Once | CUTANEOUS | Status: AC
  Administered 2022-10-11: 2 via TOPICAL

## 2022-10-11 MED ORDER — OXYCODONE HCL 5 MG PO TABS: 5.0000 mg | ORAL_TABLET | Freq: Once | ORAL | Status: DC | PRN

## 2022-10-11 MED ORDER — BUPIVACAINE-MELOXICAM ER 200-6 MG/7ML IJ SOLN
INTRAMUSCULAR | Status: DC | PRN
  Administered 2022-10-11: 200 mg

## 2022-10-11 MED ORDER — ORAL CARE MOUTH RINSE: 15.0000 mL | Freq: Once | OROMUCOSAL | Status: DC

## 2022-10-11 MED ORDER — DOCUSATE SODIUM 100 MG PO CAPS
100.0000 mg | ORAL_CAPSULE | Freq: Two times a day (BID) | ORAL | Status: DC
  Administered 2022-10-11 – 2022-10-16 (×10): 100 mg via ORAL
  Filled 2022-10-11 (×10): qty 1

## 2022-10-11 MED ORDER — PHENYLEPHRINE 80 MCG/ML (10ML) SYRINGE FOR IV PUSH (FOR BLOOD PRESSURE SUPPORT)
PREFILLED_SYRINGE | INTRAVENOUS | Status: AC
  Filled 2022-10-11: qty 10

## 2022-10-11 MED ORDER — CHLORHEXIDINE GLUCONATE CLOTH 2 % EX PADS
6.0000 | MEDICATED_PAD | Freq: Every day | CUTANEOUS | Status: AC
  Administered 2022-10-12 – 2022-10-16 (×5): 6 via TOPICAL

## 2022-10-11 MED ORDER — METOCLOPRAMIDE HCL 5 MG PO TABS: 5.0000 mg | ORAL_TABLET | Freq: Three times a day (TID) | ORAL | Status: DC | PRN

## 2022-10-11 MED ORDER — LIDOCAINE 2% (20 MG/ML) 5 ML SYRINGE
INTRAMUSCULAR | Status: AC
  Filled 2022-10-11: qty 5

## 2022-10-11 MED ORDER — LACTATED RINGERS IV SOLN: INTRAVENOUS | Status: DC

## 2022-10-11 MED ORDER — FENTANYL CITRATE (PF) 250 MCG/5ML IJ SOLN
INTRAMUSCULAR | Status: AC
  Filled 2022-10-11: qty 5

## 2022-10-11 MED ORDER — ONDANSETRON HCL 4 MG PO TABS: 4.0000 mg | ORAL_TABLET | Freq: Four times a day (QID) | ORAL | Status: DC | PRN

## 2022-10-11 MED ORDER — MUPIROCIN 2 % EX OINT
1.0000 | TOPICAL_OINTMENT | Freq: Two times a day (BID) | CUTANEOUS | Status: AC
  Administered 2022-10-12 – 2022-10-16 (×10): 1 via NASAL
  Filled 2022-10-11: qty 22

## 2022-10-11 MED ORDER — MENTHOL 3 MG MT LOZG: 1.0000 | LOZENGE | OROMUCOSAL | Status: DC | PRN

## 2022-10-11 MED ORDER — TRANEXAMIC ACID-NACL 1000-0.7 MG/100ML-% IV SOLN: 1000.0000 mg | INTRAVENOUS | Status: DC

## 2022-10-11 MED ORDER — FENTANYL CITRATE (PF) 100 MCG/2ML IJ SOLN: 25.0000 ug | INTRAMUSCULAR | Status: DC | PRN

## 2022-10-11 MED ORDER — CHLORHEXIDINE GLUCONATE 0.12 % MT SOLN: 15.0000 mL | Freq: Once | OROMUCOSAL | Status: DC

## 2022-10-11 MED ORDER — TRANEXAMIC ACID-NACL 1000-0.7 MG/100ML-% IV SOLN
1000.0000 mg | Freq: Once | INTRAVENOUS | Status: AC
  Administered 2022-10-11: 1000 mg via INTRAVENOUS
  Filled 2022-10-11: qty 100

## 2022-10-11 MED ORDER — CHLORHEXIDINE GLUCONATE 0.12 % MT SOLN
15.0000 mL | Freq: Once | OROMUCOSAL | Status: AC
  Administered 2022-10-11: 15 mL via OROMUCOSAL
  Filled 2022-10-11: qty 15

## 2022-10-11 MED ORDER — PROPOFOL 10 MG/ML IV BOLUS
INTRAVENOUS | Status: AC
  Filled 2022-10-11: qty 20

## 2022-10-11 MED ORDER — ACETAMINOPHEN 160 MG/5ML PO SOLN: 325.0000 mg | ORAL | Status: DC | PRN

## 2022-10-11 MED ORDER — 0.9 % SODIUM CHLORIDE (POUR BTL) OPTIME
TOPICAL | Status: DC | PRN
  Administered 2022-10-11: 1000 mL

## 2022-10-11 MED ORDER — OXYCODONE HCL 5 MG/5ML PO SOLN: 5.0000 mg | Freq: Once | ORAL | Status: DC | PRN

## 2022-10-11 MED ORDER — CHLORHEXIDINE GLUCONATE 4 % EX SOLN
60.0000 mL | Freq: Once | CUTANEOUS | Status: DC
  Filled 2022-10-11: qty 15

## 2022-10-11 MED ORDER — ACETAMINOPHEN 325 MG PO TABS: 325.0000 mg | ORAL_TABLET | ORAL | Status: DC | PRN

## 2022-10-11 MED ORDER — FENTANYL CITRATE (PF) 250 MCG/5ML IJ SOLN
INTRAMUSCULAR | Status: DC | PRN
  Administered 2022-10-11: 25 ug via INTRAVENOUS

## 2022-10-11 MED ORDER — PROPOFOL 10 MG/ML IV BOLUS
INTRAVENOUS | Status: DC | PRN
Start: 2022-10-11 — End: 2022-10-11
  Administered 2022-10-11: 100 mg via INTRAVENOUS

## 2022-10-11 MED ORDER — SUGAMMADEX SODIUM 200 MG/2ML IV SOLN
INTRAVENOUS | Status: DC | PRN
  Administered 2022-10-11: 281.2 mg via INTRAVENOUS

## 2022-10-11 MED ORDER — METOCLOPRAMIDE HCL 5 MG/ML IJ SOLN: 5.0000 mg | Freq: Three times a day (TID) | INTRAMUSCULAR | Status: DC | PRN

## 2022-10-11 MED ORDER — ONDANSETRON HCL 4 MG/2ML IJ SOLN: 4.0000 mg | Freq: Once | INTRAMUSCULAR | Status: DC | PRN

## 2022-10-11 MED ORDER — ROCURONIUM BROMIDE 10 MG/ML (PF) SYRINGE
PREFILLED_SYRINGE | INTRAVENOUS | Status: DC | PRN
  Administered 2022-10-11: 30 mg via INTRAVENOUS
  Administered 2022-10-11: 50 mg via INTRAVENOUS

## 2022-10-11 MED ORDER — ONDANSETRON HCL 4 MG/2ML IJ SOLN
INTRAMUSCULAR | Status: DC | PRN
  Administered 2022-10-11: 4 mg via INTRAVENOUS

## 2022-10-11 MED ORDER — ONDANSETRON HCL 4 MG/2ML IJ SOLN: 4.0000 mg | Freq: Four times a day (QID) | INTRAMUSCULAR | Status: DC | PRN

## 2022-10-11 MED ORDER — PHENYLEPHRINE 80 MCG/ML (10ML) SYRINGE FOR IV PUSH (FOR BLOOD PRESSURE SUPPORT)
PREFILLED_SYRINGE | INTRAVENOUS | Status: DC | PRN
  Administered 2022-10-11 (×3): 80 ug via INTRAVENOUS

## 2022-10-11 MED ORDER — CEFAZOLIN SODIUM-DEXTROSE 2-4 GM/100ML-% IV SOLN
2.0000 g | INTRAVENOUS | Status: AC
  Administered 2022-10-11: 2 g via INTRAVENOUS
  Filled 2022-10-11: qty 100

## 2022-10-11 MED ORDER — LIDOCAINE 2% (20 MG/ML) 5 ML SYRINGE
INTRAMUSCULAR | Status: DC | PRN
  Administered 2022-10-11: 60 mg via INTRAVENOUS

## 2022-10-11 MED ORDER — ROCURONIUM BROMIDE 10 MG/ML (PF) SYRINGE
PREFILLED_SYRINGE | INTRAVENOUS | Status: AC
  Filled 2022-10-11: qty 10

## 2022-10-11 MED ORDER — MEPERIDINE HCL 25 MG/ML IJ SOLN: 6.2500 mg | INTRAMUSCULAR | Status: DC | PRN

## 2022-10-11 MED ORDER — DEXAMETHASONE SODIUM PHOSPHATE 10 MG/ML IJ SOLN
INTRAMUSCULAR | Status: DC | PRN
  Administered 2022-10-11: 5 mg via INTRAVENOUS

## 2022-10-11 MED ORDER — PHENYLEPHRINE HCL-NACL 20-0.9 MG/250ML-% IV SOLN
INTRAVENOUS | Status: DC | PRN
  Administered 2022-10-11: 25 ug/min via INTRAVENOUS

## 2022-10-11 MED ORDER — ORAL CARE MOUTH RINSE: 15.0000 mL | Freq: Once | OROMUCOSAL | Status: AC

## 2022-10-11 MED ORDER — PHENOL 1.4 % MT LIQD: 1.0000 | OROMUCOSAL | Status: DC | PRN

## 2022-10-11 SURGICAL SUPPLY — 50 items
BAG COUNTER SPONGE SURGICOUNT (BAG) ×2 IMPLANT
BAG SPNG CNTER NS LX DISP (BAG) ×1
BLADE SAW SGTL 73X25 THK (BLADE) ×2 IMPLANT
BRUSH SCRUB EZ PLAIN DRY (MISCELLANEOUS) ×4 IMPLANT
COVER SURGICAL LIGHT HANDLE (MISCELLANEOUS) ×2 IMPLANT
DRAPE INCISE IOBAN 85X60 (DRAPES) ×2 IMPLANT
DRAPE ORTHO SPLIT 77X108 STRL (DRAPES) ×2
DRAPE SURG ORHT 6 SPLT 77X108 (DRAPES) ×4 IMPLANT
DRAPE U-SHAPE 47X51 STRL (DRAPES) ×2 IMPLANT
DRSG MEPILEX POST OP 4X12 (GAUZE/BANDAGES/DRESSINGS) IMPLANT
DRSG MEPILEX POST OP 4X8 (GAUZE/BANDAGES/DRESSINGS) ×2 IMPLANT
ELECT REM PT RETURN 9FT ADLT (ELECTROSURGICAL) ×1
ELECTRODE REM PT RTRN 9FT ADLT (ELECTROSURGICAL) ×2 IMPLANT
GLOVE BIO SURGEON STRL SZ7.5 (GLOVE) ×2 IMPLANT
GLOVE BIO SURGEON STRL SZ8 (GLOVE) ×2 IMPLANT
GLOVE BIOGEL PI IND STRL 8 (GLOVE) ×4 IMPLANT
GLOVE SURG ORTHO LTX SZ7.5 (GLOVE) ×4 IMPLANT
GOWN STRL REUS W/ TWL LRG LVL3 (GOWN DISPOSABLE) ×2 IMPLANT
GOWN STRL REUS W/ TWL XL LVL3 (GOWN DISPOSABLE) ×2 IMPLANT
GOWN STRL REUS W/TWL LRG LVL3 (GOWN DISPOSABLE) ×1
GOWN STRL REUS W/TWL XL LVL3 (GOWN DISPOSABLE) ×1
HEAD FEM UNIPOLAR 45 OD STRL (Hips) IMPLANT
IMMOBILIZER KNEE 20 (SOFTGOODS) ×1
IMMOBILIZER KNEE 20 THIGH 36 (SOFTGOODS) IMPLANT
KIT BASIN OR (CUSTOM PROCEDURE TRAY) ×2 IMPLANT
KIT TURNOVER KIT B (KITS) ×2 IMPLANT
MANIFOLD NEPTUNE II (INSTRUMENTS) ×2 IMPLANT
NDL SUT 6 .5 CRC .975X.05 MAYO (NEEDLE) IMPLANT
NEEDLE MAYO TAPER (NEEDLE) ×1
NS IRRIG 1000ML POUR BTL (IV SOLUTION) ×2 IMPLANT
PACK TOTAL JOINT (CUSTOM PROCEDURE TRAY) ×2 IMPLANT
PAD ARMBOARD 7.5X6 YLW CONV (MISCELLANEOUS) ×4 IMPLANT
RETRIEVER SUT HEWSON (MISCELLANEOUS) ×2 IMPLANT
SPACER FEM TAPERED +0 12/14 (Hips) IMPLANT
STAPLER VISISTAT 35W (STAPLE) ×2 IMPLANT
STEM SUMMIT BASIC PRESSFIT SZ4 (Hips) IMPLANT
SUT ETHILON 2 0 PSLX (SUTURE) ×4 IMPLANT
SUT FIBERWIRE #2 38 T-5 BLUE (SUTURE) ×2
SUT VIC AB 0 CT1 27 (SUTURE) ×1
SUT VIC AB 0 CT1 27XBRD ANBCTR (SUTURE) IMPLANT
SUT VIC AB 1 CT1 18XCR BRD 8 (SUTURE) ×2 IMPLANT
SUT VIC AB 1 CT1 27 (SUTURE) ×2
SUT VIC AB 1 CT1 27XBRD ANBCTR (SUTURE) ×4 IMPLANT
SUT VIC AB 1 CT1 8-18 (SUTURE) ×1
SUT VIC AB 2-0 CT1 27 (SUTURE) ×2
SUT VIC AB 2-0 CT1 TAPERPNT 27 (SUTURE) ×4 IMPLANT
SUTURE FIBERWR #2 38 T-5 BLUE (SUTURE) ×4 IMPLANT
TOWEL GREEN STERILE (TOWEL DISPOSABLE) ×2 IMPLANT
TOWEL GREEN STERILE FF (TOWEL DISPOSABLE) ×2 IMPLANT
WATER STERILE IRR 1000ML POUR (IV SOLUTION) ×2 IMPLANT

## 2022-10-11 NOTE — Anesthesia Preprocedure Evaluation (Addendum)
Anesthesia Evaluation  Patient identified by MRN, date of birth, ID band Patient awake    Reviewed: Allergy & Precautions, NPO status , Patient's Chart, lab work & pertinent test results, reviewed documented beta blocker date and time   History of Anesthesia Complications Negative for: history of anesthetic complications  Airway Mallampati: III  TM Distance: >3 FB Neck ROM: Full    Dental  (+) Dental Advisory Given, Partial Upper, Partial Lower   Pulmonary asthma    Pulmonary exam normal        Cardiovascular hypertension, Pt. on medications and Pt. on home beta blockers + CAD and +CHF  Normal cardiovascular exam+ dysrhythmias Atrial Fibrillation + Valvular Problems/Murmurs MR    '24 TTE - Global hypokinesis abnoraml septal motion worse at inferior base. EF 40 to 45%. The left ventricular internal cavity size was mildly dilated. Device lead in RA/RV. Right ventricular systolic function is moderately reduced. Left atrial size was moderately dilated. Right atrial size was moderately dilated. Moderate to severe mitral valve regurgitation. Cannot r/o device lead impingement on TV leaflets as cause for severe  TR. Tricuspid valve regurgitation is severe. Aortic valve sclerosis is present, with no evidence of aortic valve stenosis.     Neuro/Psych  Hard of hearing   negative psych ROS   GI/Hepatic Neg liver ROS, hiatal hernia,GERD  Controlled and Medicated,,  Endo/Other  diabetesHypothyroidism    Renal/GU Renal InsufficiencyRenal disease     Musculoskeletal  (+) Arthritis ,    Abdominal   Peds  Hematology  (+) Blood dyscrasia, anemia   On eliquis    Anesthesia Other Findings   Reproductive/Obstetrics                             Anesthesia Physical Anesthesia Plan  ASA: 4  Anesthesia Plan: General   Post-op Pain Management: Tylenol PO (pre-op)*   Induction: Intravenous  PONV Risk Score  and Plan: 3 and Treatment may vary due to age or medical condition, Ondansetron and Dexamethasone  Airway Management Planned: Oral ETT and LMA  Additional Equipment: None  Intra-op Plan:   Post-operative Plan: Extubation in OR  Informed Consent: I have reviewed the patients History and Physical, chart, labs and discussed the procedure including the risks, benefits and alternatives for the proposed anesthesia with the patient or authorized representative who has indicated his/her understanding and acceptance.    Discussed DNR with power of attorney and Continue DNR.     Plan Discussed with: CRNA and Anesthesiologist  Anesthesia Plan Comments: (DNR only cardiac drugs.)        Anesthesia Quick Evaluation

## 2022-10-11 NOTE — Anesthesia Procedure Notes (Signed)
Procedure Name: Intubation Date/Time: 10/11/2022 3:29 PM  Performed by: Gus Puma, CRNAPre-anesthesia Checklist: Patient identified, Emergency Drugs available, Suction available and Patient being monitored Patient Re-evaluated:Patient Re-evaluated prior to induction Oxygen Delivery Method: Circle System Utilized Preoxygenation: Pre-oxygenation with 100% oxygen Induction Type: IV induction Ventilation: Mask ventilation without difficulty Laryngoscope Size: Mac and 3 Grade View: Grade I Tube type: Oral Tube size: 7.0 mm Number of attempts: 1 Airway Equipment and Method: Stylet and Oral airway Placement Confirmation: ETT inserted through vocal cords under direct vision, positive ETCO2 and breath sounds checked- equal and bilateral Secured at: 20 cm Tube secured with: Tape Dental Injury: Teeth and Oropharynx as per pre-operative assessment

## 2022-10-11 NOTE — Progress Notes (Signed)
[facility]   Surgery discussed with family at bedside  Pt awake but confused. Talking about things other than her hip fracture  Procedure explained in detail Post op plan outlined as well  Pt uses rollator at baseline   Pt has recently been evaluated by palliative care at home and family started to discuss advanced directives including DNR/DNI status  The only thing the family and pt do not want would be chest compressions.  They are ok with all other interventions   Order placed   Plan for OR later this afternoon   Mearl Latin, PA-C [phone] (C) 10/11/2022, 11:43 AM  [facility] [address] [place] [address] [phone] [name] (F)      Patient ID: Beverly Kim, female   DOB: Aug 23, 1924, 89 y.o.   MRN: [id number]

## 2022-10-11 NOTE — Progress Notes (Signed)
PROGRESS NOTE    Beverly Kim  ONG:[id number] DOB: 22-Apr-1924 DOA: 10/09/2022 PCP: Pincus Sanes, MD   Brief Narrative:  Ms. Shines a 89 yo F with hx of AF not on AC (recently stopped Eliquis), non-occlusive CAD, CHB, CKD, CHF and Saint Jude pacemaker who had an unwitnessed fall at home with right hip pain unable to bear weight.  Imaging confirms right femoral neck fracture.  Hospitalist called for admission, orthopedics called in consult.  Assessment & Plan:   Principal Problem:   Fracture of femoral neck, right (HCC) Active Problems:   Intertrochanteric fx-closed, right, initial encounter (HCC)   Chronic combined systolic and diastolic CHF (congestive heart failure) (HCC)   CKD stage 3a, GFR 45-59 ml/min (HCC)   Hypothyroidism   Essential hypertension   GERD   Diabetes (HCC)   Complete heart block (HCC)   Paroxysmal atrial fibrillation (HCC)  Intertrochanteric fx-closed, right, initial encounter (HCC) -Fracture confirmed on imaging, Dr. Lin Givens following for orthopedics - plan is for surgical repair later this afternoon 10/11/22 -Cardiology following for preop clearance given age and comorbid conditions, otherwise pain well-controlled, PT OT ongoing.     CKD stage 3a, GFR 45-59 ml/min (HCC) -At baseline, follow p.o. intake, urine output   Chronic combined systolic and diastolic CHF (congestive heart failure) (HCC) Patient appears stable w/o signs of decompensation.   Paroxysmal atrial fibrillation (HCC) Rate controlled. Not on anticoagulation at this time. Was on warfarin - transitioned to Eliquis but this too was discontinued due to "intolerance"  Complete heart block (HCC) Saint Jude pacemaker intact/functioning appropriately, cardiology following for preop clearance   Diabetes (HCC), diet controlled A1C 6.7   Continue sliding scale insulin, hypoglycemic protocol, low-carb diet  GERD Continue PPI   Intertrigo, POA -Nystatin topical powder  Essential  hypertension BP well controlled at time of admission on home medications.  Hypothyroidism Last TSH 09/03/22 5.0 -Continue levothyroxine  DVT prophylaxis: heparin injection 5,000 Units Start: 10/09/22 1400 Code Status:   Code Status: DNR Family Communication: None present  Status is: Inpatient  Dispo: The patient is from: Home              Anticipated d/c is to: To be determined, likely SNF              Anticipated d/c date is: 72+ hours              Patient currently not medically stable for discharge given need for surgical repair postoperative care, therapy and likely disposition to rehab facility  Consultants:  Orthopedic surgery  Procedures:  ORIF planned 10/11/2022  Antimicrobials:  Perioperatively  Subjective: No acute issues or events overnight, patient incredibly hard of hearing, review of systems somewhat limited due to patient's inability to communicate  Objective: Vitals:   10/10/22 1528 10/10/22 2128 10/11/22 0054 10/11/22 0322  BP: 116/68 (!) 94/37 119/67 124/72  Pulse: 68 63 68 64  Resp: 17 18  18   Temp: (!) 97.5 F (36.4 C) 98.1 F (36.7 C) 97.8 F (36.6 C) 97.7 F (36.5 C)  TempSrc: Oral Oral Oral Oral  SpO2: 99% 95% 96% 93%    Intake/Output Summary (Last 24 hours) at 10/11/2022 0716 Last data filed at 10/11/2022 4401 Gross per 24 hour  Intake 360 ml  Output 1100 ml  Net -740 ml   There were no vitals filed for this visit.  Examination:  General:  Pleasantly resting in bed, No acute distress. HEENT: Profoundly diminished hearing bilaterally Neck:  Without mass  or deformity.  Trachea is midline. Lungs:  Clear to auscultate bilaterally without rhonchi, wheeze, or rales. Heart:  Regular rate and rhythm.  Without murmurs, rubs, or gallops. Abdomen:  Soft, nontender, nondistended.  Without guarding or rebound. Extremities: Without cyanosis, clubbing, edema, or obvious deformity. Skin:  Warm and dry, no erythema.   Data Reviewed: I have personally  reviewed following labs and imaging studies   Scheduled Meds:  acetaminophen  1,000 mg Oral TID   bisoprolol  2.5 mg Oral Daily   feeding supplement  237 mL Oral BID BM   furosemide  40 mg Oral Daily   heparin  5,000 Units Subcutaneous Q8H   insulin aspart  0-15 Units Subcutaneous TID WC   levothyroxine  112 mcg Oral QAC breakfast   lidocaine  1 patch Transdermal Q24H   metolazone  2.5 mg Oral Once per day on Monday Wednesday Saturday   multivitamin with minerals  1 tablet Oral Daily   nystatin   Topical TID   pantoprazole  40 mg Oral Daily   polyethylene glycol  17 g Oral Daily   potassium chloride  10 mEq Oral Once per day on Monday Wednesday Saturday   senna  17.2 mg Oral Daily   traMADol  50 mg Oral Q6H   traZODone  50 mg Oral QHS     LOS: 2 days   Time spent:  [name], DO [facility]  If 7PM-7AM, please contact night-coverage [email or web address]  10/11/2022, 7:16 AM

## 2022-10-11 NOTE — Op Note (Signed)
10/11/2022  5:42 PM  PATIENT:  Beverly Kim  1924/07/30 female   MEDICAL RECORD NUMBER: [id number]  PRE-OPERATIVE DIAGNOSIS:  DISPLACED RIGHT FEMORAL NECK FRACTURE  POST-OPERATIVE DIAGNOSIS:  DISPLACED RIGHT FEMORAL NECK FRACTURE  PROCEDURE:  Procedure(s): UNIPOLAR HEMIARTHROPLASTY OF THE RIGHT HIP with DePuy [place] Basic #4 femoral stem, standard neck, 45 mm head  SURGEON:  Surgeon(s) and Role:    Myrene Galas, MD - Primary  PHYSICIAN ASSISTANT: Montez Morita, PA-C  ANESTHESIA:   general  EBL:  150 mL   BLOOD ADMINISTERED:none  DRAINS: none   LOCAL MEDICATIONS USED:  NONE  SPECIMEN:  No Specimen  DISPOSITION OF SPECIMEN:  N/A  COUNTS:  YES  TOURNIQUET:  * No tourniquets in log *  DICTATION: Note written in EPIC  PLAN OF CARE: Admit to inpatient   PATIENT DISPOSITION:  PACU - hemodynamically stable.   Delay start of Pharmacological VTE agent (>24hrs) due to surgical blood loss or risk of bleeding: no  BRIEF SUMMARY OF INDICATION FOR PROCEDURE:  Beverly Kim is a very pleasant 89 y.o. with dementia, who sustained an unwitnessed fall producing inability to bear weight, shortening, and external rotation of the extremity.  She was seen and evaluated with the recommendation for hemiarthroplasty. I discussed with the patient and family the risks and benefits, inclding the potential for leg length inequality, dislocation or instability, arthritis, loss of motion, DVT, PE, heart attack, stroke, and death.  Consent was given to proceed by her daughter.  BRIEF SUMMARY OF PROCEDURE:  The patient was taken to the operating room where general anesthesia was induced and after administration of preoperative antibiotics consisting of 2 g of Ancef.  We placed an in and out foley catheter with return of 300 cc. Labial retraction did result in some slight bleeding from adjacent macerated skin. She was positioned with the right side up and all prominences were padded  appropriately.  We made a 16 cm incision after the time-out, carrying dissection down to the IT band, was split in line with the skin.  Cerebellar retractor was placed and we were able to then flex and internally rotate the hip releasing the piriformis and short rotators at their insertions.  The capsule was then T'd, tagging the corners with #1 Vicryl.  The neck cut was refined using a cutting guide and then this was followed by removal of the head, which sized perfectly to 45 mm. Acetabular trials were placed, confirming this size as the best fit. Mueller and Cobra retractors were placed along the proximal femur, which was then prepared with the canal finder,  then lateralizer, followed by reamers up to #4, and the broaches, achieving  outstanding fit and fill with the #4 broach.  The calcar reamer was used to refine the cut as we were using a low demand stem.  The canal was irrigated thoroughly and the acetabulum once again searched multiple times for fragments and irrigated thoroughly.  Trial components were placed and the patient had outstanding stability in combine 90 degrees of flexion, adduction, and internal rotation as well as in external rotation and extension.  Consequently, actual components were placed.  My assistant Montez Morita, was necessary for delivery and control of the proximal femur during preparation, also during relocation and dislocation of the trial components as well as relocation of the actual components.  He assisted me with wound closure as well.  I did repair the capsule with #1 Vicryl and then used #2 FiberWire through bone tunnels  to repair the short rotators and piriformis.  This was followed by a #1 Vicryl for the IT band and lastly 2-0 Vicryl and nylon for the subcutaneous and skin.  Sterile gently compressive dressing was applied.  The patient was awakened from anesthesia and transported to the PACU in stable condition.  PROGNOSIS:  The patient will be  weightbearing as tolerated with posterior hip precautions.  Patient has an elevated risk of complications related to age and mobility.  Beverly Kim remains on the Medical Service and will be on DVT prophylaxis mechanically and with Lovenox while in the hospital, but will likely not be a candidate for long-term prophylaxis given the dementia. Plan is for eventual return home with her daughter but interval stay in SNF or Rehab.     Beverly Kim, M.D.

## 2022-10-11 NOTE — Transfer of Care (Signed)
Immediate Anesthesia Transfer of Care Note  Patient: Beverly Kim  Procedure(s) Performed: ARTHROPLASTY BIPOLAR HIP (HEMIARTHROPLASTY) (Right: Hip)  Patient Location: PACU  Anesthesia Type:General  Level of Consciousness: awake  Airway & Oxygen Therapy: Patient Spontanous Breathing and Patient connected to nasal cannula oxygen  Post-op Assessment: Report given to RN and Post -op Vital signs reviewed and stable  Post vital signs: Reviewed and stable  Last Vitals:  Vitals Value Taken Time  BP 149/98 10/11/22 1751  Temp    Pulse 63 10/11/22 1756  Resp 13 10/11/22 1756  SpO2 93 % 10/11/22 1756  Vitals shown include unfiled device data.  Last Pain:  Vitals:   10/11/22 1248  TempSrc: Oral  PainSc:          Complications: No notable events documented.

## 2022-10-12 ENCOUNTER — Encounter (HOSPITAL_COMMUNITY): Payer: Self-pay | Admitting: Orthopedic Surgery

## 2022-10-12 DIAGNOSIS — S72141A Displaced intertrochanteric fracture of right femur, initial encounter for closed fracture: Secondary | ICD-10-CM | POA: Diagnosis not present

## 2022-10-12 DIAGNOSIS — I5042 Chronic combined systolic (congestive) and diastolic (congestive) heart failure: Secondary | ICD-10-CM | POA: Diagnosis not present

## 2022-10-12 DIAGNOSIS — S72001A Fracture of unspecified part of neck of right femur, initial encounter for closed fracture: Secondary | ICD-10-CM | POA: Diagnosis not present

## 2022-10-12 DIAGNOSIS — N1831 Chronic kidney disease, stage 3a: Secondary | ICD-10-CM | POA: Diagnosis not present

## 2022-10-12 LAB — CBC
HCT: 31.5 % — ABNORMAL LOW (ref 36.0–46.0)
Hemoglobin: 10.4 g/dL — ABNORMAL LOW (ref 12.0–15.0)
MCH: 32.2 pg (ref 26.0–34.0)
MCHC: 33 g/dL (ref 30.0–36.0)
MCV: 97.5 fL (ref 80.0–100.0)
Platelets: 127 10*3/uL — ABNORMAL LOW (ref 150–400)
RBC: 3.23 MIL/uL — ABNORMAL LOW (ref 3.87–5.11)
RDW: 14.2 % (ref 11.5–15.5)
WBC: 7.3 10*3/uL (ref 4.0–10.5)
nRBC: 0 % (ref 0.0–0.2)

## 2022-10-12 LAB — GLUCOSE, CAPILLARY
Glucose-Capillary: 167 mg/dL — ABNORMAL HIGH (ref 70–99)
Glucose-Capillary: 168 mg/dL — ABNORMAL HIGH (ref 70–99)
Glucose-Capillary: 159 mg/dL — ABNORMAL HIGH (ref 70–99)
Glucose-Capillary: 144 mg/dL — ABNORMAL HIGH (ref 70–99)

## 2022-10-12 MED ORDER — SODIUM CHLORIDE 0.9 % IV SOLN: INTRAVENOUS | Status: DC

## 2022-10-12 MED ORDER — TRAMADOL HCL 50 MG PO TABS
50.0000 mg | ORAL_TABLET | Freq: Four times a day (QID) | ORAL | Status: DC | PRN
  Administered 2022-10-13 – 2022-10-16 (×10): 50 mg via ORAL
  Filled 2022-10-12 (×10): qty 1

## 2022-10-12 MED ORDER — ENOXAPARIN SODIUM 30 MG/0.3ML IJ SOSY
30.0000 mg | PREFILLED_SYRINGE | Freq: Every day | INTRAMUSCULAR | Status: DC
  Administered 2022-10-12 – 2022-10-16 (×5): 30 mg via SUBCUTANEOUS
  Filled 2022-10-12 (×5): qty 0.3

## 2022-10-12 MED ORDER — OXYCODONE HCL 5 MG PO TABS
5.0000 mg | ORAL_TABLET | Freq: Four times a day (QID) | ORAL | Status: DC | PRN
  Administered 2022-10-13 – 2022-10-15 (×4): 5 mg via ORAL
  Filled 2022-10-12 (×4): qty 1

## 2022-10-12 NOTE — Progress Notes (Signed)
Pharmacy note  89 yo female on apixaban recently for history of afib. She is here s/p fall and R hip fracture s/p repair 8/8.   Of note she stopped apixaban recently for nosebleeds and recent GIB. Per chart notes plans were to discuss oral anticoagulation plans with her cardiologist. I discussed w/ Dr. Natale Milch and no plans to restart apixaban now  Plan -Start lovenox 30mg  sq daily for VTE prophylaxis  Harland German, PharmD Clinical Pharmacist **Pharmacist phone directory can now be found on [email or web address] ([email or web address]).  Listed under [facility].

## 2022-10-12 NOTE — Progress Notes (Signed)
Inpatient Rehab Admissions Coordinator:   Per therapy recommendations pt was screened for CIR by Estill Dooms, PT, DPT.  Unfortunately, insurance unlikely to approve this pt for CIR. Recommend f/u at lower level of care.   Estill Dooms, PT, DPT Admissions Coordinator [phone] 10/12/22  1:05 PM

## 2022-10-12 NOTE — Progress Notes (Addendum)
Orthopaedic Trauma Service Progress Note  Patient ID: Beverly Kim MRN: [id number] DOB/AGE: [birth year] 89 y.o.  Subjective:  Ortho issues stable Mild pain  No other complaints  Daughter at bedside  Daughter agreeable to SNF  Vitamin d levels look good  Lives with daughter Rollator at baseline  ROS As above  Objective:   VITALS:   Vitals:   10/11/22 1917 10/12/22 0012 10/12/22 0610 10/12/22 0725  BP: 138/70 135/88 132/70 127/64  Pulse: 66 68 65 65  Resp: 20 18 18    Temp: 97.6 F (36.4 C)  98.5 F (36.9 C) 97.7 F (36.5 C)  TempSrc: Oral   Oral  SpO2: 100% 96% 95% 100%  Weight:      Height:        Estimated body mass index is 28.35 kg/m as calculated from the following:   Height as of this encounter: 5\' 2"  (1.575 m).   Weight as of this encounter: 70.3 kg.   Intake/Output      08/08 0701 08/09 0700 08/09 0701 08/10 0700   P.O. 120    I.V. (mL/kg) 650 (9.2)    Total Intake(mL/kg) 770 (11)    Urine (mL/kg/hr)     Blood 200    Total Output 200    Net +570         Urine Occurrence 1 x      LABS  Results for orders placed or performed during the hospital encounter of 10/09/22 (from the past 24 hour(s))  Glucose, capillary     Status: Abnormal   Collection Time: 10/11/22  5:57 PM  Result Value Ref Range   Glucose-Capillary 113 (H) 70 - 99 mg/dL  Glucose, capillary     Status: Abnormal   Collection Time: 10/11/22  9:47 PM  Result Value Ref Range   Glucose-Capillary 159 (H) 70 - 99 mg/dL  VITAMIN D 25 Hydroxy (Vit-D Deficiency, Fractures)     Status: None   Collection Time: 10/12/22  3:31 AM  Result Value Ref Range   Vit D, 25-Hydroxy 43.33 30 - 100 ng/mL  Basic metabolic panel     Status: Abnormal   Collection Time: 10/12/22  3:31 AM  Result Value Ref Range   Sodium 127 (L) 135 - 145 mmol/L   Potassium 4.4 3.5 - 5.1 mmol/L   Chloride 93 (L) 98 - 111 mmol/L   CO2 23 22  - 32 mmol/L   Glucose, Bld 178 (H) 70 - 99 mg/dL   BUN 37 (H) 8 - 23 mg/dL   Creatinine, Ser 4.09 (H) 0.44 - 1.00 mg/dL   Calcium 8.6 (L) 8.9 - 10.3 mg/dL   GFR, Estimated 32 (L) >60 mL/min   Anion gap 11 5 - 15  Glucose, capillary     Status: Abnormal   Collection Time: 10/12/22  7:26 AM  Result Value Ref Range   Glucose-Capillary 167 (H) 70 - 99 mg/dL  CBC     Status: Abnormal   Collection Time: 10/12/22  8:06 AM  Result Value Ref Range   WBC 7.3 4.0 - 10.5 K/uL   RBC 3.23 (L) 3.87 - 5.11 MIL/uL   Hemoglobin 10.4 (L) 12.0 - 15.0 g/dL   HCT 81.1 (L) 91.4 - 78.2 %   MCV 97.5 80.0 - 100.0 fL   MCH 32.2 26.0 -  34.0 pg   MCHC 33.0 30.0 - 36.0 g/dL   RDW 55.7 32.2 - 02.5 %   Platelets 127 (L) 150 - 400 K/uL   nRBC 0.0 0.0 - 0.2 %  Glucose, capillary     Status: Abnormal   Collection Time: 10/12/22 11:08 AM  Result Value Ref Range   Glucose-Capillary 168 (H) 70 - 99 mg/dL     PHYSICAL EXAM:   Gen: sitting up in bed, NAD, awake but confused. Pleasant. HOH Lungs: unlabored Ext:       Right Lower Extremity   Dressing clean, dry and intact  Ext warm   + DP pulse  Distal motor and sensory functions appear to be grossly intact   Some difficulty following commands due to hearing Knee immobilizer removed by myself   Assessment/Plan: 1 Day Post-Op   Anti-infectives (From admission, onward)    Start     Dose/Rate Route Frequency Ordered Stop   10/11/22 1130  ceFAZolin (ANCEF) IVPB 2g/100 mL premix        2 g 200 mL/hr over 30 Minutes Intravenous On call to O.R. 10/11/22 4270 10/11/22 1530     .  POD/HD#: 1  89 y/o female s/p R hip hemiarthroplasty for displaced R femoral neck fracture   -R hip hemiarthroplasty for R femoral neck fracture  Weightbearing WBAT R leg with assistance    ROM/Activity   Posterior hip precaution   Increase activity as tolerated   Wound care   Daily wound care to R hip starting on 10/13/2022   SW for SNF search    Ice PRN    Dc knee  immobilizer     - Pain management:  Multimodal   Minimize narcotics   - ABL anemia/Hemodynamics  Stable   Monitor   - Medical issues   Per primary   - DVT/PE prophylaxis:  Recommend lovenox for 21 days post op  - ID:   Periop abx  - Metabolic Bone Disease:  Fragility fracture----> osteoporosis  Vitamin d levels look good - Activity:  As above  - Impediments to fracture healing:  Osteoporosis  - Dispo:  Therapy evals   TOC for SNF search   Follow up with ortho in 2 weeks    Mearl Latin, PA-C [phone] (C) 10/12/2022, 1:39 PM  [facility] [address] [place] [address] [phone] [name] (F)    After 5pm and on the weekends please log on to Amion, go to orthopaedics and the look under the [facility] Call for the provider(s) on call. You can also call our office at [phone] and then follow the prompts to be connected to the call team.  Patient ID: Beverly Kim, female   DOB: 1924-12-18, 89 y.o.   MRN: [id number]

## 2022-10-12 NOTE — Discharge Instructions (Signed)
Orthopaedic Trauma Service Discharge Instructions   General Discharge Instructions  Orthopaedic Injuries:  Right femoral neck fracture treated with right hip hemiarthroplasty (partial hip replacement)  WEIGHT BEARING STATUS: weightbearing as tolerated R leg with assistance  RANGE OF MOTION/ACTIVITY: posterior hip precautions R hip   Bone health: a femoral neck fracture is a fragility fracture which is suggestive of osteoporosis.  Your vitamin d levels look good   Review the following resource for additional information regarding bone health  [email or web address]  Wound Care:daily wound care   Discharge Wound Care Instructions  Do NOT apply any ointments, solutions or lotions to pin sites or surgical wounds.  These prevent needed drainage and even though solutions like hydrogen peroxide kill bacteria, they also damage cells lining the pin sites that help fight infection.  Applying lotions or ointments can keep the wounds moist and can cause them to breakdown and open up as well. This can increase the risk for infection. When in doubt call the office.  Surgical incisions should be dressed daily.  If any drainage is noted, use one layer of adaptic or Mepitel, then gauze and tape. Alternatively you can use a silicone foam dressing such as a mepilex   [email or web address]  [email or web address]  These dressing supplies should be available at local medical supply stores ([employer], [employer], etc). They are not usually carried at places like [facility], [facility], [employer], etc  Once the incision is completely dry and without drainage, it may be left open to air out.  Showering may begin 36-48 hours later.  Cleaning gently with  soap and water.   Diet: as you were eating previously.  Can use over the counter stool softeners and bowel preparations, such as Miralax, to help with bowel movements.  Narcotics can be constipating.  Be sure to drink plenty of fluids  PAIN MEDICATION USE AND EXPECTATIONS  You have likely been given narcotic medications to help control your pain.  After a traumatic event that results in an fracture (broken bone) with or without surgery, it is ok to use narcotic pain medications to help control one's pain.  We understand that everyone responds to pain differently and each individual patient will be evaluated on a regular basis for the continued need for narcotic medications. Ideally, narcotic medication use should last no more than 6-8 weeks (coinciding with fracture healing).   As a patient it is your responsibility as well to monitor narcotic medication use and report the amount and frequency you use these medications when you come to your office visit.   We would also advise that if you are using narcotic medications, you should take a dose prior to therapy to maximize you participation.  IF YOU ARE ON NARCOTIC MEDICATIONS IT IS NOT PERMISSIBLE TO OPERATE A MOTOR VEHICLE (MOTORCYCLE/CAR/TRUCK/MOPED) OR HEAVY MACHINERY DO NOT MIX NARCOTICS WITH OTHER CNS (CENTRAL NERVOUS SYSTEM) DEPRESSANTS SUCH AS ALCOHOL   POST-OPERATIVE OPIOID TAPER INSTRUCTIONS: It is important to wean off of your opioid medication as soon as possible. If you do not need pain medication after your surgery it is ok to stop day one. Opioids include: Codeine, Hydrocodone(Norco, Vicodin), Oxycodone(Percocet, oxycontin) and hydromorphone amongst others.  Long term and even short term use of opiods can cause: Increased pain response Dependence Constipation Depression Respiratory depression And more.  Withdrawal symptoms can include Flu like symptoms Nausea, vomiting And more Techniques to manage these symptoms Hydrate  well Eat regular healthy meals Stay active Use relaxation techniques(deep breathing, meditating, yoga) Do  Not substitute Alcohol to help with tapering If you have been on opioids for less than two weeks and do not have pain than it is ok to stop all together.  Plan to wean off of opioids This plan should start within one week post op of your fracture surgery  Maintain the same interval or time between taking each dose and first decrease the dose.  Cut the total daily intake of opioids by one tablet each day Next start to increase the time between doses. The last dose that should be eliminated is the evening dose.    STOP SMOKING OR USING NICOTINE PRODUCTS!!!!  As discussed nicotine severely impairs your body's ability to heal surgical and traumatic wounds but also impairs bone healing.  Wounds and bone heal by forming microscopic blood vessels (angiogenesis) and nicotine is a vasoconstrictor (essentially, shrinks blood vessels).  Therefore, if vasoconstriction occurs to these microscopic blood vessels they essentially disappear and are unable to deliver necessary nutrients to the healing tissue.  This is one modifiable factor that you can do to dramatically increase your chances of healing your injury.    (This means no smoking, no nicotine gum, patches, etc)  DO NOT USE NONSTEROIDAL ANTI-INFLAMMATORY DRUGS (NSAID'S)  Using products such as Advil (ibuprofen), Aleve (naproxen), Motrin (ibuprofen) for additional pain control during fracture healing can delay and/or prevent the healing response.  If you would like to take over the counter (OTC) medication, Tylenol (acetaminophen) is ok.  However, some narcotic medications that are given for pain control contain acetaminophen as well. Therefore, you should not exceed more than 4000 mg of tylenol in a day if you do not have liver disease.  Also note that there are may OTC medicines, such as cold medicines and allergy medicines that my contain tylenol  as well.  If you have any questions about medications and/or interactions please ask your doctor/PA or your pharmacist.      ICE AND ELEVATE INJURED/OPERATIVE EXTREMITY  Using ice and elevating the injured extremity above your heart can help with swelling and pain control.  Icing in a pulsatile fashion, such as 20 minutes on and 20 minutes off, can be followed.    Do not place ice directly on skin. Make sure there is a barrier between to skin and the ice pack.    Using frozen items such as frozen peas works well as the conform nicely to the are that needs to be iced.  USE AN ACE WRAP OR TED HOSE FOR SWELLING CONTROL  In addition to icing and elevation, Ace wraps or TED hose are used to help limit and resolve swelling.  It is recommended to use Ace wraps or TED hose until you are informed to stop.    When using Ace Wraps start the wrapping distally (farthest away from the body) and wrap proximally (closer to the body)   Example: If you had surgery on your leg or thing and you do not have a splint on, start the ace wrap at the toes and work your way up to the thigh        If you had surgery on your upper extremity and do not have a splint on, start the ace wrap at your fingers and work your way up to the upper arm  IF YOU ARE IN A SPLINT OR CAST DO NOT REMOVE IT FOR ANY REASON   If your splint gets wet for any reason please contact the office immediately. You may shower in  your splint or cast as long as you keep it dry.  This can be done by wrapping in a cast cover or garbage back (or similar)  Do Not stick any thing down your splint or cast such as pencils, money, or hangers to try and scratch yourself with.  If you feel itchy take benadryl as prescribed on the bottle for itching  IF YOU ARE IN A CAM BOOT (BLACK BOOT)  You may remove boot periodically. Perform daily dressing changes as noted below.  Wash the liner of the boot regularly and wear a sock when wearing the boot. It is recommended that  you sleep in the boot until told otherwise    Call office for the following: Temperature greater than 101F Persistent nausea and vomiting Severe uncontrolled pain Redness, tenderness, or signs of infection (pain, swelling, redness, odor or green/yellow discharge around the site) Difficulty breathing, headache or visual disturbances Hives Persistent dizziness or light-headedness Extreme fatigue Any other questions or concerns you may have after discharge  In an emergency, call 911 or go to an Emergency Department at a nearby hospital  HELPFUL INFORMATION  If you had a block, it will wear off between 8-24 hrs postop typically.  This is period when your pain may go from nearly zero to the pain you would have had postop without the block.  This is an abrupt transition but nothing dangerous is happening.  You may take an extra dose of narcotic when this happens.  You should wean off your narcotic medicines as soon as you are able.  Most patients will be off or using minimal narcotics before their first postop appointment.   We suggest you use the pain medication the first night prior to going to bed, in order to ease any pain when the anesthesia wears off. You should avoid taking pain medications on an empty stomach as it will make you nauseous.  Do not drink alcoholic beverages or take illicit drugs when taking pain medications.  In most states it is against the law to drive while you are in a splint or sling.  And certainly against the law to drive while taking narcotics.  You may return to work/school in the next couple of days when you feel up to it.   Pain medication may make you constipated.  Below are a few solutions to try in this order: Decrease the amount of pain medication if you aren't having pain. Drink lots of decaffeinated fluids. Drink prune juice and/or each dried prunes  If the first 3 don't work start with additional solutions Take Colace - an over-the-counter stool  softener Take Senokot - an over-the-counter laxative Take Miralax - a stronger over-the-counter laxative     CALL THE OFFICE WITH ANY QUESTIONS OR CONCERNS: [phone]   VISIT OUR WEBSITE FOR ADDITIONAL INFORMATION: [email or web address]

## 2022-10-12 NOTE — Progress Notes (Signed)
PROGRESS NOTE    Beverly Kim  ZOX:[id number] DOB: Dec 06, 1924 DOA: 10/09/2022 PCP: Pincus Sanes, MD   Brief Narrative:  Ms. Schoenecker a 89 yo F with hx of AF not on AC (recently stopped Eliquis), non-occlusive CAD, CHB, CKD, CHF and Saint Jude pacemaker who had an unwitnessed fall at home with right hip pain unable to bear weight.  Imaging confirms right femoral neck fracture.  Hospitalist called for admission, orthopedics called in consult.  Assessment & Plan:   Principal Problem:   Fracture of femoral neck, right (HCC) Active Problems:   Intertrochanteric fx-closed, right, initial encounter (HCC)   Chronic combined systolic and diastolic CHF (congestive heart failure) (HCC)   CKD stage 3a, GFR 45-59 ml/min (HCC)   Hypothyroidism   Essential hypertension   GERD   Diabetes (HCC)   Complete heart block (HCC)   Paroxysmal atrial fibrillation (HCC)  Intertrochanteric fx-closed, right, initial encounter (HCC) -Fracture confirmed on imaging, Dr. Carola Frost performed hemiarthroplasty 10/11/22, patient tolerated well -Pain currently well controlled; unfortunately patient does have known issues with hallucinations while on narcotics - will attempt to wean aggressively as tolerated. -Avoid high dose/around the clock NSAIDs given CKD3a. -Cardiology initially following for preop clearance given age and comorbid conditions (now signed off), otherwise pain well-controlled, PT OT ongoing.     CKD stage 3a, GFR 45-59 ml/min (HCC) -At baseline (creatinine 1.5) follow p.o. intake, urine output -Change to NS until PO intake appropriate   Hyponatremia, iatrogenic -Patient placed on 1/2NS with downtrending Sodium - transition to NS until PO intake improves -Follow repeat labs  Chronic combined systolic and diastolic CHF (congestive heart failure) (HCC) Patient appears stable w/o signs of decompensation.   Paroxysmal atrial fibrillation (HCC) Rate controlled. Not on anticoagulation at this time. Was  on warfarin - transitioned to Eliquis but this too was discontinued due to "intolerance"  Complete heart block (HCC) Saint Jude pacemaker intact/functioning appropriately, cardiology evaluated for preop clearance as above   Diabetes (HCC), diet controlled A1C 6.7   Continue sliding scale insulin, hypoglycemic protocol, low-carb diet  GERD Continue PPI   Intertrigo, POA -Nystatin topical powder  Essential hypertension BP well controlled at time of admission on home medications.  Hypothyroidism Last TSH 09/03/22 5.0 -Continue levothyroxine  DVT prophylaxis: SCDs Start: 10/11/22 1856 Code Status:   Code Status: DNR Family Communication: None present  Status is: Inpatient  Dispo: The patient is from: Home              Anticipated d/c is to: To be determined, likely SNF              Anticipated d/c date is: 72+ hours              Patient currently not medically stable for discharge given need for surgical repair postoperative care, therapy and likely disposition to rehab facility  Consultants:  Orthopedic surgery  Procedures:  ORIF planned 10/11/2022  Antimicrobials:  Perioperatively  Subjective: No acute issues or events overnight, patient incredibly hard of hearing but notes hearing 'things that aren't in the room' and then changes topics to talk about her family. ROS negative other than postop pain/stiffness.  Objective: Vitals:   10/11/22 1825 10/11/22 1917 10/12/22 0012 10/12/22 0610  BP: 119/74 138/70 135/88 132/70  Pulse: 64 66 68 65  Resp: 17 20 18 18   Temp: (!) 97.1 F (36.2 C) 97.6 F (36.4 C)  98.5 F (36.9 C)  TempSrc:  Oral    SpO2: 97% 100%  96% 95%  Weight:      Height:        Intake/Output Summary (Last 24 hours) at 10/12/2022 0714 Last data filed at 10/11/2022 2130 Gross per 24 hour  Intake 770 ml  Output 200 ml  Net 570 ml   Filed Weights   10/11/22 1248  Weight: 70.3 kg    Examination:  General:  Pleasantly resting in bed, No acute  distress. HEENT: Profoundly diminished hearing bilaterally Neck:  Without mass or deformity.  Trachea is midline. Lungs:  Clear to auscultate bilaterally without rhonchi, wheeze, or rales. Heart:  Regular rate and rhythm.  Without murmurs, rubs, or gallops. Abdomen:  Soft, nontender, nondistended.  Without guarding or rebound. Extremities: R hip bandage clean/dry/intact Skin:  Warm and dry, no erythema.  Data Reviewed: I have personally reviewed following labs and imaging studies   Scheduled Meds:  acetaminophen  1,000 mg Oral TID   bisoprolol  2.5 mg Oral Daily   Chlorhexidine Gluconate Cloth  6 each Topical Q0600   docusate sodium  100 mg Oral BID   feeding supplement  237 mL Oral BID BM   furosemide  40 mg Oral Daily   insulin aspart  0-15 Units Subcutaneous TID WC   levothyroxine  112 mcg Oral QAC breakfast   lidocaine  1 patch Transdermal Q24H   metolazone  2.5 mg Oral Once per day on Monday Wednesday Saturday   multivitamin with minerals  1 tablet Oral Daily   mupirocin ointment  1 Application Nasal BID   nystatin   Topical TID   pantoprazole  40 mg Oral Daily   polyethylene glycol  17 g Oral Daily   potassium chloride  10 mEq Oral Once per day on Monday Wednesday Saturday   senna  17.2 mg Oral Daily   traMADol  50 mg Oral Q6H   traZODone  50 mg Oral QHS     LOS: 3 days   Time spent:  [name], DO [facility]  If 7PM-7AM, please contact night-coverage [email or web address]  10/12/2022, 7:14 AM

## 2022-10-12 NOTE — Consult Note (Signed)
   [facility] CM Inpatient Consult   10/12/2022  Beverly Kim August 19, 1924 [id number]  [facility] [[facility]]  Accountable Care Organization [ACO] Patient: [name] [patient showing engage with [facility] with HTA per [employer]]  Primary Care Provider:  Pincus Sanes, MD with  at [facility] which is listed to provide the transition of care follow up   Patient is currently showing as active with [facility] [[facility]] Care Management for chronic disease management services.  Patient has been engaged by a Airline pilot on behalf of [facility].  The community based plan of care has focused on disease management and community resource support.    Plan: Will update Community Airline pilot of admission and any new follow up needs as known and alert to [facility] follow up.  Will follow with Inpatient Transition Of Care [TOC] team member to make aware that [facility] Care Management following.   Of note, [facility] Care Management services does not replace or interfere with any services that are needed or arranged by inpatient [facility] care management team.   For additional questions or referrals please contact:  Charlesetta Shanks, RN BSN CCM [facility]  [phone] business mobile phone Toll free office [phone]  *Concierge Line  [phone] Fax number: [phone] [email or web address]

## 2022-10-12 NOTE — Anesthesia Postprocedure Evaluation (Signed)
Anesthesia Post Note  Patient: SUPRIYA KLAMMER  Procedure(s) Performed: ARTHROPLASTY BIPOLAR HIP (HEMIARTHROPLASTY) (Right: Hip)     Patient location during evaluation: PACU Anesthesia Type: General Level of consciousness: awake and alert Pain management: pain level controlled Vital Signs Assessment: post-procedure vital signs reviewed and stable Respiratory status: spontaneous breathing, nonlabored ventilation, respiratory function stable and patient connected to nasal cannula oxygen Cardiovascular status: blood pressure returned to baseline and stable Postop Assessment: no apparent nausea or vomiting Anesthetic complications: no  No notable events documented.  Last Vitals:  Vitals:   10/12/22 0012 10/12/22 0610  BP: 135/88 132/70  Pulse: 68 65  Resp: 18 18  Temp:  36.9 C  SpO2: 96% 95%    Last Pain:  Vitals:   10/12/22 0610  TempSrc:   PainSc: 6                   L 

## 2022-10-12 NOTE — Evaluation (Signed)
Physical Therapy Evaluation Patient Details Name: Beverly Kim MRN: [id number] DOB: Aug 01, 1924 Today's Date: 10/12/2022  History of Present Illness  The pt is a 89 yo female presenting 8/6 with R hip pain after a fall at home. Pt found to have R hip fx, now s/p unipolar hemiarthroplasty of R hip on 8/8. PMH includes: anemia, pacemaker, CKD III, HFpEF, hearing loss, HLD, HTN, hypothyroidism, low back pain, LBBB, and PAF.   Clinical Impression  Pt in bed upon arrival of PT, agreeable to evaluation at this time. Prior to admission the pt was independent with use of rollator in her home, and per her daughter was independent with all ADLs and some IADLs. The pt now presents with limitations in functional mobility, RLE ROM, strength, activity tolerance, seated balance, cognition, and command following due to above dx, and will continue to benefit from skilled PT to address these deficits. The pt required totalA to transition to sitting EOB due to poor command following this morning and significant pain levels with movement of RLE. The pt was able to tolerate sitting EOB for an extended period of time (15-20 min), but needed min-modA to maintain sitting balance due to poor postural awareness and strength. Given pt's significant independence prior to this fall and good family support available, the pt would benefit from intensive therapies to regain maximal independence prior to return home. Anticipate that as pt's pain better under control she will be able to tolerate 3hrs of therapy each day.          If plan is discharge home, recommend the following: Two people to help with walking and/or transfers;Two people to help with bathing/dressing/bathroom;Direct supervision/assist for medications management;Direct supervision/assist for financial management;Assist for transportation;Help with stairs or ramp for entrance;Supervision due to cognitive status   Can travel by private vehicle        Equipment  Recommendations  (defer to post acute, likely WC)  Recommendations for Other Services  Rehab consult    Functional Status Assessment Patient has had a recent decline in their functional status and demonstrates the ability to make significant improvements in function in a reasonable and predictable amount of time.     Precautions / Restrictions Precautions Precautions: Posterior Hip Precaution Booklet Issued: Yes (comment) Precaution Comments: daughter present and educated, pt unable to retain education Required Braces or Orthoses: Knee Immobilizer - Right Knee Immobilizer - Right: Other (comment) (not in orderset or MD notes, but on the patient upon our arrival) Restrictions Weight Bearing Restrictions: Yes RLE Weight Bearing: Weight bearing as tolerated Other Position/Activity Restrictions: posterior hip precautions      Mobility  Bed Mobility Overal bed mobility: Needs Assistance Bed Mobility: Supine to Sit, Sit to Supine     Supine to sit: Total assist, +2 for physical assistance, +2 for safety/equipment Sit to supine: +2 for physical assistance, +2 for safety/equipment, Total assist   General bed mobility comments: helicopter method with use of pads, total A for scooting anteriorly    Transfers                   General transfer comment: deferred due to pt inability to follow commands and significant pain.       Balance Overall balance assessment: Needs assistance Sitting-balance support: Single extremity supported, Feet supported Sitting balance-Leahy Scale: Poor Sitting balance - Comments: Pt needing a form of external support to maintain static sitting balance, she props on her L elbow and leans back. Needs Min to Mod A to  help maintain balance Postural control: Posterior lean, Left lateral lean                                   Pertinent Vitals/Pain Pain Assessment Pain Assessment: Faces Faces Pain Scale: Hurts even more Pain Location:  R hip with movement Pain Descriptors / Indicators: Aching, Discomfort, Grimacing, Guarding Pain Intervention(s): Limited activity within patient's tolerance, Monitored during session, Repositioned, Premedicated before session    Home Living Family/patient expects to be discharged to:: Private residence Living Arrangements: Children Available Help at Discharge: Family;Available PRN/intermittently (daughter works from home) Type of Home: House Home Access: Stairs to enter Entrance Stairs-Rails: Left Entrance Stairs-Number of Steps: 1   Home Layout: One level Home Equipment: Rollator (4 wheels);Rolling Walker (2 wheels);Shower seat;Grab bars - toilet;Grab bars - tub/shower;Toilet riser Additional Comments: Daughter works from home    Prior Function Prior Level of Function : Independent/Modified Independent             Mobility Comments: uses rollator, no other falls. uses lift chair ADLs Comments: independent with ADLs and medication management. "fiercely independent"     Extremity/Trunk Assessment   Upper Extremity Assessment Upper Extremity Assessment: Defer to OT evaluation    Lower Extremity Assessment Lower Extremity Assessment: Generalized weakness;Difficult to assess due to impaired cognition (pt resisting PROM of R knee and ankle, suspect due to  confusion and pain, did wiggle her toes at times to command. The pt did not allow for formal MMT of either LE. R KI on upon arrival)    Cervical / Trunk Assessment Cervical / Trunk Assessment: Kyphotic  Communication   Communication Communication: Hearing impairment;Difficulty following commands/understanding Following commands: Follows one step commands inconsistently Cueing Techniques: Verbal cues;Gestural cues;Visual cues;Tactile cues  Cognition Arousal: Alert Behavior During Therapy: WFL for tasks assessed/performed Overall Cognitive Status: Difficult to assess                                 General  Comments: Pt very HOH despite using hearing aids and presents with tangential speech. Per RN she is also hallucinating from medications, can follow very simple commands with multi modal cues        General Comments General comments (skin integrity, edema, etc.): VSS on 2L O2    Exercises General Exercises - Lower Extremity Ankle Circles/Pumps: PROM, Both, 10 reps (pt resisting at times) Long Arc Quad: PROM, Both, 10 reps, Seated (pt resisting at times) Heel Slides: PROM, Both, 10 reps, Supine (pt resisting at times)   Assessment/Plan    PT Assessment Patient needs continued PT services  PT Problem List Decreased strength;Decreased range of motion;Decreased activity tolerance;Decreased balance;Decreased mobility;Decreased coordination;Decreased cognition;Decreased safety awareness;Pain       PT Treatment Interventions DME instruction;Gait training;Functional mobility training;Therapeutic activities;Therapeutic exercise;Balance training;Patient/family education    PT Goals (Current goals can be found in the Care Plan section)  Acute Rehab PT Goals Patient Stated Goal: none stated by pt, to get back to independence per daughter PT Goal Formulation: With patient/family Time For Goal Achievement: 10/26/22 Potential to Achieve Goals: Good    Frequency Min 1X/week     Co-evaluation PT/OT/SLP Co-Evaluation/Treatment: Yes Reason for Co-Treatment: Necessary to address cognition/behavior during functional activity;For patient/therapist safety PT goals addressed during session: Mobility/safety with mobility;Balance;Proper use of DME;Strengthening/ROM OT goals addressed during session: ADL's and self-care  AM-PAC PT "6 Clicks" Mobility  Outcome Measure Help needed turning from your back to your side while in a flat bed without using bedrails?: Total Help needed moving from lying on your back to sitting on the side of a flat bed without using bedrails?: Total Help needed moving to  and from a bed to a chair (including a wheelchair)?: Total Help needed standing up from a chair using your arms (e.g., wheelchair or bedside chair)?: Total Help needed to walk in hospital room?: Total Help needed climbing 3-5 steps with a railing? : Total 6 Click Score: 6    End of Session Equipment Utilized During Treatment: Oxygen Activity Tolerance: Patient limited by pain Patient left: in bed;with call bell/phone within reach;with bed alarm set;with family/visitor present Nurse Communication: Mobility status PT Visit Diagnosis: Other abnormalities of gait and mobility (R26.89);Unsteadiness on feet (R26.81);Muscle weakness (generalized) (M62.81);Pain Pain - Right/Left: Right Pain - part of body: Hip;Leg    Time: 1610-9604 PT Time Calculation (min) (ACUTE ONLY): 49 min   Charges:   PT Evaluation $PT Eval Moderate Complexity: 1 Mod   PT General Charges $$ ACUTE PT VISIT: 1 Visit         Vickki Muff, PT, DPT   Acute Rehabilitation Department Office [phone] Secure Chat Communication Preferred  Ronnie Derby 10/12/2022, 12:59 PM

## 2022-10-12 NOTE — Progress Notes (Signed)
Nutrition Brief Note  Consult received for assessment of nutritional status. Full nutrition assessment performed on 8/7.   Patient was NPO yesterday for R hip hemiarthroplasty for femoral neck fracture. S/p SLP evaluation today. Recommend continue with regular diet, thin liquids and further workup for GERD.   Patient remains on a Regular diet with nutrition supplements in place. She has received both doses today. Will also order Magic Cup to further augment oral intake.   No documented meal completions to review today. Per discussion with RN, pt consumed most of her lunch and dinner today.   Will continue with current nutrition interventions. Plan to follow up as appropriate throughout admission. Please reach out via secure chat or re-consult if additional nutrition related concerns should arise in the meantime.   Drusilla Kanner, RDN, LDN Clinical Nutrition

## 2022-10-12 NOTE — Progress Notes (Signed)
Day shift RN will follow up with IV fluid change this morning.  Currently patient is saline locked.

## 2022-10-12 NOTE — Evaluation (Signed)
Clinical/Bedside Swallow Evaluation Patient Details  Name: Beverly Kim MRN: [id number] Date of Birth: [birth year]  Today's Date: 10/12/2022 Time: SLP Start Time (ACUTE ONLY): 0945 SLP Stop Time (ACUTE ONLY): 1006 SLP Time Calculation (min) (ACUTE ONLY): 21 min  Past Medical History:  Past Medical History:  Diagnosis Date   Acute blood loss anemia 09/04/2022   Cardiac pacemaker 05/2016   Sick sinus syndrome/complete heart block   Chronic heart failure with preserved ejection fraction (HFpEF) (HCC)    Colon polyps    Diverticulosis    Dyspnea    GERD (gastroesophageal reflux disease)    Guaiac positive stools 09/04/2022   Hearing loss of both ears    Hepatic cyst    Hiatal hernia    Hyperlipidemia    Hypertension    Hypothyroidism    Low back pain    Non-occlusive coronary artery disease 04/2001   Cardiac Cath 04/18/2001: 50 and 70% mid LAD after D1. = Nonischemic by Myoview.  Medical management.   Other left bundle branch block    PAF (paroxysmal atrial fibrillation) (HCC) 08/16/2016   observed on PPM interrogation, asymptomatic, chad2vasc score is 5.  Bisoprolol for rate control, Xarelto for anticoagulation.   Rectal bleeding 09/04/2022   URI (upper respiratory infection)    Past Surgical History:  Past Surgical History:  Procedure Laterality Date   BIOPSY  09/05/2022   Procedure: BIOPSY;  Surgeon: Meridee Score Netty Starring., MD;  Location: [facility] ENDOSCOPY;  Service: Gastroenterology;;   CARDIOVERSION N/A 01/16/2019   Procedure: CARDIOVERSION;  Surgeon: Wendall Stade, MD;  Location: [facility] ENDOSCOPY;  Service: Cardiovascular;  Laterality: N/A;   CHOLECYSTECTOMY N/A 12/15/2019   Procedure: LAPAROSCOPIC CHOLECYSTECTOMY;  Surgeon: Almond Lint, MD;  Location: [facility] OR;  Service: General;  Laterality: N/A;   COLONOSCOPY     COLONOSCOPY N/A 09/05/2022   Procedure: COLONOSCOPY;  Surgeon: Lemar Lofty., MD;  Location: [facility] ENDOSCOPY;  Service: Gastroenterology;  Laterality: N/A;    ERCP N/A 07/21/2019   Procedure: ENDOSCOPIC RETROGRADE CHOLANGIOPANCREATOGRAPHY (ERCP);  Surgeon: Rachael Fee, MD;  Location: [facility] ENDOSCOPY;  Service: Endoscopy;  Laterality: N/A;   HERNIA REPAIR     HOT HEMOSTASIS N/A 09/05/2022   Procedure: HOT HEMOSTASIS (ARGON PLASMA COAGULATION/BICAP);  Surgeon: Lemar Lofty., MD;  Location: [facility] ENDOSCOPY;  Service: Gastroenterology;  Laterality: N/A;   LEFT HEART CATH AND CORONARY ANGIOGRAPHY  04/18/2001   Dr. Riley Kill: EF 60 to 65%.  2+ MR.  Upper LM takeoff with 90 degree bend just inside the ostium.  50-70% mid LAD after D1.  D1 is 30%.    Distal LAD wraps the apex and is free of disease.  LCx is mostly large OM branch.  RCA provides PDA and PL branches and Free of disease. -->  Presumably evaluated by Myoview that was nonischemic, therefore no PCI performed.   NM MYOVIEW LTD  01/2012   Normal EF.  Normal study.  No ischemia or infarction.   PACEMAKER IMPLANT Left 05/15/2016   SJM Assirity MRI dual chamber PPM implanted by Dr Johney Frame for CHB   POLYPECTOMY  09/05/2022   Procedure: POLYPECTOMY;  Surgeon: Mansouraty, Netty Starring., MD;  Location: [facility] ENDOSCOPY;  Service: Gastroenterology;;   REMOVAL OF STONES  07/21/2019   Procedure: REMOVAL OF STONES;  Surgeon: Rachael Fee, MD;  Location: [facility] ENDOSCOPY;  Service: Endoscopy;;   SPHINCTEROTOMY  07/21/2019   Procedure: Dennison Mascot;  Surgeon: Rachael Fee, MD;  Location: [facility] ENDOSCOPY;  Service: Endoscopy;;   THYROIDECTOMY  TONSILLECTOMY AND ADENOIDECTOMY     TOTAL ABDOMINAL HYSTERECTOMY W/ BILATERAL SALPINGOOPHORECTOMY     TRANSTHORACIC ECHOCARDIOGRAM  06/27/2016   Moderate basal-septal LVH, EF 60 to 65%.  No R WMA.  GR 1 DD.  Mild aortic valve calcification.  No AS.  Trivial MR.--Indicated improved EF compared to prior study in 2015.   TRANSTHORACIC ECHOCARDIOGRAM  02/22/2012   12/'13 -a) EF 50 and 55%.  Normal function.-Moderate MR.  Mild LA dilation.; b) 11/2015Echo for shortness of  breath => reduced EF of 45 to 50%.  Mild LVH.  Inferior reversible HK.  GRII DD.  Moderate MR.   TRANSTHORACIC ECHOCARDIOGRAM  10/2019   EF 60 -65%.  No RWMA.  Severe concentric LVH.  Unable to assess diastolic function.  Mild biatrial dilation.  Moderate MR moderate TR, no AS   TRANSTHORACIC ECHOCARDIOGRAM  06/15/2021   EF 40 to 45%.  Paradoxical septal motion from pacemaker.  Mild concentric LVH.  Indeterminate diastolic pressure.  Severe biatrial enlargement would argue significant diastolic dysfunction.  Severe MR and severe TR. => EF down from 60 to 65%.   HPI:  Ms. Wix a 89 yo F who had an unwitnessed fall at home with right hip pain unable to bear weight.  Imaging confirms right femoral neck fracture. Now s/p surgical repair. head CT 8/6 with no acute findings. Most recent CXR 7/31 without acute findings.  Pt with hx of AF not on AC (recently stopped Eliquis), non-occlusive CAD, CHB, CKD, CHF and Saint Jude pacemaker.    Assessment / Plan / Recommendation  Clinical Impression  Pt presents with functional swallowing as assessed clinically. Pt tolerated all consistencies trialed, including serial straw sips of thin liquid without any clinical s/s of aspiration.  Pt did exhibit infrequent delayed cough/throat clear, which is baseline per pt/family. Pt reports coughing up mucus as well.  Pt has hx of GERD, but has had trouble finding the right medication.  Pantoprazole caused nausea and she has not trialed another medication yet.  Pt's symptoms are seemingly consistent with reflux and she is safe to continue current diet.  She may benefit from further GERD work up and/or trials of other medications to help manage symptoms.  Provided education to family regarding non-medication management of GERD symptoms.  Pt has no further ST needs at this time.  SLP will sign off.    Recommend continuing regular texture diet with thin liquids.   SLP Visit Diagnosis: Dysphagia, unspecified (R13.10)     Aspiration Risk  No limitations    Diet Recommendation Regular;Thin liquid    Liquid Administration via: Cup;Straw Medication Administration: Whole meds with liquid Supervision: Patient able to self feed Compensations: Slow rate;Small sips/bites Postural Changes: Seated upright at 90 degrees;Remain upright for at least 30 minutes after po intake    Other  Recommendations Oral Care Recommendations: Oral care BID    Recommendations for follow up therapy are one component of a multi-disciplinary discharge planning process, led by the attending physician.  Recommendations may be updated based on patient status, additional functional criteria and insurance authorization.  Follow up Recommendations No SLP follow up      Assistance Recommended at Discharge  N/A  Functional Status Assessment Patient has not had a recent decline in their functional status  Frequency and Duration  (N/A)          Prognosis Prognosis for improved oropharyngeal function:  (N/A)      Swallow Study   General Date of Onset: 10/12/22 HPI: Ms.  Beland a 89 yo F who had an unwitnessed fall at home with right hip pain unable to bear weight.  Imaging confirms right femoral neck fracture. Now s/p surgical repair. head CT 8/6 with no acute findings. Most recent CXR 7/31 without acute findings.  Pt with hx of AF not on AC (recently stopped Eliquis), non-occlusive CAD, CHB, CKD, CHF and Saint Jude pacemaker. Type of Study: Bedside Swallow Evaluation Previous Swallow Assessment: None Diet Prior to this Study: Regular;Thin liquids (Level 0) Temperature Spikes Noted: No Respiratory Status: Nasal cannula History of Recent Intubation: Yes (for procedure) Behavior/Cognition: Alert;Cooperative;Pleasant mood;Confused Oral Cavity Assessment: Within Functional Limits Oral Care Completed by SLP: No Oral Cavity - Dentition: Adequate natural dentition Self-Feeding Abilities: Able to feed self Patient Positioning: Upright in  bed Baseline Vocal Quality: Normal Volitional Cough: Strong Volitional Swallow: Able to elicit    Oral/Motor/Sensory Function Overall Oral Motor/Sensory Function: Within functional limits Facial ROM: Within Functional Limits Facial Symmetry: Within Functional Limits Lingual ROM: Within Functional Limits Lingual Symmetry: Within Functional Limits Lingual Strength:  (could not test but seemingly WFL) Velum: Within Functional Limits Mandible: Within Functional Limits   Ice Chips Ice chips: Not tested   Thin Liquid Thin Liquid: Within functional limits Presentation: Straw    Nectar Thick Nectar Thick Liquid: Not tested   Honey Thick Honey Thick Liquid: Not tested   Puree Puree: Within functional limits Presentation: Spoon;Self Fed   Solid     Solid: Within functional limits Presentation: Self Fed      Kerrie Pleasure, MA, CCC-SLP Acute Rehabilitation Services Office: [phone] 10/12/2022,10:25 AM

## 2022-10-12 NOTE — Progress Notes (Deleted)
Pharmacy note  89 yo female on apixaban PTA for history of afib. She is here s/p fall and R hip fracture s/p repair 8/8. Apixaban to restart today if hg is > 8.4 and no transfusion needed -hg= 10.4 -SCr= 1.49 w/ trend down (baseline 1.3-1.5)  Plan -Restart apixaban 5mg  po bid -Will need to watch SCr trend, with SCr 1.5 she will need a dose reduction to 2.5mg  po bid  Harland German, PharmD Clinical Pharmacist **Pharmacist phone directory can now be found on [email or web address] ([email or web address]).  Listed under [facility].

## 2022-10-12 NOTE — Evaluation (Signed)
Occupational Therapy Evaluation Patient Details Name: Beverly Kim MRN: [id number] DOB: [birth year] Today's Date: 10/12/2022   History of Present Illness The pt is a 89 yo female presenting 8/6 with R hip pain after a fall at home. Pt found to have R hip fx, now s/p unipolar hemiarthroplasty of R hip on 8/8. PMH includes: anemia, pacemaker, CKD III, HFpEF, hearing loss, HLD, HTN, hypothyroidism, low back pain, LBBB, and PAF.   Clinical Impression   Pt admitted for above dx, PTA family reports pt was Mod I for Mobility and ind in bADLs/iADls. Pt currently limited by impaired cognition and needs total A +2 to get to EOB, she demonstrates poor EOB sitting balance and is also limited by RLE pain. Pt HOH and not fully following commands, could be due to medications, educated pt daughter on posterior hip precautions and provided handout. Pt is resistive to BLE movement at this time. Pt would benefit from continued acute skilled OT services to address deficits and help  transition to next level of care. Patient has the potential to reach Mod I and demos the ability to tolerate 3 hours of therapy. Pt would benefit from an intensive rehab program to help maximize functional independence pending improved cognition.        If plan is discharge home, recommend the following: Two people to help with walking and/or transfers;A lot of help with bathing/dressing/bathroom;Assistance with cooking/housework;Assist for transportation;Help with stairs or ramp for entrance    Functional Status Assessment  Patient has had a recent decline in their functional status and demonstrates the ability to make significant improvements in function in a reasonable and predictable amount of time.  Equipment Recommendations  None recommended by OT (defer)    Recommendations for Other Services Rehab consult     Precautions / Restrictions Precautions Precautions: Posterior Hip Precaution Booklet Issued: Yes  (comment) Restrictions Weight Bearing Restrictions: Yes RLE Weight Bearing: Weight bearing as tolerated      Mobility Bed Mobility Overal bed mobility: Needs Assistance Bed Mobility: Supine to Sit, Sit to Supine     Supine to sit: Total assist, +2 for physical assistance, +2 for safety/equipment Sit to supine: +2 for physical assistance, +2 for safety/equipment, Total assist   General bed mobility comments: helicopter method with use of pads, total A for scooting anteriorly    Transfers                   General transfer comment: defer      Balance Overall balance assessment: Needs assistance Sitting-balance support: Single extremity supported, Feet supported Sitting balance-Leahy Scale: Poor Sitting balance - Comments: Pt needing a form of external support to maintain static sitting balance, she props on her L elbow and leans back. Needs Min to Mod A to help maintain balance Postural control: Posterior lean, Left lateral lean                                 ADL either performed or assessed with clinical judgement   ADL Overall ADL's : Needs assistance/impaired Eating/Feeding: Independent;Bed level   Grooming: Wash/dry face;Sitting;Moderate assistance Grooming Details (indicate cue type and reason): Pt min to mod A to maintain EOB sitting balance Upper Body Bathing: Set up;Bed level   Lower Body Bathing: Total assistance;Sitting/lateral leans   Upper Body Dressing : Bed level;Set up   Lower Body Dressing: Total assistance     Toilet Transfer Details (indicate cue  type and reason): NT   Toileting - Clothing Manipulation Details (indicate cue type and reason): NT       General ADL Comments: OOB mobility deferred due to impaired cognition. Attempted to perform dynamic reaching with patient but she was not accurately following commands, she would often think it was an effort to stand. Edcuated daughter on posterior hip precautions, handout  provided.     Vision         Perception         Praxis         Pertinent Vitals/Pain Pain Assessment Pain Assessment: Faces Faces Pain Scale: Hurts little more Pain Location: R hip with movement Pain Descriptors / Indicators: Aching, Discomfort, Grimacing, Guarding Pain Intervention(s): Limited activity within patient's tolerance, Premedicated before session, Repositioned, Monitored during session     Extremity/Trunk Assessment Upper Extremity Assessment Upper Extremity Assessment: Difficult to assess due to impaired cognition;Generalized weakness (BUEs not going beyond 100* at this time, hx of L shoulder injury as baseline. LUE worse that R)   Lower Extremity Assessment Lower Extremity Assessment: Defer to PT evaluation       Communication Communication Communication: Hearing impairment;Difficulty following commands/understanding Following commands: Follows one step commands inconsistently Cueing Techniques: Verbal cues;Tactile cues;Visual cues;Gestural cues   Cognition Arousal: Alert Behavior During Therapy: WFL for tasks assessed/performed Overall Cognitive Status: Difficult to assess                                 General Comments: Pt very HOH despite using hearing aids and presents with tangential speech. Per RN she is also hallucinating from medications, can follow very simple commands with multi modal cues     General Comments  VSS on 2L    Exercises     Shoulder Instructions      Home Living Family/patient expects to be discharged to:: Private residence Living Arrangements: Children Available Help at Discharge: Family;Available PRN/intermittently (daughter works from home) Type of Home: House Home Access: Stairs to enter Secretary/administrator of Steps: 1   Home Layout: One level     Bathroom Shower/Tub: Walk-in shower (6 in lip)   Bathroom Toilet: Standard Bathroom Accessibility: Yes   Home Equipment: Rollator (4  wheels);Rolling Walker (2 wheels);Shower seat;Grab bars - toilet;Grab bars - tub/shower;Toilet riser          Prior Functioning/Environment Prior Level of Function : Independent/Modified Independent             Mobility Comments: uses rollator, no other falls. uses lift chair ADLs Comments: independent with ADLs and medication management. "fiercely independent"        OT Problem List: Decreased strength;Impaired balance (sitting and/or standing);Pain;Decreased knowledge of precautions      OT Treatment/Interventions: Self-care/ADL training;Balance training;Patient/family education;Therapeutic exercise;Therapeutic activities    OT Goals(Current goals can be found in the care plan section) Acute Rehab OT Goals Patient Stated Goal: To get back to being independent OT Goal Formulation: With family Time For Goal Achievement: 10/26/22 Potential to Achieve Goals: Good  OT Frequency: Min 1X/week    Co-evaluation PT/OT/SLP Co-Evaluation/Treatment: Yes Reason for Co-Treatment: Necessary to address cognition/behavior during functional activity;For patient/therapist safety   OT goals addressed during session: ADL's and self-care      AM-PAC OT "6 Clicks" Daily Activity     Outcome Measure Help from another person eating meals?: None Help from another person taking care of personal grooming?: A Little Help from another person  toileting, which includes using toliet, bedpan, or urinal?: Total Help from another person bathing (including washing, rinsing, drying)?: Total Help from another person to put on and taking off regular upper body clothing?: A Little Help from another person to put on and taking off regular lower body clothing?: Total 6 Click Score: 13   End of Session Nurse Communication: Need for lift equipment  Activity Tolerance: Treatment limited secondary to medical complications (Comment);Other (comment) (limited by cognition) Patient left: in bed;with call bell/phone  within reach;with bed alarm set;with family/visitor present  OT Visit Diagnosis: Unsteadiness on feet (R26.81);Other abnormalities of gait and mobility (R26.89);Muscle weakness (generalized) (M62.81);History of falling (Z91.81);Pain Pain - Right/Left: Right Pain - part of body: Hip                Time: 1478-2956 OT Time Calculation (min): 54 min Charges:  OT General Charges $OT Visit: 1 Visit OT Evaluation $OT Eval Moderate Complexity: 1 Mod OT Treatments $Self Care/Home Management : 8-22 mins  10/12/2022  AB, OTR/L  Acute Rehabilitation Services  Office: [phone]   Tristan Schroeder 10/12/2022, 10:59 AM

## 2022-10-13 DIAGNOSIS — S72001A Fracture of unspecified part of neck of right femur, initial encounter for closed fracture: Secondary | ICD-10-CM | POA: Diagnosis not present

## 2022-10-13 DIAGNOSIS — N1831 Chronic kidney disease, stage 3a: Secondary | ICD-10-CM | POA: Diagnosis not present

## 2022-10-13 DIAGNOSIS — I5042 Chronic combined systolic (congestive) and diastolic (congestive) heart failure: Secondary | ICD-10-CM | POA: Diagnosis not present

## 2022-10-13 DIAGNOSIS — S72141A Displaced intertrochanteric fracture of right femur, initial encounter for closed fracture: Secondary | ICD-10-CM | POA: Diagnosis not present

## 2022-10-13 LAB — GLUCOSE, CAPILLARY
Glucose-Capillary: 109 mg/dL — ABNORMAL HIGH (ref 70–99)
Glucose-Capillary: 153 mg/dL — ABNORMAL HIGH (ref 70–99)
Glucose-Capillary: 127 mg/dL — ABNORMAL HIGH (ref 70–99)
Glucose-Capillary: 115 mg/dL — ABNORMAL HIGH (ref 70–99)

## 2022-10-13 MED ORDER — SODIUM CHLORIDE 0.9 % IV SOLN
INTRAVENOUS | Status: DC
  Administered 2022-10-13: 50 mL/h via INTRAVENOUS

## 2022-10-13 NOTE — Plan of Care (Signed)
  Problem: Education: Goal: Ability to describe self-care measures that may prevent or decrease complications (Diabetes Survival Skills Education) will improve Outcome: Progressing   Problem: Coping: Goal: Ability to adjust to condition or change in health will improve Outcome: Progressing   Problem: Fluid Volume: Goal: Ability to maintain a balanced intake and output will improve Outcome: Progressing   Problem: Metabolic: Goal: Ability to maintain appropriate glucose levels will improve Outcome: Progressing   Problem: Nutritional: Goal: Maintenance of adequate nutrition will improve Outcome: Progressing   Problem: Skin Integrity: Goal: Risk for impaired skin integrity will decrease Outcome: Progressing   Problem: Tissue Perfusion: Goal: Adequacy of tissue perfusion will improve Outcome: Progressing   Problem: Clinical Measurements: Goal: Ability to maintain clinical measurements within normal limits will improve Outcome: Progressing Goal: Will remain free from infection Outcome: Progressing Goal: Respiratory complications will improve Outcome: Progressing   Problem: Elimination: Goal: Will not experience complications related to bowel motility Outcome: Progressing Goal: Will not experience complications related to urinary retention Outcome: Progressing   Problem: Pain Managment: Goal: General experience of comfort will improve Outcome: Progressing   Problem: Coping: Goal: Level of anxiety will decrease Outcome: Progressing

## 2022-10-13 NOTE — Plan of Care (Signed)
  Problem: Nutritional: Goal: Maintenance of adequate nutrition will improve Outcome: Progressing   Problem: Skin Integrity: Goal: Risk for impaired skin integrity will decrease Outcome: Progressing   Problem: Clinical Measurements: Goal: Will remain free from infection Outcome: Progressing Goal: Respiratory complications will improve Outcome: Progressing

## 2022-10-13 NOTE — Progress Notes (Addendum)
PROGRESS NOTE    Beverly Kim  BJY:[id number] DOB: May 28, 1924 DOA: 10/09/2022 PCP: Pincus Sanes, MD   Brief Narrative:  Beverly Kim a 89 yo F with hx of AF not on AC (recently stopped Eliquis), non-occlusive CAD, CHB, CKD, CHF and Saint Jude pacemaker who had an unwitnessed fall at home with right hip pain unable to bear weight.  Imaging confirms right femoral neck fracture.  Hospitalist called for admission, orthopedics called in consult.  Assessment & Plan:   Principal Problem:   Fracture of femoral neck, right (HCC) Active Problems:   Intertrochanteric fx-closed, right, initial encounter (HCC)   Chronic combined systolic and diastolic CHF (congestive heart failure) (HCC)   CKD stage 3a, GFR 45-59 ml/min (HCC)   Hypothyroidism   Essential hypertension   GERD   Diabetes (HCC)   Complete heart block (HCC)   Paroxysmal atrial fibrillation (HCC)  Intertrochanteric fx-closed, right, initial encounter (HCC) -Fracture confirmed on imaging, Dr. Carola Frost performed hemiarthroplasty 10/11/22, patient tolerated well -Pain currently well controlled; unfortunately patient does have known issues with hallucinations while on narcotics - will attempt to wean aggressively as tolerated. -Avoid high dose/around the clock NSAIDs given CKD3a. -Cardiology initially following for preop clearance given age and comorbid conditions (now signed off), otherwise pain well-controlled, PT OT ongoing.     Hospital delirium versus sundowning, cannot rule out polypharmacy  -Patient has known sensitivity to codeine/narcotics with hallucinations.  Medications have been weaned and made as needed, use sparingly -Patient alert and oriented this morning but continues to complain of auditory hallucinations on occasion.  Continue appropriate sleep hygiene/attempt to maintain patient on appropriate sleep schedule.  CKD stage 3a, GFR 45-59 ml/min (HCC) -At baseline (creatinine 1.5) follow p.o. intake, urine output -Change  to NS until PO intake appropriate   Hyponatremia, iatrogenic -Patient previously placed on 1/2NS with downtrending Sodium -Now improving after normal saline and increase p.o. intake, DC IV fluids  Chronic combined systolic and diastolic CHF (congestive heart failure) (HCC) Patient appears stable w/o signs of decompensation.   Paroxysmal atrial fibrillation (HCC) Rate controlled. Not on anticoagulation at this time. Was on warfarin - transitioned to Eliquis but this too was discontinued due to "intolerance"  Complete heart block (HCC) Saint Jude pacemaker intact/functioning appropriately, cardiology evaluated for preop clearance as above   Diabetes (HCC), diet controlled A1C 6.7   Continue sliding scale insulin, hypoglycemic protocol, low-carb diet  GERD Continue PPI   Intertrigo, POA -Nystatin topical powder  Essential hypertension BP well controlled at time of admission on home medications.  Hypothyroidism Last TSH 09/03/22 5.0 -Continue levothyroxine  DVT prophylaxis: enoxaparin (LOVENOX) injection 30 mg Start: 10/12/22 1115 SCDs Start: 10/11/22 1856 Code Status:   Code Status: DNR Family Communication: None present  Status is: Inpatient  Dispo: The patient is from: Home              Anticipated d/c is to: To be determined, likely SNF              Anticipated d/c date is: 72+ hours              Patient currently not medically stable for discharge given need for surgical repair postoperative care, therapy and likely disposition to rehab facility  Consultants:  Orthopedic surgery  Procedures:  ORIF planned 10/11/2022  Antimicrobials:  Perioperatively  Subjective: No acute issues or events overnight, patient incredibly hard of hearing but notes hearing 'things that aren't in the room' and then changes topics to talk  about her family. ROS negative other than postop pain/stiffness.  Objective: Vitals:   10/12/22 1502 10/12/22 1900 10/13/22 0500 10/13/22 0724  BP:  (!) 113/40 129/70 114/62 (!) 121/55  Pulse: 67 66 68 64  Resp:  18 18 18   Temp: 98 F (36.7 C) 98 F (36.7 C) 98 F (36.7 C) 98.6 F (37 C)  TempSrc: Oral   Oral  SpO2: 100% 100% 100% 100%  Weight:      Height:        Intake/Output Summary (Last 24 hours) at 10/13/2022 0811 Last data filed at 10/12/2022 1835 Gross per 24 hour  Intake --  Output 500 ml  Net -500 ml   Filed Weights   10/11/22 1248  Weight: 70.3 kg    Examination:  General:  Pleasantly resting in bed, No acute distress. HEENT: Profoundly diminished hearing bilaterally Neck:  Without mass or deformity.  Trachea is midline. Lungs:  Clear to auscultate bilaterally without rhonchi, wheeze, or rales. Heart:  Regular rate and rhythm.  Without murmurs, rubs, or gallops. Abdomen:  Soft, nontender, nondistended.  Without guarding or rebound. Extremities: R hip bandage clean/dry/intact Skin:  Warm and dry, no erythema.  Data Reviewed: I have personally reviewed following labs and imaging studies   Scheduled Meds:  acetaminophen  1,000 mg Oral TID   bisoprolol  2.5 mg Oral Daily   Chlorhexidine Gluconate Cloth  6 each Topical Q0600   docusate sodium  100 mg Oral BID   enoxaparin (LOVENOX) injection  30 mg Subcutaneous Daily   feeding supplement  237 mL Oral BID BM   furosemide  40 mg Oral Daily   insulin aspart  0-15 Units Subcutaneous TID WC   levothyroxine  112 mcg Oral QAC breakfast   lidocaine  1 patch Transdermal Q24H   metolazone  2.5 mg Oral Once per day on Monday Wednesday Saturday   multivitamin with minerals  1 tablet Oral Daily   mupirocin ointment  1 Application Nasal BID   nystatin   Topical TID   pantoprazole  40 mg Oral Daily   polyethylene glycol  17 g Oral Daily   potassium chloride  10 mEq Oral Once per day on Monday Wednesday Saturday   senna  17.2 mg Oral Daily   traZODone  50 mg Oral QHS     LOS: 4 days   Time spent:  [name], DO [facility]  If  7PM-7AM, please contact night-coverage [email or web address]  10/13/2022, 8:11 AM

## 2022-10-13 NOTE — Progress Notes (Signed)
ORTHOPAEDIC PROGRESS NOTE  s/p Procedure(s): ARTHROPLASTY BIPOLAR HIP (HEMIARTHROPLASTY) on 8/8 with Dr. Carola Frost  SUBJECTIVE: Reports moderate pain. She states she is always in pain. Having more pain in her bilatearl knees. Daughter at bedside.   No chest pain. No SOB. No nausea/vomiting. No other complaints.  OBJECTIVE: PE: General: sitting up in hospital bed, NAD RLE: dressing CDI, leg lengths equal, she can flex and extend all toes, endorses distal sensation, warm well perfused foot  Vitals:   10/13/22 0500 10/13/22 0724  BP: 114/62 (!) 121/55  Pulse: 68 64  Resp: 18 18  Temp: 98 F (36.7 C) 98.6 F (37 C)  SpO2: 100% 100%     ASSESSMENT: Beverly Kim is a 89 y.o. female POD#2  PLAN: Weightbearing: WBAT RLE - posterior hip precautions Insicional and dressing care: Reinforce dressings as needed Orthopedic device(s): None Showering: Hold for now VTE prophylaxis: Lovenox Pain control: PRN pain medications, minimize narcotics as able Follow - up plan: 2 weeks in office with Dr. Carola Frost Dispo: TBD. Will need SNF. Family agreeable.  Contact information: After hours and holidays please check [email or web address] for group call information for [facility]   Alfonse Alpers, PA-C 10/13/2022

## 2022-10-13 NOTE — Progress Notes (Signed)
Pt has voided 125 ml of yellow urine today.   Bladder scan result was 169 ml.  Notified MD and NS @ 50 ml/h ordered.

## 2022-10-14 DIAGNOSIS — N1831 Chronic kidney disease, stage 3a: Secondary | ICD-10-CM | POA: Diagnosis not present

## 2022-10-14 DIAGNOSIS — I5042 Chronic combined systolic (congestive) and diastolic (congestive) heart failure: Secondary | ICD-10-CM | POA: Diagnosis not present

## 2022-10-14 DIAGNOSIS — S72001A Fracture of unspecified part of neck of right femur, initial encounter for closed fracture: Secondary | ICD-10-CM | POA: Diagnosis not present

## 2022-10-14 DIAGNOSIS — S72141A Displaced intertrochanteric fracture of right femur, initial encounter for closed fracture: Secondary | ICD-10-CM | POA: Diagnosis not present

## 2022-10-14 LAB — GLUCOSE, CAPILLARY
Glucose-Capillary: 100 mg/dL — ABNORMAL HIGH (ref 70–99)
Glucose-Capillary: 112 mg/dL — ABNORMAL HIGH (ref 70–99)
Glucose-Capillary: 141 mg/dL — ABNORMAL HIGH (ref 70–99)
Glucose-Capillary: 127 mg/dL — ABNORMAL HIGH (ref 70–99)

## 2022-10-14 NOTE — Progress Notes (Signed)
PROGRESS NOTE    Beverly Kim  NGE:[id number] DOB: Jan 05, 1925 DOA: 10/09/2022 PCP: Pincus Sanes, MD   Brief Narrative:  Ms. Wilkens a 89 yo F with hx of AF not on AC (recently stopped Eliquis), non-occlusive CAD, CHB, CKD, CHF and Saint Jude pacemaker who had an unwitnessed fall at home with right hip pain unable to bear weight.  Imaging confirms right femoral neck fracture.  Hospitalist called for admission, orthopedics called in consult.  Assessment & Plan:   Principal Problem:   Fracture of femoral neck, right (HCC) Active Problems:   Intertrochanteric fx-closed, right, initial encounter (HCC)   Chronic combined systolic and diastolic CHF (congestive heart failure) (HCC)   CKD stage 3a, GFR 45-59 ml/min (HCC)   Hypothyroidism   Essential hypertension   GERD   Diabetes (HCC)   Complete heart block (HCC)   Paroxysmal atrial fibrillation (HCC)  Intertrochanteric fx-closed, right, initial encounter (HCC) -Fracture confirmed on imaging, Dr. Carola Frost performed hemiarthroplasty 10/11/22, patient tolerated well -Pain currently well controlled; unfortunately patient does have known issues with hallucinations while on narcotics - will attempt to wean aggressively as tolerated. -Avoid high dose/around the clock NSAIDs given CKD3a. -Cardiology initially following for preop clearance given age and comorbid conditions (now signed off), otherwise pain well-controlled, PT OT ongoing.     Hospital delirium versus sundowning, cannot rule out polypharmacy  -Patient has known sensitivity to codeine/narcotics with hallucinations.  Medications have been weaned and made as needed, use sparingly -Patient alert and oriented this morning but continues to complain of auditory hallucinations on occasion.  -Continue appropriate sleep hygiene/attempt to maintain patient on appropriate sleep schedule.  CKD stage 3a, GFR 45-59 ml/min (HCC) -At baseline (creatinine 1.5) follow p.o. intake, urine output -Change  to NS until PO intake appropriate   Hyponatremia, iatrogenic -Patient previously placed on 1/2NS with downtrending sodium -Now improving after normal saline and increase p.o. intake - continue fluids until PO intake is appropriate  Chronic combined systolic and diastolic CHF (congestive heart failure) (HCC) Patient appears stable w/o signs of decompensation.   Paroxysmal atrial fibrillation (HCC) Rate controlled. Not on anticoagulation at this time. Was on warfarin - transitioned to Eliquis but this too was discontinued due to "intolerance"  Complete heart block (HCC) Saint Jude pacemaker intact/functioning appropriately, cardiology evaluated for preop clearance as above   Diabetes (HCC), diet controlled A1C 6.7   Continue sliding scale insulin, hypoglycemic protocol, low-carb diet  GERD Continue PPI   Intertrigo, POA -Nystatin topical powder  Essential hypertension BP well controlled at time of admission on home medications.  Hypothyroidism Last TSH 09/03/22 5.0 -Continue levothyroxine  DVT prophylaxis: enoxaparin (LOVENOX) injection 30 mg Start: 10/12/22 1115 SCDs Start: 10/11/22 1856 Code Status:   Code Status: DNR Family Communication: None present  Status is: Inpatient  Dispo: The patient is from: Home              Anticipated d/c is to: To be determined, likely SNF              Anticipated d/c date is: 72+ hours              Patient currently not medically stable for discharge given need for surgical repair postoperative care, therapy and likely disposition to rehab facility  Consultants:  Orthopedic surgery  Procedures:  ORIF planned 10/11/2022  Antimicrobials:  Perioperatively  Subjective: No acute issues or events overnight, patient incredibly hard of hearing but notes hearing 'things that aren't in the room' and  then changes topics to talk about her family. ROS negative other than postop pain/stiffness.  Objective: Vitals:   10/13/22 0922 10/13/22 1601  10/13/22 1923 10/14/22 0447  BP: 135/77 131/63 (!) 112/98 110/82  Pulse:  67 64 69  Resp:  18 18 18   Temp:  98 F (36.7 C) 98.2 F (36.8 C) 98 F (36.7 C)  TempSrc:    Oral  SpO2:  100% 99% 99%  Weight:      Height:        Intake/Output Summary (Last 24 hours) at 10/14/2022 0802 Last data filed at 10/14/2022 0302 Gross per 24 hour  Intake 725.64 ml  Output --  Net 725.64 ml   Filed Weights   10/11/22 1248  Weight: 70.3 kg    Examination:  General:  Pleasantly resting in bed, No acute distress. HEENT: Profoundly diminished hearing bilaterally Neck:  Without mass or deformity.  Trachea is midline. Lungs:  Clear to auscultate bilaterally without rhonchi, wheeze, or rales. Heart:  Regular rate and rhythm.  Without murmurs, rubs, or gallops. Abdomen:  Soft, nontender, nondistended.  Without guarding or rebound. Extremities: R hip bandage clean/dry/intact Skin:  Warm and dry, no erythema.  Data Reviewed: I have personally reviewed following labs and imaging studies   Scheduled Meds:  acetaminophen  1,000 mg Oral TID   bisoprolol  2.5 mg Oral Daily   Chlorhexidine Gluconate Cloth  6 each Topical Q0600   docusate sodium  100 mg Oral BID   enoxaparin (LOVENOX) injection  30 mg Subcutaneous Daily   feeding supplement  237 mL Oral BID BM   furosemide  40 mg Oral Daily   insulin aspart  0-15 Units Subcutaneous TID WC   levothyroxine  112 mcg Oral QAC breakfast   lidocaine  1 patch Transdermal Q24H   metolazone  2.5 mg Oral Once per day on Monday Wednesday Saturday   multivitamin with minerals  1 tablet Oral Daily   mupirocin ointment  1 Application Nasal BID   nystatin   Topical TID   pantoprazole  40 mg Oral Daily   polyethylene glycol  17 g Oral Daily   potassium chloride  10 mEq Oral Once per day on Monday Wednesday Saturday   senna  17.2 mg Oral Daily   traZODone  50 mg Oral QHS     LOS: 5 days   Time spent:  [name], DO [facility]  If 7PM-7AM, please contact night-coverage [email or web address]  10/14/2022, 8:02 AM

## 2022-10-14 NOTE — Progress Notes (Signed)
ORTHOPAEDIC PROGRESS NOTE  s/p Procedure(s): ARTHROPLASTY BIPOLAR HIP (HEMIARTHROPLASTY) on 8/8 with Dr. Carola Frost  SUBJECTIVE: Reports moderate pain. Having more pain in her bilateral knees. Daughter at bedside.   No chest pain. No SOB. No nausea/vomiting. No other complaints.  OBJECTIVE: PE: General: sitting up in hospital bed, NAD RLE: dressing CDI, leg lengths equal, she can flex and extend all toes, endorses distal sensation, warm well perfused foot  Vitals:   10/14/22 0702 10/14/22 0804  BP:  (!) 125/55  Pulse:  (!) 58  Resp:  15  Temp:  (!) 97.5 F (36.4 C)  SpO2: 100% 99%     ASSESSMENT: Beverly YSAGUIRRE is a 89 y.o. female POD#3  PLAN: Weightbearing: WBAT RLE - posterior hip precautions Insicional and dressing care: Reinforce dressings as needed Orthopedic device(s): None Showering: Hold for now VTE prophylaxis: Lovenox Pain control: PRN pain medications, minimize narcotics as able Follow - up plan: 2 weeks in office with Dr. Carola Frost Dispo: TBD. Will need SNF. Family agreeable.  Contact information: After hours and holidays please check [email or web address] for group call information for [facility]   Alfonse Alpers, PA-C 10/14/2022

## 2022-10-14 NOTE — Plan of Care (Signed)
Problem: Education: Goal: Ability to describe self-care measures that may prevent or decrease complications (Diabetes Survival Skills Education) will improve 10/14/2022 0229 by Olga Millers, LPN Outcome: Progressing 10/13/2022 2258 by Olga Millers, LPN Outcome: Progressing Goal: Individualized Educational Video(s) 10/14/2022 0229 by Olga Millers, LPN Outcome: Progressing 10/13/2022 2258 by Olga Millers, LPN Outcome: Progressing   Problem: Coping: Goal: Ability to adjust to condition or change in health will improve 10/14/2022 0229 by Olga Millers, LPN Outcome: Progressing 10/13/2022 2258 by Olga Millers, LPN Outcome: Progressing   Problem: Fluid Volume: Goal: Ability to maintain a balanced intake and output will improve 10/14/2022 0229 by Olga Millers, LPN Outcome: Progressing 10/13/2022 2258 by Olga Millers, LPN Outcome: Progressing   Problem: Health Behavior/Discharge Planning: Goal: Ability to identify and utilize available resources and services will improve 10/14/2022 0229 by Olga Millers, LPN Outcome: Progressing 10/13/2022 2258 by Olga Millers, LPN Outcome: Progressing Goal: Ability to manage health-related needs will improve 10/14/2022 0229 by Olga Millers, LPN Outcome: Progressing 10/13/2022 2258 by Olga Millers, LPN Outcome: Progressing   Problem: Metabolic: Goal: Ability to maintain appropriate glucose levels will improve 10/14/2022 0229 by Olga Millers, LPN Outcome: Progressing 10/13/2022 2258 by Olga Millers, LPN Outcome: Progressing   Problem: Nutritional: Goal: Maintenance of adequate nutrition will improve 10/14/2022 0229 by Olga Millers, LPN Outcome: Progressing 10/13/2022 2258 by Olga Millers, LPN Outcome: Progressing Goal: Progress toward achieving an optimal weight will improve 10/14/2022 0229 by Olga Millers, LPN Outcome: Progressing 10/13/2022 2258 by Olga Millers, LPN Outcome: Progressing   Problem: Skin Integrity: Goal: Risk for  impaired skin integrity will decrease 10/14/2022 0229 by Olga Millers, LPN Outcome: Progressing 10/13/2022 2258 by Olga Millers, LPN Outcome: Progressing   Problem: Tissue Perfusion: Goal: Adequacy of tissue perfusion will improve 10/14/2022 0229 by Olga Millers, LPN Outcome: Progressing 10/13/2022 2258 by Olga Millers, LPN Outcome: Progressing   Problem: Education: Goal: Knowledge of General Education information will improve Description: Including pain rating scale, medication(s)/side effects and non-pharmacologic comfort measures 10/14/2022 0229 by Olga Millers, LPN Outcome: Progressing 10/13/2022 2258 by Olga Millers, LPN Outcome: Progressing   Problem: Health Behavior/Discharge Planning: Goal: Ability to manage health-related needs will improve 10/14/2022 0229 by Olga Millers, LPN Outcome: Progressing 10/13/2022 2258 by Olga Millers, LPN Outcome: Progressing   Problem: Clinical Measurements: Goal: Ability to maintain clinical measurements within normal limits will improve 10/14/2022 0229 by Olga Millers, LPN Outcome: Progressing 10/13/2022 2258 by Olga Millers, LPN Outcome: Progressing Goal: Will remain free from infection 10/14/2022 0229 by Olga Millers, LPN Outcome: Progressing 10/13/2022 2258 by Olga Millers, LPN Outcome: Progressing Goal: Diagnostic test results will improve 10/14/2022 0229 by Olga Millers, LPN Outcome: Progressing 10/13/2022 2258 by Olga Millers, LPN Outcome: Progressing Goal: Respiratory complications will improve 10/14/2022 0229 by Olga Millers, LPN Outcome: Progressing 10/13/2022 2258 by Olga Millers, LPN Outcome: Progressing Goal: Cardiovascular complication will be avoided 10/14/2022 0229 by Olga Millers, LPN Outcome: Progressing 10/13/2022 2258 by Olga Millers, LPN Outcome: Progressing   Problem: Activity: Goal: Risk for activity intolerance will decrease 10/14/2022 0229 by Olga Millers, LPN Outcome: Progressing 10/13/2022  2258 by Olga Millers, LPN Outcome: Progressing   Problem: Nutrition: Goal: Adequate nutrition will be maintained 10/14/2022 0229 by Olga Millers, LPN Outcome: Progressing 10/13/2022 2258 by Olga Millers, LPN Outcome: Progressing  Problem: Coping: Goal: Level of anxiety will decrease 10/14/2022 0229 by Olga Millers, LPN Outcome: Progressing 10/13/2022 2258 by Olga Millers, LPN Outcome: Progressing   Problem: Elimination: Goal: Will not experience complications related to bowel motility 10/14/2022 0229 by Olga Millers, LPN Outcome: Progressing 10/13/2022 2258 by Olga Millers, LPN Outcome: Progressing Goal: Will not experience complications related to urinary retention 10/14/2022 0229 by Olga Millers, LPN Outcome: Progressing 10/13/2022 2258 by Olga Millers, LPN Outcome: Progressing   Problem: Pain Managment: Goal: General experience of comfort will improve 10/14/2022 0229 by Olga Millers, LPN Outcome: Progressing 10/13/2022 2258 by Olga Millers, LPN Outcome: Progressing   Problem: Safety: Goal: Ability to remain free from injury will improve 10/14/2022 0229 by Olga Millers, LPN Outcome: Progressing 10/13/2022 2258 by Olga Millers, LPN Outcome: Progressing   Problem: Skin Integrity: Goal: Risk for impaired skin integrity will decrease 10/14/2022 0229 by Olga Millers, LPN Outcome: Progressing 10/13/2022 2258 by Olga Millers, LPN Outcome: Progressing   Problem: Education: Goal: Knowledge of General Education information will improve Description: Including pain rating scale, medication(s)/side effects and non-pharmacologic comfort measures 10/14/2022 0229 by Olga Millers, LPN Outcome: Progressing 10/13/2022 2258 by Olga Millers, LPN Outcome: Progressing   Problem: Health Behavior/Discharge Planning: Goal: Ability to manage health-related needs will improve Outcome: Progressing   Problem: Clinical Measurements: Goal: Ability to maintain clinical measurements  within normal limits will improve 10/14/2022 0229 by Olga Millers, LPN Outcome: Progressing 10/13/2022 2258 by Olga Millers, LPN Outcome: Progressing Goal: Will remain free from infection 10/14/2022 0229 by Olga Millers, LPN Outcome: Progressing 10/13/2022 2258 by Olga Millers, LPN Outcome: Progressing Goal: Diagnostic test results will improve 10/14/2022 0229 by Olga Millers, LPN Outcome: Progressing 10/13/2022 2258 by Olga Millers, LPN Outcome: Progressing Goal: Respiratory complications will improve 10/14/2022 0229 by Olga Millers, LPN Outcome: Progressing 10/13/2022 2258 by Olga Millers, LPN Outcome: Progressing Goal: Cardiovascular complication will be avoided 10/14/2022 0229 by Olga Millers, LPN Outcome: Progressing 10/13/2022 2258 by Olga Millers, LPN Outcome: Progressing   Problem: Activity: Goal: Risk for activity intolerance will decrease 10/14/2022 0229 by Olga Millers, LPN Outcome: Progressing 10/13/2022 2258 by Olga Millers, LPN Outcome: Progressing   Problem: Nutrition: Goal: Adequate nutrition will be maintained 10/14/2022 0229 by Olga Millers, LPN Outcome: Progressing 10/13/2022 2258 by Olga Millers, LPN Outcome: Progressing   Problem: Coping: Goal: Level of anxiety will decrease 10/14/2022 0229 by Olga Millers, LPN Outcome: Progressing 10/13/2022 2258 by Olga Millers, LPN Outcome: Progressing   Problem: Elimination: Goal: Will not experience complications related to bowel motility 10/14/2022 0229 by Olga Millers, LPN Outcome: Progressing 10/13/2022 2258 by Olga Millers, LPN Outcome: Progressing Goal: Will not experience complications related to urinary retention 10/14/2022 0229 by Olga Millers, LPN Outcome: Progressing 10/13/2022 2258 by Olga Millers, LPN Outcome: Progressing   Problem: Pain Managment: Goal: General experience of comfort will improve 10/14/2022 0229 by Olga Millers, LPN Outcome: Progressing 10/13/2022 2258 by Olga Millers, LPN Outcome: Progressing   Problem: Safety: Goal: Ability to remain free from injury will improve 10/14/2022 0229 by Olga Millers, LPN Outcome: Progressing 10/13/2022 2258 by Olga Millers, LPN Outcome: Progressing   Problem: Skin Integrity: Goal: Risk for impaired skin integrity will decrease 10/14/2022 0229 by Olga Millers, LPN Outcome: Progressing 10/13/2022 2258 by Olga Millers, LPN Outcome: Progressing  Problem: Education: Goal: Verbalization of understanding the information provided (i.e., activity precautions, restrictions, etc) will improve Outcome: Progressing Goal: Individualized Educational Video(s) Outcome: Progressing   Problem: Activity: Goal: Ability to ambulate and perform ADLs will improve 10/14/2022 0229 by Olga Millers, LPN Outcome: Progressing 10/13/2022 2258 by Olga Millers, LPN Outcome: Progressing   Problem: Clinical Measurements: Goal: Postoperative complications will be avoided or minimized Outcome: Progressing

## 2022-10-14 NOTE — Plan of Care (Signed)
  Problem: Education: Goal: Ability to describe self-care measures that may prevent or decrease complications (Diabetes Survival Skills Education) will improve Outcome: Progressing Goal: Individualized Educational Video(s) Outcome: Progressing   

## 2022-10-15 DIAGNOSIS — N1831 Chronic kidney disease, stage 3a: Secondary | ICD-10-CM | POA: Diagnosis not present

## 2022-10-15 DIAGNOSIS — S72141A Displaced intertrochanteric fracture of right femur, initial encounter for closed fracture: Secondary | ICD-10-CM | POA: Diagnosis not present

## 2022-10-15 DIAGNOSIS — S72001A Fracture of unspecified part of neck of right femur, initial encounter for closed fracture: Secondary | ICD-10-CM | POA: Diagnosis not present

## 2022-10-15 DIAGNOSIS — I5042 Chronic combined systolic (congestive) and diastolic (congestive) heart failure: Secondary | ICD-10-CM | POA: Diagnosis not present

## 2022-10-15 LAB — GLUCOSE, CAPILLARY
Glucose-Capillary: 95 mg/dL (ref 70–99)
Glucose-Capillary: 118 mg/dL — ABNORMAL HIGH (ref 70–99)
Glucose-Capillary: 133 mg/dL — ABNORMAL HIGH (ref 70–99)
Glucose-Capillary: 119 mg/dL — ABNORMAL HIGH (ref 70–99)

## 2022-10-15 NOTE — TOC Initial Note (Addendum)
Transition of Care [facility]) - Initial/Assessment Note    Patient Details  Name: Beverly Kim MRN: [id number] Date of Birth: 1924-03-30  [facility]) CM/SW Contact:    Lorri Frederick, LCSW Phone Number: 10/15/2022, 1:38 PM  Clinical Narrative:   Pt oriented x1.  CSW spoke with pt daughter [name] and granddaughter Marylene Land.  Pt lives with [name].  Currently has [facility] services in place.  Recently started outpt palliative but daughter cannot recall which agency, does have it written down at home.  Daughter is agreeable to SNF, [facility] would be first choice.  Medicare choice document provided.  Permission given to send out referral in hub.  Referral sent out in hub, CSW reached out to [facility] to review. Auth request started with [facility] with facility choice pending.       1435: Bed offers provided to daughter.             Expected Discharge Plan: Skilled Nursing Facility Barriers to Discharge: Continued Medical Work up, SNF Pending bed offer   Patient Goals and CMS Choice   CMS Medicare.gov Compare Post Acute Care list provided to:: Patient Represenative (must comment) (daughter [name]) Choice offered to / list presented to : Adult Children [name])      Expected Discharge Plan and Services In-house Referral: Clinical Social Work   Post Acute Care Choice: Skilled Nursing Facility Living arrangements for the past 2 months: Skilled Nursing Facility                                      Prior Living Arrangements/Services Living arrangements for the past 2 months: Skilled Nursing Facility Lives with:: Adult Children (daughter [name]) Patient language and need for interpreter reviewed:: Yes        Need for Family Participation in Patient Care: Yes (Comment) Care giver support system in place?: Yes (comment) Current home services: Home PT, Other (comment) (HH through [place].  Outpt Palliative--daughter cannot recall which agency) Criminal Activity/Legal  Involvement Pertinent to Current Situation/Hospitalization: No - Comment as needed  Activities of Daily Living Home Assistive Devices/Equipment: Hearing aid, Walker (specify type) ADL Screening (condition at time of admission) Patient's cognitive ability adequate to safely complete daily activities?: Yes Is the patient deaf or have difficulty hearing?: Yes Does the patient have difficulty seeing, even when wearing glasses/contacts?: No Does the patient have difficulty concentrating, remembering, or making decisions?: Yes Patient able to express need for assistance with ADLs?: Yes Does the patient have difficulty dressing or bathing?: Yes Independently performs ADLs?: No  Permission Sought/Granted                  Emotional Assessment Appearance:: Appears stated age Attitude/Demeanor/Rapport: Unable to Assess Affect (typically observed): Unable to Assess Orientation: : Oriented to Self      Admission diagnosis:  History of heart block [Z86.79] Fracture of femoral neck, right (HCC) [S72.001A] Fall, initial encounter [W19.XXXA] Closed fracture of right hip, initial encounter (HCC) [S72.001A] Acute midline low back pain, unspecified whether sciatica present [M54.50] Patient Active Problem List   Diagnosis Date Noted   Intertrochanteric fx-closed, right, initial encounter (HCC) 10/09/2022   Fracture of femoral neck, right (HCC) 10/09/2022   Insomnia 09/12/2022   Acute cystitis 09/02/2022   Yeast infection of the skin 05/08/2022   Left upper arm pain 05/08/2022   Arm numbness 05/08/2022   Right wrist pain 05/08/2022   Right hand  pain 05/08/2022   Goals of care, counseling/discussion 06/20/2021   Blurry vision 01/04/2021   Diabetic neuropathy (HCC) 06/29/2020   CKD stage 3a, GFR 45-59 ml/min (HCC) 06/28/2020   Rib pain on right side    GI bleed 05/31/2020   Cardiac pacemaker 09/17/2019   Preop cardiovascular exam 09/17/2019   Vertigo 09/12/2019   Unsteady gait when  walking 09/12/2019   Dyshidrotic eczema 09/12/2019   Nausea 08/05/2019   Paroxysmal atrial fibrillation (HCC) 01/23/2019   Secondary hypercoagulable state (HCC) 01/23/2019   Fatigue 11/25/2018   Poor balance 10/22/2018   Bilateral hearing loss 10/21/2018   Carotid disease, bilateral (HCC) 04/08/2017   Chronic pain of both knees 04/08/2017   Non-rheumatic mitral regurgitation 07/12/2016   Complete heart block (HCC) 05/14/2016   Diabetes (HCC) 01/05/2016   Degenerative disc disease, cervical 08/23/2015   Degenerative disc disease, lumbar 08/23/2015   Left rotator cuff tear arthropathy 07/12/2015   Left shoulder pain 04/06/2015   Frozen shoulder 01/03/2015   Cough variant asthma 12/08/2013   Upper airway cough syndrome 10/27/2013   Dyspnea 02/03/2013   GERD 07/06/2009   Diaphragmatic hernia 07/06/2009   Diverticulosis of colon 07/06/2009   HEPATIC CYST 07/06/2009   COLONIC POLYPS, HX OF 07/06/2009   MIXED HEARING LOSS BILATERAL 12/13/2008   Essential hypertension 06/19/2008   MITRAL VALVE PROLAPSE 06/19/2008   LBBB (left bundle branch block) 06/19/2008   Hyperlipidemia 08/12/2007   Hypothyroidism 12/02/2006   Coronary artery disease involving native heart without angina pectoris 12/02/2006   Chronic combined systolic and diastolic CHF (congestive heart failure) (HCC) 12/02/2006   PCP:  Pincus Sanes, MD Pharmacy:   [id number] - [place] - [address] [place] [place] [place] [address] Phone: [phone] Fax: [phone]  [id number] - Closed - [place] - [address] [facility] [address] [place] [address] Phone: [phone] Fax: [phone]     Social Determinants of Health (SDOH) Social History: SDOH Screenings   Food Insecurity: No Food Insecurity (09/10/2022)  Housing: Low Risk  (09/03/2022)  Transportation Needs: No Transportation Needs (09/10/2022)  Utilities: Not At Risk (09/03/2022)   Alcohol Screen: Low Risk  (09/11/2019)  Depression (PHQ2-9): Low Risk  (09/11/2019)  Financial Resource Strain: Low Risk  (09/11/2019)  Physical Activity: Sufficiently Active (08/19/2018)  Social Connections: Moderately Integrated (09/11/2019)  Stress: No Stress Concern Present (09/11/2019)  Tobacco Use: Low Risk  (10/11/2022)   SDOH Interventions:     Readmission Risk Interventions    09/07/2022    3:18 PM  Readmission Risk Prevention Plan  Post Dischage Appt Complete  Medication Screening Complete  Transportation Screening Complete

## 2022-10-15 NOTE — Progress Notes (Signed)
PROGRESS NOTE    Beverly Kim  ZDG:[id number] DOB: [birth year] DOA: 10/09/2022 PCP: Pincus Sanes, MD   Brief Narrative:  Beverly Kim a 89 yo F with hx of AF not on AC (recently stopped Eliquis), non-occlusive CAD, CHB, CKD, CHF and Saint Jude pacemaker who had an unwitnessed fall at home with right hip pain unable to bear weight.  Imaging confirms right femoral neck fracture.  Hospitalist called for admission, orthopedics called in consult.  Assessment & Plan:   Principal Problem:   Fracture of femoral neck, right (HCC) Active Problems:   Intertrochanteric fx-closed, right, initial encounter (HCC)   Chronic combined systolic and diastolic CHF (congestive heart failure) (HCC)   CKD stage 3a, GFR 45-59 ml/min (HCC)   Hypothyroidism   Essential hypertension   GERD   Diabetes (HCC)   Complete heart block (HCC)   Paroxysmal atrial fibrillation (HCC)  Intertrochanteric fx-closed, right, initial encounter (HCC) -Fracture confirmed on imaging, Dr. Carola Frost performed hemiarthroplasty 10/11/22, patient tolerated well -Pain currently well controlled; unfortunately patient does have known issues with hallucinations while on narcotics - will attempt to wean aggressively as tolerated. -Avoid high dose/around the clock NSAIDs given CKD3a. -Cardiology initially following for preop clearance given age and comorbid conditions (now signed off), otherwise pain well-controlled, PT OT ongoing.     Hospital delirium versus sundowning, cannot rule out polypharmacy  -Patient has known sensitivity to codeine/narcotics with hallucinations.  Medications have been weaned and made as needed, use sparingly -Patient alert and oriented this morning but continues to complain of auditory hallucinations on occasion.  -Continue appropriate sleep hygiene/attempt to maintain patient on appropriate sleep schedule.  CKD stage 3a, GFR 45-59 ml/min (HCC) -At baseline (creatinine 1.5) follow p.o. intake, urine output -Change  to NS until PO intake appropriate   Hyponatremia, iatrogenic -Patient previously placed on 1/2NS with downtrending sodium -Now improving after normal saline and increase p.o. intake - continue fluids until PO intake is appropriate  Chronic combined systolic and diastolic CHF (congestive heart failure) (HCC) Patient appears stable w/o signs of decompensation.   Paroxysmal atrial fibrillation (HCC) Rate controlled. Not on anticoagulation at this time. Was on warfarin - transitioned to Eliquis but this too was discontinued due to "intolerance"  Complete heart block (HCC) Saint Jude pacemaker intact/functioning appropriately, cardiology evaluated for preop clearance as above   Diabetes (HCC), diet controlled A1C 6.7   Continue sliding scale insulin, hypoglycemic protocol, low-carb diet  GERD Continue PPI   Intertrigo, POA -Nystatin topical powder  Essential hypertension BP well controlled at time of admission on home medications.  Hypothyroidism Last TSH 09/03/22 5.0 -Continue levothyroxine  DVT prophylaxis: enoxaparin (LOVENOX) injection 30 mg Start: 10/12/22 1115 SCDs Start: 10/11/22 1856 Code Status:   Code Status: DNR Family Communication: None present  Status is: Inpatient  Dispo: The patient is from: Home              Anticipated d/c is to: To be determined, likely SNF              Anticipated d/c date is: 24-48h              Patient currently IS medically stable for discharge  Consultants:  Orthopedic surgery  Procedures:  ORIF 10/11/2022  Antimicrobials:  Perioperatively  Subjective: No acute issues or events overnight, patient incredibly hard of hearing -out of bed to chair today with PT OT requiring lift, patient reports worsening pain while in seated position but is tolerable.  Objective: Vitals:  10/14/22 1325 10/14/22 1928 10/15/22 0456 10/15/22 0711  BP: 118/71 (!) 117/48 (!) 110/45 (!) 126/57  Pulse: (!) 59 60 68 64  Resp: 18 18 18    Temp: (!)  97.5 F (36.4 C) 97.7 F (36.5 C) (!) 97.5 F (36.4 C) (!) 97.4 F (36.3 C)  TempSrc: Oral Oral Oral Oral  SpO2: 96% 95% 95% 99%  Weight:      Height:        Intake/Output Summary (Last 24 hours) at 10/15/2022 0751 Last data filed at 10/15/2022 0359 Gross per 24 hour  Intake 480 ml  Output 450 ml  Net 30 ml   Filed Weights   10/11/22 1248  Weight: 70.3 kg    Examination:  General: Mild distress sitting in bedside chair, tearful due to pain HEENT: Profoundly diminished hearing bilaterally Neck:  Without mass or deformity.  Trachea is midline. Lungs:  Clear to auscultate bilaterally without rhonchi, wheeze, or rales. Heart:  Regular rate and rhythm.  Without murmurs, rubs, or gallops. Abdomen:  Soft, nontender, nondistended.  Without guarding or rebound. Extremities: R hip bandage clean/dry/intact Skin:  Warm and dry, no erythema.  Data Reviewed: I have personally reviewed following labs and imaging studies   Scheduled Meds:  acetaminophen  1,000 mg Oral TID   bisoprolol  2.5 mg Oral Daily   Chlorhexidine Gluconate Cloth  6 each Topical Q0600   docusate sodium  100 mg Oral BID   enoxaparin (LOVENOX) injection  30 mg Subcutaneous Daily   feeding supplement  237 mL Oral BID BM   furosemide  40 mg Oral Daily   insulin aspart  0-15 Units Subcutaneous TID WC   levothyroxine  112 mcg Oral QAC breakfast   lidocaine  1 patch Transdermal Q24H   metolazone  2.5 mg Oral Once per day on Monday Wednesday Saturday   multivitamin with minerals  1 tablet Oral Daily   mupirocin ointment  1 Application Nasal BID   nystatin   Topical TID   pantoprazole  40 mg Oral Daily   polyethylene glycol  17 g Oral Daily   potassium chloride  10 mEq Oral Once per day on Monday Wednesday Saturday   senna  17.2 mg Oral Daily   traZODone  50 mg Oral QHS     LOS: 6 days   Time spent:  [name], DO [facility]  If 7PM-7AM, please contact  night-coverage [email or web address]  10/15/2022, 7:51 AM

## 2022-10-15 NOTE — Plan of Care (Signed)

## 2022-10-15 NOTE — [place] FL2 (Signed)
[place] MEDICAID FL2 LEVEL OF CARE FORM     IDENTIFICATION  Patient Name: Beverly Kim Birthdate: 09-Nov-1924 Sex: female Admission Date (Current Location): 10/09/2022  [facility] and [place] Number:  Producer, television/film/video and Address:  [facility], [address], [place], [place] [address]      Provider Number: [id number]  Attending Physician Name and Address:  [name], MD  Relative Name and Phone Number:  Capps,Laura Daughter [phone]    Current Level of Care: Hospital Recommended Level of Care: Skilled Nursing Facility Prior Approval Number:    Date Approved/Denied:   PASRR Number: [id number]  Discharge Plan: SNF    Current Diagnoses: Patient Active Problem List   Diagnosis Date Noted   Intertrochanteric fx-closed, right, initial encounter (HCC) 10/09/2022   Fracture of femoral neck, right (HCC) 10/09/2022   Insomnia 09/12/2022   Acute cystitis 09/02/2022   Yeast infection of the skin 05/08/2022   Left upper arm pain 05/08/2022   Arm numbness 05/08/2022   Right wrist pain 05/08/2022   Right hand pain 05/08/2022   Goals of care, counseling/discussion 06/20/2021   Blurry vision 01/04/2021   Diabetic neuropathy (HCC) 06/29/2020   CKD stage 3a, GFR 45-59 ml/min (HCC) 06/28/2020   Rib pain on right side    GI bleed 05/31/2020   Cardiac pacemaker 09/17/2019   Preop cardiovascular exam 09/17/2019   Vertigo 09/12/2019   Unsteady gait when walking 09/12/2019   Dyshidrotic eczema 09/12/2019   Nausea 08/05/2019   Paroxysmal atrial fibrillation (HCC) 01/23/2019   Secondary hypercoagulable state (HCC) 01/23/2019   Fatigue 11/25/2018   Poor balance 10/22/2018   Bilateral hearing loss 10/21/2018   Carotid disease, bilateral (HCC) 04/08/2017   Chronic pain of both knees 04/08/2017   Non-rheumatic mitral regurgitation 07/12/2016   Complete heart block (HCC) 05/14/2016   Diabetes (HCC) 01/05/2016   Degenerative disc disease,  cervical 08/23/2015   Degenerative disc disease, lumbar 08/23/2015   Left rotator cuff tear arthropathy 07/12/2015   Left shoulder pain 04/06/2015   Frozen shoulder 01/03/2015   Cough variant asthma 12/08/2013   Upper airway cough syndrome 10/27/2013   Dyspnea 02/03/2013   GERD 07/06/2009   Diaphragmatic hernia 07/06/2009   Diverticulosis of colon 07/06/2009   HEPATIC CYST 07/06/2009   COLONIC POLYPS, HX OF 07/06/2009   MIXED HEARING LOSS BILATERAL 12/13/2008   Essential hypertension 06/19/2008   MITRAL VALVE PROLAPSE 06/19/2008   LBBB (left bundle branch block) 06/19/2008   Hyperlipidemia 08/12/2007   Hypothyroidism 12/02/2006   Coronary artery disease involving native heart without angina pectoris 12/02/2006   Chronic combined systolic and diastolic CHF (congestive heart failure) (HCC) 12/02/2006    Orientation RESPIRATION BLADDER Height & Weight     Self  Normal Incontinent Weight: 155 lb (70.3 kg) Height:  5\' 2"  (157.5 cm)  BEHAVIORAL SYMPTOMS/MOOD NEUROLOGICAL BOWEL NUTRITION STATUS      Continent Diet (see discharge summary)  AMBULATORY STATUS COMMUNICATION OF NEEDS Skin   Total Care Verbally Surgical wounds                       Personal Care Assistance Level of Assistance  Bathing, Feeding, Dressing, Total care Bathing Assistance: Maximum assistance Feeding assistance: Independent Dressing Assistance: Maximum assistance Total Care Assistance: Maximum assistance   Functional Limitations Info  Sight, Hearing, Speech Sight Info: Adequate Hearing Info: Impaired Speech Info: Adequate    SPECIAL CARE FACTORS FREQUENCY  PT (By licensed PT), OT (By  licensed OT)     PT Frequency: 5x week OT Frequency: 5x week            Contractures Contractures Info: Not present    Additional Factors Info  Code Status, Allergies, Insulin Sliding Scale Code Status Info: DNR Allergies Info: Adhesive (Tape), Ace Inhibitors, Atorvastatin, Clindamycin, Codeine, Latex,  Shellfish Allergy, Simvastatin, Penicillins   Insulin Sliding Scale Info: Novolog: see discharge summary       Current Medications (10/15/2022):  This is the current hospital active medication list Current Facility-Administered Medications  Medication Dose Route Frequency Provider Last Rate Last Admin   acetaminophen (TYLENOL) tablet 1,000 mg  1,000 mg Oral TID Montez Morita, PA-C   1,000 mg at 10/15/22 1018   albuterol (PROVENTIL) (2.5 MG/3ML) 0.083% nebulizer solution 2.5 mg  2.5 mg Inhalation Q6H PRN Montez Morita, PA-C   2.5 mg at 10/09/22 1238   bisoprolol (ZEBETA) tablet 2.5 mg  2.5 mg Oral Daily Montez Morita, PA-C   2.5 mg at 10/15/22 1019   Chlorhexidine Gluconate Cloth 2 % PADS 6 each  6 each Topical Q0600 Myrene Galas, MD   6 each at 10/15/22 0653   docusate sodium (COLACE) capsule 100 mg  100 mg Oral BID Montez Morita, PA-C   100 mg at 10/15/22 1017   enoxaparin (LOVENOX) injection 30 mg  30 mg Subcutaneous Daily [name], MD   30 mg at 10/15/22 1044   feeding supplement (ENSURE ENLIVE / ENSURE PLUS) liquid 237 mL  237 mL Oral BID BM Montez Morita, PA-C   237 mL at 10/15/22 1041   furosemide (LASIX) tablet 40 mg  40 mg Oral Daily Montez Morita, PA-C   40 mg at 10/15/22 1018   insulin aspart (novoLOG) injection 0-15 Units  0-15 Units Subcutaneous TID WC Montez Morita, PA-C   2 Units at 10/14/22 1741   levothyroxine (SYNTHROID) tablet 112 mcg  112 mcg Oral QAC breakfast Montez Morita, PA-C   112 mcg at 10/15/22 0830   lidocaine (LIDODERM) 5 % 1 patch  1 patch Transdermal Q24H Montez Morita, PA-C   1 patch at 10/15/22 0830   menthol-cetylpyridinium (CEPACOL) lozenge 3 mg  1 lozenge Oral PRN Montez Morita, PA-C       Or   phenol (CHLORASEPTIC) mouth spray 1 spray  1 spray Mouth/Throat PRN Montez Morita, PA-C       metoCLOPramide (REGLAN) tablet 5-10 mg  5-10 mg Oral Q8H PRN Montez Morita, PA-C       Or   metoCLOPramide (REGLAN) injection 5-10 mg  5-10 mg Intravenous Q8H PRN Montez Morita, PA-C        metolazone (ZAROXOLYN) tablet 2.5 mg  2.5 mg Oral Once per day on Monday Wednesday Saturday Montez Morita, PA-C   2.5 mg at 10/15/22 1019   multivitamin with minerals tablet 1 tablet  1 tablet Oral Daily Montez Morita, PA-C   1 tablet at 10/15/22 1018   mupirocin ointment (BACTROBAN) 2 % 1 Application  1 Application Nasal BID Myrene Galas, MD   1 Application at 10/15/22 1041   nystatin (MYCOSTATIN/NYSTOP) topical powder   Topical TID Montez Morita, PA-C   Given at 10/15/22 1041   ondansetron (ZOFRAN) tablet 4 mg  4 mg Oral Q6H PRN Montez Morita, PA-C       Or   ondansetron [facility]) injection 4 mg  4 mg Intravenous Q6H PRN Montez Morita, PA-C       oxyCODONE (Oxy IR/ROXICODONE) immediate release tablet 5 mg  5  mg Oral Q6H PRN [name], MD   5 mg at 10/14/22 1550   pantoprazole (PROTONIX) EC tablet 40 mg  40 mg Oral Daily Montez Morita, PA-C   40 mg at 10/15/22 1019   polyethylene glycol (MIRALAX / GLYCOLAX) packet 17 g  17 g Oral Daily Montez Morita, PA-C   17 g at 10/15/22 1019   potassium chloride (KLOR-CON M) CR tablet 10 mEq  10 mEq Oral Once per day on Monday Wednesday Saturday Montez Morita, PA-C   10 mEq at 10/15/22 1019   senna (SENOKOT) tablet 17.2 mg  17.2 mg Oral Daily Montez Morita, PA-C   17.2 mg at 10/15/22 1018   traMADol (ULTRAM) tablet 50 mg  50 mg Oral Q6H PRN [name], MD   50 mg at 10/15/22 0734   traZODone (DESYREL) tablet 50 mg  50 mg Oral QHS Montez Morita, PA-C   50 mg at 10/14/22 2106     Discharge Medications: Please see discharge summary for a list of discharge medications.  Relevant Imaging Results:  Relevant Lab Results:   Additional Information SSN: [id number]  Lorri Frederick, LCSW

## 2022-10-15 NOTE — Progress Notes (Signed)
Physical Therapy Treatment Patient Details Name: YOUSRA Kim MRN: [id number] DOB: 1924/09/21 Today's Date: 10/15/2022   History of Present Illness The pt is a 89 yo female presenting 8/6 with R hip pain after a fall at home. Pt found to have R hip fx, now s/p unipolar hemiarthroplasty of R hip on 8/8. PMH includes: anemia, pacemaker, CKD III, HFpEF, hearing loss, HLD, HTN, hypothyroidism, low back pain, LBBB, and PAF.    PT Comments  The pt was agreeable to session, reports no mobility out of bed with nursing staff over the weekend. The pt was able to tolerate rolling in bed for placement of lift pad, and lifted to recliner where she completed LE exercises and gentle ROM exercises to reduce stiffness. The pt continues to report significant pain with movement of RLE, unable to progress to standing this session but will benefit from standing attempts in future sessions. Continue to recommend inpatient rehab <3 hours/day after d/c to maximize functional recovery and reduce caregiver burden prior to return home.     If plan is discharge home, recommend the following: Two people to help with walking and/or transfers;Two people to help with bathing/dressing/bathroom;Direct supervision/assist for medications management;Direct supervision/assist for financial management;Assist for transportation;Help with stairs or ramp for entrance;Supervision due to cognitive status   Can travel by private vehicle     No  Equipment Recommendations  Other (comment) (defer to post acute, likley WC)    Recommendations for Other Services       Precautions / Restrictions Precautions Precautions: Posterior Hip Precaution Booklet Issued: Yes (comment) Precaution Comments: daughter present and educated, pt unable to retain education Knee Immobilizer - Right: Other (comment) (in room, not on patient) Restrictions Weight Bearing Restrictions: Yes RLE Weight Bearing: Weight bearing as tolerated Other  Position/Activity Restrictions: posterior hip precautions     Mobility  Bed Mobility Overal bed mobility: Needs Assistance Bed Mobility: Rolling Rolling: Max assist, +2 for physical assistance, +2 for safety/equipment         General bed mobility comments: rolling fror peri care and maxi sky pad placement    Transfers Overall transfer level: Needs assistance Equipment used: Ambulation equipment used Transfers: Bed to chair/wheelchair/BSC             General transfer comment: maxisky to chair, pt with pain in RLE with movement. Transfer via Scientist, water quality  Ambulation/Gait               General Gait Details: deferred due to pain this session     Balance Overall balance assessment: Needs assistance Sitting-balance support: Feet supported Sitting balance-Leahy Scale: Poor Sitting balance - Comments: in recliner able to sit supervision for functional tasks, unsupported sitting not assessed this session                                    Cognition Arousal: Alert Behavior During Therapy: WFL for tasks assessed/performed Overall Cognitive Status: Difficult to assess                                 General Comments: Pt very HOH despite using hearing aids and presents with tangential speech. following commands especially when speaker is on the Pt's R side        Exercises General Exercises - Lower Extremity Ankle Circles/Pumps: PROM, 10 reps, Both, Supine Heel Slides: PROM, Both,  10 reps (partial ROM bilaterally) Hip ABduction/ADduction: PROM, Both, 10 reps, Supine    General Comments General comments (skin integrity, edema, etc.): VSS on RA      Pertinent Vitals/Pain Pain Assessment Pain Assessment: Faces Faces Pain Scale: Hurts whole lot Pain Location: R hip with movement Pain Descriptors / Indicators: Aching, Discomfort, Grimacing, Guarding, Crying Pain Intervention(s): Limited activity within patient's tolerance,  Monitored during session, Repositioned     PT Goals (current goals can now be found in the care plan section) Acute Rehab PT Goals Patient Stated Goal: none stated by pt, to get back to independence per daughter PT Goal Formulation: With patient/family Time For Goal Achievement: 10/26/22 Potential to Achieve Goals: Good Progress towards PT goals: Progressing toward goals    Frequency    Min 1X/week       Co-evaluation PT/OT/SLP Co-Evaluation/Treatment: Yes Reason for Co-Treatment: Necessary to address cognition/behavior during functional activity;For patient/therapist safety;To address functional/ADL transfers PT goals addressed during session: Mobility/safety with mobility;Balance;Proper use of DME;Strengthening/ROM OT goals addressed during session: ADL's and self-care      AM-PAC PT "6 Clicks" Mobility   Outcome Measure  Help needed turning from your back to your side while in a flat bed without using bedrails?: A Lot Help needed moving from lying on your back to sitting on the side of a flat bed without using bedrails?: Total Help needed moving to and from a bed to a chair (including a wheelchair)?: Total Help needed standing up from a chair using your arms (e.g., wheelchair or bedside chair)?: Total Help needed to walk in hospital room?: Total Help needed climbing 3-5 steps with a railing? : Total 6 Click Score: 7    End of Session Equipment Utilized During Treatment:  (lift) Activity Tolerance: Patient limited by pain Patient left: in chair;with call bell/phone within reach;with chair alarm set;with family/visitor present Nurse Communication: Mobility status;Need for lift equipment PT Visit Diagnosis: Other abnormalities of gait and mobility (R26.89);Unsteadiness on feet (R26.81);Muscle weakness (generalized) (M62.81);Pain Pain - Right/Left: Right Pain - part of body: Hip;Leg     Time: 4540-9811 PT Time Calculation (min) (ACUTE ONLY): 48 min  Charges:     $Therapeutic Exercise: 8-22 mins PT General Charges $$ ACUTE PT VISIT: 1 Visit                     Beverly Kim, PT, DPT   Acute Rehabilitation Department Office [phone] Secure Chat Communication Preferred   Ronnie Derby 10/15/2022, 12:31 PM

## 2022-10-15 NOTE — Plan of Care (Signed)
Problem: Education: Goal: Ability to describe self-care measures that may prevent or decrease complications (Diabetes Survival Skills Education) will improve Outcome: Progressing Goal: Individualized Educational Video(s) Outcome: Progressing   Problem: Coping: Goal: Ability to adjust to condition or change in health will improve Outcome: Progressing   Problem: Fluid Volume: Goal: Ability to maintain a balanced intake and output will improve Outcome: Progressing   Problem: Health Behavior/Discharge Planning: Goal: Ability to identify and utilize available resources and services will improve Outcome: Progressing Goal: Ability to manage health-related needs will improve Outcome: Progressing   Problem: Metabolic: Goal: Ability to maintain appropriate glucose levels will improve Outcome: Progressing   Problem: Nutritional: Goal: Maintenance of adequate nutrition will improve Outcome: Progressing Goal: Progress toward achieving an optimal weight will improve Outcome: Progressing   Problem: Skin Integrity: Goal: Risk for impaired skin integrity will decrease Outcome: Progressing   Problem: Tissue Perfusion: Goal: Adequacy of tissue perfusion will improve Outcome: Progressing   Problem: Education: Goal: Knowledge of General Education information will improve Description: Including pain rating scale, medication(s)/side effects and non-pharmacologic comfort measures Outcome: Progressing   Problem: Health Behavior/Discharge Planning: Goal: Ability to manage health-related needs will improve Outcome: Progressing   Problem: Clinical Measurements: Goal: Ability to maintain clinical measurements within normal limits will improve Outcome: Progressing Goal: Will remain free from infection Outcome: Progressing Goal: Diagnostic test results will improve Outcome: Progressing Goal: Respiratory complications will improve Outcome: Progressing Goal: Cardiovascular complication will  be avoided Outcome: Progressing   Problem: Activity: Goal: Risk for activity intolerance will decrease Outcome: Progressing   Problem: Nutrition: Goal: Adequate nutrition will be maintained Outcome: Progressing   Problem: Coping: Goal: Level of anxiety will decrease Outcome: Progressing   Problem: Elimination: Goal: Will not experience complications related to bowel motility Outcome: Progressing Goal: Will not experience complications related to urinary retention Outcome: Progressing   Problem: Pain Managment: Goal: General experience of comfort will improve Outcome: Progressing   Problem: Safety: Goal: Ability to remain free from injury will improve Outcome: Progressing   Problem: Skin Integrity: Goal: Risk for impaired skin integrity will decrease Outcome: Progressing   Problem: Education: Goal: Knowledge of General Education information will improve Description: Including pain rating scale, medication(s)/side effects and non-pharmacologic comfort measures Outcome: Progressing   Problem: Health Behavior/Discharge Planning: Goal: Ability to manage health-related needs will improve Outcome: Progressing   Problem: Clinical Measurements: Goal: Ability to maintain clinical measurements within normal limits will improve Outcome: Progressing Goal: Will remain free from infection Outcome: Progressing Goal: Diagnostic test results will improve Outcome: Progressing Goal: Respiratory complications will improve Outcome: Progressing Goal: Cardiovascular complication will be avoided Outcome: Progressing   Problem: Activity: Goal: Risk for activity intolerance will decrease Outcome: Progressing   Problem: Nutrition: Goal: Adequate nutrition will be maintained Outcome: Progressing   Problem: Coping: Goal: Level of anxiety will decrease Outcome: Progressing   Problem: Elimination: Goal: Will not experience complications related to bowel motility Outcome:  Progressing Goal: Will not experience complications related to urinary retention Outcome: Progressing   Problem: Pain Managment: Goal: General experience of comfort will improve Outcome: Progressing   Problem: Safety: Goal: Ability to remain free from injury will improve Outcome: Progressing   Problem: Skin Integrity: Goal: Risk for impaired skin integrity will decrease Outcome: Progressing   Problem: Education: Goal: Verbalization of understanding the information provided (i.e., activity precautions, restrictions, etc) will improve Outcome: Progressing Goal: Individualized Educational Video(s) Outcome: Progressing   Problem: Activity: Goal: Ability to ambulate and perform ADLs will improve Outcome: Progressing     Problem: Clinical Measurements: Goal: Postoperative complications will be avoided or minimized Outcome: Progressing   

## 2022-10-15 NOTE — Progress Notes (Signed)
Occupational Therapy Treatment Patient Details Name: Beverly Kim MRN: [id number] DOB: [birth year] Today's Date: 10/15/2022   History of present illness The pt is a 89 yo female presenting 8/6 with R hip pain after a fall at home. Pt found to have R hip fx, now s/p unipolar hemiarthroplasty of R hip on 8/8. PMH includes: anemia, pacemaker, CKD III, HFpEF, hearing loss, HLD, HTN, hypothyroidism, low back pain, LBBB, and PAF.   OT comments  Pt progressing towards OT goals this session. Therapy team reinforced posterior precautions throughout session as Pt with no retention from previous session. Daughter with good recall. Pt able to progress OOB via Maxiski today from bed to recliner with total A+2 rolling - including peri care at bed level with max A. Pt engaged in supported seated ADL in recliner, including set up for grooming tasks and sefl-feeding. Pt will benefit from continued OT in the acute setting and afterwards at post-acute OT <3 hours daily.   Of note: communication improved with hearing aids this session, but still a factor during session.       If plan is discharge home, recommend the following:  Two people to help with walking and/or transfers;A lot of help with bathing/dressing/bathroom;Assistance with cooking/housework;Assist for transportation;Help with stairs or ramp for entrance   Equipment Recommendations  None recommended by OT (defer)    Recommendations for Other Services Rehab consult    Precautions / Restrictions Precautions Precautions: Posterior Hip Precaution Booklet Issued: Yes (comment) Precaution Comments: daughter present and educated, pt unable to retain education Knee Immobilizer - Right: Other (comment) (in room, not on patient) Restrictions Weight Bearing Restrictions: Yes RLE Weight Bearing: Weight bearing as tolerated Other Position/Activity Restrictions: posterior hip precautions       Mobility Bed Mobility Overal bed mobility: Needs  Assistance Bed Mobility: Rolling Rolling: Max assist, +2 for physical assistance, +2 for safety/equipment         General bed mobility comments: rolling fror peri care and maxi sky pad placement    Transfers Overall transfer level: Needs assistance Equipment used: Ambulation equipment used Transfers: Bed to chair/wheelchair/BSC               Transfer via Lift Equipment: Maxisky   Balance Overall balance assessment: Needs assistance Sitting-balance support: Feet supported Sitting balance-Leahy Scale: Poor Sitting balance - Comments: in recliner able to sit supervision for functional tasks, unsupported sitting not assessed this session                                   ADL either performed or assessed with clinical judgement   ADL Overall ADL's : Needs assistance/impaired Eating/Feeding: Bed level;Set up Eating/Feeding Details (indicate cue type and reason): eating breakfast at end of session Grooming: Wash/dry face;Sitting;Set up;Wash/dry hands Grooming Details (indicate cue type and reason): in recliner with back support Upper Body Bathing: Bed level;Moderate assistance Upper Body Bathing Details (indicate cue type and reason): for back Lower Body Bathing: Total assistance;Sitting/lateral leans           Toilet Transfer: Total assistance (via maxi sky)   Toileting- Clothing Manipulation and Hygiene: Maximal assistance;Bed level;+2 for safety/equipment;+2 for physical assistance         General ADL Comments: decreased access to LB, need for lift equipment, support needed in sitting    Extremity/Trunk Assessment Upper Extremity Assessment Upper Extremity Assessment: Generalized weakness   Lower Extremity Assessment Lower Extremity Assessment: Defer to  PT evaluation        Vision       Perception     Praxis      Cognition Arousal: Alert Behavior During Therapy: WFL for tasks assessed/performed Overall Cognitive Status: Difficult to  assess                                 General Comments: Pt very HOH despite using hearing aids and presents with tangential speech. following commands especially when speaker is on the Pt's R side        Exercises      Shoulder Instructions       General Comments on RA throughout session    Pertinent Vitals/ Pain       Pain Assessment Pain Assessment: Faces Faces Pain Scale: Hurts even more Pain Location: R hip with movement Pain Descriptors / Indicators: Aching, Discomfort, Grimacing, Guarding, Crying Pain Intervention(s): Limited activity within patient's tolerance, Monitored during session, Premedicated before session, Repositioned (basic ROM against stiffness)  Home Living                                          Prior Functioning/Environment              Frequency  Min 1X/week        Progress Toward Goals  OT Goals(current goals can now be found in the care plan section)  Progress towards OT goals: Progressing toward goals  Acute Rehab OT Goals Patient Stated Goal: get back to independent OT Goal Formulation: With family Time For Goal Achievement: 10/26/22 Potential to Achieve Goals: Good  Plan      Co-evaluation    PT/OT/SLP Co-Evaluation/Treatment: Yes Reason for Co-Treatment: Necessary to address cognition/behavior during functional activity;For patient/therapist safety;To address functional/ADL transfers PT goals addressed during session: Mobility/safety with mobility;Balance;Proper use of DME;Strengthening/ROM OT goals addressed during session: ADL's and self-care      AM-PAC OT "6 Clicks" Daily Activity     Outcome Measure   Help from another person eating meals?: A Little Help from another person taking care of personal grooming?: A Little Help from another person toileting, which includes using toliet, bedpan, or urinal?: Total Help from another person bathing (including washing, rinsing, drying)?:  Total Help from another person to put on and taking off regular upper body clothing?: A Lot Help from another person to put on and taking off regular lower body clothing?: Total 6 Click Score: 11    End of Session Equipment Utilized During Treatment: Other (comment) (Maxi Sky)  OT Visit Diagnosis: Unsteadiness on feet (R26.81);Other abnormalities of gait and mobility (R26.89);Muscle weakness (generalized) (M62.81);History of falling (Z91.81);Pain Pain - Right/Left: Right Pain - part of body: Hip   Activity Tolerance Patient tolerated treatment well   Patient Left with call bell/phone within reach;with family/visitor present;in chair   Nurse Communication Need for lift equipment (get back to bed in 1 hour and OOB for dinner)        Time: 8657-8469 OT Time Calculation (min): 49 min  Charges: OT General Charges $OT Visit: 1 Visit OT Treatments $Self Care/Home Management : 8-22 mins  Nyoka Cowden OTR/L Acute Rehabilitation Services Office: [phone]   [facility] 10/15/2022, 10:37 AM

## 2022-10-16 DIAGNOSIS — I959 Hypotension, unspecified: Secondary | ICD-10-CM | POA: Diagnosis not present

## 2022-10-16 DIAGNOSIS — Z950 Presence of cardiac pacemaker: Secondary | ICD-10-CM | POA: Diagnosis not present

## 2022-10-16 DIAGNOSIS — I10 Essential (primary) hypertension: Secondary | ICD-10-CM | POA: Diagnosis not present

## 2022-10-16 DIAGNOSIS — L03119 Cellulitis of unspecified part of limb: Secondary | ICD-10-CM | POA: Diagnosis not present

## 2022-10-16 DIAGNOSIS — I129 Hypertensive chronic kidney disease with stage 1 through stage 4 chronic kidney disease, or unspecified chronic kidney disease: Secondary | ICD-10-CM | POA: Diagnosis not present

## 2022-10-16 DIAGNOSIS — S72001A Fracture of unspecified part of neck of right femur, initial encounter for closed fracture: Secondary | ICD-10-CM | POA: Diagnosis not present

## 2022-10-16 DIAGNOSIS — I5023 Acute on chronic systolic (congestive) heart failure: Secondary | ICD-10-CM | POA: Diagnosis not present

## 2022-10-16 DIAGNOSIS — K219 Gastro-esophageal reflux disease without esophagitis: Secondary | ICD-10-CM | POA: Diagnosis not present

## 2022-10-16 DIAGNOSIS — N179 Acute kidney failure, unspecified: Secondary | ICD-10-CM | POA: Diagnosis not present

## 2022-10-16 DIAGNOSIS — E039 Hypothyroidism, unspecified: Secondary | ICD-10-CM | POA: Diagnosis not present

## 2022-10-16 DIAGNOSIS — S72091D Other fracture of head and neck of right femur, subsequent encounter for closed fracture with routine healing: Secondary | ICD-10-CM | POA: Diagnosis not present

## 2022-10-16 DIAGNOSIS — I442 Atrioventricular block, complete: Secondary | ICD-10-CM | POA: Diagnosis not present

## 2022-10-16 DIAGNOSIS — E038 Other specified hypothyroidism: Secondary | ICD-10-CM | POA: Diagnosis not present

## 2022-10-16 DIAGNOSIS — R231 Pallor: Secondary | ICD-10-CM | POA: Diagnosis not present

## 2022-10-16 DIAGNOSIS — R609 Edema, unspecified: Secondary | ICD-10-CM | POA: Diagnosis not present

## 2022-10-16 DIAGNOSIS — R262 Difficulty in walking, not elsewhere classified: Secondary | ICD-10-CM | POA: Diagnosis not present

## 2022-10-16 DIAGNOSIS — R2681 Unsteadiness on feet: Secondary | ICD-10-CM | POA: Diagnosis not present

## 2022-10-16 DIAGNOSIS — Z471 Aftercare following joint replacement surgery: Secondary | ICD-10-CM | POA: Diagnosis not present

## 2022-10-16 DIAGNOSIS — Z4789 Encounter for other orthopedic aftercare: Secondary | ICD-10-CM | POA: Diagnosis not present

## 2022-10-16 DIAGNOSIS — R404 Transient alteration of awareness: Secondary | ICD-10-CM | POA: Diagnosis not present

## 2022-10-16 DIAGNOSIS — N189 Chronic kidney disease, unspecified: Secondary | ICD-10-CM | POA: Diagnosis not present

## 2022-10-16 DIAGNOSIS — R0602 Shortness of breath: Secondary | ICD-10-CM | POA: Diagnosis not present

## 2022-10-16 DIAGNOSIS — N1831 Chronic kidney disease, stage 3a: Secondary | ICD-10-CM | POA: Diagnosis not present

## 2022-10-16 DIAGNOSIS — Z7401 Bed confinement status: Secondary | ICD-10-CM | POA: Diagnosis not present

## 2022-10-16 DIAGNOSIS — Z9181 History of falling: Secondary | ICD-10-CM | POA: Diagnosis not present

## 2022-10-16 DIAGNOSIS — M6281 Muscle weakness (generalized): Secondary | ICD-10-CM | POA: Diagnosis not present

## 2022-10-16 DIAGNOSIS — R531 Weakness: Secondary | ICD-10-CM | POA: Diagnosis not present

## 2022-10-16 DIAGNOSIS — R0689 Other abnormalities of breathing: Secondary | ICD-10-CM | POA: Diagnosis not present

## 2022-10-16 DIAGNOSIS — Z515 Encounter for palliative care: Secondary | ICD-10-CM | POA: Diagnosis not present

## 2022-10-16 DIAGNOSIS — R2689 Other abnormalities of gait and mobility: Secondary | ICD-10-CM | POA: Diagnosis not present

## 2022-10-16 DIAGNOSIS — E8770 Fluid overload, unspecified: Secondary | ICD-10-CM | POA: Diagnosis not present

## 2022-10-16 DIAGNOSIS — I5042 Chronic combined systolic (congestive) and diastolic (congestive) heart failure: Secondary | ICD-10-CM | POA: Diagnosis not present

## 2022-10-16 DIAGNOSIS — I2510 Atherosclerotic heart disease of native coronary artery without angina pectoris: Secondary | ICD-10-CM | POA: Diagnosis not present

## 2022-10-16 DIAGNOSIS — E119 Type 2 diabetes mellitus without complications: Secondary | ICD-10-CM | POA: Diagnosis not present

## 2022-10-16 DIAGNOSIS — N183 Chronic kidney disease, stage 3 unspecified: Secondary | ICD-10-CM | POA: Diagnosis not present

## 2022-10-16 DIAGNOSIS — I517 Cardiomegaly: Secondary | ICD-10-CM | POA: Diagnosis not present

## 2022-10-16 DIAGNOSIS — I110 Hypertensive heart disease with heart failure: Secondary | ICD-10-CM | POA: Diagnosis not present

## 2022-10-16 DIAGNOSIS — R4182 Altered mental status, unspecified: Secondary | ICD-10-CM | POA: Diagnosis not present

## 2022-10-16 DIAGNOSIS — Z20822 Contact with and (suspected) exposure to covid-19: Secondary | ICD-10-CM | POA: Diagnosis not present

## 2022-10-16 DIAGNOSIS — Z79899 Other long term (current) drug therapy: Secondary | ICD-10-CM | POA: Diagnosis not present

## 2022-10-16 DIAGNOSIS — N390 Urinary tract infection, site not specified: Secondary | ICD-10-CM | POA: Diagnosis not present

## 2022-10-16 DIAGNOSIS — R918 Other nonspecific abnormal finding of lung field: Secondary | ICD-10-CM | POA: Diagnosis not present

## 2022-10-16 DIAGNOSIS — S72141A Displaced intertrochanteric fracture of right femur, initial encounter for closed fracture: Secondary | ICD-10-CM | POA: Diagnosis not present

## 2022-10-16 DIAGNOSIS — I480 Paroxysmal atrial fibrillation: Secondary | ICD-10-CM | POA: Diagnosis not present

## 2022-10-16 DIAGNOSIS — I509 Heart failure, unspecified: Secondary | ICD-10-CM | POA: Diagnosis not present

## 2022-10-16 DIAGNOSIS — R41841 Cognitive communication deficit: Secondary | ICD-10-CM | POA: Diagnosis not present

## 2022-10-16 DIAGNOSIS — E1122 Type 2 diabetes mellitus with diabetic chronic kidney disease: Secondary | ICD-10-CM | POA: Diagnosis not present

## 2022-10-16 DIAGNOSIS — F05 Delirium due to known physiological condition: Secondary | ICD-10-CM | POA: Diagnosis not present

## 2022-10-16 DIAGNOSIS — S72031D Displaced midcervical fracture of right femur, subsequent encounter for closed fracture with routine healing: Secondary | ICD-10-CM | POA: Diagnosis not present

## 2022-10-16 DIAGNOSIS — E875 Hyperkalemia: Secondary | ICD-10-CM | POA: Diagnosis not present

## 2022-10-16 DIAGNOSIS — D649 Anemia, unspecified: Secondary | ICD-10-CM | POA: Diagnosis not present

## 2022-10-16 DIAGNOSIS — R300 Dysuria: Secondary | ICD-10-CM | POA: Diagnosis not present

## 2022-10-16 DIAGNOSIS — Z91040 Latex allergy status: Secondary | ICD-10-CM | POA: Diagnosis not present

## 2022-10-16 LAB — GLUCOSE, CAPILLARY
Glucose-Capillary: 113 mg/dL — ABNORMAL HIGH (ref 70–99)
Glucose-Capillary: 130 mg/dL — ABNORMAL HIGH (ref 70–99)
Glucose-Capillary: 117 mg/dL — ABNORMAL HIGH (ref 70–99)

## 2022-10-16 MED ORDER — POLYETHYLENE GLYCOL 3350 17 G PO PACK: 17.0000 g | PACK | Freq: Two times a day (BID) | ORAL | 0 refills | Status: DC

## 2022-10-16 MED ORDER — NYSTATIN 100000 UNIT/GM EX POWD: Freq: Three times a day (TID) | CUTANEOUS | 0 refills | Status: DC | PRN

## 2022-10-16 MED ORDER — OXYCODONE HCL 5 MG PO TABS
2.5000 mg | ORAL_TABLET | Freq: Four times a day (QID) | ORAL | Status: DC | PRN
  Administered 2022-10-16: 5 mg via ORAL
  Filled 2022-10-16: qty 1

## 2022-10-16 MED ORDER — DOCUSATE SODIUM 100 MG PO CAPS: 100.0000 mg | ORAL_CAPSULE | Freq: Two times a day (BID) | ORAL | 0 refills | Status: DC

## 2022-10-16 MED ORDER — ENSURE ENLIVE PO LIQD: 237.0000 mL | Freq: Two times a day (BID) | ORAL | 12 refills | Status: DC

## 2022-10-16 MED ORDER — TRAMADOL HCL 50 MG PO TABS: 50.0000 mg | ORAL_TABLET | Freq: Four times a day (QID) | ORAL | 0 refills | Status: DC | PRN

## 2022-10-16 MED ORDER — ADULT MULTIVITAMIN W/MINERALS CH: 1.0000 | ORAL_TABLET | Freq: Every day | ORAL | 2 refills | Status: DC

## 2022-10-16 MED ORDER — LIDOCAINE 5 % EX PTCH: 1.0000 | MEDICATED_PATCH | CUTANEOUS | 0 refills | Status: DC

## 2022-10-16 MED ORDER — ACETAMINOPHEN ER 650 MG PO TBCR: 650.0000 mg | EXTENDED_RELEASE_TABLET | Freq: Three times a day (TID) | ORAL | 0 refills | Status: DC | PRN

## 2022-10-16 MED ORDER — ENOXAPARIN SODIUM 30 MG/0.3ML IJ SOSY: 30.0000 mg | PREFILLED_SYRINGE | Freq: Every day | INTRAMUSCULAR | 0 refills | Status: DC

## 2022-10-16 MED ORDER — ONDANSETRON HCL 4 MG PO TABS: 4.0000 mg | ORAL_TABLET | Freq: Four times a day (QID) | ORAL | 0 refills | Status: DC | PRN

## 2022-10-16 NOTE — TOC Progression Note (Addendum)
Transition of Care [facility]) - Progression Note    Patient Details  Name: Beverly Kim MRN: [id number] Date of Birth: [birth year]  [facility] CM/SW Contact  Lorri Frederick, LCSW Phone Number: 10/16/2022, 9:50 AM  Clinical Narrative:   CSW spoke with pt and daughter [name], they will accept offer at [facility].  HTA updated with facility choice.  1510: TC Stephanie/HTA.  Auth approved for SNF: [id number], Ptar: [id number].  Message sent to MD.    Expected Discharge Plan: Skilled Nursing Facility Barriers to Discharge: Continued Medical Work up, SNF Pending bed offer  Expected Discharge Plan and Services In-house Referral: Clinical Social Work   Post Acute Care Choice: Skilled Nursing Facility Living arrangements for the past 2 months: Skilled Nursing Facility                                       Social Determinants of Health (SDOH) Interventions SDOH Screenings   Food Insecurity: No Food Insecurity (09/10/2022)  Housing: Low Risk  (09/03/2022)  Transportation Needs: No Transportation Needs (09/10/2022)  Utilities: Not At Risk (09/03/2022)  Alcohol Screen: Low Risk  (09/11/2019)  Depression (PHQ2-9): Low Risk  (09/11/2019)  Financial Resource Strain: Low Risk  (09/11/2019)  Physical Activity: Sufficiently Active (08/19/2018)  Social Connections: Moderately Integrated (09/11/2019)  Stress: No Stress Concern Present (09/11/2019)  Tobacco Use: Low Risk  (10/11/2022)    Readmission Risk Interventions    09/07/2022    3:18 PM  Readmission Risk Prevention Plan  Post Dischage Appt Complete  Medication Screening Complete  Transportation Screening Complete

## 2022-10-16 NOTE — Progress Notes (Signed)
Mobility Specialist: Progress Note   10/16/22 1400  Mobility  Activity Transferred from bed to chair  Level of Assistance Maximum assist, patient does 25-49% (+2)  Assistive Device Front wheel walker  Distance Ambulated (ft) 2 ft  RLE Weight Bearing WBAT  Activity Response Tolerated poorly  Mobility Referral Yes  $Mobility charge 1 Mobility  Mobility Specialist Start Time (ACUTE ONLY) 1355  Mobility Specialist Stop Time (ACUTE ONLY) 1418  Mobility Specialist Time Calculation (min) (ACUTE ONLY) 23 min    Pt was agreeable to mobility session after encouragement - received in bed. Pt was not feeling confident about movement without maxisky assistance but eventually agreed. Had c/o LE pain and weakness throughout. Required MaxA +2 for bed mobility, modA + 2 for STS and beginning of transfer. Despite multiple verbal and tactile cues, pt stated she was not able to move her feet. Mobility specialist physically assisted in ambulation for ~2 steps before pt had c/o of pain and weakness. Pt became a totalA +2 and was pivoted to chair. Left in chair with all needs met, call bell in reach and chair alarm on. Family in room.    Maurene Capes Mobility Specialist Please contact via SecureChat or Rehab office at [phone]

## 2022-10-16 NOTE — Plan of Care (Signed)
Problem: Education: Goal: Ability to describe self-care measures that may prevent or decrease complications (Diabetes Survival Skills Education) will improve Outcome: Progressing Goal: Individualized Educational Video(s) Outcome: Progressing   Problem: Coping: Goal: Ability to adjust to condition or change in health will improve Outcome: Progressing   Problem: Fluid Volume: Goal: Ability to maintain a balanced intake and output will improve Outcome: Progressing   Problem: Health Behavior/Discharge Planning: Goal: Ability to identify and utilize available resources and services will improve Outcome: Progressing Goal: Ability to manage health-related needs will improve Outcome: Progressing   Problem: Metabolic: Goal: Ability to maintain appropriate glucose levels will improve Outcome: Progressing   Problem: Nutritional: Goal: Maintenance of adequate nutrition will improve Outcome: Progressing Goal: Progress toward achieving an optimal weight will improve Outcome: Progressing   Problem: Skin Integrity: Goal: Risk for impaired skin integrity will decrease Outcome: Progressing   Problem: Tissue Perfusion: Goal: Adequacy of tissue perfusion will improve Outcome: Progressing   Problem: Education: Goal: Knowledge of General Education information will improve Description: Including pain rating scale, medication(s)/side effects and non-pharmacologic comfort measures Outcome: Progressing   Problem: Health Behavior/Discharge Planning: Goal: Ability to manage health-related needs will improve Outcome: Progressing   Problem: Clinical Measurements: Goal: Ability to maintain clinical measurements within normal limits will improve Outcome: Progressing Goal: Will remain free from infection Outcome: Progressing Goal: Diagnostic test results will improve Outcome: Progressing Goal: Respiratory complications will improve Outcome: Progressing Goal: Cardiovascular complication will  be avoided Outcome: Progressing   Problem: Activity: Goal: Risk for activity intolerance will decrease Outcome: Progressing   Problem: Nutrition: Goal: Adequate nutrition will be maintained Outcome: Progressing   Problem: Coping: Goal: Level of anxiety will decrease Outcome: Progressing   Problem: Elimination: Goal: Will not experience complications related to bowel motility Outcome: Progressing Goal: Will not experience complications related to urinary retention Outcome: Progressing   Problem: Pain Managment: Goal: General experience of comfort will improve Outcome: Progressing   Problem: Safety: Goal: Ability to remain free from injury will improve Outcome: Progressing   Problem: Skin Integrity: Goal: Risk for impaired skin integrity will decrease Outcome: Progressing   Problem: Education: Goal: Knowledge of General Education information will improve Description: Including pain rating scale, medication(s)/side effects and non-pharmacologic comfort measures Outcome: Progressing   Problem: Health Behavior/Discharge Planning: Goal: Ability to manage health-related needs will improve Outcome: Progressing   Problem: Clinical Measurements: Goal: Ability to maintain clinical measurements within normal limits will improve Outcome: Progressing Goal: Will remain free from infection Outcome: Progressing Goal: Diagnostic test results will improve Outcome: Progressing Goal: Respiratory complications will improve Outcome: Progressing Goal: Cardiovascular complication will be avoided Outcome: Progressing   Problem: Activity: Goal: Risk for activity intolerance will decrease Outcome: Progressing   Problem: Nutrition: Goal: Adequate nutrition will be maintained Outcome: Progressing   Problem: Coping: Goal: Level of anxiety will decrease Outcome: Progressing   Problem: Elimination: Goal: Will not experience complications related to bowel motility Outcome:  Progressing Goal: Will not experience complications related to urinary retention Outcome: Progressing   Problem: Pain Managment: Goal: General experience of comfort will improve Outcome: Progressing   Problem: Safety: Goal: Ability to remain free from injury will improve Outcome: Progressing   Problem: Skin Integrity: Goal: Risk for impaired skin integrity will decrease Outcome: Progressing   Problem: Education: Goal: Verbalization of understanding the information provided (i.e., activity precautions, restrictions, etc) will improve Outcome: Progressing Goal: Individualized Educational Video(s) Outcome: Progressing   Problem: Activity: Goal: Ability to ambulate and perform ADLs will improve Outcome: Progressing     Problem: Clinical Measurements: Goal: Postoperative complications will be avoided or minimized Outcome: Progressing   

## 2022-10-16 NOTE — Plan of Care (Addendum)
Report given to [employer]. Will continue to monitor.   Problem: Education: Goal: Ability to describe self-care measures that may prevent or decrease complications (Diabetes Survival Skills Education) will improve Outcome: Progressing   Problem: Skin Integrity: Goal: Risk for impaired skin integrity will decrease Outcome: Progressing   Problem: Education: Goal: Knowledge of General Education information will improve Description: Including pain rating scale, medication(s)/side effects and non-pharmacologic comfort measures Outcome: Progressing   Problem: Activity: Goal: Risk for activity intolerance will decrease Outcome: Progressing   Problem: Pain Managment: Goal: General experience of comfort will improve Outcome: Progressing   Problem: Safety: Goal: Ability to remain free from injury will improve Outcome: Progressing   Problem: Skin Integrity: Goal: Risk for impaired skin integrity will decrease Outcome: Progressing

## 2022-10-16 NOTE — Progress Notes (Deleted)
PROGRESS NOTE    Beverly Kim  ZOX:[id number] DOB: August 14, 1924 DOA: 10/09/2022 PCP: Pincus Sanes, MD   Brief Narrative:  [name] a 89 yo F with hx of AF not on AC (recently stopped Eliquis), non-occlusive CAD, CHB, CKD, CHF and Saint Jude pacemaker who had an unwitnessed fall at home with right hip pain unable to bear weight.  Imaging confirms right femoral neck fracture.  Hospitalist called for admission, orthopedics called in consult.  Medically stable for discharge - awaiting safe disposition/acceptance to SNF  Assessment & Plan:   Principal Problem:   Fracture of femoral neck, right (HCC) Active Problems:   Intertrochanteric fx-closed, right, initial encounter (HCC)   Chronic combined systolic and diastolic CHF (congestive heart failure) (HCC)   CKD stage 3a, GFR 45-59 ml/min (HCC)   Hypothyroidism   Essential hypertension   GERD   Diabetes (HCC)   Complete heart block (HCC)   Paroxysmal atrial fibrillation (HCC)  Intertrochanteric fx-closed, right, initial encounter (HCC) -Fracture confirmed on imaging, Dr. Carola Frost performed hemiarthroplasty 10/11/22, patient tolerated well -Pain currently well controlled; unfortunately patient does have known issues with hallucinations while on narcotics - will attempt to wean aggressively as tolerated. -Avoid high dose/around the clock NSAIDs given CKD3a. -Cardiology initially following for preop clearance given age and comorbid conditions (now signed off), otherwise pain well-controlled, PT OT ongoing.     Hospital delirium versus sundowning Likely concurrent toxic encephalopathy vs polypharmacy  -Patient has known sensitivity to codeine/narcotics with hallucinations.  Medications have been weaned and made as sparingly as possible to control pain. -Patient alert and oriented but continues to have episodes of auditory hallucinations after narcotic dosing.  -Continue appropriate sleep hygiene/attempt to maintain patient on appropriate  sleep schedule.  CKD stage 3a, GFR 45-59 ml/min (HCC) -At baseline (creatinine 1.5) follow p.o. intake, urine output -Change to NS until PO intake appropriate   Hyponatremia, iatrogenic -Patient previously placed on 1/2NS with downtrending sodium -Stabilized after normal saline and increase p.o. intake - off IVF  Chronic combined systolic and diastolic CHF (congestive heart failure) (HCC) Continue home lasix; mild BLE edema   Paroxysmal atrial fibrillation (HCC) Rate controlled. Not on anticoagulation at this time. Was on warfarin - transitioned to Eliquis but this too was discontinued due to "intolerance"  Complete heart block (HCC) Saint Jude pacemaker intact/functioning appropriately, cardiology evaluated for preop clearance as above   Diabetes (HCC), diet controlled A1C 6.7   Continue sliding scale insulin, hypoglycemic protocol, low-carb diet  GERD Continue PPI   Intertrigo, POA -Nystatin topical powder  Essential hypertension BP well controlled at time of admission on home medications.  Hypothyroidism Last TSH 7/1 - 5.0 -Continue levothyroxine  DVT prophylaxis: enoxaparin (LOVENOX) injection 30 mg Start: 10/12/22 1115 SCDs Start: 10/11/22 1856 Code Status:   Code Status: DNR Family Communication:  Daughter at bedside  Status is: Inpatient  Dispo: The patient is from: Home              Anticipated d/c is to: SNF              Anticipated d/c date is: Imminent - pending safe dispo              Patient currently IS medically stable for discharge  Consultants:  Orthopedic surgery  Procedures:  ORIF 10/11/2022  Antimicrobials:  Perioperatively  Subjective: No acute issues or events overnight, patient incredibly hard of hearing -on bedpan this am with profound hip pain - discussed utilizing the bedside commode.  Objective: Vitals:   10/15/22 1554 10/15/22 2208 10/16/22 0500 10/16/22 0534  BP: (!) 126/41 (!) 125/48  (!) 129/55  Pulse: 67 62  61  Resp:     18  Temp: 97.8 F (36.6 C) 97.8 F (36.6 C)  97.8 F (36.6 C)  TempSrc: Oral   Oral  SpO2: 100% 100%  96%  Weight:   75.9 kg   Height:        Intake/Output Summary (Last 24 hours) at 10/16/2022 0657 Last data filed at 10/16/2022 0600 Gross per 24 hour  Intake 120 ml  Output 1800 ml  Net -1680 ml   Filed Weights   10/11/22 1248 10/16/22 0500  Weight: 70.3 kg 75.9 kg    Examination:  General: Mild distress on bedside commode HEENT: Profoundly diminished hearing bilaterally Neck:  Without mass or deformity.  Trachea is midline. Lungs:  Clear to auscultate bilaterally without rhonchi, wheeze, or rales. Heart:  Regular rate and rhythm.  Without murmurs, rubs, or gallops. Abdomen:  Soft, nontender, nondistended.  Without guarding or rebound. Extremities: R hip bandage clean/dry/intact Skin:  Warm and dry, no erythema.  Data Reviewed: I have personally reviewed following labs and imaging studies   Scheduled Meds:  acetaminophen  1,000 mg Oral TID   bisoprolol  2.5 mg Oral Daily   docusate sodium  100 mg Oral BID   enoxaparin (LOVENOX) injection  30 mg Subcutaneous Daily   feeding supplement  237 mL Oral BID BM   furosemide  40 mg Oral Daily   insulin aspart  0-15 Units Subcutaneous TID WC   levothyroxine  112 mcg Oral QAC breakfast   lidocaine  1 patch Transdermal Q24H   metolazone  2.5 mg Oral Once per day on Monday Wednesday Saturday   multivitamin with minerals  1 tablet Oral Daily   mupirocin ointment  1 Application Nasal BID   nystatin   Topical TID   pantoprazole  40 mg Oral Daily   polyethylene glycol  17 g Oral Daily   potassium chloride  10 mEq Oral Once per day on Monday Wednesday Saturday   senna  17.2 mg Oral Daily   traZODone  50 mg Oral QHS     LOS: 7 days   Time spent:  [name], DO [facility]  If 7PM-7AM, please contact night-coverage [email or web address]  10/16/2022, 6:57 AM

## 2022-10-16 NOTE — Discharge Summary (Signed)
Physician Discharge Summary  Beverly Kim:[id number] DOB: 1924-03-29 DOA: 10/09/2022  PCP: Pincus Sanes, MD  Admit date: 10/09/2022 Discharge date: 10/16/2022  Admitted From: Home Disposition:  SNF  Recommendations for Outpatient Follow-up:  Follow up with PCP in 1-2 weeks Follow up with orthopedic surgery as scheduled  Discharge Condition:Stable  CODE STATUS:DNR  Diet recommendation: As tolerated regular diet; advance to low salt/low fat/low carb diet once PO intake improves     Brief/Interim Summary: Ms. Buckaloo a 89 yo F with hx of AF not on AC (recently stopped Eliquis), non-occlusive CAD, CHB, CKD, CHF and Saint Jude pacemaker who had an unwitnessed fall at home with right hip pain unable to bear weight.  Imaging confirms right femoral neck fracture.  Hospitalist called for admission, orthopedics called in consult.   Medically stable for discharge - awaiting safe disposition/acceptance to SNF  Discharge Diagnoses:  Principal Problem:   Fracture of femoral neck, right (HCC) Active Problems:   Intertrochanteric fx-closed, right, initial encounter (HCC)   Chronic combined systolic and diastolic CHF (congestive heart failure) (HCC)   CKD stage 3a, GFR 45-59 ml/min (HCC)   Hypothyroidism   Essential hypertension   GERD   Diabetes (HCC)   Complete heart block (HCC)   Paroxysmal atrial fibrillation (HCC)   Intertrochanteric fx-closed, right, initial encounter (HCC) -Fracture confirmed on imaging, Dr. Carola Frost performed hemiarthroplasty 10/11/22, patient tolerated well -Pain currently well controlled; unfortunately patient does have known issues with hallucinations while on narcotics - will continue non-narcotics at discharge -NSAID use only as necessary given [id number] -PT/OT recommending ongoing rehab/therapy as above - Dispo planning to [facility] delirium versus sundowning Likely concurrent toxic encephalopathy vs polypharmacy  -Patient has known sensitivity to  codeine/narcotics with hallucinations.  Medications have been weaned and made as sparingly as possible to control pain. -Patient alert and oriented - diminished hearing but appropriate answers once she understands what the question/prompt is. -Continue appropriate sleep hygiene/attempt to maintain patient on appropriate sleep schedule.   CKD stage 3a, GFR 45-59 ml/min (HCC) -At baseline (creatinine 1.5) follow p.o. intake, urine output -Change to NS until PO intake appropriate   Hyponatremia, iatrogenic -Patient previously placed on 1/2NS with downtrending sodium -Stabilized after normal saline and increase p.o. intake - off IVF   Chronic combined systolic and diastolic CHF (congestive heart failure) (HCC) Continue home lasix; mild BLE edema   Paroxysmal atrial fibrillation (HCC) Rate controlled. Not on anticoagulation at this time. Was on warfarin - transitioned to Eliquis but this too was discontinued due to "intolerance"   Complete heart block (HCC) Saint Jude pacemaker intact/functioning appropriately, cardiology evaluated for preop clearance as above   Diabetes (HCC), diet controlled A1C 6.7   Continue sliding scale insulin, hypoglycemic protocol, low-carb diet   GERD Continue PPI   Intertrigo, POA -Nystatin topical powder   Essential hypertension BP well controlled at time of admission on home medications.   Hypothyroidism Last TSH 7/1 - 5.0 -Continue levothyroxine  Discharge Instructions   Allergies as of 10/16/2022       Reactions   Adhesive [tape] Itching, Dermatitis, Rash, Other (See Comments)   Blisters and "skin bubbles"   Ace Inhibitors Cough   Atorvastatin Other (See Comments)   REACTION: Reaction not known   Clindamycin Other (See Comments)   Unknown   Codeine Other (See Comments)   hallucinations    Latex Other (See Comments)   blisters   Shellfish Allergy Other (See Comments)   "gallbladder attack"  Simvastatin Other (See Comments)   fatigue    Penicillins Rash        Medication List     STOP taking these medications    Anucort-HC 25 MG suppository Generic drug: hydrocortisone   apixaban 5 MG Tabs tablet Commonly known as: ELIQUIS   ondansetron 4 MG disintegrating tablet Commonly known as: ZOFRAN-ODT       TAKE these medications    acetaminophen 650 MG CR tablet Commonly known as: TYLENOL Take 1 tablet (650 mg total) by mouth every 8 (eight) hours as needed for pain.   albuterol 108 (90 Base) MCG/ACT inhaler Commonly known as: VENTOLIN HFA Inhale 1-2 puffs into the lungs every 6 (six) hours as needed for wheezing or shortness of breath.   bisoprolol 5 MG tablet Commonly known as: ZEBETA Take 0.5 tablets (2.5 mg total) by mouth daily.   docusate sodium 100 MG capsule Commonly known as: COLACE Take 1 capsule (100 mg total) by mouth 2 (two) times daily.   enoxaparin 30 MG/0.3ML injection Commonly known as: LOVENOX Inject 0.3 mLs (30 mg total) into the skin daily for 21 days. Start taking on: October 17, 2022   feeding supplement Liqd Take 237 mLs by mouth 2 (two) times daily between meals.   furosemide 40 MG tablet Commonly known as: LASIX TAKE 1 TABLET BY MOUTH EVERY DAY What changed:  how much to take how to take this when to take this additional instructions   lidocaine 5 % Commonly known as: LIDODERM Place 1 patch onto the skin daily. Apply to affected area as directed. Remove & Discard patch within 12 hours. Alternate sites as possible. Start taking on: October 17, 2022   metolazone 2.5 MG tablet Commonly known as: ZAROXOLYN TAKE 1 TABLET 3 TIMES WEEKLY (Monday,  Wednesday, Saturday) What changed:  how much to take how to take this when to take this additional instructions   multivitamin with minerals Tabs tablet Take 1 tablet by mouth daily. Start taking on: October 17, 2022   nystatin powder Commonly known as: MYCOSTATIN/NYSTOP Apply topically 3 (three) times daily as needed.  To affected skin folds (under breast and abdomen)   ondansetron 4 MG tablet Commonly known as: ZOFRAN Take 1 tablet (4 mg total) by mouth every 6 (six) hours as needed for nausea.   pantoprazole 40 MG tablet Commonly known as: PROTONIX TAKE 1 TABLET BY MOUTH EVERY DAY   polyethylene glycol 17 g packet Commonly known as: MIRALAX / GLYCOLAX Take 17 g by mouth 2 (two) times daily. What changed: when to take this   potassium chloride 10 MEQ tablet Commonly known as: KLOR-CON TAKE 1 TABLET ( 10 MEQ total) BY MOUTH ONCE DAILY ON  DAYS  METOLAZONE  IS  TAKEN What changed:  how much to take how to take this when to take this additional instructions   promethazine 12.5 MG tablet Commonly known as: PHENERGAN Take 0.5 tablets (6.25 mg total) by mouth every 6 (six) hours as needed for nausea or vomiting.   Senokot Extra Strength 17.2 MG Tabs Generic drug: Sennosides Take 34.4 mg by mouth daily. Take 2 tablets by mouth daily   Synthroid 112 MCG tablet Generic drug: levothyroxine Take 1 tablet (112 mcg total) by mouth daily before breakfast.   traMADol 50 MG tablet Commonly known as: ULTRAM Take 1 tablet (50 mg total) by mouth every 6 (six) hours as needed for severe pain.   traZODone 50 MG tablet Commonly known as: DESYREL TAKE 1/2 TO  1 TABLET (25 TO 50 MG TOTAL) BY MOUTH AT BEDTIME What changed: See the new instructions.        Contact information for follow-up providers     Myrene Galas, MD. Schedule an appointment as soon as possible for a visit in 14 day(s).   Specialty: Orthopedic Surgery Contact information: [address] [place] [address] [phone]              Contact information for after-discharge care     Destination     [facility] Preferred SNF .   Service: Skilled Nursing Contact information: [address] [place] [address] [phone]                    Allergies  Allergen Reactions    Adhesive [Tape] Itching, Dermatitis, Rash and Other (See Comments)    Blisters and "skin bubbles"   Ace Inhibitors Cough   Atorvastatin Other (See Comments)    REACTION: Reaction not known   Clindamycin Other (See Comments)    Unknown   Codeine Other (See Comments)    hallucinations    Latex Other (See Comments)    blisters   Shellfish Allergy Other (See Comments)    "gallbladder attack"   Simvastatin Other (See Comments)    fatigue   Penicillins Rash    Consultations: Ortho   Procedures/Studies: DG Hip Port Unilat With Pelvis 1V Right  Result Date: 10/11/2022 CLINICAL DATA:  Right femoral neck fracture, postoperative day 0 for unipolar right hip hemiarthroplasty EXAM: DG HIP (WITH OR WITHOUT PELVIS) 1V PORT RIGHT COMPARISON:  10/09/2022 FINDINGS: Right hip hemiarthroplasty in place, no visible periprosthetic fracture or acute complicating feature on frontal hip and pelvic radiographs. Mild degenerative chondral thinning in the left hip. IMPRESSION: 1. Right hip hemiarthroplasty without acute complicating feature. Electronically Signed   By: Gaylyn Rong M.D.   On: 10/11/2022 18:53   CT Head Wo Contrast  Result Date: 10/09/2022 CLINICAL DATA:  89 year old female history of trauma from a fall. Head and neck pain. EXAM: CT HEAD WITHOUT CONTRAST CT CERVICAL SPINE WITHOUT CONTRAST TECHNIQUE: Multidetector CT imaging of the head and cervical spine was performed following the standard protocol without intravenous contrast. Multiplanar CT image reconstructions of the cervical spine were also generated. RADIATION DOSE REDUCTION: This exam was performed according to the departmental dose-optimization program which includes automated exposure control, adjustment of the mA and/or kV according to patient size and/or use of iterative reconstruction technique. COMPARISON:  Head CT 10/03/2022.  No prior cervical spine CT. FINDINGS: CT HEAD FINDINGS Brain: Mild cerebral atrophy. Patchy and confluent  areas of decreased attenuation are noted throughout the deep and periventricular white matter of the cerebral hemispheres bilaterally, compatible with chronic microvascular ischemic disease. No evidence of acute infarction, hemorrhage, hydrocephalus, extra-axial collection or mass lesion/mass effect. Vascular: No hyperdense vessel or unexpected calcification. Skull: Normal. Negative for fracture or focal lesion. Sinuses/Orbits: No acute finding. Other: Small left mastoid effusion, unchanged. CT CERVICAL SPINE FINDINGS Comment: Study is limited by considerable patient motion. Alignment: Normal. Skull base and vertebrae: No acute fracture. No primary bone lesion or focal pathologic process. Soft tissues and spinal canal: No prevertebral fluid or swelling. No visible canal hematoma. Disc levels: Multilevel degenerative disc disease, most pronounced at C5-C6. Moderate multilevel facet arthropathy bilaterally. Upper chest: Negative. Other: None. IMPRESSION: 1. No evidence of significant acute traumatic injury to the skull, brain or cervical spine. 2. Mild cerebral atrophy with chronic microvascular ischemic  changes in the cerebral white matter, as above. 3. Multilevel degenerative disc disease and cervical spondylosis, as above. Electronically Signed   By: Trudie Reed M.D.   On: 10/09/2022 08:08   CT Cervical Spine Wo Contrast  Result Date: 10/09/2022 CLINICAL DATA:  89 year old female history of trauma from a fall. Head and neck pain. EXAM: CT HEAD WITHOUT CONTRAST CT CERVICAL SPINE WITHOUT CONTRAST TECHNIQUE: Multidetector CT imaging of the head and cervical spine was performed following the standard protocol without intravenous contrast. Multiplanar CT image reconstructions of the cervical spine were also generated. RADIATION DOSE REDUCTION: This exam was performed according to the departmental dose-optimization program which includes automated exposure control, adjustment of the mA and/or kV according to  patient size and/or use of iterative reconstruction technique. COMPARISON:  Head CT 10/03/2022.  No prior cervical spine CT. FINDINGS: CT HEAD FINDINGS Brain: Mild cerebral atrophy. Patchy and confluent areas of decreased attenuation are noted throughout the deep and periventricular white matter of the cerebral hemispheres bilaterally, compatible with chronic microvascular ischemic disease. No evidence of acute infarction, hemorrhage, hydrocephalus, extra-axial collection or mass lesion/mass effect. Vascular: No hyperdense vessel or unexpected calcification. Skull: Normal. Negative for fracture or focal lesion. Sinuses/Orbits: No acute finding. Other: Small left mastoid effusion, unchanged. CT CERVICAL SPINE FINDINGS Comment: Study is limited by considerable patient motion. Alignment: Normal. Skull base and vertebrae: No acute fracture. No primary bone lesion or focal pathologic process. Soft tissues and spinal canal: No prevertebral fluid or swelling. No visible canal hematoma. Disc levels: Multilevel degenerative disc disease, most pronounced at C5-C6. Moderate multilevel facet arthropathy bilaterally. Upper chest: Negative. Other: None. IMPRESSION: 1. No evidence of significant acute traumatic injury to the skull, brain or cervical spine. 2. Mild cerebral atrophy with chronic microvascular ischemic changes in the cerebral white matter, as above. 3. Multilevel degenerative disc disease and cervical spondylosis, as above. Electronically Signed   By: Trudie Reed M.D.   On: 10/09/2022 08:08   CT Lumbar Spine Wo Contrast  Result Date: 10/09/2022 CLINICAL DATA:  Back trauma, no prior imaging (Age >= 16y) EXAM: CT LUMBAR SPINE WITHOUT CONTRAST TECHNIQUE: Multidetector CT imaging of the lumbar spine was performed without intravenous contrast administration. Multiplanar CT image reconstructions were also generated. RADIATION DOSE REDUCTION: This exam was performed according to the departmental dose-optimization  program which includes automated exposure control, adjustment of the mA and/or kV according to patient size and/or use of iterative reconstruction technique. COMPARISON:  BCT AP 10/03/22 FINDINGS: Segmentation: 5 lumbar type vertebrae. Alignment: Trace retrolisthesis of T12-L1. Grade 1 anterolisthesis of L4 on L5. Grade 1-2 anterolisthesis of on S1. Vertebrae: Vertebral body heights are maintained. No focal pathologic process. Paraspinal and other soft tissues: Bibasilar atelectasis. Aortic atherosclerotic calcifications. Status post cholecystectomy. No evidence of hydronephrosis or nephrolithiasis. Compared to prior exam there is increased fluid in the right lower quadrant, which is nonspecific and may be reactive. Disc levels: There is no evidence of high-grade spinal canal stenosis. Moderate bilateral foraminal narrowing at L5-S1. There is degenerative fusion of the L4-L5 and L5-S1 facet joints. IMPRESSION: 1. No acute fracture or traumatic listhesis. 2. Compared to prior exam there is increased fluid in the right lower quadrant, which is nonspecific and may be reactive Aortic Atherosclerosis (ICD10-I70.0). Electronically Signed   By: Lorenza Cambridge M.D.   On: 10/09/2022 08:08   DG Pelvis 1-2 Views  Result Date: 10/09/2022 CLINICAL DATA:  Trauma, fall EXAM: PELVIS - 1-2 VIEW COMPARISON:  05/04/2003 FINDINGS: There is a displaced  fracture in the neck of right femur. There is superior displacement of distal major fracture fragment. There is no dislocation in the right hip. Arterial calcifications are seen in soft tissues. IMPRESSION: Displaced fracture is seen in the neck of right femur. Electronically Signed   By: Ernie Avena M.D.   On: 10/09/2022 07:47   DG FEMUR, MIN 2 VIEWS RIGHT  Result Date: 10/09/2022 CLINICAL DATA:  Trauma, fall EXAM: RIGHT FEMUR 2 VIEWS COMPARISON:  None FINDINGS: There is fracture in the subcapital portion of neck of right femur. There is superior displacement of distal  fracture fragment. There is no dislocation in the hip. Degenerative changes are noted in the right knee with bony spurs. Arterial calcifications are seen in the soft tissues. IMPRESSION: Displaced fracture is seen in the neck of right femur. Degenerative changes are noted in the right knee.  Arteriosclerosis. Electronically Signed   By: Ernie Avena M.D.   On: 10/09/2022 07:46   CT ABDOMEN PELVIS WO CONTRAST  Result Date: 10/03/2022 CLINICAL DATA:  Left lower quadrant abdominal pain EXAM: CT ABDOMEN AND PELVIS WITHOUT CONTRAST TECHNIQUE: Multidetector CT imaging of the abdomen and pelvis was performed following the standard protocol without IV contrast. RADIATION DOSE REDUCTION: This exam was performed according to the departmental dose-optimization program which includes automated exposure control, adjustment of the mA and/or kV according to patient size and/or use of iterative reconstruction technique. COMPARISON:  06/01/2020 FINDINGS: Lower chest: Cardiomegaly without pericardial effusion. Trace right pleural effusion. Hepatobiliary: Cholecystectomy. Unremarkable unenhanced appearance of the liver. Pancreas: Unremarkable unenhanced appearance. Spleen: Unremarkable unenhanced appearance. Adrenals/Urinary Tract: No urinary tract calculi or obstructive uropathy within either kidney. The adrenals and bladder are stable. Stomach/Bowel: No bowel obstruction or ileus. The appendix is not identified. There is diverticulosis of the descending and sigmoid colon, with no evidence of acute diverticulitis. No bowel wall thickening or inflammatory change. Small hiatal hernia. Vascular/Lymphatic: Aortic atherosclerosis. No enlarged abdominal or pelvic lymph nodes. Reproductive: Status post hysterectomy. No adnexal masses. Other: Trace ascites within the lower abdomen and pelvis. No free intraperitoneal gas. No abdominal wall hernia. Musculoskeletal: No acute or destructive bony abnormalities. Reconstructed images  demonstrate no additional findings. IMPRESSION: 1. Cardiomegaly, without pericardial effusion. 2. Trace right pleural effusion. 3. Trace lower abdominal and pelvic ascites. 4. Distal colonic diverticulosis without evidence of acute diverticulitis. 5.  Aortic Atherosclerosis (ICD10-I70.0). Electronically Signed   By: Sharlet Salina M.D.   On: 10/03/2022 21:41   CT Head Wo Contrast  Result Date: 10/03/2022 CLINICAL DATA:  Mental status change, unknown cause EXAM: CT HEAD WITHOUT CONTRAST TECHNIQUE: Contiguous axial images were obtained from the base of the skull through the vertex without intravenous contrast. RADIATION DOSE REDUCTION: This exam was performed according to the departmental dose-optimization program which includes automated exposure control, adjustment of the mA and/or kV according to patient size and/or use of iterative reconstruction technique. COMPARISON:  09/16/19 CT head FINDINGS: Limitations: Motion degraded exam. Brain: No evidence of acute infarction, hemorrhage, hydrocephalus, extra-axial collection or mass lesion/mass effect. Sequela of mild chronic microvascular ischemic change. Vascular: No hyperdense vessel or unexpected calcification. Skull: Normal. Negative for fracture or focal lesion. Sinuses/Orbits: No middle ear or mastoid effusion. Paranasal sinuses are clear. Bilateral lens replacement. Orbits are otherwise unremarkable. Other: None. IMPRESSION: Motion degraded exam. Within this limitation, no acute intracranial abnormality. Electronically Signed   By: Lorenza Cambridge M.D.   On: 10/03/2022 17:54   DG Chest Portable 1 View  Result Date: 10/03/2022 CLINICAL DATA:  Shortness of breath.  Chronic kidney disease. EXAM: PORTABLE CHEST 1 VIEW COMPARISON:  Radiographs 09/06/2022, 09/04/2022 and 05/04/2003. Abdominal CT 06/01/2020. FINDINGS: 1658 hours. Left subclavian pacemaker leads project over the right atrium and right ventricle, unchanged. Stable cardiomegaly and aortic  atherosclerosis. Unchanged small calcified granuloma on the left. No evidence of edema, confluent airspace disease, pleural effusion or pneumothorax. No acute osseous findings are evident. Stable posttraumatic deformity of the proximal left humerus. IMPRESSION: Stable cardiomegaly. No evidence of acute cardiopulmonary process. Electronically Signed   By: Carey Bullocks M.D.   On: 10/03/2022 17:40   CUP PACEART REMOTE DEVICE CHECK  Result Date: 09/17/2022 Scheduled remote reviewed. Normal device function.  Permanent AF, controlled rates, Warfarin per PA report Next remote 91 days. LA, CVRS    Subjective: No acute issues/events overnight   Discharge Exam: Vitals:   10/16/22 0534 10/16/22 0902  BP: (!) 129/55 131/76  Pulse: 61 64  Resp: 18 18  Temp: 97.8 F (36.6 C) 97.7 F (36.5 C)  SpO2: 96% 100%   Vitals:   10/15/22 2208 10/16/22 0500 10/16/22 0534 10/16/22 0902  BP: (!) 125/48  (!) 129/55 131/76  Pulse: 62  61 64  Resp:   18 18  Temp: 97.8 F (36.6 C)  97.8 F (36.6 C) 97.7 F (36.5 C)  TempSrc:   Oral Oral  SpO2: 100%  96% 100%  Weight:  75.9 kg    Height:        General: Mild distress on bedpan HEENT: Profoundly diminished hearing bilaterally Neck:  Without mass or deformity.  Trachea is midline. Lungs:  Clear to auscultate bilaterally without rhonchi, wheeze, or rales. Heart:  Regular rate and rhythm.  Without murmurs, rubs, or gallops. Abdomen:  Soft, nontender, nondistended.  Without guarding or rebound. Extremities: R hip bandage clean/dry/intact Skin:  Warm and dry, no erythema.    The results of significant diagnostics from this hospitalization (including imaging, microbiology, ancillary and laboratory) are listed below for reference.     Microbiology: Recent Results (from the past 240 hour(s))  Surgical pcr screen     Status: Abnormal   Collection Time: 10/11/22  8:21 AM   Specimen: Nasal Mucosa; Nasal Swab  Result Value Ref Range Status   MRSA, PCR  NEGATIVE NEGATIVE Final   Staphylococcus aureus POSITIVE (A) NEGATIVE Final    Comment: (NOTE) The Xpert SA Assay (FDA approved for NASAL specimens in patients 78 years of age and older), is one component of a comprehensive surveillance program. It is not intended to diagnose infection nor to guide or monitor treatment. Performed at [facility], [address], [place], [place] [address]      Labs: BNP (last 3 results) Recent Labs    09/02/22 2001 09/04/22 1114 10/03/22 1712  BNP 436.2* 297.0* 520.0*   Basic Metabolic Panel: Recent Labs  Lab 10/11/22 0504 10/12/22 0331 10/13/22 0712  NA 129* 127* 128*  K 4.5 4.4 4.1  CL 93* 93* 92*  CO2 24 23 24   GLUCOSE 115* 178* 121*  BUN 38* 37* 44*  CREATININE 1.59* 1.49* 1.34*  CALCIUM 8.9 8.6* 8.8*   Liver Function Tests: No results for input(s): "AST", "ALT", "ALKPHOS", "BILITOT", "PROT", "ALBUMIN" in the last 168 hours. No results for input(s): "LIPASE", "AMYLASE" in the last 168 hours. No results for input(s): "AMMONIA" in the last 168 hours. CBC: Recent Labs  Lab 10/11/22 0504 10/12/22 0806 10/13/22 0712  WBC 7.6 7.3 6.9  HGB 11.8* 10.4* 10.3*  HCT  36.9 31.5* 31.5*  MCV 98.1 97.5 97.5  PLT 124* 127* 126*   Cardiac Enzymes: No results for input(s): "CKTOTAL", "CKMB", "CKMBINDEX", "TROPONINI" in the last 168 hours. BNP: Invalid input(s): "POCBNP" CBG: Recent Labs  Lab 10/15/22 1114 10/15/22 1601 10/15/22 1948 10/16/22 0907 10/16/22 1124  GLUCAP 118* 133* 119* 113* 130*   D-Dimer No results for input(s): "DDIMER" in the last 72 hours. Hgb A1c No results for input(s): "HGBA1C" in the last 72 hours. Lipid Profile No results for input(s): "CHOL", "HDL", "LDLCALC", "TRIG", "CHOLHDL", "LDLDIRECT" in the last 72 hours. Thyroid function studies No results for input(s): "TSH", "T4TOTAL", "T3FREE", "THYROIDAB" in the last 72 hours.  Invalid input(s): "FREET3" Anemia work up No results for input(s):  "VITAMINB12", "FOLATE", "FERRITIN", "TIBC", "IRON", "RETICCTPCT" in the last 72 hours. Urinalysis    Component Value Date/Time   COLORURINE YELLOW 10/03/2022 1847   APPEARANCEUR CLEAR 10/03/2022 1847   LABSPEC 1.010 10/03/2022 1847   PHURINE 6.0 10/03/2022 1847   GLUCOSEU NEGATIVE 10/03/2022 1847   HGBUR NEGATIVE 10/03/2022 1847   HGBUR 2+ 12/13/2008 1032   BILIRUBINUR NEGATIVE 10/03/2022 1847   BILIRUBINUR n 12/25/2010 1311   KETONESUR NEGATIVE 10/03/2022 1847   PROTEINUR 30 (A) 10/03/2022 1847   UROBILINOGEN 0.2 12/25/2010 1311   UROBILINOGEN 0.2 12/13/2008 1032   NITRITE NEGATIVE 10/03/2022 1847   LEUKOCYTESUR TRACE (A) 10/03/2022 1847   Sepsis Labs Recent Labs  Lab 10/11/22 0504 10/12/22 0806 10/13/22 0712  WBC 7.6 7.3 6.9   Microbiology Recent Results (from the past 240 hour(s))  Surgical pcr screen     Status: Abnormal   Collection Time: 10/11/22  8:21 AM   Specimen: Nasal Mucosa; Nasal Swab  Result Value Ref Range Status   MRSA, PCR NEGATIVE NEGATIVE Final   Staphylococcus aureus POSITIVE (A) NEGATIVE Final    Comment: (NOTE) The Xpert SA Assay (FDA approved for NASAL specimens in patients 44 years of age and older), is one component of a comprehensive surveillance program. It is not intended to diagnose infection nor to guide or monitor treatment. Performed at [facility], [address], [place], [place] [address]      Time coordinating discharge: Over 30 minutes  SIGNED:   [name], DO [facility] 10/16/2022, 3:52 PM Pager   If 7PM-7AM, please contact night-coverage [email or web address]

## 2022-10-16 NOTE — TOC Transition Note (Signed)
Transition of Care [facility]) - CM/SW Discharge Note   Patient Details  Name: Beverly Kim MRN: [id number] Date of Birth: [birth year]  [facility] CM/SW Contact:  Lorri Frederick, LCSW Phone Number: 10/16/2022, 3:59 PM   Clinical Narrative:   Pt discharging to [facility].  RN call report to [phone].      Final next level of care: Skilled Nursing Facility Barriers to Discharge: Barriers Resolved   Patient Goals and CMS Choice CMS Medicare.gov Compare Post Acute Care list provided to:: Patient Represenative (must comment) (daughter [name]) Choice offered to / list presented to : Adult Children [name])  Discharge Placement                Patient chooses bed at: [facility] Patient to be transferred to facility by: [facility] Name of family member notified: daughter [name] in room Patient and family notified of of transfer: 10/16/22  Discharge Plan and Services Additional resources added to the After Visit Summary for   In-house Referral: Clinical Social Work   Post Acute Care Choice: Skilled Nursing Facility                               Social Determinants of Health (SDOH) Interventions SDOH Screenings   Food Insecurity: No Food Insecurity (09/10/2022)  Housing: Low Risk  (09/03/2022)  Transportation Needs: No Transportation Needs (09/10/2022)  Utilities: Not At Risk (09/03/2022)  Alcohol Screen: Low Risk  (09/11/2019)  Depression (PHQ2-9): Low Risk  (09/11/2019)  Financial Resource Strain: Low Risk  (09/11/2019)  Physical Activity: Sufficiently Active (08/19/2018)  Social Connections: Moderately Integrated (09/11/2019)  Stress: No Stress Concern Present (09/11/2019)  Tobacco Use: Low Risk  (10/11/2022)     Readmission Risk Interventions    09/07/2022    3:18 PM  Readmission Risk Prevention Plan  Post Dischage Appt Complete  Medication Screening Complete  Transportation Screening Complete

## 2022-10-17 DIAGNOSIS — M6281 Muscle weakness (generalized): Secondary | ICD-10-CM | POA: Diagnosis not present

## 2022-10-18 ENCOUNTER — Encounter (INDEPENDENT_AMBULATORY_CARE_PROVIDER_SITE_OTHER): Payer: Self-pay

## 2022-10-18 DIAGNOSIS — I5023 Acute on chronic systolic (congestive) heart failure: Secondary | ICD-10-CM | POA: Diagnosis not present

## 2022-10-18 DIAGNOSIS — R2689 Other abnormalities of gait and mobility: Secondary | ICD-10-CM | POA: Diagnosis not present

## 2022-10-18 DIAGNOSIS — Z9181 History of falling: Secondary | ICD-10-CM | POA: Diagnosis not present

## 2022-10-18 DIAGNOSIS — F05 Delirium due to known physiological condition: Secondary | ICD-10-CM | POA: Diagnosis not present

## 2022-10-18 DIAGNOSIS — M6281 Muscle weakness (generalized): Secondary | ICD-10-CM | POA: Diagnosis not present

## 2022-10-18 DIAGNOSIS — Z4789 Encounter for other orthopedic aftercare: Secondary | ICD-10-CM | POA: Diagnosis not present

## 2022-10-18 DIAGNOSIS — I10 Essential (primary) hypertension: Secondary | ICD-10-CM | POA: Diagnosis not present

## 2022-10-18 DIAGNOSIS — I509 Heart failure, unspecified: Secondary | ICD-10-CM | POA: Diagnosis not present

## 2022-10-18 DIAGNOSIS — S72091D Other fracture of head and neck of right femur, subsequent encounter for closed fracture with routine healing: Secondary | ICD-10-CM | POA: Diagnosis not present

## 2022-10-18 DIAGNOSIS — R2681 Unsteadiness on feet: Secondary | ICD-10-CM | POA: Diagnosis not present

## 2022-10-18 DIAGNOSIS — Z471 Aftercare following joint replacement surgery: Secondary | ICD-10-CM | POA: Diagnosis not present

## 2022-10-19 DIAGNOSIS — N1831 Chronic kidney disease, stage 3a: Secondary | ICD-10-CM | POA: Diagnosis not present

## 2022-10-19 DIAGNOSIS — Z471 Aftercare following joint replacement surgery: Secondary | ICD-10-CM | POA: Diagnosis not present

## 2022-10-19 DIAGNOSIS — I10 Essential (primary) hypertension: Secondary | ICD-10-CM | POA: Diagnosis not present

## 2022-10-19 DIAGNOSIS — I5042 Chronic combined systolic (congestive) and diastolic (congestive) heart failure: Secondary | ICD-10-CM | POA: Diagnosis not present

## 2022-10-20 DIAGNOSIS — I10 Essential (primary) hypertension: Secondary | ICD-10-CM | POA: Diagnosis not present

## 2022-10-22 DIAGNOSIS — R2689 Other abnormalities of gait and mobility: Secondary | ICD-10-CM | POA: Diagnosis not present

## 2022-10-22 DIAGNOSIS — Z4789 Encounter for other orthopedic aftercare: Secondary | ICD-10-CM | POA: Diagnosis not present

## 2022-10-22 DIAGNOSIS — S72091D Other fracture of head and neck of right femur, subsequent encounter for closed fracture with routine healing: Secondary | ICD-10-CM | POA: Diagnosis not present

## 2022-10-22 DIAGNOSIS — R2681 Unsteadiness on feet: Secondary | ICD-10-CM | POA: Diagnosis not present

## 2022-10-22 DIAGNOSIS — N1831 Chronic kidney disease, stage 3a: Secondary | ICD-10-CM | POA: Diagnosis not present

## 2022-10-22 DIAGNOSIS — Z471 Aftercare following joint replacement surgery: Secondary | ICD-10-CM | POA: Diagnosis not present

## 2022-10-22 DIAGNOSIS — I509 Heart failure, unspecified: Secondary | ICD-10-CM | POA: Diagnosis not present

## 2022-10-22 DIAGNOSIS — M6281 Muscle weakness (generalized): Secondary | ICD-10-CM | POA: Diagnosis not present

## 2022-10-22 DIAGNOSIS — Z9181 History of falling: Secondary | ICD-10-CM | POA: Diagnosis not present

## 2022-10-22 DIAGNOSIS — I5042 Chronic combined systolic (congestive) and diastolic (congestive) heart failure: Secondary | ICD-10-CM | POA: Diagnosis not present

## 2022-10-25 DIAGNOSIS — R2681 Unsteadiness on feet: Secondary | ICD-10-CM | POA: Diagnosis not present

## 2022-10-25 DIAGNOSIS — Z4789 Encounter for other orthopedic aftercare: Secondary | ICD-10-CM | POA: Diagnosis not present

## 2022-10-25 DIAGNOSIS — Z9181 History of falling: Secondary | ICD-10-CM | POA: Diagnosis not present

## 2022-10-25 DIAGNOSIS — R2689 Other abnormalities of gait and mobility: Secondary | ICD-10-CM | POA: Diagnosis not present

## 2022-10-25 DIAGNOSIS — M6281 Muscle weakness (generalized): Secondary | ICD-10-CM | POA: Diagnosis not present

## 2022-10-25 DIAGNOSIS — I509 Heart failure, unspecified: Secondary | ICD-10-CM | POA: Diagnosis not present

## 2022-10-25 DIAGNOSIS — S72091D Other fracture of head and neck of right femur, subsequent encounter for closed fracture with routine healing: Secondary | ICD-10-CM | POA: Diagnosis not present

## 2022-10-26 DIAGNOSIS — Z515 Encounter for palliative care: Secondary | ICD-10-CM | POA: Diagnosis not present

## 2022-10-26 DIAGNOSIS — L03119 Cellulitis of unspecified part of limb: Secondary | ICD-10-CM | POA: Diagnosis not present

## 2022-10-29 DIAGNOSIS — S72091D Other fracture of head and neck of right femur, subsequent encounter for closed fracture with routine healing: Secondary | ICD-10-CM | POA: Diagnosis not present

## 2022-10-29 DIAGNOSIS — Z9181 History of falling: Secondary | ICD-10-CM | POA: Diagnosis not present

## 2022-10-29 DIAGNOSIS — Z4789 Encounter for other orthopedic aftercare: Secondary | ICD-10-CM | POA: Diagnosis not present

## 2022-10-29 DIAGNOSIS — I509 Heart failure, unspecified: Secondary | ICD-10-CM | POA: Diagnosis not present

## 2022-10-29 DIAGNOSIS — N1831 Chronic kidney disease, stage 3a: Secondary | ICD-10-CM | POA: Diagnosis not present

## 2022-10-29 DIAGNOSIS — Z471 Aftercare following joint replacement surgery: Secondary | ICD-10-CM | POA: Diagnosis not present

## 2022-10-29 DIAGNOSIS — M6281 Muscle weakness (generalized): Secondary | ICD-10-CM | POA: Diagnosis not present

## 2022-10-29 DIAGNOSIS — L03119 Cellulitis of unspecified part of limb: Secondary | ICD-10-CM | POA: Diagnosis not present

## 2022-10-29 DIAGNOSIS — I5042 Chronic combined systolic (congestive) and diastolic (congestive) heart failure: Secondary | ICD-10-CM | POA: Diagnosis not present

## 2022-10-29 DIAGNOSIS — R2689 Other abnormalities of gait and mobility: Secondary | ICD-10-CM | POA: Diagnosis not present

## 2022-10-29 DIAGNOSIS — R2681 Unsteadiness on feet: Secondary | ICD-10-CM | POA: Diagnosis not present

## 2022-10-31 DIAGNOSIS — S72031D Displaced midcervical fracture of right femur, subsequent encounter for closed fracture with routine healing: Secondary | ICD-10-CM | POA: Diagnosis not present

## 2022-11-01 DIAGNOSIS — S72091D Other fracture of head and neck of right femur, subsequent encounter for closed fracture with routine healing: Secondary | ICD-10-CM | POA: Diagnosis not present

## 2022-11-01 DIAGNOSIS — R2689 Other abnormalities of gait and mobility: Secondary | ICD-10-CM | POA: Diagnosis not present

## 2022-11-01 DIAGNOSIS — Z515 Encounter for palliative care: Secondary | ICD-10-CM | POA: Diagnosis not present

## 2022-11-01 DIAGNOSIS — R2681 Unsteadiness on feet: Secondary | ICD-10-CM | POA: Diagnosis not present

## 2022-11-01 DIAGNOSIS — M6281 Muscle weakness (generalized): Secondary | ICD-10-CM | POA: Diagnosis not present

## 2022-11-01 DIAGNOSIS — I509 Heart failure, unspecified: Secondary | ICD-10-CM | POA: Diagnosis not present

## 2022-11-01 DIAGNOSIS — Z4789 Encounter for other orthopedic aftercare: Secondary | ICD-10-CM | POA: Diagnosis not present

## 2022-11-01 DIAGNOSIS — Z9181 History of falling: Secondary | ICD-10-CM | POA: Diagnosis not present

## 2022-11-02 DIAGNOSIS — D649 Anemia, unspecified: Secondary | ICD-10-CM | POA: Diagnosis not present

## 2022-11-02 DIAGNOSIS — Z79899 Other long term (current) drug therapy: Secondary | ICD-10-CM | POA: Diagnosis not present

## 2022-11-05 DIAGNOSIS — N1831 Chronic kidney disease, stage 3a: Secondary | ICD-10-CM | POA: Diagnosis not present

## 2022-11-05 DIAGNOSIS — M6281 Muscle weakness (generalized): Secondary | ICD-10-CM | POA: Diagnosis not present

## 2022-11-05 DIAGNOSIS — R300 Dysuria: Secondary | ICD-10-CM | POA: Diagnosis not present

## 2022-11-06 DIAGNOSIS — N390 Urinary tract infection, site not specified: Secondary | ICD-10-CM | POA: Diagnosis not present

## 2022-11-07 DIAGNOSIS — I10 Essential (primary) hypertension: Secondary | ICD-10-CM | POA: Diagnosis not present

## 2022-11-08 DIAGNOSIS — Z4789 Encounter for other orthopedic aftercare: Secondary | ICD-10-CM | POA: Diagnosis not present

## 2022-11-08 DIAGNOSIS — S72091D Other fracture of head and neck of right femur, subsequent encounter for closed fracture with routine healing: Secondary | ICD-10-CM | POA: Diagnosis not present

## 2022-11-08 DIAGNOSIS — Z9181 History of falling: Secondary | ICD-10-CM | POA: Diagnosis not present

## 2022-11-08 DIAGNOSIS — R2681 Unsteadiness on feet: Secondary | ICD-10-CM | POA: Diagnosis not present

## 2022-11-08 DIAGNOSIS — M6281 Muscle weakness (generalized): Secondary | ICD-10-CM | POA: Diagnosis not present

## 2022-11-08 DIAGNOSIS — R2689 Other abnormalities of gait and mobility: Secondary | ICD-10-CM | POA: Diagnosis not present

## 2022-11-08 DIAGNOSIS — I509 Heart failure, unspecified: Secondary | ICD-10-CM | POA: Diagnosis not present

## 2022-11-09 DIAGNOSIS — I10 Essential (primary) hypertension: Secondary | ICD-10-CM | POA: Diagnosis not present

## 2022-11-12 DIAGNOSIS — M6281 Muscle weakness (generalized): Secondary | ICD-10-CM | POA: Diagnosis not present

## 2022-11-12 DIAGNOSIS — Z4789 Encounter for other orthopedic aftercare: Secondary | ICD-10-CM | POA: Diagnosis not present

## 2022-11-12 DIAGNOSIS — R2681 Unsteadiness on feet: Secondary | ICD-10-CM | POA: Diagnosis not present

## 2022-11-12 DIAGNOSIS — E875 Hyperkalemia: Secondary | ICD-10-CM | POA: Diagnosis not present

## 2022-11-12 DIAGNOSIS — R2689 Other abnormalities of gait and mobility: Secondary | ICD-10-CM | POA: Diagnosis not present

## 2022-11-12 DIAGNOSIS — Z9181 History of falling: Secondary | ICD-10-CM | POA: Diagnosis not present

## 2022-11-12 DIAGNOSIS — I5042 Chronic combined systolic (congestive) and diastolic (congestive) heart failure: Secondary | ICD-10-CM | POA: Diagnosis not present

## 2022-11-12 DIAGNOSIS — N1831 Chronic kidney disease, stage 3a: Secondary | ICD-10-CM | POA: Diagnosis not present

## 2022-11-12 DIAGNOSIS — S72091D Other fracture of head and neck of right femur, subsequent encounter for closed fracture with routine healing: Secondary | ICD-10-CM | POA: Diagnosis not present

## 2022-11-12 DIAGNOSIS — I509 Heart failure, unspecified: Secondary | ICD-10-CM | POA: Diagnosis not present

## 2022-11-12 DIAGNOSIS — N179 Acute kidney failure, unspecified: Secondary | ICD-10-CM | POA: Diagnosis not present

## 2022-11-13 ENCOUNTER — Other Ambulatory Visit: Payer: Self-pay

## 2022-11-13 ENCOUNTER — Emergency Department (HOSPITAL_COMMUNITY)
Admission: EM | Admit: 2022-11-13 | Discharge: 2022-11-13 | Disposition: A | Discharge status: Home / Self Care | Payer: PPO | Attending: Emergency Medicine | Admitting: Emergency Medicine

## 2022-11-13 ENCOUNTER — Ambulatory Visit: Payer: PPO | Admitting: Internal Medicine

## 2022-11-13 ENCOUNTER — Encounter (HOSPITAL_COMMUNITY): Payer: Self-pay

## 2022-11-13 ENCOUNTER — Emergency Department (HOSPITAL_COMMUNITY): Payer: PPO

## 2022-11-13 DIAGNOSIS — Z950 Presence of cardiac pacemaker: Secondary | ICD-10-CM | POA: Diagnosis not present

## 2022-11-13 DIAGNOSIS — R918 Other nonspecific abnormal finding of lung field: Secondary | ICD-10-CM | POA: Diagnosis not present

## 2022-11-13 DIAGNOSIS — I129 Hypertensive chronic kidney disease with stage 1 through stage 4 chronic kidney disease, or unspecified chronic kidney disease: Secondary | ICD-10-CM | POA: Diagnosis not present

## 2022-11-13 DIAGNOSIS — N189 Chronic kidney disease, unspecified: Secondary | ICD-10-CM | POA: Diagnosis not present

## 2022-11-13 DIAGNOSIS — E8770 Fluid overload, unspecified: Secondary | ICD-10-CM | POA: Diagnosis not present

## 2022-11-13 DIAGNOSIS — I110 Hypertensive heart disease with heart failure: Secondary | ICD-10-CM | POA: Diagnosis not present

## 2022-11-13 DIAGNOSIS — R0602 Shortness of breath: Secondary | ICD-10-CM

## 2022-11-13 DIAGNOSIS — Z20822 Contact with and (suspected) exposure to covid-19: Secondary | ICD-10-CM | POA: Diagnosis not present

## 2022-11-13 DIAGNOSIS — G8929 Other chronic pain: Secondary | ICD-10-CM

## 2022-11-13 DIAGNOSIS — I517 Cardiomegaly: Secondary | ICD-10-CM | POA: Diagnosis not present

## 2022-11-13 DIAGNOSIS — Z91040 Latex allergy status: Secondary | ICD-10-CM | POA: Diagnosis not present

## 2022-11-13 DIAGNOSIS — I509 Heart failure, unspecified: Secondary | ICD-10-CM

## 2022-11-13 LAB — TROPONIN I (HIGH SENSITIVITY)
Troponin I (High Sensitivity): 19 ng/L — ABNORMAL HIGH (ref <18)
Troponin I (High Sensitivity): 19 ng/L — ABNORMAL HIGH

## 2022-11-13 LAB — COMPREHENSIVE METABOLIC PANEL
ALT: 10 U/L (ref 0–44)
AST: 22 U/L (ref 15–41)
Albumin: 2.9 g/dL — ABNORMAL LOW (ref 3.5–5.0)
Alkaline Phosphatase: 87 U/L (ref 38–126)
Anion gap: 12 (ref 5–15)
BUN: 19 mg/dL (ref 8–23)
CO2: 19 mmol/L — ABNORMAL LOW (ref 22–32)
Calcium: 9 mg/dL (ref 8.9–10.3)
Chloride: 100 mmol/L (ref 98–111)
Creatinine, Ser: 1.07 mg/dL — ABNORMAL HIGH (ref 0.44–1.00)
GFR, Estimated: 48 mL/min — ABNORMAL LOW (ref >60)
Glucose, Bld: 95 mg/dL (ref 70–99)
Potassium: 4.6 mmol/L (ref 3.5–5.1)
Sodium: 131 mmol/L — ABNORMAL LOW (ref 135–145)
Total Bilirubin: 0.6 mg/dL (ref 0.3–1.2)
Total Protein: 6.2 g/dL — ABNORMAL LOW (ref 6.5–8.1)

## 2022-11-13 LAB — RESP PANEL BY RT-PCR (RSV, FLU A&B, COVID)  RVPGX2
Influenza A by PCR: NEGATIVE
Influenza B by PCR: NEGATIVE
Resp Syncytial Virus by PCR: NEGATIVE
SARS Coronavirus 2 by RT PCR: NEGATIVE

## 2022-11-13 LAB — CBC
HCT: 35.9 % — ABNORMAL LOW (ref 36.0–46.0)
Hemoglobin: 11.5 g/dL — ABNORMAL LOW (ref 12.0–15.0)
MCH: 30.7 pg (ref 26.0–34.0)
MCHC: 32 g/dL (ref 30.0–36.0)
MCV: 96 fL (ref 80.0–100.0)
Platelets: 153 K/uL (ref 150–400)
RBC: 3.74 MIL/uL — ABNORMAL LOW (ref 3.87–5.11)
RDW: 16.5 % — ABNORMAL HIGH (ref 11.5–15.5)
WBC: 7.2 K/uL (ref 4.0–10.5)
nRBC: 0 % (ref 0.0–0.2)

## 2022-11-13 LAB — BRAIN NATRIURETIC PEPTIDE: B Natriuretic Peptide: 1168.1 pg/mL — ABNORMAL HIGH (ref 0.0–100.0)

## 2022-11-13 NOTE — ED Notes (Signed)
CMP redraw sent.

## 2022-11-13 NOTE — ED Notes (Addendum)
Lab called about the 3rd light green tube sent down at 1436 that is not being processed.

## 2022-11-13 NOTE — ED Notes (Signed)
Called lab to check on repeat CMP, still not in process. Sent down at 1326.

## 2022-11-13 NOTE — Discharge Instructions (Addendum)
Please follow-up with your primary care physician for recheck, additionally follow-up with your cardiologist for continued management of your heart failure.  You had mild shortness of breath but were overall clinically stable.  You had no sign of emergent need for inpatient hospitalization.  This was offered given your shortness of breath with a history of heart failure.  You had a mildly elevated BNP which can check for cardiac stretch in the setting of fluid overload compared to your baseline but you otherwise had no evidence of fluid on your lungs and your vitals were stable.  For now, recommend that you double your home dose of Lasix to 80 mg daily for the next 3 days.  Return to the emergency department for any severe worsening symptoms.

## 2022-11-13 NOTE — ED Provider Notes (Signed)
[place] EMERGENCY DEPARTMENT AT [facility] Provider Note   CSN: [id number] Arrival date & time: 11/13/22  1209     History  Chief Complaint  Patient presents with   Arm Injury   Shortness of Breath        Congestive Heart Failure    Beverly Kim is a 89 y.o. female.  The history is provided by the patient and medical records. No language interpreter was used.  Shortness of Breath Severity:  Moderate Onset quality:  Gradual Duration:  3 days Timing:  Constant Progression:  Waxing and waning Chronicity:  New Context: not URI   Relieved by:  Nothing Worsened by:  Nothing Ineffective treatments:  None tried Associated symptoms: cough   Associated symptoms: no abdominal pain, no chest pain, no fever, no headaches, no neck pain, no rash, no vomiting and no wheezing   Risk factors: no hx of PE/DVT   Congestive Heart Failure This is a new problem. The current episode started more than 2 days ago. The problem occurs constantly. The problem has been gradually worsening. Associated symptoms include shortness of breath. Pertinent negatives include no chest pain, no abdominal pain and no headaches. Nothing aggravates the symptoms. Nothing relieves the symptoms. She has tried nothing for the symptoms. The treatment provided no relief.       Home Medications Prior to Admission medications   Medication Sig Start Date End Date Taking? Authorizing Provider  bisoprolol (ZEBETA) 5 MG tablet Take 0.5 tablets (2.5 mg total) by mouth daily. 02/14/22  Yes Marykay Lex, MD  diclofenac Sodium (VOLTAREN) 1 % GEL Apply 4 g topically 4 (four) times daily. BLE   Yes [provider]  lidocaine (LIDODERM) 5 % Place 1 patch onto the skin daily. Apply to affected area as directed. Remove & Discard patch within 12 hours. Alternate sites as possible. 10/17/22  Yes [name], MD  metolazone (ZAROXOLYN) 5 MG tablet Take 5 mg by mouth daily.   Yes [provider]  Multiple Vitamin (MULTIVITAMIN WITH MINERALS) TABS tablet Take 1 tablet by mouth daily. 10/17/22 01/15/23 Yes Montez Morita, PA-C  pantoprazole (PROTONIX) 40 MG tablet TAKE 1 TABLET BY MOUTH EVERY DAY 06/04/22  Yes Quentin Mulling R, PA-C  Sennosides (SENOKOT EXTRA STRENGTH) 17.2 MG TABS Take 34.4 mg by mouth daily.   Yes [provider]  spironolactone (ALDACTONE) 25 MG tablet Take 25 mg by mouth daily.   Yes [provider]  traZODone (DESYREL) 50 MG tablet TAKE 1/2 TO 1 TABLET (25 TO 50 MG TOTAL) BY MOUTH AT BEDTIME Patient taking differently: Take 25 mg by mouth at bedtime. 10/05/22  Yes Burns, Bobette Mo, MD  albuterol (VENTOLIN HFA) 108 (90 Base) MCG/ACT inhaler Inhale 1-2 puffs into the lungs every 6 (six) hours as needed for wheezing or shortness of breath. 05/31/22   Burns, Bobette Mo, MD  docusate sodium (COLACE) 100 MG capsule Take 1 capsule (100 mg total) by mouth 2 (two) times daily. 10/16/22   [name], MD  enoxaparin (LOVENOX) 30 MG/0.3ML injection Inject 0.3 mLs (30 mg total) into the skin daily for 21 days. Patient not taking: Reported on 11/13/2022 10/17/22 11/07/22  Montez Morita, PA-C  feeding supplement (ENSURE ENLIVE / ENSURE PLUS) LIQD Take 237 mLs by mouth 2 (two) times daily between meals. 10/16/22   Montez Morita, PA-C  furosemide (LASIX) 40 MG tablet TAKE 1 TABLET BY MOUTH EVERY DAY Patient not taking: Reported on 11/13/2022 04/30/22  Marykay Lex, MD  nystatin (MYCOSTATIN/NYSTOP) powder Apply topically 3 (three) times daily as needed. To affected skin folds (under breast and abdomen) 10/16/22   [name], MD  ondansetron (ZOFRAN) 4 MG tablet Take 1 tablet (4 mg total) by mouth every 6 (six) hours as needed for nausea. 10/16/22   [name], MD  polyethylene glycol (MIRALAX / GLYCOLAX) 17 g packet Take 17 g by mouth 2 (two) times daily. 10/16/22   [name], MD  potassium chloride (KLOR-CON) 10 MEQ tablet TAKE 1 TABLET  ( 10 MEQ total) BY MOUTH ONCE DAILY ON  DAYS  METOLAZONE  IS  TAKEN Patient taking differently: Take 10 mEq by mouth See admin instructions. Take 1 tablet by mouth every Monday, Wednesday, Saturday 10/25/21   Marykay Lex, MD  promethazine (PHENERGAN) 12.5 MG tablet Take 0.5 tablets (6.25 mg total) by mouth every 6 (six) hours as needed for nausea or vomiting. 09/17/22   Verlon Setting, Whitney L, PA  SYNTHROID 112 MCG tablet Take 1 tablet (112 mcg total) by mouth daily before breakfast. 05/09/22   Pincus Sanes, MD  torsemide (DEMADEX) 20 MG tablet Take 60 mg by mouth daily. Patient not taking: Reported on 11/13/2022    [provider]  traMADol (ULTRAM) 50 MG tablet Take 1 tablet (50 mg total) by mouth every 6 (six) hours as needed for severe pain. 10/16/22   Montez Morita, PA-C      Allergies    Adhesive [tape], Ace inhibitors, Atorvastatin, Clindamycin, Codeine, Latex, Shellfish allergy, Simvastatin, and Penicillins    Review of Systems   Review of Systems  Constitutional:  Positive for fatigue. Negative for chills and fever.  HENT:  Negative for congestion.   Respiratory:  Positive for cough and shortness of breath. Negative for chest tightness and wheezing.   Cardiovascular:  Positive for leg swelling. Negative for chest pain.  Gastrointestinal:  Negative for abdominal pain, constipation, diarrhea, nausea and vomiting.  Genitourinary:  Negative for dysuria and flank pain.  Musculoskeletal:  Negative for back pain and neck pain.  Skin:  Negative for rash and wound.  Neurological:  Negative for light-headedness and headaches.  Psychiatric/Behavioral:  Negative for agitation and confusion.   All other systems reviewed and are negative.   Physical Exam Updated Vital Signs BP (!) 136/97   Pulse 65   Temp 98.1 F (36.7 C)   Resp 18   Ht 5\' 2"  (1.575 m)   Wt 76 kg   SpO2 100%   BMI 30.65 kg/m  Physical Exam Vitals and nursing note reviewed.  Constitutional:      General: She is  not in acute distress.    Appearance: She is well-developed. She is not ill-appearing, toxic-appearing or diaphoretic.  HENT:     Head: Normocephalic and atraumatic.  Eyes:     Conjunctiva/sclera: Conjunctivae normal.     Pupils: Pupils are equal, round, and reactive to light.  Cardiovascular:     Rate and Rhythm: Normal rate and regular rhythm.     Heart sounds: Murmur heard.  Pulmonary:     Effort: Pulmonary effort is normal. Tachypnea present. No respiratory distress.     Breath sounds: Rhonchi and rales present.  Chest:     Chest wall: No tenderness.  Abdominal:     Palpations: Abdomen is soft.     Tenderness: There is no abdominal tenderness.  Musculoskeletal:        General: No swelling.     Cervical  back: Neck supple.     Right lower leg: No tenderness. Edema present.     Left lower leg: No tenderness. Edema present.  Skin:    General: Skin is warm and dry.     Capillary Refill: Capillary refill takes less than 2 seconds.     Findings: No erythema.     Nails: There is no clubbing.  Neurological:     General: No focal deficit present.     Mental Status: She is alert.  Psychiatric:        Mood and Affect: Mood normal.     ED Results / Procedures / Treatments   Labs (all labs ordered are listed, but only abnormal results are displayed) Labs Reviewed  CBC - Abnormal; Notable for the following components:      Result Value   RBC 3.74 (*)    Hemoglobin 11.5 (*)    HCT 35.9 (*)    RDW 16.5 (*)    All other components within normal limits  BRAIN NATRIURETIC PEPTIDE - Abnormal; Notable for the following components:   B Natriuretic Peptide 1,168.1 (*)    All other components within normal limits  RESP PANEL BY RT-PCR (RSV, FLU A&B, COVID)  [id number]  COMPREHENSIVE METABOLIC PANEL  TROPONIN I (HIGH SENSITIVITY)    EKG EKG Interpretation Date/Time:  Tuesday November 13 2022 12:27:47 EDT Ventricular Rate:  66 PR Interval:  45 QRS Duration:  164 QT  Interval:  456 QTC Calculation: 478 R Axis:   -87  Text Interpretation: Sinus rhythm Ventricular premature complex Short PR interval Nonspecific IVCD with LAD LVH with secondary repolarization abnormality Anterolateral infarct, age indeterminate Artifact in lead(s) I III aVR aVL aVF similar paced to prior No STEMI Confirmed by Theda Belfast ([id number]) on 11/13/2022 1:05:00 PM  Radiology No results found.  Procedures Procedures    Medications Ordered in ED Medications - No data to display  ED Course/ Medical Decision Making/ A&P                                 Medical Decision Making Amount and/or Complexity of Data Reviewed Labs: ordered. Radiology: ordered.    SANDA MAKINSON is a 89 y.o. female with a past medical history significant for hypertension, CHF, previous complete heart block with pacemaker, paroxysmal atrial fibrillation, GERD, and CKD who presents for worsening shortness of breath and cough.  According to patient, for the last few days she has felt like she is getting smothered and cannot breathe.  She reports she cannot lay flat.  She has had some cough and some chills but denies fevers.  She is coughing up some phlegm and feels like she cannot catch her breath.  She is denying any chest discomfort or back discomfort.  She reports she has swelling in her legs that seems similar.  EMS was reportedly concerned about heart failure exacerbation.  She reports chronic swelling and pain in her left elbow and reports this is not any different.  Denies any trauma to it.  She denies any abdominal pain back or flank pain at this time.  On exam, lungs have rales and rhonchi bilaterally.  Chest is nontender and she has not hypoxic on room air initially.  Abdomen nontender.  Good pulses extremities but she has edema in both legs.  EKG did not show STEMI and appeared similar to prior.         Will get x-ray,  labs, COVID swab.  Her BNP is 1100 which is much higher than previous.  Suspect  heart failure exacerbation causing the smothering and difficulty breathing and increased work of breathing.  Will wait for labs, troponin, and her COVID test and x-ray but she will need admission for safe diuresis.          Final Clinical Impression(s) / ED Diagnoses Final diagnoses:  Hypervolemia, unspecified hypervolemia type  Shortness of breath    Clinical Impression: 1. Hypervolemia, unspecified hypervolemia type   2. Shortness of breath     Disposition: Admit  This note was prepared with assistance of Dragon voice recognition software. Occasional wrong-word or sound-a-like substitutions may have occurred due to the inherent limitations of voice recognition software.      Wilburta Milbourn, Canary Brim, MD 11/13/22 2059

## 2022-11-13 NOTE — ED Notes (Signed)
Ptar called 

## 2022-11-13 NOTE — ED Triage Notes (Signed)
Patient bib [facility] from [facility] with the main complaint of Left elbow pain, EMS reports significant swelling and and it is very painful. Also she has some complaints of shortness of breath that has increased over the last couple of days. EMS states that she has a history of CHF and hs crackles in the lower right lung. Patient Alert and oriented x4 on arrival to ED.

## 2022-11-13 NOTE — ED Provider Notes (Signed)
  Physical Exam  BP (!) 148/80   Pulse 68   Temp 98.1 F (36.7 C)   Resp 19   Ht 5\' 2"  (1.575 m)   Wt 76 kg   SpO2 100%   BMI 30.65 kg/m   Physical Exam  Procedures  Procedures  ED Course / MDM    Medical Decision Making Amount and/or Complexity of Data Reviewed Labs: ordered. Radiology: ordered.   41F, presenting with CHF exacerbation, waiting on labs for admission.   On my evaluation, the patient was resting comfortably in no distress.  She was saturating at 100% on room air, not tachycardic or tachypneic.  Her chest x-ray reveals chronic interstitial changes with no evidence of pulmonary edema.  She has an elevated BNP compared to her baseline but shows no other significant signs of volume overload.  No clear indication for hospital admission at this time.  I considered hospital admission given the patient's age and history of heart failure with mild shortness of breath and an elevated BNP however the patient adamantly refused hospitalization.  She was recently hospitalized 1 month ago and had a prolonged course of hospital acquired delirium with associated fall and fracture and does not want to be back in the hospital.  She is currently at a skilled nursing facility, [facility].  The initially sent.  Not for shortness of breath but for left elbow pain.  I called the patient's daughter Wendall Stade at [phone] who states that the patient has had chronic pain in her left arm with chronic swelling from a fall several years ago.  No recent falls or trauma, no erythema or warmth, intact range of motion, low concern for septic arthritis, no evidence for bursitis or cellulitis.  The patient likely is having chronic swelling and pain from her old fracture.  Do not think repeat imaging is warranted at this time.  Regarding the patient's heart failure, I recommended that she double her Lasix dose for the next 3 days and follow closely with a primary care physician for a recheck, return  precautions provided.  The patient still makes her own medical decisions and this was confirmed by her daughter.  The patient and family were updated regarding the plan of care and were in agreement.         Ernie Avena, MD 11/13/22 [phone]

## 2022-11-13 NOTE — ED Notes (Signed)
3rd CMP sent to lab.

## 2022-11-14 DIAGNOSIS — I5042 Chronic combined systolic (congestive) and diastolic (congestive) heart failure: Secondary | ICD-10-CM | POA: Diagnosis not present

## 2022-11-15 DIAGNOSIS — I509 Heart failure, unspecified: Secondary | ICD-10-CM | POA: Diagnosis not present

## 2022-11-15 DIAGNOSIS — Z515 Encounter for palliative care: Secondary | ICD-10-CM | POA: Diagnosis not present

## 2022-11-15 DIAGNOSIS — Z4789 Encounter for other orthopedic aftercare: Secondary | ICD-10-CM | POA: Diagnosis not present

## 2022-11-15 DIAGNOSIS — R2689 Other abnormalities of gait and mobility: Secondary | ICD-10-CM | POA: Diagnosis not present

## 2022-11-15 DIAGNOSIS — Z9181 History of falling: Secondary | ICD-10-CM | POA: Diagnosis not present

## 2022-11-15 DIAGNOSIS — R2681 Unsteadiness on feet: Secondary | ICD-10-CM | POA: Diagnosis not present

## 2022-11-15 DIAGNOSIS — S72091D Other fracture of head and neck of right femur, subsequent encounter for closed fracture with routine healing: Secondary | ICD-10-CM | POA: Diagnosis not present

## 2022-11-15 DIAGNOSIS — M6281 Muscle weakness (generalized): Secondary | ICD-10-CM | POA: Diagnosis not present

## 2022-11-16 DIAGNOSIS — I10 Essential (primary) hypertension: Secondary | ICD-10-CM | POA: Diagnosis not present

## 2022-11-16 DIAGNOSIS — K219 Gastro-esophageal reflux disease without esophagitis: Secondary | ICD-10-CM | POA: Diagnosis not present

## 2022-11-16 DIAGNOSIS — Z471 Aftercare following joint replacement surgery: Secondary | ICD-10-CM | POA: Diagnosis not present

## 2022-11-16 DIAGNOSIS — I5042 Chronic combined systolic (congestive) and diastolic (congestive) heart failure: Secondary | ICD-10-CM | POA: Diagnosis not present

## 2022-11-17 DIAGNOSIS — I10 Essential (primary) hypertension: Secondary | ICD-10-CM | POA: Diagnosis not present

## 2022-11-19 ENCOUNTER — Telehealth: Payer: Self-pay | Admitting: Internal Medicine

## 2022-11-19 NOTE — Telephone Encounter (Signed)
Pt daughter called stating the just got from rehab and she's in her care now. She's concern about a several situations but pt is bed bound she's not sure if she can be seen in person.. Pt is having yeast infection and uti symptoms with constipation which is causing swelling on her side. Can you please reach out to patient to see if can get a home health nurse to check on her or a televisit with her primary if possible.. For the time being she was also wondering if its anything she can get something over the counter for these issues. Something that will not interfere with her current meds if possible.  Thanks

## 2022-11-19 NOTE — Telephone Encounter (Signed)
Okay to set up virtual visit at some point so we can do home nursing if she needs it.  Ideally would like to have a UA, urine culture to confirm infection.  She should have some nystatin powder at home that she can use for the yeast infection-I can send something in if she needs it.  We can also try a cream if that would be easier-let me know and I can send it into what ever pharmacy she wishes for the yeast infection.

## 2022-11-20 ENCOUNTER — Telehealth: Payer: Self-pay | Admitting: *Deleted

## 2022-11-20 ENCOUNTER — Other Ambulatory Visit: Payer: Self-pay

## 2022-11-20 DIAGNOSIS — R300 Dysuria: Secondary | ICD-10-CM

## 2022-11-20 MED ORDER — NYSTATIN 100000 UNIT/GM EX POWD: Freq: Three times a day (TID) | CUTANEOUS | 0 refills | Status: DC | PRN

## 2022-11-20 MED ORDER — CLOTRIMAZOLE 1 % EX CREA: 1.0000 | TOPICAL_CREAM | Freq: Two times a day (BID) | CUTANEOUS | 3 refills | Status: DC

## 2022-11-20 NOTE — Progress Notes (Signed)
Care Coordination Note  11/20/2022 Name: Beverly Kim MRN: [id number] DOB: [birth year]  Beverly Kim is a 89 y.o. year old female who is a primary care patient of Lawerance Bach, Bobette Mo, MD and is actively engaged with the care management team. I reached out to [name] by phone today to assist with re-scheduling a follow up visit with the RN Case Manager  Follow up plan: Unsuccessful telephone outreach attempt made. A HIPAA compliant phone message was left for the patient providing contact information and requesting a return call.   Burman Nieves, CCMA Care Coordination Care Guide Direct Dial: [phone]

## 2022-11-21 ENCOUNTER — Encounter: Payer: Self-pay | Admitting: Internal Medicine

## 2022-11-21 NOTE — Progress Notes (Unsigned)
Virtual Visit via Video Note  I connected with Beverly Kim on 11/21/22 at 11:00 AM EDT by a video enabled telemedicine application and verified that I am speaking with the correct person using two identifiers.   I discussed the limitations of evaluation and management by telemedicine and the availability of in person appointments. The patient expressed understanding and agreed to proceed.  Present for the visit:  Myself, Dr Cheryll Cockayne, Lita Mains and her granddaughter.  The patient is currently at home and I am in the office.    No referring provider.    History of Present Illness: She is here for follow up of rehab.   Admitted 8/6-/13 after fall at home - fx right hip, s/p hemiarthroplasty.  Had delirium ( toxic encephalopathy vs polypharmacy. D/C'd to SNF  ED 9/10 for CHF exacerbation.  BNP elevated. Advised to double lasix x 3 days.  She had some SOB. Marland Kitchen  Refused admission.    Currently at home.  Had complaints of possible UTI - will try to bring a urine in to be tested.  Had skin yeast infection of lower abdomen.   Needs home health referral.     ROS     Social History   Socioeconomic History   Marital status: Widowed    Spouse name: Not on file   Number of children: 4   Years of education: Not on file   Highest education level: Not on file  Occupational History   Occupation: retired  Tobacco Use   Smoking status: Never   Smokeless tobacco: Never  Vaping Use   Vaping status: Never Used  Substance and Sexual Activity   Alcohol use: No   Drug use: No   Sexual activity: Never  Other Topics Concern   Not on file  Social History Narrative   Not on file   Social Determinants of Health   Financial Resource Strain: Low Risk  (09/11/2019)   Overall Financial Resource Strain (CARDIA)    Difficulty of Paying Living Expenses: Not hard at all  Food Insecurity: No Food Insecurity (09/10/2022)   Hunger Vital Sign    Worried About Running Out of Food in the Last Year:  Never true    Ran Out of Food in the Last Year: Never true  Transportation Needs: No Transportation Needs (09/10/2022)   PRAPARE - Administrator, Civil Service (Medical): No    Lack of Transportation (Non-Medical): No  Physical Activity: Sufficiently Active (08/19/2018)   Exercise Vital Sign    Days of Exercise per Week: 5 days    Minutes of Exercise per Session: 40 min  Stress: No Stress Concern Present (09/11/2019)   [employer] of Occupational Health - Occupational Stress Questionnaire    Feeling of Stress : Not at all  Social Connections: Moderately Integrated (09/11/2019)   Social Connection and Isolation Panel [NHANES]    Frequency of Communication with Friends and Family: More than three times a week    Frequency of Social Gatherings with Friends and Family: More than three times a week    Attends Religious Services: More than 4 times per year    Active Member of [employer] or Organizations: Yes    Attends Banker Meetings: More than 4 times per year    Marital Status: Widowed     Observations/Objective: Appears well in NAD   Assessment and Plan:  See Problem List for Assessment and Plan of chronic medical problems.   Follow Up Instructions:  I discussed the assessment and treatment plan with the patient. The patient was provided an opportunity to ask questions and all were answered. The patient agreed with the plan and demonstrated an understanding of the instructions.   The patient was advised to call back or seek an in-person evaluation if the symptoms worsen or if the condition fails to improve as anticipated.    Pincus Sanes, MD

## 2022-11-22 ENCOUNTER — Telehealth: Payer: Self-pay | Admitting: Internal Medicine

## 2022-11-22 ENCOUNTER — Telehealth (INDEPENDENT_AMBULATORY_CARE_PROVIDER_SITE_OTHER): Payer: PPO | Admitting: Internal Medicine

## 2022-11-22 DIAGNOSIS — S72141A Displaced intertrochanteric fracture of right femur, initial encounter for closed fracture: Secondary | ICD-10-CM

## 2022-11-22 DIAGNOSIS — G4709 Other insomnia: Secondary | ICD-10-CM

## 2022-11-22 DIAGNOSIS — I10 Essential (primary) hypertension: Secondary | ICD-10-CM

## 2022-11-22 DIAGNOSIS — E038 Other specified hypothyroidism: Secondary | ICD-10-CM

## 2022-11-22 DIAGNOSIS — I5042 Chronic combined systolic (congestive) and diastolic (congestive) heart failure: Secondary | ICD-10-CM | POA: Diagnosis not present

## 2022-11-22 DIAGNOSIS — N183 Chronic kidney disease, stage 3 unspecified: Secondary | ICD-10-CM

## 2022-11-22 DIAGNOSIS — R1011 Right upper quadrant pain: Secondary | ICD-10-CM

## 2022-11-22 DIAGNOSIS — N1831 Chronic kidney disease, stage 3a: Secondary | ICD-10-CM | POA: Diagnosis not present

## 2022-11-22 DIAGNOSIS — E1122 Type 2 diabetes mellitus with diabetic chronic kidney disease: Secondary | ICD-10-CM | POA: Diagnosis not present

## 2022-11-22 DIAGNOSIS — I480 Paroxysmal atrial fibrillation: Secondary | ICD-10-CM

## 2022-11-22 DIAGNOSIS — K219 Gastro-esophageal reflux disease without esophagitis: Secondary | ICD-10-CM | POA: Diagnosis not present

## 2022-11-22 DIAGNOSIS — R5381 Other malaise: Secondary | ICD-10-CM | POA: Diagnosis not present

## 2022-11-22 DIAGNOSIS — I2510 Atherosclerotic heart disease of native coronary artery without angina pectoris: Secondary | ICD-10-CM

## 2022-11-22 DIAGNOSIS — R2689 Other abnormalities of gait and mobility: Secondary | ICD-10-CM | POA: Diagnosis not present

## 2022-11-22 MED ORDER — PANTOPRAZOLE SODIUM 40 MG PO TBEC
40.0000 mg | DELAYED_RELEASE_TABLET | Freq: Every day | ORAL | 1 refills | Status: DC
Start: 2022-11-22 — End: 2023-03-11

## 2022-11-22 MED ORDER — PREGABALIN 25 MG PO CAPS: 25.0000 mg | ORAL_CAPSULE | Freq: Two times a day (BID) | ORAL | 0 refills | Status: DC

## 2022-11-22 MED ORDER — TRAZODONE HCL 50 MG PO TABS: 25.0000 mg | ORAL_TABLET | Freq: Every day | ORAL | 3 refills | Status: DC

## 2022-11-22 MED ORDER — SYNTHROID 112 MCG PO TABS: 112.0000 ug | ORAL_TABLET | Freq: Every day | ORAL | 3 refills | Status: DC

## 2022-11-22 MED ORDER — TORSEMIDE 20 MG PO TABS: 60.0000 mg | ORAL_TABLET | Freq: Every day | ORAL | 1 refills | Status: DC

## 2022-11-22 MED ORDER — DOCUSATE SODIUM 100 MG PO CAPS: 100.0000 mg | ORAL_CAPSULE | Freq: Two times a day (BID) | ORAL | 0 refills | Status: DC

## 2022-11-22 MED ORDER — SENOKOT EXTRA STRENGTH 17.2 MG PO TABS: 34.4000 mg | ORAL_TABLET | Freq: Every day | ORAL | 3 refills | Status: DC

## 2022-11-22 MED ORDER — PROMETHAZINE HCL 12.5 MG PO TABS: 6.2500 mg | ORAL_TABLET | Freq: Four times a day (QID) | ORAL | Status: DC | PRN

## 2022-11-22 MED ORDER — SPIRONOLACTONE 25 MG PO TABS: 12.5000 mg | ORAL_TABLET | Freq: Every day | ORAL | 1 refills | Status: DC

## 2022-11-22 MED ORDER — METOLAZONE 5 MG PO TABS: 5.0000 mg | ORAL_TABLET | ORAL | 1 refills | Status: DC

## 2022-11-22 MED ORDER — BISOPROLOL FUMARATE 5 MG PO TABS: 2.5000 mg | ORAL_TABLET | Freq: Every day | ORAL | 2 refills | Status: DC

## 2022-11-22 MED ORDER — TRAMADOL HCL 50 MG PO TABS: 50.0000 mg | ORAL_TABLET | Freq: Two times a day (BID) | ORAL | 0 refills | Status: DC | PRN

## 2022-11-22 NOTE — Assessment & Plan Note (Signed)
Chronic Will refer to PT

## 2022-11-22 NOTE — Assessment & Plan Note (Signed)
S/p hip hemiarthroplasty Taking tylenol prn for pain Pain may increase with PT and may need something strong if needed - will rx tramadol 50 mg to use as needed - she would like to avoid narcotics but may need something prn -- her granddaughter will only given this if needed

## 2022-11-22 NOTE — Assessment & Plan Note (Signed)
Chronic Not able to visualize edema but granddaughter states leg swelling is much better - no SOB Continue torsemide 60 mg daily, metolazone 5 mg twice weekly Once palliative care is established they can help evaluate fluid status and get labs

## 2022-11-22 NOTE — Assessment & Plan Note (Signed)
Chronic Lab Results  Component Value Date   TSH 5.034 (H) 09/03/2022  Continue Synthroid 112 mcg daily

## 2022-11-22 NOTE — Telephone Encounter (Signed)
Kyra from [facility] called and said patient was referred to them for palliative care. However, the patient lives in [place] in [place] and they do not service anywhere outside of [place] counties. They informed the patient's family and the family would like to know if Dr. Lawerance Bach can send it to somewhere that can see her.  Best callback for Beverly Kim is [phone]).

## 2022-11-22 NOTE — Assessment & Plan Note (Signed)
Chronic Improved prior to d/c

## 2022-11-22 NOTE — Assessment & Plan Note (Signed)
Chronic   Lab Results  Component Value Date   HGBA1C 6.7 (H) 05/08/2022   Sugars controlled Continue diet control

## 2022-11-22 NOTE — Assessment & Plan Note (Signed)
Chronic GERD controlled Continue pantoprazole 40 mg daily

## 2022-11-22 NOTE — Assessment & Plan Note (Signed)
Chronic Has difficulty falling asleep and staying asleep which is not new Restart trazodone 25 mg nightly

## 2022-11-22 NOTE — Assessment & Plan Note (Signed)
Chronic Continue bisoprolol 2.5 mg daily, spironolactone 12.5 mg daily

## 2022-11-25 ENCOUNTER — Encounter: Payer: Self-pay | Admitting: Internal Medicine

## 2022-11-26 NOTE — Telephone Encounter (Signed)
Spoke with [name] (patient's daughter) today and info given .

## 2022-11-27 MED ORDER — HYDROCORTISONE ACETATE 25 MG RE SUPP: 25.0000 mg | Freq: Two times a day (BID) | RECTAL | 5 refills | Status: DC | PRN

## 2022-11-27 MED ORDER — HYDROCORTISONE 2.5 % EX CREA: TOPICAL_CREAM | Freq: Two times a day (BID) | CUTANEOUS | 2 refills | Status: DC | PRN

## 2022-11-27 NOTE — Progress Notes (Signed)
Care Coordination Note  11/27/2022 Name: Beverly Kim MRN: [id number] DOB: 1924/12/16  Beverly Kim is a 89 y.o. year old female who is a primary care patient of Lawerance Bach, Bobette Mo, MD and is actively engaged with the care management team. I reached out to Beverly Kim by phone today to assist with re-scheduling a follow up visit with the RN Case Manager  Follow up plan: Telephone appointment with care management team member scheduled for: 12/11/2022  Burman Nieves, Ms [facility] Care Coordination Care Guide Direct Dial: [phone]

## 2022-11-27 NOTE — Addendum Note (Signed)
Addended by: Pincus Sanes on: 11/27/2022 09:43 AM   Modules accepted: Orders

## 2022-11-29 DIAGNOSIS — N1832 Chronic kidney disease, stage 3b: Secondary | ICD-10-CM | POA: Diagnosis not present

## 2022-11-29 DIAGNOSIS — E785 Hyperlipidemia, unspecified: Secondary | ICD-10-CM | POA: Diagnosis not present

## 2022-11-29 DIAGNOSIS — E1122 Type 2 diabetes mellitus with diabetic chronic kidney disease: Secondary | ICD-10-CM | POA: Diagnosis not present

## 2022-11-29 DIAGNOSIS — J45991 Cough variant asthma: Secondary | ICD-10-CM | POA: Diagnosis not present

## 2022-11-29 DIAGNOSIS — I495 Sick sinus syndrome: Secondary | ICD-10-CM | POA: Diagnosis not present

## 2022-11-29 DIAGNOSIS — E1140 Type 2 diabetes mellitus with diabetic neuropathy, unspecified: Secondary | ICD-10-CM | POA: Diagnosis not present

## 2022-11-29 DIAGNOSIS — J069 Acute upper respiratory infection, unspecified: Secondary | ICD-10-CM | POA: Diagnosis not present

## 2022-11-29 DIAGNOSIS — I2510 Atherosclerotic heart disease of native coronary artery without angina pectoris: Secondary | ICD-10-CM | POA: Diagnosis not present

## 2022-11-29 DIAGNOSIS — K219 Gastro-esophageal reflux disease without esophagitis: Secondary | ICD-10-CM | POA: Diagnosis not present

## 2022-11-29 DIAGNOSIS — N1831 Chronic kidney disease, stage 3a: Secondary | ICD-10-CM | POA: Diagnosis not present

## 2022-11-29 DIAGNOSIS — S72041D Displaced fracture of base of neck of right femur, subsequent encounter for closed fracture with routine healing: Secondary | ICD-10-CM | POA: Diagnosis not present

## 2022-11-29 DIAGNOSIS — I5042 Chronic combined systolic (congestive) and diastolic (congestive) heart failure: Secondary | ICD-10-CM | POA: Diagnosis not present

## 2022-11-29 DIAGNOSIS — I130 Hypertensive heart and chronic kidney disease with heart failure and stage 1 through stage 4 chronic kidney disease, or unspecified chronic kidney disease: Secondary | ICD-10-CM | POA: Diagnosis not present

## 2022-11-29 DIAGNOSIS — E039 Hypothyroidism, unspecified: Secondary | ICD-10-CM | POA: Diagnosis not present

## 2022-11-29 DIAGNOSIS — I779 Disorder of arteries and arterioles, unspecified: Secondary | ICD-10-CM | POA: Diagnosis not present

## 2022-11-29 DIAGNOSIS — W19XXXD Unspecified fall, subsequent encounter: Secondary | ICD-10-CM | POA: Diagnosis not present

## 2022-11-29 DIAGNOSIS — I4891 Unspecified atrial fibrillation: Secondary | ICD-10-CM | POA: Diagnosis not present

## 2022-11-29 DIAGNOSIS — N189 Chronic kidney disease, unspecified: Secondary | ICD-10-CM | POA: Diagnosis not present

## 2022-11-29 DIAGNOSIS — I480 Paroxysmal atrial fibrillation: Secondary | ICD-10-CM | POA: Diagnosis not present

## 2022-11-30 ENCOUNTER — Telehealth: Payer: Self-pay | Admitting: Internal Medicine

## 2022-11-30 NOTE — Telephone Encounter (Signed)
Verbal orders left on VM today.

## 2022-11-30 NOTE — Telephone Encounter (Signed)
HH ORDERS   Caller Name: [facility] Agency Name: [place] Phone #: [phone](secure)  Service Requested: Nursing (examples: OT/PT/Skilled Nursing/Social Work/Speech Therapy/Wound Care)

## 2022-11-30 NOTE — Telephone Encounter (Signed)
Okay for orders? 

## 2022-12-01 MED ORDER — HYDROCODONE-ACETAMINOPHEN 5-325 MG PO TABS: 1.0000 | ORAL_TABLET | Freq: Four times a day (QID) | ORAL | 0 refills | Status: DC | PRN

## 2022-12-01 NOTE — Addendum Note (Signed)
Addended by: Pincus Sanes on: 12/01/2022 08:23 PM   Modules accepted: Orders

## 2022-12-04 ENCOUNTER — Telehealth: Payer: Self-pay | Admitting: Internal Medicine

## 2022-12-04 NOTE — Telephone Encounter (Signed)
Okay for orders? 

## 2022-12-04 NOTE — Telephone Encounter (Signed)
Called and left message with verbals.

## 2022-12-04 NOTE — Telephone Encounter (Signed)
Charrise called from [facility] wanting verbals for PT: 2 week 1 3 week 2 2 week 2  Also wanting verbals for a hoyer lift (sent to a DME company) [employer].  [phone]

## 2022-12-05 ENCOUNTER — Telehealth: Payer: Self-pay | Admitting: Internal Medicine

## 2022-12-05 NOTE — Telephone Encounter (Signed)
Holly with [facility]en called states she went to see patient and wanted to share her concerns. States patient was responding but was very slow, she was eating but her eyes never opened, her blood pressure was low, she is on 3 different diuretic's, not sure when her last labs were done, she has poor skin turgor. She believes the patient is dehydrated and the caregiver is concerned patient has a UTI due to her being confused and sleeping more. Best callback number is [phone].

## 2022-12-06 NOTE — Telephone Encounter (Signed)
Can they do lab work/ urine tests?  I am not sure if her granddaughter can get her in but that was one of my concerns.   Call granddaughter. Not sure if she can bring her in - I assume that would be difficult.  Hold metolazone for now (getting twice a week).

## 2022-12-06 NOTE — Telephone Encounter (Signed)
MyChart message sent advising how to wean off of Lyrica.

## 2022-12-07 ENCOUNTER — Other Ambulatory Visit: Payer: Self-pay

## 2022-12-07 ENCOUNTER — Emergency Department (HOSPITAL_COMMUNITY): Payer: PPO

## 2022-12-07 ENCOUNTER — Encounter (HOSPITAL_COMMUNITY): Payer: Self-pay | Admitting: Internal Medicine

## 2022-12-07 ENCOUNTER — Telehealth: Payer: Self-pay | Admitting: Internal Medicine

## 2022-12-07 ENCOUNTER — Inpatient Hospital Stay (HOSPITAL_COMMUNITY)
Admission: EM | Admit: 2022-12-07 | Discharge: 2022-12-11 | DRG: 682 | Disposition: A | Discharge status: Home Health | Payer: PPO | Attending: Internal Medicine | Admitting: Internal Medicine

## 2022-12-07 DIAGNOSIS — N1831 Chronic kidney disease, stage 3a: Secondary | ICD-10-CM | POA: Diagnosis not present

## 2022-12-07 DIAGNOSIS — Z8719 Personal history of other diseases of the digestive system: Secondary | ICD-10-CM

## 2022-12-07 DIAGNOSIS — I672 Cerebral atherosclerosis: Secondary | ICD-10-CM | POA: Diagnosis not present

## 2022-12-07 DIAGNOSIS — I2510 Atherosclerotic heart disease of native coronary artery without angina pectoris: Secondary | ICD-10-CM | POA: Diagnosis not present

## 2022-12-07 DIAGNOSIS — Z1152 Encounter for screening for COVID-19: Secondary | ICD-10-CM | POA: Diagnosis not present

## 2022-12-07 DIAGNOSIS — Z90710 Acquired absence of both cervix and uterus: Secondary | ICD-10-CM

## 2022-12-07 DIAGNOSIS — N179 Acute kidney failure, unspecified: Secondary | ICD-10-CM | POA: Diagnosis not present

## 2022-12-07 DIAGNOSIS — I10 Essential (primary) hypertension: Secondary | ICD-10-CM | POA: Diagnosis not present

## 2022-12-07 DIAGNOSIS — E860 Dehydration: Secondary | ICD-10-CM | POA: Diagnosis not present

## 2022-12-07 DIAGNOSIS — Z880 Allergy status to penicillin: Secondary | ICD-10-CM

## 2022-12-07 DIAGNOSIS — R410 Disorientation, unspecified: Secondary | ICD-10-CM | POA: Diagnosis not present

## 2022-12-07 DIAGNOSIS — K219 Gastro-esophageal reflux disease without esophagitis: Secondary | ICD-10-CM | POA: Diagnosis not present

## 2022-12-07 DIAGNOSIS — I130 Hypertensive heart and chronic kidney disease with heart failure and stage 1 through stage 4 chronic kidney disease, or unspecified chronic kidney disease: Secondary | ICD-10-CM | POA: Diagnosis not present

## 2022-12-07 DIAGNOSIS — Z881 Allergy status to other antibiotic agents status: Secondary | ICD-10-CM | POA: Diagnosis not present

## 2022-12-07 DIAGNOSIS — Z8249 Family history of ischemic heart disease and other diseases of the circulatory system: Secondary | ICD-10-CM

## 2022-12-07 DIAGNOSIS — N1832 Chronic kidney disease, stage 3b: Secondary | ICD-10-CM | POA: Diagnosis not present

## 2022-12-07 DIAGNOSIS — I083 Combined rheumatic disorders of mitral, aortic and tricuspid valves: Secondary | ICD-10-CM | POA: Diagnosis not present

## 2022-12-07 DIAGNOSIS — Z66 Do not resuscitate: Secondary | ICD-10-CM | POA: Diagnosis not present

## 2022-12-07 DIAGNOSIS — H906 Mixed conductive and sensorineural hearing loss, bilateral: Secondary | ICD-10-CM | POA: Diagnosis not present

## 2022-12-07 DIAGNOSIS — N170 Acute kidney failure with tubular necrosis: Secondary | ICD-10-CM | POA: Diagnosis not present

## 2022-12-07 DIAGNOSIS — I959 Hypotension, unspecified: Secondary | ICD-10-CM | POA: Diagnosis not present

## 2022-12-07 DIAGNOSIS — Z9049 Acquired absence of other specified parts of digestive tract: Secondary | ICD-10-CM

## 2022-12-07 DIAGNOSIS — Z79890 Hormone replacement therapy (postmenopausal): Secondary | ICD-10-CM | POA: Diagnosis not present

## 2022-12-07 DIAGNOSIS — E782 Mixed hyperlipidemia: Secondary | ICD-10-CM | POA: Diagnosis not present

## 2022-12-07 DIAGNOSIS — G4489 Other headache syndrome: Secondary | ICD-10-CM | POA: Diagnosis not present

## 2022-12-07 DIAGNOSIS — I495 Sick sinus syndrome: Secondary | ICD-10-CM | POA: Diagnosis not present

## 2022-12-07 DIAGNOSIS — B9620 Unspecified Escherichia coli [E. coli] as the cause of diseases classified elsewhere: Secondary | ICD-10-CM | POA: Diagnosis present

## 2022-12-07 DIAGNOSIS — Z96641 Presence of right artificial hip joint: Secondary | ICD-10-CM | POA: Diagnosis present

## 2022-12-07 DIAGNOSIS — R531 Weakness: Secondary | ICD-10-CM | POA: Diagnosis not present

## 2022-12-07 DIAGNOSIS — E876 Hypokalemia: Secondary | ICD-10-CM | POA: Diagnosis not present

## 2022-12-07 DIAGNOSIS — I5042 Chronic combined systolic (congestive) and diastolic (congestive) heart failure: Secondary | ICD-10-CM | POA: Diagnosis not present

## 2022-12-07 DIAGNOSIS — B961 Klebsiella pneumoniae [K. pneumoniae] as the cause of diseases classified elsewhere: Secondary | ICD-10-CM | POA: Diagnosis not present

## 2022-12-07 DIAGNOSIS — Z885 Allergy status to narcotic agent status: Secondary | ICD-10-CM

## 2022-12-07 DIAGNOSIS — N189 Chronic kidney disease, unspecified: Secondary | ICD-10-CM | POA: Diagnosis not present

## 2022-12-07 DIAGNOSIS — Z950 Presence of cardiac pacemaker: Secondary | ICD-10-CM

## 2022-12-07 DIAGNOSIS — Z7401 Bed confinement status: Secondary | ICD-10-CM | POA: Diagnosis not present

## 2022-12-07 DIAGNOSIS — E039 Hypothyroidism, unspecified: Secondary | ICD-10-CM | POA: Diagnosis present

## 2022-12-07 DIAGNOSIS — I442 Atrioventricular block, complete: Secondary | ICD-10-CM | POA: Diagnosis present

## 2022-12-07 DIAGNOSIS — Z91013 Allergy to seafood: Secondary | ICD-10-CM

## 2022-12-07 DIAGNOSIS — I517 Cardiomegaly: Secondary | ICD-10-CM | POA: Diagnosis not present

## 2022-12-07 DIAGNOSIS — R4182 Altered mental status, unspecified: Secondary | ICD-10-CM | POA: Diagnosis not present

## 2022-12-07 DIAGNOSIS — H109 Unspecified conjunctivitis: Secondary | ICD-10-CM | POA: Diagnosis not present

## 2022-12-07 DIAGNOSIS — Z79899 Other long term (current) drug therapy: Secondary | ICD-10-CM | POA: Diagnosis not present

## 2022-12-07 DIAGNOSIS — G9341 Metabolic encephalopathy: Secondary | ICD-10-CM | POA: Diagnosis present

## 2022-12-07 DIAGNOSIS — E785 Hyperlipidemia, unspecified: Secondary | ICD-10-CM | POA: Diagnosis not present

## 2022-12-07 DIAGNOSIS — Z860100 Personal history of colon polyps, unspecified: Secondary | ICD-10-CM

## 2022-12-07 DIAGNOSIS — E890 Postprocedural hypothyroidism: Secondary | ICD-10-CM | POA: Diagnosis not present

## 2022-12-07 DIAGNOSIS — Z91048 Other nonmedicinal substance allergy status: Secondary | ICD-10-CM

## 2022-12-07 DIAGNOSIS — I4821 Permanent atrial fibrillation: Secondary | ICD-10-CM | POA: Diagnosis present

## 2022-12-07 DIAGNOSIS — Z888 Allergy status to other drugs, medicaments and biological substances status: Secondary | ICD-10-CM

## 2022-12-07 LAB — CBC
HCT: 46.5 % — ABNORMAL HIGH (ref 36.0–46.0)
HCT: 43.2 % (ref 36.0–46.0)
Hemoglobin: 14.9 g/dL (ref 12.0–15.0)
Hemoglobin: 14 g/dL (ref 12.0–15.0)
MCH: 29.2 pg (ref 26.0–34.0)
MCH: 29 pg (ref 26.0–34.0)
MCHC: 32 g/dL (ref 30.0–36.0)
MCHC: 32.4 g/dL (ref 30.0–36.0)
MCV: 91 fL (ref 80.0–100.0)
MCV: 89.6 fL (ref 80.0–100.0)
Platelets: 262 10*3/uL (ref 150–400)
Platelets: 255 10*3/uL (ref 150–400)
RBC: 5.11 MIL/uL (ref 3.87–5.11)
RBC: 4.82 MIL/uL (ref 3.87–5.11)
RDW: 17.1 % — ABNORMAL HIGH (ref 11.5–15.5)
RDW: 16.8 % — ABNORMAL HIGH (ref 11.5–15.5)
WBC: 7 10*3/uL (ref 4.0–10.5)
WBC: 6.4 10*3/uL (ref 4.0–10.5)
nRBC: 0 % (ref 0.0–0.2)
nRBC: 0 % (ref 0.0–0.2)

## 2022-12-07 LAB — URINALYSIS, ROUTINE W REFLEX MICROSCOPIC
Bacteria, UA: NONE SEEN
Bilirubin Urine: NEGATIVE
Glucose, UA: NEGATIVE mg/dL
Ketones, ur: NEGATIVE mg/dL
Nitrite: NEGATIVE
Protein, ur: NEGATIVE mg/dL
Specific Gravity, Urine: 1.009 (ref 1.005–1.030)
pH: 7 (ref 5.0–8.0)

## 2022-12-07 LAB — COMPREHENSIVE METABOLIC PANEL
ALT: 10 U/L (ref 0–44)
AST: 27 U/L (ref 15–41)
Albumin: 3.4 g/dL — ABNORMAL LOW (ref 3.5–5.0)
Alkaline Phosphatase: 102 U/L (ref 38–126)
Anion gap: 16 — ABNORMAL HIGH (ref 5–15)
BUN: 103 mg/dL — ABNORMAL HIGH (ref 8–23)
CO2: 27 mmol/L (ref 22–32)
Calcium: 10.1 mg/dL (ref 8.9–10.3)
Chloride: 91 mmol/L — ABNORMAL LOW (ref 98–111)
Creatinine, Ser: 1.49 mg/dL — ABNORMAL HIGH (ref 0.44–1.00)
GFR, Estimated: 32 mL/min — ABNORMAL LOW (ref >60)
Glucose, Bld: 127 mg/dL — ABNORMAL HIGH (ref 70–99)
Potassium: 3.7 mmol/L (ref 3.5–5.1)
Sodium: 134 mmol/L — ABNORMAL LOW (ref 135–145)
Total Bilirubin: 1.1 mg/dL (ref 0.3–1.2)
Total Protein: 8.2 g/dL — ABNORMAL HIGH (ref 6.5–8.1)

## 2022-12-07 LAB — RESP PANEL BY RT-PCR (RSV, FLU A&B, COVID)  RVPGX2
Influenza A by PCR: NEGATIVE
Influenza B by PCR: NEGATIVE
Resp Syncytial Virus by PCR: NEGATIVE
SARS Coronavirus 2 by RT PCR: NEGATIVE

## 2022-12-07 LAB — CREATININE, SERUM
Creatinine, Ser: 1.37 mg/dL — ABNORMAL HIGH (ref 0.44–1.00)
GFR, Estimated: 35 mL/min — ABNORMAL LOW (ref >60)

## 2022-12-07 LAB — MAGNESIUM: Magnesium: 3.1 mg/dL — ABNORMAL HIGH (ref 1.7–2.4)

## 2022-12-07 MED ORDER — LEVOTHYROXINE SODIUM 112 MCG PO TABS
112.0000 ug | ORAL_TABLET | Freq: Every day | ORAL | Status: DC
  Administered 2022-12-08 – 2022-12-10 (×3): 112 ug via ORAL
  Filled 2022-12-07 (×4): qty 1

## 2022-12-07 MED ORDER — TRAZODONE HCL 50 MG PO TABS
25.0000 mg | ORAL_TABLET | Freq: Every day | ORAL | Status: DC
  Administered 2022-12-07 – 2022-12-09 (×3): 25 mg via ORAL
  Filled 2022-12-07 (×4): qty 1

## 2022-12-07 MED ORDER — DICLOFENAC SODIUM 1 % EX GEL
4.0000 g | Freq: Four times a day (QID) | CUTANEOUS | Status: DC
  Administered 2022-12-08 – 2022-12-10 (×10): 4 g via TOPICAL
  Filled 2022-12-07: qty 100

## 2022-12-07 MED ORDER — DOCUSATE SODIUM 100 MG PO CAPS
100.0000 mg | ORAL_CAPSULE | Freq: Two times a day (BID) | ORAL | Status: DC
  Administered 2022-12-08 – 2022-12-09 (×3): 100 mg via ORAL
  Filled 2022-12-07 (×3): qty 1

## 2022-12-07 MED ORDER — HEPARIN SODIUM (PORCINE) 5000 UNIT/ML IJ SOLN
5000.0000 [IU] | Freq: Three times a day (TID) | INTRAMUSCULAR | Status: DC
  Administered 2022-12-07 – 2022-12-10 (×8): 5000 [IU] via SUBCUTANEOUS
  Filled 2022-12-07 (×10): qty 1

## 2022-12-07 MED ORDER — HYDROCORTISONE (PERIANAL) 2.5 % EX CREA: TOPICAL_CREAM | Freq: Two times a day (BID) | CUTANEOUS | Status: DC | PRN

## 2022-12-07 MED ORDER — PROMETHAZINE HCL 12.5 MG PO TABS: 6.2500 mg | ORAL_TABLET | Freq: Four times a day (QID) | ORAL | Status: DC | PRN

## 2022-12-07 MED ORDER — POLYETHYLENE GLYCOL 3350 17 G PO PACK
17.0000 g | PACK | Freq: Two times a day (BID) | ORAL | Status: DC
  Administered 2022-12-08 – 2022-12-09 (×3): 17 g via ORAL
  Filled 2022-12-07 (×2): qty 1

## 2022-12-07 MED ORDER — ONDANSETRON HCL 4 MG/2ML IJ SOLN: 4.0000 mg | Freq: Four times a day (QID) | INTRAMUSCULAR | Status: DC | PRN

## 2022-12-07 MED ORDER — PANTOPRAZOLE SODIUM 40 MG PO TBEC
40.0000 mg | DELAYED_RELEASE_TABLET | Freq: Every day | ORAL | Status: DC
  Administered 2022-12-08 – 2022-12-10 (×3): 40 mg via ORAL
  Filled 2022-12-07 (×4): qty 1

## 2022-12-07 MED ORDER — ACETAMINOPHEN 325 MG PO TABS
650.0000 mg | ORAL_TABLET | Freq: Three times a day (TID) | ORAL | Status: DC
  Administered 2022-12-07 – 2022-12-10 (×9): 650 mg via ORAL
  Filled 2022-12-07 (×10): qty 2

## 2022-12-07 MED ORDER — HYDROCODONE-ACETAMINOPHEN 5-325 MG PO TABS: 1.0000 | ORAL_TABLET | Freq: Four times a day (QID) | ORAL | Status: DC | PRN

## 2022-12-07 MED ORDER — SENNA 8.6 MG PO TABS
34.4000 mg | ORAL_TABLET | Freq: Every day | ORAL | Status: DC
  Administered 2022-12-08 – 2022-12-09 (×2): 34.4 mg via ORAL
  Filled 2022-12-07 (×2): qty 4

## 2022-12-07 MED ORDER — LIDOCAINE 5 % EX PTCH
1.0000 | MEDICATED_PATCH | CUTANEOUS | Status: DC
  Administered 2022-12-09 – 2022-12-10 (×2): 1 via TRANSDERMAL
  Filled 2022-12-07 (×3): qty 1

## 2022-12-07 MED ORDER — ENSURE ENLIVE PO LIQD
1.0000 | Freq: Two times a day (BID) | ORAL | Status: DC
  Administered 2022-12-08 – 2022-12-09 (×2): 237 mL via ORAL

## 2022-12-07 MED ORDER — ONDANSETRON HCL 4 MG PO TABS: 4.0000 mg | ORAL_TABLET | Freq: Four times a day (QID) | ORAL | Status: DC | PRN

## 2022-12-07 MED ORDER — HYDROCORTISONE ACETATE 25 MG RE SUPP: 25.0000 mg | Freq: Two times a day (BID) | RECTAL | Status: DC | PRN

## 2022-12-07 MED ORDER — BISOPROLOL FUMARATE 5 MG PO TABS
2.5000 mg | ORAL_TABLET | Freq: Every day | ORAL | Status: DC
  Administered 2022-12-09 – 2022-12-10 (×2): 2.5 mg via ORAL
  Filled 2022-12-07 (×4): qty 0.5

## 2022-12-07 MED ORDER — SODIUM CHLORIDE 0.9 % IV BOLUS
1000.0000 mL | Freq: Once | INTRAVENOUS | Status: AC
  Administered 2022-12-07: 1000 mL via INTRAVENOUS

## 2022-12-07 MED ORDER — SODIUM CHLORIDE 0.9 % IV SOLN: INTRAVENOUS | Status: DC

## 2022-12-07 MED ORDER — GUAIFENESIN 100 MG/5ML PO LIQD
200.0000 mg | ORAL | Status: DC | PRN
  Administered 2022-12-10: 200 mg via ORAL
  Filled 2022-12-07: qty 10

## 2022-12-07 MED ORDER — ALBUTEROL SULFATE (2.5 MG/3ML) 0.083% IN NEBU: 3.0000 mL | INHALATION_SOLUTION | Freq: Four times a day (QID) | RESPIRATORY_TRACT | Status: DC | PRN

## 2022-12-07 MED ORDER — ADULT MULTIVITAMIN W/MINERALS CH
1.0000 | ORAL_TABLET | Freq: Every day | ORAL | Status: DC
  Administered 2022-12-08 – 2022-12-10 (×3): 1 via ORAL
  Filled 2022-12-07 (×4): qty 1

## 2022-12-07 NOTE — ED Provider Notes (Signed)
[facility] EMERGENCY DEPARTMENT AT [facility] Provider Note   CSN: [id number] Arrival date & time: 12/07/22  1542     History  Chief Complaint  Patient presents with   Altered Mental Status   Weakness   Dehydration    Beverly Kim is a 89 y.o. female.  HPI 89 year old female with past medical history of CHF, hypertension, previous complete heart block now status post pacemaker, atrial fibrillation, CKD presenting for altered mental status.  Patient has been living with her granddaughter.  Per EMS, for the last 2 to 3 days patient has had gradually worsening mental status.  Seems more confused than usual.  With EMS, patient was complaining of pain diffusely throughout her body.  Per EMS, caregiver was reportedly very concerned about dehydration.      Home Medications Prior to Admission medications   Medication Sig Start Date End Date Taking? Authorizing Provider  acetaminophen (TYLENOL) 325 MG tablet Take 650 mg by mouth every 8 (eight) hours.    [provider]  albuterol (VENTOLIN HFA) 108 (90 Base) MCG/ACT inhaler Inhale 1-2 puffs into the lungs every 6 (six) hours as needed for wheezing or shortness of breath. 05/31/22   Burns, Bobette Mo, MD  bisoprolol (ZEBETA) 5 MG tablet Take 0.5 tablets (2.5 mg total) by mouth daily. 11/22/22   Pincus Sanes, MD  clotrimazole (LOTRIMIN) 1 % cream Apply 1 Application topically 2 (two) times daily. 11/20/22   Pincus Sanes, MD  diclofenac Sodium (VOLTAREN) 1 % GEL Apply 4 g topically 4 (four) times daily. BLE    [provider]  docusate sodium (COLACE) 100 MG capsule Take 1 capsule (100 mg total) by mouth 2 (two) times daily. 11/22/22   Pincus Sanes, MD  guaifenesin (ROBITUSSIN) 100 MG/5ML syrup Take 200 mg by mouth every other day as needed for cough.    [provider]  HYDROcodone-acetaminophen (NORCO/VICODIN) 5-325 MG tablet Take 1 tablet by mouth every 6 (six) hours as needed for moderate pain.  12/01/22   Pincus Sanes, MD  hydrocortisone (ANUSOL-HC) 25 MG suppository Place 1 suppository (25 mg total) rectally 2 (two) times daily as needed for hemorrhoids or anal itching. 11/27/22   Pincus Sanes, MD  Hydrocortisone (PREPARATION H EX) Apply 1 Application topically 4 (four) times daily.    [provider]  hydrocortisone 2.5 % cream Apply topically 2 (two) times daily as needed (hemorrhoids). 11/27/22   Pincus Sanes, MD  lidocaine (LIDODERM) 5 % Place 1 patch onto the skin daily. Apply to affected area as directed. Remove & Discard patch within 12 hours. Alternate sites as possible. 10/17/22   [name], MD  metolazone (ZAROXOLYN) 5 MG tablet Take 1 tablet (5 mg total) by mouth 2 (two) times a week. 11/22/22   Pincus Sanes, MD  Multiple Vitamin (MULTIVITAMIN WITH MINERALS) TABS tablet Take 1 tablet by mouth daily. 10/17/22 01/15/23  Montez Morita, PA-C  Nutritional Supplements (NUTRITIONAL DRINK PO) Take 120 mLs by mouth 2 (two) times daily.    [provider]  nystatin (MYCOSTATIN/NYSTOP) powder Apply topically 3 (three) times daily as needed. To affected skin folds (under breast and abdomen) 11/20/22   Burns, Bobette Mo, MD  pantoprazole (PROTONIX) 40 MG tablet Take 1 tablet (40 mg total) by mouth daily. 11/22/22   Pincus Sanes, MD  polyethylene glycol (MIRALAX / GLYCOLAX) 17 g packet Take 17 g by mouth 2 (two) times daily. 10/16/22   Natale Milch,  Kristine Garbe, MD  potassium chloride SA (KLOR-CON M) 20 MEQ tablet Take 20 mEq by mouth 2 (two) times daily. Patient not taking: Reported on 11/13/2022    [provider]  pregabalin (LYRICA) 25 MG capsule Take 1 capsule (25 mg total) by mouth 2 (two) times daily. 11/22/22   Pincus Sanes, MD  promethazine (PHENERGAN) 12.5 MG tablet Take 0.5 tablets (6.25 mg total) by mouth every 6 (six) hours as needed for nausea or vomiting. 11/22/22   Burns, Bobette Mo, MD  Sennosides (SENOKOT EXTRA STRENGTH) 17.2 MG TABS Take 2 tablets  (34.4 mg total) by mouth daily. 11/22/22   Pincus Sanes, MD  spironolactone (ALDACTONE) 25 MG tablet Take 0.5 tablets (12.5 mg total) by mouth daily. 11/22/22   Pincus Sanes, MD  SYNTHROID 112 MCG tablet Take 1 tablet (112 mcg total) by mouth daily before breakfast. 11/22/22   Burns, Bobette Mo, MD  torsemide (DEMADEX) 20 MG tablet Take 3 tablets (60 mg total) by mouth daily. 11/22/22   Pincus Sanes, MD  traZODone (DESYREL) 50 MG tablet Take 0.5 tablets (25 mg total) by mouth at bedtime. 11/22/22   Pincus Sanes, MD      Allergies    Adhesive [tape], Ace inhibitors, Atorvastatin, Clindamycin, Codeine, Latex, Shellfish allergy, Simvastatin, and Penicillins    Review of Systems   Review of Systems  Physical Exam Updated Vital Signs BP 122/74   Pulse 62   Temp (!) 97.4 F (36.3 C) (Oral)   Resp 20   Ht 5\' 2"  (1.575 m)   Wt 76 kg   SpO2 100%   BMI 30.65 kg/m  Physical Exam Constitutional:      General: She is not in acute distress. HENT:     Head: Normocephalic and atraumatic.     Right Ear: External ear normal.     Left Ear: External ear normal.     Nose: Nose normal.     Mouth/Throat:     Mouth: Mucous membranes are dry.  Eyes:     Extraocular Movements: Extraocular movements intact.     Conjunctiva/sclera: Conjunctivae normal.  Cardiovascular:     Rate and Rhythm: Normal rate.  Pulmonary:     Effort: Pulmonary effort is normal. No respiratory distress.  Abdominal:     General: Abdomen is flat.     Palpations: Abdomen is soft.     Tenderness: There is no abdominal tenderness.  Musculoskeletal:        General: No signs of injury.     Right lower leg: No edema.     Left lower leg: No edema.  Skin:    General: Skin is warm and dry.     Findings: No rash.  Neurological:     Mental Status: She is alert. She is disoriented.     Cranial Nerves: No cranial nerve deficit.     Comments: Tells me her name and date of birth.  When asked where she is, patient attempts to  recite her home address.  Unable to answer majority of orientation or history questions     ED Results / Procedures / Treatments   Labs (all labs ordered are listed, but only abnormal results are displayed) Labs Reviewed  CBC - Abnormal; Notable for the following components:      Result Value   HCT 46.5 (*)    RDW 17.1 (*)    All other components within normal limits  COMPREHENSIVE METABOLIC PANEL - Abnormal; Notable for the  following components:   Sodium 134 (*)    Chloride 91 (*)    Glucose, Bld 127 (*)    BUN 103 (*)    Creatinine, Ser 1.49 (*)    Total Protein 8.2 (*)    Albumin 3.4 (*)    GFR, Estimated 32 (*)    Anion gap 16 (*)    All other components within normal limits  URINALYSIS, ROUTINE W REFLEX MICROSCOPIC - Abnormal; Notable for the following components:   Hgb urine dipstick SMALL (*)    Leukocytes,Ua TRACE (*)    All other components within normal limits  MAGNESIUM - Abnormal; Notable for the following components:   Magnesium 3.1 (*)    All other components within normal limits  RESP PANEL BY RT-PCR (RSV, FLU A&B, COVID)  [id number]    EKG EKG Interpretation Date/Time:  Friday December 07 2022 16:03:00 EDT Ventricular Rate:  64 PR Interval:    QRS Duration:  176 QT Interval:  488 QTC Calculation: 504 R Axis:   -84  Text Interpretation: Electronic ventricular pacemaker Confirmed by Cathren Laine ([id number]) on 12/07/2022 4:36:49 PM  Radiology DG Chest Portable 1 View  Result Date: 12/07/2022 CLINICAL DATA:  Altered mental status. EXAM: PORTABLE CHEST 1 VIEW COMPARISON:  11/13/2022 FINDINGS: Stable cardiomegaly. Left-sided pacemaker in place. Chronic interstitial coarsening without interval change. Calcified granuloma and mediastinal lymph nodes. No acute airspace disease, large pleural effusion or pneumothorax. Stable osseous structures. IMPRESSION: Stable cardiomegaly. Chronic interstitial coarsening. No acute findings. Electronically Signed   By: Narda Rutherford M.D.   On: 12/07/2022 18:48   CT Head Wo Contrast  Result Date: 12/07/2022 CLINICAL DATA:  Mental status change, unknown cause EXAM: CT HEAD WITHOUT CONTRAST TECHNIQUE: Contiguous axial images were obtained from the base of the skull through the vertex without intravenous contrast. RADIATION DOSE REDUCTION: This exam was performed according to the departmental dose-optimization program which includes automated exposure control, adjustment of the mA and/or kV according to patient size and/or use of iterative reconstruction technique. COMPARISON:  10/09/2022 FINDINGS: Brain: Stable atrophy chronic small vessel ischemia. No intracranial hemorrhage, mass effect, or midline shift. No hydrocephalus. The basilar cisterns are patent. No evidence of territorial infarct or acute ischemia. No extra-axial or intracranial fluid collection. Vascular: Atherosclerosis of skullbase vasculature without hyperdense vessel or abnormal calcification. Skull: No fracture or focal lesion. Sinuses/Orbits: No acute finding. Other: None. IMPRESSION: 1. No acute intracranial abnormality. 2. Stable atrophy and chronic small vessel ischemia. Electronically Signed   By: Narda Rutherford M.D.   On: 12/07/2022 18:47    Procedures Procedures    Medications Ordered in ED Medications  sodium chloride 0.9 % bolus 1,000 mL (0 mLs Intravenous Stopped 12/07/22 1737)    ED Course/ Medical Decision Making/ A&P                                 Medical Decision Making Amount and/or Complexity of Data Reviewed Labs: ordered. Radiology: ordered. ECG/medicine tests: ordered.  Risk Decision regarding hospitalization.   HAILY CALEY is a 89 y.o. female with significant PMH  of CHF, hypertension, previous complete heart block now status post pacemaker, atrial fibrillation, CKD, who presents to the ED with altered mental status for the last 2 to 3 days.   Initially, patient himself cannot provide much history.  However,  granddaughter at the bedside was able to provide collateral.  Granddaughter states that she has been more  confused recently and is also saying nonsensical things such as that she was going to die and that is why she stopped drinking any fluids.  Differential diagnosis includes infectious (encephalitis, meningitis, pneumonia, UTI, soft tissue), polypharmacy, intoxication, cardiac ischemia, metabolic encephalopathy, CNS insult (ICH, SAH, infarct), seizures, thyroid storm.   CT of the head did not show any acute intracranial abnormality.  Lab work obtained and does not show any leukocytosis or other signs of infection.  On metabolic panel, patient has a creatinine of 1.49, only slightly increased from baseline.  However, her BUN is 103, nearly 2.5 times what it was just a few weeks ago.  No evidence of urinary tract infection.  Given patient's metabolic panel, concern is for dehydration and uremia causing altered mental status.  She was provided IV fluids while in the emergency department, but will need inpatient admission for continued management of her fluid status and altered mental status.  Handoff provided to inpatient team.   Patient treatment plan discussed with and medical decision making supervised by my attending physician Dr. Denton Lank.     Final Clinical Impression(s) / ED Diagnoses Final diagnoses:  Dehydration  Confusion    Rx / DC Orders ED Discharge Orders     None         Lyman Speller, MD 12/07/22 Ninfa Linden    Cathren Laine, MD 12/10/22 [phone]

## 2022-12-07 NOTE — H&P (Signed)
History and Physical    Patient: Beverly Kim:[id number] DOB: 09/08/1924 DOA: 12/07/2022 DOS: the patient was seen and examined on 12/07/2022 PCP: Pincus Sanes, MD  Patient coming from: Home  Chief Complaint:  Chief Complaint  Patient presents with   Altered Mental Status   Weakness   Dehydration   HPI: Beverly Kim is a 89 y.o. female with medical history significant of essential hypertension, hypothyroidism, hyperlipidemia, paroxysmal atrial fibrillation, sick sinus syndrome, chronic heart failure with preserved ejection fraction, chronic kidney disease stage III was brought in from home with complaint of generalized weakness and poor oral intake.  Patient was seen by her PCP recently in the outpatient setting.  She has blood work done that shows some mild AKI.  She was recently in rehab but got discharged from rehab to home.  She has progressively gotten worse and altered.  Granddaughter brought her to the ER for evaluation.  They initially thought she may have had some UTI.  In the ER however urinalysis was negative.  Acute flu assays negative.  Patient found to have AKI.  Hide BUN has gone up from 19-103 in the last 1 month.  Patient is on 3 diuretics including torsemide, metolazone and Aldactone.  She is also on ACE inhibitor.  Patient has been admitted with generalized weakness, AKI, dehydration and evidence of uremia.  Review of Systems: As mentioned in the history of present illness. All other systems reviewed and are negative. Past Medical History:  Diagnosis Date   Acute blood loss anemia 09/04/2022   Cardiac pacemaker 05/2016   Sick sinus syndrome/complete heart block   Chronic heart failure with preserved ejection fraction (HFpEF) (HCC)    Colon polyps    Diverticulosis    Dyspnea    GERD (gastroesophageal reflux disease)    Guaiac positive stools 09/04/2022   Hearing loss of both ears    Hepatic cyst    Hiatal hernia    Hyperlipidemia    Hypertension     Hypothyroidism    Low back pain    Non-occlusive coronary artery disease 04/2001   Cardiac Cath 04/18/2001: 50 and 70% mid LAD after D1. = Nonischemic by Myoview.  Medical management.   Other left bundle branch block    PAF (paroxysmal atrial fibrillation) (HCC) 08/16/2016   observed on PPM interrogation, asymptomatic, chad2vasc score is 5.  Bisoprolol for rate control, Xarelto for anticoagulation.   Rectal bleeding 09/04/2022   URI (upper respiratory infection)    Past Surgical History:  Procedure Laterality Date   BIOPSY  09/05/2022   Procedure: BIOPSY;  Surgeon: Meridee Score Netty Starring., MD;  Location: [facility] ENDOSCOPY;  Service: Gastroenterology;;   CARDIOVERSION N/A 01/16/2019   Procedure: CARDIOVERSION;  Surgeon: Wendall Stade, MD;  Location: [facility] ENDOSCOPY;  Service: Cardiovascular;  Laterality: N/A;   CHOLECYSTECTOMY N/A 12/15/2019   Procedure: LAPAROSCOPIC CHOLECYSTECTOMY;  Surgeon: Almond Lint, MD;  Location: [facility] OR;  Service: General;  Laterality: N/A;   COLONOSCOPY     COLONOSCOPY N/A 09/05/2022   Procedure: COLONOSCOPY;  Surgeon: Lemar Lofty., MD;  Location[facility] ENDOSCOPY;  Service: Gastroenterology;  Laterality: N/A;   ERCP N/A 07/21/2019   Procedure: ENDOSCOPIC RETROGRADE CHOLANGIOPANCREATOGRAPHY (ERCP);  Surgeon: Rachael Fee, MD;  Location: [facility] ENDOSCOPY;  Service: Endoscopy;  Laterality: N/A;   HERNIA REPAIR     HIP ARTHROPLASTY Right 10/11/2022   Procedure: ARTHROPLASTY BIPOLAR HIP (HEMIARTHROPLASTY);  Surgeon: Myrene Galas, MD;  Location: [facility] OR;  Service: Orthopedics;  Laterality: Right;  HOT HEMOSTASIS N/A 09/05/2022   Procedure: HOT HEMOSTASIS (ARGON PLASMA COAGULATION/BICAP);  Surgeon: Lemar Lofty., MD;  Location[facility] ENDOSCOPY;  Service: Gastroenterology;  Laterality: N/A;   LEFT HEART CATH AND CORONARY ANGIOGRAPHY  04/18/2001   Dr. Riley Kill: EF 60 to 65%.  2+ MR.  Upper LM takeoff with 90 degree bend just inside the ostium.  50-70% mid LAD after D1.   D1 is 30%.    Distal LAD wraps the apex and is free of disease.  LCx is mostly large OM branch.  RCA provides PDA and PL branches and Free of disease. -->  Presumably evaluated by Myoview that was nonischemic, therefore no PCI performed.   NM MYOVIEW LTD  01/2012   Normal EF.  Normal study.  No ischemia or infarction.   PACEMAKER IMPLANT Left 05/15/2016   SJM Assirity MRI dual chamber PPM implanted by Dr Johney Frame for CHB   POLYPECTOMY  09/05/2022   Procedure: POLYPECTOMY;  Surgeon: Mansouraty, Netty Starring., MD;  Location: [facility] ENDOSCOPY;  Service: Gastroenterology;;   REMOVAL OF STONES  07/21/2019   Procedure: REMOVAL OF STONES;  Surgeon: Rachael Fee, MD;  Location: [facility] ENDOSCOPY;  Service: Endoscopy;;   SPHINCTEROTOMY  07/21/2019   Procedure: Dennison Mascot;  Surgeon: Rachael Fee, MD;  Location: [facility] ENDOSCOPY;  Service: Endoscopy;;   THYROIDECTOMY     TONSILLECTOMY AND ADENOIDECTOMY     TOTAL ABDOMINAL HYSTERECTOMY W/ BILATERAL SALPINGOOPHORECTOMY     TRANSTHORACIC ECHOCARDIOGRAM  06/27/2016   Moderate basal-septal LVH, EF 60 to 65%.  No R WMA.  GR 1 DD.  Mild aortic valve calcification.  No AS.  Trivial MR.--Indicated improved EF compared to prior study in 2015.   TRANSTHORACIC ECHOCARDIOGRAM  02/22/2012   12/'13 -a) EF 50 and 55%.  Normal function.-Moderate MR.  Mild LA dilation.; b) 11/2015Echo for shortness of breath => reduced EF of 45 to 50%.  Mild LVH.  Inferior reversible HK.  GRII DD.  Moderate MR.   TRANSTHORACIC ECHOCARDIOGRAM  10/2019   EF 60 -65%.  No RWMA.  Severe concentric LVH.  Unable to assess diastolic function.  Mild biatrial dilation.  Moderate MR moderate TR, no AS   TRANSTHORACIC ECHOCARDIOGRAM  06/15/2021   EF 40 to 45%.  Paradoxical septal motion from pacemaker.  Mild concentric LVH.  Indeterminate diastolic pressure.  Severe biatrial enlargement would argue significant diastolic dysfunction.  Severe MR and severe TR. => EF down from 60 to 65%.   Social History:   reports that she has never smoked. She has never used smokeless tobacco. She reports that she does not drink alcohol and does not use drugs.  Allergies  Allergen Reactions   Adhesive [Tape] Itching, Dermatitis, Rash and Other (See Comments)    Blisters and "skin bubbles"   Ace Inhibitors Cough   Atorvastatin Other (See Comments)    REACTION: Reaction not known   Clindamycin Other (See Comments)    Unknown   Codeine Other (See Comments)    hallucinations    Latex Other (See Comments)    blisters   Shellfish Allergy Other (See Comments)    "gallbladder attack"   Simvastatin Other (See Comments)    fatigue   Penicillins Rash    Family History  Problem Relation Age of Onset   Coronary artery disease Other        family hx of 1st degree relative <50   Diabetes Other        family hx of   Other Other  cardiovascular disorder family hx of   Other Other        neurological disorder family hx of   Other Other        respiratory disease family hx of   Heart attack Daughter    Heart Problems Other        all children   Sudden death Son    Heart disease Brother    Heart disease Sister    Colon cancer Neg Hx    Colon polyps Neg Hx     Prior to Admission medications   Medication Sig Start Date End Date Taking? Authorizing Provider  acetaminophen (TYLENOL) 325 MG tablet Take 650 mg by mouth every 8 (eight) hours.    [provider]  albuterol (VENTOLIN HFA) 108 (90 Base) MCG/ACT inhaler Inhale 1-2 puffs into the lungs every 6 (six) hours as needed for wheezing or shortness of breath. 05/31/22   Burns, Bobette Mo, MD  bisoprolol (ZEBETA) 5 MG tablet Take 0.5 tablets (2.5 mg total) by mouth daily. 11/22/22   Pincus Sanes, MD  clotrimazole (LOTRIMIN) 1 % cream Apply 1 Application topically 2 (two) times daily. 11/20/22   Pincus Sanes, MD  diclofenac Sodium (VOLTAREN) 1 % GEL Apply 4 g topically 4 (four) times daily. BLE    [provider]  docusate sodium  (COLACE) 100 MG capsule Take 1 capsule (100 mg total) by mouth 2 (two) times daily. 11/22/22   Pincus Sanes, MD  guaifenesin (ROBITUSSIN) 100 MG/5ML syrup Take 200 mg by mouth every other day as needed for cough.    [provider]  HYDROcodone-acetaminophen (NORCO/VICODIN) 5-325 MG tablet Take 1 tablet by mouth every 6 (six) hours as needed for moderate pain. 12/01/22   Pincus Sanes, MD  hydrocortisone (ANUSOL-HC) 25 MG suppository Place 1 suppository (25 mg total) rectally 2 (two) times daily as needed for hemorrhoids or anal itching. 11/27/22   Pincus Sanes, MD  Hydrocortisone (PREPARATION H EX) Apply 1 Application topically 4 (four) times daily.    [provider]  hydrocortisone 2.5 % cream Apply topically 2 (two) times daily as needed (hemorrhoids). 11/27/22   Pincus Sanes, MD  lidocaine (LIDODERM) 5 % Place 1 patch onto the skin daily. Apply to affected area as directed. Remove & Discard patch within 12 hours. Alternate sites as possible. 10/17/22   [name], MD  metolazone (ZAROXOLYN) 5 MG tablet Take 1 tablet (5 mg total) by mouth 2 (two) times a week. 11/22/22   Pincus Sanes, MD  Multiple Vitamin (MULTIVITAMIN WITH MINERALS) TABS tablet Take 1 tablet by mouth daily. 10/17/22 01/15/23  Montez Morita, PA-C  Nutritional Supplements (NUTRITIONAL DRINK PO) Take 120 mLs by mouth 2 (two) times daily.    [provider]  nystatin (MYCOSTATIN/NYSTOP) powder Apply topically 3 (three) times daily as needed. To affected skin folds (under breast and abdomen) 11/20/22   Burns, Bobette Mo, MD  pantoprazole (PROTONIX) 40 MG tablet Take 1 tablet (40 mg total) by mouth daily. 11/22/22   Pincus Sanes, MD  polyethylene glycol (MIRALAX / GLYCOLAX) 17 g packet Take 17 g by mouth 2 (two) times daily. 10/16/22   [name], MD  potassium chloride SA (KLOR-CON M) 20 MEQ tablet Take 20 mEq by mouth 2 (two) times daily. Patient not taking: Reported on 11/13/2022    [provider]  pregabalin (LYRICA) 25 MG capsule Take 1 capsule (25 mg total) by mouth 2 (two)  times daily. 11/22/22   Pincus Sanes, MD  promethazine (PHENERGAN) 12.5 MG tablet Take 0.5 tablets (6.25 mg total) by mouth every 6 (six) hours as needed for nausea or vomiting. 11/22/22   Burns, Bobette Mo, MD  Sennosides (SENOKOT EXTRA STRENGTH) 17.2 MG TABS Take 2 tablets (34.4 mg total) by mouth daily. 11/22/22   Pincus Sanes, MD  spironolactone (ALDACTONE) 25 MG tablet Take 0.5 tablets (12.5 mg total) by mouth daily. 11/22/22   Pincus Sanes, MD  SYNTHROID 112 MCG tablet Take 1 tablet (112 mcg total) by mouth daily before breakfast. 11/22/22   Burns, Bobette Mo, MD  torsemide (DEMADEX) 20 MG tablet Take 3 tablets (60 mg total) by mouth daily. 11/22/22   Pincus Sanes, MD  traZODone (DESYREL) 50 MG tablet Take 0.5 tablets (25 mg total) by mouth at bedtime. 11/22/22   Pincus Sanes, MD    Physical Exam: Vitals:   12/07/22 1600 12/07/22 1800 12/07/22 1900 12/07/22 1915  BP:  122/74 122/64   Pulse:  62 66 67  Resp:  20  17  Temp:      TempSrc:      SpO2:  100% 100% 96%  Weight: 76 kg     Height: 5\' 2"  (1.575 m)      Constitutional: Frail, pleasant, NAD, calm, comfortable Eyes: PERRL, lids and conjunctivae normal ENMT: Mucous membranes are dry. Posterior pharynx clear of any exudate or lesions.Normal dentition.  Neck: normal, supple, no masses, no thyromegaly Respiratory: clear to auscultation bilaterally, no wheezing, no crackles. Normal respiratory effort. No accessory muscle use.  Cardiovascular: Regular rate and rhythm, no murmurs / rubs / gallops. No extremity edema. 2+ pedal pulses. No carotid bruits.  Abdomen: no tenderness, no masses palpated. No hepatosplenomegaly. Bowel sounds positive.  Musculoskeletal: Good range of motion, no joint swelling or tenderness, Skin: no rashes, lesions, ulcers. No induration Neurologic: CN 2-12 grossly intact. Sensation intact, DTR normal. Strength 5/5 in  all 4.  Psychiatric: Normal judgment and insight. Alert and oriented x 3. Normal mood  Data Reviewed:  Sodium 134, potassium 3.7 chloride 91.  Glucose 127 BUN 103 creatinine 1.49.  Gap of 16 magnesium 3.1.  Albumin 3.4.  CBC within normal.  Acute viral screen is negative.  Chest x-ray showed no acute findings EKG showed normal sinus rhythm.  Urinalysis essentially negative.  Head CT without contrast showed no acute findings.  Assessment and Plan:  #1 generalized weakness: Most likely secondary to significant dehydration and AKI.  Patient will be admitted for evaluation.  We will hold all diuretics and ACE inhibitor.  Patient will also get low level of normal saline infusion at 50 cc an hour.  She does have history of diastolic CHF so we will be careful so not to fluid overload her.  #2 acute on CKD 3A: Probably due to prerenal causes.  Continue hydration as above.  #3 hypothyroidism: Continue levothyroxine.  #4 hyperlipidemia: Continue statin  #5 essential hypertension: Continue blood pressure medications.  #6 coronary artery disease: Stable  #7 GERD: Continue with PPIs  #8 chronic combined systolic and diastolic heart failure: Appears compensated.    Advance Care Planning:   Code Status: Do not attempt resuscitation (DNR) PRE-ARREST INTERVENTIONS DESIRED   Consults: None  Family Communication: Granddaughter at bedside  Severity of Illness: The appropriate patient status for this patient is OBSERVATION. Observation status is judged to be reasonable and necessary in order to provide the required intensity of service to ensure the  patient's safety. The patient's presenting symptoms, physical exam findings, and initial radiographic and laboratory data in the context of their medical condition is felt to place them at decreased risk for further clinical deterioration. Furthermore, it is anticipated that the patient will be medically stable for discharge from the hospital within 2  midnights of admission.   AuthorLonia Blood, MD 12/07/2022 7:58 PM  For on call review [email or web address].

## 2022-12-07 NOTE — ED Provider Notes (Signed)
[facility] AT [facility] Provider Note   CSN: [id number] Arrival date & time: 12/07/22  1542     History {Add pertinent medical, surgical, social history, OB history to HPI:1} Chief Complaint  Patient presents with   Altered Mental Status   Weakness   Dehydration    Beverly Kim is a 89 y.o. female.  Pt from home via EMS, indicates were called for general weakness, relatively poor po intake, and being told that recent outpatient labs showed significant dehydration. Pt s/p recent rehab stay in post op period after hip fracture and related surgery. EMS indicates pt says 'everything hurts'. Pt limited/poor historian. Denies focal pain. Denies trauma/fall. Denies fever or chills.   The history is provided by the patient, the EMS personnel and medical records. The history is limited by the condition of the patient.  Altered Mental Status Associated symptoms: weakness   Associated symptoms: no abdominal pain, no fever, no headaches, no rash and no vomiting   Weakness Associated symptoms: no abdominal pain, no chest pain, no cough, no diarrhea, no dysuria, no fever, no headaches, no shortness of breath and no vomiting        Home Medications Prior to Admission medications   Medication Sig Start Date End Date Taking? Authorizing Provider  acetaminophen (TYLENOL) 325 MG tablet Take 650 mg by mouth every 8 (eight) hours.    [provider]  albuterol (VENTOLIN HFA) 108 (90 Base) MCG/ACT inhaler Inhale 1-2 puffs into the lungs every 6 (six) hours as needed for wheezing or shortness of breath. 05/31/22   Burns, Bobette Mo, MD  bisoprolol (ZEBETA) 5 MG tablet Take 0.5 tablets (2.5 mg total) by mouth daily. 11/22/22   Pincus Sanes, MD  clotrimazole (LOTRIMIN) 1 % cream Apply 1 Application topically 2 (two) times daily. 11/20/22   Pincus Sanes, MD  diclofenac Sodium (VOLTAREN) 1 % GEL Apply 4 g topically 4 (four) times daily. BLE    [provider]  docusate sodium (COLACE) 100 MG capsule Take 1 capsule (100 mg total) by mouth 2 (two) times daily. 11/22/22   Pincus Sanes, MD  guaifenesin (ROBITUSSIN) 100 MG/5ML syrup Take 200 mg by mouth every other day as needed for cough.    [provider]  HYDROcodone-acetaminophen (NORCO/VICODIN) 5-325 MG tablet Take 1 tablet by mouth every 6 (six) hours as needed for moderate pain. 12/01/22   Pincus Sanes, MD  hydrocortisone (ANUSOL-HC) 25 MG suppository Place 1 suppository (25 mg total) rectally 2 (two) times daily as needed for hemorrhoids or anal itching. 11/27/22   Pincus Sanes, MD  Hydrocortisone (PREPARATION H EX) Apply 1 Application topically 4 (four) times daily.    [provider]  hydrocortisone 2.5 % cream Apply topically 2 (two) times daily as needed (hemorrhoids). 11/27/22   Pincus Sanes, MD  lidocaine (LIDODERM) 5 % Place 1 patch onto the skin daily. Apply to affected area as directed. Remove & Discard patch within 12 hours. Alternate sites as possible. 10/17/22   [name], MD  metolazone (ZAROXOLYN) 5 MG tablet Take 1 tablet (5 mg total) by mouth 2 (two) times a week. 11/22/22   Pincus Sanes, MD  Multiple Vitamin (MULTIVITAMIN WITH MINERALS) TABS tablet Take 1 tablet by mouth daily. 10/17/22 01/15/23  Montez Morita, PA-C  Nutritional Supplements (NUTRITIONAL DRINK PO) Take 120 mLs by mouth 2 (two) times daily.    [provider]  nystatin (MYCOSTATIN/NYSTOP) powder Apply  topically 3 (three) times daily as needed. To affected skin folds (under breast and abdomen) 11/20/22   Burns, Bobette Mo, MD  pantoprazole (PROTONIX) 40 MG tablet Take 1 tablet (40 mg total) by mouth daily. 11/22/22   Pincus Sanes, MD  polyethylene glycol (MIRALAX / GLYCOLAX) 17 g packet Take 17 g by mouth 2 (two) times daily. 10/16/22   [name], MD  potassium chloride SA (KLOR-CON M) 20 MEQ tablet Take 20 mEq by mouth 2 (two) times daily. Patient not taking:  Reported on 11/13/2022    [provider]  pregabalin (LYRICA) 25 MG capsule Take 1 capsule (25 mg total) by mouth 2 (two) times daily. 11/22/22   Pincus Sanes, MD  promethazine (PHENERGAN) 12.5 MG tablet Take 0.5 tablets (6.25 mg total) by mouth every 6 (six) hours as needed for nausea or vomiting. 11/22/22   Burns, Bobette Mo, MD  Sennosides (SENOKOT EXTRA STRENGTH) 17.2 MG TABS Take 2 tablets (34.4 mg total) by mouth daily. 11/22/22   Pincus Sanes, MD  spironolactone (ALDACTONE) 25 MG tablet Take 0.5 tablets (12.5 mg total) by mouth daily. 11/22/22   Pincus Sanes, MD  SYNTHROID 112 MCG tablet Take 1 tablet (112 mcg total) by mouth daily before breakfast. 11/22/22   Burns, Bobette Mo, MD  torsemide (DEMADEX) 20 MG tablet Take 3 tablets (60 mg total) by mouth daily. 11/22/22   Pincus Sanes, MD  traZODone (DESYREL) 50 MG tablet Take 0.5 tablets (25 mg total) by mouth at bedtime. 11/22/22   Pincus Sanes, MD      Allergies    Adhesive [tape], Ace inhibitors, Atorvastatin, Clindamycin, Codeine, Latex, Shellfish allergy, Simvastatin, and Penicillins    Review of Systems   Review of Systems  Constitutional:  Negative for chills and fever.  Eyes:  Negative for pain and redness.  Respiratory:  Negative for cough and shortness of breath.   Cardiovascular:  Negative for chest pain.  Gastrointestinal:  Negative for abdominal pain, diarrhea and vomiting.  Genitourinary:  Negative for dysuria.  Musculoskeletal:  Negative for neck pain.  Skin:  Negative for rash.  Neurological:  Positive for weakness. Negative for headaches.    Physical Exam Updated Vital Signs BP 108/76 (BP Location: Left Arm)   Pulse 76   Temp (!) 97.4 F (36.3 C) (Oral)   Resp (!) 24   Ht 1.575 m (5\' 2" )   Wt 76 kg   SpO2 98%   BMI 30.65 kg/m  Physical Exam Vitals and nursing note reviewed.  Constitutional:      Appearance: Normal appearance. She is well-developed.  HENT:     Head: Atraumatic.     Nose: Nose  normal.     Mouth/Throat:     Mouth: Mucous membranes are moist.  Eyes:     General: No scleral icterus.    Conjunctiva/sclera: Conjunctivae normal.     Pupils: Pupils are equal, round, and reactive to light.  Neck:     Vascular: No carotid bruit.     Trachea: No tracheal deviation.     Comments: No stiffness or rigidity.  Cardiovascular:     Rate and Rhythm: Normal rate and regular rhythm.     Pulses: Normal pulses.     Heart sounds: Normal heart sounds. No murmur heard.    No friction rub. No gallop.  Pulmonary:     Effort: Pulmonary effort is normal. No respiratory distress.     Breath sounds: Normal breath sounds.  Abdominal:     General: Bowel sounds are normal. There is no distension.     Palpations: Abdomen is soft.     Tenderness: There is no abdominal tenderness.  Genitourinary:    Comments: No cva tenderness.  Musculoskeletal:        General: No swelling or tenderness.     Cervical back: Normal range of motion and neck supple. No rigidity or tenderness. No muscular tenderness.     Right lower leg: No edema.     Left lower leg: No edema.  Skin:    General: Skin is warm and dry.     Findings: No rash.  Neurological:     Mental Status: She is alert.     Comments: Alert, speech normal. Motor/sens grossly intact bil.   Psychiatric:        Mood and Affect: Mood normal.     ED Results / Procedures / Treatments   Labs (all labs ordered are listed, but only abnormal results are displayed) Results for orders placed or performed during the hospital encounter of 11/13/22  Resp panel by RT-PCR (RSV, Flu A&B, Covid) Anterior Nasal Swab   Specimen: Anterior Nasal Swab  Result Value Ref Range   SARS Coronavirus 2 by RT PCR NEGATIVE NEGATIVE   Influenza A by PCR NEGATIVE NEGATIVE   Influenza B by PCR NEGATIVE NEGATIVE   Resp Syncytial Virus by PCR NEGATIVE NEGATIVE  CBC  Result Value Ref Range   WBC 7.2 4.0 - 10.5 K/uL   RBC 3.74 (L) 3.87 - 5.11 MIL/uL   Hemoglobin  11.5 (L) 12.0 - 15.0 g/dL   HCT 16.1 (L) 09.6 - 04.5 %   MCV 96.0 80.0 - 100.0 fL   MCH 30.7 26.0 - 34.0 pg   MCHC 32.0 30.0 - 36.0 g/dL   RDW 40.9 (H) 81.1 - 91.4 %   Platelets 153 150 - 400 K/uL   nRBC 0.0 0.0 - 0.2 %  Brain natriuretic peptide  Result Value Ref Range   B Natriuretic Peptide 1,168.1 (H) 0.0 - 100.0 pg/mL  Comprehensive metabolic panel  Result Value Ref Range   Sodium 131 (L) 135 - 145 mmol/L   Potassium 4.6 3.5 - 5.1 mmol/L   Chloride 100 98 - 111 mmol/L   CO2 19 (L) 22 - 32 mmol/L   Glucose, Bld 95 70 - 99 mg/dL   BUN 19 8 - 23 mg/dL   Creatinine, Ser 7.82 (H) 0.44 - 1.00 mg/dL   Calcium 9.0 8.9 - 95.6 mg/dL   Total Protein 6.2 (L) 6.5 - 8.1 g/dL   Albumin 2.9 (L) 3.5 - 5.0 g/dL   AST 22 15 - 41 U/L   ALT 10 0 - 44 U/L   Alkaline Phosphatase 87 38 - 126 U/L   Total Bilirubin 0.6 0.3 - 1.2 mg/dL   GFR, Estimated 48 (L) >60 mL/min   Anion gap 12 5 - 15  Troponin I (High Sensitivity)  Result Value Ref Range   Troponin I (High Sensitivity) 19 (H) <18 ng/L  Troponin I (High Sensitivity)  Result Value Ref Range   Troponin I (High Sensitivity) 19 (H) <18 ng/L   DG Chest Port 1 View  Result Date: 11/13/2022 CLINICAL DATA:  Shortness of breath EXAM: PORTABLE CHEST 1 VIEW COMPARISON:  X-ray 10/03/2022 FINDINGS: Enlarged heart with a calcified aorta. Underinflation with some interstitial changes and chronic changes of the lungs. The mild lung base opacities very well could be bronchovascular crowding and atelectasis with  a decreased inflation compared to prior x-ray. Left upper chest pacemaker battery pack with leads along the right side of the enlarged heart. Overlapping cardiac leads. Presumed calcified left-sided lung nodule and lymph nodes. IMPRESSION: Enlarged heart with pacemaker. Chronic interstitial lung changes with underinflation and bronchovascular crowding with some basilar presumed atelectasis. Electronically Signed   By: Karen Kays M.D.   On: 11/13/2022  15:28    EKG None  Radiology No results found.  Procedures Procedures  {Document cardiac monitor, telemetry assessment procedure when appropriate:1}  Medications Ordered in ED Medications  sodium chloride 0.9 % bolus 1,000 mL (has no administration in time range)    ED Course/ Medical Decision Making/ A&P   {   Click here for ABCD2, HEART and other calculatorsREFRESH Note before signing :1}                              Medical Decision Making Amount and/or Complexity of Data Reviewed Labs: ordered.   Iv ns. Continuous pulse ox and cardiac monitoring. Labs ordered/sent. Imaging ordered.   Differential diagnosis includes dehydration, uti, etc. Dispo decision including potential need for admission considered - will get labs and imaging and reassess.   Reviewed nursing notes and prior charts for additional history. External reports reviewed. Additional history from: EMS.   Cardiac monitor: sinus rhythm, rate 76.  Labs reviewed/interpreted by me -   Xrays reviewed/interpreted by me -      {Document critical care time when appropriate:1} {Document review of labs and clinical decision tools ie heart score, Chads2Vasc2 etc:1}  {Document your independent review of radiology images, and any outside records:1} {Document your discussion with family members, caretakers, and with consultants:1} {Document social determinants of health affecting pt's care:1} {Document your decision making why or why not admission, treatments were needed:1} Final Clinical Impression(s) / ED Diagnoses Final diagnoses:  None    Rx / DC Orders ED Discharge Orders     None

## 2022-12-07 NOTE — ED Notes (Signed)
ED TO INPATIENT HANDOFF REPORT  ED Nurse Name and Phone #: Einar Grad [phone]  S Name/Age/Gender Beverly Kim 89 y.o. female Room/Bed: 019C/019C  Code Status   Code Status: Prior  Home/SNF/Other Home Patient oriented to: self, place, and time Is this baseline? Yes   Triage Complete: Triage complete  Chief Complaint Acute metabolic encephalopathy [G93.41]  Triage Note Patient presents via EMS from home with weakness and increased confusion x2 days. Per EMS, patient has been repetitive. Per EMS, home health contacted the patient's granddaughter and stated that patient was dehydrated and needed to come to the emergency department. Patient reports that "everything" hurts upon arrival.   Allergies Allergies  Allergen Reactions   Adhesive [Tape] Itching, Dermatitis, Rash and Other (See Comments)    Blisters and "skin bubbles"   Ace Inhibitors Cough   Atorvastatin Other (See Comments)    REACTION: Reaction not known   Clindamycin Other (See Comments)    Unknown   Codeine Other (See Comments)    hallucinations    Latex Other (See Comments)    blisters   Shellfish Allergy Other (See Comments)    "gallbladder attack"   Simvastatin Other (See Comments)    fatigue   Penicillins Rash    Level of Care/Admitting Diagnosis ED Disposition     ED Disposition  Admit   Condition  --   Comment  Hospital [facility]: [facility] [[id number]]  Level of Care: Med-Surg [[id number]]  May admit patient to [facility] or [facility] if equivalent level of care is available:: Yes  Covid Evaluation: Asymptomatic - no recent exposure (last 10 days) testing not required  Diagnosis: Acute metabolic encephalopathy [[id number]]  Admitting Physician: Rometta Emery [[id number]]  Attending Physician: Rometta Emery [[id number]]  Certification:: I certify this patient will need inpatient services for at least 2 midnights  Expected Medical Readiness: 12/11/2022          B Medical/Surgery  History Past Medical History:  Diagnosis Date   Acute blood loss anemia 09/04/2022   Cardiac pacemaker 05/2016   Sick sinus syndrome/complete heart block   Chronic heart failure with preserved ejection fraction (HFpEF) (HCC)    Colon polyps    Diverticulosis    Dyspnea    GERD (gastroesophageal reflux disease)    Guaiac positive stools 09/04/2022   Hearing loss of both ears    Hepatic cyst    Hiatal hernia    Hyperlipidemia    Hypertension    Hypothyroidism    Low back pain    Non-occlusive coronary artery disease 04/2001   Cardiac Cath 04/18/2001: 50 and 70% mid LAD after D1. = Nonischemic by Myoview.  Medical management.   Other left bundle branch block    PAF (paroxysmal atrial fibrillation) (HCC) 08/16/2016   observed on PPM interrogation, asymptomatic, chad2vasc score is 5.  Bisoprolol for rate control, Xarelto for anticoagulation.   Rectal bleeding 09/04/2022   URI (upper respiratory infection)    Past Surgical History:  Procedure Laterality Date   BIOPSY  09/05/2022   Procedure: BIOPSY;  Surgeon: Lemar Lofty., MD;  Location: [facility] ENDOSCOPY;  Service: Gastroenterology;;   CARDIOVERSION N/A 01/16/2019   Procedure: CARDIOVERSION;  Surgeon: Wendall Stade, MD;  Location: [facility] ENDOSCOPY;  Service: Cardiovascular;  Laterality: N/A;   CHOLECYSTECTOMY N/A 12/15/2019   Procedure: LAPAROSCOPIC CHOLECYSTECTOMY;  Surgeon: Almond Lint, MD;  Location: [facility] OR;  Service: General;  Laterality: N/A;   COLONOSCOPY     COLONOSCOPY N/A 09/05/2022  Procedure: COLONOSCOPY;  Surgeon: Meridee Score Netty Starring., MD;  Location: [facility] ENDOSCOPY;  Service: Gastroenterology;  Laterality: N/A;   ERCP N/A 07/21/2019   Procedure: ENDOSCOPIC RETROGRADE CHOLANGIOPANCREATOGRAPHY (ERCP);  Surgeon: Rachael Fee, MD;  Location: [facility] ENDOSCOPY;  Service: Endoscopy;  Laterality: N/A;   HERNIA REPAIR     HIP ARTHROPLASTY Right 10/11/2022   Procedure: ARTHROPLASTY BIPOLAR HIP (HEMIARTHROPLASTY);  Surgeon:  Myrene Galas, MD;  Location: [facility] OR;  Service: Orthopedics;  Laterality: Right;   HOT HEMOSTASIS N/A 09/05/2022   Procedure: HOT HEMOSTASIS (ARGON PLASMA COAGULATION/BICAP);  Surgeon: Lemar Lofty., MD;  Location: [facility] ENDOSCOPY;  Service: Gastroenterology;  Laterality: N/A;   LEFT HEART CATH AND CORONARY ANGIOGRAPHY  04/18/2001   Dr. Riley Kill: EF 60 to 65%.  2+ MR.  Upper LM takeoff with 90 degree bend just inside the ostium.  50-70% mid LAD after D1.  D1 is 30%.    Distal LAD wraps the apex and is free of disease.  LCx is mostly large OM branch.  RCA provides PDA and PL branches and Free of disease. -->  Presumably evaluated by Myoview that was nonischemic, therefore no PCI performed.   NM MYOVIEW LTD  01/2012   Normal EF.  Normal study.  No ischemia or infarction.   PACEMAKER IMPLANT Left 05/15/2016   SJM Assirity MRI dual chamber PPM implanted by Dr Johney Frame for CHB   POLYPECTOMY  09/05/2022   Procedure: POLYPECTOMY;  Surgeon: Mansouraty, Netty Starring., MD;  Location: [facility] ENDOSCOPY;  Service: Gastroenterology;;   REMOVAL OF STONES  07/21/2019   Procedure: REMOVAL OF STONES;  Surgeon: Rachael Fee, MD;  Location: [facility] ENDOSCOPY;  Service: Endoscopy;;   SPHINCTEROTOMY  07/21/2019   Procedure: Dennison Mascot;  Surgeon: Rachael Fee, MD;  Location: [facility] ENDOSCOPY;  Service: Endoscopy;;   THYROIDECTOMY     TONSILLECTOMY AND ADENOIDECTOMY     TOTAL ABDOMINAL HYSTERECTOMY W/ BILATERAL SALPINGOOPHORECTOMY     TRANSTHORACIC ECHOCARDIOGRAM  06/27/2016   Moderate basal-septal LVH, EF 60 to 65%.  No R WMA.  GR 1 DD.  Mild aortic valve calcification.  No AS.  Trivial MR.--Indicated improved EF compared to prior study in 2015.   TRANSTHORACIC ECHOCARDIOGRAM  02/22/2012   12/'13 -a) EF 50 and 55%.  Normal function.-Moderate MR.  Mild LA dilation.; b) 11/2015Echo for shortness of breath => reduced EF of 45 to 50%.  Mild LVH.  Inferior reversible HK.  GRII DD.  Moderate MR.   TRANSTHORACIC ECHOCARDIOGRAM   10/2019   EF 60 -65%.  No RWMA.  Severe concentric LVH.  Unable to assess diastolic function.  Mild biatrial dilation.  Moderate MR moderate TR, no AS   TRANSTHORACIC ECHOCARDIOGRAM  06/15/2021   EF 40 to 45%.  Paradoxical septal motion from pacemaker.  Mild concentric LVH.  Indeterminate diastolic pressure.  Severe biatrial enlargement would argue significant diastolic dysfunction.  Severe MR and severe TR. => EF down from 60 to 65%.     A IV Location/Drains/Wounds Patient Lines/Drains/Airways Status     Active Line/Drains/Airways     Name Placement date Placement time Site Days   Peripheral IV 12/07/22 20 G Anterior;Distal;Right Forearm 12/07/22  1602  Forearm  less than 1   Incision - 4 Ports Abdomen 1: Umbilicus 2: Mid;Upper;Left 3: Right;Lateral;Lower Right;Upper 12/15/19  0837  -- 1088   Wound / Incision (Open or Dehisced) 12/15/19 (MASD) Moisture Associated Skin Damage Pelvis Anterior;Bilateral yeast 12/15/19  1200  Pelvis  1088  Intake/Output Last 24 hours  Intake/Output Summary (Last 24 hours) at 12/07/2022 1907 Last data filed at 12/07/2022 1737 Gross per 24 hour  Intake 1002.68 ml  Output --  Net 1002.68 ml    Labs/Imaging Results for orders placed or performed during the hospital encounter of 12/07/22 (from the past 48 hour(s))  CBC     Status: Abnormal   Collection Time: 12/07/22  3:57 PM  Result Value Ref Range   WBC 7.0 4.0 - 10.5 K/uL   RBC 5.11 3.87 - 5.11 MIL/uL   Hemoglobin 14.9 12.0 - 15.0 g/dL   HCT 44.0 (H) 10.2 - 72.5 %   MCV 91.0 80.0 - 100.0 fL   MCH 29.2 26.0 - 34.0 pg   MCHC 32.0 30.0 - 36.0 g/dL   RDW 36.6 (H) 44.0 - 34.7 %   Platelets 262 150 - 400 K/uL   nRBC 0.0 0.0 - 0.2 %    Comment: Performed at [facility], [address] [place], [place] [address]  Comprehensive metabolic panel     Status: Abnormal   Collection Time: 12/07/22  3:57 PM  Result Value Ref Range   Sodium 134 (L) 135 - 145 mmol/L   Potassium 3.7 3.5  - 5.1 mmol/L   Chloride 91 (L) 98 - 111 mmol/L   CO2 27 22 - 32 mmol/L   Glucose, Bld 127 (H) 70 - 99 mg/dL    Comment: Glucose reference range applies only to samples taken after fasting for at least 8 hours.   BUN 103 (H) 8 - 23 mg/dL   Creatinine, Ser 6.38 (H) 0.44 - 1.00 mg/dL   Calcium 75.6 8.9 - 43.3 mg/dL   Total Protein 8.2 (H) 6.5 - 8.1 g/dL   Albumin 3.4 (L) 3.5 - 5.0 g/dL   AST 27 15 - 41 U/L   ALT 10 0 - 44 U/L   Alkaline Phosphatase 102 38 - 126 U/L   Total Bilirubin 1.1 0.3 - 1.2 mg/dL   GFR, Estimated 32 (L) >60 mL/min    Comment: (NOTE) Calculated using the CKD-EPI Creatinine Equation (2021)    Anion gap 16 (H) 5 - 15    Comment: Performed at [facility], [address], [place], [place] [address]  Magnesium     Status: Abnormal   Collection Time: 12/07/22  3:57 PM  Result Value Ref Range   Magnesium 3.1 (H) 1.7 - 2.4 mg/dL    Comment: Performed at [facility], [address] [place], [place] [address]  Resp panel by RT-PCR (RSV, Flu A&B, Covid) Anterior Nasal Swab     Status: None   Collection Time: 12/07/22  4:18 PM   Specimen: Anterior Nasal Swab  Result Value Ref Range   SARS Coronavirus 2 by RT PCR NEGATIVE NEGATIVE   Influenza A by PCR NEGATIVE NEGATIVE   Influenza B by PCR NEGATIVE NEGATIVE    Comment: (NOTE) The Xpert Xpress SARS-CoV-2/FLU/RSV plus assay is intended as an aid in the diagnosis of influenza from Nasopharyngeal swab specimens and should not be used as a sole basis for treatment. Nasal washings and aspirates are unacceptable for Xpert Xpress SARS-CoV-2/FLU/RSV testing.  Fact Sheet for Patients: BloggerCourse.com  Fact Sheet for Healthcare Providers: SeriousBroker.it  This test is not yet approved or cleared by the [place] FDA and has been authorized for detection and/or diagnosis of SARS-CoV-2 by FDA under an Emergency Use Authorization (EUA). This EUA will  remain in effect (meaning this  test can be used) for the duration of the COVID-19 declaration under Section 564(b)(1) of the Act, 21 U.S.C. section 360bbb-3(b)(1), unless the authorization is terminated or revoked.     Resp Syncytial Virus by PCR NEGATIVE NEGATIVE    Comment: (NOTE) Fact Sheet for Patients: BloggerCourse.com  Fact Sheet for Healthcare Providers: SeriousBroker.it  This test is not yet approved or cleared by the [place] FDA and has been authorized for detection and/or diagnosis of SARS-CoV-2 by FDA under an Emergency Use Authorization (EUA). This EUA will remain in effect (meaning this test can be used) for the duration of the COVID-19 declaration under Section 564(b)(1) of the Act, 21 U.S.C. section 360bbb-3(b)(1), unless the authorization is terminated or revoked.  Performed at [facility], [address] [place], [place] [address]   Urinalysis, Routine w reflex microscopic -Urine, Catheterized     Status: Abnormal   Collection Time: 12/07/22  5:59 PM  Result Value Ref Range   Color, Urine YELLOW YELLOW   APPearance CLEAR CLEAR   Specific Gravity, Urine 1.009 1.005 - 1.030   pH 7.0 5.0 - 8.0   Glucose, UA NEGATIVE NEGATIVE mg/dL   Hgb urine dipstick SMALL (A) NEGATIVE   Bilirubin Urine NEGATIVE NEGATIVE   Ketones, ur NEGATIVE NEGATIVE mg/dL   Protein, ur NEGATIVE NEGATIVE mg/dL   Nitrite NEGATIVE NEGATIVE   Leukocytes,Ua TRACE (A) NEGATIVE   RBC / HPF 0-5 0 - 5 RBC/hpf   WBC, UA 0-5 0 - 5 WBC/hpf   Bacteria, UA NONE SEEN NONE SEEN   Squamous Epithelial / HPF 0-5 0 - 5 /HPF   Hyaline Casts, UA PRESENT     Comment: Performed at [facility], [address] [place], [place] [address]   DG Chest Portable 1 View  Result Date: 12/07/2022 CLINICAL DATA:  Altered mental status. EXAM: PORTABLE CHEST 1 VIEW COMPARISON:  11/13/2022 FINDINGS: Stable cardiomegaly. Left-sided pacemaker in place.  Chronic interstitial coarsening without interval change. Calcified granuloma and mediastinal lymph nodes. No acute airspace disease, large pleural effusion or pneumothorax. Stable osseous structures. IMPRESSION: Stable cardiomegaly. Chronic interstitial coarsening. No acute findings. Electronically Signed   By: Narda Rutherford M.D.   On: 12/07/2022 18:48   CT Head Wo Contrast  Result Date: 12/07/2022 CLINICAL DATA:  Mental status change, unknown cause EXAM: CT HEAD WITHOUT CONTRAST TECHNIQUE: Contiguous axial images were obtained from the base of the skull through the vertex without intravenous contrast. RADIATION DOSE REDUCTION: This exam was performed according to the departmental dose-optimization program which includes automated exposure control, adjustment of the mA and/or kV according to patient size and/or use of iterative reconstruction technique. COMPARISON:  10/09/2022 FINDINGS: Brain: Stable atrophy chronic small vessel ischemia. No intracranial hemorrhage, mass effect, or midline shift. No hydrocephalus. The basilar cisterns are patent. No evidence of territorial infarct or acute ischemia. No extra-axial or intracranial fluid collection. Vascular: Atherosclerosis of skullbase vasculature without hyperdense vessel or abnormal calcification. Skull: No fracture or focal lesion. Sinuses/Orbits: No acute finding. Other: None. IMPRESSION: 1. No acute intracranial abnormality. 2. Stable atrophy and chronic small vessel ischemia. Electronically Signed   By: Narda Rutherford M.D.   On: 12/07/2022 18:47    Pending Labs Unresulted Labs (From admission, onward)    None       Vitals/Pain Today's Vitals   12/07/22 1556 12/07/22 1557 12/07/22 1600 12/07/22 1800  BP:  108/76  122/74  Pulse:  76  62  Resp:  20  20  Temp:  (!)  97.4 F (36.3 C)    TempSrc:  Oral    SpO2:  98%  100%  Weight:   76 kg   Height:   5\' 2"  (1.575 m)   PainSc: 10-Worst pain ever       Isolation Precautions No active  isolations  Medications Medications  sodium chloride 0.9 % bolus 1,000 mL (0 mLs Intravenous Stopped 12/07/22 1737)    Mobility walks with device     Focused Assessments Neuro Assessment Handoff:   Neuro Assessment: Exceptions to WDL   R Recommendations: See Admitting Provider Note  Report given to:   Additional Notes:

## 2022-12-07 NOTE — ED Notes (Signed)
Patient incontinent of stool upon arrival. Patient cleansed and changed. Bed alarm set.

## 2022-12-07 NOTE — ED Triage Notes (Addendum)
Patient presents via EMS from home with weakness and increased confusion x2 days. Per EMS, patient has been repetitive. Per EMS, home health contacted the patient's granddaughter and stated that patient was dehydrated and needed to come to the emergency department. Patient reports that "everything" hurts upon arrival.

## 2022-12-07 NOTE — Telephone Encounter (Signed)
Holly from [facility] called and said they drew the patient's blood and it had concerning results. They showed the patient is in renal failure and Pete Glatter advised the patient's daughter to taker her to the ED. Jeanice Lim would like a call back ASAP at [phone].

## 2022-12-08 DIAGNOSIS — I5042 Chronic combined systolic (congestive) and diastolic (congestive) heart failure: Secondary | ICD-10-CM | POA: Diagnosis not present

## 2022-12-08 DIAGNOSIS — G9341 Metabolic encephalopathy: Secondary | ICD-10-CM | POA: Diagnosis not present

## 2022-12-08 LAB — CBC
HCT: 42.6 % (ref 36.0–46.0)
Hemoglobin: 13.7 g/dL (ref 12.0–15.0)
MCH: 30.1 pg (ref 26.0–34.0)
MCHC: 32.2 g/dL (ref 30.0–36.0)
MCV: 93.6 fL (ref 80.0–100.0)
Platelets: 256 10*3/uL (ref 150–400)
RBC: 4.55 MIL/uL (ref 3.87–5.11)
RDW: 17.1 % — ABNORMAL HIGH (ref 11.5–15.5)
WBC: 6.6 10*3/uL (ref 4.0–10.5)
nRBC: 0 % (ref 0.0–0.2)

## 2022-12-08 LAB — COMPREHENSIVE METABOLIC PANEL
ALT: 11 U/L (ref 0–44)
AST: 26 U/L (ref 15–41)
Albumin: 3 g/dL — ABNORMAL LOW (ref 3.5–5.0)
Alkaline Phosphatase: 97 U/L (ref 38–126)
Anion gap: 11 (ref 5–15)
BUN: 72 mg/dL — ABNORMAL HIGH (ref 8–23)
CO2: 29 mmol/L (ref 22–32)
Calcium: 9.4 mg/dL (ref 8.9–10.3)
Chloride: 98 mmol/L (ref 98–111)
Creatinine, Ser: 1.16 mg/dL — ABNORMAL HIGH (ref 0.44–1.00)
GFR, Estimated: 43 mL/min — ABNORMAL LOW (ref >60)
Glucose, Bld: 194 mg/dL — ABNORMAL HIGH (ref 70–99)
Potassium: 3.4 mmol/L — ABNORMAL LOW (ref 3.5–5.1)
Sodium: 138 mmol/L (ref 135–145)
Total Bilirubin: 0.5 mg/dL (ref 0.3–1.2)
Total Protein: 7 g/dL (ref 6.5–8.1)

## 2022-12-08 MED ORDER — MIDODRINE HCL 5 MG PO TABS
2.5000 mg | ORAL_TABLET | Freq: Three times a day (TID) | ORAL | Status: DC
  Administered 2022-12-08 – 2022-12-10 (×5): 2.5 mg via ORAL
  Filled 2022-12-08 (×5): qty 1

## 2022-12-08 NOTE — Plan of Care (Signed)
Pt has rested quietly throughout the night with no distress noted. Alert and oriented to person and place. On room air. Aflutter on the monitor. [place] intact to suction. Granddaughter at bedside. No complaints voiced.     Problem: Education: Goal: Verbalization of understanding the information provided (i.e., activity precautions, restrictions, etc) will improve Outcome: Progressing Goal: Individualized Educational Video(s) Outcome: Progressing   Problem: Activity: Goal: Ability to ambulate and perform ADLs will improve Outcome: Progressing   Problem: Clinical Measurements: Goal: Postoperative complications will be avoided or minimized Outcome: Progressing   Problem: Self-Concept: Goal: Ability to maintain and perform role responsibilities to the fullest extent possible will improve Outcome: Progressing   Problem: Pain Management: Goal: Pain level will decrease Outcome: Progressing   Problem: Education: Goal: Knowledge of General Education information will improve Description: Including pain rating scale, medication(s)/side effects and non-pharmacologic comfort measures Outcome: Progressing   Problem: Health Behavior/Discharge Planning: Goal: Ability to manage health-related needs will improve Outcome: Progressing   Problem: Clinical Measurements: Goal: Ability to maintain clinical measurements within normal limits will improve Outcome: Progressing Goal: Will remain free from infection Outcome: Progressing Goal: Diagnostic test results will improve Outcome: Progressing Goal: Respiratory complications will improve Outcome: Progressing Goal: Cardiovascular complication will be avoided Outcome: Progressing   Problem: Activity: Goal: Risk for activity intolerance will decrease Outcome: Progressing   Problem: Nutrition: Goal: Adequate nutrition will be maintained Outcome: Progressing   Problem: Coping: Goal: Level of anxiety will decrease Outcome: Progressing    Problem: Elimination: Goal: Will not experience complications related to bowel motility Outcome: Progressing Goal: Will not experience complications related to urinary retention Outcome: Progressing   Problem: Pain Managment: Goal: General experience of comfort will improve Outcome: Progressing   Problem: Safety: Goal: Ability to remain free from injury will improve Outcome: Progressing   Problem: Skin Integrity: Goal: Risk for impaired skin integrity will decrease Outcome: Progressing

## 2022-12-08 NOTE — Plan of Care (Signed)

## 2022-12-08 NOTE — Progress Notes (Addendum)
PROGRESS NOTE    Beverly RICCELLI  Kim:[id number] DOB: [birth year] DOA: 12/07/2022 PCP: Pincus Sanes, MD   Chief Complaint  Patient presents with   Altered Mental Status   Weakness   Dehydration    Brief Narrative:   : Beverly Kim is a 89 y.o. female with medical history significant of essential hypertension, hypothyroidism, hyperlipidemia, paroxysmal atrial fibrillation, sick sinus syndrome, chronic heart failure with preserved ejection fraction, chronic kidney disease stage III was brought in from home with complaint of generalized weakness and poor oral intake.  Patient was seen by her PCP recently in the outpatient setting.  She has blood work done that shows some mild AKI.  She was recently in rehab but got discharged from rehab to home.  She has progressively gotten worse and altered.  Granddaughter brought her to the ER for evaluation.  They initially thought she may have had some UTI.  In the ER however urinalysis was negative.  Acute flu assays negative.  Patient found to have AKI.  Hide BUN has gone up from 19-103 in the last 1 month.  Patient is on 3 diuretics including torsemide, metolazone and Aldactone.  She is also on ACE inhibitor.  Patient has been admitted with generalized weakness, AKI, dehydration and evidence of uremia.    Assessment & Plan:   Principal Problem:   Acute metabolic encephalopathy Active Problems:   Chronic combined systolic and diastolic CHF (congestive heart failure) (HCC)   CKD stage 3a, GFR 45-59 ml/min (HCC)   Hypothyroidism   Hyperlipidemia   MIXED HEARING LOSS BILATERAL   Essential hypertension   Coronary artery disease involving native heart without angina pectoris   GERD  AKI on CKD stage III B Uremia  generalized weakness -This is most likely in the setting of recently increasing her diuresis after discharge from SNF, she was on Lasix and metolazone, she was started on significant dose torsemide, her metolazone dose has been  increased and she was started on Aldactone. -Patient presents with progressive weakness, most recent creatinine 1.07, elevated up to 1.4 on admission.   -Hold all diuresis including Aldactone, torsemide and close on -Continue with IV fluids, monitor closely given history of known chronic diastolic CHF -Will consult PT/OT  Hypertension -Her blood pressure is soft, continue with bisoprolol for heart rate control, will add midodrine  Acute metabolic encephalopathy -This is secondary to dehydration and uremia, as her BUN is 103, CT head with no acute finding, mentation much improved upon my evaluation today  hypothyroidism:  -Continue levothyroxine.  Paroxysmal A-fib -Continue with bisoprolol -He is not on any anticoagulation given history of intolerance in the past, I have discussed this again with granddaughter at bedside, apparently patient had significant nosebleed, feeling sick, and" sense of dying while taking Eliquis", where she is absolutely does not want to go back on it.   hyperlipidemia:  - Continue statin   Essential hypertension: Continue blood pressure medications.   Coronary artery disease: Stable   GERD: Continue with PPIs   Chronic combined systolic and diastolic heart failure: Appears compensated.     DVT prophylaxis:  Code Status: DNR Family Communication: D/W Granddaughter at bedside Disposition:   Status is: Inpatient    Consultants:  None  Subjective:  Patient complains of throat pain, but otherwise denies any complaints.  Objective: Vitals:   12/07/22 1915 12/07/22 2021 12/08/22 0145 12/08/22 0454  BP:  (!) 126/58 102/62 (!) 108/44  Pulse: 67 68 63 62  Resp: 17 18  19 20  Temp:  97.8 F (36.6 C) 98.3 F (36.8 C) 97.8 F (36.6 C)  TempSrc:  Oral Oral Oral  SpO2: 96% 100% 97% 96%  Weight:  63.4 kg    Height:  5' 2.5" (1.588 m)      Intake/Output Summary (Last 24 hours) at 12/08/2022 1311 Last data filed at 12/08/2022 0600 Gross per 24  hour  Intake 1242.68 ml  Output 500 ml  Net 742.68 ml   Filed Weights   12/07/22 1600 12/07/22 2021  Weight: 76 kg 63.4 kg    Examination:  Awake Alert, frail, extremely hard of hearing Symmetrical Chest wall movement, Good air movement bilaterally, CTAB RRR,No Gallops,Rubs or new Murmurs, No Parasternal Heave +ve B.Sounds, Abd Soft, No tenderness, No rebound - guarding or rigidity. No Cyanosis, Clubbing or edema, No new Rash or bruise      Data Reviewed: I have personally reviewed following labs and imaging studies  CBC: Recent Labs  Lab 12/07/22 1557 12/07/22 2123  WBC 7.0 6.4  HGB 14.9 14.0  HCT 46.5* 43.2  MCV 91.0 89.6  PLT 262 255    Basic Metabolic Panel: Recent Labs  Lab 12/07/22 1557 12/07/22 2123  NA 134*  --   K 3.7  --   CL 91*  --   CO2 27  --   GLUCOSE 127*  --   BUN 103*  --   CREATININE 1.49* 1.37*  CALCIUM 10.1  --   MG 3.1*  --     GFR: Estimated Creatinine Clearance: 21.3 mL/min (A) (by C-G formula based on SCr of 1.37 mg/dL (H)).  Liver Function Tests: Recent Labs  Lab 12/07/22 1557  AST 27  ALT 10  ALKPHOS 102  BILITOT 1.1  PROT 8.2*  ALBUMIN 3.4*    CBG: No results for input(s): "GLUCAP" in the last 168 hours.   Recent Results (from the past 240 hour(s))  Resp panel by RT-PCR (RSV, Flu A&B, Covid) Anterior Nasal Swab     Status: None   Collection Time: 12/07/22  4:18 PM   Specimen: Anterior Nasal Swab  Result Value Ref Range Status   SARS Coronavirus 2 by RT PCR NEGATIVE NEGATIVE Final   Influenza A by PCR NEGATIVE NEGATIVE Final   Influenza B by PCR NEGATIVE NEGATIVE Final    Comment: (NOTE) The Xpert Xpress SARS-CoV-2/FLU/RSV plus assay is intended as an aid in the diagnosis of influenza from Nasopharyngeal swab specimens and should not be used as a sole basis for treatment. Nasal washings and aspirates are unacceptable for Xpert Xpress SARS-CoV-2/FLU/RSV testing.  Fact Sheet for  Patients: BloggerCourse.com  Fact Sheet for Healthcare Providers: SeriousBroker.it  This test is not yet approved or cleared by the [place] FDA and has been authorized for detection and/or diagnosis of SARS-CoV-2 by FDA under an Emergency Use Authorization (EUA). This EUA will remain in effect (meaning this test can be used) for the duration of the COVID-19 declaration under Section 564(b)(1) of the Act, 21 U.S.C. section 360bbb-3(b)(1), unless the authorization is terminated or revoked.     Resp Syncytial Virus by PCR NEGATIVE NEGATIVE Final    Comment: (NOTE) Fact Sheet for Patients: BloggerCourse.com  Fact Sheet for Healthcare Providers: SeriousBroker.it  This test is not yet approved or cleared by the [place] FDA and has been authorized for detection and/or diagnosis of SARS-CoV-2 by FDA under an Emergency Use Authorization (EUA). This EUA will remain in effect (meaning this test can be used) for the  duration of the COVID-19 declaration under Section 564(b)(1) of the Act, 21 U.S.C. section 360bbb-3(b)(1), unless the authorization is terminated or revoked.  Performed at [facility], [address], [place], [place] [address]          Radiology Studies: DG Chest Portable 1 View  Result Date: 12/07/2022 CLINICAL DATA:  Altered mental status. EXAM: PORTABLE CHEST 1 VIEW COMPARISON:  11/13/2022 FINDINGS: Stable cardiomegaly. Left-sided pacemaker in place. Chronic interstitial coarsening without interval change. Calcified granuloma and mediastinal lymph nodes. No acute airspace disease, large pleural effusion or pneumothorax. Stable osseous structures. IMPRESSION: Stable cardiomegaly. Chronic interstitial coarsening. No acute findings. Electronically Signed   By: Narda Rutherford M.D.   On: 12/07/2022 18:48   CT Head Wo Contrast  Result Date:  12/07/2022 CLINICAL DATA:  Mental status change, unknown cause EXAM: CT HEAD WITHOUT CONTRAST TECHNIQUE: Contiguous axial images were obtained from the base of the skull through the vertex without intravenous contrast. RADIATION DOSE REDUCTION: This exam was performed according to the departmental dose-optimization program which includes automated exposure control, adjustment of the mA and/or kV according to patient size and/or use of iterative reconstruction technique. COMPARISON:  10/09/2022 FINDINGS: Brain: Stable atrophy chronic small vessel ischemia. No intracranial hemorrhage, mass effect, or midline shift. No hydrocephalus. The basilar cisterns are patent. No evidence of territorial infarct or acute ischemia. No extra-axial or intracranial fluid collection. Vascular: Atherosclerosis of skullbase vasculature without hyperdense vessel or abnormal calcification. Skull: No fracture or focal lesion. Sinuses/Orbits: No acute finding. Other: None. IMPRESSION: 1. No acute intracranial abnormality. 2. Stable atrophy and chronic small vessel ischemia. Electronically Signed   By: Narda Rutherford M.D.   On: 12/07/2022 18:47        Scheduled Meds:  acetaminophen  650 mg Oral Q8H   bisoprolol  2.5 mg Oral Daily   diclofenac Sodium  4 g Topical QID   docusate sodium  100 mg Oral BID   feeding supplement  1 Bottle Oral BID BM   heparin  5,000 Units Subcutaneous Q8H   levothyroxine  112 mcg Oral QAC breakfast   lidocaine  1 patch Transdermal Q24H   multivitamin with minerals  1 tablet Oral Daily   pantoprazole  40 mg Oral Daily   polyethylene glycol  17 g Oral BID   senna  34.4 mg Oral Daily   traZODone  25 mg Oral QHS   Continuous Infusions:  sodium chloride 50 mL/hr at 12/07/22 2204     LOS: 1 day       Huey Bienenstock, MD [facility]   To contact the attending provider between 7A-7P or the covering provider during after hours 7P-7A, please log into the web site [email or web address] and  access using universal [place] password for that web site. If you do not have the password, please call the hospital operator.  12/08/2022, 1:11 PM

## 2022-12-09 ENCOUNTER — Inpatient Hospital Stay (HOSPITAL_COMMUNITY): Payer: PPO

## 2022-12-09 DIAGNOSIS — E860 Dehydration: Secondary | ICD-10-CM | POA: Diagnosis not present

## 2022-12-09 DIAGNOSIS — G9341 Metabolic encephalopathy: Secondary | ICD-10-CM | POA: Diagnosis not present

## 2022-12-09 DIAGNOSIS — I5042 Chronic combined systolic (congestive) and diastolic (congestive) heart failure: Secondary | ICD-10-CM | POA: Diagnosis not present

## 2022-12-09 LAB — CBC
HCT: 40.9 % (ref 36.0–46.0)
Hemoglobin: 12.9 g/dL (ref 12.0–15.0)
MCH: 28.8 pg (ref 26.0–34.0)
MCHC: 31.5 g/dL (ref 30.0–36.0)
MCV: 91.3 fL (ref 80.0–100.0)
Platelets: 228 10*3/uL (ref 150–400)
RBC: 4.48 MIL/uL (ref 3.87–5.11)
RDW: 17 % — ABNORMAL HIGH (ref 11.5–15.5)
WBC: 6.8 10*3/uL (ref 4.0–10.5)
nRBC: 0 % (ref 0.0–0.2)

## 2022-12-09 LAB — BASIC METABOLIC PANEL
Anion gap: 12 (ref 5–15)
BUN: 55 mg/dL — ABNORMAL HIGH (ref 8–23)
CO2: 24 mmol/L (ref 22–32)
Calcium: 9.3 mg/dL (ref 8.9–10.3)
Chloride: 102 mmol/L (ref 98–111)
Creatinine, Ser: 0.92 mg/dL (ref 0.44–1.00)
GFR, Estimated: 57 mL/min — ABNORMAL LOW (ref >60)
Glucose, Bld: 109 mg/dL — ABNORMAL HIGH (ref 70–99)
Potassium: 3.4 mmol/L — ABNORMAL LOW (ref 3.5–5.1)
Sodium: 138 mmol/L (ref 135–145)

## 2022-12-09 MED ORDER — POTASSIUM CHLORIDE CRYS ER 20 MEQ PO TBCR
40.0000 meq | EXTENDED_RELEASE_TABLET | Freq: Once | ORAL | Status: AC
  Administered 2022-12-09: 40 meq via ORAL
  Filled 2022-12-09: qty 2

## 2022-12-09 NOTE — Plan of Care (Signed)
  Problem: Pain Management: Goal: Pain level will decrease Outcome: Progressing   Problem: Clinical Measurements: Goal: Will remain free from infection Outcome: Progressing   Problem: Safety: Goal: Ability to remain free from injury will improve Outcome: Progressing

## 2022-12-09 NOTE — Progress Notes (Signed)
PROGRESS NOTE    Beverly Kim  WUJ:[id number] DOB: 07/27/1924 DOA: 12/07/2022 PCP: Pincus Sanes, MD   Chief Complaint  Patient presents with   Altered Mental Status   Weakness   Dehydration    Brief Narrative:   : Beverly Kim is a 89 y.o. female with medical history significant of essential hypertension, hypothyroidism, hyperlipidemia, paroxysmal atrial fibrillation, sick sinus syndrome, chronic heart failure with preserved ejection fraction, chronic kidney disease stage III was brought in from home with complaint of generalized weakness and poor oral intake.  Patient was seen by her PCP recently in the outpatient setting.  She has blood work done that shows some mild AKI.  She was recently in rehab but got discharged from rehab to home.  She has progressively gotten worse and altered.  Granddaughter brought her to the ER for evaluation.  They initially thought she may have had some UTI.  In the ER however urinalysis was negative.  Acute flu assays negative.  Patient found to have AKI.  Hide BUN has gone up from 19-103 in the last 1 month.  Patient is on 3 diuretics including torsemide, metolazone and Aldactone.  She is also on ACE inhibitor.  Patient has been admitted with generalized weakness, AKI, dehydration and evidence of uremia.    Assessment & Plan:   Principal Problem:   Acute metabolic encephalopathy Active Problems:   Chronic combined systolic and diastolic CHF (congestive heart failure) (HCC)   CKD stage 3a, GFR 45-59 ml/min (HCC)   Hypothyroidism   Hyperlipidemia   MIXED HEARING LOSS BILATERAL   Essential hypertension   Coronary artery disease involving native heart without angina pectoris   GERD  AKI on CKD stage III B Uremia  generalized weakness -This is most likely in the setting of recently increasing her diuresis after discharge from SNF, she was on Lasix and metolazone, she was started on significant dose torsemide, her metolazone dose has been  increased and she was started on Aldactone. -Patient presents with progressive weakness, most recent creatinine 1.07, elevated up to 1.4 on admission.   -Hold all diuresis including Aldactone, torsemide and close on -Continue with IV fluids, monitor closely given history of known chronic diastolic CHF -PT/OT consulted -Family request her primary cardiologist to be involved in her care guarding decision of diuresis resumption, so I will place official consult for cardiology tomorrow.  Hypertension -Her blood pressure is soft so I have added midodrine for now as she remains on bisoprolol for heart rate control.  Acute metabolic encephalopathy -This is secondary to dehydration and uremia, as her BUN is 103, CT head with no acute finding, mentation much improved upon my evaluation today  hypothyroidism:  -Continue levothyroxine.  Paroxysmal A-fib -Continue with bisoprolol -He is not on any anticoagulation given history of intolerance in the past, I have discussed this again with granddaughter at bedside, apparently patient had significant nosebleed, feeling sick, and" sense of dying while taking Eliquis", where she is absolutely does not want to go back on it.   hyperlipidemia:  - Continue statin   Essential hypertension: Continue blood pressure medications.   Coronary artery disease: Stable   GERD: Continue with PPIs   Chronic combined systolic and diastolic heart failure: Most recent EF 40 to 45% -, she appears compensated, actually she appears to be on the right side, so certainly diuresis is held, she is on IV fluids, will monitor closely for volume overload, diuretics has been held including torsemide, Zaroxolyn, as well  Aldactone on hold given AKI, patient is followed by cardiology Dr. Herbie [place], family do request cardiology to get involved about her cardiac medications, and diuresis, so official consult will be placed tomorrow.     DVT prophylaxis:  Code Status: DNR Family  Communication: None at bedside Disposition:   Status is: Inpatient    Consultants:  None  Subjective:  She denies any complaints today, reports she is feeling better.  Objective: Vitals:   12/08/22 2000 12/09/22 0000 12/09/22 0400 12/09/22 0845  BP: (!) 127/53  (!) 124/50 (!) 134/50  Pulse: 64  67 63  Resp: 19  16 17   Temp: 97.9 F (36.6 C) 97.7 F (36.5 C) 97.6 F (36.4 C) 98 F (36.7 C)  TempSrc: Oral Axillary Axillary Oral  SpO2: 97%  94% 97%  Weight:      Height:        Intake/Output Summary (Last 24 hours) at 12/09/2022 1306 Last data filed at 12/09/2022 0300 Gross per 24 hour  Intake 1656.02 ml  Output --  Net 1656.02 ml   Filed Weights   12/07/22 1600 12/07/22 2021  Weight: 76 kg 63.4 kg    Examination:  Awake Alert, frail, extremely hard of hearing Symmetrical Chest wall movement, Good air movement bilaterally, CTAB RRR,No Gallops,Rubs or new Murmurs, No Parasternal Heave +ve B.Sounds, Abd Soft, No tenderness, No rebound - guarding or rigidity. No Cyanosis, Clubbing or edema, No new Rash or bruise      Data Reviewed: I have personally reviewed following labs and imaging studies  CBC: Recent Labs  Lab 12/07/22 1557 12/07/22 2123 12/08/22 1554 12/09/22 0443  WBC 7.0 6.4 6.6 6.8  HGB 14.9 14.0 13.7 12.9  HCT 46.5* 43.2 42.6 40.9  MCV 91.0 89.6 93.6 91.3  PLT 262 255 256 228    Basic Metabolic Panel: Recent Labs  Lab 12/07/22 1557 12/07/22 2123 12/08/22 1554 12/09/22 0443  NA 134*  --  138 138  K 3.7  --  3.4* 3.4*  CL 91*  --  98 102  CO2 27  --  29 24  GLUCOSE 127*  --  194* 109*  BUN 103*  --  72* 55*  CREATININE 1.49* 1.37* 1.16* 0.92  CALCIUM 10.1  --  9.4 9.3  MG 3.1*  --   --   --     GFR: Estimated Creatinine Clearance: 31.7 mL/min (by C-G formula based on SCr of 0.92 mg/dL).  Liver Function Tests: Recent Labs  Lab 12/07/22 1557 12/08/22 1554  AST 27 26  ALT 10 11  ALKPHOS 102 97  BILITOT 1.1 0.5  PROT 8.2*  7.0  ALBUMIN 3.4* 3.0*    CBG: No results for input(s): "GLUCAP" in the last 168 hours.   Recent Results (from the past 240 hour(s))  Resp panel by RT-PCR (RSV, Flu A&B, Covid) Anterior Nasal Swab     Status: None   Collection Time: 12/07/22  4:18 PM   Specimen: Anterior Nasal Swab  Result Value Ref Range Status   SARS Coronavirus 2 by RT PCR NEGATIVE NEGATIVE Final   Influenza A by PCR NEGATIVE NEGATIVE Final   Influenza B by PCR NEGATIVE NEGATIVE Final    Comment: (NOTE) The Xpert Xpress SARS-CoV-2/FLU/RSV plus assay is intended as an aid in the diagnosis of influenza from Nasopharyngeal swab specimens and should not be used as a sole basis for treatment. Nasal washings and aspirates are unacceptable for Xpert Xpress SARS-CoV-2/FLU/RSV testing.  Fact Sheet for Patients: BloggerCourse.com  Fact  Sheet for Healthcare Providers: SeriousBroker.it  This test is not yet approved or cleared by the [place] and has been authorized for detection and/or diagnosis of SARS-CoV-2 by FDA under an Emergency Use Authorization (EUA). This EUA will remain in effect (meaning this test can be used) for the duration of the COVID-19 declaration under Section 564(b)(1) of the Act, 21 U.S.C. section 360bbb-3(b)(1), unless the authorization is terminated or revoked.     Resp Syncytial Virus by PCR NEGATIVE NEGATIVE Final    Comment: (NOTE) Fact Sheet for Patients: BloggerCourse.com  Fact Sheet for Healthcare Providers: SeriousBroker.it  This test is not yet approved or cleared by the [place] FDA and has been authorized for detection and/or diagnosis of SARS-CoV-2 by FDA under an Emergency Use Authorization (EUA). This EUA will remain in effect (meaning this test can be used) for the duration of the COVID-19 declaration under Section 564(b)(1) of the Act, 21 U.S.C. section  360bbb-3(b)(1), unless the authorization is terminated or revoked.  Performed at [facility], [address], [place], [place] [address]          Radiology Studies: DG Chest Portable 1 View  Result Date: 12/07/2022 CLINICAL DATA:  Altered mental status. EXAM: PORTABLE CHEST 1 VIEW COMPARISON:  11/13/2022 FINDINGS: Stable cardiomegaly. Left-sided pacemaker in place. Chronic interstitial coarsening without interval change. Calcified granuloma and mediastinal lymph nodes. No acute airspace disease, large pleural effusion or pneumothorax. Stable osseous structures. IMPRESSION: Stable cardiomegaly. Chronic interstitial coarsening. No acute findings. Electronically Signed   By: Narda Rutherford M.D.   On: 12/07/2022 18:48   CT Head Wo Contrast  Result Date: 12/07/2022 CLINICAL DATA:  Mental status change, unknown cause EXAM: CT HEAD WITHOUT CONTRAST TECHNIQUE: Contiguous axial images were obtained from the base of the skull through the vertex without intravenous contrast. RADIATION DOSE REDUCTION: This exam was performed according to the departmental dose-optimization program which includes automated exposure control, adjustment of the mA and/or kV according to patient size and/or use of iterative reconstruction technique. COMPARISON:  10/09/2022 FINDINGS: Brain: Stable atrophy chronic small vessel ischemia. No intracranial hemorrhage, mass effect, or midline shift. No hydrocephalus. The basilar cisterns are patent. No evidence of territorial infarct or acute ischemia. No extra-axial or intracranial fluid collection. Vascular: Atherosclerosis of skullbase vasculature without hyperdense vessel or abnormal calcification. Skull: No fracture or focal lesion. Sinuses/Orbits: No acute finding. Other: None. IMPRESSION: 1. No acute intracranial abnormality. 2. Stable atrophy and chronic small vessel ischemia. Electronically Signed   By: Narda Rutherford M.D.   On: 12/07/2022 18:47        Scheduled  Meds:  acetaminophen  650 mg Oral Q8H   bisoprolol  2.5 mg Oral Daily   diclofenac Sodium  4 g Topical QID   docusate sodium  100 mg Oral BID   feeding supplement  1 Bottle Oral BID BM   heparin  5,000 Units Subcutaneous Q8H   levothyroxine  112 mcg Oral QAC breakfast   lidocaine  1 patch Transdermal Q24H   midodrine  2.5 mg Oral TID WC   multivitamin with minerals  1 tablet Oral Daily   pantoprazole  40 mg Oral Daily   polyethylene glycol  17 g Oral BID   senna  34.4 mg Oral Daily   traZODone  25 mg Oral QHS   Continuous Infusions:  sodium chloride 50 mL/hr at 12/08/22 1820     LOS: 2 days       Huey Bienenstock, MD [facility]  To contact the attending provider between 7A-7P or the covering provider during after hours 7P-7A, please log into the web site [email or web address] and access using universal [place] password for that web site. If you do not have the password, please call the hospital operator.  12/09/2022, 1:06 PM

## 2022-12-09 NOTE — Evaluation (Signed)
Physical Therapy Evaluation Patient Details Name: Beverly Kim MRN: [id number] DOB: October 28, 1924 Today's Date: 12/09/2022  History of Present Illness  The pt is a 89 yo female who presented 12/07/22 due to AMS, weakness and dehydration. Pt was admitted due to AKI and acute encephalopathy. PMH includes: anemia, pacemaker, CKD III, HFpEF, hearing loss, HLD, HTN, hypothyroidism, low back pain, LBBB, and PAF, fall 8/24 with R hip pain with s/p unipolar hemiarthroplasty of R hip on 10/11/22.  Clinical Impression  Pt is presenting near baseline level of functioning. Pt was limited by cognition today. Pt daughter states that pt has been working on stand pivot transfers with HHPT from EOB to W/C. Pt was sitting up in W/C more but due to hemorrhoids has spend more time in bed.  Pt lives with granddaughter who cares for her 24/7 and has a Comptroller when not available. Due to pt current functional status, home set up and available assistance at home recommending skilled physical therapy services 3x/weekly on discharge from acute care hospital setting with 24/7 physical assistance in order to decrease risk for pressure ulcers, immobility, falls, injury and re-hospitalization.       If plan is discharge home, recommend the following: Two people to help with walking and/or transfers;Assistance with cooking/housework;Assist for transportation     Equipment Recommendations Hoyer lift     Functional Status Assessment Patient has had a recent decline in their functional status and/or demonstrates limited ability to make significant improvements in function in a reasonable and predictable amount of time     Precautions / Restrictions Precautions Precautions: Fall Precaution Comments: Per report they fell out of bed vs fall at bedside Restrictions Weight Bearing Restrictions: No      Mobility  Bed Mobility Overal bed mobility: Needs Assistance Bed Mobility: Rolling Rolling: Total assist, +2 for physical  assistance         General bed mobility comments: Total A +2 for rolling R/L able to assist < 25 % for crossing L LE over RLE for rolling R. Requires assist for crossing legs the other way. Has been bed bound at home working on squat pivots with HHPT. Able to stay on side at SBA once in side lying using rail in order to allow pericare and bathe with nursing/physical therapy            Pertinent Vitals/Pain Pain Assessment Pain Assessment: Faces Faces Pain Scale: Hurts worst Pain Location: R ankle Pain Descriptors / Indicators: Discomfort, Guarding, Grimacing, Crying Pain Intervention(s): Monitored during session, Limited activity within patient's tolerance, Repositioned    Home Living Family/patient expects to be discharged to:: Private residence Living Arrangements: Other relatives Available Help at Discharge: Family;Available 24 hours/day (Has help 24/7 and sitter comes in when family is gone) Type of Home: House Home Access: Stairs to enter Entrance Stairs-Rails: Left Entrance Stairs-Number of Steps: 1   Home Layout: One level Home Equipment: Rollator (4 wheels);Rolling Walker (2 wheels);Shower seat;Grab bars - toilet;Grab bars - tub/shower;Toilet riser;Lift chair;Wheelchair - manual Additional Comments: Daughter works from home    Prior Function Prior Level of Function : Needs assist             Mobility Comments: Pt has been using W/C since fall in August and spending alot of time in the bed. Her granddaughter has been turning her every 2 hours. Has physical therapy that has been working with pt on stand pivot transfers with physical assist to W/C ADLs Comments: granddaughter sponge bathes, manages medications, changes brief,  pt is total A     Extremity/Trunk Assessment   Upper Extremity Assessment Upper Extremity Assessment: Defer to OT evaluation    Lower Extremity Assessment Lower Extremity Assessment: RLE deficits/detail RLE Deficits / Details: Reporting  pain in her ankle. Daughter believes it is related to neuropathy and states it has been going on for years.    Cervical / Trunk Assessment Cervical / Trunk Assessment: Kyphotic  Communication   Communication Communication: Hearing impairment;Difficulty following commands/understanding Following commands: Follows one step commands inconsistently Cueing Techniques: Verbal cues;Gestural cues;Tactile cues;Visual cues  Cognition Arousal: Alert Behavior During Therapy: WFL for tasks assessed/performed Overall Cognitive Status: Difficult to assess       General Comments: Pt unable to answer questions adequately. Spoke with daughter for prior level of function        General Comments General comments (skin integrity, edema, etc.): No noted skin issues. Pt was incontinent when entered room. Nursing was assisted with clean up while pt participated in bed mobility.        Assessment/Plan    PT Assessment Patient needs continued PT services  PT Problem List Decreased strength;Decreased mobility;Pain;Decreased activity tolerance       PT Treatment Interventions DME instruction;Therapeutic exercise;Wheelchair mobility training;Functional mobility training;Therapeutic activities;Patient/family education;Neuromuscular re-education;Balance training    PT Goals (Current goals can be found in the Care Plan section)  Acute Rehab PT Goals Patient Stated Goal: To return to Granddaughters home with HHPT PT Goal Formulation: With family Time For Goal Achievement: 12/23/22 Potential to Achieve Goals: Fair    Frequency Min 1X/week        AM-PAC PT "6 Clicks" Mobility  Outcome Measure Help needed turning from your back to your side while in a flat bed without using bedrails?: Total Help needed moving from lying on your back to sitting on the side of a flat bed without using bedrails?: Total Help needed moving to and from a bed to a chair (including a wheelchair)?: Total Help needed standing  up from a chair using your arms (e.g., wheelchair or bedside chair)?: Total Help needed to walk in hospital room?: Total Help needed climbing 3-5 steps with a railing? : Total 6 Click Score: 6    End of Session   Activity Tolerance: Other (comment) (pt limited by cognition) Patient left: in bed;with call bell/phone within reach;with nursing/sitter in room;with bed alarm set Nurse Communication: Mobility status;Need for lift equipment PT Visit Diagnosis: Other abnormalities of gait and mobility (R26.89)    Time: 0737-1062 PT Time Calculation (min) (ACUTE ONLY): 28 min   Charges:   PT Evaluation $PT Eval Low Complexity: 1 Low PT Treatments $Therapeutic Activity: 8-22 mins PT General Charges $$ ACUTE PT VISIT: 1 Visit         Harrel Carina, DPT, CLT  Acute Rehabilitation Services Office: [phone] (Secure chat preferred)   Claudia Desanctis 12/09/2022, 4:43 PM

## 2022-12-09 NOTE — Progress Notes (Signed)
1250- PT notified RN that patient was complaining about right ankle pain. Scheduled tylenol given. Upon assessment 1500- RN and PT bathed patient and changed linen. Patient was anxious about staff touching right foot. Patient tearful even. Staff was gentle while changing patient and notified Dr. Randol Kern. Xray ordered. 1530- RN called patient's daughter, [name], upon request. [name] and patient discussed foot pain and decided they did not want to pursue an x ray. Dr. Randol Kern made aware.

## 2022-12-09 NOTE — Evaluation (Addendum)
Occupational Therapy Evaluation Patient Details Name: Beverly Kim MRN: [id number] DOB: May 18, 1924 Today's Date: 12/09/2022   History of Present Illness The pt is a 89 yo female who presented 12/07/22 due to AMS, weakness and dehydration. Pt was admitted due to AKI and acute encephalopathy. PMH includes: anemia, pacemaker, CKD III, HFpEF, hearing loss, HLD, HTN, hypothyroidism, low back pain, LBBB, and PAF, fall 8/24 with R hip pain with s/p unipolar hemiarthroplasty of R hip on 10/11/22.   Clinical Impression   Beverly Kim became worried about were her family was and they were able to report they probably went to church and started to relax. She reported she can not do much today as her R ankle was hurting her but agreed to attempt to move BLE with moderate assist at bed level. Spoke to pt about having therapy post discharge from hospital but they were uncertain at this time. Patient will benefit from continued inpatient follow up therapy, <3 hours/day       If plan is discharge home, recommend the following: Two people to help with walking and/or transfers;Two people to help with bathing/dressing/bathroom;Assistance with cooking/housework;Assist for transportation;Direct supervision/assist for medications management;Direct supervision/assist for financial management;Help with stairs or ramp for entrance    Functional Status Assessment  Patient has had a recent decline in their functional status and demonstrates the ability to make significant improvements in function in a reasonable and predictable amount of time.  Equipment Recommendations   (TBD)    Recommendations for Other Services       Precautions / Restrictions Precautions Precautions: Fall Precaution Comments: Per report they fell out of bed vs fall at bedside Restrictions per chart R hemiarthoplasty with posterior hip precautions 8/24 Weight Bearing Restrictions: No      Mobility Bed Mobility               General  bed mobility comments: Pt deffered mobility at this time    Transfers Overall transfer level:  (Pt deffered mobility at this time)                        Balance                                           ADL either performed or assessed with clinical judgement   ADL Overall ADL's : Needs assistance/impaired Eating/Feeding: Independent;Sitting   Grooming: Wash/dry hands;Wash/dry face;Set up;Bed level   Upper Body Bathing: Minimal assistance;Moderate assistance;Bed level   Lower Body Bathing: Maximal assistance;Total assistance;Bed level   Upper Body Dressing : Minimal assistance;Moderate assistance;Bed level   Lower Body Dressing: Maximal assistance;Total assistance;Bed level                 General ADL Comments: pt reported they could not complete any transfers at this time due to r ankle pain     Vision Baseline Vision/History: 1 Wears glasses Ability to See in Adequate Light: 0 Adequate Patient Visual Report: No change from baseline Vision Assessment?: No apparent visual deficits     Perception         Praxis         Pertinent Vitals/Pain Pain Assessment Pain Assessment: Faces Faces Pain Scale: Hurts little more Pain Location: R ankle Pain Descriptors / Indicators: Discomfort Pain Intervention(s): Limited activity within patient's tolerance, Monitored during session, Repositioned     Extremity/Trunk  Assessment Upper Extremity Assessment Upper Extremity Assessment: Generalized weakness;RUE deficits/detail;LUE deficits/detail;Right hand dominant RUE Deficits / Details: WFL but noted very fatigued even rasing arms to 90 degrees in forward flexion RUE Sensation: WNL RUE Coordination: WNL LUE Deficits / Details: noted less than 90degrees in forward flexion but also reporting on how weak she is   Lower Extremity Assessment Lower Extremity Assessment: Defer to PT evaluation   Cervical / Trunk Assessment Cervical / Trunk  Assessment: Kyphotic   Communication Communication Communication: Hearing impairment;Difficulty following commands/understanding Following commands: Follows one step commands consistently   Cognition Arousal: Alert Behavior During Therapy: WFL for tasks assessed/performed Overall Cognitive Status: Difficult to assess                                 General Comments: Pt reporting that they feel like they can not get sentances together like they normally do.     General Comments       Exercises     Shoulder Instructions      Home Living Family/patient expects to be discharged to:: Private residence Living Arrangements: Other relatives (granddaughter) Available Help at Discharge: Family;Available PRN/intermittently Type of Home: House Home Access: Stairs to enter [employer] of Steps: 1 Entrance Stairs-Rails: Left Home Layout: One level     Bathroom Shower/Tub: Producer, television/film/video: Standard Bathroom Accessibility: Yes   Home Equipment: Rollator (4 wheels);Rolling Walker (2 wheels);Shower seat;Grab bars - toilet;Grab bars - tub/shower;Toilet riser   Additional Comments: Daughter works from home      Prior Functioning/Environment Prior Level of Function : Independent/Modified Independent             Mobility Comments: uses rollator, uses lift chair          OT Problem List: Decreased strength;Decreased range of motion;Decreased activity tolerance;Impaired balance (sitting and/or standing);Decreased safety awareness;Decreased knowledge of use of DME or AE;Cardiopulmonary status limiting activity;Pain      OT Treatment/Interventions: Self-care/ADL training;Therapeutic activities;Patient/family education;Balance training    OT Goals(Current goals can be found in the care plan section) Acute Rehab OT Goals Patient Stated Goal: to do more therapy anoter day OT Goal Formulation: With patient Time For Goal Achievement:  12/23/22 Potential to Achieve Goals: Good  OT Frequency: Min 1X/week    Co-evaluation              AM-PAC OT "6 Clicks" Daily Activity     Outcome Measure Help from another person eating meals?: None Help from another person taking care of personal grooming?: A Little Help from another person toileting, which includes using toliet, bedpan, or urinal?: A Lot Help from another person bathing (including washing, rinsing, drying)?: A Lot Help from another person to put on and taking off regular upper body clothing?: A Little Help from another person to put on and taking off regular lower body clothing?: A Lot 6 Click Score: 16   End of Session Nurse Communication: Mobility status  Activity Tolerance: Patient limited by pain Patient left: in bed;with call bell/phone within reach;with bed alarm set  OT Visit Diagnosis: Unsteadiness on feet (R26.81);Other abnormalities of gait and mobility (R26.89);Repeated falls (R29.6);Muscle weakness (generalized) (M62.81);Pain Pain - Right/Left: Right Pain - part of body: Ankle and joints of foot                Time: 1231-1250 OT Time Calculation (min): 19 min Charges:  OT General Charges $OT Visit: 1 Visit  OT Evaluation $OT Eval Low Complexity: 1 Low  Presley Raddle OTR/L  Acute Rehab Services  [phone] office number   Alphia Moh 12/09/2022, 1:01 PM

## 2022-12-10 ENCOUNTER — Other Ambulatory Visit: Payer: Self-pay | Admitting: Physician Assistant

## 2022-12-10 DIAGNOSIS — N170 Acute kidney failure with tubular necrosis: Secondary | ICD-10-CM | POA: Diagnosis not present

## 2022-12-10 DIAGNOSIS — I4821 Permanent atrial fibrillation: Secondary | ICD-10-CM

## 2022-12-10 DIAGNOSIS — G9341 Metabolic encephalopathy: Secondary | ICD-10-CM | POA: Diagnosis not present

## 2022-12-10 DIAGNOSIS — E782 Mixed hyperlipidemia: Secondary | ICD-10-CM

## 2022-12-10 DIAGNOSIS — N1831 Chronic kidney disease, stage 3a: Secondary | ICD-10-CM | POA: Diagnosis not present

## 2022-12-10 DIAGNOSIS — I5042 Chronic combined systolic (congestive) and diastolic (congestive) heart failure: Secondary | ICD-10-CM | POA: Diagnosis not present

## 2022-12-10 DIAGNOSIS — I2510 Atherosclerotic heart disease of native coronary artery without angina pectoris: Secondary | ICD-10-CM

## 2022-12-10 LAB — CBC
HCT: 38.2 % (ref 36.0–46.0)
Hemoglobin: 12.2 g/dL (ref 12.0–15.0)
MCH: 29.7 pg (ref 26.0–34.0)
MCHC: 31.9 g/dL (ref 30.0–36.0)
MCV: 92.9 fL (ref 80.0–100.0)
Platelets: 213 10*3/uL (ref 150–400)
RBC: 4.11 MIL/uL (ref 3.87–5.11)
RDW: 17.2 % — ABNORMAL HIGH (ref 11.5–15.5)
WBC: 7.9 10*3/uL (ref 4.0–10.5)
nRBC: 0 % (ref 0.0–0.2)

## 2022-12-10 LAB — URINALYSIS, COMPLETE (UACMP) WITH MICROSCOPIC
Bilirubin Urine: NEGATIVE
Glucose, UA: NEGATIVE mg/dL
Ketones, ur: NEGATIVE mg/dL
Nitrite: NEGATIVE
Protein, ur: 100 mg/dL — AB
Specific Gravity, Urine: 1.019 (ref 1.005–1.030)
WBC, UA: >50 WBC/hpf (ref 0–5)
pH: 7 (ref 5.0–8.0)

## 2022-12-10 LAB — MAGNESIUM: Magnesium: 2.4 mg/dL (ref 1.7–2.4)

## 2022-12-10 LAB — BASIC METABOLIC PANEL WITH GFR
Anion gap: 13 (ref 5–15)
BUN: 38 mg/dL — ABNORMAL HIGH (ref 8–23)
CO2: 21 mmol/L — ABNORMAL LOW (ref 22–32)
Calcium: 9.1 mg/dL (ref 8.9–10.3)
Chloride: 105 mmol/L (ref 98–111)
Creatinine, Ser: 0.99 mg/dL (ref 0.44–1.00)
GFR, Estimated: 52 mL/min — ABNORMAL LOW
Glucose, Bld: 112 mg/dL — ABNORMAL HIGH (ref 70–99)
Potassium: 3.8 mmol/L (ref 3.5–5.1)
Sodium: 139 mmol/L (ref 135–145)

## 2022-12-10 LAB — PHOSPHORUS: Phosphorus: 2 mg/dL — ABNORMAL LOW (ref 2.5–4.6)

## 2022-12-10 MED ORDER — SODIUM CHLORIDE 0.9 % IV SOLN
1.0000 g | INTRAVENOUS | Status: DC
  Administered 2022-12-10: 1 g via INTRAVENOUS
  Filled 2022-12-10: qty 10

## 2022-12-10 MED ORDER — POLYVINYL ALCOHOL 1.4 % OP SOLN: 2.0000 [drp] | OPHTHALMIC | Status: DC | PRN

## 2022-12-10 MED ORDER — NAPHAZOLINE-GLYCERIN 0.012-0.25 % OP SOLN: 1.0000 [drp] | Freq: Four times a day (QID) | OPHTHALMIC | Status: DC | PRN

## 2022-12-10 MED ORDER — CIPROFLOXACIN HCL 0.3 % OP SOLN
2.0000 [drp] | OPHTHALMIC | Status: DC
  Filled 2022-12-10: qty 2.5

## 2022-12-10 MED ORDER — CIPROFLOXACIN HCL 0.3 % OP SOLN: 2.0000 [drp] | OPHTHALMIC | Status: DC

## 2022-12-10 NOTE — TOC Progression Note (Signed)
Transition of Care [facility]) - Progression Note    Patient Details  Name: Beverly Kim MRN: [id number] Date of Birth: Jul 10, 1924  [facility] CM/SW Contact  Gordy Clement, RN Phone Number: 12/10/2022, 12:02 PM  Clinical Narrative:     CM has Contacted Daughter to discuss DC plans.  Daughter requested I contact Marylene Land, the patient's Granddaughter [phone]) She is listed as a contact. Patient lives with Granddaughter and she is home with her all day.  Address is [address] [place]  [address]. RNCM will change Facesheet to reflect this correct address.   [facility] was previously working with patient and will  resume services at DC.  A hoyer lift was recommended and will be provided by [employer]- They will contact Granddaughter for delivery   TOC will continue to follow for any additional DC needs         Expected Discharge Plan and Services                                               Social Determinants of Health (SDOH) Interventions SDOH Screenings   Food Insecurity: No Food Insecurity (12/07/2022)  Housing: Low Risk  (12/07/2022)  Transportation Needs: No Transportation Needs (12/07/2022)  Utilities: Not At Risk (12/07/2022)  Alcohol Screen: Low Risk  (09/11/2019)  Depression (PHQ2-9): Low Risk  (09/11/2019)  Financial Resource Strain: Low Risk  (09/11/2019)  Physical Activity: Sufficiently Active (08/19/2018)  Social Connections: Moderately Integrated (09/11/2019)  Stress: No Stress Concern Present (09/11/2019)  Tobacco Use: Low Risk  (12/07/2022)    Readmission Risk Interventions    09/07/2022    3:18 PM  Readmission Risk Prevention Plan  Post Dischage Appt Complete  Medication Screening Complete  Transportation Screening Complete

## 2022-12-10 NOTE — TOC CM/SW Note (Signed)
    Durable Medical Equipment  (From admission, onward)           Start     Ordered   12/10/22 1158  For home use only DME Other see comment  Once       Comments: HOYER LIFT  Question:  Length of Need  Answer:  Lifetime   12/10/22 1157

## 2022-12-10 NOTE — Consult Note (Addendum)
Cardiology Consultation   Patient ID: BRITTIN PICH MRN: [id number]; DOB: [birth year]  Admit date: 12/07/2022 Date of Consult: 12/10/2022  PCP:  Pincus Sanes, MD   [facility] Providers Cardiologist:  Bryan Lemma, MD        Patient Profile:   Beverly Kim is a 89 y.o. female with a hx of chronic combined systolic diastolic heart failure, permanent atrial fibrillation not on OAC by choice, CHB s/p St Jude PPM, CAD, HTN, HLD not on statin by choice, severe MR and LBBB who is being seen 12/10/2022 for the evaluation of dehydration at the request of Dr. Randol Kern.  History of Present Illness:   Beverly Kim is a pleasantly confused 89 year old female with past medical history of chronic combined systolic diastolic heart failure, permanent atrial fibrillation not on OAC by choice, CHB s/p St Jude PPM, CAD, HTN, HLD not on statin by choice, severe MR and LBBB.  Echocardiogram in 2011 and 2015 showed EF 45 to 50%.  EF improved to 60 to 65% on echocardiogram in April 2018 and August 2021.  However ejection fraction later dropped down to 40 to 45% on repeat echocardiogram in April 2023 with severe MR.  She is on low-dose bisoprolol for rate control and has a pacemaker to prevent slow heart rate.  He had 50 and 70% moderate LAD lesion after D1 on previous cath in 2003 however Myoview was negative in 2013.  It was suspected her LV dysfunction was due to LBBB and A-fib.  Given her advanced age, there was no plan for valve procedure to help fix the severe mitral regurgitation.  She was previously seen by Dr. Excell Seltzer who felt she is still unlikely to benefit from MitraClip.  She was last seen by Dr. Herbie Baltimore in October 2023 at which time she was on daily dosing of furosemide and metolazone 2.5 mg Monday Wednesday Friday.  She was admitted in June 2023 due to lower GI bleed and underwent colonoscopy.  Colonoscopy revealed hemorrhoid was 2 to 4 mm polyps in the transverse colon and the cecum  removed with cold snare, 4 colonic angiodysplastic lesion treated with APC.  Echocardiogram obtained on 09/04/2022 showed EF 40 to 45%, global hypokinesis, moderately reduced RVEF, moderate biatrial enlargement, moderate to severe MR, severe TR.  In August 2024, she had a significant fall that resulted in displaced right femoral neck fracture.  She was taken to the OR on 10/11/2018 for by Dr. Carola Frost and underwent unipolar hemiarthroplasty of the right hip.  According to the daughter, she has been seen by PT and OT however since her recent fall and hip fracture, she has not been able to bear weight and walk around.  Prior to the fall, she was largely independent.  She was ultimately discharged to rehab and has since been discharged from the rehab to live with her granddaughter.  Her granddaughter is managing the medication.  During the rehab, she was started on Lyrica, Lasix 40 mg daily was switched to torsemide 60 mg daily.  There was report that her metolazone was also increased to 5 mg twice a week, however her granddaughter says she has been receiving her older 2.5 mg 3 times weekly dosing.  As for her torsemide, her granddaughter has only been giving her torsemide 20 mg twice a day due to fear of dehydration.  Since week 2 after her discharge from rehab, granddaughter has been concerned that she appears to be more confused at home.  She was ultimately  sent back to the [facility] on 12/07/2022 due to confusion and weakness.  On arrival, blood work showed sodium 134, BUN has increased from the previous 19 up to 103.  Creatinine trended up to 1.49.  Hemoglobin normal at 14.9.  Viral panel negative, urinalysis negative so far.  Head CT showed no acute process but has chronic small vessel disease.  EKG showed paced rhythm with left bundle branch block.  Her diuretic was held during this hospitalization.  Midodrine was added to help with her blood pressure.  Cardiology service consulted to adjust her diuretic  dosing. She has received 1 L of IV fluid when she came in and 50 mL/h of IV fluid since then. On exam, she appears to be euvolemic.     Past Medical History:  Diagnosis Date   Acute blood loss anemia 09/04/2022   Cardiac pacemaker 05/2016   Sick sinus syndrome/complete heart block   Chronic heart failure with preserved ejection fraction (HFpEF) (HCC)    Colon polyps    Diverticulosis    Dyspnea    GERD (gastroesophageal reflux disease)    Guaiac positive stools 09/04/2022   Hearing loss of both ears    Hepatic cyst    Hiatal hernia    Hyperlipidemia    Hypertension    Hypothyroidism    Low back pain    Non-occlusive coronary artery disease 04/2001   Cardiac Cath 04/18/2001: 50 and 70% mid LAD after D1. = Nonischemic by Myoview.  Medical management.   Other left bundle branch block    PAF (paroxysmal atrial fibrillation) (HCC) 08/16/2016   observed on PPM interrogation, asymptomatic, chad2vasc score is 5.  Bisoprolol for rate control, Xarelto for anticoagulation.   Rectal bleeding 09/04/2022   URI (upper respiratory infection)     Past Surgical History:  Procedure Laterality Date   BIOPSY  09/05/2022   Procedure: BIOPSY;  Surgeon: Meridee Score Netty Starring., MD;  Location: [facility] ENDOSCOPY;  Service: Gastroenterology;;   CARDIOVERSION N/A 01/16/2019   Procedure: CARDIOVERSION;  Surgeon: Wendall Stade, MD;  Location: [facility] ENDOSCOPY;  Service: Cardiovascular;  Laterality: N/A;   CHOLECYSTECTOMY N/A 12/15/2019   Procedure: LAPAROSCOPIC CHOLECYSTECTOMY;  Surgeon: Almond Lint, MD;  Location: [facility] OR;  Service: General;  Laterality: N/A;   COLONOSCOPY     COLONOSCOPY N/A 09/05/2022   Procedure: COLONOSCOPY;  Surgeon: Lemar Lofty., MD;  Location: [facility] ENDOSCOPY;  Service: Gastroenterology;  Laterality: N/A;   ERCP N/A 07/21/2019   Procedure: ENDOSCOPIC RETROGRADE CHOLANGIOPANCREATOGRAPHY (ERCP);  Surgeon: Rachael Fee, MD;  Location: [facility] ENDOSCOPY;  Service: Endoscopy;  Laterality:  N/A;   HERNIA REPAIR     HIP ARTHROPLASTY Right 10/11/2022   Procedure: ARTHROPLASTY BIPOLAR HIP (HEMIARTHROPLASTY);  Surgeon: Myrene Galas, MD;  Location: [facility] OR;  Service: Orthopedics;  Laterality: Right;   HOT HEMOSTASIS N/A 09/05/2022   Procedure: HOT HEMOSTASIS (ARGON PLASMA COAGULATION/BICAP);  Surgeon: Lemar Lofty., MD;  Location: [facility] ENDOSCOPY;  Service: Gastroenterology;  Laterality: N/A;   LEFT HEART CATH AND CORONARY ANGIOGRAPHY  04/18/2001   Dr. Riley Kill: EF 60 to 65%.  2+ MR.  Upper LM takeoff with 90 degree bend just inside the ostium.  50-70% mid LAD after D1.  D1 is 30%.    Distal LAD wraps the apex and is free of disease.  LCx is mostly large OM branch.  RCA provides PDA and PL branches and Free of disease. -->  Presumably evaluated by Myoview that was nonischemic, therefore no PCI performed.   NM  MYOVIEW LTD  01/2012   Normal EF.  Normal study.  No ischemia or infarction.   PACEMAKER IMPLANT Left 05/15/2016   SJM Assirity MRI dual chamber PPM implanted by Dr Johney Frame for CHB   POLYPECTOMY  09/05/2022   Procedure: POLYPECTOMY;  Surgeon: Mansouraty, Netty Starring., MD;  Location: [facility] ENDOSCOPY;  Service: Gastroenterology;;   REMOVAL OF STONES  07/21/2019   Procedure: REMOVAL OF STONES;  Surgeon: Rachael Fee, MD;  Location: [facility] ENDOSCOPY;  Service: Endoscopy;;   SPHINCTEROTOMY  07/21/2019   Procedure: Dennison Mascot;  Surgeon: Rachael Fee, MD;  Location: [facility] ENDOSCOPY;  Service: Endoscopy;;   THYROIDECTOMY     TONSILLECTOMY AND ADENOIDECTOMY     TOTAL ABDOMINAL HYSTERECTOMY W/ BILATERAL SALPINGOOPHORECTOMY     TRANSTHORACIC ECHOCARDIOGRAM  06/27/2016   Moderate basal-septal LVH, EF 60 to 65%.  No R WMA.  GR 1 DD.  Mild aortic valve calcification.  No AS.  Trivial MR.--Indicated improved EF compared to prior study in 2015.   TRANSTHORACIC ECHOCARDIOGRAM  02/22/2012   12/'13 -a) EF 50 and 55%.  Normal function.-Moderate MR.  Mild LA dilation.; b) 11/2015Echo for shortness  of breath => reduced EF of 45 to 50%.  Mild LVH.  Inferior reversible HK.  GRII DD.  Moderate MR.   TRANSTHORACIC ECHOCARDIOGRAM  10/2019   EF 60 -65%.  No RWMA.  Severe concentric LVH.  Unable to assess diastolic function.  Mild biatrial dilation.  Moderate MR moderate TR, no AS   TRANSTHORACIC ECHOCARDIOGRAM  06/15/2021   EF 40 to 45%.  Paradoxical septal motion from pacemaker.  Mild concentric LVH.  Indeterminate diastolic pressure.  Severe biatrial enlargement would argue significant diastolic dysfunction.  Severe MR and severe TR. => EF down from 60 to 65%.     Home Medications:  Prior to Admission medications   Medication Sig Start Date End Date Taking? Authorizing Provider  acetaminophen (TYLENOL) 325 MG tablet Take 650 mg by mouth every 8 (eight) hours.   Yes [provider]  bisoprolol (ZEBETA) 5 MG tablet Take 0.5 tablets (2.5 mg total) by mouth daily. 11/22/22  Yes Burns, Bobette Mo, MD  docusate sodium (COLACE) 100 MG capsule Take 1 capsule (100 mg total) by mouth 2 (two) times daily. 11/22/22  Yes Burns, Bobette Mo, MD  guaifenesin (ROBITUSSIN) 100 MG/5ML syrup Take 200 mg by mouth every other day as needed for cough.   Yes [provider]  hydrocortisone (ANUSOL-HC) 25 MG suppository Place 1 suppository (25 mg total) rectally 2 (two) times daily as needed for hemorrhoids or anal itching. 11/27/22  Yes Burns, Bobette Mo, MD  Hydrocortisone (PREPARATION H EX) Apply 1 Application topically 4 (four) times daily.   Yes [provider]  hydrocortisone 2.5 % cream Apply topically 2 (two) times daily as needed (hemorrhoids). 11/27/22  Yes Burns, Bobette Mo, MD  metolazone (ZAROXOLYN) 5 MG tablet Take 1 tablet (5 mg total) by mouth 2 (two) times a week. 11/22/22  Yes Burns, Bobette Mo, MD  Nutritional Supplements (NUTRITIONAL DRINK PO) Take 120 mLs by mouth 2 (two) times daily.   Yes [provider]  nystatin (MYCOSTATIN/NYSTOP) powder Apply topically 3 (three) times daily as  needed. To affected skin folds (under breast and abdomen) 11/20/22  Yes Burns, Bobette Mo, MD  pantoprazole (PROTONIX) 40 MG tablet Take 1 tablet (40 mg total) by mouth daily. 11/22/22  Yes Burns, Bobette Mo, MD  polyethylene glycol (MIRALAX / GLYCOLAX) 17 g packet Take 17 g by mouth 2 (  two) times daily. 10/16/22  Yes [name], MD  potassium chloride SA (KLOR-CON M) 20 MEQ tablet Take 20 mEq by mouth 2 (two) times daily.   Yes [provider]  pregabalin (LYRICA) 25 MG capsule Take 1 capsule (25 mg total) by mouth 2 (two) times daily. 11/22/22  Yes Burns, Bobette Mo, MD  Sennosides (SENOKOT EXTRA STRENGTH) 17.2 MG TABS Take 2 tablets (34.4 mg total) by mouth daily. Patient taking differently: Take 17.2 mg by mouth 2 (two) times daily. 11/22/22  Yes Burns, Bobette Mo, MD  spironolactone (ALDACTONE) 25 MG tablet Take 0.5 tablets (12.5 mg total) by mouth daily. 11/22/22  Yes Burns, Bobette Mo, MD  SYNTHROID 112 MCG tablet Take 1 tablet (112 mcg total) by mouth daily before breakfast. 11/22/22  Yes Burns, Bobette Mo, MD  torsemide (DEMADEX) 20 MG tablet Take 3 tablets (60 mg total) by mouth daily. 11/22/22  Yes Pincus Sanes, MD    Inpatient Medications: Scheduled Meds:  acetaminophen  650 mg Oral Q8H   bisoprolol  2.5 mg Oral Daily   diclofenac Sodium  4 g Topical QID   docusate sodium  100 mg Oral BID   feeding supplement  1 Bottle Oral BID BM   heparin  5,000 Units Subcutaneous Q8H   levothyroxine  112 mcg Oral QAC breakfast   lidocaine  1 patch Transdermal Q24H   midodrine  2.5 mg Oral TID WC   multivitamin with minerals  1 tablet Oral Daily   pantoprazole  40 mg Oral Daily   polyethylene glycol  17 g Oral BID   senna  34.4 mg Oral Daily   traZODone  25 mg Oral QHS   Continuous Infusions:  sodium chloride 50 mL/hr at 12/10/22 1232   PRN Meds: albuterol, guaiFENesin, HYDROcodone-acetaminophen, hydrocortisone, hydrocortisone, ondansetron **OR** ondansetron (ZOFRAN) IV,  promethazine  Allergies:    Allergies  Allergen Reactions   Adhesive [Tape] Itching, Dermatitis, Rash and Other (See Comments)    Blisters and "skin bubbles"   Ace Inhibitors Cough   Atorvastatin Other (See Comments)    REACTION: Reaction not known   Clindamycin Other (See Comments)    Unknown   Codeine Other (See Comments)    hallucinations    Latex Other (See Comments)    blisters   Shellfish Allergy Other (See Comments)    "gallbladder attack"   Simvastatin Other (See Comments)    fatigue   Penicillins Rash    Social History:   Social History   Socioeconomic History   Marital status: Widowed    Spouse name: Not on file   Number of children: 4   Years of education: Not on file   Highest education level: Not on file  Occupational History   Occupation: retired  Tobacco Use   Smoking status: Never   Smokeless tobacco: Never  Vaping Use   Vaping status: Never Used  Substance and Sexual Activity   Alcohol use: No   Drug use: No   Sexual activity: Never  Other Topics Concern   Not on file  Social History Narrative   Not on file   Social Determinants of Health   Financial Resource Strain: Low Risk  (09/11/2019)   Overall Financial Resource Strain (CARDIA)    Difficulty of Paying Living Expenses: Not hard at all  Food Insecurity: No Food Insecurity (12/07/2022)   Hunger Vital Sign    Worried About Running Out of Food in the Last Year: Never true    Ran Out  of Food in the Last Year: Never true  Transportation Needs: No Transportation Needs (12/07/2022)   PRAPARE - Administrator, Civil Service (Medical): No    Lack of Transportation (Non-Medical): No  Physical Activity: Sufficiently Active (08/19/2018)   Exercise Vital Sign    Days of Exercise per Week: 5 days    Minutes of Exercise per Session: 40 min  Stress: No Stress Concern Present (09/11/2019)   [employer] of Occupational Health - Occupational Stress Questionnaire    Feeling of Stress : Not  at all  Social Connections: Moderately Integrated (09/11/2019)   Social Connection and Isolation Panel [NHANES]    Frequency of Communication with Friends and Family: More than three times a week    Frequency of Social Gatherings with Friends and Family: More than three times a week    Attends Religious Services: More than 4 times per year    Active Member of [employer] or Organizations: Yes    Attends Banker Meetings: More than 4 times per year    Marital Status: Widowed  Intimate Partner Violence: Patient Unable To Answer (12/07/2022)   Humiliation, Afraid, Rape, and Kick questionnaire    Fear of Current or Ex-Partner: Patient unable to answer    Emotionally Abused: Patient unable to answer    Physically Abused: Patient unable to answer    Sexually Abused: Patient unable to answer    Family History:    Family History  Problem Relation Age of Onset   Coronary artery disease Other        family hx of 1st degree relative <50   Diabetes Other        family hx of   Other Other        cardiovascular disorder family hx of   Other Other        neurological disorder family hx of   Other Other        respiratory disease family hx of   Heart attack Daughter    Heart Problems Other        all children   Sudden death Son    Heart disease Brother    Heart disease Sister    Colon cancer Neg Hx    Colon polyps Neg Hx      ROS:  Please see the history of present illness.   All other ROS reviewed and negative.     Physical Exam/Data:   Vitals:   12/10/22 0600 12/10/22 0800 12/10/22 1139 12/10/22 1200  BP:  (!) 130/50 130/73 (!) 139/59  Pulse: 60 64 64   Resp: 20 18 14    Temp:  98.4 F (36.9 C) 97.7 F (36.5 C)   TempSrc:  Axillary Oral   SpO2: 97% 96% 97%   Weight:      Height:        Intake/Output Summary (Last 24 hours) at 12/10/2022 1353 Last data filed at 12/10/2022 0544 Gross per 24 hour  Intake 90 ml  Output 150 ml  Net -60 ml      12/07/2022    8:21 PM  12/07/2022    4:00 PM 11/13/2022   12:25 PM  Last 3 Weights  Weight (lbs) 139 lb 12.4 oz 167 lb 8.8 oz 167 lb 8.8 oz  Weight (kg) 63.4 kg 76 kg 76 kg     Body mass index is 25.16 kg/m.  General: Alert, very hard of hearing and slightly confused. HEENT: normal Neck: no JVD Vascular: No carotid bruits;  Distal pulses 2+ bilaterally Cardiac:  normal S1, S2; RRR; no murmur  Lungs:  clear to auscultation bilaterally, no wheezing, rhonchi or rales  Abd: soft, nontender, no hepatomegaly  Ext: no edema Musculoskeletal:  No deformities, BUE and BLE strength normal and equal Skin: warm and dry  Neuro: Unable to assess Psych: Unable to assess psych  EKG:  The EKG was personally reviewed and demonstrates: Paced rhythm with left bundle branch block Telemetry:  Telemetry was personally reviewed and demonstrates: Paced rhythm, no significant tachycardia or bradycardia  Relevant CV Studies:  Echo 09/04/2022 1. Global hypokinesis abnoraml septal motion worse at inferior base. Left  ventricular ejection fraction, by estimation, is 40 to 45%. The left  ventricle has mildly decreased function. The left ventricle demonstrates  global hypokinesis. The left  ventricular internal cavity size was mildly dilated. Left ventricular  diastolic parameters are indeterminate.   2. Device lead in RA/RV . Right ventricular systolic function is  moderately reduced. The right ventricular size is normal.   3. Left atrial size was moderately dilated.   4. Right atrial size was moderately dilated.   5. The mitral valve is normal in structure. Moderate to severe mitral  valve regurgitation. No evidence of mitral stenosis.   6. Cannot r/o device lead impingement on TV leaflets as cause for severe  TR No 3D short axis images done . Tricuspid valve regurgitation is severe.   7. The aortic valve is tricuspid. There is mild calcification of the  aortic valve. There is mild thickening of the aortic valve. Aortic valve   regurgitation is not visualized. Aortic valve sclerosis is present, with  no evidence of aortic valve stenosis.   8. The inferior vena cava is dilated in size with <50% respiratory  variability, suggesting right atrial pressure of 15 mmHg.   Laboratory Data:  High Sensitivity Troponin:   Recent Labs  Lab 11/13/22 1325 11/13/22 1434  TROPONINIHS 19* 19*     Chemistry Recent Labs  Lab 12/07/22 1557 12/07/22 2123 12/08/22 1554 12/09/22 0443 12/10/22 0326  NA 134*  --  138 138 139  K 3.7  --  3.4* 3.4* 3.8  CL 91*  --  98 102 105  CO2 27  --  29 24 21*  GLUCOSE 127*  --  194* 109* 112*  BUN 103*  --  72* 55* 38*  CREATININE 1.49*   < > 1.16* 0.92 0.99  CALCIUM 10.1  --  9.4 9.3 9.1  MG 3.1*  --   --   --  2.4  GFRNONAA 32*   < > 43* 57* 52*  ANIONGAP 16*  --  11 12 13    < > = values in this interval not displayed.    Recent Labs  Lab 12/07/22 1557 12/08/22 1554  PROT 8.2* 7.0  ALBUMIN 3.4* 3.0*  AST 27 26  ALT 10 11  ALKPHOS 102 97  BILITOT 1.1 0.5   Lipids No results for input(s): "CHOL", "TRIG", "HDL", "LABVLDL", "LDLCALC", "CHOLHDL" in the last 168 hours.  Hematology Recent Labs  Lab 12/08/22 1554 12/09/22 0443 12/10/22 0326  WBC 6.6 6.8 7.9  RBC 4.55 4.48 4.11  HGB 13.7 12.9 12.2  HCT 42.6 40.9 38.2  MCV 93.6 91.3 92.9  MCH 30.1 28.8 29.7  MCHC 32.2 31.5 31.9  RDW 17.1* 17.0* 17.2*  PLT 256 228 213   Thyroid No results for input(s): "TSH", "FREET4" in the last 168 hours.  BNPNo results for input(s): "BNP", "PROBNP" in  the last 168 hours.  DDimer No results for input(s): "DDIMER" in the last 168 hours.   Radiology/Studies:  DG Chest Portable 1 View  Result Date: 12/07/2022 CLINICAL DATA:  Altered mental status. EXAM: PORTABLE CHEST 1 VIEW COMPARISON:  11/13/2022 FINDINGS: Stable cardiomegaly. Left-sided pacemaker in place. Chronic interstitial coarsening without interval change. Calcified granuloma and mediastinal lymph nodes. No acute airspace  disease, large pleural effusion or pneumothorax. Stable osseous structures. IMPRESSION: Stable cardiomegaly. Chronic interstitial coarsening. No acute findings. Electronically Signed   By: Narda Rutherford M.D.   On: 12/07/2022 18:48   CT Head Wo Contrast  Result Date: 12/07/2022 CLINICAL DATA:  Mental status change, unknown cause EXAM: CT HEAD WITHOUT CONTRAST TECHNIQUE: Contiguous axial images were obtained from the base of the skull through the vertex without intravenous contrast. RADIATION DOSE REDUCTION: This exam was performed according to the departmental dose-optimization program which includes automated exposure control, adjustment of the mA and/or kV according to patient size and/or use of iterative reconstruction technique. COMPARISON:  10/09/2022 FINDINGS: Brain: Stable atrophy chronic small vessel ischemia. No intracranial hemorrhage, mass effect, or midline shift. No hydrocephalus. The basilar cisterns are patent. No evidence of territorial infarct or acute ischemia. No extra-axial or intracranial fluid collection. Vascular: Atherosclerosis of skullbase vasculature without hyperdense vessel or abnormal calcification. Skull: No fracture or focal lesion. Sinuses/Orbits: No acute finding. Other: None. IMPRESSION: 1. No acute intracranial abnormality. 2. Stable atrophy and chronic small vessel ischemia. Electronically Signed   By: Narda Rutherford M.D.   On: 12/07/2022 18:47     Assessment and Plan:   Dehydration:  -All diuretics including 12.5 mg of spironolactone, torsemide and metolazone held.  BUN greater than 100 on arrival.  Creatinine 1.4 which has improved after IV hydration.  -Metolazone added to help with blood pressure.  -Several discrepancies with her home medication.  It appears during the recent rehab after hip replacement surgery, metolazone was changed from 2.5 mg 3 times weekly Monday Wednesday Friday to 5 mg twice a week, however according to granddaughter for manage her  medication, patient is still taking metolazone at 2.5 mg 3 times a week Monday Wednesday and Friday at this time.  She was switched from 40 mg daily of furosemide to 60 mg daily of torsemide, however according to granddaughter, she has been receiving torsemide at 20 mg twice a day at home.  -Patient appears to be euvolemic on exam.  Will discuss with MD, likely will reduce torsemide down to 20 mg daily or switch back to furosemide at 40 mg daily.  She has been taking metolazone 2.5 mg 3 times a week for the past year, with significant mitral regurgitation and severe TR, will resume total dose on discharge. At this point I agree that she probably could be on lower doses of medicines.  Continue torsemide 20 mg daily.  She had been using metolazone 2.5 mg 3 times a week, at this point I would simply use it as needed for weight gain more than 3 pounds in a day or 5 pounds in a week. Would probably consider restarting spironolactone in the next day or so but hold torsemide until plan for discharge.  Confusion: Primary reason for admission.  Likely due to dehydration but may be in part related to recent medication adjustment.  She was started on Lyrica at recent rehab, Lyrica has been stopped.  She has not been able to bear weight or walk since her hip fracture surgery in August. Completely confused today.  Not able to answer questions.  Only aware of where she was but that is it.  She thought I was her son.  Chronic combined systolic and diastolic heart failure: See #1.  All diuretics currently on hold due to dehydration.  Permanent atrial fibrillation not on anticoagulation therapy: Continue bisoprolol 2.5 mg daily.  She was on warfarin last year.  More recently, she made an attempt to switch to Eliquis, however felt poorly after starting on Eliquis.  She ultimately made the decision of stopping anticoagulation therapy to tolerate higher chance of stroke. => This decision was based on long discussions with the  patient and family over the last couple years and clinic visits.  Complete heart block s/p Saint Jude PPM: Recent device interrogation showed permanent atrial fibrillation  CAD: Moderate disease seen in LAD territory in 2003.  Nonischemic Myoview in 2013  Hypertension: On bisoprolol for rate control.  Patient was also placed on midodrine 2.5 mg 3 times a day during this hospitalization to help blood pressure  Hyperlipidemia: Not on statin due to personal choice  Moderate to severe MR with severe TR: Seen on echocardiogram.   Risk Assessment/Risk Scores:          CHA2DS2-VASc Score = 5   This indicates a 7.2% annual risk of stroke. The patient's score is based upon: CHF History: 1 HTN History: 0 Diabetes History: 0 Stroke History: 0 Vascular Disease History: 1 Age Score: 2 Gender Score: 1         For questions or updates, please contact [facility] Please consult [email or web address] for contact info under    Signed, Azalee Course, PA  12/10/2022 1:53 PM   ATTENDING ATTESTATION  I have seen, examined and evaluated the patient this afternoon along with Azalee Course, PA.  After reviewing all the available data and chart, we discussed the patients laboratory, study & physical findings as well as symptoms in detail.  I agree with his findings, examination as well as impression recommendations as per our discussion.    Attending adjustments noted in italics.   Admitted for overdiuresis with AKI.  This resolved related to adjustment of medications following her hip surgery.  She seems relatively euvolemic today.  Please see recommendations above as far as holding diuretics until plan discharge but can restart spironolactone in the next day or so. Agree with decreasing to 20 mg torsemide daily and using metolazone only as needed up to 3 days a week.  Will follow along.  Unfortunately she still has significant amount of confusion which is probably related to multiple different  hospitalizations.    Marykay Lex, MD, MS Bryan Lemma, M.D., M.S. Interventional Cardiologist  [facility]  Pager # [phone] Phone # [phone] [address] [place], [place] [address]

## 2022-12-10 NOTE — Progress Notes (Signed)
PROGRESS NOTE    Beverly Kim  JXB:[id number] DOB: 07-12-1924 DOA: 12/07/2022 PCP: Pincus Sanes, MD   Chief Complaint  Patient presents with   Altered Mental Status   Weakness   Dehydration    Brief Narrative:   : Beverly Kim is a 89 y.o. female with medical history significant of essential hypertension, hypothyroidism, hyperlipidemia, paroxysmal atrial fibrillation, sick sinus syndrome, chronic heart failure with preserved ejection fraction, chronic kidney disease stage III was brought in from home with complaint of generalized weakness and poor oral intake.  Patient was seen by her PCP recently in the outpatient setting.  She has blood work done that shows some mild AKI.  She was recently in rehab but got discharged from rehab to home.  She has progressively gotten worse and altered.  Granddaughter brought her to the ER for evaluation.  They initially thought she may have had some UTI.  In the ER however urinalysis was negative.  Acute flu assays negative.  Patient found to have AKI.  Hide BUN has gone up from 19-103 in the last 1 month.  Patient is on 3 diuretics including torsemide, metolazone and Aldactone.  She is also on ACE inhibitor.  Patient has been admitted with generalized weakness, AKI, dehydration and evidence of uremia.    Assessment & Plan:   Principal Problem:   Acute metabolic encephalopathy Active Problems:   Chronic combined systolic and diastolic CHF (congestive heart failure) (HCC)   CKD stage 3a, GFR 45-59 ml/min (HCC)   Hypothyroidism   Hyperlipidemia   MIXED HEARING LOSS BILATERAL   Essential hypertension   Coronary artery disease involving native heart without angina pectoris   GERD  AKI on CKD stage III B Uremia  generalized weakness -This is most likely in the setting of recently increasing her diuresis after discharge from SNF, she was on Lasix and metolazone, she was started on significant dose torsemide, her metolazone dose has been  increased and she was started on Aldactone. -Patient presents with progressive weakness, most recent creatinine 1.07, elevated up to 1.4 on admission.   -At home has been held including Aldactone, torsemide and metolazone  -Treated with IV fluids, monitor closely given known history of chronic diastolic CHF. -PT/OT consulted, recommendation for home health on discharge -Cardiology consulted upon family request regarding management of diuretics and antihypertensive medications, will await further recommendations   Hypertension -Blood pressure has been initially soft, which she required midodrine, now it did improve on IV fluids, so I will DC midodrine, as well IV fluid has been discontinued given improved renal function and uremia - -continue with bisoprolol.  Acute metabolic encephalopathy -This is secondary to dehydration and uremia, as her BUN is 103, CT head with no acute finding, mentation much improved upon my evaluation today  hypothyroidism:  -Continue levothyroxine.  Paroxysmal A-fib -Continue with bisoprolol -She is not on any anticoagulation given history of intolerance in the past, I have discussed this again with granddaughter at bedside, apparently patient had significant nosebleed, feeling sick, and" sense of dying while taking Eliquis", where she is absolutely does not want to go back on it.   hyperlipidemia:  - Continue statin   Essential hypertension: Continue blood pressure medications.   Coronary artery disease: Stable   GERD: Continue with PPIs   Chronic combined systolic and diastolic heart failure: Most recent EF 40 to 45% -, she appears compensated, actually she appears to be on the right side, so certainly diuresis is held, she is  on IV fluids, will monitor closely for volume overload, diuretics has been held including torsemide, Zaroxolyn, as well Aldactone on hold given AKI, patient is followed by cardiology Dr. Herbie [place], family do request cardiology to get  involved about her cardiac medications, and diuresis, so official consult was placed.     DVT prophylaxis:  Code Status: DNR Family Communication: None at bedside Disposition:   Status is: Inpatient    Consultants:  Radiology  Subjective:  Has a chronic right lower extremity neuropathic pain Objective: Vitals:   12/10/22 0600 12/10/22 0800 12/10/22 1139 12/10/22 1200  BP:  (!) 130/50 130/73 (!) 139/59  Pulse: 60 64 64   Resp: 20 18 14    Temp:  98.4 F (36.9 C) 97.7 F (36.5 C)   TempSrc:  Axillary Oral   SpO2: 97% 96% 97%   Weight:      Height:        Intake/Output Summary (Last 24 hours) at 12/10/2022 1445 Last data filed at 12/10/2022 0544 Gross per 24 hour  Intake 90 ml  Output 150 ml  Net -60 ml   Filed Weights   12/07/22 1600 12/07/22 2021  Weight: 76 kg 63.4 kg    Examination:  Awake Alert, pleasant, in no apparent distress Symmetrical Chest wall movement, Good air movement bilaterally, CTAB RRR,No Gallops,Rubs or new Murmurs, No Parasternal Heave +ve B.Sounds, Abd Soft, No tenderness, No rebound - guarding or rigidity. No Cyanosis, Clubbing or edema, No new Rash or bruise      Data Reviewed: I have personally reviewed following labs and imaging studies  CBC: Recent Labs  Lab 12/07/22 1557 12/07/22 2123 12/08/22 1554 12/09/22 0443 12/10/22 0326  WBC 7.0 6.4 6.6 6.8 7.9  HGB 14.9 14.0 13.7 12.9 12.2  HCT 46.5* 43.2 42.6 40.9 38.2  MCV 91.0 89.6 93.6 91.3 92.9  PLT 262 255 256 228 213    Basic Metabolic Panel: Recent Labs  Lab 12/07/22 1557 12/07/22 2123 12/08/22 1554 12/09/22 0443 12/10/22 0326  NA 134*  --  138 138 139  K 3.7  --  3.4* 3.4* 3.8  CL 91*  --  98 102 105  CO2 27  --  29 24 21*  GLUCOSE 127*  --  194* 109* 112*  BUN 103*  --  72* 55* 38*  CREATININE 1.49* 1.37* 1.16* 0.92 0.99  CALCIUM 10.1  --  9.4 9.3 9.1  MG 3.1*  --   --   --  2.4  PHOS  --   --   --   --  2.0*    GFR: Estimated Creatinine Clearance:  29.4 mL/min (by C-G formula based on SCr of 0.99 mg/dL).  Liver Function Tests: Recent Labs  Lab 12/07/22 1557 12/08/22 1554  AST 27 26  ALT 10 11  ALKPHOS 102 97  BILITOT 1.1 0.5  PROT 8.2* 7.0  ALBUMIN 3.4* 3.0*    CBG: No results for input(s): "GLUCAP" in the last 168 hours.   Recent Results (from the past 240 hour(s))  Resp panel by RT-PCR (RSV, Flu A&B, Covid) Anterior Nasal Swab     Status: None   Collection Time: 12/07/22  4:18 PM   Specimen: Anterior Nasal Swab  Result Value Ref Range Status   SARS Coronavirus 2 by RT PCR NEGATIVE NEGATIVE Final   Influenza A by PCR NEGATIVE NEGATIVE Final   Influenza B by PCR NEGATIVE NEGATIVE Final    Comment: (NOTE) The Xpert Xpress SARS-CoV-2/FLU/RSV plus assay is intended as an  aid in the diagnosis of influenza from Nasopharyngeal swab specimens and should not be used as a sole basis for treatment. Nasal washings and aspirates are unacceptable for Xpert Xpress SARS-CoV-2/FLU/RSV testing.  Fact Sheet for Patients: BloggerCourse.com  Fact Sheet for Healthcare Providers: SeriousBroker.it  This test is not yet approved or cleared by the [place] FDA and has been authorized for detection and/or diagnosis of SARS-CoV-2 by FDA under an Emergency Use Authorization (EUA). This EUA will remain in effect (meaning this test can be used) for the duration of the COVID-19 declaration under Section 564(b)(1) of the Act, 21 U.S.C. section 360bbb-3(b)(1), unless the authorization is terminated or revoked.     Resp Syncytial Virus by PCR NEGATIVE NEGATIVE Final    Comment: (NOTE) Fact Sheet for Patients: BloggerCourse.com  Fact Sheet for Healthcare Providers: SeriousBroker.it  This test is not yet approved or cleared by the [place] FDA and has been authorized for detection and/or diagnosis of SARS-CoV-2 by FDA under an  Emergency Use Authorization (EUA). This EUA will remain in effect (meaning this test can be used) for the duration of the COVID-19 declaration under Section 564(b)(1) of the Act, 21 U.S.C. section 360bbb-3(b)(1), unless the authorization is terminated or revoked.  Performed at [facility], [address] [place], [place] [address]          Radiology Studies: No results found.      Scheduled Meds:  acetaminophen  650 mg Oral Q8H   bisoprolol  2.5 mg Oral Daily   diclofenac Sodium  4 g Topical QID   docusate sodium  100 mg Oral BID   feeding supplement  1 Bottle Oral BID BM   heparin  5,000 Units Subcutaneous Q8H   levothyroxine  112 mcg Oral QAC breakfast   lidocaine  1 patch Transdermal Q24H   multivitamin with minerals  1 tablet Oral Daily   pantoprazole  40 mg Oral Daily   polyethylene glycol  17 g Oral BID   senna  34.4 mg Oral Daily   traZODone  25 mg Oral QHS   Continuous Infusions:     LOS: 3 days       Huey Bienenstock, MD [facility]   To contact the attending provider between 7A-7P or the covering provider during after hours 7P-7A, please log into the web site [email or web address] and access using universal [place] password for that web site. If you do not have the password, please call the hospital operator.  12/10/2022, 2:45 PM

## 2022-12-10 NOTE — Plan of Care (Signed)

## 2022-12-10 NOTE — Progress Notes (Signed)
[facility] night cross cover note:   I was notified by RN that, following UA performed on 10/4, which did not appear infected, the patient has developed a foul smelling urine.  I subsequently placed order for repeat urinalysis to further evaluate.     Newton Pigg, DO Hospitalist

## 2022-12-10 NOTE — Telephone Encounter (Signed)
When call came through I was out of the office already for the day.  I did call Beverly Kim back to let her know we were able to see where she was currently admitted and they would keep Dr.Burns in the loop with her care.

## 2022-12-10 NOTE — Care Management Important Message (Signed)
Important Message  Patient Details  Name: Beverly Kim MRN: [id number] Date of Birth: Sep 18, 1924   Important Message Given:  Yes - Medicare IM     Dorena Bodo 12/10/2022, 2:29 PM

## 2022-12-11 ENCOUNTER — Encounter: Payer: Self-pay | Admitting: Internal Medicine

## 2022-12-11 ENCOUNTER — Inpatient Hospital Stay (HOSPITAL_COMMUNITY): Payer: PPO

## 2022-12-11 ENCOUNTER — Other Ambulatory Visit (HOSPITAL_COMMUNITY): Payer: Self-pay

## 2022-12-11 DIAGNOSIS — R410 Disorientation, unspecified: Secondary | ICD-10-CM | POA: Diagnosis not present

## 2022-12-11 DIAGNOSIS — I10 Essential (primary) hypertension: Secondary | ICD-10-CM | POA: Diagnosis not present

## 2022-12-11 DIAGNOSIS — G9341 Metabolic encephalopathy: Secondary | ICD-10-CM | POA: Diagnosis not present

## 2022-12-11 MED ORDER — CIPROFLOXACIN HCL 0.3 % OP SOLN
2.0000 [drp] | OPHTHALMIC | Status: DC
  Administered 2022-12-11: 2 [drp] via OPHTHALMIC
  Filled 2022-12-11: qty 2.5

## 2022-12-11 MED ORDER — SPIRONOLACTONE 25 MG PO TABS: 12.5000 mg | ORAL_TABLET | Freq: Every day | ORAL | Status: DC

## 2022-12-11 MED ORDER — LORAZEPAM 2 MG/ML IJ SOLN
0.5000 mg | Freq: Once | INTRAMUSCULAR | Status: AC | PRN
  Administered 2022-12-11: 0.5 mg via INTRAVENOUS
  Filled 2022-12-11: qty 1

## 2022-12-11 MED ORDER — SODIUM CHLORIDE 0.9 % IV SOLN: INTRAVENOUS | Status: DC

## 2022-12-11 MED ORDER — CIPROFLOXACIN HCL 0.3 % OP SOLN
2.0000 [drp] | OPHTHALMIC | 0 refills | Status: DC
  Filled 2022-12-11: qty 10, 12d supply, fill #0

## 2022-12-11 MED ORDER — CEPHALEXIN 500 MG PO CAPS
500.0000 mg | ORAL_CAPSULE | Freq: Two times a day (BID) | ORAL | 0 refills | Status: DC
  Filled 2022-12-11: qty 8, 4d supply, fill #0

## 2022-12-11 MED ORDER — TORSEMIDE 20 MG PO TABS
20.0000 mg | ORAL_TABLET | Freq: Every day | ORAL | 1 refills | Status: DC
  Filled 2022-12-11: qty 270, 270d supply, fill #0

## 2022-12-11 MED ORDER — METOLAZONE 5 MG PO TABS
ORAL_TABLET | ORAL | 0 refills | Status: DC
Start: 2022-12-12 — End: 2022-12-25
  Filled 2022-12-11: qty 24, 112d supply, fill #0

## 2022-12-11 NOTE — Discharge Summary (Signed)
Physician Discharge Summary  Beverly Kim ZOX:[id number] DOB: 1925-01-24 DOA: 12/07/2022  PCP: Pincus Sanes, MD  Admit date: 12/07/2022 Discharge date: 12/11/2022  Admitted From: (Home) Disposition:  (Home )  Recommendations for Outpatient Follow-up:  Follow up with PCP in 1-2 weeks Please obtain BMP/CBC in one week   Home Health: (YES) has been arranged including PT/OT's as well requested RN to help with medication adjustment and monitoring and volume status monitoring, cardiology as well arranged for outpatient follow-up.  CODE STATUS: ( DNR) Diet recommendation: Heart Healthy   Brief/Interim Summary:  Beverly Kim is a 89 y.o. female with medical history significant of essential hypertension, hypothyroidism, hyperlipidemia, paroxysmal atrial fibrillation, sick sinus syndrome, chronic heart failure with preserved ejection fraction, chronic kidney disease stage III was brought in from home with complaint of generalized weakness and poor oral intake.  Seen by PCP, told she has mild AKI, she just left subacute rehab facility within couple weeks, does appear her diuretics as been increased during that hospital stay, daughter was concerned that her confusion related to UTI, but UA came negative on admission, her workup significant for uremia, AKI, Patient found to have AKI.  Hide BUN has gone up from 19-103 in the last 1 month.  Patient is on 3 diuretics including torsemide, metolazone and Aldactone.  She is also on ACE inhibitor.  Patient has been admitted with generalized weakness, AKI, dehydration and evidence of uremia, this has improved with IV hydration, patient had significant confusion 10/7, with some reports of dysuria, UA has been repeated which did show evidence of pyuria, and bacteriuria, urine culture growing E. coli and Klebsiella, she was treated with IV Rocephin x 2 days she will be discharged on Keflex, patient has hospital delirium over the last 48 hours, which she is  intermittently tearful, I have discussed with family, daughter, granddaughter at length, given her age, she is high risk for hospital delirium which carries poor prognosis, they requested her to go home which I think is very appropriate, and actually her being home might improve her delirium, she will be discharged on appropriate medication including antibiotics, with recommendation to adjustment for her diuretics, please see discussion below.  AKI on CKD stage III B Uremia generalized weakness Dehydration -This is most likely in the setting of recently increasing her diuresis after discharge from SNF, she was on Lasix and metolazone, she was started on significant dose torsemide, her metolazone dose has been increased and she was started on Aldactone. -Patient presents with progressive weakness, most recent creatinine 1.07, elevated up to 1.4 on admission.  Her home medications including Aldactone, torsemide and metolazone has been held, she was kept on IV fluids as she appears dehydrated on presentation, volume status much improved, she appears to be euvolemic, cardiology has been consulted regarding further recommendations diuresis, they recommend decreasing torsemide to 20 mg daily, start Aldactone in couple days, and to change metolazone on as-needed basis 2.5 mg 3 times weekly.   Hypertension -Blood pressure has been initially soft, which she required midodrine, but it did improve with IV fluids, midodrine has been discontinued, bisoprolol has been resumed /   Acute metabolic encephalopathy -This is secondary to dehydration and uremia, as her BUN is 103, CT head with no acute finding, mentation much improved initially with improving of her uremia, but it does appear she did develop some hospital delirium overnight, confused, was crying, attempts has been made to obtain MRI of the brain, for which she has received IV  Ativan which made her more sleepy and drowsy this afternoon, but still could not  sit still for MRI, were unable to obtain, discussed with family, they rather have her home, which I think is very appropriate as it is going to be very difficult for her delirium to resolve in the hospital setting,  she will be better served at home, medication has been adjusted, lyrica has been discontinued to minimize risk of encephalopathy as well.   hypothyroidism:  -Continue levothyroxine.  UTI -Urine analysis was + 12/10/2022, with culture growing E. coli and Klebsiella, treated with IV Rocephin x 2 days, she will be discharged on Keflex x 4 days.   Paroxysmal A-fib -Continue with bisoprolol -She is not on any anticoagulation given history of intolerance in the past, I have discussed this again with granddaughter at bedside, apparently patient had significant nosebleed, feeling sick, and" sense of dying while taking Eliquis", where she is absolutely does not want to go back on it.   hyperlipidemia:  - Continue statin   Essential hypertension: Continue blood pressure medications.   Coronary artery disease: Stable   GERD: Continue with PPIs   Chronic combined systolic and diastolic heart failure: Most recent EF 40 to 45% -, please see above discussion regarding diuresis. -Cardiology has arranged for outpatient follow-up Marjie Skiff PA-C 10/22 at 10:55 AM   Conjunctivitis - Left eye discharge, redness, started on ciprofloxacin ophthalmic for possible conjunctivitis    Discharge Diagnoses:  Principal Problem:   Acute metabolic encephalopathy Active Problems:   Chronic combined systolic and diastolic CHF (congestive heart failure) (HCC)   CKD stage 3a, GFR 45-59 ml/min (HCC)   Hypothyroidism   Hyperlipidemia   MIXED HEARING LOSS BILATERAL   Essential hypertension   Coronary artery disease involving native heart without angina pectoris   GERD    Discharge Instructions  Discharge Instructions     Diet - low sodium heart healthy   Complete by: As directed    Increase  activity slowly   Complete by: As directed    No wound care   Complete by: As directed       Allergies as of 12/11/2022       Reactions   Adhesive [tape] Itching, Dermatitis, Rash, Other (See Comments)   Blisters and "skin bubbles"   Ace Inhibitors Cough   Atorvastatin Other (See Comments)   REACTION: Reaction not known   Clindamycin Other (See Comments)   Unknown   Codeine Other (See Comments)   hallucinations    Latex Other (See Comments)   blisters   Shellfish Allergy Other (See Comments)   "gallbladder attack"   Simvastatin Other (See Comments)   fatigue   Penicillins Rash        Medication List     STOP taking these medications    potassium chloride SA 20 MEQ tablet Commonly known as: KLOR-CON M   pregabalin 25 MG capsule Commonly known as: LYRICA       TAKE these medications    acetaminophen 325 MG tablet Commonly known as: TYLENOL Take 650 mg by mouth every 8 (eight) hours.   bisoprolol 5 MG tablet Commonly known as: ZEBETA Take 0.5 tablets (2.5 mg total) by mouth daily.   cephALEXin 500 MG capsule Commonly known as: KEFLEX Take 1 capsule (500 mg total) by mouth 2 (two) times daily for 4 days.   ciprofloxacin 0.3 % ophthalmic solution Commonly known as: CILOXAN Place 2 drops into both eyes every 2 (two) hours while awake. Administer  1 drop, every 2 hours, while awake, for 2 days. Then 1 drop, every 4 hours, while awake, for the next 7 days.   docusate sodium 100 MG capsule Commonly known as: COLACE Take 1 capsule (100 mg total) by mouth 2 (two) times daily.   guaifenesin 100 MG/5ML syrup Commonly known as: ROBITUSSIN Take 200 mg by mouth every other day as needed for cough.   hydrocortisone 2.5 % cream Apply topically 2 (two) times daily as needed (hemorrhoids).   hydrocortisone 25 MG suppository Commonly known as: ANUSOL-HC Place 1 suppository (25 mg total) rectally 2 (two) times daily as needed for hemorrhoids or anal itching.    metolazone 5 MG tablet Commonly known as: ZAROXOLYN Please take 2.5 mg oral 3 times weekly(Monday, Wednesday, Friday) as needed only for fluid retention, edema and shortness of breath. Start taking on: December 12, 2022 What changed:  how much to take how to take this when to take this additional instructions   NUTRITIONAL DRINK PO Take 120 mLs by mouth 2 (two) times daily.   nystatin powder Commonly known as: MYCOSTATIN/NYSTOP Apply topically 3 (three) times daily as needed. To affected skin folds (under breast and abdomen)   pantoprazole 40 MG tablet Commonly known as: PROTONIX Take 1 tablet (40 mg total) by mouth daily.   polyethylene glycol 17 g packet Commonly known as: MIRALAX / GLYCOLAX Take 17 g by mouth 2 (two) times daily.   PREPARATION H EX Apply 1 Application topically 4 (four) times daily.   Senokot Extra Strength 17.2 MG Tabs Generic drug: Sennosides Take 2 tablets (34.4 mg total) by mouth daily. What changed:  how much to take when to take this   spironolactone 25 MG tablet Commonly known as: ALDACTONE Take 0.5 tablets (12.5 mg total) by mouth daily. Start taking on: December 13, 2022 What changed: These instructions start on December 13, 2022. If you are unsure what to do until then, ask your doctor or other care provider.   Synthroid 112 MCG tablet Generic drug: levothyroxine Take 1 tablet (112 mcg total) by mouth daily before breakfast.   torsemide 20 MG tablet Commonly known as: DEMADEX Take 1 tablet (20 mg total) by mouth daily. Start taking on: December 12, 2022 What changed: how much to take               Durable Medical Equipment  (From admission, onward)           Start     Ordered   12/10/22 1158  For home use only DME Other see comment  Once       Comments: HOYER LIFT  Question:  Length of Need  Answer:  Lifetime   12/10/22 1157            Follow-up Information     [facility]  Follow up.   Why: Iantha Fallen will provide Home Health and will contact you within 48 hours of discharge to arrange home visit Contact information: [address] [place] [address] [phone]         Corrin Parker, PA-C Follow up on 12/25/2022.   Specialty: Cardiology Why: Marjie Skiff, PA-C 10/22 @10 :55 am Contact information: [address] [place] [address] [phone]                Allergies  Allergen Reactions   Adhesive [Tape] Itching, Dermatitis, Rash and Other (See Comments)    Blisters and "skin bubbles"   Ace  Inhibitors Cough   Atorvastatin Other (See Comments)    REACTION: Reaction not known   Clindamycin Other (See Comments)    Unknown   Codeine Other (See Comments)    hallucinations    Latex Other (See Comments)    blisters   Shellfish Allergy Other (See Comments)    "gallbladder attack"   Simvastatin Other (See Comments)    fatigue   Penicillins Rash    Consultations: Cardiology   Procedures/Studies: DG Chest Portable 1 View  Result Date: 12/07/2022 CLINICAL DATA:  Altered mental status. EXAM: PORTABLE CHEST 1 VIEW COMPARISON:  11/13/2022 FINDINGS: Stable cardiomegaly. Left-sided pacemaker in place. Chronic interstitial coarsening without interval change. Calcified granuloma and mediastinal lymph nodes. No acute airspace disease, large pleural effusion or pneumothorax. Stable osseous structures. IMPRESSION: Stable cardiomegaly. Chronic interstitial coarsening. No acute findings. Electronically Signed   By: Narda Rutherford M.D.   On: 12/07/2022 18:48   CT Head Wo Contrast  Result Date: 12/07/2022 CLINICAL DATA:  Mental status change, unknown cause EXAM: CT HEAD WITHOUT CONTRAST TECHNIQUE: Contiguous axial images were obtained from the base of the skull through the vertex without intravenous contrast. RADIATION DOSE REDUCTION: This exam was performed according to the departmental dose-optimization program which  includes automated exposure control, adjustment of the mA and/or kV according to patient size and/or use of iterative reconstruction technique. COMPARISON:  10/09/2022 FINDINGS: Brain: Stable atrophy chronic small vessel ischemia. No intracranial hemorrhage, mass effect, or midline shift. No hydrocephalus. The basilar cisterns are patent. No evidence of territorial infarct or acute ischemia. No extra-axial or intracranial fluid collection. Vascular: Atherosclerosis of skullbase vasculature without hyperdense vessel or abnormal calcification. Skull: No fracture or focal lesion. Sinuses/Orbits: No acute finding. Other: None. IMPRESSION: 1. No acute intracranial abnormality. 2. Stable atrophy and chronic small vessel ischemia. Electronically Signed   By: Narda Rutherford M.D.   On: 12/07/2022 18:47   DG Chest Port 1 View  Result Date: 11/13/2022 CLINICAL DATA:  Shortness of breath EXAM: PORTABLE CHEST 1 VIEW COMPARISON:  X-ray 10/03/2022 FINDINGS: Enlarged heart with a calcified aorta. Underinflation with some interstitial changes and chronic changes of the lungs. The mild lung base opacities very well could be bronchovascular crowding and atelectasis with a decreased inflation compared to prior x-ray. Left upper chest pacemaker battery pack with leads along the right side of the enlarged heart. Overlapping cardiac leads. Presumed calcified left-sided lung nodule and lymph nodes. IMPRESSION: Enlarged heart with pacemaker. Chronic interstitial lung changes with underinflation and bronchovascular crowding with some basilar presumed atelectasis. Electronically Signed   By: Karen Kays M.D.   On: 11/13/2022 15:28      Subjective: Patient was confused overnight, tearful, she received IV Ativan for MRI which she could not tolerate.  Discharge Exam: Vitals:   12/11/22 1200 12/11/22 1545  BP: (!) 145/87 (!) 119/48  Pulse: 66 60  Resp: 20 (!) 24  Temp: 99.4 F (37.4 C)   SpO2: 96% 94%   Vitals:   12/11/22  0400 12/11/22 0800 12/11/22 1200 12/11/22 1545  BP: (!) 143/61 (!) 144/77 (!) 145/87 (!) 119/48  Pulse:  67 66 60  Resp: (!) 29 20 20  (!) 24  Temp: 97.8 F (36.6 C) 99.5 F (37.5 C) 99.4 F (37.4 C)   TempSrc: Axillary Axillary Axillary   SpO2:  97% 96% 94%  Weight:      Height:        General: Earlier this morning patient was confused, this afternoon was reevaluated, she is  sleeping comfortably after receiving Ativan and attempt to perform MRI. Cardiovascular: RRR, S1/S2 +, no rubs, no gallops Respiratory: CTA bilaterally, no wheezing, no rhonchi Abdominal: Soft, NT, ND, bowel sounds + Extremities: no edema, no cyanosis    The results of significant diagnostics from this hospitalization (including imaging, microbiology, ancillary and laboratory) are listed below for reference.     Microbiology: Recent Results (from the past 240 hour(s))  Resp panel by RT-PCR (RSV, Flu A&B, Covid) Anterior Nasal Swab     Status: None   Collection Time: 12/07/22  4:18 PM   Specimen: Anterior Nasal Swab  Result Value Ref Range Status   SARS Coronavirus 2 by RT PCR NEGATIVE NEGATIVE Final   Influenza A by PCR NEGATIVE NEGATIVE Final   Influenza B by PCR NEGATIVE NEGATIVE Final    Comment: (NOTE) The Xpert Xpress SARS-CoV-2/FLU/RSV plus assay is intended as an aid in the diagnosis of influenza from Nasopharyngeal swab specimens and should not be used as a sole basis for treatment. Nasal washings and aspirates are unacceptable for Xpert Xpress SARS-CoV-2/FLU/RSV testing.  Fact Sheet for Patients: BloggerCourse.com  Fact Sheet for Healthcare Providers: SeriousBroker.it  This test is not yet approved or cleared by the [place] FDA and has been authorized for detection and/or diagnosis of SARS-CoV-2 by FDA under an Emergency Use Authorization (EUA). This EUA will remain in effect (meaning this test can be used) for the duration of  the COVID-19 declaration under Section 564(b)(1) of the Act, 21 U.S.C. section 360bbb-3(b)(1), unless the authorization is terminated or revoked.     Resp Syncytial Virus by PCR NEGATIVE NEGATIVE Final    Comment: (NOTE) Fact Sheet for Patients: BloggerCourse.com  Fact Sheet for Healthcare Providers: SeriousBroker.it  This test is not yet approved or cleared by the [place] FDA and has been authorized for detection and/or diagnosis of SARS-CoV-2 by FDA under an Emergency Use Authorization (EUA). This EUA will remain in effect (meaning this test can be used) for the duration of the COVID-19 declaration under Section 564(b)(1) of the Act, 21 U.S.C. section 360bbb-3(b)(1), unless the authorization is terminated or revoked.  Performed at [facility], [address], [place], [place] [address]   Urine Culture (for pregnant, neutropenic or urologic patients or patients with an indwelling urinary catheter)     Status: Abnormal (Preliminary result)   Collection Time: 12/10/22  5:53 PM   Specimen: Urine, Clean Catch  Result Value Ref Range Status   Specimen Description URINE, CLEAN CATCH  Final   Special Requests NONE  Final   Culture (A)  Final    >=100,000 COLONIES/mL KLEBSIELLA PNEUMONIAE >=100,000 COLONIES/mL ESCHERICHIA COLI SUSCEPTIBILITIES TO FOLLOW Performed at [facility], [address] [place], [place] [address]    Report Status PENDING  Incomplete     Labs: BNP (last 3 results) Recent Labs    09/04/22 1114 10/03/22 1712 11/13/22 1240  BNP 297.0* 520.0* 1,168.1*   Basic Metabolic Panel: Recent Labs  Lab 12/07/22 1557 12/07/22 2123 12/08/22 1554 12/09/22 0443 12/10/22 0326  NA 134*  --  138 138 139  K 3.7  --  3.4* 3.4* 3.8  CL 91*  --  98 102 105  CO2 27  --  29 24 21*  GLUCOSE 127*  --  194* 109* 112*  BUN 103*  --  72* 55* 38*  CREATININE 1.49* 1.37* 1.16* 0.92 0.99  CALCIUM 10.1   --  9.4 9.3 9.1  MG 3.1*  --   --   --  2.4  PHOS  --   --   --   --  2.0*   Liver Function Tests: Recent Labs  Lab 12/07/22 1557 12/08/22 1554  AST 27 26  ALT 10 11  ALKPHOS 102 97  BILITOT 1.1 0.5  PROT 8.2* 7.0  ALBUMIN 3.4* 3.0*   No results for input(s): "LIPASE", "AMYLASE" in the last 168 hours. No results for input(s): "AMMONIA" in the last 168 hours. CBC: Recent Labs  Lab 12/07/22 1557 12/07/22 2123 12/08/22 1554 12/09/22 0443 12/10/22 0326  WBC 7.0 6.4 6.6 6.8 7.9  HGB 14.9 14.0 13.7 12.9 12.2  HCT 46.5* 43.2 42.6 40.9 38.2  MCV 91.0 89.6 93.6 91.3 92.9  PLT 262 255 256 228 213   Cardiac Enzymes: No results for input(s): "CKTOTAL", "CKMB", "CKMBINDEX", "TROPONINI" in the last 168 hours. BNP: Invalid input(s): "POCBNP" CBG: No results for input(s): "GLUCAP" in the last 168 hours. D-Dimer No results for input(s): "DDIMER" in the last 72 hours. Hgb A1c No results for input(s): "HGBA1C" in the last 72 hours. Lipid Profile No results for input(s): "CHOL", "HDL", "LDLCALC", "TRIG", "CHOLHDL", "LDLDIRECT" in the last 72 hours. Thyroid function studies No results for input(s): "TSH", "T4TOTAL", "T3FREE", "THYROIDAB" in the last 72 hours.  Invalid input(s): "FREET3" Anemia work up No results for input(s): "VITAMINB12", "FOLATE", "FERRITIN", "TIBC", "IRON", "RETICCTPCT" in the last 72 hours. Urinalysis    Component Value Date/Time   COLORURINE AMBER (A) 12/10/2022 1610   APPEARANCEUR CLOUDY (A) 12/10/2022 1610   LABSPEC 1.019 12/10/2022 1610   PHURINE 7.0 12/10/2022 1610   GLUCOSEU NEGATIVE 12/10/2022 1610   HGBUR SMALL (A) 12/10/2022 1610   HGBUR 2+ 12/13/2008 1032   BILIRUBINUR NEGATIVE 12/10/2022 1610   BILIRUBINUR n 12/25/2010 1311   KETONESUR NEGATIVE 12/10/2022 1610   PROTEINUR 100 (A) 12/10/2022 1610   UROBILINOGEN 0.2 12/25/2010 1311   UROBILINOGEN 0.2 12/13/2008 1032   NITRITE NEGATIVE 12/10/2022 1610   LEUKOCYTESUR MODERATE (A) 12/10/2022  1610   Sepsis Labs Recent Labs  Lab 12/07/22 2123 12/08/22 1554 12/09/22 0443 12/10/22 0326  WBC 6.4 6.6 6.8 7.9   Microbiology Recent Results (from the past 240 hour(s))  Resp panel by RT-PCR (RSV, Flu A&B, Covid) Anterior Nasal Swab     Status: None   Collection Time: 12/07/22  4:18 PM   Specimen: Anterior Nasal Swab  Result Value Ref Range Status   SARS Coronavirus 2 by RT PCR NEGATIVE NEGATIVE Final   Influenza A by PCR NEGATIVE NEGATIVE Final   Influenza B by PCR NEGATIVE NEGATIVE Final    Comment: (NOTE) The Xpert Xpress SARS-CoV-2/FLU/RSV plus assay is intended as an aid in the diagnosis of influenza from Nasopharyngeal swab specimens and should not be used as a sole basis for treatment. Nasal washings and aspirates are unacceptable for Xpert Xpress SARS-CoV-2/FLU/RSV testing.  Fact Sheet for Patients: BloggerCourse.com  Fact Sheet for Healthcare Providers: SeriousBroker.it  This test is not yet approved or cleared by the [place] FDA and has been authorized for detection and/or diagnosis of SARS-CoV-2 by FDA under an Emergency Use Authorization (EUA). This EUA will remain in effect (meaning this test can be used) for the duration of the COVID-19 declaration under Section 564(b)(1) of the Act, 21 U.S.C. section 360bbb-3(b)(1), unless the authorization is terminated or revoked.     Resp Syncytial Virus by PCR NEGATIVE NEGATIVE Final    Comment: (NOTE) Fact Sheet for Patients: BloggerCourse.com  Fact Sheet for Healthcare Providers: SeriousBroker.it  This test is not  yet approved or cleared by the [place] and has been authorized for detection and/or diagnosis of SARS-CoV-2 by FDA under an Emergency Use Authorization (EUA). This EUA will remain in effect (meaning this test can be used) for the duration of the COVID-19 declaration under Section  564(b)(1) of the Act, 21 U.S.C. section 360bbb-3(b)(1), unless the authorization is terminated or revoked.  Performed at [facility], [address], [place], [place] [address]   Urine Culture (for pregnant, neutropenic or urologic patients or patients with an indwelling urinary catheter)     Status: Abnormal (Preliminary result)   Collection Time: 12/10/22  5:53 PM   Specimen: Urine, Clean Catch  Result Value Ref Range Status   Specimen Description URINE, CLEAN CATCH  Final   Special Requests NONE  Final   Culture (A)  Final    >=100,000 COLONIES/mL KLEBSIELLA PNEUMONIAE >=100,000 COLONIES/mL ESCHERICHIA COLI SUSCEPTIBILITIES TO FOLLOW Performed at [facility], [address] [place], [place] [address]    Report Status PENDING  Incomplete     Time coordinating discharge: Over 30 minutes  SIGNED:   Huey Bienenstock, MD  [facility] 12/11/2022, 4:11 PM Pager   If 7PM-7AM, please contact night-coverage [email or web address] Password [email or web address]

## 2022-12-11 NOTE — Discharge Instructions (Signed)
Follow with Primary MD Pincus Sanes, MD in 7 days   Get CBC, CMP,  checked  by Primary MD next visit.    Activity: As tolerated with Full fall precautions use walker/cane & assistance as needed   Disposition Home    Diet: Heart Healthy , with feeding assistance and aspiration precautions.  For Heart failure patients - Check your Weight same time everyday, if you gain over 2 pounds, or you develop in leg swelling, experience more shortness of breath or chest pain, call your Primary MD immediately. Follow Cardiac Low Salt Diet and 1.5 lit/day fluid restriction.   On your next visit with your primary care physician please Get Medicines reviewed and adjusted.   Please request your Prim.MD to go over all Hospital Tests and Procedure/Radiological results at the follow up, please get all Hospital records sent to your Prim MD by signing hospital release before you go home.   If you experience worsening of your admission symptoms, develop shortness of breath, life threatening emergency, suicidal or homicidal thoughts you must seek medical attention immediately by calling 911 or calling your MD immediately  if symptoms less severe.  You Must read complete instructions/literature along with all the possible adverse reactions/side effects for all the Medicines you take and that have been prescribed to you. Take any new Medicines after you have completely understood and accpet all the possible adverse reactions/side effects.   Do not drive when taking Pain medications.    Do not take more than prescribed Pain, Sleep and Anxiety Medications  Special Instructions: If you have smoked or chewed Tobacco  in the last 2 yrs please stop smoking, stop any regular Alcohol  and or any Recreational drug use.  Wear Seat belts while driving.   Please note  You were cared for by a hospitalist during your hospital stay. If you have any questions about your discharge medications or the care you received  while you were in the hospital after you are discharged, you can call the unit and asked to speak with the hospitalist on call if the hospitalist that took care of you is not available. Once you are discharged, your primary care physician will handle any further medical issues. Please note that NO REFILLS for any discharge medications will be authorized once you are discharged, as it is imperative that you return to your primary care physician (or establish a relationship with a primary care physician if you do not have one) for your aftercare needs so that they can reassess your need for medications and monitor your lab values.

## 2022-12-11 NOTE — Progress Notes (Addendum)
Pt increasingly confused/ lethargic today. Fearful of staff, and not letting staff perform cares. Unable to safely give AM meds. MD made aware, order received for STAT MRI.   Per granddaughter, she would be more comfortable/ like patient to come home. Unable to obtain MRI due to safety concerns for patient.   Discharge order received, pt resting peacefully.

## 2022-12-11 NOTE — TOC Transition Note (Signed)
Transition of Care [facility]) - CM/SW Discharge Note   Patient Details  Name: Beverly Kim MRN: [id number] Date of Birth: Nov 08, 1924  [facility] CM/SW Contact:  Gordy Clement, RN Phone Number: 12/11/2022, 3:09 PM   Clinical Narrative:     Patient to dc to home (Granddaughters) today.  HH PT and OT will be provided by [facility]  Hoyer lift has been delivered to the home [facility]AR will transport patient home. Granddaughter has been contacted by CM and is expecting patient. She would like a call when patient is picked up. Floor RN notified . AVS updated  No additional TOC needs     Final next level of care: Home w Home Health Services     Patient Goals and CMS Choice      Discharge Placement                         Discharge Plan and Services Additional resources added to the After Visit Summary for                                       Social Determinants of Health (SDOH) Interventions SDOH Screenings   Food Insecurity: No Food Insecurity (12/07/2022)  Housing: Low Risk  (12/07/2022)  Transportation Needs: No Transportation Needs (12/07/2022)  Utilities: Not At Risk (12/07/2022)  Alcohol Screen: Low Risk  (09/11/2019)  Depression (PHQ2-9): Low Risk  (09/11/2019)  Financial Resource Strain: Low Risk  (09/11/2019)  Physical Activity: Sufficiently Active (08/19/2018)  Social Connections: Moderately Integrated (09/11/2019)  Stress: No Stress Concern Present (09/11/2019)  Tobacco Use: Low Risk  (12/07/2022)     Readmission Risk Interventions    09/07/2022    3:18 PM  Readmission Risk Prevention Plan  Post Dischage Appt Complete  Medication Screening Complete  Transportation Screening Complete

## 2022-12-11 NOTE — Plan of Care (Signed)

## 2022-12-12 ENCOUNTER — Telehealth: Payer: Self-pay

## 2022-12-12 LAB — URINE CULTURE: Culture: >=100000 — AB

## 2022-12-12 NOTE — Transitions of Care (Post Inpatient/ED Visit) (Signed)
12/12/2022  Name: Beverly Kim MRN: [id number] DOB: 08/31/1924  Today's TOC FU Call Status: Today's TOC FU Call Status:: Unsuccessful Call (1st Attempt) Unsuccessful Call (1st Attempt) Date: 12/12/22  Attempted to reach the patient regarding the most recent Inpatient/ED visit.  Follow Up Plan: Additional outreach attempts will be made to reach the patient to complete the Transitions of Care (Post Inpatient/ED visit) call.   Jodelle Gross RN, BSN, CCM RN Care Manager  Transitions of Care  [facility] - [facility]  [phone]

## 2022-12-13 DIAGNOSIS — E039 Hypothyroidism, unspecified: Secondary | ICD-10-CM | POA: Diagnosis not present

## 2022-12-13 DIAGNOSIS — I130 Hypertensive heart and chronic kidney disease with heart failure and stage 1 through stage 4 chronic kidney disease, or unspecified chronic kidney disease: Secondary | ICD-10-CM | POA: Diagnosis not present

## 2022-12-13 DIAGNOSIS — I5042 Chronic combined systolic (congestive) and diastolic (congestive) heart failure: Secondary | ICD-10-CM | POA: Diagnosis not present

## 2022-12-13 DIAGNOSIS — I4891 Unspecified atrial fibrillation: Secondary | ICD-10-CM | POA: Diagnosis not present

## 2022-12-13 DIAGNOSIS — E785 Hyperlipidemia, unspecified: Secondary | ICD-10-CM | POA: Diagnosis not present

## 2022-12-13 DIAGNOSIS — N189 Chronic kidney disease, unspecified: Secondary | ICD-10-CM | POA: Diagnosis not present

## 2022-12-13 DIAGNOSIS — I2510 Atherosclerotic heart disease of native coronary artery without angina pectoris: Secondary | ICD-10-CM | POA: Diagnosis not present

## 2022-12-13 DIAGNOSIS — K219 Gastro-esophageal reflux disease without esophagitis: Secondary | ICD-10-CM | POA: Diagnosis not present

## 2022-12-13 MED ORDER — CIPROFLOXACIN HCL 250 MG PO TABS: 250.0000 mg | ORAL_TABLET | Freq: Two times a day (BID) | ORAL | 0 refills | Status: AC

## 2022-12-13 NOTE — Telephone Encounter (Signed)
Patient's daughter called back again requesting a call.

## 2022-12-16 NOTE — Progress Notes (Signed)
Virtual Visit via Video Note   Because of Beverly Kim's co-morbid illnesses, she is at least at moderate risk for complications without adequate follow up.  This format is felt to be most appropriate for this patient at this time.  All issues noted in this document were discussed and addressed.  A limited physical exam was performed with this format.  Please refer to the patient's chart for her consent to telehealth for [facility].  Date:  12/25/2022   ID:  Beverly Kim, DOB 20-Mar-1924, MRN [id number]  Patient Location: Home Provider Location: Office  PCP:  Pincus Sanes, MD  Cardiologist:  Bryan Lemma, MD  Electrophysiologist:  None   Evaluation Performed:  Follow-Up Visit  Chief Complaint:  hospital follow-up of AKI  History of Present Illness:    Beverly Kim is a 89 y.o. female with a history of non-obstructive CAD on cardiac catheterization in 2003, chronic combined CHF with EF of 40-45% in 09/2022, permanent atrial fibrillation no longer on anticoagulation, complete heart block s/p St Jude PPM in 05/2016, LBBB severe mitral regurgitation, hypertension, hyperlipidemia, CKD stage III, GERD, anemia, and history of GI bleeds who is followed by Dr. Herbie Baltimore and presents today for hospital follow-up of AKI and uremia.  Patient has a complex cardiac history as detailed above. Remote cardiac catheterization in 04/2001 showed moderate non-obstructive CAD with 50-70% stenosis of mid LAD. Last ischemic evaluation was a Myoview in 2013 which was negative. He he has a history of complete heart block and underwent placement of a St. Jude pacemaker in 2018 as well as permanent atrial fibrillation and has declined anticoagulation. She also has a history of chronic combined CHF. Echo in 06/2021 showed a mild drop in EF to 40-45% with no regional wall motion abnormalities, mildly reduced RV function, severe biatrial enlargement, severe mitral regurgitation, and severe tricuspid  regurgitation. She was seen by Dr. Excell Seltzer but was not felt to be a candidate for MitraClip and focus as been on medical management.   She has had multiple admissions over the last few months. She was admitted from 08/08/2022 to 09/07/2022 for a lower GI bleed. Colonoscopy revealed a hemorrhoid, multiple polyps in the transverse colon and cecum which were treated with cold snare, and 4 colonic angiodysplastic lesions that were treated with APC. Echo during admission showed LVEF of 40-45% with global hypokinesis and abnormal septal motion worse at the inferior base, normal RV size with moderate RV dysfunction, moderate biatrial enlargement, moderate to severe MR, and severe TR. She was admitted in 10/2022 for a femoral neck fracture after an unwitnessed fall and underwent a hemiarthroplasty. Hospitalization was complicated by hospital delirium vs sundowning. It looks like her Eliquis was discontinued during this admission due to an intolerances and she subsequently decided to stop anticoagulation all together understanding the stroke risk with this. She was discharged to a SNF. She was most recently admitted from 12/07/2022 to 12/11/2022 with AKI and uremia felt to be secondary to dehydration after presenting with generalized weakness, poor PO intake, and confusion. BUN was 103 on arrival and creatinine was 1.49. Home Torsemide, Metolazone, and Spironolactone were held and she was treated with IV fluids with improvement in BUN and creatinine.  Cardiology was consulted and recommended decreasing Torsemide to 20mg  daily and holding Spironolactone for a few days (instructed to restart Spironolactone 12.5mg  on 12/13/2022) and only using Metolazone as needed. She was also found to have a UTI and was treated with antibiotics.  Patient presents today for follow-up. This is being done via video visit given patient is bedbound after hip surgery in 10/2022. Here granddaughter is on the video call with her and provides most of the  history given patient is very hard of hearing. She lives with her granddaughter who states states she has been bedbound since her hip surgery in 10/2022. Her granddaughter (and her husband) will help transition her from bed to a recliner where she sits for about 4 hours a day. She has home health RN and PT.  Her granddaughter states she continues to have some shortness of breath since decreasing diuretics at recent discharge.  She is currently taking Torsemide 20 mg daily and Spironolactone 12.5 mg daily.  She has PRN Metolazone (2.5mg  three times weekly as needed for weight gain or edema) listed under her medication list but has not been taking this. She was previously on 5mg  three times weekly prior to recent admission.  Her granddaughter does state that she has started to give her an extra dose of Torsemide 20 mg as needed for worsening shortness of breath in the afternoon the last couple days which seems to have helped  Her granddaughter also states that she thinks anxiety is playing a role and her PCP has recently prescribed a medication for this.  She has chronic orthopnea which is stable and no new PND.  Her lower extremity edema has resolved.  Patient initially described some chest pressure but when asked to elaborate on this she just described difficulty breathing.  No real chest pain. She looks like she is breathing comfortably on video today and granddaughter states she is breathing better. Her granddaughter states that she will have some mild lightheadedness/ dizziness after initially sitting up but this does not last long. No falls or syncope. No reports of palpitations. Her granddaughter states her memory is getting worse. She also recently had a UTI and had some delirium with this.  Prior CV studies:    The following studies were reviewed:   Echocardiogram 09/04/2022: Impressions: 1. Global hypokinesis abnoraml septal motion worse at inferior base. Left  ventricular ejection fraction, by  estimation, is 40 to 45%. The left  ventricle has mildly decreased function. The left ventricle demonstrates  global hypokinesis. The left  ventricular internal cavity size was mildly dilated. Left ventricular  diastolic parameters are indeterminate.   2. Device lead in RA/RV . Right ventricular systolic function is  moderately reduced. The right ventricular size is normal.   3. Left atrial size was moderately dilated.   4. Right atrial size was moderately dilated.   5. The mitral valve is normal in structure. Moderate to severe mitral  valve regurgitation. No evidence of mitral stenosis.   6. Cannot r/o device lead impingement on TV leaflets as cause for severe  TR No 3D short axis images done . Tricuspid valve regurgitation is severe.   7. The aortic valve is tricuspid. There is mild calcification of the  aortic valve. There is mild thickening of the aortic valve. Aortic valve  regurgitation is not visualized. Aortic valve sclerosis is present, with  no evidence of aortic valve stenosis.   8. The inferior vena cava is dilated in size with <50% respiratory  variability, suggesting right atrial pressure of 15 mmHg.   Labs/Other Tests and Data Reviewed:    EKG:  Last EKG from 12/07/2022 showed V-paced rhythm.  Recent Labs: 09/03/2022: TSH 5.034 11/13/2022: B Natriuretic Peptide 1,168.1 12/08/2022: ALT 11 12/10/2022: BUN 38; Creatinine,  Ser 0.99; Hemoglobin 12.2; Magnesium 2.4; Platelets 213; Potassium 3.8; Sodium 139   Recent Lipid Panel Lab Results  Component Value Date/Time   CHOL 132 05/08/2022 02:21 PM   TRIG 86.0 05/08/2022 02:21 PM   HDL 39.50 05/08/2022 02:21 PM   CHOLHDL 3 05/08/2022 02:21 PM   LDLCALC 75 05/08/2022 02:21 PM   LDLDIRECT 163.3 01/30/2012 10:16 AM    Wt Readings from Last 3 Encounters:  12/07/22 139 lb 12.4 oz (63.4 kg)  11/13/22 167 lb 8.8 oz (76 kg)  10/16/22 167 lb 5.3 oz (75.9 kg)     Objective:    Vital Signs:  No vitals available.  Vital Signs  Reviewed. General: No acute distress. Hard of hearing. Pulm: No labored breathing. No coughing during visit. No audible wheezing. Speaking in full sentences. Extremities: No noticeable edema. Neuro: No slurred speech. Answers questions appropriately. Psych: Pleasant affect.  ASSESSMENT & PLAN:    Chronic Combined CHF Patient has a history of chronic combined CHF. Last Echo in 09/2022 showed LVEF 40-45% with global hypokinesis and abnormal septal motion worse at the inferior base, normal RV size with moderate RV dysfunction, moderate biatrial enlargement, moderate to severe MR, and severe TR. She was recently admitted early this month for AKI and uremia in setting of dehydration and over-diuresis. She was treated with IV fluids and home diuretics were adjusted.  - Unable to assess volume status given virtual visit. Patient does report continued shortness of breath but this has improved some after PRN Torsemide dose in the afternoon.  - Continue Torsemide 20mg  daily. Can continue to take an extra 20mg  as needed for worsening shortness of breath. - Continue Spironolactone 12.5mg  daily. - She has PRN Metolazone (2.5mg  three times weekly PRN for weight gain or edema) listed under her medications list. However, it is difficult to weigh patient given she is bedbound. Granddaughter states she has not been taking this. Recommended holding off on this for now.  A home health RN is coming by today to get repeat labs (assuming a BMET). If renal function is stable, will increase Torsemide to 20mg  twice daily. Would like to avoid Metolazone if we are able to.  - Will hold off on SGLT2 inhibitor given recent yeast infection.  - Continue Bisoprolol 2.5mg  daily.  Non-Obstructive CAD Remote cardiac catheterization in 2003 showed moderate non-obstructive CAD. Myoview in 2013 was negative.  - She initially described some chest pressure but then when asked to elaborate on this she just stated that she has difficulty  breathing. No real chest pain. - She has declined a statin in the past.  - She has not been on Aspirin. Given her advanced age, will not be overly aggressive with this.   Permanent Atrial Fibrillation Recent device interrogation showed permanent atrial fibrillation.  - Unable to assess rate control given virtual visit. However, patient has not complained on any palpitations.  - Continue Bisoprolol 2.5mg  daily.  - She was previously on Coumadin and more recently Eliquis but felt poorly after starting Eliquis.She ultimately made the decision of stopping anticoagulation therapy all together knowing the increased stroke risk with this. (This decision was based on long discussions patient and family had with Dr. Herbie Baltimore over the last couple of years).   Complete Heart Block S/p St Jude PPM in 05/2016. Recent device interrogation showed permanent atrial fibrillation.  - Followed by Dr. Nelly Laurence.   Moderate to Severe Mitral Regurgitation Severe Tricuspid Regurgitation Noted on recent Echo in 09/2022. Previously seen by Dr. Excell Seltzer  and not felt to be a candidate for Mitraclip.  - Continue medical therapy. Continue diuresis for CHF as above.   Hypertension Granddaughter states BP has been well controlled. - Continue Bisoprolol and Spironolactone as above.  Hyperlipidemia - Patient has declined statins in the past.   CKD Stage III Recently admitted with AKI and uremia in setting of dehydration and diuresis. However, renal function improved with IV fluids and holding of diuretics. Creatinine 0.99 on discharge.  - Home health RN is reportedly coming by today to recheck labs ordered by PCP. Will follow-up on this.   Time:   Today, I have spent 28 minutes with the patient with telehealth technology discussing the above problems.     Medication Adjustments/Labs and Tests Ordered: Current medicines are reviewed at length with the patient today.  Concerns regarding medicines are outlined above.    Follow Up:  Virtual Visit  in 1 month(s)  Signed, Corrin Parker, PA-C  12/25/2022 1:15 PM    [facility]

## 2022-12-19 ENCOUNTER — Telehealth: Payer: Self-pay | Admitting: Internal Medicine

## 2022-12-19 MED ORDER — SERTRALINE HCL 25 MG PO TABS: 25.0000 mg | ORAL_TABLET | Freq: Every day | ORAL | 3 refills | Status: DC

## 2022-12-19 MED ORDER — KETOCONAZOLE 2 % EX CREA: 1.0000 | TOPICAL_CREAM | Freq: Every day | CUTANEOUS | 0 refills | Status: DC

## 2022-12-19 MED ORDER — FLUCONAZOLE 150 MG PO TABS: 150.0000 mg | ORAL_TABLET | Freq: Once | ORAL | 0 refills | Status: AC

## 2022-12-19 NOTE — Telephone Encounter (Signed)
Holly from [facility] called stating pt has a yeast infection on her butt tops, groin and vagina. They don't like giving her too much pain medication because it makes her sleepy but was inquiring something that can put her at ease. Jeanice Lim stated if Dr. Lawerance Bach have any question feel free to give her a call.  Best call back [phone]

## 2022-12-19 NOTE — Telephone Encounter (Signed)
Can an anti-anxiety medication - lets try sertraline 25 mg daily.  We can increase the dose if tolerated and needed. Rx sent to [facility]

## 2022-12-19 NOTE — Telephone Encounter (Signed)
Please do Diflucan 150 mg x 1 and ketoconazole cream to the affected areas once daily.  Prescription sent to [facility] in [place]

## 2022-12-20 NOTE — Telephone Encounter (Signed)
Spoke with Marylene Land today.

## 2022-12-20 NOTE — Telephone Encounter (Signed)
Spoke with patient today.

## 2022-12-21 ENCOUNTER — Telehealth: Payer: Self-pay | Admitting: Internal Medicine

## 2022-12-21 NOTE — Telephone Encounter (Signed)
Caller & What Company:  [place] with [facility]   Phone Number:[phone]   Needs Verbal orders for what service & frequency:  Requesting a repeat lab on Kidney function to check for dehydration and an order for a PT evaluabtion

## 2022-12-24 ENCOUNTER — Telehealth: Payer: Self-pay | Admitting: Student

## 2022-12-24 NOTE — Telephone Encounter (Signed)
Okay for orders? 

## 2022-12-24 NOTE — Telephone Encounter (Signed)
Message left for [facility] today with verbal ok for requested orders.

## 2022-12-24 NOTE — Telephone Encounter (Signed)
Spoke to granddaughter and let her know appointment has been changed to MyChart visit.

## 2022-12-24 NOTE — Telephone Encounter (Signed)
Pt granddaughter called in stating pt is bed bound and they need to know if she can a virtual visit tomorrow instead. Please advise.

## 2022-12-25 ENCOUNTER — Ambulatory Visit: Payer: PPO | Attending: Student | Admitting: Student

## 2022-12-25 ENCOUNTER — Encounter: Payer: Self-pay | Admitting: Student

## 2022-12-25 DIAGNOSIS — I442 Atrioventricular block, complete: Secondary | ICD-10-CM

## 2022-12-25 DIAGNOSIS — I071 Rheumatic tricuspid insufficiency: Secondary | ICD-10-CM

## 2022-12-25 DIAGNOSIS — I2510 Atherosclerotic heart disease of native coronary artery without angina pectoris: Secondary | ICD-10-CM

## 2022-12-25 DIAGNOSIS — N1831 Chronic kidney disease, stage 3a: Secondary | ICD-10-CM | POA: Diagnosis not present

## 2022-12-25 DIAGNOSIS — I4821 Permanent atrial fibrillation: Secondary | ICD-10-CM | POA: Diagnosis not present

## 2022-12-25 DIAGNOSIS — N183 Chronic kidney disease, stage 3 unspecified: Secondary | ICD-10-CM | POA: Diagnosis not present

## 2022-12-25 DIAGNOSIS — I340 Nonrheumatic mitral (valve) insufficiency: Secondary | ICD-10-CM | POA: Diagnosis not present

## 2022-12-25 DIAGNOSIS — I5042 Chronic combined systolic (congestive) and diastolic (congestive) heart failure: Secondary | ICD-10-CM

## 2022-12-25 DIAGNOSIS — E785 Hyperlipidemia, unspecified: Secondary | ICD-10-CM | POA: Diagnosis not present

## 2022-12-25 DIAGNOSIS — I10 Essential (primary) hypertension: Secondary | ICD-10-CM

## 2022-12-25 LAB — BASIC METABOLIC PANEL

## 2022-12-25 MED ORDER — TORSEMIDE 20 MG PO TABS: 20.0000 mg | ORAL_TABLET | Freq: Every day | ORAL | Status: DC

## 2022-12-25 NOTE — Patient Instructions (Addendum)
Medication Instructions:  STOP YOUR METOLAZONE TAKE TORSEMIDE 20MG  DAILY; IF WORSENING SHORTNESS OF BREATH YOUR MAY TAKE AN EXTRA 20MG  IN THE AFTERNOON *If you need a refill on your cardiac medications before your next appointment, please call your pharmacy*  Lab Work: NONE If you have labs (blood work) drawn today and your tests are completely normal, you will receive your results only by:   MyChart Message (if you have MyChart) OR  A paper copy in the mail If you have any lab test that is abnormal or we need to change your treatment, we will call you to review the results.  Other Instructions KEEP [place] UPDATED ON YOUR HOME HEALTH ORDERS, EX: LABS, MEDICATIONS  Follow-Up: At [facility], you and your health needs are our priority.  As part of our continuing mission to provide you with exceptional heart care, we have created designated Provider Care Teams.  These Care Teams include your primary Cardiologist (physician) and Advanced Practice Providers (APPs -  Physician Assistants and Nurse Practitioners) who all work together to provide you with the care you need, when you need it.  Your next appointment:   1 month(s)  Provider:   Bryan Lemma, MD  or Marjie Skiff, PA-C     YOUR FOLLOW UP APPOINTMENT: 01/21/2023 @ 3:10 PM THIS IS A VIRTUAL VISIT WE WILL CALL 30 MINUTES AHEAD FOR VITALS/ETC

## 2022-12-26 ENCOUNTER — Encounter: Payer: Self-pay | Admitting: Internal Medicine

## 2022-12-27 ENCOUNTER — Telehealth: Payer: Self-pay | Admitting: Student

## 2022-12-27 NOTE — Telephone Encounter (Signed)
  I saw patient for a virtual visit on 12/25/2022. Plan was to follow-up on BMET from home health RN before adjusting Torsemide. Reviewed scanned labs. Renal function stable. Let's go ahead and have her increase Torsemide to 20mg  twice daily. Let's also add a potassium supplement (potassium chloride 40 mEq daily). She will then need a repeat BMET in 1 week which home health RN should be able to due.  Marcelino Duster, can you please call patient/ granddaughter with these recommendations and help put in the orders?  Thank you! Sears Oran

## 2022-12-28 ENCOUNTER — Ambulatory Visit: Payer: Self-pay

## 2022-12-28 NOTE — Telephone Encounter (Signed)
Left message to call back - Lynett Grimes [facility]) to discuss medication changes.

## 2022-12-28 NOTE — Patient Instructions (Signed)
Visit Information  Thank you for taking time to visit with me today. Please don't hesitate to contact me if I can be of assistance to you.   Following are the goals we discussed today:  Take medications as prescribed. Contact Provider for health questions or concerns Continue to eat healthy, lean meats, vegetables, fruits, avoid saturated and transfats   Our next appointment is by telephone on 01/07/23 at 2:30 pm  Please call the care guide team at [phone] if you need to cancel or reschedule your appointment.   If you are experiencing a Mental Health or Behavioral Health Crisis or need someone to talk to, please call the Suicide and Crisis Lifeline: 988 call the [place] National Suicide Prevention Lifeline: (604)686-3305 or TTY: 262-139-9470 TTY 404-714-0049) to talk to a trained counselor call 1-800-273-TALK (toll free, 24 hour hotline)   Kathyrn Sheriff, RN, MSN, BSN, CCM Care Management Coordinator [phone]

## 2022-12-28 NOTE — Patient Outreach (Signed)
Care Coordination   Initial Visit Note   12/28/2022 Name: Beverly Kim MRN: [id number] DOB: 02-03-1925  Beverly Kim is a 89 y.o. year old female who sees Burns, Bobette Mo, MD for primary care. I spoke with Beverly Kim (grandaughter/dpr) by phone today.  What matters to the patients health and wellness today?  Patient had fall/ fracture of right hip with hemiarthroplasty on 10/11/22-Followed by SNF stay. Admitted 12/07/22-12/11/22 with acute metabolic encephalopathy. Discharged home with home health. Granddaughter primary caregiver at this time. She reports Beverly Kim is improving. She is active with [facility] and reports the nurse is on her way. Granddaughter with no questions at this time.  Goals Addressed             This Visit's Progress    Assist with health management       Interventions Today    Flowsheet Row Most Recent Value  Chronic Disease   Chronic disease during today's visit Other  [10/4-10/8/24 acute metabolic encphalopathy/dehydration]  General Interventions   General Interventions Discussed/Reviewed General Interventions Discussed  [Evaluation of current treatment plan for health condition and patient's adherence to plan.]  Education Interventions   Education Provided Provided Education  Provided Verbal Education On Medication, When to see the doctor  [discussed medications, advised to contact provider with health questions or concern. work with home health as recommended.]  Nutrition Interventions   Nutrition Discussed/Reviewed Nutrition Discussed  Pharmacy Interventions   Pharmacy Dicussed/Reviewed Pharmacy Topics Discussed            SDOH assessments and interventions completed:  No recently completed   Care Coordination Interventions:  Yes, provided   Follow up plan: Follow up call scheduled for 01/07/23    Encounter Outcome:  Patient Visit Completed

## 2023-01-03 ENCOUNTER — Encounter: Payer: Self-pay | Admitting: Internal Medicine

## 2023-01-07 ENCOUNTER — Telehealth: Payer: Self-pay

## 2023-01-07 ENCOUNTER — Telehealth: Payer: Self-pay | Admitting: Internal Medicine

## 2023-01-07 NOTE — Telephone Encounter (Signed)
Caller [employer]:  Mertie Moores   Phone Number:  [phone]   Needs Verbal orders for what service & frequency:  Order for hydrocolloid dressing for left buttocks and patient's grand daughter wants to know if blood work can be repeated to check kidney function

## 2023-01-07 NOTE — Patient Outreach (Signed)
  Care Coordination   01/07/2023 Name: Beverly Kim MRN: [id number] DOB: 19-Mar-1924   Care Coordination Outreach Attempts:  An unsuccessful telephone outreach was attempted for a scheduled appointment today.  Follow Up Plan:  Additional outreach attempts will be made to offer the patient care coordination information and services.   Encounter Outcome:  No Answer   Care Coordination Interventions:  No, not indicated    Kathyrn Sheriff, RN, MSN, BSN, CCM Care Management Coordinator [phone]

## 2023-01-08 NOTE — Telephone Encounter (Signed)
Spoke with [place] today.

## 2023-01-08 NOTE — Progress Notes (Signed)
Virtual Visit via Telephone Note   Because of Shyler B Murillo's co-morbid illnesses, she is at least at moderate risk for complications without adequate follow up.  This format is felt to be most appropriate for this patient at this time.  The patient did not have access to video technology/had technical difficulties with video requiring transitioning to audio format only (telephone).  All issues noted in this document were discussed and addressed.  No physical exam could be performed with this format.  Please refer to the patient's chart for her consent to telehealth for [facility]. Virtual platform was offered given ongoing worsening Covid-19 pandemic.  Date:  01/21/2023   ID:  [name], DOB 1924-05-27, MRN [id number]  Patient Location: Home Provider Location: Office  PCP:  Pincus Sanes, MD  Cardiologist:  Bryan Lemma, MD  Electrophysiologist:  None   Evaluation Performed:  Follow-Up Visit  Chief Complaint:  follow-up of CHF  History of Present Illness:    KABRIA VERRET is a 89 y.o. female with non-obstructive CAD on cardiac catheterization in 2003, chronic combined CHF with EF of 40-45% in 09/2022, permanent atrial fibrillation no longer on anticoagulation, complete heart block s/p St Jude PPM in 05/2016, LBBB severe mitral regurgitation, hypertension, hyperlipidemia, CKD stage III, GERD, anemia, and history of GI bleeds who is followed by Dr. Herbie Baltimore and presents today for follow-up of CHF.   Patient has a complex cardiac history as detailed above. Remote cardiac catheterization in 04/2001 showed moderate non-obstructive CAD with 50-70% stenosis of mid LAD. Last ischemic evaluation was a Myoview in 2013 which was negative. He he has a history of complete heart block and underwent placement of a St. Jude pacemaker in 2018 as well as permanent atrial fibrillation and has declined anticoagulation. She also has a history of chronic combined CHF. Echo in 06/2021 showed a  mild drop in EF to 40-45% with no regional wall motion abnormalities, mildly reduced RV function, severe biatrial enlargement, severe mitral regurgitation, and severe tricuspid regurgitation. She was seen by Dr. Excell Seltzer but was not felt to be a candidate for MitraClip and focus as been on medical management.   She has had multiple admissions over the last few months. She was admitted from 08/08/2022 to 09/07/2022 for a lower GI bleed. Colonoscopy revealed a hemorrhoid, multiple polyps in the transverse colon and cecum which were treated with cold snare, and 4 colonic angiodysplastic lesions that were treated with APC. Echo during admission showed LVEF of 40-45% with global hypokinesis and abnormal septal motion worse at the inferior base, normal RV size with moderate RV dysfunction, moderate biatrial enlargement, moderate to severe MR, and severe TR. She was admitted in 10/2022 for a femoral neck fracture after an unwitnessed fall and underwent a hemiarthroplasty. Hospitalization was complicated by hospital delirium vs sundowning. It looks like her Eliquis was discontinued during this admission due to an intolerances and she subsequently decided to stop anticoagulation all together understanding the stroke risk with this. She was discharged to a SNF. She was most recently admitted from 12/07/2022 to 12/11/2022 with AKI and uremia felt to be secondary to dehydration after presenting with generalized weakness, poor PO intake, and confusion. BUN was 103 on arrival and creatinine was 1.49. Home Torsemide, Metolazone, and Spironolactone were held and she was treated with IV fluids with improvement in BUN and creatinine.  Cardiology was consulted and recommended decreasing Torsemide to 20mg  daily and holding Spironolactone for a few days (instructed to restart Spironolactone  12.5mg  on 12/13/2022) and only using Metolazone as needed. She was also found to have a UTI and was treated with antibiotics.    She was last seen by me  for a virtual visit on 12/25/2022 at which time she had some shortness of breath since diuretics were decreased at discharge. However, this improved with extra dose of Torsemide. Lower extremity edema had resolved. Torsemide was increased to 20mg  twice daily as renal function was stable.   Patient presents today for a virtual visit because it is very difficult for granddaughter to get patient to appointments. Initial tried a video visit but was unable to get the sound working; therefore, had to switch to a telephone visit. Patient is very hard of hearing so I spoke with granddaughter on the phone who lives with patient and is her caregiver. Patient is doing much better after increasing Torsemide to twice daily. She is no longer complaining of any shortness of breath. No chest pain, edema, palpitations, lightheadedness, dizziness, or syncope. She is unable to weight herself daily as she is only able to go from the bed to the chair at this time. However, she is working with PT and is getting a little stronger.  A home health RN is present at the time of the call and was trying to draw blood but was unsuccessful. Granddaughter states that a Charity fundraiser came out last week to do this but was unable to get any blood either. She has not had an labs checked since Torsemide was increased (although a repeat BMET was recommended 1 week after increasing this). Per home health RN, BP soft today at 92/52. However, she is not having any symptoms of hypotension. Patient's main complaint has been pain in bilaterally feet due to neuropathy. Granddaughter also states that she thinks patient has another yeast infection because she has been having a lot of itching lately. Home health RN is going to call in something for this.   Prior CV studies:    The following studies were reviewed:  Echocardiogram 09/04/2022: Impressions: 1. Global hypokinesis abnoraml septal motion worse at inferior base. Left  ventricular ejection fraction, by  estimation, is 40 to 45%. The left  ventricle has mildly decreased function. The left ventricle demonstrates  global hypokinesis. The left  ventricular internal cavity size was mildly dilated. Left ventricular  diastolic parameters are indeterminate.   2. Device lead in RA/RV . Right ventricular systolic function is  moderately reduced. The right ventricular size is normal.   3. Left atrial size was moderately dilated.   4. Right atrial size was moderately dilated.   5. The mitral valve is normal in structure. Moderate to severe mitral  valve regurgitation. No evidence of mitral stenosis.   6. Cannot r/o device lead impingement on TV leaflets as cause for severe  TR No 3D short axis images done . Tricuspid valve regurgitation is severe.   7. The aortic valve is tricuspid. There is mild calcification of the  aortic valve. There is mild thickening of the aortic valve. Aortic valve  regurgitation is not visualized. Aortic valve sclerosis is present, with  no evidence of aortic valve stenosis.   8. The inferior vena cava is dilated in size with <50% respiratory  variability, suggesting right atrial pressure of 15 mmHg.   Labs/Other Tests and Data Reviewed:    EKG:  Last EKG from 12/07/2022 showed V-paced rhythm.   Recent Labs: 09/03/2022: TSH 5.034 11/13/2022: B Natriuretic Peptide 1,168.1 12/08/2022: ALT 11 12/10/2022: BUN 38;  Creatinine, Ser 0.99; Hemoglobin 12.2; Magnesium 2.4; Platelets 213; Potassium 3.8; Sodium 139   Recent Lipid Panel Lab Results  Component Value Date/Time   CHOL 132 05/08/2022 02:21 PM   TRIG 86.0 05/08/2022 02:21 PM   HDL 39.50 05/08/2022 02:21 PM   CHOLHDL 3 05/08/2022 02:21 PM   LDLCALC 75 05/08/2022 02:21 PM   LDLDIRECT 163.3 01/30/2012 10:16 AM    Wt Readings from Last 3 Encounters:  01/21/23 145 lb (65.8 kg)  12/07/22 139 lb 12.4 oz (63.4 kg)  11/13/22 167 lb 8.8 oz (76 kg)     Objective:    Vital Signs:  BP (!) 92/52 Comment: per home health RN   Pulse 60   Ht 5' 2.5" (1.588 m)   Wt 145 lb (65.8 kg) Comment: estimate from granddaughter  SpO2 97%   BMI 26.10 kg/m    Vital Signs Reviewed. Patient is extremely hard of hearing so spoke with granddaughter on the phone.   ASSESSMENT & PLAN:    Chronic Combined CHF Patient has a history of chronic combined CHF. Last Echo in 09/2022 showed LVEF 40-45% with global hypokinesis and abnormal septal motion worse at the inferior base, normal RV size with moderate RV dysfunction, moderate biatrial enlargement, moderate to severe MR, and severe TR. She was recently admitted in 12/2022 for AKI and uremia in setting of dehydration and over-diuresis. She was treated with IV fluids and home diuretics were adjusted.  - Unable to assess volume status given virtual visit.  - Continue Torsemide 20mg  twice daily.  - Continue Spironolactone 12.5mg  daily. - Will stop Bisoprolol given soft BP. - Will hold off on SGLT2 inhibitor given recent yeast infection.  - We have unfortunately not been able to get a repeat BMET since Torsemide was increased at last visit.  A home health RN tried to draw blood last week and another one came out today but was unsuccessful. Asked granddaughter to push oral hydration for the next couple of days and then see if a home health RN can retry later this week. I just want to make sure her renal function is stable on higher dose of Torsemide.    Non-Obstructive CAD Remote cardiac catheterization in 2003 showed moderate non-obstructive CAD. Myoview in 2013 was negative.  - No chest pain.  - She has declined a statin in the past.  - She has not been on Aspirin. Given her advanced age, will not be overly aggressive with this.    Permanent Atrial Fibrillation Recent device interrogation showed permanent atrial fibrillation.  - Unable to assess rate control given virtual visit. However, patient has not complained of any palpitations.  - Will stop Bisoprolol given soft BP.  - She was  previously on Coumadin and more recently Eliquis but felt poorly after starting Eliquis.She ultimately made the decision of stopping anticoagulation therapy all together knowing the increased stroke risk with this. (This decision was based on long discussions patient and family had with Dr. Herbie Baltimore over the last couple of years).    Complete Heart Block S/p St Jude PPM in 05/2016. Recent device interrogation showed permanent atrial fibrillation.  - Followed by Dr. Nelly Laurence.    Moderate to Severe Mitral Regurgitation Severe Tricuspid Regurgitation Noted on recent Echo in 09/2022. Previously seen by Dr. Excell Seltzer and not felt to be a candidate for Mitraclip.  - Continue medical therapy. Continue diuresis for CHF as above.    Hypertension BP soft but stable. BP 92/52 today per home health RN.  -  Will stop Bisoprolol 2.5mg  daily. - Will continue Spironolactone 12.5mg  daily.    Hyperlipidemia - Patient has declined statins in the past.    CKD Stage III Recently admitted with AKI and uremia in 12/2022 in setting of dehydration and diuresis. However, renal function improved with IV fluids and holding of diuretics. Creatinine stable at 1.00 on 12/25/2022.  - Will try to repeat a BMET later this week as above   Time:   Today, I have spent 20 minutes with the patient with telehealth technology discussing the above problems.     Medication Adjustments/Labs and Tests Ordered: Current medicines are reviewed at length with the patient today.  Concerns regarding medicines are outlined above.   Follow Up:  Virtual Visit   in 3 months.  Leanne Lovely, PA-C  01/21/2023 4:42 PM     [facility]

## 2023-01-08 NOTE — Telephone Encounter (Signed)
Okay for orders.  We can recheck blood work to check kidney function.  Would recommend CMP, A1c for CKD and DM

## 2023-01-10 ENCOUNTER — Telehealth: Payer: Self-pay | Admitting: *Deleted

## 2023-01-10 DIAGNOSIS — R531 Weakness: Secondary | ICD-10-CM | POA: Diagnosis not present

## 2023-01-10 DIAGNOSIS — I5042 Chronic combined systolic (congestive) and diastolic (congestive) heart failure: Secondary | ICD-10-CM | POA: Diagnosis not present

## 2023-01-10 DIAGNOSIS — G9341 Metabolic encephalopathy: Secondary | ICD-10-CM | POA: Diagnosis not present

## 2023-01-10 NOTE — Progress Notes (Signed)
  Care Coordination Note  01/10/2023 Name: Beverly Kim MRN: [id number] DOB: January 22, 1925  Beverly Kim is a 89 y.o. year old female who is a primary care patient of Lawerance Bach, Bobette Mo, MD and is actively engaged with the care management team. I reached out to Beverly Kim by phone today to assist with re-scheduling a follow up visit with the RN Case Manager  Follow up plan: Unsuccessful telephone outreach attempt made. A HIPAA compliant phone message was left for the patient providing contact information and requesting a return call.   Burman Nieves, CCMA Care Coordination Care Guide Direct Dial: [phone]

## 2023-01-11 ENCOUNTER — Telehealth: Payer: Self-pay | Admitting: Internal Medicine

## 2023-01-11 DIAGNOSIS — L89302 Pressure ulcer of unspecified buttock, stage 2: Secondary | ICD-10-CM | POA: Diagnosis not present

## 2023-01-11 NOTE — Telephone Encounter (Signed)
Noted  

## 2023-01-11 NOTE — Telephone Encounter (Signed)
Beverly Kim called from [facility] wanting to inform Dr. Lawerance Bach that the pt had to miss her PT  appt today and they will scheduled for next for PT.

## 2023-01-17 NOTE — Progress Notes (Signed)
  Care Coordination Note  01/17/2023 Name: KAMAREA FELAND MRN: [id number] DOB: 01-22-1925  [name] is a 89 y.o. year old female who is a primary care patient of Lawerance Bach, Bobette Mo, MD and is actively engaged with the care management team. I reached out to [name] by phone today to assist with re-scheduling a follow up visit with the RN Case Manager  Follow up plan: Telephone appointment with care management team member scheduled for: 01/25/2023  Burman Nieves, [facility] Care Coordination Care Guide Direct Dial: [phone]

## 2023-01-21 ENCOUNTER — Telehealth: Payer: Self-pay | Admitting: Internal Medicine

## 2023-01-21 ENCOUNTER — Ambulatory Visit: Payer: PPO | Attending: Cardiology | Admitting: Student

## 2023-01-21 ENCOUNTER — Encounter: Payer: Self-pay | Admitting: Student

## 2023-01-21 VITALS — BP 92/52 | HR 60 | Ht 62.5 in | Wt 145.0 lb

## 2023-01-21 DIAGNOSIS — N183 Chronic kidney disease, stage 3 unspecified: Secondary | ICD-10-CM

## 2023-01-21 DIAGNOSIS — I2510 Atherosclerotic heart disease of native coronary artery without angina pectoris: Secondary | ICD-10-CM

## 2023-01-21 DIAGNOSIS — I10 Essential (primary) hypertension: Secondary | ICD-10-CM

## 2023-01-21 DIAGNOSIS — E785 Hyperlipidemia, unspecified: Secondary | ICD-10-CM | POA: Diagnosis not present

## 2023-01-21 DIAGNOSIS — I442 Atrioventricular block, complete: Secondary | ICD-10-CM | POA: Diagnosis not present

## 2023-01-21 DIAGNOSIS — I071 Rheumatic tricuspid insufficiency: Secondary | ICD-10-CM

## 2023-01-21 DIAGNOSIS — I5042 Chronic combined systolic (congestive) and diastolic (congestive) heart failure: Secondary | ICD-10-CM | POA: Diagnosis not present

## 2023-01-21 DIAGNOSIS — I340 Nonrheumatic mitral (valve) insufficiency: Secondary | ICD-10-CM

## 2023-01-21 DIAGNOSIS — I4821 Permanent atrial fibrillation: Secondary | ICD-10-CM

## 2023-01-21 MED ORDER — NYSTATIN 100000 UNIT/GM EX CREA: 1.0000 | TOPICAL_CREAM | Freq: Two times a day (BID) | CUTANEOUS | 5 refills | Status: DC

## 2023-01-21 NOTE — Patient Instructions (Signed)
Medication Instructions:  Stop Bisoprolol  *If you need a refill on your cardiac medications before your next appointment, please call your pharmacy*   Lab Work: No labs   Testing/Procedures: No testing   Follow-Up: At [facility], you and your health needs are our priority.  As part of our continuing mission to provide you with exceptional heart care, we have created designated Provider Care Teams.  These Care Teams include your primary Cardiologist (physician) and Advanced Practice Providers (APPs -  Physician Assistants and Nurse Practitioners) who all work together to provide you with the care you need, when you need it.  We recommend signing up for the patient portal called "MyChart".  Sign up information is provided on this After Visit Summary.  MyChart is used to connect with patients for Virtual Visits (Telemedicine).  Patients are able to view lab/test results, encounter notes, upcoming appointments, etc.  Non-urgent messages can be sent to your provider as well.   To learn more about what you can do with MyChart, go to [email or web address].    Your next appointment:   February 4th at 3:10pm with Marjie Skiff, PA-C

## 2023-01-21 NOTE — Telephone Encounter (Signed)
Nystatin cream sent to [facility]

## 2023-01-21 NOTE — Telephone Encounter (Signed)
Beverly Kim with [facility] - was unable to draw labs - they tried twice.  Nurse also thinks that she needs a script nystatin cream for a yeast infection.  Beverly Kim:  [phone]

## 2023-01-23 NOTE — Telephone Encounter (Signed)
Please call Beverly Kim.  I think she does not need palliative care which is someone who can help manage her chronic medical problems at home.  This can be transitioned into hospice when needed.  Let me know and I can order it.

## 2023-01-25 ENCOUNTER — Ambulatory Visit: Payer: Self-pay

## 2023-01-25 NOTE — Patient Outreach (Signed)
  Care Coordination   Follow Up Visit Note   01/25/2023 Name: Beverly Kim MRN: [id number] DOB: 1924-11-16  Beverly Kim is a 89 y.o. year old female who sees Burns, Bobette Mo, MD for primary care. I spoke with granddaughter, Beverly Kim (dpr) by phone today.  What matters to the patients health and wellness today?  Beverly Kim reports patient continues to be active with home health nursing and PT; patient's pain is managed and patient no longer has trouble with breathing or feeling pressure in chest. Video visit completed with cardiology on 01/21/23. She reports patient is eating good. Daughter states antifungal cream used for yeast infection vaginal/groin area and "bed sore" to right buttocks. She states home health nurse is monitoring. Daughter is interested in [facility] in home provider service information.   Goals Addressed             This Visit's Progress    Assist with health management       Interventions Today    Flowsheet Row Most Recent Value  Chronic Disease   Chronic disease during today's visit Other  [12/07/22-12/11/22 acute metabolic encephalopathy/dehydration]  General Interventions   General Interventions Discussed/Reviewed General Interventions Reviewed, [facility], Doctor Visits, Communication with  Doctor Visits Discussed/Reviewed Doctor Visits Discussed  [discussed in home provider [facility]]  Communication with PCP/Specialists  [facility] spoke with Lawson Fiscal with [facility] [phone]) re: interest in [facility] Provider servies.]  Education Interventions   Education Provided Provided Education  Provided Verbal Education On Nutrition, Medication, When to see the doctor  [encouraged continue to eat healthy, take medications as prescribed. contact provider with health questions or concerns. discussed skin care.]  Nutrition Interventions   Nutrition Discussed/Reviewed Nutrition Reviewed  Pharmacy Interventions   Pharmacy  Dicussed/Reviewed Pharmacy Topics Reviewed  Safety Interventions   Safety Discussed/Reviewed Fall Risk, Home Safety  [confirmed patient continues to be active with home health services]            SDOH assessments and interventions completed:  No  Care Coordination Interventions:  Yes, provided   Follow up plan:  RNCM will continue to follow    Encounter Outcome:  Patient Visit Completed   Kathyrn Sheriff, RN, MSN, BSN, CCM Care Management Coordinator [phone]

## 2023-01-25 NOTE — Telephone Encounter (Signed)
Left message for Beverly Kim today Radio broadcast assistant)

## 2023-01-25 NOTE — Patient Instructions (Signed)
Visit Information  Thank you for taking time to visit with me today. Please don't hesitate to contact me if I can be of assistance to you.   Following are the goals we discussed today:  Continue to take medications as prescribed. Continue to attend provider visits as scheduled Continue to eat healthy, lean meats, vegetables, fruits, avoid saturated and transfats Contact provider with health questions or concerns as needed Continue to work with home health nurse and therapist as recommended  Someone will call you to schedule our next telephone call.  If you are experiencing a Mental Health or Behavioral Health Crisis or need someone to talk to, please call the Suicide and Crisis Lifeline: 988 call the [place] National Suicide Prevention Lifeline: 236-062-3177 or TTY: 786-004-9267 TTY (779)436-4525) to talk to a trained counselor call 1-800-273-TALK (toll free, 24 hour hotline)  Kathyrn Sheriff, RN, MSN, BSN, CCM Care Management Coordinator [phone]

## 2023-01-30 ENCOUNTER — Other Ambulatory Visit: Payer: Self-pay | Admitting: Internal Medicine

## 2023-02-06 DIAGNOSIS — I5042 Chronic combined systolic (congestive) and diastolic (congestive) heart failure: Secondary | ICD-10-CM | POA: Diagnosis not present

## 2023-02-06 DIAGNOSIS — E1140 Type 2 diabetes mellitus with diabetic neuropathy, unspecified: Secondary | ICD-10-CM | POA: Diagnosis not present

## 2023-02-06 DIAGNOSIS — E039 Hypothyroidism, unspecified: Secondary | ICD-10-CM | POA: Diagnosis not present

## 2023-02-06 DIAGNOSIS — J45991 Cough variant asthma: Secondary | ICD-10-CM | POA: Diagnosis not present

## 2023-02-06 DIAGNOSIS — K219 Gastro-esophageal reflux disease without esophagitis: Secondary | ICD-10-CM | POA: Diagnosis not present

## 2023-02-06 DIAGNOSIS — S72041D Displaced fracture of base of neck of right femur, subsequent encounter for closed fracture with routine healing: Secondary | ICD-10-CM | POA: Diagnosis not present

## 2023-02-06 DIAGNOSIS — I130 Hypertensive heart and chronic kidney disease with heart failure and stage 1 through stage 4 chronic kidney disease, or unspecified chronic kidney disease: Secondary | ICD-10-CM | POA: Diagnosis not present

## 2023-02-06 DIAGNOSIS — N1831 Chronic kidney disease, stage 3a: Secondary | ICD-10-CM | POA: Diagnosis not present

## 2023-02-06 DIAGNOSIS — I2510 Atherosclerotic heart disease of native coronary artery without angina pectoris: Secondary | ICD-10-CM | POA: Diagnosis not present

## 2023-02-06 DIAGNOSIS — E785 Hyperlipidemia, unspecified: Secondary | ICD-10-CM | POA: Diagnosis not present

## 2023-02-06 DIAGNOSIS — I480 Paroxysmal atrial fibrillation: Secondary | ICD-10-CM | POA: Diagnosis not present

## 2023-02-06 DIAGNOSIS — E1122 Type 2 diabetes mellitus with diabetic chronic kidney disease: Secondary | ICD-10-CM | POA: Diagnosis not present

## 2023-02-08 ENCOUNTER — Ambulatory Visit (INDEPENDENT_AMBULATORY_CARE_PROVIDER_SITE_OTHER): Payer: PPO

## 2023-02-08 VITALS — Ht 62.0 in | Wt 145.0 lb

## 2023-02-08 DIAGNOSIS — Z0000 Encounter for general adult medical examination without abnormal findings: Secondary | ICD-10-CM

## 2023-02-08 NOTE — Progress Notes (Signed)
Subjective:   Beverly Kim is a 89 y.o. female who presents for Medicare Annual (Subsequent) preventive examination.  Visit Complete: Virtual I connected with  [name] on 02/08/23 by a audio enabled telemedicine application and verified that I am speaking with the correct person using two identifiers.  Patient Location: Home  Provider Location: Office/Clinic  I discussed the limitations of evaluation and management by telemedicine. The patient expressed understanding and agreed to proceed.  Vital Signs: Because this visit was a virtual/telehealth visit, some criteria may be missing or patient reported. Any vitals not documented were not able to be obtained and vitals that have been documented are patient reported.  Cardiac Risk Factors include: advanced age (>39men, >34 women);hypertension;Other (see comment);diabetes mellitus, Risk factor comments: CAD, CKD     Objective:    Today's Vitals   02/08/23 1332  Weight: 145 lb (65.8 kg)  Height: 5\' 2"  (1.575 m)   Body mass index is 26.52 kg/m.     02/08/2023    1:54 PM 12/07/2022    7:39 PM 12/07/2022    4:00 PM 11/13/2022   12:27 PM 11/13/2022   12:25 PM 10/11/2022    9:30 PM 10/03/2022    4:41 PM  Advanced Directives  Does Patient Have a Medical Advance Directive? Yes No No No No No No  Type of Estate agent of [place];Living will        Copy of Healthcare Power of Attorney in Chart? No - copy requested        Would patient like information on creating a medical advance directive?  No - Patient declined    No - Patient declined No - Patient declined    Current Medications (verified) Outpatient Encounter Medications as of 02/08/2023  Medication Sig   acetaminophen (TYLENOL) 325 MG tablet Take 650 mg by mouth every 8 (eight) hours.   ketoconazole (NIZORAL) 2 % cream APPLY 1 APPLICATION TOPICALLY DAILY   Nutritional Supplements (NUTRITIONAL DRINK PO) Take 120 mLs by mouth 2 (two) times daily.    nystatin cream (MYCOSTATIN) Apply 1 Application topically 2 (two) times daily.   pantoprazole (PROTONIX) 40 MG tablet Take 1 tablet (40 mg total) by mouth daily.   polyethylene glycol (MIRALAX / GLYCOLAX) 17 g packet Take 17 g by mouth 2 (two) times daily.   pregabalin (LYRICA) 25 MG capsule Take 25 mg by mouth 3 (three) times daily.   SENNA PO Take 17.2 mg by mouth 2 (two) times daily.   sertraline (ZOLOFT) 25 MG tablet Take 1 tablet (25 mg total) by mouth daily.   spironolactone (ALDACTONE) 25 MG tablet Take 0.5 tablets (12.5 mg total) by mouth daily.   SYNTHROID 112 MCG tablet Take 1 tablet (112 mcg total) by mouth daily before breakfast.   torsemide (DEMADEX) 20 MG tablet Take 1 tablet (20 mg total) by mouth daily. TAKE TORSEMIDE 20MG  DAILY; IF WORSENING SHORTNESS OF BREATH YOUR MAY TAKE AN EXTRA 20MG  IN THE AFTERNOON   traZODone (DESYREL) 50 MG tablet Take 50 mg by mouth at bedtime.   No facility-administered encounter medications on file as of 02/08/2023.    Allergies (verified) Adhesive [tape], Ace inhibitors, Atorvastatin, Clindamycin, Codeine, Latex, Shellfish allergy, Simvastatin, and Penicillins   History: Past Medical History:  Diagnosis Date   Acute blood loss anemia 09/04/2022   Cardiac pacemaker 05/2016   Sick sinus syndrome/complete heart block   Chronic heart failure with preserved ejection fraction (HFpEF) (HCC)    Colon polyps  Diverticulosis    Dyspnea    GERD (gastroesophageal reflux disease)    Guaiac positive stools 09/04/2022   Hearing loss of both ears    Hepatic cyst    Hiatal hernia    Hyperlipidemia    Hypertension    Hypothyroidism    Low back pain    Non-occlusive coronary artery disease 04/2001   Cardiac Cath 04/18/2001: 50 and 70% mid LAD after D1. = Nonischemic by Myoview.  Medical management.   Other left bundle branch block    PAF (paroxysmal atrial fibrillation) (HCC) 08/16/2016   observed on PPM interrogation, asymptomatic, chad2vasc score  is 5.  Bisoprolol for rate control, Xarelto for anticoagulation.   Rectal bleeding 09/04/2022   URI (upper respiratory infection)    Past Surgical History:  Procedure Laterality Date   BIOPSY  09/05/2022   Procedure: BIOPSY;  Surgeon: Meridee Score Netty Starring., MD;  Location[facility] ENDOSCOPY;  Service: Gastroenterology;;   CARDIOVERSION N/A 01/16/2019   Procedure: CARDIOVERSION;  Surgeon: Wendall Stade, MD;  Location: [facility] ENDOSCOPY;  Service: Cardiovascular;  Laterality: N/A;   CHOLECYSTECTOMY N/A 12/15/2019   Procedure: LAPAROSCOPIC CHOLECYSTECTOMY;  Surgeon: Almond Lint, MD;  Location: [facility] OR;  Service: General;  Laterality: N/A;   COLONOSCOPY     COLONOSCOPY N/A 09/05/2022   Procedure: COLONOSCOPY;  Surgeon: Lemar Lofty., MD;  Location: [facility] ENDOSCOPY;  Service: Gastroenterology;  Laterality: N/A;   ERCP N/A 07/21/2019   Procedure: ENDOSCOPIC RETROGRADE CHOLANGIOPANCREATOGRAPHY (ERCP);  Surgeon: Rachael Fee, MD;  Location: [facility] ENDOSCOPY;  Service: Endoscopy;  Laterality: N/A;   HERNIA REPAIR     HIP ARTHROPLASTY Right 10/11/2022   Procedure: ARTHROPLASTY BIPOLAR HIP (HEMIARTHROPLASTY);  Surgeon: Myrene Galas, MD;  Location: [facility] OR;  Service: Orthopedics;  Laterality: Right;   HOT HEMOSTASIS N/A 09/05/2022   Procedure: HOT HEMOSTASIS (ARGON PLASMA COAGULATION/BICAP);  Surgeon: Lemar Lofty., MD;  Location: [facility] ENDOSCOPY;  Service: Gastroenterology;  Laterality: N/A;   LEFT HEART CATH AND CORONARY ANGIOGRAPHY  04/18/2001   Dr. Riley Kill: EF 60 to 65%.  2+ MR.  Upper LM takeoff with 90 degree bend just inside the ostium.  50-70% mid LAD after D1.  D1 is 30%.    Distal LAD wraps the apex and is free of disease.  LCx is mostly large OM branch.  RCA provides PDA and PL branches and Free of disease. -->  Presumably evaluated by Myoview that was nonischemic, therefore no PCI performed.   NM MYOVIEW LTD  01/2012   Normal EF.  Normal study.  No ischemia or infarction.   PACEMAKER IMPLANT  Left 05/15/2016   SJM Assirity MRI dual chamber PPM implanted by Dr Johney Frame for CHB   POLYPECTOMY  09/05/2022   Procedure: POLYPECTOMY;  Surgeon: Mansouraty, Netty Starring., MD;  Location: [facility] ENDOSCOPY;  Service: Gastroenterology;;   REMOVAL OF STONES  07/21/2019   Procedure: REMOVAL OF STONES;  Surgeon: Rachael Fee, MD;  Location: [facility] ENDOSCOPY;  Service: Endoscopy;;   SPHINCTEROTOMY  07/21/2019   Procedure: Dennison Mascot;  Surgeon: Rachael Fee, MD;  Location: [facility] ENDOSCOPY;  Service: Endoscopy;;   THYROIDECTOMY     TONSILLECTOMY AND ADENOIDECTOMY     TOTAL ABDOMINAL HYSTERECTOMY W/ BILATERAL SALPINGOOPHORECTOMY     TRANSTHORACIC ECHOCARDIOGRAM  06/27/2016   Moderate basal-septal LVH, EF 60 to 65%.  No R WMA.  GR 1 DD.  Mild aortic valve calcification.  No AS.  Trivial MR.--Indicated improved EF compared to prior study in 2015.   TRANSTHORACIC ECHOCARDIOGRAM  02/22/2012  12/'13 -a) EF 50 and 55%.  Normal function.-Moderate MR.  Mild LA dilation.; b) 11/2015Echo for shortness of breath => reduced EF of 45 to 50%.  Mild LVH.  Inferior reversible HK.  GRII DD.  Moderate MR.   TRANSTHORACIC ECHOCARDIOGRAM  10/2019   EF 60 -65%.  No RWMA.  Severe concentric LVH.  Unable to assess diastolic function.  Mild biatrial dilation.  Moderate MR moderate TR, no AS   TRANSTHORACIC ECHOCARDIOGRAM  06/15/2021   EF 40 to 45%.  Paradoxical septal motion from pacemaker.  Mild concentric LVH.  Indeterminate diastolic pressure.  Severe biatrial enlargement would argue significant diastolic dysfunction.  Severe MR and severe TR. => EF down from 60 to 65%.   Family History  Problem Relation Age of Onset   Coronary artery disease Other        family hx of 1st degree relative <50   Diabetes Other        family hx of   Other Other        cardiovascular disorder family hx of   Other Other        neurological disorder family hx of   Other Other        respiratory disease family hx of   Heart attack Daughter     Heart Problems Other        all children   Sudden death Son    Heart disease Brother    Heart disease Sister    Colon cancer Neg Hx    Colon polyps Neg Hx    Social History   Socioeconomic History   Marital status: Widowed    Spouse name: Not on file   Number of children: 4   Years of education: Not on file   Highest education level: Not on file  Occupational History   Occupation: retired  Tobacco Use   Smoking status: Never   Smokeless tobacco: Never  Vaping Use   Vaping status: Never Used  Substance and Sexual Activity   Alcohol use: No   Drug use: No   Sexual activity: Never  Other Topics Concern   Not on file  Social History Narrative   Lives with granddaughter Marylene Land)   Social Determinants of Health   Financial Resource Strain: Low Risk  (09/11/2019)   Overall Financial Resource Strain (CARDIA)    Difficulty of Paying Living Expenses: Not hard at all  Food Insecurity: No Food Insecurity (12/07/2022)   Hunger Vital Sign    Worried About Running Out of Food in the Last Year: Never true    Ran Out of Food in the Last Year: Never true  Transportation Needs: No Transportation Needs (02/08/2023)   PRAPARE - Administrator, Civil Service (Medical): No    Lack of Transportation (Non-Medical): No  Physical Activity: Insufficiently Active (02/08/2023)   Exercise Vital Sign    Days of Exercise per Week: 2 days    Minutes of Exercise per Session: 60 min  Stress: No Stress Concern Present (02/08/2023)   [employer] of Occupational Health - Occupational Stress Questionnaire    Feeling of Stress : Not at all  Social Connections: Socially Isolated (02/08/2023)   Social Connection and Isolation Panel [NHANES]    Frequency of Communication with Friends and Family: More than three times a week    Frequency of Social Gatherings with Friends and Family: Twice a week    Attends Religious Services: Never    Database administrator or Organizations:  No    Attends  Banker Meetings: Never    Marital Status: Widowed    Tobacco Counseling Counseling given: Not Answered   Clinical Intake:  Pre-visit preparation completed: Yes  Pain : No/denies pain     BMI - recorded: 26.52 Nutritional Status: BMI 25 -29 Overweight Diabetes: Yes CBG done?: No Did pt. bring in CBG monitor from home?: No  How often do you need to have someone help you when you read instructions, pamphlets, or other written materials from your doctor or pharmacy?: 4 - Often  Interpreter Needed?: Yes (Spoke with grandaughter)  Comments: Patient's granddaughter Marylene Land) did the visit today. Information entered by :: Livan Hires, RMA   Activities of Daily Living    02/08/2023    2:17 PM 12/07/2022    7:39 PM  In your present state of health, do you have any difficulty performing the following activities:  Hearing? 1 1  Vision? 1 0  Difficulty concentrating or making decisions? 0 1  Walking or climbing stairs? 1   Dressing or bathing? 1   Doing errands, shopping? 1 1  Preparing Food and eating ? Y   Using the Toilet? Y   In the past six months, have you accidently leaked urine? N   Do you have problems with loss of bowel control? N   Managing your Medications? Y   Managing your Finances? Y   Housekeeping or managing your Housekeeping? Y     Patient Care Team: Pincus Sanes, MD as PCP - General (Internal Medicine) Marykay Lex, MD as PCP - Cardiology (Cardiology) Rollene Rotunda, MD as Consulting Physician (Cardiology) Nyoka Cowden, MD as Consulting Physician (Pulmonary Disease) Colletta Maryland, RN as [facility] Care Management Mckinley Jewel, MD as Attending Physician (Ophthalmology)  Indicate any recent Medical Services you may have received from other than [facility] providers in the past year (date may be approximate).     Assessment:   This is a routine wellness examination for Taura.  Hearing/Vision screen Hearing  Screening - Comments:: Wears one hearing aide in rt ear Vision Screening - Comments:: Wears eyeglasses   Goals Addressed   None   Depression Screen    02/08/2023    2:00 PM 09/11/2019    3:56 PM 08/19/2018   10:35 AM 07/19/2017   10:53 AM 01/15/2017   11:29 AM 03/16/2016    1:10 PM 01/03/2015   11:07 AM  PHQ 2/9 Scores  PHQ - 2 Score 1 0 0 0 0 0 0  PHQ- 9 Score 4   3       Fall Risk    02/08/2023    1:54 PM 05/08/2022    1:29 PM 01/04/2021    1:50 PM 09/11/2019    3:57 PM 08/19/2018   10:35 AM  Fall Risk   Falls in the past year? 1 0 0 0 1  Number falls in past yr: 0 0 0 0 1  Injury with Fall? 1 0 0 0 1  Risk for fall due to :  No Fall Risks No Fall Risks Impaired balance/gait Impaired vision  Follow up Falls evaluation completed;Falls prevention discussed Falls evaluation completed Falls evaluation completed Falls evaluation completed Education provided;Falls prevention discussed    MEDICARE RISK AT HOME: Medicare Risk at Home Any stairs in or around the home?: Yes (has an outside ramp) Home free of loose throw rugs in walkways, pet beds, electrical cords, etc?: Yes Adequate lighting in your home to  reduce risk of falls?: Yes Life alert?: No Use of a cane, walker or w/c?: Yes (w/c) Grab bars in the bathroom?: Yes Shower chair or bench in shower?: Yes (per pt's daught-pt just has bed baths) Elevated toilet seat or a handicapped toilet?: Yes  TIMED UP AND GO:  Was the test performed?  No    Cognitive Function:    09/11/2019    4:00 PM 07/19/2017   12:37 PM 01/15/2017   12:30 PM  MMSE - Mini Mental State Exam  Not completed: Unable to complete    Orientation to time  5 5  Orientation to Place  5 5  Registration  3 3  Attention/ Calculation  4 4  Recall  1 1  Language- name 2 objects  2 2  Language- repeat  1 1  Language- follow 3 step command  3 3  Language- read & follow direction  1 1  Write a sentence  1 1  Copy design  1 1  Total score  27 27         02/08/2023    1:32 PM  6CIT Screen  What Year? 0 points  What month? 0 points  What time? 0 points  Count back from 20 0 points  Months in reverse --  Repeat phrase --    Immunizations Immunization History  Administered Date(s) Administered   Influenza Split 12/25/2010, 12/25/2011   Influenza Whole 03/05/2005, 12/13/2008   Influenza, High Dose Seasonal PF 12/16/2012, 01/15/2017   PFIZER(Purple Top)SARS-COV-2 Vaccination 05/03/2019, 06/03/2019   Pneumococcal Polysaccharide-23 03/05/2002, 08/12/2007   Td 03/06/2003   Zoster, Live 08/12/2007    TDAP status: Due, Education has been provided regarding the importance of this vaccine. Advised may receive this vaccine at local pharmacy or Health Dept. Aware to provide a copy of the vaccination record if obtained from local pharmacy or Health Dept. Verbalized acceptance and understanding.  Flu Vaccine status: Declined, Education has been provided regarding the importance of this vaccine but patient still declined. Advised may receive this vaccine at local pharmacy or Health Dept. Aware to provide a copy of the vaccination record if obtained from local pharmacy or Health Dept. Verbalized acceptance and understanding.  Pneumococcal vaccine status: Declined,  Education has been provided regarding the importance of this vaccine but patient still declined. Advised may receive this vaccine at local pharmacy or Health Dept. Aware to provide a copy of the vaccination record if obtained from local pharmacy or Health Dept. Verbalized acceptance and understanding.   Covid-19 vaccine status: Declined, Education has been provided regarding the importance of this vaccine but patient still declined. Advised may receive this vaccine at local pharmacy or Health Dept.or vaccine clinic. Aware to provide a copy of the vaccination record if obtained from local pharmacy or Health Dept. Verbalized acceptance and understanding.  Qualifies for Shingles Vaccine? Yes    Zostavax completed Yes   Shingrix Completed?: No.    Education has been provided regarding the importance of this vaccine. Patient has been advised to call insurance company to determine out of pocket expense if they have not yet received this vaccine. Advised may also receive vaccine at local pharmacy or Health Dept. Verbalized acceptance and understanding.  Screening Tests Health Maintenance  Topic Date Due   OPHTHALMOLOGY EXAM  05/05/2021   HEMOGLOBIN A1C  11/08/2022   COVID-19 Vaccine (3 - Pfizer risk series) 02/24/2023 (Originally 07/01/2019)   Zoster Vaccines- Shingrix (1 of 2) 05/09/2023 (Originally 09/27/1943)   INFLUENZA VACCINE  06/03/2023 (  Originally 10/04/2022)   DEXA SCAN  11/21/2023 (Originally 09/28/1991)   DTaP/Tdap/Td (2 - Tdap) 02/08/2024 (Originally 03/05/2013)   Pneumonia Vaccine 62+ Years old (3 of 3 - PCV) 02/08/2024 (Originally 08/11/2008)   Medicare Annual Wellness (AWV)  02/08/2024   HPV VACCINES  Aged Out   FOOT EXAM  Discontinued    Health Maintenance  Health Maintenance Due  Topic Date Due   OPHTHALMOLOGY EXAM  05/05/2021   HEMOGLOBIN A1C  11/08/2022    Colorectal cancer screening: No longer required.   Mammogram status: No longer required due to age.  Lung Cancer Screening: (Low Dose CT Chest recommended if Age 15-80 years, 20 pack-year currently smoking OR have quit w/in 15years.) does not qualify.   Lung Cancer Screening Referral: N/A  Additional Screening:  Hepatitis C Screening: does not qualify;  Vision Screening: Recommended annual ophthalmology exams for early detection of glaucoma and other disorders of the eye. Is the patient up to date with their annual eye exam?  No  Who is the provider or what is the name of the office in which the patient attends annual eye exams? Dr. Dagoberto Ligas If pt is not established with a provider, would they like to be referred to a provider to establish care? No .   Dental Screening: Recommended annual dental  exams for proper oral hygiene  Community Resource Referral / Chronic Care Management: CRR required this visit?  No   CCM required this visit?  No     Plan:     I have personally reviewed and noted the following in the patient's chart:   Medical and social history Use of alcohol, tobacco or illicit drugs  Current medications and supplements including opioid prescriptions. Patient is not currently taking opioid prescriptions. Functional ability and status Nutritional status Physical activity Advanced directives List of other physicians Hospitalizations, surgeries, and ER visits in previous 12 months Vitals Screenings to include cognitive, depression, and falls Referrals and appointments  In addition, I have reviewed and discussed with patient certain preventive protocols, quality metrics, and best practice recommendations. A written personalized care plan for preventive services as well as general preventive health recommendations were provided to patient.     Arzella Rehmann L Dezyre Hoefer, CMA   02/08/2023   After Visit Summary: (MyChart) Due to this being a telephonic visit, the after visit summary with patients personalized plan was offered to patient via MyChart   Nurse Notes: Patient's granddaughter did visit for, as patient can not hear well.  Patient's granddaughter, Marylene Land informed me that patient is not getting around that well, so they do not take her out much.  All vaccines have been declined.  There were no concerns addressed today.

## 2023-02-08 NOTE — Patient Instructions (Addendum)
Beverly Kim , Thank you for taking time to come for your Medicare Wellness Visit. I appreciate your ongoing commitment to your health goals. Please review the following plan we discussed and let me know if I can assist you in the future.   Referrals/Orders/Follow-Ups/Clinician Recommendations: [name].  This is a list of the screening recommended for you and due dates:  Health Maintenance  Topic Date Due   Eye exam for diabetics  05/05/2021   Hemoglobin A1C  11/08/2022   COVID-19 Vaccine (3 - Pfizer risk series) 02/24/2023*   Zoster (Shingles) Vaccine (1 of 2) 05/09/2023*   Flu Shot  06/03/2023*   DEXA scan (bone density measurement)  11/21/2023*   DTaP/Tdap/Td vaccine (2 - Tdap) 02/08/2024*   Pneumonia Vaccine (3 of 3 - PCV) 02/08/2024*   Medicare Annual Wellness Visit  02/08/2024   HPV Vaccine  Aged Out   Complete foot exam   Discontinued  *Topic was postponed. The date shown is not the original due date.    Advanced directives: (Copy Requested) Please bring a copy of your health care power of attorney and living will to the office to be added to your chart at your convenience.  Next Medicare Annual Wellness Visit scheduled for next year: No

## 2023-02-09 DIAGNOSIS — G9341 Metabolic encephalopathy: Secondary | ICD-10-CM | POA: Diagnosis not present

## 2023-02-09 DIAGNOSIS — I5042 Chronic combined systolic (congestive) and diastolic (congestive) heart failure: Secondary | ICD-10-CM | POA: Diagnosis not present

## 2023-02-09 DIAGNOSIS — R531 Weakness: Secondary | ICD-10-CM | POA: Diagnosis not present

## 2023-03-07 ENCOUNTER — Other Ambulatory Visit: Payer: Self-pay | Admitting: Internal Medicine

## 2023-03-11 ENCOUNTER — Other Ambulatory Visit: Payer: Self-pay | Admitting: Internal Medicine

## 2023-03-11 DIAGNOSIS — R1011 Right upper quadrant pain: Secondary | ICD-10-CM

## 2023-03-11 DIAGNOSIS — K219 Gastro-esophageal reflux disease without esophagitis: Secondary | ICD-10-CM

## 2023-03-12 ENCOUNTER — Encounter: Payer: Self-pay | Admitting: Internal Medicine

## 2023-03-12 DIAGNOSIS — I5042 Chronic combined systolic (congestive) and diastolic (congestive) heart failure: Secondary | ICD-10-CM | POA: Diagnosis not present

## 2023-03-12 DIAGNOSIS — G9341 Metabolic encephalopathy: Secondary | ICD-10-CM | POA: Diagnosis not present

## 2023-03-12 DIAGNOSIS — R531 Weakness: Secondary | ICD-10-CM | POA: Diagnosis not present

## 2023-03-13 ENCOUNTER — Telehealth: Payer: PPO | Admitting: Nurse Practitioner

## 2023-03-13 DIAGNOSIS — J40 Bronchitis, not specified as acute or chronic: Secondary | ICD-10-CM

## 2023-03-13 MED ORDER — BENZONATATE 100 MG PO CAPS
100.0000 mg | ORAL_CAPSULE | Freq: Two times a day (BID) | ORAL | 0 refills | Status: DC | PRN
Start: 2023-03-13 — End: 2023-04-09

## 2023-03-13 MED ORDER — DOXYCYCLINE HYCLATE 100 MG PO TABS
100.0000 mg | ORAL_TABLET | Freq: Two times a day (BID) | ORAL | 0 refills | Status: DC
Start: 2023-03-13 — End: 2023-05-02

## 2023-03-13 MED ORDER — ALBUTEROL SULFATE HFA 108 (90 BASE) MCG/ACT IN AERS
2.0000 | INHALATION_SPRAY | Freq: Four times a day (QID) | RESPIRATORY_TRACT | 0 refills | Status: DC | PRN
Start: 2023-03-13 — End: 2023-04-09

## 2023-03-13 NOTE — Progress Notes (Signed)
 Established Patient Office Visit  An audio/visual tele-health visit was completed today for this patient. I connected with  Beverly Kim on 03/13/23 utilizing audio/visual technology and verified that I am speaking with the correct person using two identifiers. The patient was located at their home, and I was located at the office of [facility] at [facility] during the encounter. I discussed the limitations of evaluation and management by telemedicine. The patient expressed understanding and agreed to proceed.     Subjective   Patient ID: Beverly Kim, female    DOB: May 16, 1924  Age: 89 y.o. MRN: [id number]  Chief Complaint  Patient presents with   Cough    Cough started on Sunday and then the diarrhea, have been taking delsym    Patient has today for virtual visit accompanied by her granddaughter [name] who assists in the visit.  She reports for about 2 to 3 days the patient has been experiencing productive cough, body aches, diarrhea, shortness of breath, wheezing, and chills.  The patient does have a history of asthma.  They were exposed to a sick family member over the weekend before symptoms started.  There has not been official respiratory testing to rule in or rule out flu, COVID, RSV.  The patient is 89 years of age and family members concerned about pneumonia.  Patient is able to get on video and reports that she generally just feels very unwell and quite short of breath at rest.  No vital signs available during visit.    Review of Systems  Constitutional:  Positive for chills. Negative for fever.  Respiratory:  Positive for cough, sputum production, shortness of breath and wheezing.       Objective:     There were no vitals taken for this visit.   Physical Exam Comprehensive physical exam not completed today as office visit was conducted remotely.  No evidence of respiratory distress noted, patient is able to speak in complete sentences without having  to stop to breathe, no audible wheezing noted on video, granddaughter reports she does not feel patient is breathing excessively fast or having a hard time breathing.  Patient was alert and oriented, and appeared to have appropriate judgment.   No results found for any visits on 03/13/23.    The ASCVD Risk score (Arnett DK, et al., 2019) failed to calculate for the following reasons:   The 2019 ASCVD risk score is only valid for ages 63 to 35    Assessment & Plan:   Problem List Items Addressed This Visit       Respiratory   Bronchitis - Primary   Acute Possibly viral in etiology, however patient is high risk for significant morbidity and mortality if this were to be bacterial infection.  Unfortunately unable to perform point-of-care testing to rule in or rule out COVID, RSV, flu as this is a virtual visit today.  Due to patient's comorbidities will treat with course of antibiotics (doxycycline  100 mg twice daily x 10 days), as needed Tessalon  Perles, and as needed albuterol  inhaler.  Patient does have history of GI bleeds we will hold off on steroid use for now.  Patient was also encouraged to use over-the-counter Mucinex  as needed as well.  We did discuss risk associated with doxycycline  including C. difficile infection, we also discussed the potential severity if C. difficile were to occur and that if the patient starts to experience increased episodes of diarrhea she needs to be seen in the  office. We discussed the importance of hydrating well during the day with recommendations to take frequent sips of water and consider including Pedialyte once a day to help replenish electrolytes since patient reports having some recent loose stools.  Patient and granddaughter encouraged to call office if symptoms persist or do not improve.  They report their understanding.      Relevant Medications   doxycycline  (VIBRA -TABS) 100 MG tablet   albuterol  (VENTOLIN  HFA) 108 (90 Base) MCG/ACT inhaler    benzonatate  (TESSALON ) 100 MG capsule    Return if symptoms worsen or fail to improve.    Lauraine FORBES Pereyra, NP

## 2023-03-13 NOTE — Assessment & Plan Note (Addendum)
 Acute Possibly viral in etiology, however patient is high risk for significant morbidity and mortality if this were to be bacterial infection.  Unfortunately unable to perform point-of-care testing to rule in or rule out COVID, RSV, flu as this is a virtual visit today.  Due to patient's comorbidities will treat with course of antibiotics (doxycycline  100 mg twice daily x 10 days), as needed Tessalon  Perles, and as needed albuterol  inhaler.  Patient does have history of GI bleeds we will hold off on steroid use for now.  Patient was also encouraged to use over-the-counter Mucinex  as needed as well.  We did discuss risk associated with doxycycline  including C. difficile infection, we also discussed the potential severity if C. difficile were to occur and that if the patient starts to experience increased episodes of diarrhea she needs to be seen in the office. We discussed the importance of hydrating well during the day with recommendations to take frequent sips of water and consider including Pedialyte once a day to help replenish electrolytes since patient reports having some recent loose stools.  Patient and granddaughter encouraged to call office if symptoms persist or do not improve.  They report their understanding.

## 2023-03-26 ENCOUNTER — Telehealth: Payer: Self-pay | Admitting: Internal Medicine

## 2023-03-26 MED ORDER — SACCHAROMYCES BOULARDII 250 MG PO CAPS: 250.0000 mg | ORAL_CAPSULE | Freq: Two times a day (BID) | ORAL | 5 refills | Status: DC

## 2023-03-26 NOTE — Telephone Encounter (Signed)
Attempted to reach Beverly Kim to obtain more information, requesting something for yeast

## 2023-03-26 NOTE — Telephone Encounter (Signed)
Copied from CRM [id number]. Topic: Clinical - Home Health Verbal Orders >> Mar 26, 2023  2:31 PM Joanette Gula wrote: Caller/Agency: Susie w/ [facility] Callback Number: [phone] Service Requested: Med Request Frequency:  Any new concerns about the patient? Yes  Pt has a lot of Yeast and needs a Probiotic called in.

## 2023-03-26 NOTE — Telephone Encounter (Signed)
Spoke with Beverly Kim, she is making patient and her daughter aware of this being ordered. If not covered they will let us know

## 2023-03-26 NOTE — Telephone Encounter (Signed)
Probiotic pill sent to pharmacy-I am not sure if this will be covered or not by her insurance.

## 2023-03-28 ENCOUNTER — Telehealth: Payer: Self-pay

## 2023-03-28 NOTE — Patient Outreach (Signed)
  Care Coordination   03/28/2023 Name: SHIZUKA EIKE MRN: [id number] DOB: Jun 22, 1924   Care Coordination Outreach Attempts:  An unsuccessful telephone outreach was attempted today to offer the patient information about available complex care management services.  Follow Up Plan:  Additional outreach attempts will be made to offer the patient complex care management information and services.   Encounter Outcome:  No Answer   Care Coordination Interventions:  No, not indicated    Kathyrn Sheriff, RN, MSN, BSN, CCM [facility], Population Health Case Manager Phone: [phone]

## 2023-03-31 NOTE — Progress Notes (Signed)
 Virtual Visit via Video Note   Because of Kaitrin B Orzel's co-morbid illnesses, she is at least at moderate risk for complications without adequate follow up.  This format is felt to be most appropriate for this patient at this time.  All issues noted in this document were discussed and addressed.  A limited physical exam was performed with this format.  Please refer to the patient's chart for her consent to telehealth for [facility].  Date:  04/09/2023   ID:  Beverly Kim, DOB [birth year], MRN [id number]  Patient Location: Home Provider Location: Office  PCP:  Geofm Glade PARAS, MD  Cardiologist:  Alm Clay, MD  Electrophysiologist:  None   Evaluation Performed:  Follow-Up Visit  Chief Complaint:  routine follow-up of CAD, CHF, and atrial fibrillation  History of Present Illness:    Beverly Kim is a 89 y.o. female with a history of non-obstructive CAD on cardiac catheterization in 2003, chronic combined CHF with EF of 40-45% in 09/2022, permanent atrial fibrillation no longer on anticoagulation, complete heart block s/p St Jude PPM in 05/2016, LBBB severe mitral regurgitation, hypertension, hyperlipidemia, CKD stage III, GERD, anemia, and history of GI bleeds who is followed by Dr. Clay and presents today for routine follow-up.  Patient has a complex cardiac history as detailed above. Remote cardiac catheterization in 04/2001 showed moderate non-obstructive CAD with 50-70% stenosis of mid LAD. Last ischemic evaluation was a Myoview  in 2013 which was negative. He he has a history of complete heart block and underwent placement of a St. Jude pacemaker in 2018 as well as permanent atrial fibrillation and has declined anticoagulation. She also has a history of chronic combined CHF. Echo in 06/2021 showed a mild drop in EF to 40-45% with no regional wall motion abnormalities, mildly reduced RV function, severe biatrial enlargement, severe mitral regurgitation, and severe  tricuspid regurgitation. She was seen by Dr. Wonda but was not felt to be a candidate for MitraClip and focus as been on medical management.    She had multiple admission in 2024. She was admitted from 08/08/2022 to 09/07/2022 for a lower GI bleed. Colonoscopy revealed a hemorrhoid, multiple polyps in the transverse colon and cecum which were treated with cold snare, and 4 colonic angiodysplastic lesions that were treated with APC. Echo during admission showed LVEF of 40-45% with global hypokinesis and abnormal septal motion worse at the inferior base, normal RV size with moderate RV dysfunction, moderate biatrial enlargement, moderate to severe MR, and severe TR. She was admitted in 10/2022 for a femoral neck fracture after an unwitnessed fall and underwent a hemiarthroplasty. Hospitalization was complicated by hospital delirium vs sundowning. It looks like her Eliquis  was discontinued during this admission due to an intolerances and she subsequently decided to stop anticoagulation all together understanding the stroke risk with this. She was discharged to a SNF. She was most recently admitted from 12/07/2022 to 12/11/2022 with AKI and uremia felt to be secondary to dehydration after presenting with generalized weakness, poor PO intake, and confusion. BUN was 103 on arrival and creatinine was 1.49. Home Torsemide , Metolazone , and Spironolactone  were held and she was treated with IV fluids with improvement in BUN and creatinine.  Cardiology was consulted and recommended decreasing Torsemide  to 20mg  daily and holding Spironolactone  for a few days (instructed to restart Spironolactone  12.5mg  on 12/13/2022) and only using Metolazone  as needed. She was also found to have a UTI and was treated with antibiotics.   She was last  seen by me in 01/2023 for a video visit at which time she was doing better after Torsemide  was increased to twice daily and was no longer complaining of shortness of breath. Bisoprolol  was stopped due  to soft BP.   Patient presents today for a virtual visit because it is very difficult for granddaughter to get patient to appointments. Patient is hard of hearing so granddaughter Stoney Loving) assists with much of the visit. [name] states patient has been doing really well. She had the flu about 1 months but has recovered remarkably well. Patient has not been complaining of any shortness of breath or chest pain. She chronically sleeps on an incline but this is unchanged from baseline. No PND. She has some mild edema (mostly around ankles) if she has been sitting with her legs down for a while but this resolves with elevation. No palpitations or significant lightheadedness/ dizziness. No near syncope/ syncope. BP improved after stopping Bisoprolol .  Prior CV studies:    The following studies were reviewed:   Echocardiogram 09/04/2022: Impressions: 1. Global hypokinesis abnoraml septal motion worse at inferior base. Left  ventricular ejection fraction, by estimation, is 40 to 45%. The left  ventricle has mildly decreased function. The left ventricle demonstrates  global hypokinesis. The left  ventricular internal cavity size was mildly dilated. Left ventricular  diastolic parameters are indeterminate.   2. Device lead in RA/RV . Right ventricular systolic function is  moderately reduced. The right ventricular size is normal.   3. Left atrial size was moderately dilated.   4. Right atrial size was moderately dilated.   5. The mitral valve is normal in structure. Moderate to severe mitral  valve regurgitation. No evidence of mitral stenosis.   6. Cannot r/o device lead impingement on TV leaflets as cause for severe  TR No 3D short axis images done . Tricuspid valve regurgitation is severe.   7. The aortic valve is tricuspid. There is mild calcification of the  aortic valve. There is mild thickening of the aortic valve. Aortic valve  regurgitation is not visualized. Aortic valve sclerosis is  present, with  no evidence of aortic valve stenosis.   8. The inferior vena cava is dilated in size with <50% respiratory  variability, suggesting right atrial pressure of 15 mmHg.  Labs/Other Tests and Data Reviewed:    EKG:  Last EKG from 12/07/2022 showed V-paced rhythm.   Recent Labs: 09/03/2022: TSH 5.034 11/13/2022: B Natriuretic Peptide 1,168.1 12/08/2022: ALT 11 12/10/2022: BUN 38; Creatinine, Ser 0.99; Hemoglobin 12.2; Magnesium 2.4; Platelets 213; Potassium 3.8; Sodium 139   Recent Lipid Panel Lab Results  Component Value Date/Time   CHOL 132 05/08/2022 02:21 PM   TRIG 86.0 05/08/2022 02:21 PM   HDL 39.50 05/08/2022 02:21 PM   CHOLHDL 3 05/08/2022 02:21 PM   LDLCALC 75 05/08/2022 02:21 PM   LDLDIRECT 163.3 01/30/2012 10:16 AM    Wt Readings from Last 3 Encounters:  02/08/23 145 lb (65.8 kg)  01/21/23 145 lb (65.8 kg)  12/07/22 139 lb 12.4 oz (63.4 kg)     Objective:    Vital Signs:  BP 112/60 Comment: TAKEN WITH PT'S HOME HEALTH-PER GRAND-DAUGHTER   Vital Signs Reviewed. General: No acute distress. Pulm: No labored breathing. No coughing during visit. No audible wheezing. Speaking in full sentences. Neuro: Alert and oriented. No slurred speech. Answers questions appropriately. Psych: Pleasant affect.  ASSESSMENT & PLAN:    Chronic Combined CHF Patient has a history of chronic combined CHF. Last  Echo in 09/2022 showed LVEF 40-45% with global hypokinesis and abnormal septal motion worse at the inferior base, normal RV size with moderate RV dysfunction, moderate biatrial enlargement, moderate to severe MR, and severe TR.  - Unable to assess volume status given virtual visit. However, no obvious lower extremity (was able to see veins in legs).  - Continue Torsemide  20mg  twice daily.  - Continue Spironolactone  12.5mg  daily. - Previously on Bisoprolol  but this was stopped at last visit due to low BP.  - Will hold off on SGLT2 inhibitor given prior yeast infection.    Of note, home health RN has been unable to draw labs despite multiple attempts. Patient states she has always been a hard stick. Discussed importance of getting labs to help us  monitor her renal function given history of AKI and uremia. Family voiced understanding of this but states it is difficult to transport her to doctor's visits. I explained that I could reach out to our social worker to help assist with transportation but it also sounds like family is concerned it may be too taxing for the patient. They understand that she could possibly have worsening renal function and that there are often no physical signs of this until patient becomes uremic. They would like to continue with current plan.   Non-Obstructive CAD Remote cardiac catheterization in 2003 showed moderate non-obstructive CAD. Myoview  in 2013 was negative.  - No chest pain.  - She has declined a statin in the past.  - She has not been on Aspirin . Given her advanced age, will not be overly aggressive with this.    Permanent Atrial Fibrillation Recent device interrogation showed permanent atrial fibrillation.  - Unable to assess rate control given virtual visit. However, patient has not complained of any palpitations.  - She was previously on Coumadin  and more recently Eliquis  but felt poorly after starting Eliquis .She ultimately made the decision of stopping anticoagulation therapy all together knowing the increased stroke risk with this. This decision was based on long discussions patient and family had with Dr. Anner over the last couple of years.   Complete Heart Block S/p St Jude PPM in 05/2016. Recent device interrogation showed permanent atrial fibrillation.  - Followed by Dr. Nancey.    Moderate to Severe Mitral Regurgitation Severe Tricuspid Regurgitation Noted on recent Echo in 09/2022. Previously seen by Dr. Wonda and not felt to be a candidate for Mitraclip.  - Continue medical therapy. Continue diuresis for CHF  as above.    Hypertension BP well controlled.  - Continue Spironolactone  12.5mg  daily.    Hyperlipidemia - Patient has declined statins in the past.    CKD Stage III Recently admitted with AKI and uremia in 12/2022 in setting of dehydration and diuresis. However, renal function improved with IV fluids and holding of diuretics. Creatinine stable at 1.00 on 12/25/2022.  - We have unfortunately been unable to get any repeat labs since 12/2022. Please see note on this above.   Time:   Today, I have spent 17 minutes with the patient with telehealth technology discussing the above problems.     Medication Adjustments/Labs and Tests Ordered: Current medicines are reviewed at length with the patient today.  Concerns regarding medicines are outlined above.   Follow Up:  In 6 months (ideally this would be in person.   Signed, Aline FORBES Door, PA-C  04/09/2023 6:28 PM    [facility]

## 2023-04-08 ENCOUNTER — Telehealth: Payer: Self-pay

## 2023-04-08 NOTE — Patient Outreach (Signed)
  Care Coordination   04/08/2023 Name: Beverly Kim MRN: [id number] DOB: 1925-01-20   Care Coordination Outreach Attempts:  A second unsuccessful outreach was attempted today to offer the patient with information about available complex care management services.  Follow Up Plan:  Additional outreach attempts will be made to offer the patient complex care management information and services.   Encounter Outcome:  No Answer   Care Coordination Interventions:  No, not indicated    Kathyrn Sheriff, RN, MSN, BSN, CCM [place]  [facility], Population Health Case Manager Phone: [phone]

## 2023-04-09 ENCOUNTER — Ambulatory Visit: Payer: PPO | Attending: Student | Admitting: Student

## 2023-04-09 ENCOUNTER — Other Ambulatory Visit: Payer: Self-pay | Admitting: Nurse Practitioner

## 2023-04-09 ENCOUNTER — Encounter: Payer: Self-pay | Admitting: Student

## 2023-04-09 VITALS — BP 112/60

## 2023-04-09 DIAGNOSIS — I5042 Chronic combined systolic (congestive) and diastolic (congestive) heart failure: Secondary | ICD-10-CM

## 2023-04-09 DIAGNOSIS — I10 Essential (primary) hypertension: Secondary | ICD-10-CM

## 2023-04-09 DIAGNOSIS — J40 Bronchitis, not specified as acute or chronic: Secondary | ICD-10-CM

## 2023-04-09 DIAGNOSIS — I2510 Atherosclerotic heart disease of native coronary artery without angina pectoris: Secondary | ICD-10-CM

## 2023-04-09 DIAGNOSIS — I442 Atrioventricular block, complete: Secondary | ICD-10-CM

## 2023-04-09 DIAGNOSIS — I071 Rheumatic tricuspid insufficiency: Secondary | ICD-10-CM | POA: Diagnosis not present

## 2023-04-09 DIAGNOSIS — E785 Hyperlipidemia, unspecified: Secondary | ICD-10-CM

## 2023-04-09 DIAGNOSIS — I4821 Permanent atrial fibrillation: Secondary | ICD-10-CM | POA: Diagnosis not present

## 2023-04-09 DIAGNOSIS — N183 Chronic kidney disease, stage 3 unspecified: Secondary | ICD-10-CM | POA: Diagnosis not present

## 2023-04-09 DIAGNOSIS — I340 Nonrheumatic mitral (valve) insufficiency: Secondary | ICD-10-CM | POA: Diagnosis not present

## 2023-04-09 MED ORDER — BISOPROLOL FUMARATE 5 MG PO TABS: 2.5000 mg | ORAL_TABLET | Freq: Every day | ORAL | Status: DC

## 2023-04-09 NOTE — Patient Instructions (Signed)
Medication Instructions:  The current medical regimen is effective;  continue present plan and medications as directed. Please refer to the Current Medication list given to you today.  *If you need a refill on your cardiac medications before your next appointment, please call your pharmacy*  Lab Work: NONE If you have labs (blood work) drawn today and your tests are completely normal, you will receive your results only by:  MyChart Message (if you have MyChart) OR  A paper copy in the mail If you have any lab test that is abnormal or we need to change your treatment, we will call you to review the results.  Testing/Procedures: NONE  Follow-Up: At [facility], you and your health needs are our priority.  As part of our continuing mission to provide you with exceptional heart care, we have created designated Provider Care Teams.  These Care Teams include your primary Cardiologist (physician) and Advanced Practice Providers (APPs -  Physician Assistants and Nurse Practitioners) who all work together to provide you with the care you need, when you need it.  Your next appointment:   6 month(s)-HOPEFULLY IN PERSON  Provider:   Bryan Lemma, MD  or Marjie Skiff, PA-C        Other Instructions

## 2023-04-10 ENCOUNTER — Telehealth: Payer: Self-pay | Admitting: Internal Medicine

## 2023-04-10 NOTE — Telephone Encounter (Signed)
 Copied from CRM [id number]. Topic: Clinical - Home Health Verbal Orders >> Apr 10, 2023  2:58 PM Kara C wrote: Caller/Agency: Kate with [facility] Callback Number: [phone] Service Requested: Physical Therapy Frequency: Twice a week for 4 weeks and once a week for 4 weeks Any new concerns about the patient? Yes, patient has recovered from the flu but it has made her weaker.

## 2023-04-10 NOTE — Telephone Encounter (Signed)
 Okay for orders?

## 2023-04-11 NOTE — Telephone Encounter (Signed)
 Message left for [place] today with verbals.

## 2023-04-12 DIAGNOSIS — G9341 Metabolic encephalopathy: Secondary | ICD-10-CM | POA: Diagnosis not present

## 2023-04-12 DIAGNOSIS — R531 Weakness: Secondary | ICD-10-CM | POA: Diagnosis not present

## 2023-04-12 DIAGNOSIS — I5042 Chronic combined systolic (congestive) and diastolic (congestive) heart failure: Secondary | ICD-10-CM | POA: Diagnosis not present

## 2023-04-15 ENCOUNTER — Other Ambulatory Visit: Payer: Self-pay | Admitting: Internal Medicine

## 2023-04-19 ENCOUNTER — Other Ambulatory Visit: Payer: Self-pay | Admitting: Nurse Practitioner

## 2023-04-25 ENCOUNTER — Telehealth: Payer: Self-pay

## 2023-04-25 NOTE — Patient Outreach (Signed)
  Care Coordination   04/25/2023 Name: Beverly Kim MRN: [id number] DOB: [birth year]   Care Coordination Outreach Attempts:  A third unsuccessful outreach was attempted today to offer the patient with information about available complex care management services.  Follow Up Plan:  No further outreach attempts will be made at this time. We have been unable to contact the patient to offer or enroll patient in complex care management services.  Encounter Outcome:  No Answer   Care Coordination Interventions:  No, not indicated    Kathyrn Sheriff, RN, MSN, BSN, CCM [place]  [facility], Population Health Case Manager Phone: [phone]

## 2023-05-02 ENCOUNTER — Telehealth: Payer: Self-pay | Admitting: Internal Medicine

## 2023-05-02 NOTE — Telephone Encounter (Signed)
 Copied from CRM [id number]. Topic: Clinical - Home Health Verbal Orders >> May 02, 2023 10:49 AM Almira Coaster wrote: Caller/Agency: [facility] Callback Number: [phone] Service Requested: Discharged order, patient states she no longer wants nursing. Frequency: N/A Any new concerns about the patient? No

## 2023-05-02 NOTE — Telephone Encounter (Signed)
 Noted.

## 2023-05-10 DIAGNOSIS — R531 Weakness: Secondary | ICD-10-CM | POA: Diagnosis not present

## 2023-05-10 DIAGNOSIS — I5042 Chronic combined systolic (congestive) and diastolic (congestive) heart failure: Secondary | ICD-10-CM | POA: Diagnosis not present

## 2023-05-10 DIAGNOSIS — G9341 Metabolic encephalopathy: Secondary | ICD-10-CM | POA: Diagnosis not present

## 2023-05-23 ENCOUNTER — Other Ambulatory Visit: Payer: Self-pay | Admitting: Nurse Practitioner

## 2023-05-23 ENCOUNTER — Telehealth: Payer: Self-pay

## 2023-05-23 NOTE — Telephone Encounter (Signed)
 Copied from CRM [id number]. Topic: Clinical - Home Health Verbal Orders >> May 23, 2023  2:38 PM Shelbie Proctor wrote:  [facility] physical therapist [phone] asking for physical therapy for patient: twice a week for 5 weeks and taper down to one time a week for 3 weeks.

## 2023-05-24 NOTE — Telephone Encounter (Signed)
 Verbals given to JoJo this morning.

## 2023-05-24 NOTE — Telephone Encounter (Signed)
 Okay for orders?

## 2023-05-29 ENCOUNTER — Other Ambulatory Visit (HOSPITAL_COMMUNITY): Payer: Self-pay

## 2023-06-10 DIAGNOSIS — I5042 Chronic combined systolic (congestive) and diastolic (congestive) heart failure: Secondary | ICD-10-CM | POA: Diagnosis not present

## 2023-06-10 DIAGNOSIS — G9341 Metabolic encephalopathy: Secondary | ICD-10-CM | POA: Diagnosis not present

## 2023-06-10 DIAGNOSIS — R531 Weakness: Secondary | ICD-10-CM | POA: Diagnosis not present

## 2023-06-17 DIAGNOSIS — E1122 Type 2 diabetes mellitus with diabetic chronic kidney disease: Secondary | ICD-10-CM | POA: Diagnosis not present

## 2023-06-17 DIAGNOSIS — K5730 Diverticulosis of large intestine without perforation or abscess without bleeding: Secondary | ICD-10-CM | POA: Diagnosis not present

## 2023-06-17 DIAGNOSIS — J45991 Cough variant asthma: Secondary | ICD-10-CM | POA: Diagnosis not present

## 2023-06-17 DIAGNOSIS — I495 Sick sinus syndrome: Secondary | ICD-10-CM | POA: Diagnosis not present

## 2023-06-17 DIAGNOSIS — I5042 Chronic combined systolic (congestive) and diastolic (congestive) heart failure: Secondary | ICD-10-CM | POA: Diagnosis not present

## 2023-06-17 DIAGNOSIS — I130 Hypertensive heart and chronic kidney disease with heart failure and stage 1 through stage 4 chronic kidney disease, or unspecified chronic kidney disease: Secondary | ICD-10-CM | POA: Diagnosis not present

## 2023-06-17 DIAGNOSIS — I2510 Atherosclerotic heart disease of native coronary artery without angina pectoris: Secondary | ICD-10-CM | POA: Diagnosis not present

## 2023-06-17 DIAGNOSIS — H906 Mixed conductive and sensorineural hearing loss, bilateral: Secondary | ICD-10-CM | POA: Diagnosis not present

## 2023-06-17 DIAGNOSIS — E1140 Type 2 diabetes mellitus with diabetic neuropathy, unspecified: Secondary | ICD-10-CM | POA: Diagnosis not present

## 2023-06-17 DIAGNOSIS — N1832 Chronic kidney disease, stage 3b: Secondary | ICD-10-CM | POA: Diagnosis not present

## 2023-06-17 DIAGNOSIS — I480 Paroxysmal atrial fibrillation: Secondary | ICD-10-CM | POA: Diagnosis not present

## 2023-06-17 DIAGNOSIS — I779 Disorder of arteries and arterioles, unspecified: Secondary | ICD-10-CM | POA: Diagnosis not present

## 2023-06-26 ENCOUNTER — Other Ambulatory Visit: Payer: Self-pay | Admitting: Nurse Practitioner

## 2023-07-10 DIAGNOSIS — G9341 Metabolic encephalopathy: Secondary | ICD-10-CM | POA: Diagnosis not present

## 2023-07-10 DIAGNOSIS — I5042 Chronic combined systolic (congestive) and diastolic (congestive) heart failure: Secondary | ICD-10-CM | POA: Diagnosis not present

## 2023-07-10 DIAGNOSIS — R531 Weakness: Secondary | ICD-10-CM | POA: Diagnosis not present

## 2023-07-24 ENCOUNTER — Telehealth: Payer: Self-pay

## 2023-07-24 NOTE — Telephone Encounter (Signed)
 Verbals given today.

## 2023-07-24 NOTE — Telephone Encounter (Signed)
 Copied from CRM [id number]. Topic: Clinical - Home Health Verbal Orders >> Jul 24, 2023  8:13 AM Varney Gentleman wrote: Caller/Agency: Jill/[facility] Callback Number: [phone] Secure Line Service Requested: Physical Therapy Frequency: 1 week for 8 weeks Any new concerns about the patient? No

## 2023-07-24 NOTE — Telephone Encounter (Signed)
 Okay for orders?

## 2023-08-01 ENCOUNTER — Other Ambulatory Visit: Payer: Self-pay | Admitting: Internal Medicine

## 2023-08-02 ENCOUNTER — Encounter: Payer: Self-pay | Admitting: Internal Medicine

## 2023-08-02 ENCOUNTER — Other Ambulatory Visit: Payer: Self-pay | Admitting: Internal Medicine

## 2023-08-02 MED ORDER — PREGABALIN 25 MG PO CAPS: 25.0000 mg | ORAL_CAPSULE | Freq: Two times a day (BID) | ORAL | 0 refills | Status: DC

## 2023-08-09 ENCOUNTER — Telehealth: Admitting: Family Medicine

## 2023-08-09 ENCOUNTER — Ambulatory Visit: Payer: Self-pay

## 2023-08-09 DIAGNOSIS — A419 Sepsis, unspecified organism: Secondary | ICD-10-CM | POA: Diagnosis not present

## 2023-08-09 DIAGNOSIS — N179 Acute kidney failure, unspecified: Secondary | ICD-10-CM | POA: Diagnosis not present

## 2023-08-09 DIAGNOSIS — M25552 Pain in left hip: Secondary | ICD-10-CM | POA: Diagnosis not present

## 2023-08-09 DIAGNOSIS — L03116 Cellulitis of left lower limb: Secondary | ICD-10-CM | POA: Diagnosis not present

## 2023-08-09 DIAGNOSIS — R600 Localized edema: Secondary | ICD-10-CM | POA: Diagnosis not present

## 2023-08-09 DIAGNOSIS — L0390 Cellulitis, unspecified: Secondary | ICD-10-CM | POA: Diagnosis not present

## 2023-08-09 DIAGNOSIS — L089 Local infection of the skin and subcutaneous tissue, unspecified: Secondary | ICD-10-CM | POA: Diagnosis not present

## 2023-08-09 NOTE — Patient Instructions (Signed)

## 2023-08-09 NOTE — Telephone Encounter (Signed)
 Spoke with Beverly Kim and she scheduled VV through [facility].

## 2023-08-09 NOTE — Telephone Encounter (Signed)
She needs to be seen in person.

## 2023-08-09 NOTE — Progress Notes (Signed)
 Virtual Visit Consent   Beverly Kim, you are scheduled for a virtual visit with a [facility] provider today. Just as with appointments in the office, your consent must be obtained to participate. Your consent will be active for this visit and any virtual visit you may have with one of our providers in the next 365 days. If you have a MyChart account, a copy of this consent can be sent to you electronically.  As this is a virtual visit, video technology does not allow for your provider to perform a traditional examination. This may limit your provider's ability to fully assess your condition. If your provider identifies any concerns that need to be evaluated in person or the need to arrange testing (such as labs, EKG, etc.), we will make arrangements to do so. Although advances in technology are sophisticated, we cannot ensure that it will always work on either your end or our end. If the connection with a video visit is poor, the visit may have to be switched to a telephone visit. With either a video or telephone visit, we are not always able to ensure that we have a secure connection.  By engaging in this virtual visit, you consent to the provision of healthcare and authorize for your insurance to be billed (if applicable) for the services provided during this visit. Depending on your insurance coverage, you may receive a charge related to this service.  I need to obtain your verbal consent now. Are you willing to proceed with your visit today? Beverly Kim has provided verbal consent on 08/09/2023 for a virtual visit (video or telephone). Beverly Huger, FNP  Date: 08/09/2023 5:59 PM   Virtual Visit via Video Note   I, Beverly Kim, connected with  Beverly Kim  ([id number], 07/19/1924) on 08/09/23 at  5:30 PM EDT by a video-enabled telemedicine application and verified that I am speaking with the correct person using two identifiers.  Location: Patient: Virtual Visit Location Patient:  Home Provider: Virtual Visit Location Provider: Home Office   I discussed the limitations of evaluation and management by telemedicine and the availability of in person appointments. The patient expressed understanding and agreed to proceed.    History of Present Illness: Beverly Kim is a 89 y.o. who identifies as a female who was assigned female at birth, and is being seen today for redness, raised, warmth to left anterior thigh from abdomen to just above knee. She is in a hospital bed and lives with her granddaughter. She is very tired. No fever. Says she does not feel right. Her granddaughter Beverly Kim says area has doubled in size in past couple of days. Beverly Kim says she refuses to go to ED. Aaron Aas  HPI: HPI  Problems:  Patient Active Problem List   Diagnosis Date Noted   Bronchitis 03/13/2023   Acute metabolic encephalopathy 12/07/2022   Physical deconditioning 11/22/2022   Intertrochanteric fx-closed, right, initial encounter (HCC) 10/09/2022   Fracture of femoral neck, right (HCC) 10/09/2022   Insomnia 09/12/2022   Acute cystitis 09/02/2022   Yeast infection of the skin 05/08/2022   Left upper arm pain 05/08/2022   Arm numbness 05/08/2022   Right wrist pain 05/08/2022   Right hand pain 05/08/2022   Goals of care, counseling/discussion 06/20/2021   Blurry vision 01/04/2021   Diabetic neuropathy (HCC) 06/29/2020   CKD stage 3a, GFR 45-59 ml/min (HCC) 06/28/2020   Rib pain on right side    GI bleed 05/31/2020   Cardiac pacemaker  09/17/2019   Preop cardiovascular exam 09/17/2019   Vertigo 09/12/2019   Unsteady gait when walking 09/12/2019   Dyshidrotic eczema 09/12/2019   Nausea 08/05/2019   Paroxysmal atrial fibrillation (HCC) 01/23/2019   Secondary hypercoagulable state (HCC) 01/23/2019   Fatigue 11/25/2018   Poor balance 10/22/2018   Bilateral hearing loss 10/21/2018   Carotid disease, bilateral (HCC) 04/08/2017   Chronic pain of both knees 04/08/2017   Non-rheumatic  mitral regurgitation 07/12/2016   Complete heart block (HCC) 05/14/2016   Diabetes (HCC) 01/05/2016   Degenerative disc disease, cervical 08/23/2015   Degenerative disc disease, lumbar 08/23/2015   Left rotator cuff tear arthropathy 07/12/2015   Left shoulder pain 04/06/2015   Frozen shoulder 01/03/2015   Cough variant asthma 12/08/2013   Upper airway cough syndrome 10/27/2013   Dyspnea 02/03/2013   GERD 07/06/2009   Diaphragmatic hernia 07/06/2009   Diverticulosis of colon 07/06/2009   HEPATIC CYST 07/06/2009   History of colonic polyps 07/06/2009   MIXED HEARING LOSS BILATERAL 12/13/2008   Essential hypertension 06/19/2008   MITRAL VALVE PROLAPSE 06/19/2008   LBBB (left bundle branch block) 06/19/2008   Hyperlipidemia 08/12/2007   Hypothyroidism 12/02/2006   Coronary artery disease involving native heart without angina pectoris 12/02/2006   Chronic combined systolic and diastolic CHF (congestive heart failure) (HCC) 12/02/2006    Allergies:  Allergies  Allergen Reactions   Adhesive [Tape] Itching, Dermatitis, Rash and Other (See Comments)    Blisters and "skin bubbles"   Ace Inhibitors Cough   Atorvastatin Other (See Comments)    REACTION: Reaction not known   Clindamycin Other (See Comments)    Unknown   Codeine Other (See Comments)    hallucinations    Latex Other (See Comments)    blisters   Shellfish Allergy Other (See Comments)    "gallbladder attack"   Simvastatin Other (See Comments)    fatigue   Penicillins Rash   Medications:  Current Outpatient Medications:    acetaminophen  (TYLENOL ) 325 MG tablet, Take 650 mg by mouth every 8 (eight) hours., Disp: , Rfl:    albuterol  (VENTOLIN  HFA) 108 (90 Base) MCG/ACT inhaler, TAKE 2 PUFFS BY MOUTH EVERY 6 HOURS AS NEEDED FOR WHEEZE OR SHORTNESS OF BREATH, Disp: 18 each, Rfl: 2   Nutritional Supplements (NUTRITIONAL DRINK PO), Take 120 mLs by mouth 2 (two) times daily., Disp: , Rfl:    pantoprazole  (PROTONIX ) 40 MG  tablet, TAKE 1 TABLET BY MOUTH EVERY DAY, Disp: 90 tablet, Rfl: 1   polyethylene glycol (MIRALAX  / GLYCOLAX ) 17 g packet, Take 17 g by mouth 2 (two) times daily., Disp: 28 each, Rfl: 0   pregabalin  (LYRICA ) 25 MG capsule, Take 1 capsule (25 mg total) by mouth 2 (two) times daily., Disp: 60 capsule, Rfl: 0   saccharomyces boulardii (FLORASTOR) 250 MG capsule, Take 1 capsule (250 mg total) by mouth 2 (two) times daily., Disp: 60 capsule, Rfl: 5   SENNA PO, Take 17.2 mg by mouth 2 (two) times daily., Disp: , Rfl:    spironolactone  (ALDACTONE ) 25 MG tablet, TAKE 1/2 TABLET BY MOUTH EVERY DAY, Disp: 45 tablet, Rfl: 1   SYNTHROID  112 MCG tablet, TAKE 1 TABLET BY MOUTH DAILY BEFORE BREAKFAST., Disp: 90 tablet, Rfl: 1   torsemide  (DEMADEX ) 20 MG tablet, TAKE 3 TABLETS BY MOUTH EVERY DAY (Patient taking differently: Take 40 mg by mouth 2 (two) times daily.), Disp: 270 tablet, Rfl: 1   traZODone  (DESYREL ) 50 MG tablet, Take 50 mg by mouth at bedtime.,  Disp: , Rfl:   Observations/Objective: Patient is well-developed, well-nourished in no acute distress.  Resting comfortably  at home.  Head is normocephalic, atraumatic.  No labored breathing.  Speech is clear and coherent with logical content.  Patient is alert and oriented at baseline.  Large are of redness, raised, left thigh- large area involved.   Assessment and Plan: 1. Cellulitis of left leg (Primary)  Discussed at length with her granddaughter. She will try to get her to go to ED for IV antibiotics with risk of sepsis,  death, loss of limb. Advised to call 911.   Follow Up Instructions: I discussed the assessment and treatment plan with the patient. The patient was provided an opportunity to ask questions and all were answered. The patient agreed with the plan and demonstrated an understanding of the instructions.  A copy of instructions were sent to the patient via MyChart unless otherwise noted below.     The patient was advised to call  back or seek an in-person evaluation if the symptoms worsen or if the condition fails to improve as anticipated.    Dago Jungwirth, FNP

## 2023-08-09 NOTE — Telephone Encounter (Signed)
 FYI Only or Action Required?: Action required by provider  Patient was last seen in primary care on 03/13/2023 by Zorita Hiss, NP. Called Nurse Triage reporting Skin redness. Symptoms began 2 days ago. Interventions attempted: Rest, hydration, or home remedies. Symptoms are: unchanged.  Triage Disposition: See HCP Within 4 Hours (Or PCP Triage)  Patient/caregiver understands and will follow disposition?: No, wishes to speak with PCP  Copied from CRM [id number]. Topic: Clinical - Red Word Triage >> Aug 09, 2023 11:10 AM Luane Rumps D wrote: Red Word that prompted transfer to Nurse Triage: Patient is experiencing splotchy and red/raised under skin could be cellulitis on left thigh, been there for 2 days - applied ointment and it caused a reaction, it is now raised above the skin and warm to the touch. Along with this she has irritation under her breast. Reason for Disposition  [1] Looks infected (spreading redness, pus) AND [2] large red area (> 2 in. or 5 cm)  Answer Assessment - Initial Assessment Questions 1. APPEARANCE of RASH: "Describe the rash."      Redness or splotching to the skin 2. LOCATION: "Where is the rash located?"      Left thigh 3. NUMBER: "How many spots are there?"      No individual spots 4. SIZE: "How big are the spots?" (Inches, centimeters or compare to size of a coin)      N/A 5. ONSET: "When did the rash start?"      2 days 6. ITCHING: "Does the rash itch?" If Yes, ask: "How bad is the itch?"  (Scale 0-10; or none, mild, moderate, severe)     no 7. PAIN: "Does the rash hurt?" If Yes, ask: "How bad is the pain?"  (Scale 0-10; or none, mild, moderate, severe)    - NONE (0): no pain    - MILD (1-3): doesn't interfere with normal activities     - MODERATE (4-7): interferes with normal activities or awakens from sleep     - SEVERE (8-10): excruciating pain, unable to do any normal activities     no 8. OTHER SYMPTOMS: "Do you have any other symptoms?" (e.g., fever)     No  other symptoms.   Granddaughter reports patient stated that she felt off but couldn't say why she felt off.  Protocols used: Rash or Redness - Localized-A-AH

## 2023-08-10 DIAGNOSIS — Z950 Presence of cardiac pacemaker: Secondary | ICD-10-CM | POA: Diagnosis not present

## 2023-08-10 DIAGNOSIS — Z881 Allergy status to other antibiotic agents status: Secondary | ICD-10-CM | POA: Diagnosis not present

## 2023-08-10 DIAGNOSIS — Z91040 Latex allergy status: Secondary | ICD-10-CM | POA: Diagnosis not present

## 2023-08-10 DIAGNOSIS — I5042 Chronic combined systolic (congestive) and diastolic (congestive) heart failure: Secondary | ICD-10-CM | POA: Diagnosis not present

## 2023-08-10 DIAGNOSIS — M1990 Unspecified osteoarthritis, unspecified site: Secondary | ICD-10-CM | POA: Diagnosis not present

## 2023-08-10 DIAGNOSIS — L089 Local infection of the skin and subcutaneous tissue, unspecified: Secondary | ICD-10-CM | POA: Diagnosis not present

## 2023-08-10 DIAGNOSIS — L0390 Cellulitis, unspecified: Secondary | ICD-10-CM | POA: Diagnosis not present

## 2023-08-10 DIAGNOSIS — N179 Acute kidney failure, unspecified: Secondary | ICD-10-CM | POA: Diagnosis not present

## 2023-08-10 DIAGNOSIS — Z880 Allergy status to penicillin: Secondary | ICD-10-CM | POA: Diagnosis not present

## 2023-08-10 DIAGNOSIS — E039 Hypothyroidism, unspecified: Secondary | ICD-10-CM | POA: Diagnosis not present

## 2023-08-10 DIAGNOSIS — A419 Sepsis, unspecified organism: Secondary | ICD-10-CM | POA: Diagnosis not present

## 2023-08-10 DIAGNOSIS — Z79899 Other long term (current) drug therapy: Secondary | ICD-10-CM | POA: Diagnosis not present

## 2023-08-10 DIAGNOSIS — Z91013 Allergy to seafood: Secondary | ICD-10-CM | POA: Diagnosis not present

## 2023-08-10 DIAGNOSIS — K5909 Other constipation: Secondary | ICD-10-CM | POA: Diagnosis not present

## 2023-08-10 DIAGNOSIS — G9341 Metabolic encephalopathy: Secondary | ICD-10-CM | POA: Diagnosis not present

## 2023-08-10 DIAGNOSIS — I509 Heart failure, unspecified: Secondary | ICD-10-CM | POA: Diagnosis not present

## 2023-08-10 DIAGNOSIS — F419 Anxiety disorder, unspecified: Secondary | ICD-10-CM | POA: Diagnosis not present

## 2023-08-10 DIAGNOSIS — Z888 Allergy status to other drugs, medicaments and biological substances status: Secondary | ICD-10-CM | POA: Diagnosis not present

## 2023-08-10 DIAGNOSIS — L03116 Cellulitis of left lower limb: Secondary | ICD-10-CM | POA: Diagnosis not present

## 2023-08-10 DIAGNOSIS — E119 Type 2 diabetes mellitus without complications: Secondary | ICD-10-CM | POA: Diagnosis not present

## 2023-08-10 DIAGNOSIS — R600 Localized edema: Secondary | ICD-10-CM | POA: Diagnosis not present

## 2023-08-10 DIAGNOSIS — I110 Hypertensive heart disease with heart failure: Secondary | ICD-10-CM | POA: Diagnosis not present

## 2023-08-10 DIAGNOSIS — R531 Weakness: Secondary | ICD-10-CM | POA: Diagnosis not present

## 2023-08-10 DIAGNOSIS — K219 Gastro-esophageal reflux disease without esophagitis: Secondary | ICD-10-CM | POA: Diagnosis not present

## 2023-08-10 DIAGNOSIS — D696 Thrombocytopenia, unspecified: Secondary | ICD-10-CM | POA: Diagnosis not present

## 2023-08-10 DIAGNOSIS — F32A Depression, unspecified: Secondary | ICD-10-CM | POA: Diagnosis not present

## 2023-08-15 ENCOUNTER — Telehealth: Payer: Self-pay

## 2023-08-15 NOTE — Transitions of Care (Post Inpatient/ED Visit) (Signed)
   08/15/2023  Name: Beverly Kim MRN: [id number] DOB: 12-31-1924  Today's TOC FU Call Status: Today's TOC FU Call Status:: Unsuccessful Call (1st Attempt) Unsuccessful Call (1st Attempt) Date: 08/15/23  Attempted to reach the patient regarding the most recent Inpatient/ED visit.  Follow Up Plan: Additional outreach attempts will be made to reach the patient to complete the Transitions of Care (Post Inpatient/ED visit) call.   Gareld June, BSN, RN [place]  [facility] - [facility] RN Care Manager [phone]

## 2023-08-16 ENCOUNTER — Telehealth: Payer: Self-pay | Admitting: Internal Medicine

## 2023-08-16 ENCOUNTER — Telehealth: Payer: Self-pay

## 2023-08-16 ENCOUNTER — Other Ambulatory Visit: Payer: Self-pay

## 2023-08-16 NOTE — Telephone Encounter (Signed)
 Okay for orders?

## 2023-08-16 NOTE — Progress Notes (Signed)
 Spoke with Beverly Kim today.  Patient doing fine on antibiotics currently and she will follow up if patient's cellulitis doesn't get any better.

## 2023-08-16 NOTE — Telephone Encounter (Signed)
 Copied from CRM [id number]. Topic: General - Other >> Aug 16, 2023 12:19 PM Dorisann Garre T wrote: Reason for CRM: patient new Antibiotic  she is on and her  traZODone  (DESYREL ) 50 MG tablet [[id number]] is showing a hard red flag nurse victoria stated she did a clinical review on the patient and stated that the patient is now on Linezolid 600 mg twice daily   cb number is [phone]

## 2023-08-16 NOTE — Telephone Encounter (Signed)
 Copied from CRM [id number]. Topic: Clinical - Home Health Verbal Orders >> Aug 16, 2023  1:32 PM Alyse July wrote: Caller/Agency: [facility] Number: [phone] Service Requested: Physical Therapy Frequency: 1 time for recent hospital discharge. Any new concerns about the patient? No

## 2023-08-16 NOTE — Transitions of Care (Post Inpatient/ED Visit) (Signed)
   08/16/2023  Name: Beverly Kim MRN: [id number] DOB: April 17, 1924  Today's TOC FU Call Status: Today's TOC FU Call Status:: Successful TOC FU Call Completed TOC FU Call Complete Date: 08/16/23 Patient's Name and Date of Birth confirmed.  Transition Care Management Follow-up Telephone Call    Items Reviewed: Follow up on Medications adverse reaction warning     Medications Reviewed Today:  Medication follow up education review for Linezolid 600 mg 2 times daily and traZODone  50 mg at bedtime for adverse reaction warning and spoke with Beverly Kim to ensure information had been received.  Beverly Kim states she has received a call from MD office and traZODone  has been discontinued.   Brown Cape, RN, BSN, CCM [facility], [facility] RN Care Manager Direct Dial: [phone]

## 2023-08-16 NOTE — Telephone Encounter (Signed)
 Already addressed

## 2023-08-16 NOTE — Telephone Encounter (Signed)
 Copied from CRM [id number]. Topic: General - Other >> Aug 16, 2023 12:30 PM Dorisann Garre T wrote: Reason for CRM: NURSE VICTORIA IS CALLING IN REGARDING PATIENT MEDICATION trazondone AND HER NEW ANTIBIOTIC SHE WAS PUT ON  is showing a hard red flag patient was put on Linezolid 600 mg twice daily nurse victoria call back number is  [phone]

## 2023-08-16 NOTE — Transitions of Care (Post Inpatient/ED Visit) (Signed)
 08/16/2023  Name: Beverly Kim MRN: [id number] DOB: September 22, 1924  Today's TOC FU Call Status: Today's TOC FU Call Status:: Successful TOC FU Call Completed TOC FU Call Complete Date: 08/16/23 Patient's Name and Date of Birth confirmed.  Transition Care Management Follow-up Telephone Call Date of Discharge: 08/14/23 Discharge Facility: Other [facility]) Name of Other ([facility]) Discharge Facility: [facility] Type of Discharge: Inpatient Admission Primary Inpatient Discharge Diagnosis:: Cellulitis How have you been since you were released from the hospital?: Better Any questions or concerns?: No  Items Reviewed: Did you receive and understand the discharge instructions provided?: Yes Medications obtained,verified, and reconciled?: Yes (Medications Reviewed) Any new allergies since your discharge?: No Dietary orders reviewed?: Yes Do you have support at home?: Yes People in Home [RPT]: grandchild(ren) Name of Support/Comfort Primary Source: Beverly Kim - granddaughter (she lives with us )  Medications Reviewed Today:  Beverly Kim willing to review medications only today. Patient was prescribed Linezolid 600 mg 2 times a day per hospital [[place]] noted a caution for interaction with Trazadone 50 mg at bedtime.    **Called placed to MD office of clinician review of interaction warning and spoke with Beverly Kim to alert the clinical staff. Medications Reviewed Today     Reviewed by Beverly Mccoy, RN (Registered Nurse) on 08/16/23 at 1220  Med List Status: <None>   Medication Order Taking? Sig Documenting Provider Last Dose Status Informant  acetaminophen  (TYLENOL ) 325 MG tablet [id number]  Take 650 mg by mouth every 8 (eight) hours. [provider]  Active Child, Family Member  albuterol  (VENTOLIN  HFA) 108 (90 Base) MCG/ACT inhaler [id number]  TAKE 2 PUFFS BY MOUTH EVERY 6 HOURS AS NEEDED FOR WHEEZE OR SHORTNESS OF BREATH Burns, Beckey Bourgeois, MD  Active   linezolid  (ZYVOX) 600 MG tablet [id number] Yes Take 600 mg by mouth 2 (two) times daily. [provider]  Active   Nutritional Supplements (NUTRITIONAL DRINK PO) [id number]  Take 120 mLs by mouth 2 (two) times daily. [provider]  Active Child, Family Member  pantoprazole  (PROTONIX ) 40 MG tablet [id number]  TAKE 1 TABLET BY MOUTH EVERY DAY Burns, Beckey Bourgeois, MD  Active   polyethylene glycol (MIRALAX  / GLYCOLAX ) 17 g packet [id number]  Take 17 g by mouth 2 (two) times daily. Haydee Lipa, MD  Active Child, Family Member  pregabalin  (LYRICA ) 25 MG capsule [id number]  Take 1 capsule (25 mg total) by mouth 2 (two) times daily. Colene Dauphin, MD  Active   saccharomyces boulardii (FLORASTOR) 250 MG capsule [id number]  Take 1 capsule (250 mg total) by mouth 2 (two) times daily.  Patient not taking: Reported on 08/16/2023   Colene Dauphin, MD  Active   SENNA PO [id number]  Take 17.2 mg by mouth 2 (two) times daily. [provider]  Active   spironolactone  (ALDACTONE ) 25 MG tablet [id number]  TAKE 1/2 TABLET BY MOUTH EVERY DAY Colene Dauphin, MD  Active   SYNTHROID  112 MCG tablet [id number]  TAKE 1 TABLET BY MOUTH DAILY BEFORE BREAKFAST. Colene Dauphin, MD  Active   torsemide  (DEMADEX ) 20 MG tablet [id number]  TAKE 3 TABLETS BY MOUTH EVERY DAY  Patient taking differently: Take 40 mg by mouth 2 (two) times daily.   Colene Dauphin, MD  Active   traZODone  (DESYREL ) 50 MG tablet [id number]  Take 50 mg by mouth at bedtime. [provider]  Active  Follow up appointments reviewed: Offered to assist with TOC follow up appointment, Beverly Kim decline,states will call and follow up for appointment today.  Agrees to review medications only at this tiime.    Beverly Cape, RN, BSN, CCM [facility], [facility] RN Care Manager Direct Dial: [phone]

## 2023-08-16 NOTE — Telephone Encounter (Signed)
 Called to speak with nurse but she was busy on another call.  If she calls back okay to give her verbal order for PT.

## 2023-08-20 ENCOUNTER — Telehealth: Payer: Self-pay

## 2023-08-20 NOTE — Telephone Encounter (Signed)
 Copied from CRM [id number]. Topic: Clinical - Home Health Verbal Orders >> Aug 19, 2023  3:04 PM [place] wrote: resumption of care for [facility]- need to move it to Wednesday 6/18 to start for services  [phone] [facility]

## 2023-08-20 NOTE — Telephone Encounter (Signed)
 Noted

## 2023-08-21 ENCOUNTER — Telehealth: Payer: Self-pay

## 2023-08-21 NOTE — Telephone Encounter (Signed)
 Copied from CRM [id number]. Topic: Clinical - Home Health Verbal Orders >> Aug 21, 2023  2:46 PM Adonis Hoot wrote: Caller/Agency: [facility] Callback Number: [phone] Service Requested: Physical Therapy Frequency: 1wk4 Any new concerns about the patient? No Patient was recently discharged from hospital

## 2023-08-21 NOTE — Telephone Encounter (Signed)
 Okay for orders?

## 2023-08-22 NOTE — Telephone Encounter (Signed)
 Verbals given today.

## 2023-08-23 ENCOUNTER — Ambulatory Visit: Payer: Self-pay

## 2023-08-23 ENCOUNTER — Other Ambulatory Visit: Payer: Self-pay | Admitting: Internal Medicine

## 2023-08-23 MED ORDER — FLUCONAZOLE 150 MG PO TABS: 150.0000 mg | ORAL_TABLET | Freq: Once | ORAL | 0 refills | Status: AC

## 2023-08-23 NOTE — Telephone Encounter (Signed)
 I can send in 1 dose of fluconazole  to see if that helps.  I would like her to still keep the virtual visit since I have not seen her in a while and we can touch base.

## 2023-08-23 NOTE — Telephone Encounter (Signed)
 FYI Only or Action Required?: Action required by provider: update on patient condition.  Patient was last seen in primary care on 08/09/2023 by Blair, Diane W, FNP. Called Nurse Triage reporting Vaginitis. Symptoms began several days ago. Interventions attempted: Prescription medications: Nystatin  Powder. Symptoms are: gradually worsening.  Triage Disposition: See PCP When Office is Open (Within 3 Days)  Patient/caregiver understands and will follow disposition?: No, wishes to speak with PCP  **Grand daughter Shelvy Dickens would like something called in for increasing yeast on patients body, pt. Was seen in the hospital a week ago, and was prescribed ZYVOX. Pt. Does have an upcomming virtual visit on 6/25 with PCP**                     Copied from CRM #[id number]. Topic: Clinical - Red Word Triage >> Aug 23, 2023  1:20 PM Chuck Crater wrote: [facility] that prompted transfer to Nurse Triage: [name] stated that linezolid (ZYVOX) 600 MG tablet is causing her to have a lot of yeast. Patient has yeast in the back of throat, in her vaginal area, thigh, under arm and breast. She stated that the medication that was previously prescribed did not work as well. She is having alot of pain in those areas from it. Reason for Disposition  [1] Symptoms of a yeast infection (i.e., itchy, white discharge, not bad smelling) AND [2] not improved > 3 days following Care Advice  Answer Assessment - Initial Assessment Questions 1. SYMPTOM: What's the main symptom you're concerned about? (e.g., pain, itching, dryness)  Yeast from Zyvox       2. LOCATION: Where is the  Yeast located? (e.g., inside/outside, left/right)     Throat, mouth, white pates on throat/mouth, under breast, folds of stomach, under left arm, vaginal area, anal area, inner thighs.    3. ONSET: When did the  symptoms start?     Last Wedneday. Last dose was yesterday.   4. PAIN: Is there any pain? If Yes, ask: How bad is  it? (Scale: 1-10; mild, moderate, severe)   -  MILD (1-3): Doesn't interfere with normal activities.    -  MODERATE (4-7): Interferes with normal activities (e.g., work or school) or awakens from sleep.     -  SEVERE (8-10): Excruciating pain, unable to do any normal activities.     Mild, uncomfortable pain   5. ITCHING: Is there any itching? If Yes, ask: How bad is it? (Scale: 1-10; mild, moderate, severe)     Vaginal area, no burning with urination, swollen vulva area.   6. CAUSE: What do you think is causing the discharge? Have you had the same problem before? What happened then?     Yes, has yeast infections before 7. OTHER SYMPTOMS: Do you have any other symptoms? (e.g., fever, itching, vaginal bleeding, pain with urination, injury to genital area, vaginal foreign body)     Itching, no other symptoms noted.    Nystatin  powder seems to irritate the symptoms more. Family would like something called in for the symptoms until patients follow up appointment on 6/25.  Protocols used: Vaginal Symptoms-A-AH

## 2023-08-24 ENCOUNTER — Other Ambulatory Visit: Payer: Self-pay | Admitting: Internal Medicine

## 2023-08-24 DIAGNOSIS — R1011 Right upper quadrant pain: Secondary | ICD-10-CM

## 2023-08-24 DIAGNOSIS — K219 Gastro-esophageal reflux disease without esophagitis: Secondary | ICD-10-CM

## 2023-08-26 ENCOUNTER — Encounter: Payer: Self-pay | Admitting: Internal Medicine

## 2023-08-26 NOTE — Progress Notes (Unsigned)
 Virtual Visit via Video Note  I connected with Beverly Kim on 08/26/23 at 11:00 AM EDT by a video enabled telemedicine application and verified that I am speaking with the correct person using two identifiers.   I discussed the limitations of evaluation and management by telemedicine and the availability of in person appointments. The patient expressed understanding and agreed to proceed.  Present for the visit:  Myself, Dr Glade Hope, Beverly Kim and her granddaughter [name].  The patient is currently at home and I am in the office.    No referring provider.    History of Present Illness: This visit is for follow up of her chronic medical conditions.  [name] provides most of the history.  Cellulitis left leg-did a virtual visit 6/6.  She was advised to go to the emergency room for possible IV antibiotics.  She was placed on linezolid 600 mg twice daily.  This was causing some stomach upset, but that has resolved after completing the antibiotic.   Noticed that she had some increasing yeast infections in her skin and also had some white spots on her tongue.  She was experiencing decreased appetite and more fatigue.  Did prescribe 1 fluconazole  and that did help-[name] has noticed improved appetite, skin yeast infections are getting better and her tongue looks better.  She did have the beginning of a sacral pressure ulcer when she was in the hospital and had a bandage on it.  She did react to the bandage.  Her granddaughter has been applying a barrier cream and watching it carefully.  She does have chronic pain which is stable.  She is in the hospital bed most of the day which provides some comfort.  She will be starting PT this Friday     Review of Systems  Constitutional:  Negative for fever.  Respiratory:  Negative for cough, shortness of breath and wheezing.   Cardiovascular:  Negative for chest pain, palpitations and leg swelling.  Gastrointestinal:  Negative for abdominal  pain, constipation and diarrhea (loose).  Neurological:  Negative for dizziness and headaches.  Psychiatric/Behavioral:  Negative for depression.        Sleeping more       Social History   Socioeconomic History   Marital status: Widowed    Spouse name: Not on file   Number of children: 4   Years of education: Not on file   Highest education level: Not on file  Occupational History   Occupation: retired  Tobacco Use   Smoking status: Never   Smokeless tobacco: Never  Vaping Use   Vaping status: Never Used  Substance and Sexual Activity   Alcohol  use: No   Drug use: No   Sexual activity: Never  Other Topics Concern   Not on file  Social History Narrative   Lives with granddaughter Stoney)   Social Drivers of Health   Financial Resource Strain: Low Risk  (09/11/2019)   Overall Financial Resource Strain (CARDIA)    Difficulty of Paying Living Expenses: Not hard at all  Food Insecurity: No Food Insecurity (12/07/2022)   Hunger Vital Sign    Worried About Running Out of Food in the Last Year: Never true    Ran Out of Food in the Last Year: Never true  Transportation Needs: No Transportation Needs (02/08/2023)   PRAPARE - Administrator, Civil Service (Medical): No    Lack of Transportation (Non-Medical): No  Physical Activity: Insufficiently Active (02/08/2023)   Exercise Vital Sign  Days of Exercise per Week: 2 days    Minutes of Exercise per Session: 60 min  Stress: No Stress Concern Present (02/08/2023)   [employer] of Occupational Health - Occupational Stress Questionnaire    Feeling of Stress : Not at all  Social Connections: Socially Isolated (02/08/2023)   Social Connection and Isolation Panel    Frequency of Communication with Friends and Family: More than three times a week    Frequency of Social Gatherings with Friends and Family: Twice a week    Attends Religious Services: Never    Database administrator or Organizations: No    Attends Occupational hygienist Meetings: Never    Marital Status: Widowed     Observations/Objective: Appears elderly and frail She is not in any acute distress   Assessment and Plan:  See Problem List for Assessment and Plan of chronic medical problems.   Follow Up Instructions:    I discussed the assessment and treatment plan with the patient. The patient was provided an opportunity to ask questions and all were answered. The patient agreed with the plan and demonstrated an understanding of the instructions.   The patient was advised to call back or seek an in-person evaluation if the symptoms worsen or if the condition fails to improve as anticipated.    Glade JINNY Hope, MD

## 2023-08-26 NOTE — Telephone Encounter (Signed)
 Message left for Beverly Kim today to keep appointment for tomorrow.

## 2023-08-26 NOTE — Assessment & Plan Note (Signed)
 Chronic No longer taking trazodone 

## 2023-08-26 NOTE — Patient Instructions (Signed)
 Beverly Kim

## 2023-08-27 ENCOUNTER — Telehealth (INDEPENDENT_AMBULATORY_CARE_PROVIDER_SITE_OTHER): Admitting: Internal Medicine

## 2023-08-27 DIAGNOSIS — B372 Candidiasis of skin and nail: Secondary | ICD-10-CM

## 2023-08-27 DIAGNOSIS — K219 Gastro-esophageal reflux disease without esophagitis: Secondary | ICD-10-CM

## 2023-08-27 DIAGNOSIS — I5042 Chronic combined systolic (congestive) and diastolic (congestive) heart failure: Secondary | ICD-10-CM | POA: Diagnosis not present

## 2023-08-27 DIAGNOSIS — G4709 Other insomnia: Secondary | ICD-10-CM | POA: Diagnosis not present

## 2023-08-27 DIAGNOSIS — I2510 Atherosclerotic heart disease of native coronary artery without angina pectoris: Secondary | ICD-10-CM | POA: Diagnosis not present

## 2023-08-27 DIAGNOSIS — E038 Other specified hypothyroidism: Secondary | ICD-10-CM | POA: Diagnosis not present

## 2023-08-27 DIAGNOSIS — E1142 Type 2 diabetes mellitus with diabetic polyneuropathy: Secondary | ICD-10-CM | POA: Diagnosis not present

## 2023-08-27 DIAGNOSIS — E1122 Type 2 diabetes mellitus with diabetic chronic kidney disease: Secondary | ICD-10-CM | POA: Diagnosis not present

## 2023-08-27 DIAGNOSIS — N1831 Chronic kidney disease, stage 3a: Secondary | ICD-10-CM | POA: Diagnosis not present

## 2023-08-27 DIAGNOSIS — I10 Essential (primary) hypertension: Secondary | ICD-10-CM

## 2023-08-27 DIAGNOSIS — B370 Candidal stomatitis: Secondary | ICD-10-CM | POA: Insufficient documentation

## 2023-08-27 MED ORDER — CLOTRIMAZOLE 1 % EX CREA: 1.0000 | TOPICAL_CREAM | Freq: Two times a day (BID) | CUTANEOUS | Status: DC

## 2023-08-27 MED ORDER — FLUCONAZOLE 150 MG PO TABS: 150.0000 mg | ORAL_TABLET | ORAL | 0 refills | Status: DC

## 2023-08-27 NOTE — Assessment & Plan Note (Signed)
 Chronic Stable

## 2023-08-27 NOTE — Assessment & Plan Note (Signed)
 Chronic Looks and sounds euvolemic Following with cardiology Continue torsemide  60 mg daily

## 2023-08-27 NOTE — Assessment & Plan Note (Signed)
 Chronic GERD controlled Continue pantoprazole 40 mg daily

## 2023-08-27 NOTE — Assessment & Plan Note (Signed)
 Chronic In multiple areas Worse recently after antibiotics for cellulitis Improved with Diflucan  150 mg x 1-will prescribe an additional 3 doses every 72 hours for her skin and for thrush to make sure there is complete resolution Continue clotrimazole  cream twice daily as needed

## 2023-08-27 NOTE — Assessment & Plan Note (Signed)
 Chronic No chest pain, no change in chronic SOB Continue torsemide , spironolactone  Following with cardiology

## 2023-08-27 NOTE — Assessment & Plan Note (Signed)
 Acute Secondary to recent antibiotics Improved with Diflucan  150 mg x 1 Still may have some symptoms/evidence of thrush Fluconazole  150 mg every 72 hours x 3 doses

## 2023-08-27 NOTE — Assessment & Plan Note (Signed)
 Chronic Needs TSH with next blood draw Lab Results  Component Value Date   TSH 5.034 (H) 09/03/2022  Continue Synthroid  112 mcg daily

## 2023-08-27 NOTE — Assessment & Plan Note (Signed)
Chronic   Lab Results  Component Value Date   HGBA1C 6.7 (H) 05/08/2022   Sugars controlled Continue diet control

## 2023-08-27 NOTE — Assessment & Plan Note (Signed)
 Chronic Continue spironolactone  12.5 mg daily

## 2023-08-27 NOTE — Assessment & Plan Note (Signed)
 Chronic Continue pregabalin  25 mg twice daily Sugars well-controlled

## 2023-09-13 ENCOUNTER — Other Ambulatory Visit: Payer: Self-pay | Admitting: Internal Medicine

## 2023-09-13 DIAGNOSIS — I5042 Chronic combined systolic (congestive) and diastolic (congestive) heart failure: Secondary | ICD-10-CM | POA: Diagnosis not present

## 2023-09-13 DIAGNOSIS — G9341 Metabolic encephalopathy: Secondary | ICD-10-CM | POA: Diagnosis not present

## 2023-09-13 DIAGNOSIS — R531 Weakness: Secondary | ICD-10-CM | POA: Diagnosis not present

## 2023-09-16 ENCOUNTER — Ambulatory Visit

## 2023-09-16 DIAGNOSIS — I442 Atrioventricular block, complete: Secondary | ICD-10-CM

## 2023-09-16 LAB — CUP PACEART REMOTE DEVICE CHECK
Battery Remaining Longevity: 20 mo
Battery Remaining Percentage: 18 %
Battery Voltage: 2.87 V
Brady Statistic AP VP Percent: 11 %
Brady Statistic AP VS Percent: 1 %
Brady Statistic AS VP Percent: 88 %
Brady Statistic AS VS Percent: 1.1 %
Brady Statistic RA Percent Paced: 14 %
Brady Statistic RV Percent Paced: 99 %
Date Time Interrogation Session: 20250714020015
Implantable Lead Connection Status: 753985
Implantable Lead Connection Status: 753985
Implantable Lead Implant Date: 20180313
Implantable Lead Implant Date: 20180313
Implantable Lead Location: 753859
Implantable Lead Location: 753860
Implantable Pulse Generator Implant Date: 20180313
Lead Channel Impedance Value: 380 Ohm
Lead Channel Impedance Value: 380 Ohm
Lead Channel Pacing Threshold Amplitude: 1.125 V
Lead Channel Pacing Threshold Amplitude: 1.25 V
Lead Channel Pacing Threshold Pulse Width: 0.5 ms
Lead Channel Pacing Threshold Pulse Width: 0.5 ms
Lead Channel Sensing Intrinsic Amplitude: 0.3 mV
Lead Channel Sensing Intrinsic Amplitude: 7.5 mV
Lead Channel Setting Pacing Amplitude: 1.375
Lead Channel Setting Pacing Amplitude: 2.5 V
Lead Channel Setting Pacing Pulse Width: 0.5 ms
Lead Channel Setting Sensing Sensitivity: 4 mV
Pulse Gen Model: 2272
Pulse Gen Serial Number: 8005625

## 2023-09-18 ENCOUNTER — Telehealth: Payer: Self-pay | Admitting: Internal Medicine

## 2023-09-18 NOTE — Telephone Encounter (Signed)
 Copied from CRM [id number]. Topic: Clinical - Home Health Verbal Orders >> Sep 18, 2023  8:09 AM Thersia BROCKS wrote: Caller/Agency: Kate Rushing Number: [phone] Service Requested: Physical Therapy Frequency: move appointment from this week to next week, patient is unavailable  Any new concerns about the patient? No

## 2023-09-18 NOTE — Telephone Encounter (Signed)
 Spoke with Jill today

## 2023-09-24 ENCOUNTER — Telehealth: Payer: Self-pay

## 2023-09-24 NOTE — Telephone Encounter (Signed)
 Copied from CRM [id number]. Topic: Clinical - Home Health Verbal Orders >> Sep 24, 2023  8:08 AM Burnard DEL wrote:  Caller/Agency: Jill/[facility] Callback Number: [phone] Service Requested: Physical Therapy Frequency: 1wk8 Any new concerns about the patient? No

## 2023-09-25 ENCOUNTER — Ambulatory Visit: Payer: Self-pay | Admitting: Cardiovascular Disease

## 2023-09-25 NOTE — Telephone Encounter (Signed)
 Message left today for [facility] with verbals.

## 2023-09-30 DIAGNOSIS — I5042 Chronic combined systolic (congestive) and diastolic (congestive) heart failure: Secondary | ICD-10-CM | POA: Diagnosis not present

## 2023-09-30 DIAGNOSIS — N1832 Chronic kidney disease, stage 3b: Secondary | ICD-10-CM | POA: Diagnosis not present

## 2023-09-30 DIAGNOSIS — I495 Sick sinus syndrome: Secondary | ICD-10-CM | POA: Diagnosis not present

## 2023-09-30 DIAGNOSIS — I779 Disorder of arteries and arterioles, unspecified: Secondary | ICD-10-CM | POA: Diagnosis not present

## 2023-09-30 DIAGNOSIS — E1140 Type 2 diabetes mellitus with diabetic neuropathy, unspecified: Secondary | ICD-10-CM | POA: Diagnosis not present

## 2023-09-30 DIAGNOSIS — E1122 Type 2 diabetes mellitus with diabetic chronic kidney disease: Secondary | ICD-10-CM | POA: Diagnosis not present

## 2023-09-30 DIAGNOSIS — I2510 Atherosclerotic heart disease of native coronary artery without angina pectoris: Secondary | ICD-10-CM | POA: Diagnosis not present

## 2023-09-30 DIAGNOSIS — I130 Hypertensive heart and chronic kidney disease with heart failure and stage 1 through stage 4 chronic kidney disease, or unspecified chronic kidney disease: Secondary | ICD-10-CM | POA: Diagnosis not present

## 2023-09-30 DIAGNOSIS — I480 Paroxysmal atrial fibrillation: Secondary | ICD-10-CM | POA: Diagnosis not present

## 2023-09-30 DIAGNOSIS — J45991 Cough variant asthma: Secondary | ICD-10-CM | POA: Diagnosis not present

## 2023-09-30 DIAGNOSIS — H906 Mixed conductive and sensorineural hearing loss, bilateral: Secondary | ICD-10-CM | POA: Diagnosis not present

## 2023-10-11 ENCOUNTER — Encounter: Payer: Self-pay | Admitting: Emergency Medicine

## 2023-10-14 DIAGNOSIS — R531 Weakness: Secondary | ICD-10-CM | POA: Diagnosis not present

## 2023-10-14 DIAGNOSIS — G9341 Metabolic encephalopathy: Secondary | ICD-10-CM | POA: Diagnosis not present

## 2023-10-14 DIAGNOSIS — I5042 Chronic combined systolic (congestive) and diastolic (congestive) heart failure: Secondary | ICD-10-CM | POA: Diagnosis not present

## 2023-10-19 ENCOUNTER — Other Ambulatory Visit: Payer: Self-pay | Admitting: Internal Medicine

## 2023-11-14 DIAGNOSIS — R531 Weakness: Secondary | ICD-10-CM | POA: Diagnosis not present

## 2023-11-14 DIAGNOSIS — I5042 Chronic combined systolic (congestive) and diastolic (congestive) heart failure: Secondary | ICD-10-CM | POA: Diagnosis not present

## 2023-11-14 DIAGNOSIS — G9341 Metabolic encephalopathy: Secondary | ICD-10-CM | POA: Diagnosis not present

## 2023-11-25 ENCOUNTER — Other Ambulatory Visit: Payer: Self-pay | Admitting: Internal Medicine

## 2023-12-12 NOTE — Progress Notes (Signed)
 Remote PPM Transmission

## 2023-12-14 DIAGNOSIS — R531 Weakness: Secondary | ICD-10-CM | POA: Diagnosis not present

## 2023-12-14 DIAGNOSIS — I5042 Chronic combined systolic (congestive) and diastolic (congestive) heart failure: Secondary | ICD-10-CM | POA: Diagnosis not present

## 2023-12-14 DIAGNOSIS — G9341 Metabolic encephalopathy: Secondary | ICD-10-CM | POA: Diagnosis not present

## 2023-12-16 ENCOUNTER — Encounter

## 2023-12-22 ENCOUNTER — Other Ambulatory Visit: Payer: Self-pay | Admitting: Internal Medicine

## 2024-01-07 ENCOUNTER — Ambulatory Visit: Payer: Self-pay

## 2024-01-07 ENCOUNTER — Other Ambulatory Visit: Payer: Self-pay | Admitting: Internal Medicine

## 2024-01-07 DIAGNOSIS — N183 Chronic kidney disease, stage 3 unspecified: Secondary | ICD-10-CM

## 2024-01-07 DIAGNOSIS — I480 Paroxysmal atrial fibrillation: Secondary | ICD-10-CM

## 2024-01-07 DIAGNOSIS — I5042 Chronic combined systolic (congestive) and diastolic (congestive) heart failure: Secondary | ICD-10-CM

## 2024-01-07 DIAGNOSIS — I2510 Atherosclerotic heart disease of native coronary artery without angina pectoris: Secondary | ICD-10-CM

## 2024-01-07 MED ORDER — ALPRAZOLAM 0.25 MG PO TABS: 0.2500 mg | ORAL_TABLET | Freq: Three times a day (TID) | ORAL | Status: DC | PRN

## 2024-01-07 MED ORDER — TRAZODONE HCL 50 MG PO TABS: 25.0000 mg | ORAL_TABLET | Freq: Every evening | ORAL | 3 refills | Status: DC | PRN

## 2024-01-07 MED ORDER — ALPRAZOLAM 0.25 MG PO TABS: 0.2500 mg | ORAL_TABLET | Freq: Three times a day (TID) | ORAL | 1 refills | Status: DC | PRN

## 2024-01-07 NOTE — Telephone Encounter (Signed)
 Rx's sent in.

## 2024-01-07 NOTE — Addendum Note (Signed)
 Addended by: GEOFM GLADE PARAS on: 01/07/2024 03:45 PM   Modules accepted: Orders

## 2024-01-07 NOTE — Telephone Encounter (Signed)
 This RN contacted [facility] d/t ED refusal and was advised that nursing staff would reach out to granddaughter. Her number is [phone].  Would also like to discuss finding resources for hospice or palliative care.   Advised to call 911 if pt worsens, made aware that they always have the right to refuse transport if preferred. Recommended she also contact patient's cardiologist for advisement.  FYI Only or Action Required?: Action required by provider: clinical question for provider and update on patient condition.  Patient was last seen in primary care on 08/27/2023 by Geofm Glade PARAS, MD.  Called Nurse Triage reporting Breathing Problem.  Symptoms began a week ago.  Interventions attempted: Prescription medications: torsemide  and Rest, hydration, or home remedies.  Symptoms are: gradually worsening.  Triage Disposition: Go to ED Now (Notify PCP)  Patient/caregiver understands and will follow disposition?: No, refuses disposition  Reason for Disposition  [1] MODERATE difficulty breathing (e.g., speaks in phrases, SOB even at rest, pulse 100-120) AND [2] NEW-onset or WORSE than normal  Answer Assessment - Initial Assessment Questions Pt's granddaughter reports her grandmother is c/o SOB, weakness and anxiety gradually over the past week. States her SPO2 is 97%. States in the past year, this has occurred intermittently d/t CHF. Taking torsemide  BID, increased to 3 times daily. States she is moaning and crying, asking verbalizes Jesus, take me. Granddaughter has begun to look into hospice. Pt refuses to go to the ED and is CAOx4. Would like to discuss antianxiety medications as well, states experienced diarrhea with zoloft  and pt is already incontinent.  Pt's granddaughter refuses ED/EMS recommendation, states they would prefer to consider hospice or do telehealth visit.   This RN contacted [facility] and was advised that nursing staff would reach out to granddaughter. Her number is  [phone].  1. RESPIRATORY STATUS: Describe your breathing? (e.g., wheezing, shortness of breath, unable to speak, severe coughing)      SOB 2. ONSET: When did this breathing problem begin?      One week 3. PATTERN Does the difficult breathing come and go, or has it been constant since it started?      Constant 4. SEVERITY: How bad is your breathing? (e.g., mild, moderate, severe)      Mild 5. RECURRENT SYMPTOM: Have you had difficulty breathing before? If Yes, ask: When was the last time? and What happened that time?      Intermittent over the past year 6. CARDIAC HISTORY: Do you have any history of heart disease? (e.g., heart attack, angina, bypass surgery, angioplasty)      Has pacemaker and heart failure 7. LUNG HISTORY: Do you have any history of lung disease?  (e.g., pulmonary embolus, asthma, emphysema)     Denies (other than heart failure) 10. O2 SATURATION MONITOR:  Do you use an oxygen  saturation monitor (pulse oximeter) at home? If Yes, ask: What is your reading (oxygen  level) today? What is your usual oxygen  saturation reading? (e.g., 95%)       97%  Protocols used: Breathing Difficulty-A-AH  Copied from CRM #[id number]. Topic: Clinical - Red Word Triage >> Jan 07, 2024  9:22 AM Eva FALCON wrote: Red Word that prompted transfer to Nurse Triage: Granddaughter is calling in, states grandmother is having trouble breathing and has intensified throughout the days, is very anxious to the point where she is asking to die, is refusing going to the hospital, granddaughter is not sure really what to do.  Past Medical History:  Diagnosis Date   Acute blood loss  anemia 09/04/2022   Cardiac pacemaker 05/2016   Sick sinus syndrome/complete heart block   Chronic heart failure with preserved ejection fraction (HFpEF) (HCC)    Colon polyps    Diverticulosis    Dyspnea    GERD (gastroesophageal reflux disease)    Guaiac positive stools 09/04/2022   Hearing loss of  both ears    Hepatic cyst    Hiatal hernia    Hyperlipidemia    Hypertension    Hypothyroidism    Low back pain    Non-occlusive coronary artery disease 04/2001   Cardiac Cath 04/18/2001: 50 and 70% mid LAD after D1. = Nonischemic by Myoview .  Medical management.   Other left bundle branch block    PAF (paroxysmal atrial fibrillation) (HCC) 08/16/2016   observed on PPM interrogation, asymptomatic, chad2vasc score is 5.  Bisoprolol  for rate control, Xarelto  for anticoagulation.   Rectal bleeding 09/04/2022   URI (upper respiratory infection)

## 2024-01-07 NOTE — Telephone Encounter (Signed)
 Referral for hospice ordered.    Until hospice can come and see her - I can prescribe some xanax for now if she wants.

## 2024-01-22 ENCOUNTER — Other Ambulatory Visit: Payer: Self-pay | Admitting: Internal Medicine

## 2024-02-03 DEATH — deceased

## 2024-03-16 ENCOUNTER — Encounter

## 2024-06-15 ENCOUNTER — Encounter

## 2024-09-14 ENCOUNTER — Encounter

## 2024-12-14 ENCOUNTER — Encounter
